# Patient Record
Sex: Male | Born: 1945 | Race: White | Hispanic: No | Smoking: Former smoker
Health system: Southern US, Community
[De-identification: ages and names within clinical notes are randomized; demographics above are authoritative.]

## PROBLEM LIST (undated history)

## (undated) DIAGNOSIS — C801 Malignant (primary) neoplasm, unspecified: Secondary | ICD-10-CM

## (undated) DIAGNOSIS — K5730 Diverticulosis of large intestine without perforation or abscess without bleeding: Secondary | ICD-10-CM

## (undated) DIAGNOSIS — K219 Gastro-esophageal reflux disease without esophagitis: Secondary | ICD-10-CM

## (undated) DIAGNOSIS — F32A Depression, unspecified: Secondary | ICD-10-CM

## (undated) DIAGNOSIS — H409 Unspecified glaucoma: Secondary | ICD-10-CM

## (undated) DIAGNOSIS — F329 Major depressive disorder, single episode, unspecified: Secondary | ICD-10-CM

## (undated) DIAGNOSIS — G4700 Insomnia, unspecified: Secondary | ICD-10-CM

## (undated) DIAGNOSIS — C4491 Basal cell carcinoma of skin, unspecified: Secondary | ICD-10-CM

## (undated) DIAGNOSIS — H269 Unspecified cataract: Secondary | ICD-10-CM

## (undated) DIAGNOSIS — K602 Anal fissure, unspecified: Secondary | ICD-10-CM

## (undated) DIAGNOSIS — K449 Diaphragmatic hernia without obstruction or gangrene: Secondary | ICD-10-CM

## (undated) DIAGNOSIS — T7840XA Allergy, unspecified, initial encounter: Secondary | ICD-10-CM

## (undated) DIAGNOSIS — K589 Irritable bowel syndrome without diarrhea: Secondary | ICD-10-CM

## (undated) DIAGNOSIS — F419 Anxiety disorder, unspecified: Secondary | ICD-10-CM

## (undated) DIAGNOSIS — K648 Other hemorrhoids: Secondary | ICD-10-CM

## (undated) HISTORY — PX: WRIST SURGERY: SHX841

## (undated) HISTORY — PX: BASAL CELL CARCINOMA EXCISION: SHX1214

## (undated) HISTORY — DX: Insomnia, unspecified: G47.00

## (undated) HISTORY — DX: Unspecified cataract: H26.9

## (undated) HISTORY — DX: Diverticulosis of large intestine without perforation or abscess without bleeding: K57.30

## (undated) HISTORY — DX: Anal fissure, unspecified: K60.2

## (undated) HISTORY — DX: Irritable bowel syndrome, unspecified: K58.9

## (undated) HISTORY — DX: Basal cell carcinoma of skin, unspecified: C44.91

## (undated) HISTORY — PX: TONSILLECTOMY AND ADENOIDECTOMY: SUR1326

## (undated) HISTORY — PX: UPPER GASTROINTESTINAL ENDOSCOPY: SHX188

## (undated) HISTORY — DX: Anxiety disorder, unspecified: F41.9

## (undated) HISTORY — DX: Major depressive disorder, single episode, unspecified: F32.9

## (undated) HISTORY — DX: Other hemorrhoids: K64.8

## (undated) HISTORY — DX: Unspecified glaucoma: H40.9

## (undated) HISTORY — DX: Depression, unspecified: F32.A

## (undated) HISTORY — DX: Gastro-esophageal reflux disease without esophagitis: K21.9

## (undated) HISTORY — DX: Diaphragmatic hernia without obstruction or gangrene: K44.9

## (undated) HISTORY — PX: APPENDECTOMY: SHX54

## (undated) HISTORY — DX: Allergy, unspecified, initial encounter: T78.40XA

## (undated) HISTORY — DX: Malignant (primary) neoplasm, unspecified: C80.1

---

## 1995-03-23 HISTORY — PX: ANAL SPHINCTEROTOMY: SHX1140

## 1999-04-23 HISTORY — PX: ESOPHAGOGASTRODUODENOSCOPY: SHX1529

## 1999-04-23 HISTORY — PX: COLONOSCOPY: SHX174

## 2001-05-22 ENCOUNTER — Encounter: Payer: Self-pay | Admitting: Family Medicine

## 2001-05-22 LAB — CONVERTED CEMR LAB: PSA: 2 ng/mL

## 2002-07-22 ENCOUNTER — Encounter: Payer: Self-pay | Admitting: Family Medicine

## 2002-07-22 LAB — CONVERTED CEMR LAB: PSA: 1.5 ng/mL

## 2003-06-06 ENCOUNTER — Encounter: Payer: Self-pay | Admitting: Family Medicine

## 2003-06-06 ENCOUNTER — Encounter: Admission: RE | Admit: 2003-06-06 | Discharge: 2003-06-06 | Payer: Self-pay | Admitting: Family Medicine

## 2003-07-23 ENCOUNTER — Encounter: Payer: Self-pay | Admitting: Family Medicine

## 2003-07-23 LAB — CONVERTED CEMR LAB: PSA: 1.8 ng/mL

## 2003-10-13 ENCOUNTER — Encounter: Payer: Self-pay | Admitting: Internal Medicine

## 2004-08-09 ENCOUNTER — Ambulatory Visit: Payer: Self-pay | Admitting: Family Medicine

## 2004-08-24 ENCOUNTER — Encounter: Admission: RE | Admit: 2004-08-24 | Discharge: 2004-08-24 | Payer: Self-pay | Admitting: Orthopedic Surgery

## 2004-09-27 HISTORY — PX: BICEPT TENODESIS: SHX5116

## 2004-11-20 ENCOUNTER — Encounter: Payer: Self-pay | Admitting: Family Medicine

## 2004-12-02 ENCOUNTER — Ambulatory Visit: Payer: Self-pay | Admitting: Family Medicine

## 2004-12-08 ENCOUNTER — Ambulatory Visit: Payer: Self-pay | Admitting: Family Medicine

## 2006-04-11 ENCOUNTER — Ambulatory Visit: Payer: Self-pay | Admitting: Internal Medicine

## 2006-04-22 ENCOUNTER — Encounter: Payer: Self-pay | Admitting: Family Medicine

## 2006-05-03 ENCOUNTER — Ambulatory Visit: Payer: Self-pay | Admitting: Family Medicine

## 2006-05-08 ENCOUNTER — Ambulatory Visit: Payer: Self-pay | Admitting: Family Medicine

## 2006-05-22 ENCOUNTER — Encounter: Payer: Self-pay | Admitting: Family Medicine

## 2006-05-22 LAB — CONVERTED CEMR LAB: PSA: 1.97 ng/mL

## 2006-05-24 ENCOUNTER — Ambulatory Visit: Payer: Self-pay | Admitting: Family Medicine

## 2006-05-26 ENCOUNTER — Ambulatory Visit: Payer: Self-pay | Admitting: Internal Medicine

## 2006-06-08 ENCOUNTER — Ambulatory Visit: Payer: Self-pay | Admitting: Family Medicine

## 2006-11-21 ENCOUNTER — Ambulatory Visit: Payer: Self-pay | Admitting: Family Medicine

## 2006-11-29 ENCOUNTER — Ambulatory Visit: Payer: Self-pay | Admitting: Family Medicine

## 2006-12-08 ENCOUNTER — Ambulatory Visit: Payer: Self-pay | Admitting: Family Medicine

## 2006-12-22 ENCOUNTER — Ambulatory Visit: Payer: Self-pay | Admitting: Family Medicine

## 2006-12-23 ENCOUNTER — Encounter: Payer: Self-pay | Admitting: Family Medicine

## 2006-12-23 DIAGNOSIS — K5730 Diverticulosis of large intestine without perforation or abscess without bleeding: Secondary | ICD-10-CM | POA: Insufficient documentation

## 2006-12-23 DIAGNOSIS — K589 Irritable bowel syndrome without diarrhea: Secondary | ICD-10-CM | POA: Insufficient documentation

## 2006-12-23 DIAGNOSIS — E782 Mixed hyperlipidemia: Secondary | ICD-10-CM

## 2007-01-03 ENCOUNTER — Ambulatory Visit: Payer: Self-pay | Admitting: Family Medicine

## 2007-01-18 ENCOUNTER — Ambulatory Visit: Payer: Self-pay | Admitting: Family Medicine

## 2007-01-23 ENCOUNTER — Ambulatory Visit: Payer: Self-pay | Admitting: Internal Medicine

## 2007-01-23 LAB — CONVERTED CEMR LAB
Basophils Absolute: 0.1 10*3/uL (ref 0.0–0.1)
Basophils Relative: 1.3 % — ABNORMAL HIGH (ref 0.0–1.0)
Creatinine, Ser: 1 mg/dL (ref 0.4–1.5)
Crystals: NEGATIVE
HCT: 42.8 % (ref 39.0–52.0)
Hemoglobin: 15.1 g/dL (ref 13.0–17.0)
MCHC: 35.3 g/dL (ref 30.0–36.0)
Neutrophils Relative %: 70.8 % (ref 43.0–77.0)
RBC: 4.85 M/uL (ref 4.22–5.81)
RDW: 13.3 % (ref 11.5–14.6)
Specific Gravity, Urine: <=1.005 (ref 1.000–1.03)
Total Protein, Urine: NEGATIVE mg/dL
WBC: 8.5 10*3/uL (ref 4.5–10.5)
pH: 6 (ref 5.0–8.0)

## 2007-01-24 ENCOUNTER — Ambulatory Visit (HOSPITAL_COMMUNITY): Admission: RE | Admit: 2007-01-24 | Discharge: 2007-01-24 | Payer: Self-pay | Admitting: Internal Medicine

## 2007-02-06 ENCOUNTER — Ambulatory Visit: Payer: Self-pay | Admitting: Family Medicine

## 2007-02-06 ENCOUNTER — Telehealth (INDEPENDENT_AMBULATORY_CARE_PROVIDER_SITE_OTHER): Payer: Self-pay | Admitting: *Deleted

## 2007-02-06 LAB — CONVERTED CEMR LAB
Bilirubin Urine: NEGATIVE
Specific Gravity, Urine: <1.005
Urobilinogen, UA: 0.2
WBC Urine, dipstick: NEGATIVE
pH: 6

## 2007-02-08 ENCOUNTER — Ambulatory Visit: Payer: Self-pay | Admitting: Internal Medicine

## 2007-06-21 ENCOUNTER — Ambulatory Visit: Payer: Self-pay | Admitting: Family Medicine

## 2007-06-21 LAB — CONVERTED CEMR LAB
Chloride: 103 meq/L (ref 96–112)
Eosinophils Absolute: 0.1 10*3/uL (ref 0.0–0.6)
Eosinophils Relative: 1.7 % (ref 0.0–5.0)
GFR calc non Af Amer: 80 mL/min
Glucose, Bld: 79 mg/dL (ref 70–99)
HCT: 42.5 % (ref 39.0–52.0)
Hemoglobin: 14.5 g/dL (ref 13.0–17.0)
Lymphocytes Relative: 23.1 % (ref 12.0–46.0)
MCV: 92.7 fL (ref 78.0–100.0)
Neutro Abs: 5.3 10*3/uL (ref 1.4–7.7)
Neutrophils Relative %: 67.5 % (ref 43.0–77.0)
RBC: 4.59 M/uL (ref 4.22–5.81)
Sodium: 142 meq/L (ref 135–145)
TSH: 1.05 microintl units/mL (ref 0.35–5.50)
WBC: 7.8 10*3/uL (ref 4.5–10.5)

## 2007-06-22 ENCOUNTER — Encounter: Payer: Self-pay | Admitting: Family Medicine

## 2007-06-25 ENCOUNTER — Ambulatory Visit: Payer: Self-pay | Admitting: Family Medicine

## 2007-07-20 ENCOUNTER — Ambulatory Visit: Payer: Self-pay | Admitting: Family Medicine

## 2007-07-20 LAB — FECAL OCCULT BLOOD, GUAIAC: Fecal Occult Blood: NEGATIVE

## 2007-07-23 ENCOUNTER — Encounter (INDEPENDENT_AMBULATORY_CARE_PROVIDER_SITE_OTHER): Payer: Self-pay | Admitting: *Deleted

## 2007-12-15 DIAGNOSIS — K449 Diaphragmatic hernia without obstruction or gangrene: Secondary | ICD-10-CM | POA: Insufficient documentation

## 2007-12-15 DIAGNOSIS — K319 Disease of stomach and duodenum, unspecified: Secondary | ICD-10-CM

## 2007-12-15 DIAGNOSIS — F411 Generalized anxiety disorder: Secondary | ICD-10-CM | POA: Insufficient documentation

## 2007-12-15 DIAGNOSIS — Z9089 Acquired absence of other organs: Secondary | ICD-10-CM

## 2007-12-24 ENCOUNTER — Ambulatory Visit: Payer: Self-pay | Admitting: Family Medicine

## 2007-12-25 ENCOUNTER — Encounter: Payer: Self-pay | Admitting: Family Medicine

## 2008-03-03 ENCOUNTER — Ambulatory Visit: Payer: Self-pay | Admitting: Family Medicine

## 2008-06-30 ENCOUNTER — Ambulatory Visit: Payer: Self-pay | Admitting: Family Medicine

## 2008-07-01 LAB — CONVERTED CEMR LAB
AST: 21 units/L (ref 0–37)
Alkaline Phosphatase: 46 units/L (ref 39–117)
Basophils Absolute: 0 10*3/uL (ref 0.0–0.1)
Chloride: 105 meq/L (ref 96–112)
Eosinophils Absolute: 0.2 10*3/uL (ref 0.0–0.7)
Eosinophils Relative: 3 % (ref 0.0–5.0)
GFR calc non Af Amer: 80 mL/min
HDL: 33.2 mg/dL — ABNORMAL LOW (ref >39.0)
MCHC: 34.2 g/dL (ref 30.0–36.0)
MCV: 92.5 fL (ref 78.0–100.0)
Neutrophils Relative %: 66.6 % (ref 43.0–77.0)
Platelets: 236 10*3/uL (ref 150–400)
Potassium: 4.4 meq/L (ref 3.5–5.1)
RDW: 13.2 % (ref 11.5–14.6)
Sodium: 141 meq/L (ref 135–145)
Total Bilirubin: 0.7 mg/dL (ref 0.3–1.2)
VLDL: 14 mg/dL (ref 0–40)
WBC: 6.9 10*3/uL (ref 4.5–10.5)

## 2008-07-02 ENCOUNTER — Ambulatory Visit: Payer: Self-pay | Admitting: Family Medicine

## 2008-07-03 ENCOUNTER — Encounter: Payer: Self-pay | Admitting: Family Medicine

## 2008-07-04 ENCOUNTER — Encounter: Payer: Self-pay | Admitting: Family Medicine

## 2008-09-02 ENCOUNTER — Encounter: Payer: Self-pay | Admitting: Family Medicine

## 2008-09-02 HISTORY — PX: MOHS SURGERY: SUR867

## 2008-10-08 ENCOUNTER — Encounter (INDEPENDENT_AMBULATORY_CARE_PROVIDER_SITE_OTHER): Payer: Self-pay | Admitting: *Deleted

## 2008-11-17 ENCOUNTER — Telehealth: Payer: Self-pay | Admitting: Family Medicine

## 2008-11-27 ENCOUNTER — Ambulatory Visit: Payer: Self-pay | Admitting: Internal Medicine

## 2008-12-11 ENCOUNTER — Ambulatory Visit: Payer: Self-pay | Admitting: Internal Medicine

## 2008-12-12 ENCOUNTER — Telehealth: Payer: Self-pay | Admitting: Internal Medicine

## 2008-12-31 ENCOUNTER — Telehealth: Payer: Self-pay | Admitting: Family Medicine

## 2009-01-01 ENCOUNTER — Ambulatory Visit: Payer: Self-pay | Admitting: Family Medicine

## 2009-01-01 LAB — CONVERTED CEMR LAB
Bacteria, UA: 0
Bilirubin Urine: NEGATIVE
Glucose, Urine, Semiquant: NEGATIVE
Ketones, urine, test strip: NEGATIVE
Protein, U semiquant: NEGATIVE
RBC / HPF: 0
Urobilinogen, UA: 0.2
WBC, UA: 0 cells/hpf
pH: 6

## 2009-01-02 ENCOUNTER — Encounter (INDEPENDENT_AMBULATORY_CARE_PROVIDER_SITE_OTHER): Payer: Self-pay | Admitting: Internal Medicine

## 2009-05-02 DIAGNOSIS — C4491 Basal cell carcinoma of skin, unspecified: Secondary | ICD-10-CM

## 2009-05-02 HISTORY — DX: Basal cell carcinoma of skin, unspecified: C44.91

## 2009-05-11 ENCOUNTER — Encounter: Payer: Self-pay | Admitting: Family Medicine

## 2009-05-12 ENCOUNTER — Ambulatory Visit: Payer: Self-pay | Admitting: Family Medicine

## 2009-05-21 ENCOUNTER — Encounter: Payer: Self-pay | Admitting: Family Medicine

## 2009-05-24 ENCOUNTER — Telehealth: Payer: Self-pay | Admitting: Internal Medicine

## 2009-05-25 ENCOUNTER — Encounter: Payer: Self-pay | Admitting: Family Medicine

## 2009-06-22 ENCOUNTER — Ambulatory Visit: Payer: Self-pay | Admitting: Family Medicine

## 2009-06-22 DIAGNOSIS — R5381 Other malaise: Secondary | ICD-10-CM

## 2009-06-22 DIAGNOSIS — R5383 Other fatigue: Secondary | ICD-10-CM

## 2009-06-23 ENCOUNTER — Ambulatory Visit: Payer: Self-pay | Admitting: Family Medicine

## 2009-06-24 LAB — CONVERTED CEMR LAB
Albumin: 3.8 g/dL (ref 3.5–5.2)
Alkaline Phosphatase: 49 units/L (ref 39–117)
Basophils Absolute: 0 10*3/uL (ref 0.0–0.1)
Calcium: 8.8 mg/dL (ref 8.4–10.5)
Creatinine, Ser: 1.2 mg/dL (ref 0.4–1.5)
Eosinophils Absolute: 0.2 10*3/uL (ref 0.0–0.7)
Eosinophils Relative: 3.5 % (ref 0.0–5.0)
GFR calc non Af Amer: 64.61 mL/min (ref >60)
Glucose, Bld: 82 mg/dL (ref 70–99)
HCT: 42.9 % (ref 39.0–52.0)
HDL: 32.1 mg/dL — ABNORMAL LOW (ref >39.00)
Iron: 108 ug/dL (ref 42–165)
Lymphs Abs: 1.7 10*3/uL (ref 0.7–4.0)
MCHC: 34.2 g/dL (ref 30.0–36.0)
MCV: 93.6 fL (ref 78.0–100.0)
Monocytes Absolute: 0.5 10*3/uL (ref 0.1–1.0)
Neutrophils Relative %: 64.9 % (ref 43.0–77.0)
Platelets: 237 10*3/uL (ref 150.0–400.0)
RDW: 13.3 % (ref 11.5–14.6)
Sodium: 137 meq/L (ref 135–145)
TSH: 0.99 microintl units/mL (ref 0.35–5.50)
Total Protein: 6.6 g/dL (ref 6.0–8.3)
Triglycerides: 80 mg/dL (ref 0.0–149.0)
VLDL: 16 mg/dL (ref 0.0–40.0)
WBC: 7 10*3/uL (ref 4.5–10.5)

## 2009-07-06 ENCOUNTER — Ambulatory Visit: Payer: Self-pay | Admitting: Family Medicine

## 2009-07-06 ENCOUNTER — Telehealth: Payer: Self-pay | Admitting: Family Medicine

## 2009-07-06 DIAGNOSIS — E559 Vitamin D deficiency, unspecified: Secondary | ICD-10-CM | POA: Insufficient documentation

## 2009-11-25 ENCOUNTER — Telehealth: Payer: Self-pay | Admitting: Family Medicine

## 2010-01-29 ENCOUNTER — Ambulatory Visit: Payer: Self-pay | Admitting: Family Medicine

## 2010-03-25 ENCOUNTER — Encounter (INDEPENDENT_AMBULATORY_CARE_PROVIDER_SITE_OTHER): Payer: Self-pay | Admitting: *Deleted

## 2010-09-08 ENCOUNTER — Encounter: Payer: Self-pay | Admitting: Family Medicine

## 2010-09-21 NOTE — Letter (Signed)
Summary: Nadara Eaton letter  [place] [facility]  [address] [place], [place] [address]   Phone: [phone]  Fax: [phone]       03/25/2010 MRN: [id number]  JAHMAI FINELLI [address] [place], [place]  [address]  Dear Mr. [facility] [place] and [place] announce the retirement of Arta Silence, M.D., from full-time practice at the [facility] office effective February 18, 2010 and his plans of returning part-time.  It is important to Dr. Hetty Ely and to our practice that you understand that [facility] has seven physicians in our office for your health care needs.  We will continue to offer the same exceptional care that you have today.    Dr. Hetty Ely has spoken to many of you about his plans for retirement and returning part-time in the fall.   We will continue to work with you through the transition to schedule appointments for you in the office and meet the high standards that [facility] is committed to.   Again, it is with great pleasure that we share the news that Dr. Hetty Ely will return to [facility] at [facility] in October of 2011 with a reduced schedule.    If you have any questions, or would like to request an appointment with one of our physicians, please call us at [phone] and press the option for Scheduling an appointment.  We take pleasure in providing you with excellent patient care and look forward to seeing you at your next office visit.  Our [facility] Physicians are:  Tillman Abide, M.D. Laurita Quint, M.D. Roxy Manns, M.D. Kerby Nora, M.D. Hannah Beat, M.D. Ruthe Mannan, M.D. We proudly welcomed Raechel Ache, M.D. and Eustaquio Boyden, M.D. to the practice in July/August 2011.  Sincerely,  [facility]

## 2010-09-21 NOTE — Assessment & Plan Note (Signed)
Summary: COUGH,BODY ACHES,DRAINAGE/CLE   Vital Signs:  Patient profile:   65 year old male Height:      70 inches Weight:      167.0 pounds BMI:     24.05 Temp:     98.2 degrees F oral Pulse rate:   68 / minute Pulse rhythm:   regular BP sitting:   128 / 80  (left arm) Cuff size:   regular  Vitals Entered By: Benny Lennert CMA Duncan Dull) (January 29, 2010 3:30 PM)  History of Present Illness: Chief complaint cough,body ache,and drainage   HAs quit quit smoking cigars.   On  allegra for allergies. Remotely used nasal steroid , minimal response.   Acute Visit History:      The patient complains of cough, nasal discharge, and sinus problems.  These symptoms began 5 days ago.  He denies chest pain, earache, fever, and headache.  Other comments include: body ache, fatigue post nasal drip  .        The character of the cough is described as nonproductive.  There is no history of wheezing or sleep interference associated with his cough.        He complains of sinus pressure, teeth aching, and ears being blocked.        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 1 Daily By Mouth 2)  Viagra 100 Mg Tabs (Sildenafil Citrate) .Marland Kitchen.. 1 Hour Before Sexual Activvity 3)  Fexofenadine Hcl 180 Mg Tabs (Fexofenadine Hcl) .Marland Kitchen.. 1 Daily By Mouth As Needed 4)  Azithromycin 250 Mg Tabs (Azithromycin) .... 2 Tab By Mouth X 1 Then 1 Tab By Mouth Daily  Fill If Not Improving in 20-4 Days, Void After 02/19/2010 5)  Fluticasone Propionate 50 Mcg/act Susp (Fluticasone Propionate) .... 2 Spray Per Nostrl Daily  Allergies: 1)  ! Penicillin V Potassium (Penicillin V Potassium) 2)  ! Erythromycin Base (Erythromycin Base) 3)  ! Librax (Clidinium-Chlordiazepoxide) 4)  ! Levbid (Hyoscyamine Sulfate) 5)  ! Shrimp // Crab Information systems manager)  Past History:  Past medical, surgical, family and social histories (including risk factors) reviewed, and no changes noted (except as noted below).  Past Medical History: Reviewed history from 01/18/2007 and no changes required. Diverticulosis, colon  Past Surgical History: Reviewed history from 06/22/2009 and no changes required. R Wrist Surg 11yoa Tonsillectomy RemoteAppy Remote Anal Sphincterotomy Fistulotomy 03/1995 Colonoscopy Divertics Int Hemms  04/1999            5 EGD Gerd w/Esophagitis and Stricture 04/1999 R Shoulder Arthgroscopy/Decompr  (Dr August Saucer)  09/27/2004 Colonoscopy Divertics  Int Hemms   10/13/2003       10 BCC Excision  L Supratip  of Nose with flap ([facility]) 09/02/2008 Colonoscopy Severe Divertics/Sig No Polyps Int Hemms  (Dr Perry)--12/11/2008--next in 5 yrs  Family History: Reviewed history from 07/06/2009 and no changes required. Father dec 92  Pneumonia Mother dec 92 Stroke HTN Sister A 70  Back trouble Chronic pain  Social History: Reviewed history from 12/22/2006 and no changes required. Occupation: [employer]  Retired 1997 Married 21+ yrs Divorced 1997   1 son Alcohol use-yes Drug use-no Former Smoker, cigar  Review of Systems General:  Denies fatigue  and fever. CV:  Denies chest pain or discomfort. Resp:  Denies shortness of breath. GI:  Denies abdominal pain.  Physical Exam  General:  Well-developed,well-nourished,in no acute distress; alert,appropriate and cooperative throughout examination Ears:  External ear exam shows no significant lesions or deformities.  Otoscopic examination reveals clear canals, tympanic membranes are intact bilaterally without bulging, retraction, inflammation or discharge. Hearing is grossly normal bilaterally. Nose:  nasal dischargemucosal pallor.   Mouth:  Oral mucosa and oropharynx without lesions or exudates.  Teeth in good repair. Neck:  no carotid bruit or thyromegaly no cervical or supraclavicular lymphadenopathy  Lungs:  Normal respiratory effort, chest expands symmetrically. Lungs are clear to auscultation, no crackles or wheezes. Heart:  Normal rate and regular rhythm. S1 and S2 normal without gallop, murmur, click, rub or other extra sounds. Pulses:  R and L carotid,radial,femoral,dorsalis pedis and posterior tibial pulses are full and equal bilaterally Extremities:  No clubbing, cyanosis, edema, or deformity noted with normal full range of motion of all joints.     Impression & Recommendations:  Problem # 1:  URI (ICD-465.9)  His updated medication list for this problem includes:    Fexofenadine Hcl 180 Mg Tabs (Fexofenadine hcl) .Marland Kitchen... 1 daily by mouth as needed  Instructed on symptomatic treatment. Call if symptoms persist or worsen.  If not improving as expected..fill antibitoic for possible bacterial superinfection.   Problem # 2:  ALLERGY, ENVIRONMENTAL/PERENNIAL (ICD-995.3) Add fluticasone to allegra for possible allergy component.   Complete Medication List: 1)  Nexium 40 Mg Cpdr (Esomeprazole magnesium) .Marland Kitchen.. 1 daily by mouth 2)  Viagra 100 Mg Tabs (Sildenafil citrate) .Marland Kitchen.. 1 hour before sexual activvity 3)  Fexofenadine Hcl 180 Mg Tabs (Fexofenadine hcl) .Marland Kitchen.. 1 daily by mouth as  needed 4)  Azithromycin 250 Mg Tabs (Azithromycin) .... 2 tab by mouth x 1 then 1 tab by mouth daily  fill if not improving in 20-4 days, void after 02/19/2010 5)  Fluticasone Propionate 50 Mcg/act Susp (Fluticasone propionate) .... 2 spray per nostrl daily  Patient Instructions: 1)  Start nasal steroid spray at bedtime. 2)   Mucinex during the day. 3)   Cough suppressant for cough at night. 4)   If not improving in 3-4 days may fill antibiotics.  Prescriptions: FLUTICASONE PROPIONATE 50 MCG/ACT SUSP (FLUTICASONE PROPIONATE) 2 spray per nostrl daily  #1 x 0   Entered and Authorized by:   Kerby Nora MD   Signed by:   Kerby Nora MD on 01/29/2010   Method used:   Electronically to        [facility] [phone]* (retail)       [address] [place], [place]  [address]       Ph: [phone] or [phone]       Fax: [phone]   RxID:   [id number] AZITHROMYCIN 250 MG TABS (AZITHROMYCIN) 2 tab  by mouth x 1 then 1 tab by mouth daily  Fill if not improving in 20-4 days, VOID after 02/19/2010  #6 x 0   Entered and Authorized by:   Kerby Nora MD   Signed by:   Kerby Nora MD on 01/29/2010   Method used:   Print then Give to Patient   RxID:   [id number]   Current Allergies (reviewed today): ! PENICILLIN V POTASSIUM (PENICILLIN V POTASSIUM) ! ERYTHROMYCIN BASE (ERYTHROMYCIN BASE) ! LIBRAX (CLIDINIUM-CHLORDIAZEPOXIDE) ! LEVBID (HYOSCYAMINE SULFATE) ! SHRIMP // CRAB (FLAVORING AGENT)

## 2010-09-21 NOTE — Progress Notes (Signed)
Summary: Thumping sound in ear  Phone Note Call from Patient Call back at Home Phone [phone]   Caller: Patient Call For: Shaune Leeks MD Summary of Call: Patient complaines of left ear pain.  On and off for the last few weeks, feels like a motor running in his ear.  This morning his ear has a thumping sound inside and it seems to be getting worse.  No pain but it feels like it is getting sore.  Was seen at ENT back in October and everything checked out ok.  He has problems with his sinuses but he says this is not coming from his sinuses.  Please advise.   Initial call taken by: Linde Gillis CMA Duncan Dull),  November 25, 2009 8:39 AM  Follow-up for Phone Call        Seeing ENT again might be the thing to do. Follow-up by: Shaune Leeks MD,  November 25, 2009 9:11 AM  Additional Follow-up for Phone Call Additional follow up Details #1::        Patient advised as instructed.  He has Medicare and a supplement and doesn't think he needs a referral but will call us back if he does. Additional Follow-up by: Linde Gillis CMA Duncan Dull),  November 25, 2009 9:23 AM

## 2010-10-25 ENCOUNTER — Encounter: Payer: Self-pay | Admitting: Family Medicine

## 2010-10-25 ENCOUNTER — Other Ambulatory Visit: Payer: Self-pay | Admitting: Family Medicine

## 2010-10-25 ENCOUNTER — Encounter (INDEPENDENT_AMBULATORY_CARE_PROVIDER_SITE_OTHER): Payer: Self-pay | Admitting: *Deleted

## 2010-10-25 ENCOUNTER — Other Ambulatory Visit (INDEPENDENT_AMBULATORY_CARE_PROVIDER_SITE_OTHER): Payer: 59

## 2010-10-25 DIAGNOSIS — R109 Unspecified abdominal pain: Secondary | ICD-10-CM

## 2010-10-25 DIAGNOSIS — K5730 Diverticulosis of large intestine without perforation or abscess without bleeding: Secondary | ICD-10-CM

## 2010-10-25 DIAGNOSIS — E559 Vitamin D deficiency, unspecified: Secondary | ICD-10-CM

## 2010-10-25 DIAGNOSIS — E782 Mixed hyperlipidemia: Secondary | ICD-10-CM

## 2010-10-25 DIAGNOSIS — Z125 Encounter for screening for malignant neoplasm of prostate: Secondary | ICD-10-CM

## 2010-10-25 DIAGNOSIS — E785 Hyperlipidemia, unspecified: Secondary | ICD-10-CM

## 2010-10-25 LAB — HEPATIC FUNCTION PANEL
ALT: 20 U/L (ref 0–53)
AST: 26 U/L (ref 0–37)
Alkaline Phosphatase: 52 U/L (ref 39–117)
Bilirubin, Direct: 0.1 mg/dL (ref 0.0–0.3)
Total Bilirubin: 0.4 mg/dL (ref 0.3–1.2)
Total Protein: 6.7 g/dL (ref 6.0–8.3)

## 2010-10-25 LAB — BASIC METABOLIC PANEL
CO2: 32 mEq/L (ref 19–32)
Chloride: 101 mEq/L (ref 96–112)
Creatinine, Ser: 1 mg/dL (ref 0.4–1.5)
Potassium: 4.3 mEq/L (ref 3.5–5.1)
Sodium: 138 mEq/L (ref 135–145)

## 2010-10-25 LAB — LIPID PANEL
HDL: 40.8 mg/dL (ref >39.00)
Total CHOL/HDL Ratio: 3
Triglycerides: 55 mg/dL (ref 0.0–149.0)
VLDL: 11 mg/dL (ref 0.0–40.0)

## 2010-10-25 LAB — CBC WITH DIFFERENTIAL/PLATELET
Basophils Absolute: 0 10*3/uL (ref 0.0–0.1)
Eosinophils Absolute: 0.2 10*3/uL (ref 0.0–0.7)
Lymphocytes Relative: 22.4 % (ref 12.0–46.0)
MCHC: 33.9 g/dL (ref 30.0–36.0)
MCV: 92.3 fl (ref 78.0–100.0)
Monocytes Absolute: 0.6 10*3/uL (ref 0.1–1.0)
Neutrophils Relative %: 68 % (ref 43.0–77.0)
Platelets: 314 10*3/uL (ref 150.0–400.0)
RDW: 14.3 % (ref 11.5–14.6)

## 2010-10-25 LAB — PSA: PSA: 2.75 ng/mL (ref 0.10–4.00)

## 2010-10-26 LAB — CONVERTED CEMR LAB: Vit D, 25-Hydroxy: 52 ng/mL (ref 30–89)

## 2010-10-28 ENCOUNTER — Encounter (INDEPENDENT_AMBULATORY_CARE_PROVIDER_SITE_OTHER): Payer: 59 | Admitting: Family Medicine

## 2010-10-28 ENCOUNTER — Encounter: Payer: Self-pay | Admitting: Family Medicine

## 2010-10-28 DIAGNOSIS — Z0000 Encounter for general adult medical examination without abnormal findings: Secondary | ICD-10-CM

## 2010-10-28 DIAGNOSIS — Z23 Encounter for immunization: Secondary | ICD-10-CM

## 2010-11-02 NOTE — Letter (Signed)
Summary: Nature conservation officer [employer] Wellness Visit Questionnaire   [employer] Annual Wellness Visit Questionnaire   Imported By: Beau Fanny 10/29/2010 10:12:14  _____________________________________________________________________  External Attachment:    Type:   Image     Comment:   External Document

## 2010-11-02 NOTE — Assessment & Plan Note (Signed)
Summary: MEDICARE CPX/ RBHQ   Vital Signs:  Patient profile:   65 year old male Weight:      169.50 pounds BMI:     24.41 Temp:     98.5 degrees F oral Pulse rate:   76 / minute Pulse rhythm:   regular BP sitting:   120 / 84  (left arm) Cuff size:   regular  Vitals Entered By: Sydell Axon LPN (October 27, 1608 2:07 PM) CC: 30 minute checkup, had a colonoscopy 04/10 25db HL: Left  500 hz: 25db 1000 hz: 25db 2000 hz: 25db 4000 hz: No Response Right  500 hz: 25db 1000 hz: 25db 2000 hz: 20db 4000 hz: No Response    History of Present Illness: Pt here for Comp Exam. He feels divertics bothers him some. Occas he has a throb, sometimes a quick pain.    Preventive Screening-Counseling & Management  Alcohol-Tobacco     Alcohol drinks/day: <1     Alcohol type: beer rarely     Smoking Status: quit     Year Quit: Cigars 2006     Passive Smoke Exposure: no  Caffeine-Diet-Exercise     Caffeine use/day: 5+     Does Patient Exercise: yes     Type of exercise: walks 2 miles     Times/week: 4  Problems Prior to Update: 1)  Vitamin D Deficiency  (ICD-268.9) 2)  Fatigue  (ICD-780.79) 3)  Luq Pain, Chronic  (ICD-789.02) 4)  Tendinitis, Nodule in R Palm  (ICD-726.90) 5)  Tonsillectomy and Adenoidectomy, Hx of  (ICD-V45.79) 6)  Peptic Stricture  (ICD-537.89) 7)  Hiatal Hernia  (ICD-553.3) 8)  Anxiety  (ICD-300.00) 9)  Health Maintenance Exam  (ICD-V70.0) 10)  Screening For Malignant Neoplasm, Prostate  (ICD-V76.44) 11)  Hx, Personal, Tobacco Use/cigars  (ICD-V15.82) 12)  Hyperlipidemia, Mixed  (ICD-272.2) 13)  Gerd w/ Stricture  (ICD-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 1 Daily By Mouth 2)  Viagra 100 Mg Tabs (Sildenafil Citrate) .Marland Kitchen.. 1 Hour Before Sexual Activvity 3)   Fexofenadine Hcl 180 Mg Tabs (Fexofenadine Hcl) .Marland Kitchen.. 1 Daily By Mouth As Needed 4)  Azithromycin 250 Mg Tabs (Azithromycin) .... 2 Tab By Mouth X 1 Then 1 Tab By Mouth Daily  Fill If Not Improving in 20-4 Days, Void After 02/19/2010 5)  Fluticasone Propionate 50 Mcg/act Susp (Fluticasone Propionate) .... 2 Spray Per Nostrl Daily  Current Medications (verified): 1)  Nexium 40 Mg Cpdr (Esomeprazole Magnesium) .Marland Kitchen.. 1 Daily By Mouth 2)  Viagra 100 Mg Tabs (Sildenafil Citrate) .Marland Kitchen.. 1 Hour Before Sexual Activvity 3)  Fexofenadine Hcl 180 Mg Tabs (Fexofenadine Hcl) .Marland Kitchen.. 1 Daily By Mouth As Needed 4)  Lumigan 0.01 % Soln (Bimatoprost) .... Use One Drop in Each Eye Daily  Allergies: 1)  ! Penicillin V Potassium (Penicillin V Potassium) 2)  ! Erythromycin Base (Erythromycin Base) 3)  ! Librax (Clidinium-Chlordiazepoxide) 4)  ! Levbid (Hyoscyamine Sulfate) 5)  ! Shrimp // Crab Information systems manager)  Past History:  Past Medical History: Last updated: 01/18/2007 Diverticulosis, colon  Past Surgical History: Last updated: 06/22/2009 R Wrist Surg 11yoa Tonsillectomy RemoteAppy Remote Anal Sphincterotomy Fistulotomy 03/1995 Colonoscopy Divertics Int Hemms  04/1999            5 EGD Gerd w/Esophagitis and Stricture 04/1999 R Shoulder Arthgroscopy/Decompr  (Dr August Saucer)  09/27/2004  Colonoscopy Divertics  Int Hemms   10/13/2003       10 BCC Excision  L Supratip of Nose with flap ([facility]) 09/02/2008 Colonoscopy Severe Divertics/Sig No Polyps Int Hemms  (Dr Perry)--12/11/2008--next in 5 yrs  Family History: Last updated: 10/28/2010 Father dec 92  Pneumonia Mother dec 92 Stroke HTN Sister A 71  Back trouble Chronic pain  Social History: Last updated: 01/29/2010 Occupation: [employer]  Retired 1997 Married 21+ yrs Divorced 1997   1 son Alcohol use-yes Drug use-no Former Smoker, cigar  Risk Factors: Alcohol Use: <1 (10/28/2010) Caffeine Use: 5+ (10/28/2010) Exercise: yes  (10/28/2010)  Risk Factors: Smoking Status: quit (10/28/2010) Passive Smoke Exposure: no (10/28/2010)  Family History: Father dec 92  Pneumonia Mother dec 92 Stroke HTN Sister A 71  Back trouble Chronic pain  Review of Systems General:  Denies chills, fatigue, fever, sweats, weakness, and weight loss. Eyes:  Denies blurring, discharge, and eye pain; recentr diagnosis of glaucoma with early cataracts.. ENT:  Complains of ringing in ears; denies decreased hearing, ear discharge, and earache; occas. CV:  Complains of palpitations; denies chest pain or discomfort, fainting, fatigue, shortness of breath with exertion, swelling of feet, and swelling of hands; very rare. Resp:  Denies cough, shortness of breath, and wheezing. GI:  Complains of indigestion; denies abdominal pain, bloody stools, change in bowel habits, constipation, dark tarry stools, diarrhea, loss of appetite, nausea, vomiting, vomiting blood, and yellowish skin color; rare if doesn't take Nexium,. GU:  Complains of nocturia; denies discharge, dysuria, and urinary frequency; chronic 2-3. MS:  Denies joint pain, low back pain, muscle aches, cramps, and stiffness. Derm:  Denies dryness, itching, and rash. Neuro:  Denies numbness, poor balance, tingling, and tremors.  Physical Exam  General:  Well-developed,well-nourished,in no acute distress; alert,appropriate and cooperative throughout examination Head:  Normocephalic and atraumatic without obvious abnormalities. No apparent alopecia or balding. Sinuses NT. Eyes:  Conjunctiva clear bilaterally.  Ears:  External ear exam shows no significant lesions or deformities.  Otoscopic examination reveals clear canals, tympanic membranes are intact bilaterally without bulging, retraction, inflammation or discharge. Hearing is grossly normal bilaterally. Nose:  External nasal examination shows no deformity or inflammation. Nasal mucosa are pink and moist without lesions or  exudates. Mouth:  Oral mucosa and oropharynx without lesions or exudates.  Teeth in good repair. Neck:  no carotid bruit or thyromegaly no cervical or supraclavicular lymphadenopathy  Chest Wall:  No deformities, masses, tenderness or gynecomastia noted. Breasts:  No masses or gynecomastia noted Lungs:  Normal respiratory effort, chest expands symmetrically. Lungs are clear to auscultation, no crackles or wheezes. Heart:  Normal rate and regular rhythm. S1 and S2 normal without gallop, murmur, click, rub or other extra sounds. Abdomen:  Bowel sounds positive,abdomen soft and minimally tender globally without masses, organomegaly or hernias noted. Rectal:  No external abnormalities noted. Normal sphincter tone. No rectal masses or tenderness. Mild dermatitis of the perirectal/buttock area. Genitalia:  Testes bilaterally descended without nodularity, tenderness or masses. No scrotal masses or lesions. No penis lesions or urethral discharge. Prostate:  Prostate gland firm and smooth, no enlargement, nodularity, tenderness, mass, asymmetry or induration. 30gms, right pole very slightly larger than the left. Msk:  No deformity or scoliosis noted of thoracic or lumbar spine.   Pulses:  R and L carotid,radial,femoral,dorsalis pedis and posterior tibial pulses are full and equal bilaterally Extremities:  No clubbing, cyanosis, edema, or deformity noted with normal full range of motion of all joints.  Neurologic:  No cranial nerve deficits noted. Station and gait are normal. Sensory, motor and coordinative functions appear intact. Skin:  Intact without suspicious lesions or rashes, mild dermatitis of the buttocks perirectally. Cervical Nodes:  No lymphadenopathy noted Inguinal Nodes:  No significant adenopathy Psych:  Cognition and judgment appear intact. Alert and cooperative with normal attention span and concentration. No apparent delusions, illusions, hallucinations   Impression &  Recommendations:  Problem # 1:  HEALTH MAINTENANCE EXAM (ICD-V70.0) Assessment Comment Only I have personally reviewed the Medicare Annual Wellness questionnaire and have noted 1.   The patient's medical and social history 2.   Their use of alcohol, tobacco or illicit drugs 3.   Their current medications and supplements 4.   The patient's functional ability including ADL's, fall risks, home safety risks and hearing or visual             impairment. Hearing minimally compromised. 5.   Diet and physical activities 6.   Evidence for depression or mood disorders  Problem # 2:  VITAMIN D DEFICIENCY (ICD-268.9) Assessment: Unchanged Adequate, cont curr tx.  Problem # 3:  IRRITABLE BOWEL SYNDROME (ICD-564.1) Assessment: Unchanged Is getting a better handle on his symptom complex. Cont with fiber and other interventions.  Problem # 4:  SCREENING FOR MALIGNANT NEOPLASM, PROSTATE (ICD-V76.44) Assessment: Unchanged Stable PSA and exam.  Problem # 5:  HYPERLIPIDEMIA, MIXED (ICD-272.2) Assessment: Unchanged Good nos. Cont curr lifestyle. Labs Reviewed: SGOT: 26 (10/25/2010)   SGPT: 20 (10/25/2010)   HDL:40.80 (10/25/2010), 32.10 (06/23/2009)  LDL:83 (10/25/2010), 104 (29/52/8413)  Chol:135 (10/25/2010), 152 (06/23/2009)  Trig:55.0 (10/25/2010), 80.0 (06/23/2009)  Complete Medication List: 1)  Nexium 40 Mg Cpdr (Esomeprazole magnesium) .Marland Kitchen.. 1 daily by mouth 2)  Viagra 100 Mg Tabs (Sildenafil citrate) .Marland Kitchen.. 1 hour before sexual activvity 3)  Fexofenadine Hcl 180 Mg Tabs (Fexofenadine hcl) .Marland Kitchen.. 1 daily by mouth as needed 4)  Lumigan 0.01 % Soln (Bimatoprost) .... Use one drop in each eye daily  Other Orders: Vision Screening (24401) Pneumococcal Vaccine (02725) Admin 1st Vaccine (36644) Admin 1st Vaccine [facility]) [phone])  Patient Instructions: 1)  RTC as needed.  2)  Discussed Zostavax today.   Orders Added: 1)  Vision Screening [99173] 2)  Pneumococcal Vaccine [90732] 3)  Admin  1st Vaccine [90471] 4)  Admin 1st Vaccine [facility]) [59563O] 5)  Est. Patient 65& > [75643]    Current Allergies (reviewed today): ! PENICILLIN V POTASSIUM (PENICILLIN V POTASSIUM) ! ERYTHROMYCIN BASE (ERYTHROMYCIN BASE) ! LIBRAX (CLIDINIUM-CHLORDIAZEPOXIDE) ! LEVBID (HYOSCYAMINE SULFATE) ! SHRIMP // CRAB (FLAVORING AGENT)    Pneumovax Vaccine    Vaccine Type: Pneumovax (Medicare)    Site: right deltoid    Mfr: Merck    Dose: 0.5 ml    Route: IM    Given by: Sydell Axon LPN    Exp. Date: 01/14/2012    Lot #: [id number]    VIS given: 07/27/09 version given October 28, 2010.

## 2010-11-02 NOTE — Letter (Signed)
Summary: [facility] Screening  [facility] Screening   Imported By: Beau Fanny 10/27/2010 09:28:12  _____________________________________________________________________  External Attachment:    Type:   Image     Comment:   External Document

## 2010-11-09 ENCOUNTER — Other Ambulatory Visit: Payer: Self-pay | Admitting: Family Medicine

## 2010-12-15 ENCOUNTER — Encounter: Payer: Self-pay | Admitting: Family Medicine

## 2010-12-15 LAB — HM PAP SMEAR

## 2010-12-16 ENCOUNTER — Ambulatory Visit (INDEPENDENT_AMBULATORY_CARE_PROVIDER_SITE_OTHER): Payer: 59 | Admitting: Family Medicine

## 2010-12-16 ENCOUNTER — Encounter: Payer: Self-pay | Admitting: Family Medicine

## 2010-12-16 DIAGNOSIS — G4700 Insomnia, unspecified: Secondary | ICD-10-CM

## 2010-12-16 DIAGNOSIS — F411 Generalized anxiety disorder: Secondary | ICD-10-CM

## 2010-12-16 MED ORDER — TRAZODONE HCL 50 MG PO TABS: ORAL_TABLET | ORAL | Status: DC

## 2010-12-16 NOTE — Progress Notes (Signed)
Anxiety and insomnia.   "I think it's a combination of depression and anxiety."  Had new relationship 7 months ago.  He's worried about the relationship.  She's working, he's retired.  He's introverted and she isn't.  "I'm worried about losing the relationship."  Waking after 3 hours.  Trouble getting back to sleep.  Sx going on for about 1 week.  He panics when the house is empty.  Retired '97 and 'I have a lot of time on my hands.'  'Little by little, I'm getting more isolated.'   He's frustrated and irritable but has not SI/HI.    He walks the dog but this is the main form of exercise.    Meds, vitals, and allergies reviewed.   ROS: See HPI.  Otherwise, noncontributory.  nad Affect appropriate. Judgement appears normal No TMG rrr

## 2010-12-16 NOTE — Assessment & Plan Note (Signed)
Stop benadryl, start trazodone and pt to schedule with counseling.  No SI/HI, he contracts for safety.  Call back as needed.

## 2010-12-16 NOTE — Assessment & Plan Note (Addendum)
Okay for outpatient fu.  Start trazodone and pt to check on counseling.  See above. >25 min spent with face to face with patient counseling.  We talked about exercise, volunteering (to get out of the house), avoiding etoh.

## 2010-12-16 NOTE — Patient Instructions (Signed)
See Shirlee Limerick about your referral before your leave today. Start the trazodone at night.  Try to exercise more and let me know how you are doing.  Take care.

## 2011-01-04 NOTE — Assessment & Plan Note (Signed)
[name]                                 ON-CALL NOTE   RAJ, LANDRESS                    MRN:          [id number]  DATE:01/20/2007                            DOB:          08-27-1945    TIME RECEIVED:  9:53 a.m.   He sees Dr. Hetty Ely.   PHONE NUMBER:  [phone].   Patient describes loose bowels and stomach cramps intermittently for the  past two months.  He saw Dr. Ermalene Searing in the clinic two days ago and was  diagnosed with diverticulitis.  He was placed on courses of Flagyl and  Cipro, and an appointment was made to see Dr. Marina Goodell in our GI department  on June 3.  He states that his symptoms really are no different, and he  wants to know what to do.  The pain is not particularly severe.  There  is no nausea or vomiting.  There is no fever.  My response is it may be  a bit early to see results from these antibiotics.  He should continue  on them through the weekend and follow up with Dr. Marina Goodell next week,  otherwise if he develops fever or if his pain worsens in any way, he  could call me back.     Tera Mater. Clent Ridges, MD  Electronically Signed    SAF/MedQ  DD: 01/20/2007  DT: 01/20/2007  Job #: [id number]

## 2011-01-04 NOTE — Assessment & Plan Note (Signed)
[facility]                         GASTROENTEROLOGY OFFICE NOTE   Mason [name], Mason [name]                    MRN:          [id number]  DATE:01/23/2007                            DOB:          Mar 14, 1946    REASON FOR CONSULTATION:  Multiple abdominal complaints.   HISTORY:  This is a 65 year old white male with a long history of  abdominal complaints consistent with irritable bowel syndrome.  He has  been evaluated in this office in the past for problems with irritable  bowel, as well as reflux disease.  He does have a family history of  colon cancer, and has undergone colonoscopy on several occasions.  His  last colonoscopy was performed October 13, 2003.  He was found to have  severe left-sided diverticulosis.  No other problems.  Previous upper  endoscopy in 2000 revealed mild esophagitis, and an asymptomatic peptic  stricture, as well as a hiatal hernia.  For his reflux, he has been  maintained on Nexium with good control of symptoms.  His chronic  irritable bowel complaints continue.  However, over the past 2 months he  has had different issues.  In particular, he has noticed increased noise  in his abdomen.  As well, significant lower abdominal discomfort  bilaterally.  His discomfort favors the left lower quadrant.  He has  also had a change in his bowel habits, which have been pencil thin,  occasionally loose.  Last month he was treated with a course of  ciprofloxacin, which seemed to help for a while.  However, symptoms  worsened recently, and last week he was placed on ciprofloxacin and  Flagyl.  He thinks this is helping the pain.  He also has some bladder  discomfort.  He has had a 10-pound weight loss since early April,  stating that he is afraid to eat, concerned that he may have loose  stools.  Food does not bring on pain, however.  No documented fevers,  though he had chills on one occasion.  He has not seen blood.  He is  accompanied today by a male companion.   PAST MEDICAL HISTORY:  1. Irritable bowel syndrome.  2. Diverticulosis.  3. Anxiety.  4. Status post appendectomy.  5. He has also had a tonsillectomy.   FAMILY HISTORY:  Colon cancer in his mother and 2 aunts.   SOCIAL HISTORY:  Divorced with 1 son.  Lives alone.  Attended college.  Retired from [employer].  Rarely smokes.  Rarely uses alcohol.   REVIEW OF SYSTEMS:  Fatigue.   PHYSICAL EXAMINATION:  Well-appearing male, in no acute distress.  His blood pressure is 120/78.  Heart rate is 60.  Weight is 164 pounds.  He is 5 feet 11 inches in height.  HEENT:  Sclerae anicteric.  Conjunctivae are pink.  Oral mucosa intact.  No adenopathy.  LUNGS:  Clear.  HEART:  Regular.  ABDOMEN:  Soft without tenderness, mass, or hernia.  Good bowel sounds  heard.  EXTREMITIES:  Without edema.   IMPRESSION:  1. This is a 65 year old gentleman with a history of irritable bowel  and diverticulosis who presents with intermittent problems with      abdominal pain, weight loss, and change in bowel habits.  I think      this most likely represents diverticulitis, and he has seen      transient improvement with antibiotic therapy.  However, he seems      to have relapsed recently with some improvement with recurrent      therapy.  2. History of reflux disease.  Asymptomatic on Nexium.   RECOMMENDATIONS:  1. Continue current course of ciprofloxacin and Flagyl.  2. Obtain CBC and urinalysis.  3. Schedule CT scan of the abdomen and pelvis to exclude complications      of diverticulitis.  4. Office followup in 2 weeks.     Wilhemina Bonito. Marina Goodell, MD  Electronically Signed    JNP/MedQ  DD: 01/23/2007  DT: 01/23/2007  Job #: [id number]   cc:   Arta Silence, MD

## 2011-01-04 NOTE — Assessment & Plan Note (Signed)
[facility]                         GASTROENTEROLOGY OFFICE NOTE   MATAEO, INGWERSEN                    MRN:          [id number]  DATE:02/08/2007                            DOB:          16-Nov-1945    HISTORY:  Mr. Maisano presents today for followup. He was evaluated  January 23, 2007 for multiple abdominal complaints. He has a history of  chronic abdominal complaints, irritable bowel syndrome, and  diverticulosis. His recent problem was felt likely to represent mild  diverticulitis which had responded to antibiotic therapy. Patient was  found to be quite anxious and complained of increased intestinal gas and  hyperactive bowel sounds. We obtained a CBC and urinalysis which are  unremarkable. He completed a course of ciprofloxacin and Flagyl.  Contrast enhanced CT scan of the abdomen and pelvis performed January 24, 2007 revealed extensive diverticulosis with no evidence of  diverticulitis. He presents for followup at this time. He has also had  some complaints of increased urinary frequency and hematospermia for  which he saw Dr. Hetty Ely. He was felt to have prostatitis and placed on  a 21 day course of Bactrim. He presents today still concerned about  increased bowel sounds, increased gas, and weight loss. He is anxious  and has been having early awakening without the ability to go back to  sleep. He has no vomiting, no fevers, no bleeding, and no significant  pain.   PHYSICAL EXAMINATION:  Finds an anxious gentleman in no acute distress.  Blood pressure is 140/80, heart rate is 64 and regular, weight is 162.4  pounds.  HEENT: Sclera anicteric.  LUNGS: Clear.  HEART: Regular.  ABDOMEN: Soft without tenderness masses or hernia. Good bowel sounds  heard.   IMPRESSION:  1. Diverticulosis without diverticulitis.  2. Irritable bowel syndrome.  3. Nonspecific complaints of increasing intestinal gas.  4. Anxiety.   RECOMMENDATIONS:  As the  patient expressed concerns about occult  inflammatory bowel disease or occult colon cancer. I did offer him an  opportunity for repeat colonoscopy to address these concerns.  Colonoscopy three and a half years ago revealed diverticulosis. He does  have a family history of colon cancer. I tried to reassure him with  regards to his symptoms. I also offered him the opportunity to give  things a little more time to see if symptoms settle down. He has elected  to follow up with me in the office in about a month but knows to contact  the office in  the interim for problems. In terms of his genitourinary complaints, he  will continue to follow up with Dr. Hetty Ely.     Wilhemina Bonito. Marina Goodell, MD  Electronically Signed    JNP/MedQ  DD: 02/08/2007  DT: 02/08/2007  Job #: [id number]   cc:   Arta Silence, MD

## 2011-01-05 ENCOUNTER — Ambulatory Visit (INDEPENDENT_AMBULATORY_CARE_PROVIDER_SITE_OTHER): Payer: 59 | Admitting: Psychology

## 2011-01-05 DIAGNOSIS — F4323 Adjustment disorder with mixed anxiety and depressed mood: Secondary | ICD-10-CM

## 2011-02-02 ENCOUNTER — Ambulatory Visit (INDEPENDENT_AMBULATORY_CARE_PROVIDER_SITE_OTHER): Payer: 59 | Admitting: Psychology

## 2011-02-02 DIAGNOSIS — F4323 Adjustment disorder with mixed anxiety and depressed mood: Secondary | ICD-10-CM

## 2011-02-16 ENCOUNTER — Ambulatory Visit (INDEPENDENT_AMBULATORY_CARE_PROVIDER_SITE_OTHER): Payer: 59 | Admitting: Psychology

## 2011-02-16 DIAGNOSIS — F4323 Adjustment disorder with mixed anxiety and depressed mood: Secondary | ICD-10-CM

## 2011-03-16 ENCOUNTER — Ambulatory Visit (INDEPENDENT_AMBULATORY_CARE_PROVIDER_SITE_OTHER): Payer: 59 | Admitting: Psychology

## 2011-03-16 DIAGNOSIS — F331 Major depressive disorder, recurrent, moderate: Secondary | ICD-10-CM

## 2011-03-23 ENCOUNTER — Ambulatory Visit (INDEPENDENT_AMBULATORY_CARE_PROVIDER_SITE_OTHER): Payer: 59 | Admitting: Psychology

## 2011-03-23 DIAGNOSIS — F4323 Adjustment disorder with mixed anxiety and depressed mood: Secondary | ICD-10-CM

## 2011-04-01 ENCOUNTER — Ambulatory Visit (INDEPENDENT_AMBULATORY_CARE_PROVIDER_SITE_OTHER): Payer: 59 | Admitting: Family Medicine

## 2011-04-01 ENCOUNTER — Encounter: Payer: Self-pay | Admitting: Family Medicine

## 2011-04-01 VITALS — BP 142/82 | HR 72 | Temp 98.7°F | Wt 163.2 lb

## 2011-04-01 DIAGNOSIS — F329 Major depressive disorder, single episode, unspecified: Secondary | ICD-10-CM | POA: Insufficient documentation

## 2011-04-01 DIAGNOSIS — F32A Depression, unspecified: Secondary | ICD-10-CM | POA: Insufficient documentation

## 2011-04-01 MED ORDER — CITALOPRAM HYDROBROMIDE 20 MG PO TABS: 20.0000 mg | ORAL_TABLET | Freq: Every day | ORAL | Status: DC

## 2011-04-01 NOTE — Progress Notes (Signed)
Depressed Mood:  Sleep decreased/ Insomnia/Early awakening: yes Interest decreased in activities:yes Guilt or worthlessness: yes Energy decreased:yes Concentration difficulties:yes Appetite disturbance or weight loss: some weight loss Psychomotor retardation/agitation:no Suicidal thoughts:no Tearful.  In counseling with Dr. Laymond Purser.  They had talked about med therapy.   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  He isn't getting as much out of the work as prev. "I've been depressed before, but I was able to wear it out." Sx going on for months.   His relationship with a GF had prev ended.    PMH and SH reviewed  Meds, vitals, and allergies reviewed.   ROS: See HPI.  Otherwise negative.    NAD Speech fluent Judgement intact Affect wnl NCAT RRR CTAB No tremor

## 2011-04-01 NOTE — Patient Instructions (Signed)
Start the citalopram, take 1 a day.  Call me with an update in about 2 weeks, sooner if needed.  I'd like to see you back in about 1 month- OV.  Take care.

## 2011-04-03 ENCOUNTER — Encounter: Payer: Self-pay | Admitting: Family Medicine

## 2011-04-03 NOTE — Assessment & Plan Note (Signed)
Continue counseling.  We talked about options. No SI/HI.  He may benefit from medical treatment in addition to counseling.  Start celexa and call back as needed in meantime.  Okay for outpatient f/u.  We talked about benefits, side effects and timeline of effect with medicine.  If profound change in mood in the meantime, then notify the clinic. No manic history.  Appreciate help of Dr. Laymond Purser.  >25 min spent with face to face with patient, >50% counseling.

## 2011-04-04 ENCOUNTER — Telehealth: Payer: Self-pay | Admitting: *Deleted

## 2011-04-04 NOTE — Telephone Encounter (Signed)
He started the medicine but he was intolerant and stopped.  He wants to continue with counseling and I support this.  I added it to the intolerant list.

## 2011-04-04 NOTE — Telephone Encounter (Signed)
Pt states he was told to call today and report if having any problems with citalopram.  He is.  States this is causing insomnia and jitters.  He says he cant take this.  He's asking that you call him.

## 2011-04-06 ENCOUNTER — Ambulatory Visit (INDEPENDENT_AMBULATORY_CARE_PROVIDER_SITE_OTHER): Payer: 59 | Admitting: Psychology

## 2011-04-06 DIAGNOSIS — F331 Major depressive disorder, recurrent, moderate: Secondary | ICD-10-CM

## 2011-04-20 ENCOUNTER — Ambulatory Visit (INDEPENDENT_AMBULATORY_CARE_PROVIDER_SITE_OTHER): Payer: 59 | Admitting: Psychology

## 2011-04-20 DIAGNOSIS — F4323 Adjustment disorder with mixed anxiety and depressed mood: Secondary | ICD-10-CM

## 2011-05-04 ENCOUNTER — Ambulatory Visit (INDEPENDENT_AMBULATORY_CARE_PROVIDER_SITE_OTHER): Payer: 59 | Admitting: Psychology

## 2011-05-04 DIAGNOSIS — F4323 Adjustment disorder with mixed anxiety and depressed mood: Secondary | ICD-10-CM

## 2011-05-06 ENCOUNTER — Ambulatory Visit: Payer: 59 | Admitting: Family Medicine

## 2011-05-17 ENCOUNTER — Ambulatory Visit (INDEPENDENT_AMBULATORY_CARE_PROVIDER_SITE_OTHER): Payer: 59 | Admitting: *Deleted

## 2011-05-17 DIAGNOSIS — Z23 Encounter for immunization: Secondary | ICD-10-CM

## 2011-05-18 ENCOUNTER — Ambulatory Visit (INDEPENDENT_AMBULATORY_CARE_PROVIDER_SITE_OTHER): Payer: 59 | Admitting: Psychology

## 2011-05-18 DIAGNOSIS — F4323 Adjustment disorder with mixed anxiety and depressed mood: Secondary | ICD-10-CM

## 2011-06-08 ENCOUNTER — Ambulatory Visit (INDEPENDENT_AMBULATORY_CARE_PROVIDER_SITE_OTHER): Payer: 59 | Admitting: Psychology

## 2011-06-08 DIAGNOSIS — F4323 Adjustment disorder with mixed anxiety and depressed mood: Secondary | ICD-10-CM

## 2011-06-22 ENCOUNTER — Ambulatory Visit (INDEPENDENT_AMBULATORY_CARE_PROVIDER_SITE_OTHER): Payer: 59 | Admitting: Psychology

## 2011-06-22 DIAGNOSIS — F4323 Adjustment disorder with mixed anxiety and depressed mood: Secondary | ICD-10-CM

## 2011-06-29 ENCOUNTER — Ambulatory Visit (INDEPENDENT_AMBULATORY_CARE_PROVIDER_SITE_OTHER): Payer: 59 | Admitting: Psychology

## 2011-06-29 DIAGNOSIS — F4323 Adjustment disorder with mixed anxiety and depressed mood: Secondary | ICD-10-CM

## 2011-07-20 ENCOUNTER — Ambulatory Visit (INDEPENDENT_AMBULATORY_CARE_PROVIDER_SITE_OTHER): Payer: 59 | Admitting: Psychology

## 2011-07-20 DIAGNOSIS — F4323 Adjustment disorder with mixed anxiety and depressed mood: Secondary | ICD-10-CM

## 2011-09-14 ENCOUNTER — Ambulatory Visit (INDEPENDENT_AMBULATORY_CARE_PROVIDER_SITE_OTHER): Payer: 59 | Admitting: Psychology

## 2011-09-14 DIAGNOSIS — F4323 Adjustment disorder with mixed anxiety and depressed mood: Secondary | ICD-10-CM

## 2011-09-28 ENCOUNTER — Ambulatory Visit (INDEPENDENT_AMBULATORY_CARE_PROVIDER_SITE_OTHER): Payer: 59 | Admitting: Psychology

## 2011-09-28 DIAGNOSIS — F4323 Adjustment disorder with mixed anxiety and depressed mood: Secondary | ICD-10-CM

## 2011-10-26 ENCOUNTER — Ambulatory Visit: Payer: 59 | Admitting: Psychology

## 2011-10-30 ENCOUNTER — Other Ambulatory Visit: Payer: Self-pay | Admitting: Family Medicine

## 2011-10-30 DIAGNOSIS — E7800 Pure hypercholesterolemia, unspecified: Secondary | ICD-10-CM

## 2011-10-31 ENCOUNTER — Other Ambulatory Visit (INDEPENDENT_AMBULATORY_CARE_PROVIDER_SITE_OTHER): Payer: 59

## 2011-10-31 DIAGNOSIS — E7800 Pure hypercholesterolemia, unspecified: Secondary | ICD-10-CM

## 2011-10-31 LAB — COMPREHENSIVE METABOLIC PANEL
ALT: 19 U/L (ref 0–53)
AST: 23 U/L (ref 0–37)
Alkaline Phosphatase: 49 U/L (ref 39–117)
Creatinine, Ser: 1 mg/dL (ref 0.4–1.5)
GFR: 79.17 mL/min (ref >60.00)
Total Bilirubin: 0.6 mg/dL (ref 0.3–1.2)

## 2011-10-31 LAB — LIPID PANEL
HDL: 41.1 mg/dL (ref >39.00)
LDL Cholesterol: 84 mg/dL (ref 0–99)
Total CHOL/HDL Ratio: 3
Triglycerides: 59 mg/dL (ref 0.0–149.0)
VLDL: 11.8 mg/dL (ref 0.0–40.0)

## 2011-11-03 ENCOUNTER — Ambulatory Visit (INDEPENDENT_AMBULATORY_CARE_PROVIDER_SITE_OTHER): Payer: 59 | Admitting: Family Medicine

## 2011-11-03 ENCOUNTER — Encounter: Payer: Self-pay | Admitting: Family Medicine

## 2011-11-03 VITALS — BP 140/90 | HR 64 | Temp 98.6°F | Ht 71.0 in | Wt 166.2 lb

## 2011-11-03 DIAGNOSIS — Z136 Encounter for screening for cardiovascular disorders: Secondary | ICD-10-CM | POA: Insufficient documentation

## 2011-11-03 DIAGNOSIS — Z23 Encounter for immunization: Secondary | ICD-10-CM

## 2011-11-03 DIAGNOSIS — Z0000 Encounter for general adult medical examination without abnormal findings: Secondary | ICD-10-CM

## 2011-11-03 DIAGNOSIS — F329 Major depressive disorder, single episode, unspecified: Secondary | ICD-10-CM

## 2011-11-03 NOTE — Progress Notes (Signed)
I have personally reviewed the Medicare Annual Wellness questionnaire and have noted 1. The patient's medical and social history 2. Their use of alcohol, tobacco or illicit drugs 3. Their current medications and supplements 4. The patient's functional ability including ADL's, fall risks, home safety risks and hearing or visual             impairment. 5. Diet and physical activities 6. Evidence for depression or mood disorders  The patients weight, height, BMI have been recorded in the chart, and visual acuity per eye clinic.   I have made referrals, counseling and provided education to the patient based review of the above and I have provided the pt with a written personalized care plan for preventive services.  AD d/w pt.  He'll consider.   Mood is good.  He had a lot of social upheaval prev but is doing better now.  Off meds.    PMH and SH reviewed  ROS: See HPI, otherwise noncontributory.  Meds, vitals, and allergies reviewed.   GEN: nad, alert and oriented HEENT: mucous membranes moist NECK: supple w/o LA CV: rrr.  no murmur PULM: ctab, no inc wob ABD: soft, +bs EXT: no edema SKIN: no acute rash Prostate gland firm and smooth, no enlargement, nodularity, tenderness, mass, asymmetry or induration.

## 2011-11-03 NOTE — Patient Instructions (Signed)
Don't change your meds.  Take care.  Glad to see you.  

## 2011-11-04 ENCOUNTER — Encounter: Payer: Self-pay | Admitting: Family Medicine

## 2011-11-04 DIAGNOSIS — Z0000 Encounter for general adult medical examination without abnormal findings: Secondary | ICD-10-CM | POA: Insufficient documentation

## 2011-11-04 NOTE — Assessment & Plan Note (Signed)
Situational, improved.  Off meds, f/u prn.

## 2011-11-04 NOTE — Assessment & Plan Note (Signed)
See scanned forms.  PSA options were discussed along with recent recs.  No indication for psa at this point, since patient is low risk and there is no FH of prosate CA.  He declined testing of PSA.  D/w pt about zostavax.  Prev with AAA screen done, neg per patient.  colonoscopy up to date.

## 2011-11-13 ENCOUNTER — Other Ambulatory Visit: Payer: Self-pay | Admitting: Family Medicine

## 2012-03-26 ENCOUNTER — Ambulatory Visit (INDEPENDENT_AMBULATORY_CARE_PROVIDER_SITE_OTHER): Payer: 59 | Admitting: Family Medicine

## 2012-03-26 ENCOUNTER — Encounter: Payer: Self-pay | Admitting: Family Medicine

## 2012-03-26 VITALS — BP 130/80 | HR 76 | Temp 98.2°F | Wt 163.2 lb

## 2012-03-26 DIAGNOSIS — J3489 Other specified disorders of nose and nasal sinuses: Secondary | ICD-10-CM | POA: Insufficient documentation

## 2012-03-26 DIAGNOSIS — R0982 Postnasal drip: Secondary | ICD-10-CM | POA: Insufficient documentation

## 2012-03-26 MED ORDER — LEVOCETIRIZINE DIHYDROCHLORIDE 5 MG PO TABS: 5.0000 mg | ORAL_TABLET | Freq: Every evening | ORAL | Status: DC

## 2012-03-26 MED ORDER — FLUTICASONE PROPIONATE 50 MCG/ACT NA SUSP: 2.0000 | Freq: Every day | NASAL | Status: DC

## 2012-03-26 NOTE — Progress Notes (Signed)
Subjective:    Patient ID: [name], male    DOB: 03/30/1946, 66 y.o.   MRN: [id number]  HPI CC: sinusitis?  2 wk h/o increased fatigue, aching.  That has eased up some.  Does have yearlong PNDrainage, but recently worsening.  Sinuses have continued draining.  Occasional coughing fits.  Mild hoarseness.  Blowing nose productive of clear sputum.  Taking vicks, nyquil, cough drops.  Tried zyrtec for 5 days.  Otherwise takes allegra.  Takes nexium daily for GERD.  Not on nasal steroid.  Using nasal saline/neti pot.  No fevers/chills, abd pain, n/v, ear or tooth pain.  Has seen allergist in past.  No smokers at home.  No h/o asthma, COPD.  No sick contacts at home.  Past Medical History  Diagnosis Date  . Diverticulosis of colon   . Depression   . Anxiety   . Hiatal hernia   . Hyperlipidemia   . Insomnia      Review of Systems Per HPI    Objective:   Physical Exam  Constitutional: He appears well-developed and well-nourished. No distress.  HENT:  Head: Normocephalic and atraumatic.  Right Ear: Hearing, tympanic membrane, external ear and ear canal normal.  Left Ear: Hearing, tympanic membrane, external ear and ear canal normal.  Nose: Mucosal edema present. No rhinorrhea. Right sinus exhibits no maxillary sinus tenderness and no frontal sinus tenderness. Left sinus exhibits no maxillary sinus tenderness and no frontal sinus tenderness.  Mouth/Throat: Uvula is midline, oropharynx is clear and moist and mucous membranes are normal. No oropharyngeal exudate, posterior oropharyngeal edema, posterior oropharyngeal erythema or tonsillar abscesses.       Pale turbinates  Eyes: Conjunctivae and EOM are normal. Pupils are equal, round, and reactive to light. No scleral icterus.  Neck: Normal range of motion. Neck supple.  Cardiovascular: Normal rate, regular rhythm, normal heart sounds and intact distal pulses.   No murmur heard. Pulmonary/Chest: Effort normal and breath sounds  normal. No respiratory distress. He has no wheezes. He has no rales.  Musculoskeletal: He exhibits no edema.  Lymphadenopathy:    He has no cervical adenopathy.  Skin: Skin is warm and dry. No rash noted.       Assessment & Plan:

## 2012-03-26 NOTE — Patient Instructions (Addendum)
I think symptoms are coming from nasal drainage. Continue nexium.  Consider increasing nexium to twice a day for 3-5 days.  If not helping, start xyzal as well as flonase. Let us know if not improving.

## 2012-03-26 NOTE — Assessment & Plan Note (Signed)
Anticipate allergic. Significant GERD hx.  rec increase nexium to bid.  If not improving, start xyzal and flonase regularly for next 2 weeks. If worsening return sooner.

## 2012-05-31 ENCOUNTER — Ambulatory Visit (INDEPENDENT_AMBULATORY_CARE_PROVIDER_SITE_OTHER): Payer: 59

## 2012-05-31 DIAGNOSIS — Z23 Encounter for immunization: Secondary | ICD-10-CM

## 2012-10-28 ENCOUNTER — Other Ambulatory Visit: Payer: Self-pay | Admitting: Family Medicine

## 2012-12-08 ENCOUNTER — Other Ambulatory Visit: Payer: Self-pay | Admitting: Family Medicine

## 2012-12-24 ENCOUNTER — Telehealth: Payer: Self-pay | Admitting: Family Medicine

## 2012-12-24 ENCOUNTER — Encounter: Payer: Self-pay | Admitting: Family Medicine

## 2012-12-24 ENCOUNTER — Ambulatory Visit (INDEPENDENT_AMBULATORY_CARE_PROVIDER_SITE_OTHER): Payer: 59 | Admitting: Family Medicine

## 2012-12-24 VITALS — BP 120/78 | HR 64 | Temp 98.0°F | Wt 166.0 lb

## 2012-12-24 DIAGNOSIS — K5792 Diverticulitis of intestine, part unspecified, without perforation or abscess without bleeding: Secondary | ICD-10-CM

## 2012-12-24 DIAGNOSIS — K5732 Diverticulitis of large intestine without perforation or abscess without bleeding: Secondary | ICD-10-CM

## 2012-12-24 MED ORDER — CIPROFLOXACIN HCL 750 MG PO TABS: 750.0000 mg | ORAL_TABLET | Freq: Two times a day (BID) | ORAL | Status: DC

## 2012-12-24 MED ORDER — METRONIDAZOLE 500 MG PO TABS: 500.0000 mg | ORAL_TABLET | Freq: Three times a day (TID) | ORAL | Status: DC

## 2012-12-24 NOTE — Telephone Encounter (Signed)
Patient Information:  Caller Name: Airen  Phone: [phone]  Patient: Mason [name], Mason [name]  Gender: Male  DOB: 10/06/1945  Age: 67 Years  PCP: [name] Clelia Croft) [facility])  Office Follow Up:  Does the office need to follow up with this patient?: No  Instructions For The Office: N/A   Symptoms  Reason For Call & Symptoms: Patient thinks he is having a "diverticullitis attack".  Pain in Left  quadrant, sharp shooting pain across the abdomen down into scotal area. . Onset Saturday , 12/22/2012. Temperature 99.7 last night, no fever today.  Pain has decreased but is concerned. Describes , tired aching  Reviewed Health History In EMR: Yes  Reviewed Medications In EMR: Yes  Reviewed Allergies In EMR: Yes  Reviewed Surgeries / Procedures: Yes  Date of Onset of Symptoms: 12/22/2012  Treatments Tried: Aleve and yogurt  Treatments Tried Worked: No  Guideline(s) Used:  Abdominal Pain - Male  Disposition Per Guideline:   See Today in Office  Reason For Disposition Reached:   Age > 60 years  Advice Given:  Rest:  Lie down and rest until you feel better.  Fluids:  Sip clear fluids only (e.g., water, flat soft drinks or half-strength fruit juice) until the pain has been gone for over 2 hours. Then slowly return to a regular diet.  Diet:  Slowly advance diet from clear liquids to a bland diet  Avoid alcohol or caffeinated beverages  Avoid greasy or fatty foods  Avoid NSAIDS and Aspirin  : Avoid any drug that can irritate the stomach lining and make the pain worse (especially aspirin and NSAIDs like ibuprofen).  Call Back If:  You become worse.  Patient Will Follow Care Advice:  YES  Appointment Scheduled:  12/24/2012 12:15:00 Appointment Scheduled Provider:  Hannah Beat [facility])

## 2012-12-24 NOTE — Progress Notes (Signed)
Nature conservation officer at [facility] [name] [place] [address] Phone: [phone] Fax: [phone]  Date:  12/24/2012   Name:  Mason [name]   DOB:  Mar 14, 1946   MRN:  [id number] Gender: male Age: 67 y.o.  Primary Physician:  [name], MD  Evaluating MD: Hannah Beat, MD   Chief Complaint: ? diverticulitis flare up   History of Present Illness:  Mason [name] is a 67 y.o. pleasant patient who presents with the following:  Drove to [facility] and visited his son, felt tired and achy. Had some LLQ pain, and then started to run across to the right side. Feels somewhat like diverticulitis. Has quieted down some.  Had very severe sharp left-sided pain that radiated to the right. He has minimally been able to eat over the last few days. He did have a low-grade fever around 100. No vomiting or diarrhea. No sore throat, runny nose, or cough.  He has had some diverticulitis in the past, treated with Flagyl and Septra. It is of a penicillin allergy.   Patient Active Problem List   Diagnosis Date Noted  . PND (post-nasal drip) 03/26/2012  . Medicare annual wellness visit, initial 11/04/2011  . Screening for AAA (abdominal aortic aneurysm) 11/03/2011  . Depression 04/01/2011  . Insomnia 12/16/2010  . VITAMIN D DEFICIENCY 07/06/2009  . FATIGUE 06/22/2009  . ANXIETY 12/15/2007  . PEPTIC STRICTURE 12/15/2007  . HIATAL HERNIA 12/15/2007  . TONSILLECTOMY AND ADENOIDECTOMY, HX OF 12/15/2007  . HYPERLIPIDEMIA, MIXED 12/23/2006  . DIVERTICULOSIS, COLON W/O HEM 12/23/2006  . IRRITABLE BOWEL SYNDROME 12/23/2006    Past Medical History  Diagnosis Date  . Diverticulosis of colon   . Depression   . Anxiety   . Hiatal hernia   . Hyperlipidemia   . Insomnia     Past Surgical History  Procedure Laterality Date  . Wrist surgery      right, 13 years ago  . Tonsillectomy and adenoidectomy      remote  . Anal sphincterotomy  03/1995    fistulotomy  . Colonoscopy   04/1999    internal hemmroids  . Esophagogastroduodenoscopy  04/1999    with esophagitis and stricture  . Shoulder arthgroscopy  09/27/2004    decompression  . Bcc excision  09/02/2008    L supratip of nose with flap, [facility]    History   Social History  . Marital Status: Divorced    Spouse Name: N/A    Number of Children: 1  . Years of Education: N/A   Occupational History  . Art therapist, retired [employer]   Social History Main Topics  . Smoking status: Former Smoker    Types: Cigars  . Smokeless tobacco: Not on file  . Alcohol Use: No  . Drug Use: No  . Sexually Active:    Other Topics Concern  . Not on file   Social History Narrative   Retired from [employer]   From [place]   Prev divorced    1 son    Family History  Problem Relation Age of Onset  . Hypertension Mother   . Stroke Mother   . Pneumonia Father   . Colon cancer Neg Hx   . Prostate cancer Neg Hx     Allergies  Allergen Reactions  . Citalopram Hydrobromide     Intolerant, tried 03/2011  . Clidinium-Chlordiazepoxide     REACTION: urinary retention  . Erythromycin Base     REACTION: stomach upset  .  Hyoscyamine Sulfate     REACTION: urinary retention  . Penicillins     REACTION: Itching, rash  . Shrimp (Shellfish Allergy)     And crab- rash, GI upset, upper airway symptoms    Medication list has been reviewed and updated.  Outpatient Prescriptions Prior to Visit  Medication Sig Dispense Refill  . Ascorbic Acid (VITAMIN C) 500 MG tablet Take 500 mg by mouth daily.        . B Complex-C (B-COMPLEX WITH VITAMIN C) tablet Take 1 tablet by mouth daily.        . Bimatoprost (LUMIGAN) 0.01 % SOLN Apply 1 drop to eye daily.        . cholecalciferol (VITAMIN D) 1000 UNITS tablet Take 1,000 Units by mouth daily.        . Chromium (GTF CHROMIUM) 200 MCG TABS Take 1 tablet by mouth daily.      . diphenhydrAMINE (BENADRYL) 50 MG capsule Take 50 mg by mouth at bedtime as needed.        . fexofenadine (ALLEGRA) 180 MG tablet Take 1/2 tablet by mouth each day.      . fluticasone (FLONASE) 50 MCG/ACT nasal spray 2 SPRAYS INTO EACH NOSTRIL DAILY.  16 g  0  . Ginger, Zingiber officinalis, (GINGER ROOT) 550 MG CAPS Take 1 capsule by mouth daily.      Marland Kitchen levocetirizine (XYZAL) 5 MG tablet Take 1 tablet (5 mg total) by mouth every evening.  30 tablet  3  . NEXIUM 40 MG capsule TAKE 1 CAPSULE EVERY DAY  30 capsule  5  . saw palmetto 160 MG capsule Take 160 mg by mouth daily.      . sildenafil (VIAGRA) 100 MG tablet Take one tablet one hour before sexual activity       . vitamin E (VITAMIN E) 400 UNIT capsule Take 400 Units by mouth daily.         No facility-administered medications prior to visit.    Review of Systems:  ROS: GEN: Acute illness details above GI: Tolerating PO intake GU: maintaining adequate hydration and urination Pulm: No SOB Interactive and getting along well at home.  Otherwise, ROS is as per the HPI.   Physical Examination: BP 120/78  Pulse 64  Temp(Src) 98 F (36.7 C)  Wt 166 lb (75.297 kg)  BMI 23.16 kg/m2  Ideal Body Weight:    GEN: WDWN, NAD, Non-toxic, A & O x 3 HEENT: Atraumatic, Normocephalic. Neck supple. No masses, No LAD. Ears and Nose: No external deformity. CV: RRR, No M/G/R. No JVD. No thrill. No extra heart sounds. PULM: CTA B, no wheezes, crackles, rhonchi. No retractions. No resp. distress. No accessory muscle use. ABD: S, NT, mild LLQ abdominal tenderness, +BS. No rebound. No HSM. EXTR: No c/c/e NEURO Normal gait.  PSYCH: Normally interactive. Conversant. Not depressed or anxious appearing.  Calm demeanor.    Assessment and Plan:  Acute diverticulitis  Classic history and presentation. Will treat clinically.  Orders Today:  No orders of the defined types were placed in this encounter.    Updated Medication List: (Includes new medications, updates to list, dose adjustments) Meds ordered this encounter  Medications   . metroNIDAZOLE (FLAGYL) 500 MG tablet    Sig: Take 1 tablet (500 mg total) by mouth 3 (three) times daily.    Dispense:  30 tablet    Refill:  0  . ciprofloxacin (CIPRO) 750 MG tablet    Sig: Take 1 tablet (750 mg  total) by mouth 2 (two) times daily.    Dispense:  20 tablet    Refill:  0    Medications Discontinued: There are no discontinued medications.    Signed, Elpidio Galea. Germaine Shenker, MD 12/24/2012 12:49 PM

## 2013-01-17 ENCOUNTER — Telehealth: Payer: Self-pay | Admitting: Family Medicine

## 2013-01-17 ENCOUNTER — Ambulatory Visit (INDEPENDENT_AMBULATORY_CARE_PROVIDER_SITE_OTHER): Payer: 59 | Admitting: Family Medicine

## 2013-01-17 VITALS — BP 120/74 | HR 72 | Temp 98.8°F | Ht 71.0 in | Wt 168.2 lb

## 2013-01-17 DIAGNOSIS — R109 Unspecified abdominal pain: Secondary | ICD-10-CM

## 2013-01-17 DIAGNOSIS — R195 Other fecal abnormalities: Secondary | ICD-10-CM

## 2013-01-17 LAB — CBC WITH DIFFERENTIAL/PLATELET
Basophils Relative: 0.7 % (ref 0.0–3.0)
Eosinophils Relative: 3.3 % (ref 0.0–5.0)
Hemoglobin: 14.2 g/dL (ref 13.0–17.0)
Lymphocytes Relative: 20.2 % (ref 12.0–46.0)
MCHC: 34 g/dL (ref 30.0–36.0)
Monocytes Relative: 6.8 % (ref 3.0–12.0)
Neutro Abs: 6.3 10*3/uL (ref 1.4–7.7)
RBC: 4.62 Mil/uL (ref 4.22–5.81)
WBC: 9.1 10*3/uL (ref 4.5–10.5)

## 2013-01-17 LAB — HEPATIC FUNCTION PANEL
ALT: 15 U/L (ref 0–53)
AST: 19 U/L (ref 0–37)
Albumin: 3.7 g/dL (ref 3.5–5.2)
Alkaline Phosphatase: 48 U/L (ref 39–117)
Total Protein: 7.1 g/dL (ref 6.0–8.3)

## 2013-01-17 NOTE — Telephone Encounter (Signed)
Patient Information:  Caller Name: Tyshon  Phone: [phone]  Patient: Mason [name], Mason [name]  Gender: Male  DOB: [birth year]  Age: 67 Years  PCP: [name] Clelia Croft) [facility])  Office Follow Up:  Does the office need to follow up with this patient?: No  Instructions For The Office: N/A  RN Note:  RN called the office to see if appt could be made in the openings that were on hold.  Dr Dallas Schimke agreed to see pt today at 1430.  RN instructed pt to be at the office at 1415.  Symptoms  Reason For Call & Symptoms: pt reports he was seen in the office for 12/24/12 for diverticulitis.  Pt reports that he has completed the antibiotic but he is not getting better.  Pt reports he is still having cramps until "he empties out".  Pt reports the cramps are severe when they are present and he has vomited x 1 due to the pain.  Reviewed Health History In EMR: Yes  Reviewed Medications In EMR: Yes  Reviewed Allergies In EMR: Yes  Reviewed Surgeries / Procedures: Yes  Date of Onset of Symptoms: 12/24/2012  Guideline(s) Used:  Abdominal Pain - Male  Disposition Per Guideline:   See Today in Office  Reason For Disposition Reached:   Age > 60 years  Advice Given:  Call Back If:  You become worse.  Patient Will Follow Care Advice:  YES  Appointment Scheduled:  01/17/2013 14:30:00 Appointment Scheduled Provider:  Hannah Beat (Family Practice)  (office made appt)

## 2013-01-17 NOTE — Progress Notes (Signed)
Nature conservation officer at [facility] [address] [place] [address] Phone: [phone] Fax: [phone]  Date:  01/17/2013   Name:  Mason [name]   DOB:  1945-10-05   MRN:  [id number] Gender: male Age: 67 y.o.  Primary Physician:  [name], MD  Evaluating MD: Hannah Beat, MD   Chief Complaint: Abdominal Pain   History of Present Illness:  Mason [name] is a 67 y.o. pleasant patient who presents with the following:  5 weeks ago treated for presumed diverticulitis with Cipro and Flagyl and majority of symptoms resolved, but he has had some persistent abdominal symptoms. A couple of days an aches and pains, then spasms. Left lateral abdomen is feeling better, but it is more anterior. In the past Levsin has cause urinary retention when he was given this by GI.  Has had some looser stools continue, as well.   Has not been eating much.   Patient Active Problem List   Diagnosis Date Noted  . PND (post-nasal drip) 03/26/2012  . Medicare annual wellness visit, initial 11/04/2011  . Screening for AAA (abdominal aortic aneurysm) 11/03/2011  . Depression 04/01/2011  . Insomnia 12/16/2010  . VITAMIN D DEFICIENCY 07/06/2009  . FATIGUE 06/22/2009  . ANXIETY 12/15/2007  . PEPTIC STRICTURE 12/15/2007  . HIATAL HERNIA 12/15/2007  . TONSILLECTOMY AND ADENOIDECTOMY, HX OF 12/15/2007  . HYPERLIPIDEMIA, MIXED 12/23/2006  . DIVERTICULOSIS, COLON W/O HEM 12/23/2006  . IRRITABLE BOWEL SYNDROME 12/23/2006    Past Medical History  Diagnosis Date  . Diverticulosis of colon   . Depression   . Anxiety   . Hiatal hernia   . Hyperlipidemia   . Insomnia     Past Surgical History  Procedure Laterality Date  . Wrist surgery      right, 13 years ago  . Tonsillectomy and adenoidectomy      remote  . Anal sphincterotomy  03/1995    fistulotomy  . Colonoscopy  04/1999    internal hemmroids  . Esophagogastroduodenoscopy  04/1999    with esophagitis and stricture    . Shoulder arthgroscopy  09/27/2004    decompression  . Bcc excision  09/02/2008    L supratip of nose with flap, [facility]/MOHS  . Appendectomy      History   Social History  . Marital Status: Divorced    Spouse Name: N/A    Number of Children: 1  . Years of Education: N/A   Occupational History  . Art therapist, retired [employer]   Social History Main Topics  . Smoking status: Former Smoker    Types: Cigars  . Smokeless tobacco: Not on file  . Alcohol Use: No  . Drug Use: No  . Sexually Active:    Other Topics Concern  . Not on file   Social History Narrative   Retired from [employer]   From [place]   Prev divorced    1 son    Family History  Problem Relation Age of Onset  . Hypertension Mother   . Stroke Mother   . Pneumonia Father   . Colon cancer Neg Hx   . Prostate cancer Neg Hx     Allergies  Allergen Reactions  . Citalopram Hydrobromide     Intolerant, tried 03/2011  . Clidinium-Chlordiazepoxide     REACTION: urinary retention  . Erythromycin Base     REACTION: stomach upset  . Hyoscyamine Sulfate     REACTION: urinary retention  . Penicillins  REACTION: Itching, rash  . Shrimp (Shellfish Allergy)     And crab- rash, GI upset, upper airway symptoms    Medication list has been reviewed and updated.  Outpatient Prescriptions Prior to Visit  Medication Sig Dispense Refill  . Bimatoprost (LUMIGAN) 0.01 % SOLN Apply 1 drop to eye daily.        . diphenhydrAMINE (BENADRYL) 50 MG capsule Take 50 mg by mouth at bedtime as needed.      Marland Kitchen NEXIUM 40 MG capsule TAKE 1 CAPSULE EVERY DAY  30 capsule  5  . sildenafil (VIAGRA) 100 MG tablet Take one tablet one hour before sexual activity       . levocetirizine (XYZAL) 5 MG tablet Take 1 tablet (5 mg total) by mouth every evening.  30 tablet  3  . Ascorbic Acid (VITAMIN C) 500 MG tablet Take 500 mg by mouth daily.        . B Complex-C (B-COMPLEX WITH VITAMIN C) tablet Take 1 tablet by mouth daily.         . cholecalciferol (VITAMIN D) 1000 UNITS tablet Take 1,000 Units by mouth daily.        . Chromium (GTF CHROMIUM) 200 MCG TABS Take 1 tablet by mouth daily.      . ciprofloxacin (CIPRO) 750 MG tablet Take 1 tablet (750 mg total) by mouth 2 (two) times daily.  20 tablet  0  . fexofenadine (ALLEGRA) 180 MG tablet Take 1/2 tablet by mouth each day.      . fluticasone (FLONASE) 50 MCG/ACT nasal spray 2 SPRAYS INTO EACH NOSTRIL DAILY.  16 g  0  . Ginger, Zingiber officinalis, (GINGER ROOT) 550 MG CAPS Take 1 capsule by mouth daily.      . metroNIDAZOLE (FLAGYL) 500 MG tablet Take 1 tablet (500 mg total) by mouth 3 (three) times daily.  30 tablet  0  . saw palmetto 160 MG capsule Take 160 mg by mouth daily.      . vitamin E (VITAMIN E) 400 UNIT capsule Take 400 Units by mouth daily.         No facility-administered medications prior to visit.    Review of Systems:  As above, no fever, chills, sweats, or chest pain.  Physical Examination: BP 120/74  Pulse 72  Temp(Src) 98.8 F (37.1 C) (Oral)  Ht 5\' 11"  (1.803 m)  Wt 168 lb 4 oz (76.318 kg)  BMI 23.48 kg/m2  SpO2 98%  Ideal Body Weight: Weight in (lb) to have BMI = 25: 178.9  GEN: WDWN, NAD, Non-toxic, A & O x 3 HEENT: Atraumatic, Normocephalic. Neck supple. No masses, No LAD. Ears and Nose: No external deformity. CV: RRR, No M/G/R. No JVD. No thrill. No extra heart sounds. PULM: CTA B, no wheezes, crackles, rhonchi. No retractions. No resp. distress. No accessory muscle use. ABD: S, NT, ND, +BS. No rebound. No HSM. EXTR: No c/c/e NEURO Normal gait.  PSYCH: Normally interactive. Conversant. Not depressed or anxious appearing.  Calm demeanor.    Assessment and Plan:  Abdominal  pain, other specified site - Plan: CBC with Differential, Hepatic function panel, Lipase, Clostridium Difficile by PCR  Loose stools - Plan: CBC with Differential, Hepatic function panel, Lipase, Clostridium Difficile by PCR  Exam is much more  benign than last time, reassuring. Challenge gut, add bulk formers, check c diff.   If worsening next week, may need imaging of abdomen.  Orders Today:  Orders Placed This Encounter  Procedures  .  Clostridium Difficile by PCR    Standing Status: Future     Number of Occurrences:      Standing Expiration Date: 01/17/2014  . CBC with Differential  . Hepatic function panel  . Lipase    Updated Medication List: (Includes new medications, updates to list, dose adjustments) No orders of the defined types were placed in this encounter.    Medications Discontinued: Medications Discontinued During This Encounter  Medication Reason  . ciprofloxacin (CIPRO) 750 MG tablet Completed Course  . metroNIDAZOLE (FLAGYL) 500 MG tablet Completed Course  . Ascorbic Acid (VITAMIN C) 500 MG tablet Error  . B Complex-C (B-COMPLEX WITH VITAMIN C) tablet Error  . cholecalciferol (VITAMIN D) 1000 UNITS tablet Error  . fexofenadine (ALLEGRA) 180 MG tablet Error  . fluticasone (FLONASE) 50 MCG/ACT nasal spray Error  . Ginger, Zingiber officinalis, (GINGER ROOT) 550 MG CAPS Error  . Chromium (GTF CHROMIUM) 200 MCG TABS Error  . saw palmetto 160 MG capsule Error  . vitamin E (VITAMIN E) 400 UNIT capsule Error      Signed, Seena Ritacco T. Easter Kennebrew, MD 01/17/2013 2:24 PM

## 2013-01-17 NOTE — Patient Instructions (Addendum)
Metamucil or citrucel daily Plenty of water  We'll see what your labs say and get back to you next week.

## 2013-01-20 ENCOUNTER — Encounter: Payer: Self-pay | Admitting: Family Medicine

## 2013-01-23 ENCOUNTER — Telehealth: Payer: Self-pay | Admitting: *Deleted

## 2013-01-23 NOTE — Telephone Encounter (Signed)
Patient says he feels he is getting back to normal.Patient advised of results.

## 2013-01-23 NOTE — Telephone Encounter (Signed)
i apologize - i think we made an error and did not call.  All fine.  How is he feeling?

## 2013-01-23 NOTE — Telephone Encounter (Signed)
Pt was seen last week and hasn't gotten his lab results yet. Results are available in emr, do you have any instructions or info for the pt re his results?

## 2013-04-02 DIAGNOSIS — L570 Actinic keratosis: Secondary | ICD-10-CM

## 2013-04-02 HISTORY — DX: Actinic keratosis: L57.0

## 2013-04-17 ENCOUNTER — Other Ambulatory Visit: Payer: Self-pay | Admitting: Family Medicine

## 2013-04-17 DIAGNOSIS — E782 Mixed hyperlipidemia: Secondary | ICD-10-CM

## 2013-04-23 ENCOUNTER — Telehealth: Payer: Self-pay | Admitting: Family Medicine

## 2013-04-23 DIAGNOSIS — Z125 Encounter for screening for malignant neoplasm of prostate: Secondary | ICD-10-CM

## 2013-04-23 NOTE — Telephone Encounter (Signed)
Patient is coming in this Thursday for lab work for his physical.  Patient is requesting that you order PSA.

## 2013-04-23 NOTE — Telephone Encounter (Signed)
Ordered

## 2013-04-23 NOTE — Addendum Note (Signed)
Addended by: Joaquim Nam on: 04/23/2013 03:59 PM   Modules accepted: Orders

## 2013-04-25 ENCOUNTER — Other Ambulatory Visit (INDEPENDENT_AMBULATORY_CARE_PROVIDER_SITE_OTHER): Payer: 59

## 2013-04-25 DIAGNOSIS — R5381 Other malaise: Secondary | ICD-10-CM

## 2013-04-25 DIAGNOSIS — E782 Mixed hyperlipidemia: Secondary | ICD-10-CM

## 2013-04-25 DIAGNOSIS — Z125 Encounter for screening for malignant neoplasm of prostate: Secondary | ICD-10-CM

## 2013-04-25 LAB — LIPID PANEL
LDL Cholesterol: 96 mg/dL (ref 0–99)
Total CHOL/HDL Ratio: 4

## 2013-04-25 LAB — PSA: PSA: 3.14 ng/mL (ref 0.10–4.00)

## 2013-04-27 ENCOUNTER — Other Ambulatory Visit: Payer: Self-pay | Admitting: Family Medicine

## 2013-04-29 ENCOUNTER — Encounter: Payer: Self-pay | Admitting: Family Medicine

## 2013-04-29 ENCOUNTER — Ambulatory Visit (INDEPENDENT_AMBULATORY_CARE_PROVIDER_SITE_OTHER): Payer: PRIVATE HEALTH INSURANCE | Admitting: Family Medicine

## 2013-04-29 VITALS — BP 106/80 | HR 61 | Temp 98.2°F | Ht 69.5 in | Wt 168.2 lb

## 2013-04-29 DIAGNOSIS — K449 Diaphragmatic hernia without obstruction or gangrene: Secondary | ICD-10-CM

## 2013-04-29 DIAGNOSIS — Z0000 Encounter for general adult medical examination without abnormal findings: Secondary | ICD-10-CM

## 2013-04-29 DIAGNOSIS — M25562 Pain in left knee: Secondary | ICD-10-CM | POA: Insufficient documentation

## 2013-04-29 DIAGNOSIS — M25569 Pain in unspecified knee: Secondary | ICD-10-CM

## 2013-04-29 DIAGNOSIS — Z23 Encounter for immunization: Secondary | ICD-10-CM

## 2013-04-29 MED ORDER — SILDENAFIL CITRATE 100 MG PO TABS: 50.0000 mg | ORAL_TABLET | ORAL | Status: DC | PRN

## 2013-04-29 MED ORDER — ESOMEPRAZOLE MAGNESIUM 40 MG PO CPDR: DELAYED_RELEASE_CAPSULE | ORAL | Status: DC

## 2013-04-29 NOTE — Patient Instructions (Addendum)
Take care.  Don't change your meds.  Glad to see you.  Recheck in about 1 year.

## 2013-04-29 NOTE — Assessment & Plan Note (Signed)
Controlled, continue PPI

## 2013-04-29 NOTE — Assessment & Plan Note (Signed)
Likely bursitis, ice and limit activity that causes pain, ie kneeling.  Should improve.  F/u prn.

## 2013-04-29 NOTE — Progress Notes (Signed)
I have personally reviewed the Medicare Annual Wellness questionnaire and have noted 1. The patient's medical and social history 2. Their use of alcohol, tobacco or illicit drugs 3. Their current medications and supplements 4. The patient's functional ability including ADL's, fall risks, home safety risks and hearing or visual             impairment. 5. Diet and physical activities 6. Evidence for depression or mood disorders  The patients weight, height, BMI have been recorded in the chart and visual acuity is per eye clinic.  I have made referrals, counseling and provided education to the patient based review of the above and I have provided the pt with a written personalized care plan for preventive services.  See scanned forms.  Routine anticipatory guidance given to patient.  See health maintenance. Flu today Shingles prev done PNA prev done Tetanus prev done Colonoscopy 2010 PSA wnl.  Advance directive d/w pt. Encouraged getting a living will and he'll consider.  Son would be designated if pt were incapacitated.   Cognitive function addressed- see scanned forms- and if abnormal then additional documentation follows.   H/o peptic stricture and controlled with PPI.  No ADE.  No blood in stool.   L knee pain with kneeling but not squatting.  No trauma.  Anterior pain, just lateral to midline.    PMH and SH reviewed  Meds, vitals, and allergies reviewed.   ROS: See HPI.  Otherwise negative.    GEN: nad, alert and oriented HEENT: mucous membranes moist NECK: supple w/o LA CV: rrr. PULM: ctab, no inc wob ABD: soft, +bs EXT: no edema SKIN: no acute rash L knee with normal ROM and inspection.  Mildly ttp just lateral and inferior to patella.

## 2013-04-29 NOTE — Assessment & Plan Note (Signed)
See scanned forms.  Routine anticipatory guidance given to patient.  See health maintenance. Flu today Shingles prev done PNA prev done Tetanus prev done Colonoscopy 2010 PSA wnl.  Advance directive d/w pt. Encouraged getting a living will and he'll consider.  Son would be designated if pt were incapacitated.   Cognitive function addressed- see scanned forms- and if abnormal then additional documentation follows.

## 2013-06-28 ENCOUNTER — Ambulatory Visit (INDEPENDENT_AMBULATORY_CARE_PROVIDER_SITE_OTHER): Payer: 59 | Admitting: Family Medicine

## 2013-06-28 ENCOUNTER — Encounter: Payer: Self-pay | Admitting: Family Medicine

## 2013-06-28 ENCOUNTER — Encounter: Payer: Self-pay | Admitting: Internal Medicine

## 2013-06-28 VITALS — BP 134/88 | HR 63 | Temp 98.2°F | Wt 166.5 lb

## 2013-06-28 DIAGNOSIS — K219 Gastro-esophageal reflux disease without esophagitis: Secondary | ICD-10-CM | POA: Insufficient documentation

## 2013-06-28 MED ORDER — ESOMEPRAZOLE MAGNESIUM 40 MG PO CPDR: DELAYED_RELEASE_CAPSULE | ORAL | Status: DC

## 2013-06-28 NOTE — Progress Notes (Signed)
Throat sx. It has been going on "for awhile."  Prev with EGD and lower esophageal narrowing.  Progressively worsening.  More belching.  Burning in the throat, esp in AM. Feeling of "something there that shouldn't be there" in epigastrum and the throat.  Cut back on eating late. On PPI.  Head of bed is already elevated. Frequent throat clearing.  Not vomiting blood or passing blood.  No weight loss, no bruising.  No feeling of food sticking with swallowing. Nonsmoker.  Minimal etoh.  He has cut out trigger foods. No nsaids.  Some coffee in AM.   Meds, vitals, and allergies reviewed.   ROS: See HPI.  Otherwise, noncontributory.  GEN: nad, alert and oriented HEENT: mucous membranes moist, OP wnl. NECK: supple w/o LA CV: rrr.  PULM: ctab, no inc wob ABD: soft, +bs

## 2013-06-28 NOTE — Progress Notes (Signed)
Pre-visit discussion using our clinic review tool. No additional management support is needed unless otherwise documented below in the visit note.  

## 2013-06-28 NOTE — Patient Instructions (Signed)
Mason [name] will call about your referral. Increase the nexium to twice a day.  Take care.

## 2013-06-28 NOTE — Assessment & Plan Note (Signed)
With concern for narrowing of esophagus.  Would inc PPI to BID.  Already with HOB elevated and has cut out trigger foods. Refer to GI for consideration of EGD. He agrees with plan.

## 2013-07-09 ENCOUNTER — Encounter: Payer: Self-pay | Admitting: Family Medicine

## 2013-08-05 ENCOUNTER — Ambulatory Visit (INDEPENDENT_AMBULATORY_CARE_PROVIDER_SITE_OTHER): Payer: 59 | Admitting: Internal Medicine

## 2013-08-05 ENCOUNTER — Encounter: Payer: Self-pay | Admitting: Internal Medicine

## 2013-08-05 VITALS — BP 140/80 | HR 64 | Ht 69.0 in | Wt 167.2 lb

## 2013-08-05 DIAGNOSIS — K219 Gastro-esophageal reflux disease without esophagitis: Secondary | ICD-10-CM

## 2013-08-05 NOTE — Patient Instructions (Signed)

## 2013-08-05 NOTE — Progress Notes (Signed)
HISTORY OF PRESENT ILLNESS:  Mason [name] is a 67 y.o. male who has been followed in this office previously for GERD and colon cancer screening. Remote upper endoscopy in 2000 revealed erosive esophagitis with stricture. His last colonoscopy in 2005 revealed severe diverticulosis. He presents today, upon referral by Dr. Para March, with a one-year history of increased belching with some pharyngeal and nasal burning. He had been on Nexium 40 mg daily. Increased to 40 mg twice a day about 5 weeks ago. He has not noticed any significant difference. He is concerned about esophageal cancer. No true dysphagia. He continues with stable IBS-type complaints. His chronic medical problems are stable  REVIEW OF SYSTEMS:  All non-GI ROS negative except for sinus and allergy, back pain, muscle cramps  Past Medical History  Diagnosis Date  . Diverticulosis of colon   . Depression   . Anxiety   . Hiatal hernia   . Hyperlipidemia   . Insomnia   . GERD (gastroesophageal reflux disease)     esophageal narrowing  . Internal hemorrhoids   . Anal fissure   . Glaucoma   . IBS (irritable bowel syndrome)     Past Surgical History  Procedure Laterality Date  . Wrist surgery Right     13 years ago  . Tonsillectomy and adenoidectomy      remote  . Anal sphincterotomy  03/1995    fistulotomy  . Colonoscopy  04/1999    internal hemmroids  . Esophagogastroduodenoscopy  04/1999    with esophagitis and stricture  . Bicept tenodesis Right 09/27/2004    decompression  . Mohs surgery  09/02/2008    L supratip of nose with flap,basal cell cancer [facility]  . Appendectomy    . Basal cell carcinoma excision      on back    Social History Mason [name]  reports that he quit smoking about 14 years ago. His smoking use included Cigarettes and Cigars. He smoked 0.00 packs per day. He has never used smokeless tobacco. He reports that he does not drink alcohol or use illicit drugs.  family history includes  Breast cancer in his maternal aunt; Cancer in his maternal aunt and maternal grandmother; Hypertension in his mother; Pneumonia in his father; Stroke in his mother. There is no history of Colon cancer or Prostate cancer.  Allergies  Allergen Reactions  . Chlordiazepoxide-Clidinium     REACTION: urinary retention  . Citalopram Hydrobromide     Intolerant, tried 03/2011  . Erythromycin Base     REACTION: stomach upset  . Hyoscyamine Sulfate     REACTION: urinary retention  . Penicillins     REACTION: Itching, rash  . Shrimp [Shellfish Allergy]     And crab- rash, GI upset, upper airway symptoms       PHYSICAL EXAMINATION: Vital signs: BP 140/80  Pulse 64  Ht 5\' 9"  (1.753 m)  Wt 167 lb 4 oz (75.864 kg)  BMI 24.69 kg/m2 General: Well-developed, well-nourished, no acute distress HEENT: Sclerae are anicteric, conjunctiva pink. Oral mucosa intact Lungs: Clear Heart: Regular Abdomen: soft, nontender, nondistended, no obvious ascites, no peritoneal signs, normal bowel sounds. No organomegaly. Extremities: No edema Psychiatric: alert and oriented x3. Cooperative   ASSESSMENT:  #1. GERD. Worsening symptoms despite twice a day PPI. #2. Family history of colon cancer. Last colonoscopy 2005 with diverticulosis  PLAN:  #1. Continue reflux process #2. Continue twice a day PPI for now #3. Diagnostic upper endoscopy.The nature of the procedure, as well as  the risks, benefits, and alternatives were carefully and thoroughly reviewed with the patient. Ample time for discussion and questions allowed. The patient understood, was satisfied, and agreed to proceed. #4. Screening colonoscopy 2015

## 2013-08-23 ENCOUNTER — Telehealth: Payer: Self-pay | Admitting: *Deleted

## 2013-08-23 DIAGNOSIS — N529 Male erectile dysfunction, unspecified: Secondary | ICD-10-CM

## 2013-08-23 NOTE — Telephone Encounter (Signed)
Received prior auth request for Viagra. Auth paperwork obtained and placed in your inbox.

## 2013-08-26 DIAGNOSIS — N529 Male erectile dysfunction, unspecified: Secondary | ICD-10-CM | POA: Insufficient documentation

## 2013-08-26 NOTE — Telephone Encounter (Signed)
Form done. Thanks. 

## 2013-08-29 ENCOUNTER — Encounter: Payer: Self-pay | Admitting: Internal Medicine

## 2013-08-29 ENCOUNTER — Ambulatory Visit (AMBULATORY_SURGERY_CENTER): Payer: 59 | Admitting: Internal Medicine

## 2013-08-29 VITALS — BP 152/98 | HR 66 | Temp 96.3°F | Resp 15 | Ht 69.0 in | Wt 167.0 lb

## 2013-08-29 DIAGNOSIS — K219 Gastro-esophageal reflux disease without esophagitis: Secondary | ICD-10-CM

## 2013-08-29 MED ORDER — SODIUM CHLORIDE 0.9 % IV SOLN: 500.0000 mL | INTRAVENOUS | Status: DC

## 2013-08-29 NOTE — Telephone Encounter (Signed)
Prior auth denied by insurance company. Pharmacy notified via fax, and denial paperwork placed in inbox to be signed then scanned into pt's medical record.  

## 2013-08-29 NOTE — Progress Notes (Signed)
Lidocaine-40mg IV prior to Propofol InductionPropofol given over incremental dosages 

## 2013-08-29 NOTE — Telephone Encounter (Signed)
Noted denial, no reason given. May be approved if ED 2/2 underlying condition.  Ok to wait for PCP.  Placed in his box.

## 2013-08-29 NOTE — Op Note (Signed)
[facility]  [address]. [place], [address]   ENDOSCOPY PROCEDURE REPORT  PATIENT: Mason [name], Mason [name]  MR#: [id number] BIRTHDATE: [birth year] , 58  yrs. old GENDER: Male ENDOSCOPIST: Eustace Quail, MD REFERRED BY:  .  Self / Office PROCEDURE DATE:  08/29/2013 PROCEDURE:  EGD, diagnostic ASA CLASS:     Class II INDICATIONS:  History of esophageal reflux.  recent exacerbation. On twice a day PPI MEDICATIONS: MAC sedation, administered by CRNA and propofol (Diprivan) 80mg  IV TOPICAL ANESTHETIC: Cetacaine Spray  DESCRIPTION OF PROCEDURE: After the risks benefits and alternatives of the procedure were thoroughly explained, informed consent was obtained.  The LB [id number] endoscope was introduced through the mouth and advanced to the second portion of the duodenum. Without limitations.  The instrument was slowly withdrawn as the mucosa was fully examined.      EXAM:The upper, middle and distal third of the esophagus were carefully inspected and no abnormalities were noted.  The z-line was well seen at the GEJ.  The endoscope was pushed into the fundus which was normal including a retroflexed view.  The antrum, gastric body, first and second part of the duodenum were unremarkable. Retroflexed views revealed no abnormalities.     The scope was then withdrawn from the patient and the procedure completed.  COMPLICATIONS: There were no complications. ENDOSCOPIC IMPRESSION: 1.Normal EGD 2. GERD  RECOMMENDATIONS: 1.  Anti-reflux regimen to be followed 2.  Continue Nexium. Take dosage that is most effective for you  REPEAT EXAM:  eSigned:  Eustace Quail, MD 08/29/2013 4:10 PM   XK:GYJEHU Damita Dunnings, MD and The Patient

## 2013-08-29 NOTE — Patient Instructions (Signed)
YOU HAD AN ENDOSCOPIC PROCEDURE TODAY AT [facility]: Refer to the procedure report that was given to you for any specific questions about what was found during the examination.  If the procedure report does not answer your questions, please call your gastroenterologist to clarify.  If you requested that your care partner not be given the details of your procedure findings, then the procedure report has been included in a sealed envelope for you to review at your convenience later.  YOU SHOULD EXPECT: Some feelings of bloating in the abdomen. Passage of more gas than usual.  Walking can help get rid of the air that was put into your GI tract during the procedure and reduce the bloating. If you had a lower endoscopy (such as a colonoscopy or flexible sigmoidoscopy) you may notice spotting of blood in your stool or on the toilet paper. If you underwent a bowel prep for your procedure, then you may not have a normal bowel movement for a few days.  DIET: Your first meal following the procedure should be a light meal and then it is ok to progress to your normal diet.  A half-sandwich or bowl of soup is an example of a good first meal.  Heavy or fried foods are harder to digest and may make you feel nauseous or bloated.  Likewise meals heavy in dairy and vegetables can cause extra gas to form and this can also increase the bloating.  Drink plenty of fluids but you should avoid alcoholic beverages for 24 hours.  ACTIVITY: Your care partner should take you home directly after the procedure.  You should plan to take it easy, moving slowly for the rest of the day.  You can resume normal activity the day after the procedure however you should NOT DRIVE or use heavy machinery for 24 hours (because of the sedation medicines used during the test).    SYMPTOMS TO REPORT IMMEDIATELY: A gastroenterologist can be reached at any hour.  During normal business hours, 8:30 AM to 5:00 PM Monday through Friday,  call [phone].  After hours and on weekends, please call the GI answering service at [phone] who will take a message and have the physician on call contact you.     Following upper endoscopy (EGD)  Vomiting of blood or coffee ground material  New chest pain or pain under the shoulder blades  Painful or persistently difficult swallowing  New shortness of breath  Fever of 100F or higher  Black, tarry-looking stools  FOLLOW UP: If any biopsies were taken you will be contacted by phone or by letter within the next 1-3 weeks.  Call your gastroenterologist if you have not heard about the biopsies in 3 weeks.  Our staff will call the home number listed on your records the next business day following your procedure to check on you and address any questions or concerns that you may have at that time regarding the information given to you following your procedure. This is a courtesy call and so if there is no answer at the home number and we have not heard from you through the emergency physician on call, we will assume that you have returned to your regular daily activities without incident.  SIGNATURES/CONFIDENTIALITY:   Normal EGD.  Nexium -take dosage that is most effective for you.l  Anti-reflux information given. You and/or your care partner have signed paperwork which will be entered into your electronic medical record.  These signatures attest to the  fact that that the information above on your After Visit Summary has been reviewed and is understood.  Full responsibility of the confidentiality of this discharge information lies with you and/or your care-partner.

## 2013-08-30 ENCOUNTER — Telehealth: Payer: Self-pay

## 2013-08-30 NOTE — Telephone Encounter (Signed)
  Follow up Call-  Call back number 08/29/2013  Post procedure Call Back phone  # [phone] cell  Permission to leave phone message Yes     Patient questions:  Do you have a fever, pain , or abdominal swelling? no Pain Score  0 *  Have you tolerated food without any problems? yes  Have you been able to return to your normal activities? yes  Do you have any questions about your discharge instructions: Diet   no Medications  no Follow up visit  no  Do you have questions or concerns about your Care? no  Actions: * If pain score is 4 or above: No action needed, pain <4.

## 2013-09-02 ENCOUNTER — Encounter: Payer: Self-pay | Admitting: *Deleted

## 2013-09-02 NOTE — Telephone Encounter (Signed)
Letter mailed

## 2013-09-02 NOTE — Telephone Encounter (Signed)
If there was no underlying condition (and I think that is the case here), then he'll have to take this up with the insurance company and appeal it.  Thanks.

## 2013-11-28 ENCOUNTER — Encounter: Payer: Self-pay | Admitting: Internal Medicine

## 2013-11-30 ENCOUNTER — Other Ambulatory Visit: Payer: Self-pay | Admitting: Family Medicine

## 2013-12-10 ENCOUNTER — Ambulatory Visit (INDEPENDENT_AMBULATORY_CARE_PROVIDER_SITE_OTHER): Payer: 59 | Admitting: Family Medicine

## 2013-12-10 ENCOUNTER — Encounter: Payer: Self-pay | Admitting: Family Medicine

## 2013-12-10 VITALS — BP 130/76 | HR 77 | Temp 98.2°F | Wt 169.2 lb

## 2013-12-10 DIAGNOSIS — N400 Enlarged prostate without lower urinary tract symptoms: Secondary | ICD-10-CM | POA: Insufficient documentation

## 2013-12-10 MED ORDER — TAMSULOSIN HCL 0.4 MG PO CAPS: 0.4000 mg | ORAL_CAPSULE | Freq: Every day | ORAL | Status: DC

## 2013-12-10 NOTE — Assessment & Plan Note (Signed)
Dec caffeine, start flomax and report back as needed. Not sign of prostatitis on exam. Likely just worsening BPH. D/w pt. Routine cautions given on med.  He can continue antihistamines at tolerated, has been on benadryl for extended period of time, needed for allergies.

## 2013-12-10 NOTE — Patient Instructions (Signed)
Start flomax daily, limit nighttime drinking and cut back on caffeine.  If not better, then notify me. Take care.

## 2013-12-10 NOTE — Progress Notes (Signed)
Pre visit review using our clinic review tool, if applicable. No additional management support is needed unless otherwise documented below in the visit note.  H/o urinary retention on some meds prev but had tolerated benadryl. Unclear if xyzal contributed to recent sx.  Noted seasonal allergy hx.   Recently with dec in stream, last few weeks.  Frequency, less UOP per void.  Feeling of incomplete voiding.  Some terminal dribbling.  Not burning with urination. Nocturia, prev was 2x/night, now more- up to 5x per night.  No other med changes.  PSA checked in fall 2014.  No pain sitting down.   Occ "twinge" of pain "near my prostate".  No fevers.   Meds, vitals, and allergies reviewed.   ROS: See HPI.  Otherwise, noncontributory.  nad Testes bilaterally descended without nodularity, tenderness or masses. No scrotal masses or lesions. No penis lesions or urethral discharge. Prostate gland firm and smooth, noted B symmetric enlargement, w/o nodularity, tenderness, mass, asymmetry or induration.

## 2013-12-26 ENCOUNTER — Encounter: Payer: Self-pay | Admitting: Internal Medicine

## 2013-12-26 ENCOUNTER — Ambulatory Visit (INDEPENDENT_AMBULATORY_CARE_PROVIDER_SITE_OTHER): Payer: 59 | Admitting: Internal Medicine

## 2013-12-26 VITALS — BP 120/82 | HR 84 | Temp 98.3°F | Wt 167.5 lb

## 2013-12-26 DIAGNOSIS — S40862A Insect bite (nonvenomous) of left upper arm, initial encounter: Secondary | ICD-10-CM

## 2013-12-26 DIAGNOSIS — W57XXXA Bitten or stung by nonvenomous insect and other nonvenomous arthropods, initial encounter: Principal | ICD-10-CM

## 2013-12-26 DIAGNOSIS — S40269A Insect bite (nonvenomous) of unspecified shoulder, initial encounter: Secondary | ICD-10-CM

## 2013-12-26 MED ORDER — DOXYCYCLINE HYCLATE 100 MG PO TABS: 100.0000 mg | ORAL_TABLET | Freq: Two times a day (BID) | ORAL | Status: DC

## 2013-12-26 NOTE — Progress Notes (Signed)
Subjective:    Patient ID: PRYCE FLY, male    DOB: Nov 20, 1945, 68 y.o.   MRN: [id number]  HPI  Pt presents to the clinic today with c/o tick bite on left arm x 2. This occurred yesterday. He was able to get both ticks off but thinks the head is still in one of the bites. He is not sure how long they were on there. He has noticed some irritation of the area. It has itching. He did put bacitracin on the area.  Review of Systems      Past Medical History  Diagnosis Date  . Diverticulosis of colon   . Depression   . Anxiety   . Hiatal hernia   . Hyperlipidemia   . Insomnia   . GERD (gastroesophageal reflux disease)     esophageal narrowing  . Internal hemorrhoids   . Anal fissure   . Glaucoma   . IBS (irritable bowel syndrome)   . Allergy   . Cancer     basal cell removed x4  . Cataract     bil eyes    Current Outpatient Prescriptions  Medication Sig Dispense Refill  . b complex vitamins capsule Take 1 capsule by mouth daily.      . Bimatoprost (LUMIGAN) 0.01 % SOLN Apply 1 drop to eye daily.        . cholecalciferol (VITAMIN D) 1000 UNITS tablet Take 1,000 Units by mouth daily.      . diphenhydrAMINE (BENADRYL) 50 MG capsule Take 50 mg by mouth at bedtime as needed.      Marland Kitchen esomeprazole (NEXIUM) 40 MG capsule TAKE 1 CAPSULE TWICE EVERY DAY  60 capsule  12  . levocetirizine (XYZAL) 5 MG tablet TAKE 1 TABLET EVERY EVENING  30 tablet  3  . sildenafil (VIAGRA) 100 MG tablet Take 0.5-1 tablets (50-100 mg total) by mouth as needed for erectile dysfunction. Take one tablet one hour before sexual activity  10 tablet  12  . tamsulosin (FLOMAX) 0.4 MG CAPS capsule Take 1 capsule (0.4 mg total) by mouth daily.  30 capsule  12  . vitamin C (ASCORBIC ACID) 500 MG tablet Take 500 mg by mouth daily.      . vitamin E 400 UNIT capsule Take 400 Units by mouth daily.      . [DISCONTINUED] traZODone (DESYREL) 50 MG tablet 0.5-1 tab at night for insomnia.  30 tablet  2   No current  facility-administered medications for this visit.    Allergies  Allergen Reactions  . Chlordiazepoxide-Clidinium     REACTION: urinary retention  . Citalopram Hydrobromide     Intolerant, tried 8/201.  "It made me very angry".  . Erythromycin Base     REACTION: stomach upset  . Hyoscyamine Sulfate     REACTION: urinary retention  . Penicillins     REACTION: Itching, rash  . Shrimp [Shellfish Allergy]     And crab- rash, GI upset, upper airway symptoms    Family History  Problem Relation Age of Onset  . Hypertension Mother   . Stroke Mother   . Pneumonia Father   . Prostate cancer Neg Hx   . Rectal cancer Neg Hx   . Cancer Maternal Aunt     ? type  . Breast cancer Maternal Aunt     mets  . Esophageal cancer Maternal Aunt     aunt ? esophageal ca  . Stomach cancer Maternal Aunt     aunt ? stmach  ca  . Colon cancer Maternal Aunt     had colostomy - not sure of origin  . Cancer Maternal Grandmother     History   Social History  . Marital Status: Divorced    Spouse Name: N/A    Number of Children: 1  . Years of Education: N/A   Occupational History  . Art therapist, retired [employer]   Social History Main Topics  . Smoking status: Former Smoker    Types: Cigarettes, Cigars    Quit date: 08/22/1998  . Smokeless tobacco: Never Used     Comment: cigarettes 1968, cigars 2000  . Alcohol Use: Yes     Comment: rare  . Drug Use: No  . Sexual Activity: Not on file   Other Topics Concern  . Not on file   Social History Narrative   Retired from [employer]   From [place]   Prev divorced    1 son   Garret Reddish '63-'67     Constitutional: Denies fever, malaise, fatigue, headache or abrupt weight changes.  Skin: Pt reports tick bite. Denies rashes, lesions or ulcercations.    No other specific complaints in a complete review of systems (except as listed in HPI above).  Objective:   Physical Exam   BP 120/82  Pulse 84  Temp(Src) 98.3 F (36.8 C)  (Oral)  Wt 167 lb 8 oz (75.978 kg)  SpO2 98% Wt Readings from Last 3 Encounters:  12/26/13 167 lb 8 oz (75.978 kg)  12/10/13 169 lb 4 oz (76.771 kg)  08/29/13 167 lb (75.751 kg)    General: Appears their stated age, well developed, well nourished in NAD. Skin: Warm, dry and intact. @ tick bites noted on posterior left arm. Cellulitis noted but no erythema migrans. Cardiovascular: Normal rate and rhythm. S1,S2 noted.  No murmur, rubs or gallops noted. No JVD or BLE edema. No carotid bruits noted. Pulmonary/Chest: Normal effort and positive vesicular breath sounds. No respiratory distress. No wheezes, rales or ronchi noted.  :  BMET    Component Value Date/Time   NA 137 10/31/2011 0902   K 4.4 10/31/2011 0902   CL 101 10/31/2011 0902   CO2 31 10/31/2011 0902   GLUCOSE 89 04/25/2013 0827   BUN 13 10/31/2011 0902   CREATININE 1.0 10/31/2011 0902   CALCIUM 8.9 10/31/2011 0902   GFRNONAA 64.61 06/23/2009 0916   GFRAA 97 06/30/2008 1049    Lipid Panel     Component Value Date/Time   CHOL 149 04/25/2013 0827   TRIG 59.0 04/25/2013 0827   HDL 40.80 04/25/2013 0827   CHOLHDL 4 04/25/2013 0827   VLDL 11.8 04/25/2013 0827   LDLCALC 96 04/25/2013 0827    CBC    Component Value Date/Time   WBC 9.1 01/17/2013 1449   RBC 4.62 01/17/2013 1449   HGB 14.2 01/17/2013 1449   HCT 41.8 01/17/2013 1449   PLT 345.0 01/17/2013 1449   MCV 90.5 01/17/2013 1449   MCHC 34.0 01/17/2013 1449   RDW 14.4 01/17/2013 1449   LYMPHSABS 1.8 01/17/2013 1449   MONOABS 0.6 01/17/2013 1449   EOSABS 0.3 01/17/2013 1449   BASOSABS 0.1 01/17/2013 1449    Hgb A1C No results found for this basename: HGBA1C        Assessment & Plan:  Tick bite:  Unable to get the head completely out Advised him it should work its way out eRx for doxy 100 mg BID x 10 day for cellulitis and tick born illness  prophylaxis  RTC as needed

## 2013-12-26 NOTE — Progress Notes (Signed)
Pre visit review using our clinic review tool, if applicable. No additional management support is needed unless otherwise documented below in the visit note. 

## 2013-12-26 NOTE — Patient Instructions (Addendum)
Tick Bite Information Ticks are insects that attach themselves to the skin and draw blood for food. There are various types of ticks. Common types include wood ticks and deer ticks. Most ticks live in shrubs and grassy areas. Ticks can climb onto your body when you make contact with leaves or grass where the tick is waiting. The most common places on the body for ticks to attach themselves are the scalp, neck, armpits, waist, and groin. Most tick bites are harmless, but sometimes ticks carry germs that cause diseases. These germs can be spread to a person during the tick's feeding process. The chance of a disease spreading through a tick bite depends on:   The type of tick.  Time of year.   How long the tick is attached.   Geographic location.  HOW CAN YOU PREVENT TICK BITES? Take these steps to help prevent tick bites when you are outdoors:  Wear protective clothing. Long sleeves and long pants are best.   Wear white clothes so you can see ticks more easily.  Tuck your pant legs into your socks.   If walking on a trail, stay in the middle of the trail to avoid brushing against bushes.  Avoid walking through areas with long grass.  Put insect repellent on all exposed skin and along boot tops, pant legs, and sleeve cuffs.   Check clothing, hair, and skin repeatedly and before going inside.   Brush off any ticks that are not attached.  Take a shower or bath as soon as possible after being outdoors.  WHAT IS THE PROPER WAY TO REMOVE A TICK? Ticks should be removed as soon as possible to help prevent diseases caused by tick bites. 1. If latex gloves are available, put them on before trying to remove a tick.  2. Using fine-point tweezers, grasp the tick as close to the skin as possible. You may also use curved forceps or a tick removal tool. Grasp the tick as close to its head as possible. Avoid grasping the tick on its body. 3. Pull gently with steady upward pressure until  the tick lets go. Do not twist the tick or jerk it suddenly. This may break off the tick's head or mouth parts. 4. Do not squeeze or crush the tick's body. This could force disease-carrying fluids from the tick into your body.  5. After the tick is removed, wash the bite area and your hands with soap and water or other disinfectant such as alcohol. 6. Apply a small amount of antiseptic cream or ointment to the bite site.  7. Wash and disinfect any instruments that were used.  Do not try to remove a tick by applying a hot match, petroleum jelly, or fingernail polish to the tick. These methods do not work and may increase the chances of disease being spread from the tick bite.  WHEN SHOULD YOU SEEK MEDICAL CARE? Contact your health care provider if you are unable to remove a tick from your skin or if a part of the tick breaks off and is stuck in the skin.  After a tick bite, you need to be aware of signs and symptoms that could be related to diseases spread by ticks. Contact your health care provider if you develop any of the following in the days or weeks after the tick bite:  Unexplained fever.  Rash. A circular rash that appears days or weeks after the tick bite may indicate the possibility of Lyme disease. The rash may resemble   a target with a bull's-eye and may occur at a different part of your body than the tick bite.  Redness and swelling in the area of the tick bite.   Tender, swollen lymph glands.   Diarrhea.   Weight loss.   Cough.   Fatigue.   Muscle, joint, or bone pain.   Abdominal pain.   Headache.   Lethargy or a change in your level of consciousness.  Difficulty walking or moving your legs.   Numbness in the legs.   Paralysis.  Shortness of breath.   Confusion.   Repeated vomiting.  Document Released: 08/05/2000 Document Revised: 05/29/2013 Document Reviewed: 01/16/2013 Exit[employer] Patient Information 2014 [employer].  

## 2014-04-29 ENCOUNTER — Other Ambulatory Visit: Payer: Self-pay | Admitting: Family Medicine

## 2014-04-29 ENCOUNTER — Other Ambulatory Visit (INDEPENDENT_AMBULATORY_CARE_PROVIDER_SITE_OTHER): Payer: Medicare Other

## 2014-04-29 DIAGNOSIS — E785 Hyperlipidemia, unspecified: Secondary | ICD-10-CM

## 2014-04-29 DIAGNOSIS — Z862 Personal history of diseases of the blood and blood-forming organs and certain disorders involving the immune mechanism: Secondary | ICD-10-CM

## 2014-04-29 DIAGNOSIS — Z8639 Personal history of other endocrine, nutritional and metabolic disease: Secondary | ICD-10-CM

## 2014-04-29 LAB — LIPID PANEL
CHOL/HDL RATIO: 4
Cholesterol: 145 mg/dL (ref 0–200)
HDL: 39 mg/dL — AB (ref >39.00)
LDL CALC: 91 mg/dL (ref 0–99)
NONHDL: 106
TRIGLYCERIDES: 73 mg/dL (ref 0.0–149.0)
VLDL: 14.6 mg/dL (ref 0.0–40.0)

## 2014-04-29 LAB — GLUCOSE, RANDOM: Glucose, Bld: 95 mg/dL (ref 70–99)

## 2014-05-01 ENCOUNTER — Ambulatory Visit (INDEPENDENT_AMBULATORY_CARE_PROVIDER_SITE_OTHER): Payer: Medicare Other | Admitting: Family Medicine

## 2014-05-01 ENCOUNTER — Encounter: Payer: Self-pay | Admitting: Family Medicine

## 2014-05-01 VITALS — BP 134/84 | HR 71 | Temp 98.1°F | Ht 69.25 in | Wt 163.5 lb

## 2014-05-01 DIAGNOSIS — Z7189 Other specified counseling: Secondary | ICD-10-CM

## 2014-05-01 DIAGNOSIS — N400 Enlarged prostate without lower urinary tract symptoms: Secondary | ICD-10-CM

## 2014-05-01 DIAGNOSIS — Z0000 Encounter for general adult medical examination without abnormal findings: Secondary | ICD-10-CM

## 2014-05-01 DIAGNOSIS — Z125 Encounter for screening for malignant neoplasm of prostate: Secondary | ICD-10-CM

## 2014-05-01 DIAGNOSIS — R972 Elevated prostate specific antigen [PSA]: Secondary | ICD-10-CM

## 2014-05-01 DIAGNOSIS — Z23 Encounter for immunization: Secondary | ICD-10-CM

## 2014-05-01 LAB — PSA, MEDICARE: PSA: 4.38 ng/ml — ABNORMAL HIGH (ref 0.10–4.00)

## 2014-05-01 NOTE — Patient Instructions (Addendum)
We can update your pneumonia booster shot later on.  Go to the lab on the way out.  We'll contact you with your lab report. Take care. Glad to see you.  Recheck in 1 year.  Notify me if the urinary symptoms get worse in the meantime.

## 2014-05-01 NOTE — Progress Notes (Addendum)
Pre visit review using our clinic review tool, if applicable. No additional management support is needed unless otherwise documented below in the visit note.  I have personally reviewed the Medicare Annual Wellness questionnaire and have noted 1. The patient's medical and social history 2. Their use of alcohol, tobacco or illicit drugs 3. Their current medications and supplements 4. The patient's functional ability including ADL's, fall risks, home safety risks and hearing or visual             impairment. 5. Diet and physical activities 6. Evidence for depression or mood disorders  The patients weight, height, BMI have been recorded in the chart and visual acuity is per eye clinic.  I have made referrals, counseling and provided education to the patient based review of the above and I have provided the pt with a written personalized care plan for preventive services.  Provider list updated- see scanned forms.  Routine anticipatory guidance given to patient.  See health maintenance.  Flu 2015 Shingles prev done PNA 2012 Tetanus 2013 Colonoscopy- d/w pt.  He has been contacted by GI and he'll check with GI about scheduling.   Prostate cancer screening- d/w pt.  He wanted screening. Discussed pros and cons of testing.   I told him I wasn't opposed to checking PSA, but I didn't want to check it w/o discussing with him the potential pros and cons first.  This was a detailed conversation, including discussion of possible direct harm to patient through testing.   Advance directive- d/w pt.  Son designated if patient were incapacitated.  Cognitive function addressed- see scanned forms- and if abnormal then additional documentation follows.   Some LUTS, some better with flomax.  Some nocturia.  It isn't clearly bothering him enough to change meds.  D/w pt.    PMH and SH reviewed  Meds, vitals, and allergies reviewed.   ROS: See HPI.  Otherwise negative.    GEN: nad, alert and  oriented HEENT: mucous membranes moist NECK: supple w/o LA CV: rrr. PULM: ctab, no inc wob ABD: soft, +bs EXT: no edema SKIN: no acute rash DRE deferred as prev done in 2015- last exam reviewed, d/w pt.

## 2014-05-02 DIAGNOSIS — Z7189 Other specified counseling: Secondary | ICD-10-CM | POA: Insufficient documentation

## 2014-05-02 DIAGNOSIS — R972 Elevated prostate specific antigen [PSA]: Secondary | ICD-10-CM | POA: Insufficient documentation

## 2014-05-02 NOTE — Assessment & Plan Note (Signed)
Flu 2015 Shingles prev done PNA 2012 Tetanus 2013 Colonoscopy- d/w pt.  He has been contacted by GI and he'll check with GI about scheduling.   Prostate cancer screening- d/w pt.  Wanted screening. Discussed pros and cons of testing.   Advance directive- d/w pt.  Son designated if patient were incapacitated.  Cognitive function addressed- see scanned forms- and if abnormal then additional documentation follows.

## 2014-05-02 NOTE — Assessment & Plan Note (Signed)
See notes on labs. 

## 2014-05-02 NOTE — Assessment & Plan Note (Signed)
Still with some LUTS, some better with flomax. Some nocturia. It isn't clearly bothering him enough to change meds. D/w pt- this involved additional time outside of the time dedicated to medicare exam, >15 minutes spent in face to face time with patient, >50% spent in counselling or coordination of care.  See notes on labs.

## 2014-05-05 NOTE — Progress Notes (Signed)
Left message on VM for pt to return call.

## 2014-05-07 ENCOUNTER — Encounter: Payer: Self-pay | Admitting: Internal Medicine

## 2014-05-12 ENCOUNTER — Encounter: Payer: Self-pay | Admitting: Internal Medicine

## 2014-06-09 ENCOUNTER — Encounter: Payer: Self-pay | Admitting: Internal Medicine

## 2014-07-29 ENCOUNTER — Encounter: Payer: Medicare Other | Admitting: Gastroenterology

## 2014-07-30 ENCOUNTER — Ambulatory Visit (INDEPENDENT_AMBULATORY_CARE_PROVIDER_SITE_OTHER): Payer: 59 | Admitting: Family Medicine

## 2014-07-30 ENCOUNTER — Telehealth: Payer: Self-pay | Admitting: Family Medicine

## 2014-07-30 ENCOUNTER — Telehealth: Payer: Self-pay

## 2014-07-30 VITALS — BP 132/80 | HR 80 | Temp 98.9°F | Resp 18 | Ht 70.0 in | Wt 168.0 lb

## 2014-07-30 DIAGNOSIS — R1032 Left lower quadrant pain: Secondary | ICD-10-CM

## 2014-07-30 DIAGNOSIS — K5732 Diverticulitis of large intestine without perforation or abscess without bleeding: Secondary | ICD-10-CM

## 2014-07-30 MED ORDER — CIPROFLOXACIN HCL 500 MG PO TABS: 500.0000 mg | ORAL_TABLET | Freq: Two times a day (BID) | ORAL | Status: DC

## 2014-07-30 NOTE — Telephone Encounter (Signed)
Agreed, UC.  Thanks.

## 2014-07-30 NOTE — Telephone Encounter (Signed)
Pt called back and wanted to know what Dr Damita Dunnings recommended. I explained to the pt that Dr Damita Dunnings nor any other provider here at our location had an open appt for today. I told the pt that we could get him an appt at [facility], but pt stated he did not want to go that far. I informed the pt that Dr Damita Dunnings said he go to an UC. Pt did not want to do that because he was afraid he would be transferred/admitted to [facility] and they would not have his records. I informed the pt that IF he were admitted, [facility] is linked with the [facility], but that he he felt that bad, he needed to go to an Urgent Care to be seen today. I informed the pt that [facility] had an Acute Care that he could go to now. The pt was very appreciative after a long talk, but wanted to feel better. When we hung up pt was going to the Acute Care. Please advise. Thank you!

## 2014-07-30 NOTE — Progress Notes (Addendum)
This chart was scribed for [name], MD by Tonye Royalty, ED Scribe. This patient was seen in room 10.     Patient ID: Mason [name] MRN: [id number], DOB: [birth year], 68 y.o. Date of Encounter: 07/30/2014, 6:44 PM  Primary Physician: [name], MD  Chief Complaint: flank pain  HPI: 68 y.o. year old male with history below significant for diverticulosis presents with LLQ abdominal pain with onset yesterday. He reports associated constipation; he notes he normally has frequent, regular bowel movements. He notes a certain point in his left side that is particularly tender. He states pain radiated across his abdomen with prior diverticulitis but states it is not radiating now. He denies urinary symptoms or fever. He states he has been eating popcorn this past week. He states he uses a probiotic and Activia, which has seemed to improve chronic problems. He states he has used Cipro before.  Past Medical History  Diagnosis Date  . Diverticulosis of colon   . Depression   . Anxiety   . Hiatal hernia   . Hyperlipidemia   . Insomnia   . GERD (gastroesophageal reflux disease)     esophageal narrowing  . Internal hemorrhoids   . Anal fissure   . Glaucoma   . IBS (irritable bowel syndrome)   . Allergy   . Cancer     basal cell removed x4  . Cataract     bil eyes     Home Meds: Prior to Admission medications   Medication Sig Start Date End Date Taking? Authorizing Provider  b complex vitamins capsule Take 1 capsule by mouth daily.   Yes Historical Provider, MD  Bimatoprost (LUMIGAN) 0.01 % SOLN Apply 1 drop to eye daily.     Yes Historical Provider, MD  cholecalciferol (VITAMIN D) 1000 UNITS tablet Take 1,000 Units by mouth daily.   Yes Historical Provider, MD  diphenhydrAMINE (BENADRYL) 50 MG capsule Take 50 mg by mouth at bedtime as needed.   Yes Historical Provider, MD  esomeprazole (NEXIUM) 40 MG capsule TAKE 1 CAPSULE TWICE EVERY DAY 06/28/13  Yes Joaquim Nam, MD    sildenafil (VIAGRA) 100 MG tablet Take 0.5-1 tablets (50-100 mg total) by mouth as needed for erectile dysfunction. Take one tablet one hour before sexual activity 04/29/13  Yes Joaquim Nam, MD  tamsulosin (FLOMAX) 0.4 MG CAPS capsule Take 1 capsule (0.4 mg total) by mouth daily. 12/10/13  Yes Joaquim Nam, MD  vitamin C (ASCORBIC ACID) 500 MG tablet Take 500 mg by mouth daily.   Yes Historical Provider, MD  vitamin E 400 UNIT capsule Take 400 Units by mouth daily.   Yes Historical Provider, MD    Allergies:  Allergies  Allergen Reactions  . Chlordiazepoxide-Clidinium     REACTION: urinary retention  . Citalopram Hydrobromide     Intolerant, tried 8/201.  "It made me very angry".  . Erythromycin Base     REACTION: stomach upset  . Hyoscyamine Sulfate     REACTION: urinary retention  . Penicillins     REACTION: Itching, rash  . Shrimp [Shellfish Allergy]     And crab- rash, GI upset, upper airway symptoms    History   Social History  . Marital Status: Divorced    Spouse Name: N/A    Number of Children: 1  . Years of Education: N/A   Occupational History  . Art therapist, retired [employer]   Social History Main Topics  . Smoking status: Former Smoker  Types: Cigarettes, Cigars    Quit date: 08/22/1998  . Smokeless tobacco: Never Used     Comment: cigarettes 1968, cigars 2000  . Alcohol Use: Yes     Comment: rare  . Drug Use: No  . Sexual Activity: Not on file   Other Topics Concern  . Not on file   Social History Narrative   Retired from [employer]   From [place]   Prev divorced    1 son   Garret Reddish '63-'67, no known agent orange exposure     Review of Systems: Constitutional: negative for chills, fever, night sweats, weight changes, or fatigue  HEENT: negative for vision changes, hearing loss, congestion, rhinorrhea, ST, epistaxis, or sinus pressure Cardiovascular: negative for chest pain or palpitations Respiratory: negative for hemoptysis,  wheezing, shortness of breath, or cough Abdominal: negative for nausea, vomiting, diarrhea, or constipation, positive for abdominal pain Dermatological: negative for rash Neurologic: negative for headache, dizziness, or syncope All other systems reviewed and are otherwise negative with the exception to those above and in the HPI.   Physical Exam: Blood pressure 132/80, pulse 80, temperature 98.9 F (37.2 C), temperature source Oral, resp. rate 18, height 5\' 10"  (1.778 m), weight 168 lb (76.204 kg), SpO2 97 %., Body mass index is 24.11 kg/(m^2). General: Well developed, well nourished, in no acute distress. Head: Normocephalic, atraumatic, eyes without discharge, sclera non-icteric, nares are without discharge. Bilateral auditory canals clear, TM's are without perforation, pearly grey and translucent with reflective cone of light bilaterally. Oral cavity moist, posterior pharynx without exudate, erythema, peritonsillar abscess, or post nasal drip.  Neck: Supple. No thyromegaly. Full ROM. No lymphadenopathy. Lungs: Clear bilaterally to auscultation without wheezes, rales, or rhonchi. Breathing is unlabored. Heart: RRR with S1 S2. No murmurs, rubs, or gallops appreciated. Abdomen: Soft, tender LLQ withoug mass, non-distended with normoactive bowel sounds. No hepatomegaly. No rebound/guarding. No obvious abdominal masses. Msk:  Strength and tone normal for age. Extremities/Skin: Warm and dry. No clubbing or cyanosis. No edema. No rashes or suspicious lesions. Neuro: Alert and oriented X 3. Moves all extremities spontaneously. Gait is normal. CNII-XII grossly in tact. Psych:  Responds to questions appropriately with a normal affect.     ASSESSMENT AND PLAN:   This chart was scribed in my presence and reviewed by me personally.    ICD-9-CM ICD-10-CM   1. LLQ pain 789.04 R10.32    LLQ pain  Diverticulitis of colon cipro 500 bid x 10 days Call if not improving in 48 hours..may need to add  flagyl No more popcorn    Signed, [name], MD    Signed, [name], MD 07/30/2014 6:44 PM

## 2014-07-30 NOTE — Telephone Encounter (Signed)
If we don't have a slot today, then I would go to UC.  That would be quicker than adding him on.  I wouldn't wait until tomorrow.  Thanks.

## 2014-07-30 NOTE — Telephone Encounter (Signed)
Patient notified as instructed by telephone. Patient stated that he is close to [facility] and will go there because he does not want to drive to [place].

## 2014-07-30 NOTE — Patient Instructions (Signed)
If the antibiotic is not improving things in 48 hours, call me or return No popcorn Plenty of fluids   Diverticulitis Diverticulitis is inflammation or infection of small pouches in your colon that form when you have a condition called diverticulosis. The pouches in your colon are called diverticula. Your colon, or large intestine, is where water is absorbed and stool is formed. Complications of diverticulitis can include:  Bleeding.  Severe infection.  Severe pain.  Perforation of your colon.  Obstruction of your colon. CAUSES  Diverticulitis is caused by bacteria. Diverticulitis happens when stool becomes trapped in diverticula. This allows bacteria to grow in the diverticula, which can lead to inflammation and infection. RISK FACTORS People with diverticulosis are at risk for diverticulitis. Eating a diet that does not include enough fiber from fruits and vegetables may make diverticulitis more likely to develop. SYMPTOMS  Symptoms of diverticulitis may include:  Abdominal pain and tenderness. The pain is normally located on the left side of the abdomen, but may occur in other areas.  Fever and chills.  Bloating.  Cramping.  Nausea.  Vomiting.  Constipation.  Diarrhea.  Blood in your stool. DIAGNOSIS  Your health care provider will ask you about your medical history and do a physical exam. You may need to have tests done because many medical conditions can cause the same symptoms as diverticulitis. Tests may include:  Blood tests.  Urine tests.  Imaging tests of the abdomen, including X-rays and CT scans. When your condition is under control, your health care provider may recommend that you have a colonoscopy. A colonoscopy can show how severe your diverticula are and whether something else is causing your symptoms. TREATMENT  Most cases of diverticulitis are mild and can be treated at home. Treatment may include:  Taking over-the-counter pain  medicines.  Following a clear liquid diet.  Taking antibiotic medicines by mouth for 7-10 days. More severe cases may be treated at a hospital. Treatment may include:  Not eating or drinking.  Taking prescription pain medicine.  Receiving antibiotic medicines through an IV tube.  Receiving fluids and nutrition through an IV tube.  Surgery. HOME CARE INSTRUCTIONS   Follow your health care provider's instructions carefully.  Follow a full liquid diet or other diet as directed by your health care provider. After your symptoms improve, your health care provider may tell you to change your diet. He or she may recommend you eat a high-fiber diet. Fruits and vegetables are good sources of fiber. Fiber makes it easier to pass stool.  Take fiber supplements or probiotics as directed by your health care provider.  Only take medicines as directed by your health care provider.  Keep all your follow-up appointments. SEEK MEDICAL CARE IF:   Your pain does not improve.  You have a hard time eating food.  Your bowel movements do not return to normal. SEEK IMMEDIATE MEDICAL CARE IF:   Your pain becomes worse.  Your symptoms do not get better.  Your symptoms suddenly get worse.  You have a fever.  You have repeated vomiting.  You have bloody or black, tarry stools. MAKE SURE YOU:   Understand these instructions.  Will watch your condition.  Will get help right away if you are not doing well or get worse. Document Released: 05/18/2005 Document Revised: 08/13/2013 Document Reviewed: 07/03/2013 [facility] Patient Information 2015 [place], [place]. This information is not intended to replace advice given to you by your health care provider. Make sure you discuss any questions  you have with your health care provider.

## 2014-07-30 NOTE — Telephone Encounter (Signed)
PLEASE NOTE: All timestamps contained within this report are represented as [place] Standard Time. CONFIDENTIALTY NOTICE: This fax transmission is intended only for the addressee. It contains information that is legally privileged, confidential or otherwise protected from use or disclosure. If you are not the intended recipient, you are strictly prohibited from reviewing, disclosing, copying using or disseminating any of this information or taking any action in reliance on or regarding this information. If you have received this fax in error, please notify us immediately by telephone so that we can arrange for its return to [place]. Phone: [phone], Toll-Free: [phone], Fax: [phone] Page: 1 of 2 Call Id: [id number] [place] [facility] Day - Client TELEPHONE ADVICE RECORD [facility] Call [facility] Patient Name: Mason [name] Gender: Male DOB: 1946-05-14 Age: 68 Y 11 M 9 D Return Phone Number: [phone] (Primary) Address[name] [address] Client [facility] Day - Client Client Site [facility] [place] - Day Physician Raechel Ache Contact Type Call Call Type Triage / Clinical Relationship To Patient Self Return Phone Number [phone] (Primary) Chief Complaint Flank Pain Initial Comment Caller states he has sharp side pain, has diverticulitis, xfered from office for triage PreDisposition Home Care Nurse Assessment Nurse: Charna Elizabeth, RN, Lynden Ang Date/Time Lamount Cohen Time): 07/30/2014 11:25:07 AM Confirm and document reason for call. If symptomatic, describe symptoms. ---Caller states he developed left flank pain yesterday. No fever. No injury in the past 3 days. Has the patient traveled out of the country within the last 30 days? ---No Does the patient require triage? ---Yes Related visit to physician within the last 2 weeks? ---No Does the PT have any chronic conditions? (i.e. diabetes, asthma,  etc.) ---Yes List chronic conditions. ---Diverticulitis, Glaucoma, Urinary problems, Acid Reflux Guidelines Guideline Title Affirmed Question Affirmed Notes Nurse Date/Time (Eastern Time) Flank Pain [1] Sudden onset of severe flank pain AND [2] age > 13 [place], RN, Cathy 07/30/2014 11:27:03 AM Disp. Time Lamount Cohen Time) Disposition Final User 07/30/2014 11:30:03 AM Go to ED Now Yes Charna Elizabeth, RN, Frann Rider Understands: Yes Disagree/Comply: Comply Care Advice Given Per Guideline PLEASE NOTE: All timestamps contained within this report are represented as [place] Standard Time. CONFIDENTIALTY NOTICE: This fax transmission is intended only for the addressee. It contains information that is legally privileged, confidential or otherwise protected from use or disclosure. If you are not the intended recipient, you are strictly prohibited from reviewing, disclosing, copying using or disseminating any of this information or taking any action in reliance on or regarding this information. If you have received this fax in error, please notify us immediately by telephone so that we can arrange for its return to [place]. Phone: [phone], Toll-Free: [phone], Fax: [phone] Page: 2 of 2 Call Id: [id number] Care Advice Given Per Guideline GO TO ED NOW: You need to be seen in the Emergency Department. Go to the ER at ___________ Hospital. Leave now. Drive carefully. DRIVING: Another adult should drive. BRING MEDICINES: * Please bring a list of your current medicines when you go to the Emergency Department (ER). * It is also a good idea to bring the pill bottles too. This will help the doctor to make certain you are taking the right medicines and the right dose. CARE ADVICE given per Flank Pain (Adult) guideline. After Care Instructions Given Call Event Type User Date / Time Description Comments User: Colonel Bald, RN Date/Time Lamount Cohen Time): 07/30/2014 11:35:54 AM Per profile directives: Call  backline [phone] Option 8) to check for availability  of appointment within the next 4 hours. Carollee Herter transferred me to [place], office nurse who will assist him further. Referrals GO TO FACILITY OTHER - SPECIFY

## 2014-07-30 NOTE — Telephone Encounter (Signed)
[facility] transferred pt to office;Pt has hx of diverticulitis; on 07/29/14 started with consistent lt flank pain; pain varies from dull ache to severe pain; pain level now  6-7. No fever, no vomiting and no diarrhea. Pt thinks this is the beginning of diverticulitis attack. [facility]. Pt request appt to be seen at [facility] today; pt does not want appt tomorrow for fear pain will worsen tonight. No available appts today and offered appt at [facility] and pt did not want to drive that far and does not want to go to UC. Pt request cb. Advised pt if condition changes or worsens prior to cb to go to UC> pt voiced understanding.

## 2014-08-04 ENCOUNTER — Other Ambulatory Visit (INDEPENDENT_AMBULATORY_CARE_PROVIDER_SITE_OTHER): Payer: Medicare Other

## 2014-08-04 DIAGNOSIS — Z125 Encounter for screening for malignant neoplasm of prostate: Secondary | ICD-10-CM

## 2014-08-04 LAB — PSA, MEDICARE: PSA: 3.75 ng/ml (ref 0.10–4.00)

## 2014-08-05 ENCOUNTER — Other Ambulatory Visit: Payer: Self-pay | Admitting: Family Medicine

## 2014-08-13 ENCOUNTER — Encounter: Payer: Medicare Other | Admitting: Internal Medicine

## 2014-08-18 ENCOUNTER — Ambulatory Visit (AMBULATORY_SURGERY_CENTER): Payer: Self-pay | Admitting: *Deleted

## 2014-08-18 VITALS — Ht 69.5 in | Wt 171.2 lb

## 2014-08-18 DIAGNOSIS — Z800 Family history of malignant neoplasm of digestive organs: Secondary | ICD-10-CM

## 2014-08-18 MED ORDER — MOVIPREP 100 G PO SOLR: 1.0000 | Freq: Once | ORAL | Status: DC

## 2014-08-18 NOTE — Progress Notes (Signed)
No home 02 use. ewm  No blood thinners, no diet pills. ewm  No egg or soy allergy. ewm  No issues with past sedation. ewm  emmi video to pt's e mail. ewm

## 2014-08-25 ENCOUNTER — Encounter: Payer: Self-pay | Admitting: Internal Medicine

## 2014-08-25 ENCOUNTER — Ambulatory Visit (AMBULATORY_SURGERY_CENTER): Payer: Medicare Other | Admitting: Internal Medicine

## 2014-08-25 VITALS — BP 158/96 | HR 72 | Temp 97.2°F | Resp 12 | Ht 69.5 in | Wt 171.0 lb

## 2014-08-25 DIAGNOSIS — Z1211 Encounter for screening for malignant neoplasm of colon: Secondary | ICD-10-CM

## 2014-08-25 DIAGNOSIS — Z800 Family history of malignant neoplasm of digestive organs: Secondary | ICD-10-CM

## 2014-08-25 DIAGNOSIS — K5730 Diverticulosis of large intestine without perforation or abscess without bleeding: Secondary | ICD-10-CM

## 2014-08-25 MED ORDER — SODIUM CHLORIDE 0.9 % IV SOLN: 500.0000 mL | INTRAVENOUS | Status: DC

## 2014-08-25 NOTE — Progress Notes (Signed)
No problems noted in the recovery room. maw 

## 2014-08-25 NOTE — Progress Notes (Signed)
A/ox3 pleased with MAC, report to Annette RN 

## 2014-08-25 NOTE — Patient Instructions (Signed)

## 2014-08-25 NOTE — Op Note (Signed)
[place]  [address] [place], [address]   COLONOSCOPY PROCEDURE REPORT  PATIENT: Mason [name], Mason [name]  MR#: [id number] BIRTHDATE: 1946/07/09 , 68  yrs. old GENDER: male ENDOSCOPIST: Eustace Quail, MD REFERRED XB:[id number] Recall,. PROCEDURE DATE:  08/25/2014 PROCEDURE:   Colonoscopy, screening First Screening Colonoscopy - Avg.  risk and is 50 yrs.  old or older - No.  Prior Negative Screening - Now for repeat screening. Less than 10 yrs Prior Negative Screening - Now for repeat screening.  Above average risk  History of Adenoma - Now for follow-up colonoscopy & has been > or = to 3 yrs.  N/A  Polyps Removed Today? No.  Polyps Removed Today? No.  Recommend repeat exam, <10 yrs? No. ASA CLASS:   Class II INDICATIONS:average risk for colorectal cancer.Previously reported family history of colon cancer. today, denies any first degree relatives. possibly distant relative, but not certain. Prior negative exams 2000, 2005, 2010 MEDICATIONS: Monitored anesthesia care and Propofol 200 mg IV  DESCRIPTION OF PROCEDURE:   After the risks benefits and alternatives of the procedure were thoroughly explained, informed consent was obtained.  The digital rectal exam revealed no abnormalities of the rectum.   The LB [id number]  endoscope was introduced through the anus and advanced to the cecum, which was identified by both the appendix and ileocecal valve. No adverse events experienced.   The quality of the prep was excellent, using MoviPrep  The instrument was then slowly withdrawn as the colon was fully examined.      COLON FINDINGS: There was severe diverticulosis noted in the left colon.   The examination was otherwise normal.  Retroflexed views revealed internal hemorrhoids. The time to cecum=2 minutes 39 seconds.  Withdrawal time=10 minutes 23 seconds.  The scope was withdrawn and the procedure completed.  COMPLICATIONS: There were no immediate  complications.  ENDOSCOPIC IMPRESSION: 1.   Severe diverticulosis was noted in the left colon 2.   The examination was otherwise normal  RECOMMENDATIONS: 1. Return to the care of your primary provider.  GI follow up as needed  eSigned:  Eustace Quail, MD 08/25/2014 9:36 AM   cc: Elsie Stain, MD and The Patient

## 2014-08-26 ENCOUNTER — Telehealth: Payer: Self-pay | Admitting: *Deleted

## 2014-08-26 NOTE — Telephone Encounter (Signed)
  Follow up Call-  Call back number 08/25/2014 08/29/2013  Post procedure Call Back phone  # [phone] cell  Permission to leave phone message Yes Yes     Patient questions:  Do you have a fever, pain , or abdominal swelling? No. Pain Score  0 *  Have you tolerated food without any problems? Yes.    Have you been able to return to your normal activities? Yes.    Do you have any questions about your discharge instructions: Diet   No. Medications  No. Follow up visit  No.  Do you have questions or concerns about your Care? No.  Actions: * If pain score is 4 or above: No action needed, pain <4.

## 2014-08-28 ENCOUNTER — Encounter: Payer: Self-pay | Admitting: Family Medicine

## 2014-08-28 ENCOUNTER — Ambulatory Visit (INDEPENDENT_AMBULATORY_CARE_PROVIDER_SITE_OTHER): Payer: Medicare Other | Admitting: Family Medicine

## 2014-08-28 VITALS — BP 106/70 | HR 74 | Temp 98.4°F | Ht 70.0 in | Wt 170.0 lb

## 2014-08-28 DIAGNOSIS — M722 Plantar fascial fibromatosis: Secondary | ICD-10-CM

## 2014-08-28 DIAGNOSIS — Z23 Encounter for immunization: Secondary | ICD-10-CM

## 2014-08-28 NOTE — Progress Notes (Signed)
Pre visit review using our clinic review tool, if applicable. No additional management support is needed unless otherwise documented below in the visit note. 

## 2014-08-28 NOTE — Progress Notes (Signed)
Dr. Karleen Hampshire T. Lynasia Meloche, MD, CAQ [facility] [address] [place], [address] Phone: [phone] Fax: [phone]  08/28/2014  Patient: Mason [name], MRN: [id number], DOB: 01/05/1946, 69 y.o.  Primary Physician:  [name], MD  Chief Complaint: Foot Pain  Subjective:   This 69 y.o. male patient presents with a 2 month long history of heel pain. This is notable for worsening pain first thing in the morning when arising and standing after sitting.   Left heel. 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  All on the left side.   Prior foot or ankle fractures: none Prior operations: none Orthotics or bracing: none Medications: none PT or home rehab: none Night splints: no Ice massage: no Ball massage: no  Metatarsal pain: no  The PMH, PSH, Social History, Family History, Medications, and allergies have been reviewed in [facility], and have been updated if relevant.  GEN: No fevers, chills. Nontoxic. Primarily MSK c/o today. MSK: Detailed in the HPI GI: tolerating PO intake without difficulty Neuro: No numbness, parasthesias, or tingling associated. Otherwise the pertinent positives of the ROS are noted above.   Objective:   Blood pressure 106/70, pulse 74, temperature 98.4 F (36.9 C), temperature source Oral, height 5\' 10"  (1.778 m), weight 170 lb (77.111 kg).  GEN: Well-developed,well-nourished,in no acute distress; alert,appropriate and cooperative throughout examination HEENT: Normocephalic and atraumatic without obvious abnormalities. Ears, externally no deformities PULM: Breathing comfortably in no respiratory distress EXT: No clubbing, cyanosis, or edema PSYCH: Normally interactive. Cooperative during the interview. Pleasant. Friendly and conversant. Not anxious or depressed appearing. Normal, full affect.  Echymosis: no Edema: no ROM: full LE B Gait: heel toe, non-antalgic MT pain: no Callus  pattern: none Lateral Mall: NT Medial Mall: NT Talus: NT Navicular: NT Calcaneous: NT Metatarsals: NT 5th MT: NT Phalanges: NT Achilles: NT Plantar Fascia: tender, medial along PF. Pain with forced dorsi Fat Pad: NT Peroneals: NT Post Tib: NT Great Toe: Nml motion Ant Drawer: neg Other foot breakdown: none Long arch: pes cavus b Transverse arch: preserved Hindfoot breakdown: none Sensation: intact  Assessment and Plan:   Plantar fasciitis, left  Need for prophylactic vaccination against Streptococcus pneumoniae (pneumococcus) - Plan: Pneumococcal conjugate vaccine 13-valent  >25 minutes spent in face to face time with patient, >50% spent in counselling or coordination of care  Anatomy reviewed. Stretching and rehab are critically important to the treatment of PF. Reviewed footwear. Rigid soles have been shown to help with PF.  Reviewed rehab of stretching and calf raises.  Reviewed rehab from [employer]  Could benefit from a corticosteroid injection if conservative treatment fails.  Refer to the patient instructions sections for details of plan shared with patient.   Patient Instructions  Please read handouts on Plantar Fascitis.  STRETCHING and Strengthening program critically important.  Strengthening on foot and calf muscles as seen in handout. Calf raises, 2 legged, then 1 legged. Foot massage with tennis ball. Ice massage.  Towel Scrunches: get a towel or hand towel, use toes to pick up and scrunch up the towel.  Marble pick-ups, practice picking up marbles with toes and placing into a cup  NEEDS TO BE DONE EVERY DAY  Recommended over the counter insoles. (Spenco or Hapad)  A rigid shoe with good arch support helps: Dansko (great), Randel Pigg, Merrell No easily bendable shoes.   Tuli's heel cups  Follow-up: No Follow-up on file.  New Prescriptions   No medications on file   Orders Placed This Encounter    Procedures  . Pneumococcal conjugate vaccine 13-valent    Signed,  Ajax Schroll T. Willey Due, MD   Patient's Medications  New Prescriptions   No medications on file  Previous Medications   B COMPLEX VITAMINS CAPSULE    Take 1 capsule by mouth daily.   BIMATOPROST (LUMIGAN) 0.01 % SOLN    Apply 1 drop to eye daily.     CHOLECALCIFEROL (VITAMIN D) 1000 UNITS TABLET    Take 1,000 Units by mouth daily.   DIPHENHYDRAMINE (BENADRYL) 50 MG CAPSULE    Take 50 mg by mouth at bedtime as needed.   ESOMEPRAZOLE (NEXIUM) 40 MG CAPSULE    TAKE 1 CAPSULE BY MOUTH TWICE EVERY DAY   OVER THE COUNTER MEDICATION    Take 1 capsule by mouth daily. macu guard ocular support   SAW PALMETTO 160 MG CAPSULE    Take 160 mg by mouth 2 (two) times daily.   SILDENAFIL (VIAGRA) 100 MG TABLET    Take 0.5-1 tablets (50-100 mg total) by mouth as needed for erectile dysfunction. Take one tablet one hour before sexual activity   TAMSULOSIN (FLOMAX) 0.4 MG CAPS CAPSULE    Take 1 capsule (0.4 mg total) by mouth daily.   VITAMIN C (ASCORBIC ACID) 500 MG TABLET    Take 500 mg by mouth daily.   VITAMIN E 400 UNIT CAPSULE    Take 400 Units by mouth daily.  Modified Medications   No medications on file  Discontinued Medications   No medications on file

## 2014-08-28 NOTE — Patient Instructions (Signed)
Please read handouts on Plantar Fascitis.  STRETCHING and Strengthening program critically important.  Strengthening on foot and calf muscles as seen in handout. Calf raises, 2 legged, then 1 legged. Foot massage with tennis ball. Ice massage.  Towel Scrunches: get a towel or hand towel, use toes to pick up and scrunch up the towel.  Marble pick-ups, practice picking up marbles with toes and placing into a cup  NEEDS TO BE DONE EVERY DAY  Recommended over the counter insoles. (Spenco or Hapad)  A rigid shoe with good arch support helps: Dansko (great), Keen, Merrell No easily bendable shoes.   Tuli's heel cups  

## 2014-12-18 ENCOUNTER — Other Ambulatory Visit: Payer: Self-pay | Admitting: Family Medicine

## 2014-12-22 ENCOUNTER — Other Ambulatory Visit: Payer: Self-pay | Admitting: Family Medicine

## 2014-12-22 NOTE — Telephone Encounter (Signed)
Electronic refill request. Medication is not currently on meds list.  Please advise.

## 2014-12-23 NOTE — Telephone Encounter (Signed)
Had been on prev, okay to restart.  Sent.  Thanks.

## 2015-05-03 ENCOUNTER — Other Ambulatory Visit: Payer: Self-pay | Admitting: Family Medicine

## 2015-05-03 DIAGNOSIS — Z131 Encounter for screening for diabetes mellitus: Secondary | ICD-10-CM

## 2015-05-03 DIAGNOSIS — Z125 Encounter for screening for malignant neoplasm of prostate: Secondary | ICD-10-CM

## 2015-05-08 ENCOUNTER — Other Ambulatory Visit (INDEPENDENT_AMBULATORY_CARE_PROVIDER_SITE_OTHER): Payer: Medicare Other

## 2015-05-08 DIAGNOSIS — Z125 Encounter for screening for malignant neoplasm of prostate: Secondary | ICD-10-CM

## 2015-05-08 DIAGNOSIS — Z131 Encounter for screening for diabetes mellitus: Secondary | ICD-10-CM

## 2015-05-08 LAB — PSA, MEDICARE: PSA: 3.41 ng/ml (ref 0.10–4.00)

## 2015-05-08 LAB — GLUCOSE, RANDOM: GLUCOSE: 91 mg/dL (ref 70–99)

## 2015-05-13 ENCOUNTER — Encounter: Payer: Self-pay | Admitting: Family Medicine

## 2015-05-13 ENCOUNTER — Ambulatory Visit (INDEPENDENT_AMBULATORY_CARE_PROVIDER_SITE_OTHER): Payer: Medicare Other | Admitting: Family Medicine

## 2015-05-13 VITALS — BP 126/80 | HR 60 | Temp 97.9°F | Ht 69.5 in | Wt 170.2 lb

## 2015-05-13 DIAGNOSIS — N400 Enlarged prostate without lower urinary tract symptoms: Secondary | ICD-10-CM

## 2015-05-13 DIAGNOSIS — Z0000 Encounter for general adult medical examination without abnormal findings: Secondary | ICD-10-CM | POA: Diagnosis not present

## 2015-05-13 DIAGNOSIS — Z23 Encounter for immunization: Secondary | ICD-10-CM

## 2015-05-13 NOTE — Progress Notes (Signed)
Pre visit review using our clinic review tool, if applicable. No additional management support is needed unless otherwise documented below in the visit note.  I have personally reviewed the Medicare Annual Wellness questionnaire and have noted 1. The patient's medical and social history 2. Their use of alcohol, tobacco or illicit drugs 3. Their current medications and supplements 4. The patient's functional ability including ADL's, fall risks, home safety risks and hearing or visual             impairment. 5. Diet and physical activities 6. Evidence for depression or mood disorders  The patients weight, height, BMI have been recorded in the chart and visual acuity is per eye clinic.  I have made referrals, counseling and provided education to the patient based review of the above and I have provided the pt with a written personalized care plan for preventive services.  Provider list updated- see scanned forms.  Routine anticipatory guidance given to patient.  See health maintenance.  Flu 2016 Shingles done at [facility] prev.  PNA 2016 Tetanus 2013 Colonoscopy 2016 PSA wnl. D/w pt.  See below.   Advance directive- son designated if patient were incapacitated.   Cognitive function addressed- see scanned forms- and if abnormal then additional documentation follows.  HCV testing neg per patient report, done last year at [facility] clinic.   More LUTS with nocturia, less help now from flomax, d/w pt about options- this was a detailed conversation about options at this point.  Other meds/inc dose of flomax would likely be counterproductive.  He finally agreed that he would like referral, ordered at [place].    PMH and SH reviewed  Meds, vitals, and allergies reviewed.   ROS: See HPI.  Otherwise negative.    GEN: nad, alert and oriented HEENT: mucous membranes moist NECK: supple w/o LA CV: rrr. PULM: ctab, no inc wob ABD: soft, +bs EXT: no edema SKIN: no acute rash DRE deferred.

## 2015-05-13 NOTE — Patient Instructions (Addendum)
Mason [name] will call about your referral. Check to see if you need refills.   Take care.  Glad to see you.

## 2015-05-14 NOTE — Assessment & Plan Note (Addendum)
Flu 2016 Shingles done at [facility] prev.  PNA 2016 Tetanus 2013 Colonoscopy 2016 PSA wnl. D/w pt.  See below.   Advance directive- son designated if patient were incapacitated.   Cognitive function addressed- see scanned forms- and if abnormal then additional documentation follows.  HCV testing neg per patient report, done last year at [facility] clinic.  Sugar wnl.  No other labs needed before this OV, d/w pt.

## 2015-05-14 NOTE — Assessment & Plan Note (Signed)
With LUTS, with nocturia, less help now from flomax, d/w pt about options- this was a detailed conversation about options at this point.  Other meds/inc dose of flomax would likely be counterproductive.  He finally agreed that he would like referral, ordered at [place].  App uro help.

## 2015-08-18 ENCOUNTER — Ambulatory Visit (INDEPENDENT_AMBULATORY_CARE_PROVIDER_SITE_OTHER): Payer: Medicare Other | Admitting: Family Medicine

## 2015-08-18 VITALS — BP 148/88 | HR 74 | Temp 97.7°F | Resp 16 | Ht 69.5 in | Wt 175.0 lb

## 2015-08-18 DIAGNOSIS — K5732 Diverticulitis of large intestine without perforation or abscess without bleeding: Secondary | ICD-10-CM

## 2015-08-18 MED ORDER — CIPROFLOXACIN HCL 500 MG PO TABS: 500.0000 mg | ORAL_TABLET | Freq: Two times a day (BID) | ORAL | Status: DC

## 2015-08-18 NOTE — Patient Instructions (Signed)
Diverticulitis °Diverticulitis is inflammation or infection of small pouches in your colon that form when you have a condition called diverticulosis. The pouches in your colon are called diverticula. Your colon, or large intestine, is where water is absorbed and stool is formed. °Complications of diverticulitis can include: °· Bleeding. °· Severe infection. °· Severe pain. °· Perforation of your colon. °· Obstruction of your colon. °CAUSES  °Diverticulitis is caused by bacteria. °Diverticulitis happens when stool becomes trapped in diverticula. This allows bacteria to grow in the diverticula, which can lead to inflammation and infection. °RISK FACTORS °People with diverticulosis are at risk for diverticulitis. Eating a diet that does not include enough fiber from fruits and vegetables may make diverticulitis more likely to develop. °SYMPTOMS  °Symptoms of diverticulitis may include: °· Abdominal pain and tenderness. The pain is normally located on the left side of the abdomen, but may occur in other areas. °· Fever and chills. °· Bloating. °· Cramping. °· Nausea. °· Vomiting. °· Constipation. °· Diarrhea. °· Blood in your stool. °DIAGNOSIS  °Your health care provider will ask you about your medical history and do a physical exam. You may need to have tests done because many medical conditions can cause the same symptoms as diverticulitis. Tests may include: °· Blood tests. °· Urine tests. °· Imaging tests of the abdomen, including X-rays and CT scans. °When your condition is under control, your health care provider may recommend that you have a colonoscopy. A colonoscopy can show how severe your diverticula are and whether something else is causing your symptoms. °TREATMENT  °Most cases of diverticulitis are mild and can be treated at home. Treatment may include: °· Taking over-the-counter pain medicines. °· Following a clear liquid diet. °· Taking antibiotic medicines by mouth for 7-10 days. °More severe cases may  be treated at a hospital. Treatment may include: °· Not eating or drinking. °· Taking prescription pain medicine. °· Receiving antibiotic medicines through an IV tube. °· Receiving fluids and nutrition through an IV tube. °· Surgery. °HOME CARE INSTRUCTIONS  °· Follow your health care provider's instructions carefully. °· Follow a full liquid diet or other diet as directed by your health care provider. After your symptoms improve, your health care provider may tell you to change your diet. He or she may recommend you eat a high-fiber diet. Fruits and vegetables are good sources of fiber. Fiber makes it easier to pass stool. °· Take fiber supplements or probiotics as directed by your health care provider. °· Only take medicines as directed by your health care provider. °· Keep all your follow-up appointments. °SEEK MEDICAL CARE IF:  °· Your pain does not improve. °· You have a hard time eating food. °· Your bowel movements do not return to normal. °SEEK IMMEDIATE MEDICAL CARE IF:  °· Your pain becomes worse. °· Your symptoms do not get better. °· Your symptoms suddenly get worse. °· You have a fever. °· You have repeated vomiting. °· You have bloody or black, tarry stools. °MAKE SURE YOU:  °· Understand these instructions. °· Will watch your condition. °· Will get help right away if you are not doing well or get worse. °  °This information is not intended to replace advice given to you by your health care provider. Make sure you discuss any questions you have with your health care provider. °  °Document Released: 05/18/2005 Document Revised: 08/13/2013 Document Reviewed: 07/03/2013 °Elsevier Interactive Patient Education ©2016 Elsevier Inc. ° °

## 2015-08-18 NOTE — Progress Notes (Signed)
By signing my name below, I, [name], attest that this documentation has been prepared under the direction and in the presence of [name], MD. Electronically Signed: [name], Scribe. 08/18/2015 , 2:34 PM .  Patient was seen in room 5 .   Patient ID: Mason [name] MRN: [id number], DOB: 07-18-1946, 69 y.o. Date of Encounter: 08/18/2015  Primary Physician: [name], MD  Chief Complaint:  Chief Complaint  Patient presents with   Abdominal Pain    X yesterday, pt worried diverticulitis may be flaring up    HPI:  Mason [name] is a 69 y.o. male who has h/o diverticulitis presents to [facility] complaining of LLQ abd pain that started yesterday. It worsened over night and into today. He noticed pain when bending over getting out of bed and brushing his teeth. He mentions having more constipation. He denies fever, diarrhea, blood in stool. He previously took cipro for relief. He states having side effects with flagyl.   He's currently retired.   Past Medical History  Diagnosis Date   Diverticulosis of colon    Depression    Anxiety    Hiatal hernia    Insomnia    GERD (gastroesophageal reflux disease)     esophageal narrowing   Internal hemorrhoids    Anal fissure    Glaucoma    IBS (irritable bowel syndrome)    Allergy    Cancer (HCC)     basal cell removed x4   Cataract     bil eyes     Home Meds: Prior to Admission medications   Medication Sig Start Date End Date Taking? Authorizing Provider  b complex vitamins capsule Take 1 capsule by mouth daily.    Historical Provider, MD  Bimatoprost (LUMIGAN) 0.01 % SOLN Apply 1 drop to eye daily.      Historical Provider, MD  cholecalciferol (VITAMIN D) 1000 UNITS tablet Take 1,000 Units by mouth daily.    Historical Provider, MD  diphenhydrAMINE (BENADRYL) 50 MG capsule Take 50 mg by mouth at bedtime as needed.    Historical Provider, MD  esomeprazole (NEXIUM)  40 MG capsule TAKE 1 CAPSULE BY MOUTH TWICE EVERY DAY 08/05/14   Joaquim Nam, MD  levocetirizine (XYZAL) 5 MG tablet TAKE ONE TABLET BY MOUTH EVERY EVENING Patient taking differently: TAKE ONE TABLET BY MOUTH EVERY EVENING AS NEEDED 12/23/14   Joaquim Nam, MD  OVER THE COUNTER MEDICATION Take 1 capsule by mouth daily. macu guard ocular support    Historical Provider, MD  sildenafil (VIAGRA) 100 MG tablet Take 0.5-1 tablets (50-100 mg total) by mouth as needed for erectile dysfunction. Take one tablet one hour before sexual activity 04/29/13   Joaquim Nam, MD  tamsulosin (FLOMAX) 0.4 MG CAPS capsule TAKE 1 CAPSULE (0.4 MG TOTAL) BY MOUTH DAILY. 12/18/14   Joaquim Nam, MD  vitamin C (ASCORBIC ACID) 500 MG tablet Take 500 mg by mouth daily.    Historical Provider, MD  vitamin E 400 UNIT capsule Take 400 Units by mouth daily.    Historical Provider, MD    Allergies:  Allergies  Allergen Reactions   Chlordiazepoxide-Clidinium     REACTION: urinary retention   Citalopram Hydrobromide     Intolerant, tried 8/201.  "It made me very angry".   Erythromycin Base     REACTION: stomach upset   Hyoscyamine Sulfate     REACTION: urinary retention   Penicillins     REACTION:  Itching, rash   Shrimp [Shellfish Allergy]     And crab- rash, GI upset, upper airway symptoms    Social History   Social History   Marital Status: Divorced    Spouse Name: N/A   Number of Children: 1   Years of Education: N/A   Emergency planning/management officer, retired Engineering geologist   Social History Main Topics   Smoking status: Former Smoker    Types: Cigarettes, Cigars    Quit date: 08/22/1998   Smokeless tobacco: Never Used     Comment: cigarettes 1968, cigars 2000   Alcohol Use: 0.0 oz/week    0 Standard drinks or equivalent per week     Comment: rare   Drug Use: No   Sexual Activity: Not on file   Other Topics Concern   Not on file   Social History Narrative     Retired from [employer]   From [place]   Prev divorced    1 son   Gar[employer] [place] '63-'67, no known agent orange exposure     Review of Systems: Constitutional: negative for fever, chills, night sweats, weight changes, or fatigue  HEENT: negative for vision changes, hearing loss, congestion, rhinorrhea, ST, epistaxis, or sinus pressure Cardiovascular: negative for chest pain or palpitations Respiratory: negative for hemoptysis, wheezing, shortness of breath, or cough Abdominal: negative for nausea, vomiting, diarrhea, or blood in stool; positive for abd pain (LLQ), constipation Dermatological: negative for rash Neurologic: negative for headache, dizziness, or syncope All other systems reviewed and are otherwise negative with the exception to those above and in the HPI.  Physical Exam: Blood pressure 148/88, pulse 74, temperature 97.7 F (36.5 C), temperature source Oral, resp. rate 16, height 5' 9.5" (1.765 m), weight 175 lb (79.379 kg), SpO2 98 %., Body mass index is 25.48 kg/(m^2). General: Well developed, well nourished, in no acute distress. Head: Normocephalic, atraumatic, eyes without discharge, sclera non-icteric, nares are without discharge. Bilateral auditory canals clear, TM's are without perforation, pearly grey and translucent with reflective cone of light bilaterally. Oral cavity moist, posterior pharynx without exudate, erythema, peritonsillar abscess, or post nasal drip.  Neck: Supple. No thyromegaly. Full ROM. No lymphadenopathy. Lungs: Clear bilaterally to auscultation without wheezes, rales, or rhonchi. Breathing is unlabored. Heart: RRR with S1 S2. No murmurs, rubs, or gallops appreciated. Abdomen: Soft. No hepatomegaly. No rebound/guarding. No obvious abdominal masses. Point tenderness and swollen over LLQ, some early rebound Msk:  Strength and tone normal for age. Extremities/Skin: Warm and dry. No clubbing or cyanosis. No edema. No rashes or suspicious lesions. Neuro: Alert and  oriented X 3. Moves all extremities spontaneously. Gait is normal. CNII-XII grossly in tact. Psych:  Responds to questions appropriately with a normal affect.    ASSESSMENT AND PLAN:  69 y.o. year old male with acute diverticulitis This chart was scribed in my presence and reviewed by me personally.    ICD-9-CM ICD-10-CM   1. Diverticulitis of colon 562.11 K57.32 ciprofloxacin (CIPRO) 500 MG tablet     Signed, [name], MD 08/18/2015 2:34 PM

## 2015-08-23 HISTORY — PX: TONGUE SURGERY: SHX810

## 2015-08-31 ENCOUNTER — Other Ambulatory Visit: Payer: Self-pay | Admitting: Family Medicine

## 2016-01-26 ENCOUNTER — Other Ambulatory Visit: Payer: Self-pay | Admitting: Family Medicine

## 2016-01-26 NOTE — Telephone Encounter (Signed)
Electronic refill request. Last Filled:    30 tablet 3 12/23/2014  Last office visit:   05/13/15  CPE   Please advise.

## 2016-01-27 NOTE — Telephone Encounter (Signed)
Sent. Thanks.   

## 2016-05-20 ENCOUNTER — Other Ambulatory Visit: Payer: Self-pay | Admitting: Family Medicine

## 2016-05-20 DIAGNOSIS — Z125 Encounter for screening for malignant neoplasm of prostate: Secondary | ICD-10-CM

## 2016-05-20 DIAGNOSIS — Z131 Encounter for screening for diabetes mellitus: Secondary | ICD-10-CM

## 2016-05-24 ENCOUNTER — Encounter: Payer: Self-pay | Admitting: Family Medicine

## 2016-05-24 ENCOUNTER — Ambulatory Visit (INDEPENDENT_AMBULATORY_CARE_PROVIDER_SITE_OTHER): Payer: Medicare Other

## 2016-05-24 ENCOUNTER — Other Ambulatory Visit: Payer: Medicare Other

## 2016-05-24 ENCOUNTER — Other Ambulatory Visit (INDEPENDENT_AMBULATORY_CARE_PROVIDER_SITE_OTHER): Payer: Medicare Other

## 2016-05-24 VITALS — BP 122/80 | HR 63 | Temp 97.9°F | Ht 69.0 in | Wt 168.8 lb

## 2016-05-24 DIAGNOSIS — E786 Lipoprotein deficiency: Secondary | ICD-10-CM

## 2016-05-24 DIAGNOSIS — Z0000 Encounter for general adult medical examination without abnormal findings: Secondary | ICD-10-CM | POA: Diagnosis not present

## 2016-05-24 DIAGNOSIS — Z131 Encounter for screening for diabetes mellitus: Secondary | ICD-10-CM

## 2016-05-24 DIAGNOSIS — Z23 Encounter for immunization: Secondary | ICD-10-CM | POA: Diagnosis not present

## 2016-05-24 DIAGNOSIS — Z125 Encounter for screening for malignant neoplasm of prostate: Secondary | ICD-10-CM | POA: Diagnosis not present

## 2016-05-24 LAB — BASIC METABOLIC PANEL
BUN: 13 mg/dL (ref 6–23)
CALCIUM: 8.8 mg/dL (ref 8.4–10.5)
CO2: 33 mEq/L — ABNORMAL HIGH (ref 19–32)
CREATININE: 1.01 mg/dL (ref 0.40–1.50)
Chloride: 101 mEq/L (ref 96–112)
GFR: 77.23 mL/min (ref >60.00)
Glucose, Bld: 94 mg/dL (ref 70–99)
Potassium: 4.9 mEq/L (ref 3.5–5.1)
Sodium: 139 mEq/L (ref 135–145)

## 2016-05-24 LAB — LIPID PANEL
CHOL/HDL RATIO: 4
Cholesterol: 154 mg/dL (ref 0–200)
HDL: 43.7 mg/dL (ref >39.00)
LDL CALC: 89 mg/dL (ref 0–99)
NonHDL: 110.09
Triglycerides: 106 mg/dL (ref 0.0–149.0)
VLDL: 21.2 mg/dL (ref 0.0–40.0)

## 2016-05-24 LAB — HEPATIC FUNCTION PANEL
ALT: 18 U/L (ref 0–53)
AST: 20 U/L (ref 0–37)
Albumin: 3.9 g/dL (ref 3.5–5.2)
Alkaline Phosphatase: 51 U/L (ref 39–117)
BILIRUBIN DIRECT: 0.1 mg/dL (ref 0.0–0.3)
BILIRUBIN TOTAL: 0.3 mg/dL (ref 0.2–1.2)
TOTAL PROTEIN: 7.2 g/dL (ref 6.0–8.3)

## 2016-05-24 LAB — PSA, MEDICARE: PSA: 4.67 ng/mL — AB (ref 0.10–4.00)

## 2016-05-24 NOTE — Patient Instructions (Signed)
Mason [name] , Thank you for taking time to come for your Medicare Wellness Visit. I appreciate your ongoing commitment to your health goals. Please review the following plan we discussed and let me know if I can assist you in the future.   These are the goals we discussed: Goals    . Increase physical activity          Starting 05/24/16, I will continue to walk at least 30 min daily.        This is a list of the screening recommended for you and due dates:  Health Maintenance  Topic Date Due  . Tetanus Vaccine  11/02/2021  . Colon Cancer Screening  08/25/2024  . Flu Shot  Completed  . Shingles Vaccine  Addressed  .  Hepatitis C: One time screening is recommended by Center for Disease Control  (CDC) for  adults born from [birth year] through 1967.   Addressed  . Pneumonia vaccines  Completed   Preventive Care for Adults  A healthy lifestyle and preventive care can promote health and wellness. Preventive health guidelines for adults include the following key practices.  . A routine yearly physical is a good way to check with your health care provider about your health and preventive screening. It is a chance to share any concerns and updates on your health and to receive a thorough exam.  . Visit your dentist for a routine exam and preventive care every 6 months. Brush your teeth twice a day and floss once a day. Good oral hygiene prevents tooth decay and gum disease.  . The frequency of eye exams is based on your age, health, family medical history, use  of contact lenses, and other factors. Follow your health care provider's ecommendations for frequency of eye exams.  . Eat a healthy diet. Foods like vegetables, fruits, whole grains, low-fat dairy products, and lean protein foods contain the nutrients you need without too many calories. Decrease your intake of foods high in solid fats, added sugars, and salt. Eat the right amount of calories for you. Get information about a proper diet from  your health care provider, if necessary.  . Regular physical exercise is one of the most important things you can do for your health. Most adults should get at least 150 minutes of moderate-intensity exercise (any activity that increases your heart rate and causes you to sweat) each week. In addition, most adults need muscle-strengthening exercises on 2 or more days a week.  Silver Sneakers may be a benefit available to you. To determine eligibility, you may visit the website: www.silversneakers.com or contact program at 608-775-0546 Mon-Fri between 8AM-8PM.   . Maintain a healthy weight. The body mass index (BMI) is a screening tool to identify possible weight problems. It provides an estimate of body fat based on height and weight. Your health care provider can find your BMI and can help you achieve or maintain a healthy weight.   For adults 20 years and older: ? A BMI below 18.5 is considered underweight. ? A BMI of 18.5 to 24.9 is normal. ? A BMI of 25 to 29.9 is considered overweight. ? A BMI of 30 and above is considered obese.   . Maintain normal blood lipids and cholesterol levels by exercising and minimizing your intake of saturated fat. Eat a balanced diet with plenty of fruit and vegetables. Blood tests for lipids and cholesterol should begin at age 73 and be repeated every 5 years. If your lipid or cholesterol  levels are high, you are over 50, or you are at high risk for heart disease, you may need your cholesterol levels checked more frequently. Ongoing high lipid and cholesterol levels should be treated with medicines if diet and exercise are not working.  . If you smoke, find out from your health care provider how to quit. If you do not use tobacco, please do not start.  . If you choose to drink alcohol, please do not consume more than 2 drinks per day. One drink is considered to be 12 ounces (355 mL) of beer, 5 ounces (148 mL) of wine, or 1.5 ounces (44 mL) of liquor.  . If you  are 50-23 years old, ask your health care provider if you should take aspirin to prevent strokes.  . Use sunscreen. Apply sunscreen liberally and repeatedly throughout the day. You should seek shade when your shadow is shorter than you. Protect yourself by wearing long sleeves, pants, a wide-brimmed hat, and sunglasses year round, whenever you are outdoors.  . Once a month, do a whole body skin exam, using a mirror to look at the skin on your back. Tell your health care provider of new moles, moles that have irregular borders, moles that are larger than a pencil eraser, or moles that have changed in shape or color.

## 2016-05-24 NOTE — Progress Notes (Signed)
Pre visit review using our clinic review tool, if applicable. No additional management support is needed unless otherwise documented below in the visit note. 

## 2016-05-24 NOTE — Addendum Note (Signed)
Addended by: Ellamae Sia on: 05/24/2016 12:08 PM   Modules accepted: Orders

## 2016-05-24 NOTE — Progress Notes (Signed)
PCP notes:   Health maintenance:  Flu vaccine - administerd  Abnormal screenings:   Hearing - failed  Patient concerns:   None  Nurse concerns:  None  Next PCP appt:   05/27/16 @ 0845  I reviewed health advisor's note, was available for consultation on the day of service listed in this note, and agree with documentation and plan. Elsie Stain, MD.

## 2016-05-24 NOTE — Progress Notes (Signed)
Subjective:   Mason [name] is a 70 y.o. male who presents for Medicare Annual/Subsequent preventive examination.  Review of Systems:  N/A Cardiac Risk Factors include: advanced age (>36men, >75 women);male gender     Objective:    Vitals: BP 122/80 (BP Location: Right Arm, Patient Position: Sitting, Cuff Size: Normal)   Pulse 63   Temp 97.9 F (36.6 C) (Oral)   Ht 5\' 9"  (1.753 m)   Wt 168 lb 12 oz (76.5 kg)   SpO2 98%   BMI 24.92 kg/m   Body mass index is 24.92 kg/m.  Tobacco History  Smoking Status  . Former Smoker  . Types: Cigarettes, Cigars  . Quit date: 08/22/1998  Smokeless Tobacco  . Never Used    Comment: cigarettes 1968, cigars 2000     Counseling given: No   Past Medical History:  Diagnosis Date  . Allergy   . Anal fissure   . Anxiety   . Cancer ([facility])    basal cell removed x4  . Cataract    bil eyes  . Depression   . Diverticulosis of colon   . GERD (gastroesophageal reflux disease)    esophageal narrowing  . Glaucoma   . Hiatal hernia   . IBS (irritable bowel syndrome)   . Insomnia   . Internal hemorrhoids    Past Surgical History:  Procedure Laterality Date  . ANAL SPHINCTEROTOMY  03/1995   fistulotomy  . APPENDECTOMY    . BASAL CELL CARCINOMA EXCISION     on back  . BICEPT TENODESIS Right 09/27/2004   decompression  . COLONOSCOPY  04/1999   internal hemmroids,12-11-08 colon tics and hems only   . ESOPHAGOGASTRODUODENOSCOPY  04/1999   with esophagitis and stricture  . MOHS SURGERY  09/02/2008   L supratip of nose with flap,basal cell cancer [facility]/MOHS  . TONSILLECTOMY AND ADENOIDECTOMY     remote  . UPPER GASTROINTESTINAL ENDOSCOPY    . WRIST SURGERY Right    13 years ago  . WRIST SURGERY     scar tissue removed   Family History  Problem Relation Age of Onset  . Hypertension Mother   . Stroke Mother   . Pneumonia Father   . Cancer Maternal Aunt     ? type  . Breast cancer Maternal Aunt     mets  . Esophageal cancer  Maternal Aunt     aunt ? esophageal ca  . Stomach cancer Maternal Aunt     aunt ? stmach ca  . Colon cancer Maternal Aunt     had colostomy - not sure of origin  . Cancer Maternal Grandmother   . Prostate cancer Neg Hx   . Rectal cancer Neg Hx    History  Sexual Activity  . Sexual activity: No    Outpatient Encounter Prescriptions as of 05/24/2016  Medication Sig  . b complex vitamins capsule Take 1 capsule by mouth daily.  . Bimatoprost (LUMIGAN) 0.01 % SOLN Apply 1 drop to eye daily.    . cholecalciferol (VITAMIN D) 1000 UNITS tablet Take 1,000 Units by mouth daily.  . diphenhydrAMINE (BENADRYL) 50 MG capsule Take 50 mg by mouth at bedtime as needed.  Marland Kitchen esomeprazole (NEXIUM) 40 MG capsule TAKE 1 CAPSULE BY MOUTH TWICE EVERY DAY  . levocetirizine (XYZAL) 5 MG tablet TAKE 1 TABLET BY MOUTH EVERY EVENING  . OVER THE COUNTER MEDICATION Take 1 capsule by mouth daily. Reported on 08/18/2015  . sildenafil (VIAGRA) 100 MG tablet Take  0.5-1 tablets (50-100 mg total) by mouth as needed for erectile dysfunction. Take one tablet one hour before sexual activity  . tamsulosin (FLOMAX) 0.4 MG CAPS capsule TAKE 1 CAPSULE (0.4 MG TOTAL) BY MOUTH DAILY.  . vitamin C (ASCORBIC ACID) 500 MG tablet Take 500 mg by mouth daily.  . vitamin E 400 UNIT capsule Take 400 Units by mouth daily.  . [DISCONTINUED] ciprofloxacin (CIPRO) 500 MG tablet Take 1 tablet (500 mg total) by mouth 2 (two) times daily.   No facility-administered encounter medications on file as of 05/24/2016.     Activities of Daily Living In your present state of health, do you have any difficulty performing the following activities: 05/24/2016  Hearing? Y  Vision? N  Difficulty concentrating or making decisions? N  Walking or climbing stairs? N  Dressing or bathing? N  Doing errands, shopping? N  Preparing Food and eating ? N  Using the Toilet? N  In the past six months, have you accidently leaked urine? N  Do you have problems  with loss of bowel control? N  Managing your Medications? N  Managing your Finances? N  Housekeeping or managing your Housekeeping? N  Some recent data might be hidden    Patient Care Team: Tonia Ghent, MD as PCP - General (Family Medicine) Ralene Bathe, MD as Consulting Physician (Dermatology) Evonnie Dawes, MD as Consulting Physician (Dentistry) Celine Ahr. Wesoloski II, OD as Set designer (Optometry)   Assessment:     Hearing Screening   125Hz  250Hz  500Hz  1000Hz  2000Hz  3000Hz  4000Hz  6000Hz  8000Hz   Right ear:   40 0 40  0    Left ear:   0 0 40  0    Vision Screening Comments: Last vision exam in July 2017   Exercise Activities and Dietary recommendations Current Exercise Habits: Home exercise routine, Type of exercise: walking, Time (Minutes): 30, Frequency (Times/Week): 7, Weekly Exercise (Minutes/Week): 210, Intensity: Moderate, Exercise limited by: None identified  Goals    . Increase physical activity          Starting 05/24/16, I will continue to walk at least 30 min daily.       Fall Risk Fall Risk  05/24/2016 05/13/2015 05/01/2014 04/29/2013  Falls in the past year? No No No No   Depression Screen PHQ 2/9 Scores 05/24/2016 08/18/2015 05/13/2015 05/01/2014  PHQ - 2 Score 0 0 0 0    Cognitive Testing MMSE - Mini Mental State Exam 05/24/2016  Orientation to time 5  Orientation to Place 5  Registration 3  Attention/ Calculation 0  Recall 3  Language- name 2 objects 0  Language- repeat 1  Language- follow 3 step command 3  Language- read & follow direction 0  Write a sentence 0  Copy design 0  Total score 20   PLEASE NOTE: A Mini-Cog screen was completed. Maximum score is 20. A value of 0 denotes this part of [place] MMSE was not completed or the patient failed this part of the Mini-Cog screening.   Mini-Cog Screening Orientation to Time - Max 5 pts Orientation to Place - Max 5 pts Registration - Max 3 pts Recall - Max 3 pts Language Repeat  - Max 1 pts Language Follow 3 Step Command - Max 3 pts   Immunization History  Administered Date(s) Administered  . Influenza Split 05/17/2011, 05/31/2012  . Influenza,inj,Quad PF,36+ Mos 04/29/2013, 05/01/2014, 05/13/2015, 05/24/2016  . Pneumococcal Conjugate-13 08/28/2014  . Pneumococcal Polysaccharide-23 10/28/2010  . Td 07/22/2002, 11/03/2011  Screening Tests Health Maintenance  Topic Date Due  . TETANUS/TDAP  11/02/2021  . COLONOSCOPY  08/25/2024  . INFLUENZA VACCINE  Completed  . ZOSTAVAX  Addressed  . Hepatitis C Screening  Addressed  . PNA vac Low Risk Adult  Completed      Plan:     I have personally reviewed and addressed the Medicare Annual Wellness questionnaire and have noted the following in the patient's chart:  A. Medical and social history B. Use of alcohol, tobacco or illicit drugs  C. Current medications and supplements D. Functional ability and status E.  Nutritional status F.  Physical activity G. Advance directives H. List of other physicians I.  Hospitalizations, surgeries, and ER visits in previous 12 months J.  [place] to include hearing, vision, cognitive, depression L. Referrals and appointments - none  In addition, I have reviewed and discussed with patient certain preventive protocols, quality metrics, and best practice recommendations. A written personalized care plan for preventive services as well as general preventive health recommendations were provided to patient.  See attached scanned questionnaire for additional information.   Signed,   Lindell Noe, MHA, BS, LPN Health Advisor

## 2016-05-27 ENCOUNTER — Ambulatory Visit (INDEPENDENT_AMBULATORY_CARE_PROVIDER_SITE_OTHER): Payer: Medicare Other | Admitting: Family Medicine

## 2016-05-27 ENCOUNTER — Encounter: Payer: Self-pay | Admitting: Family Medicine

## 2016-05-27 VITALS — BP 122/76 | HR 67 | Temp 97.6°F | Wt 170.0 lb

## 2016-05-27 DIAGNOSIS — N529 Male erectile dysfunction, unspecified: Secondary | ICD-10-CM

## 2016-05-27 DIAGNOSIS — R972 Elevated prostate specific antigen [PSA]: Secondary | ICD-10-CM | POA: Diagnosis not present

## 2016-05-27 DIAGNOSIS — Z125 Encounter for screening for malignant neoplasm of prostate: Secondary | ICD-10-CM

## 2016-05-27 DIAGNOSIS — N401 Enlarged prostate with lower urinary tract symptoms: Secondary | ICD-10-CM | POA: Diagnosis not present

## 2016-05-27 DIAGNOSIS — K219 Gastro-esophageal reflux disease without esophagitis: Secondary | ICD-10-CM

## 2016-05-27 MED ORDER — TAMSULOSIN HCL 0.4 MG PO CAPS: ORAL_CAPSULE | ORAL | 99 refills | Status: DC

## 2016-05-27 MED ORDER — ESOMEPRAZOLE MAGNESIUM 40 MG PO CPDR: DELAYED_RELEASE_CAPSULE | ORAL | 99 refills | Status: DC

## 2016-05-27 MED ORDER — SILDENAFIL CITRATE 20 MG PO TABS: 60.0000 mg | ORAL_TABLET | Freq: Every day | ORAL | 99 refills | Status: DC | PRN

## 2016-05-27 NOTE — Progress Notes (Signed)
Pre visit review using our clinic review tool, if applicable. No additional management support is needed unless otherwise documented below in the visit note. 

## 2016-05-27 NOTE — Progress Notes (Signed)
ED.  D/w pt about viagra use.  No ADE on med prev.  rx given to patient at [facility].   Hearingscreening - failed, declined hearing aids.   Health maintenance up to date.   Labs d/w pt. Lipids controlled.  LFTs wnl, sugar wnl.  Cr okay.   PSA elevation.  D/w pt.  H/o PSA elevation in the past that had decreased until this check.  No incontinence.  Still on flomax, and he can tell when it wears off with urinary flow starting/stopping.  Nocturia x3 at least at night.  He tried going up to 0.8mg  flomax but wanted to come back down to 0.4mg .  D/w pt about trial of 0.8mg  again.  He has known enlarged prostate.    D/w pt about recheck PSA here vs f/u with uro.  He wants recheck here.  D/w pt about PSA false positives.    Advance directive- d/w pt. Son designated if patient were incapacitated.   GERD controlled on PPI w/o ADE.    PMH and SH reviewed  ROS: Per HPI unless specifically indicated in ROS section   Meds, vitals, and allergies reviewed.   GEN: nad, alert and oriented HEENT: mucous membranes moist NECK: supple w/o LA CV: rrr PULM: ctab, no inc wob ABD: soft, +bs EXT: no edema SKIN: no acute rash DRE deferred.

## 2016-05-27 NOTE — Patient Instructions (Signed)
Recheck PSA in about 2 months.  Try the higher dose of flomax in the meantime.  Update me as needed.  Take care.  Glad to see you.

## 2016-05-28 NOTE — Assessment & Plan Note (Signed)
Controlled, continue as is.  

## 2016-05-28 NOTE — Assessment & Plan Note (Addendum)
H/o PSA elevation in the past that had decreased until this check.  No incontinence.  Still on flomax, and he can tell when it wears off with urinary flow starting/stopping.  Nocturia x3 at least at night.  He tried going up to 0.8mg  flomax but wanted to come back down to 0.4mg  previously.  D/w pt about trial of 0.8mg  again.  He has known enlarged prostate.   See AVS  D/w pt about recheck PSA here vs f/u with uro.  He wants recheck here.  D/w pt about PSA false positives.  Recheck in about 2-3 months.   >25 minutes spent in face to face time with patient, >50% spent in counselling or coordination of care.

## 2016-05-28 NOTE — Assessment & Plan Note (Signed)
rx done for sildenafil.

## 2016-05-28 NOTE — Assessment & Plan Note (Signed)
See above

## 2016-07-27 ENCOUNTER — Other Ambulatory Visit (INDEPENDENT_AMBULATORY_CARE_PROVIDER_SITE_OTHER): Payer: Medicare Other

## 2016-07-27 DIAGNOSIS — Z125 Encounter for screening for malignant neoplasm of prostate: Secondary | ICD-10-CM

## 2016-07-27 LAB — PSA, MEDICARE: PSA: 3.52 ng/mL (ref 0.10–4.00)

## 2017-03-05 ENCOUNTER — Other Ambulatory Visit: Payer: Self-pay | Admitting: Family Medicine

## 2017-04-10 ENCOUNTER — Telehealth: Payer: Self-pay | Admitting: Family Medicine

## 2017-04-10 NOTE — Telephone Encounter (Signed)
Left pt message asking to call Allison back directly at [phone] to schedule AWV + labs with Lesia and CPE with PCP. °  °*NOTE* Last AWV 05/24/16 °

## 2017-05-16 NOTE — Telephone Encounter (Signed)
Left pt message asking to call Ebony Hail back directly at [phone] to schedule AWV + labs with Katha Cabal and CPE with PCP.  *NOTE* Last AWV 05/24/16

## 2017-05-25 ENCOUNTER — Encounter: Payer: Self-pay | Admitting: Family Medicine

## 2017-06-04 ENCOUNTER — Other Ambulatory Visit: Payer: Self-pay | Admitting: Family Medicine

## 2017-06-04 DIAGNOSIS — Z125 Encounter for screening for malignant neoplasm of prostate: Secondary | ICD-10-CM

## 2017-06-04 DIAGNOSIS — E786 Lipoprotein deficiency: Secondary | ICD-10-CM

## 2017-06-08 ENCOUNTER — Ambulatory Visit (INDEPENDENT_AMBULATORY_CARE_PROVIDER_SITE_OTHER): Payer: Medicare Other

## 2017-06-08 ENCOUNTER — Ambulatory Visit: Payer: Medicare Other

## 2017-06-08 ENCOUNTER — Other Ambulatory Visit (INDEPENDENT_AMBULATORY_CARE_PROVIDER_SITE_OTHER): Payer: Medicare Other

## 2017-06-08 VITALS — BP 118/84 | HR 73 | Temp 98.0°F | Ht 68.75 in | Wt 172.5 lb

## 2017-06-08 DIAGNOSIS — E786 Lipoprotein deficiency: Secondary | ICD-10-CM

## 2017-06-08 DIAGNOSIS — Z0000 Encounter for general adult medical examination without abnormal findings: Secondary | ICD-10-CM | POA: Diagnosis not present

## 2017-06-08 DIAGNOSIS — Z125 Encounter for screening for malignant neoplasm of prostate: Secondary | ICD-10-CM

## 2017-06-08 LAB — COMPREHENSIVE METABOLIC PANEL
ALBUMIN: 4 g/dL (ref 3.5–5.2)
ALT: 17 U/L (ref 0–53)
AST: 19 U/L (ref 0–37)
Alkaline Phosphatase: 53 U/L (ref 39–117)
BUN: 14 mg/dL (ref 6–23)
CHLORIDE: 102 meq/L (ref 96–112)
CO2: 33 meq/L — AB (ref 19–32)
CREATININE: 1.04 mg/dL (ref 0.40–1.50)
Calcium: 9.1 mg/dL (ref 8.4–10.5)
GFR: 74.45 mL/min (ref >60.00)
GLUCOSE: 89 mg/dL (ref 70–99)
POTASSIUM: 4.3 meq/L (ref 3.5–5.1)
SODIUM: 140 meq/L (ref 135–145)
Total Bilirubin: 0.6 mg/dL (ref 0.2–1.2)
Total Protein: 6.9 g/dL (ref 6.0–8.3)

## 2017-06-08 LAB — LIPID PANEL
CHOL/HDL RATIO: 4
Cholesterol: 152 mg/dL (ref 0–200)
HDL: 41.6 mg/dL (ref >39.00)
LDL CALC: 91 mg/dL (ref 0–99)
NONHDL: 110.27
Triglycerides: 97 mg/dL (ref 0.0–149.0)
VLDL: 19.4 mg/dL (ref 0.0–40.0)

## 2017-06-08 LAB — PSA, MEDICARE: PSA: 3.37 ng/mL (ref 0.10–4.00)

## 2017-06-08 NOTE — Progress Notes (Signed)
Pre visit review using our clinic review tool, if applicable. No additional management support is needed unless otherwise documented below in the visit note. 

## 2017-06-08 NOTE — Progress Notes (Signed)
PCP notes:   Health maintenance:  No gaps identified.  Abnormal screenings:   Hearing - failed  Hearing Screening   125Hz  250Hz  500Hz  1000Hz  2000Hz  3000Hz  4000Hz  6000Hz  8000Hz   Right ear:   40 40 40  0    Left ear:   0 40 40  0     Patient concerns:   Right hip pain - pain scale: 3/10, onset: approx. 3 mths ago  Nurse concerns:  None  Next PCP appt:   06/23/17 @ 1215  I reviewed health advisor's note, was available for consultation on the day of service listed in this note, and agree with documentation and plan. Elsie Stain, MD.

## 2017-06-08 NOTE — Patient Instructions (Signed)
Mason [name] , Thank you for taking time to come for your Medicare Wellness Visit. I appreciate your ongoing commitment to your health goals. Please review the following plan we discussed and let me know if I can assist you in the future.   These are the goals we discussed: Goals    . Increase physical activity          Starting 06/08/2017, I will continue to walk at least 2 miles daily.        This is a list of the screening recommended for you and due dates:  Health Maintenance  Topic Date Due  . Tetanus Vaccine  11/02/2021  . Colon Cancer Screening  08/25/2024  . Flu Shot  Completed  .  Hepatitis C: One time screening is recommended by Center for Disease Control  (CDC) for  adults born from [birth year] through 1967.   Completed  . Pneumonia vaccines  Completed   Preventive Care for Adults  A healthy lifestyle and preventive care can promote health and wellness. Preventive health guidelines for adults include the following key practices.  . A routine yearly physical is a good way to check with your health care provider about your health and preventive screening. It is a chance to share any concerns and updates on your health and to receive a thorough exam.  . Visit your dentist for a routine exam and preventive care every 6 months. Brush your teeth twice a day and floss once a day. Good oral hygiene prevents tooth decay and gum disease.  . The frequency of eye exams is based on your age, health, family medical history, use  of contact lenses, and other factors. Follow your health care provider's recommendations for frequency of eye exams.  . Eat a healthy diet. Foods like vegetables, fruits, whole grains, low-fat dairy products, and lean protein foods contain the nutrients you need without too many calories. Decrease your intake of foods high in solid fats, added sugars, and salt. Eat the right amount of calories for you. Get information about a proper diet from your health care provider,  if necessary.  . Regular physical exercise is one of the most important things you can do for your health. Most adults should get at least 150 minutes of moderate-intensity exercise (any activity that increases your heart rate and causes you to sweat) each week. In addition, most adults need muscle-strengthening exercises on 2 or more days a week.  Silver Sneakers may be a benefit available to you. To determine eligibility, you may visit the website: www.silversneakers.com or contact program at (906)435-5653 Mon-Fri between 8AM-8PM.   . Maintain a healthy weight. The body mass index (BMI) is a screening tool to identify possible weight problems. It provides an estimate of body fat based on height and weight. Your health care provider can find your BMI and can help you achieve or maintain a healthy weight.   For adults 20 years and older: ? A BMI below 18.5 is considered underweight. ? A BMI of 18.5 to 24.9 is normal. ? A BMI of 25 to 29.9 is considered overweight. ? A BMI of 30 and above is considered obese.   . Maintain normal blood lipids and cholesterol levels by exercising and minimizing your intake of saturated fat. Eat a balanced diet with plenty of fruit and vegetables. Blood tests for lipids and cholesterol should begin at age 79 and be repeated every 5 years. If your lipid or cholesterol levels are high, you are over  50, or you are at high risk for heart disease, you may need your cholesterol levels checked more frequently. Ongoing high lipid and cholesterol levels should be treated with medicines if diet and exercise are not working.  . If you smoke, find out from your health care provider how to quit. If you do not use tobacco, please do not start.  . If you choose to drink alcohol, please do not consume more than 2 drinks per day. One drink is considered to be 12 ounces (355 mL) of beer, 5 ounces (148 mL) of wine, or 1.5 ounces (44 mL) of liquor.  . If you are 21-22 years old, ask  your health care provider if you should take aspirin to prevent strokes.  . Use sunscreen. Apply sunscreen liberally and repeatedly throughout the day. You should seek shade when your shadow is shorter than you. Protect yourself by wearing long sleeves, pants, a wide-brimmed hat, and sunglasses year round, whenever you are outdoors.  . Once a month, do a whole body skin exam, using a mirror to look at the skin on your back. Tell your health care provider of new moles, moles that have irregular borders, moles that are larger than a pencil eraser, or moles that have changed in shape or color.

## 2017-06-08 NOTE — Progress Notes (Signed)
Subjective:   Mason [name] is a 71 y.o. male who presents for Medicare Annual/Subsequent preventive examination.  Review of Systems:  N/A Cardiac Risk Factors include: advanced age (>69men, >63 women);male gender     Objective:    Vitals: BP 118/84 (BP Location: Right Arm, Patient Position: Sitting, Cuff Size: Normal)   Pulse 73   Temp 98 F (36.7 C) (Oral)   Ht 5' 8.75" (1.746 m) Comment: no shoes  Wt 172 lb 8 oz (78.2 kg)   SpO2 97%   BMI 25.66 kg/m   Body mass index is 25.66 kg/m.  Tobacco History  Smoking Status  . Former Smoker  . Types: Cigarettes, Cigars  . Quit date: 08/22/1998  Smokeless Tobacco  . Never Used    Comment: cigarettes 1968, cigars 2000     Counseling given: No   Past Medical History:  Diagnosis Date  . Allergy   . Anal fissure   . Anxiety   . Cancer ([place])    basal cell removed x4  . Cataract    bil eyes  . Depression   . Diverticulosis of colon   . GERD (gastroesophageal reflux disease)    esophageal narrowing  . Glaucoma   . Hiatal hernia   . IBS (irritable bowel syndrome)   . Insomnia   . Internal hemorrhoids    Past Surgical History:  Procedure Laterality Date  . ANAL SPHINCTEROTOMY  03/1995   fistulotomy  . APPENDECTOMY    . BASAL CELL CARCINOMA EXCISION     on back  . BICEPT TENODESIS Right 09/27/2004   decompression  . COLONOSCOPY  04/1999   internal hemmroids,12-11-08 colon tics and hems only   . ESOPHAGOGASTRODUODENOSCOPY  04/1999   with esophagitis and stricture  . MOHS SURGERY  09/02/2008   L supratip of nose with flap,basal cell cancer [facility]/MOHS  . TONSILLECTOMY AND ADENOIDECTOMY     remote  . UPPER GASTROINTESTINAL ENDOSCOPY    . WRIST SURGERY Right    13 years ago  . WRIST SURGERY     scar tissue removed   Family History  Problem Relation Age of Onset  . Hypertension Mother   . Stroke Mother   . Pneumonia Father   . Cancer Maternal Aunt        ? type  . Breast cancer Maternal Aunt        mets   . Esophageal cancer Maternal Aunt        aunt ? esophageal ca  . Stomach cancer Maternal Aunt        aunt ? stmach ca  . Colon cancer Maternal Aunt        had colostomy - not sure of origin  . Cancer Maternal Grandmother   . Prostate cancer Neg Hx   . Rectal cancer Neg Hx    History  Sexual Activity  . Sexual activity: No    Outpatient Encounter Prescriptions as of 06/08/2017  Medication Sig  . b complex vitamins capsule Take 1 capsule by mouth daily.  . Bimatoprost (LUMIGAN) 0.01 % SOLN Apply 1 drop to eye daily.    . cholecalciferol (VITAMIN D) 1000 UNITS tablet Take 1,000 Units by mouth daily.  . diphenhydrAMINE (BENADRYL) 50 MG capsule Take 50 mg by mouth at bedtime as needed.  Marland Kitchen esomeprazole (NEXIUM) 40 MG capsule TAKE 1 CAPSULE BY MOUTH TWICE EVERY DAY  . levocetirizine (XYZAL) 5 MG tablet TAKE 1 TABLET BY MOUTH EVERY EVENING  . OVER THE COUNTER MEDICATION  Take 1 capsule by mouth daily. Reported on 08/18/2015  . sildenafil (REVATIO) 20 MG tablet Take 3-5 tablets (60-100 mg total) by mouth daily as needed.  . tamsulosin (FLOMAX) 0.4 MG CAPS capsule TAKE 1-2 CAPSULES (0.4- 0.8 MG TOTAL) BY MOUTH DAILY.  . vitamin C (ASCORBIC ACID) 500 MG tablet Take 500 mg by mouth daily.  . vitamin E 400 UNIT capsule Take 400 Units by mouth daily.   No facility-administered encounter medications on file as of 06/08/2017.     Activities of Daily Living In your present state of health, do you have any difficulty performing the following activities: 06/08/2017  Hearing? Y  Comment sometimes has concerns with background noises  Vision? Y  Difficulty concentrating or making decisions? N  Walking or climbing stairs? N  Dressing or bathing? N  Doing errands, shopping? N  Preparing Food and eating ? N  Using the Toilet? N  In the past six months, have you accidently leaked urine? N  Do you have problems with loss of bowel control? N  Managing your Medications? N  Managing your Finances? N   Housekeeping or managing your Housekeeping? N  Some recent data might be hidden    Patient Care Team: Tonia Ghent, MD as PCP - General (Family Medicine) Ralene Bathe, MD as Consulting Physician (Dermatology) Pinnix-Bailey, Damaris Schooner, MD as Consulting Physician (Dentistry) Celine Ahr. Wesoloski II, OD as Set designer (Optometry)   Assessment:     Hearing Screening   125Hz  250Hz  500Hz  1000Hz  2000Hz  3000Hz  4000Hz  6000Hz  8000Hz   Right ear:   40 40 40  0    Left ear:   0 40 40  0    Vision Screening Comments: Last vision exam in Oct 2018   Exercise Activities and Dietary recommendations Current Exercise Habits: The patient does not participate in regular exercise at present, Exercise limited by: orthopedic condition(s)  Goals    . Increase physical activity          Starting 06/08/2017, I will continue to walk at least 2 miles daily.       Fall Risk Fall Risk  06/08/2017 05/24/2016 05/13/2015 05/01/2014 04/29/2013  Falls in the past year? No No No No No   Depression Screen PHQ 2/9 Scores 06/08/2017 05/24/2016 08/18/2015 05/13/2015  PHQ - 2 Score 0 0 0 0  PHQ- 9 Score 0 - - -    Cognitive Function MMSE - Mini Mental State Exam 06/08/2017 05/24/2016  Orientation to time 5 5  Orientation to Place 5 5  Registration 3 3  Attention/ Calculation 0 0  Recall 3 3  Language- name 2 objects 0 0  Language- repeat 1 1  Language- follow 3 step command 3 3  Language- read & follow direction 0 0  Write a sentence 0 0  Copy design 0 0  Total score 20 20     PLEASE NOTE: A Mini-Cog screen was completed. Maximum score is 20. A value of 0 denotes this part of Folstein MMSE was not completed or the patient failed this part of the Mini-Cog screening.   Mini-Cog Screening Orientation to Time - Max 5 pts Orientation to Place - Max 5 pts Registration - Max 3 pts Recall - Max 3 pts Language Repeat - Max 1 pts Language Follow 3 Step Command - Max 3 pts     Immunization History    Administered Date(s) Administered  . Influenza Split 05/17/2011, 05/31/2012  . Influenza,inj,Quad PF,6+ Mos 04/29/2013, 05/01/2014, 05/13/2015, 05/24/2016  .  Influenza-Unspecified 05/12/2017  . Pneumococcal Conjugate-13 08/28/2014  . Pneumococcal Polysaccharide-23 10/28/2010  . Td 07/22/2002, 11/03/2011   Screening Tests Health Maintenance  Topic Date Due  . TETANUS/TDAP  11/02/2021  . COLONOSCOPY  08/25/2024  . INFLUENZA VACCINE  Completed  . Hepatitis C Screening  Completed  . PNA vac Low Risk Adult  Completed      Plan:   I have personally reviewed, addressed, and noted the following in the patient's chart:  A. Medical and social history B. Use of alcohol, tobacco or illicit drugs  C. Current medications and supplements D. Functional ability and status E.  Nutritional status F.  Physical activity G. Advance directives H. List of other physicians I.  Hospitalizations, surgeries, and ER visits in previous 12 months J.  [place] to include hearing, vision, cognitive, depression L. Referrals and appointments - none  In addition, I have reviewed and discussed with patient certain preventive protocols, quality metrics, and best practice recommendations. A written personalized care plan for preventive services as well as general preventive health recommendations were provided to patient.  See attached scanned questionnaire for additional information.   Signed,   Lindell Noe, MHA, BS, LPN Health Coach

## 2017-06-15 ENCOUNTER — Encounter: Payer: Medicare Other | Admitting: Family Medicine

## 2017-06-19 ENCOUNTER — Other Ambulatory Visit: Payer: Self-pay | Admitting: Family Medicine

## 2017-06-23 ENCOUNTER — Other Ambulatory Visit: Payer: Self-pay | Admitting: *Deleted

## 2017-06-23 ENCOUNTER — Ambulatory Visit (INDEPENDENT_AMBULATORY_CARE_PROVIDER_SITE_OTHER): Payer: Medicare Other | Admitting: Family Medicine

## 2017-06-23 ENCOUNTER — Encounter: Payer: Self-pay | Admitting: Family Medicine

## 2017-06-23 VITALS — BP 118/84 | HR 73 | Temp 98.0°F | Ht 68.75 in | Wt 172.5 lb

## 2017-06-23 DIAGNOSIS — M7060 Trochanteric bursitis, unspecified hip: Secondary | ICD-10-CM | POA: Diagnosis not present

## 2017-06-23 DIAGNOSIS — N401 Enlarged prostate with lower urinary tract symptoms: Secondary | ICD-10-CM

## 2017-06-23 DIAGNOSIS — K219 Gastro-esophageal reflux disease without esophagitis: Secondary | ICD-10-CM | POA: Diagnosis not present

## 2017-06-23 DIAGNOSIS — Z0000 Encounter for general adult medical examination without abnormal findings: Secondary | ICD-10-CM

## 2017-06-23 MED ORDER — SILDENAFIL CITRATE 20 MG PO TABS: 60.0000 mg | ORAL_TABLET | Freq: Every day | ORAL | 12 refills | Status: DC | PRN

## 2017-06-23 MED ORDER — TAMSULOSIN HCL 0.4 MG PO CAPS: ORAL_CAPSULE | ORAL | 12 refills | Status: DC

## 2017-06-23 MED ORDER — ESOMEPRAZOLE MAGNESIUM 40 MG PO CPDR: DELAYED_RELEASE_CAPSULE | ORAL | 12 refills | Status: DC

## 2017-06-23 MED ORDER — LEVOCETIRIZINE DIHYDROCHLORIDE 5 MG PO TABS
5.0000 mg | ORAL_TABLET | Freq: Every evening | ORAL | 12 refills | Status: DC
Start: 2017-06-23 — End: 2018-06-28

## 2017-06-23 NOTE — Patient Instructions (Addendum)
Try icing your right hip, 5 minutes on and 5 minutes off.  Use a cloth between the bag and the skin.  You may end up needing to see Dr. Lorelei Pont if not better.   Check with your insurance to see if they will cover the shingrix shot. Don't change your meds.  Take care.  Glad to see you.  Update me as needed.

## 2017-06-23 NOTE — Progress Notes (Signed)
Hearing - failed.  Declined hearing aids.  D/w pt.   Tetanus 2013 PNA up to date.  Flu 2018 Shingles d/w pt.  See AVS.   Advance directive- d/w pt. Son designated if patient were incapacitated.  Prev AAA screen done (per patient) and negative.  Diet and exercise d/w pt.    PSA w/o sig change.  Still on flomax.  Worse stream if off med.  Still with nocturia.  He can put up with as is.  He wanted to limit his meds, if possible.    Right hip pain - pain scale: 3/10, onset: approx. 3 mths ago.  Near the R greater trochanteric bursa.  Some pain with crossing his leg.  Variable onset and severity.  Normal int rotation.    GERD sx controlled with 1-2 tabs per day.  Compliant.  No ADE.  It "reminds" him if he misses a dose.    PMH and SH reviewed  ROS: Per HPI unless specifically indicated in ROS section   Meds, vitals, and allergies reviewed.   GEN: nad, alert and oriented HEENT: mucous membranes moist NECK: supple w/o LA CV: rrr. PULM: ctab, no inc wob ABD: soft, +bs EXT: no edema SKIN: no acute rash Normal right hip range of motion.Midline back not tender to palpation.  Region not tender to palpation. When he does have pain it is right over the right greater trochanteric area. Able to bear weight.

## 2017-06-25 DIAGNOSIS — Z01818 Encounter for other preprocedural examination: Secondary | ICD-10-CM | POA: Insufficient documentation

## 2017-06-25 DIAGNOSIS — M7060 Trochanteric bursitis, unspecified hip: Secondary | ICD-10-CM | POA: Insufficient documentation

## 2017-06-25 DIAGNOSIS — Z0000 Encounter for general adult medical examination without abnormal findings: Secondary | ICD-10-CM | POA: Insufficient documentation

## 2017-06-25 NOTE — Assessment & Plan Note (Addendum)
Controlled with meds. Continue as is. Failed taper off of medication previously. No adverse effect on med.   D/w pt and rationale for use and risk/benefit of use.  >25 minutes spent in face to face time with patient, >50% spent in counselling or coordination of care.

## 2017-06-25 NOTE — Assessment & Plan Note (Signed)
Hearing - failed.  Declined hearing aids.  D/w pt.   Tetanus 2013 PNA up to date.  Flu 2018 Shingles d/w pt.  See AVS.   Advance directive- d/w pt. Son designated if patient were incapacitated.  Prev AAA screen done (per patient) and negative.  Diet and exercise d/w pt.

## 2017-06-25 NOTE — Assessment & Plan Note (Signed)
See after visit summary. He does not have pain on internal rotation of the hip. Likely intermittent symptoms from trochanteric bursitis. Discussed with patient about icing the area and follow up with sports medicine clinic if not better. He agrees.

## 2017-06-25 NOTE — Assessment & Plan Note (Signed)
PSA w/o sig change.  Still on flomax.  Worse stream if off med.  Still with nocturia.  He can put up with as is.  He wanted to limit his meds, if possible, so no change in meds at this point.  DRE deferred, as it will likely not change management. Discussed with patient. He agrees.

## 2017-12-22 ENCOUNTER — Encounter: Payer: Self-pay | Admitting: Family Medicine

## 2018-05-06 ENCOUNTER — Emergency Department (HOSPITAL_COMMUNITY): Payer: Medicare Other

## 2018-05-06 ENCOUNTER — Emergency Department (HOSPITAL_COMMUNITY)
Admission: EM | Admit: 2018-05-06 | Discharge: 2018-05-06 | Disposition: A | Discharge status: Home / Self Care | Payer: Medicare Other | Attending: Emergency Medicine | Admitting: Emergency Medicine

## 2018-05-06 ENCOUNTER — Encounter (HOSPITAL_COMMUNITY): Payer: Self-pay

## 2018-05-06 DIAGNOSIS — Z87891 Personal history of nicotine dependence: Secondary | ICD-10-CM | POA: Diagnosis not present

## 2018-05-06 DIAGNOSIS — Z85828 Personal history of other malignant neoplasm of skin: Secondary | ICD-10-CM | POA: Diagnosis not present

## 2018-05-06 DIAGNOSIS — Y929 Unspecified place or not applicable: Secondary | ICD-10-CM | POA: Insufficient documentation

## 2018-05-06 DIAGNOSIS — S060X1A Concussion with loss of consciousness of 30 minutes or less, initial encounter: Secondary | ICD-10-CM | POA: Diagnosis not present

## 2018-05-06 DIAGNOSIS — G44309 Post-traumatic headache, unspecified, not intractable: Secondary | ICD-10-CM | POA: Insufficient documentation

## 2018-05-06 DIAGNOSIS — Y999 Unspecified external cause status: Secondary | ICD-10-CM | POA: Diagnosis not present

## 2018-05-06 DIAGNOSIS — Z79899 Other long term (current) drug therapy: Secondary | ICD-10-CM | POA: Insufficient documentation

## 2018-05-06 DIAGNOSIS — Y939 Activity, unspecified: Secondary | ICD-10-CM | POA: Insufficient documentation

## 2018-05-06 DIAGNOSIS — S0990XA Unspecified injury of head, initial encounter: Secondary | ICD-10-CM
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
## 2018-05-06 NOTE — ED Notes (Signed)
Patient verbalizes understanding of discharge instructions. Opportunity for questioning and answers were provided. Armband removed by staff, pt discharged from ED ambulatory.   
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
## 2018-05-06 NOTE — Discharge Instructions (Addendum)
Your symptoms are most likely due to a concussion.  Imaging of your head and neck did not show any fracture, bleed, swelling. Continue Tylenol or ibuprofen as needed for pain. Try to decrease things that worsen your symptoms, including bright lights, loud noises, or lots of brain stimulation. Follow-up with your primary care doctor and/or Dr. Tamala Julian at the headache clinic for further evaluation if your headaches are not improving. Return to the emergency room if you have vision changes, numbness/weakness, slurred speech, or any new or concerning symptoms.

## 2018-05-21 ENCOUNTER — Ambulatory Visit: Payer: Self-pay | Admitting: *Deleted

## 2018-05-21 ENCOUNTER — Telehealth: Payer: Self-pay
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
## 2018-05-23 ENCOUNTER — Ambulatory Visit (INDEPENDENT_AMBULATORY_CARE_PROVIDER_SITE_OTHER): Payer: Medicare Other | Admitting: Family Medicine

## 2018-05-23 ENCOUNTER — Encounter: Payer: Self-pay | Admitting: Family Medicine

## 2018-05-23 VITALS — BP 142/88 | HR 70 | Ht 68.75 in | Wt 164.0 lb

## 2018-05-23 DIAGNOSIS — R42 Dizziness and giddiness: Secondary | ICD-10-CM | POA: Diagnosis not present

## 2018-05-23 DIAGNOSIS — R2689 Other abnormalities of gait and mobility: Secondary | ICD-10-CM | POA: Diagnosis not present

## 2018-05-23 DIAGNOSIS — S060X9A Concussion with loss of consciousness of unspecified duration, initial encounter: Secondary | ICD-10-CM | POA: Insufficient documentation

## 2018-05-23 DIAGNOSIS — S060X0A Concussion without loss of consciousness, initial encounter: Secondary | ICD-10-CM | POA: Diagnosis not present

## 2018-05-23 DIAGNOSIS — S060XAA Concussion with loss of consciousness status unknown, initial encounter: Secondary | ICD-10-CM | POA: Insufficient documentation

## 2018-05-23 NOTE — Assessment & Plan Note (Signed)
Mild concussion.  Discussed icing regimen and home exercises.  We discussed with patient on over-the-counter natural supplementation.  Patient states that he is having some difficulty with hearing and patient does have some nystagmus with left lateral gaze.  Continue to have trouble advanced imaging could be warranted.  I believe the patient is going to continue to improve though.  Because the patient is having dizziness and trouble with balance we will start vestibular neuro training and sent to physical therapy.  Follow-up with me again in 2 weeks

## 2018-05-23 NOTE — Patient Instructions (Signed)
Good to see you  For the neck  Ice 20 minutes 2 times daily. Usually after activity and before bed. pennsaid pinkie amount topically 2 times daily as needed.  For the head  Fish oil 2 grams daily  Increase vitamin D 4000 IU daily  Choline 500mg  daily  Flonase in each nostril daily for 2 weeks PT will be calling you  See me again in 3 weeks to make sure you are better

## 2018-06-13 NOTE — Progress Notes (Signed)
[name] [address], [place] [address] Phone: [phone] Subjective:   Mason [name], am serving as a scribe for Dr. Hulan Saas.   CC: Head injury follow-up  NMM:[id number]  Mason [name] is a 72 y.o. male coming in with complaint of head injury. He feels like he has improved but is still having issues. He states that he still gets foggy. Has trouble focusing with driving. Symptoms improve with rest. 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 Does complain of neck pain as well.  Seems to be mostly in the left eye.  Continues to have the hearing trouble on the left ear.     Past Medical History:  Diagnosis Date  . Allergy   . Anal fissure   . Anxiety   . Cancer ([place])    basal cell removed x4  . Cataract    bil eyes  . Depression   . Diverticulosis of colon   . GERD (gastroesophageal reflux disease)    esophageal narrowing  . Glaucoma   . Hiatal hernia   . IBS (irritable bowel syndrome)   . Insomnia   . Internal hemorrhoids    Past Surgical History:  Procedure Laterality Date  . ANAL SPHINCTEROTOMY  03/1995   fistulotomy  . APPENDECTOMY    . BASAL CELL CARCINOMA EXCISION     on back  . BICEPT TENODESIS Right 09/27/2004   decompression  . COLONOSCOPY  04/1999   internal hemmroids,12-11-08 colon tics and hems only   . ESOPHAGOGASTRODUODENOSCOPY  04/1999   with esophagitis and stricture  . MOHS SURGERY  09/02/2008   L supratip of nose with flap,basal cell cancer [facility]/MOHS  . TONSILLECTOMY AND ADENOIDECTOMY     remote  . UPPER GASTROINTESTINAL ENDOSCOPY    . WRIST SURGERY Right    13 years ago  . WRIST SURGERY     scar tissue removed   Social History   Socioeconomic History  . Marital status: Divorced    Spouse name: Not on file  . Number of children: 1  . Years of education: Not on file  . Highest education level: Not on file  Occupational History  . Occupation: Air cabin crew, retired 1997    Employer: [employer]  . Financial resource strain: Not on file  . Food insecurity:    Worry: Not on file    Inability: Not on file  . Transportation needs:    Medical: Not on file    Non-medical: Not on file  Tobacco Use  . Smoking status: Former Smoker    Types: Cigarettes, Cigars    Last attempt to quit: 08/22/1998    Years since quitting: 19.8  . Smokeless tobacco: Never Used  . Tobacco comment: cigarettes 1968, cigars 2000  Substance and Sexual Activity  . Alcohol use: Yes    Alcohol/week: 0.0 standard drinks    Comment: rare  . Drug use: [name]  . Sexual activity: Never  Lifestyle  . Physical activity:    Days per week: Not on file    Minutes per session: Not on file  . Stress: Not on file  Relationships  . Social connections:    Talks on phone: Not on file    Gets together: Not on file    Attends religious service: Not on file    Active member of club or organization: Not on file    Attends meetings of clubs or organizations: Not on file  Relationship status: Not on file  Other Topics Concern  . Not on file  Social History Narrative   Retired from [employer]   Prev divorced    1 son   [employer] '63-'67, [name] known agent orange exposure   Allergies  Allergen Reactions  . Chlordiazepoxide-Clidinium     REACTION: urinary retention  . Citalopram Hydrobromide     Intolerant, tried 8/201.  "It made me very angry".  . Erythromycin Base     REACTION: stomach upset  . Hyoscyamine Sulfate     REACTION: urinary retention  . Penicillins     REACTION: Itching, rash  . Shrimp [Shellfish Allergy]     And crab- rash, GI upset, upper airway symptoms   Family History  Problem Relation Age of Onset  . Hypertension Mother   . Stroke Mother   . Pneumonia Father   . Bladder Cancer Father   . Cancer Maternal Aunt        ? type  . Breast cancer Maternal Aunt        mets  . Esophageal cancer Maternal Aunt        aunt ? esophageal ca  . Stomach cancer Maternal Aunt         aunt ? stmach ca  . Colon cancer Maternal Aunt        had colostomy - not sure of origin  . Cancer Maternal Grandmother   . Prostate cancer Neg Hx   . Rectal cancer Neg Hx      Current Outpatient Medications (Cardiovascular):  .  sildenafil (REVATIO) 20 MG tablet, Take 3-5 tablets (60-100 mg total) by mouth daily as needed.  Current Outpatient Medications (Respiratory):  .  diphenhydrAMINE (BENADRYL) 50 MG capsule, Take 50 mg by mouth at bedtime as needed. Marland Kitchen  levocetirizine (XYZAL) 5 MG tablet, Take 1 tablet (5 mg total) by mouth every evening.    Current Outpatient Medications (Other):  .  b complex vitamins capsule, Take 1 capsule by mouth daily. .  Bimatoprost (LUMIGAN) 0.01 % SOLN, Apply 1 drop to eye daily.   .  cholecalciferol (VITAMIN D) 1000 UNITS tablet, Take 1,000 Units by mouth daily. Marland Kitchen  esomeprazole (NEXIUM) 40 MG capsule, TAKE 1 CAPSULE BY MOUTH TWICE EVERY DAY .  OVER THE COUNTER MEDICATION, Take 1 capsule by mouth daily. Reported on 08/18/2015 .  tamsulosin (FLOMAX) 0.4 MG CAPS capsule, TAKE 1-2 CAPSULES (0.4- 0.8 MG TOTAL) BY MOUTH DAILY. .  vitamin C (ASCORBIC ACID) 500 MG tablet, Take 500 mg by mouth daily. .  vitamin E 400 UNIT capsule, Take 400 Units by mouth daily.    Past medical history, social, surgical and family history all reviewed in electronic medical record.  [name] pertanent information unless stated regarding to the chief complaint.   Review of Systems:  [name] headache, visual changes, nausea, vomiting, diarrhea, constipation, dizziness, abdominal pain, skin rash, fevers, chills, night sweats, weight loss, swollen lymph nodes, body aches, joint swelling,  chest pain, shortness of breath, mood changes.  Positive muscle aches  Objective  Blood pressure 140/88, pulse (!) 56, height 5\' 8"  (1.727 m), weight 166 lb (75.3 kg), SpO2 98 %.    General: [name] apparent distress alert and oriented x3 mood and affect normal, dressed appropriately.  HEENT:  Pupils of the left eye seems to be fixed compared to the contralateral side and not responding to light is appropriately.  Patient has more of a lag especially with right lateral gaze with  the left eye. Respiratory: Patient's speak in full sentences and does not appear short of breath  Cardiovascular: [name] lower extremity edema, non tender, [name] erythema  Skin: Warm dry intact with [name] signs of infection or rash on extremities or on axial skeleton.  Abdomen: Soft nontender  Neuro: Cranial nerves II through XII are intact, neurovascularly intact in all extremities with 2+ DTRs and 2+ pulses.  Lymph: [name] lymphadenopathy of posterior or anterior cervical chain or axillae bilaterally.  Gait normal with good balance and coordination.  MSK:  Non tender with full range of motion and good stability and symmetric strength and tone of shoulders, elbows, wrist, hip, knee and ankles bilaterally.  Mild arthritic changes of multiple joints Neck: Inspection mild loss of lordosis. [name] palpable stepoffs. Negative Spurling's maneuver. Full neck range of motion Grip strength and sensation normal in bilateral hands Strength good C4 to T1 distribution [name] sensory change to C4 to T1 Negative Hoffman sign bilaterally Reflexes normal    Impression and Recommendations:     This case required medical decision making of moderate complexity. The above documentation has been reviewed and is accurate and complete Lyndal Pulley, DO       Note: This dictation was prepared with Dragon dictation along with smaller phrase technology. Any transcriptional errors that result from this process are unintentional.

## 2018-06-14 ENCOUNTER — Ambulatory Visit (INDEPENDENT_AMBULATORY_CARE_PROVIDER_SITE_OTHER): Payer: Medicare Other | Admitting: Family Medicine

## 2018-06-14 DIAGNOSIS — G44329 Chronic post-traumatic headache, not intractable: Secondary | ICD-10-CM

## 2018-06-14 DIAGNOSIS — H905 Unspecified sensorineural hearing loss: Secondary | ICD-10-CM

## 2018-06-14 DIAGNOSIS — S060X0D Concussion without loss of consciousness, subsequent encounter: Secondary | ICD-10-CM

## 2018-06-14 NOTE — Assessment & Plan Note (Signed)
Patient continues to have symptoms.  Patient's also left eye pupil seems to be more fixed.  Patient does have a history of some mild glaucoma as well as cataracts.  Discussed with patient at this time with the hearing problems as well as the intermittent fussing that can even happen at rest I would like an MRI and MR angiogram of the head and neck.  Think that this would be beneficial.  Patient was already having some complications before the head injury and things seems to be significantly exacerbated at the time.  Patient does have headache seems to get worse, intractable vomiting or any more visual disturbances that seem to last longer he needs to seek medical attention immediately.  Patient is in agreement with the plan.  Patient will follow-up with me again after the imaging and will discuss further.  Spent  25 minutes with patient face-to-face and had greater than 50% of counseling including as described above in assessment and plan.

## 2018-06-14 NOTE — Patient Instructions (Signed)
Good to see you  We will get MRI of your head and neck to look at the blood supply and the brain  Continue the vitamins PT I think is still a good idea  See me again in 3 weeks

## 2018-06-19 ENCOUNTER — Telehealth: Payer: Self-pay | Admitting: *Deleted

## 2018-06-19 DIAGNOSIS — H905 Unspecified sensorineural hearing loss: Secondary | ICD-10-CM

## 2018-06-19 NOTE — Telephone Encounter (Signed)
Copied from [place]. Topic: General - Other >> Jun 15, 2018 11:56 AM Antonieta Iba C wrote: Reason for CRM: Mardene Celeste w/ [facility] called in in regards to the imaging order that was placed. She said that it should read MRA neck with & without contrast. CPT code [phone], (she think) she would like to have order updated. Order was placed by Dr. Tamala Julian     Call back: [phone]

## 2018-06-26 ENCOUNTER — Ambulatory Visit (INDEPENDENT_AMBULATORY_CARE_PROVIDER_SITE_OTHER): Payer: Medicare Other

## 2018-06-26 ENCOUNTER — Other Ambulatory Visit: Payer: Self-pay | Admitting: Family Medicine

## 2018-06-26 VITALS — BP 132/84 | HR 70 | Temp 98.0°F | Ht 70.0 in | Wt 168.5 lb

## 2018-06-26 DIAGNOSIS — Z125 Encounter for screening for malignant neoplasm of prostate: Secondary | ICD-10-CM

## 2018-06-26 DIAGNOSIS — Z0000 Encounter for general adult medical examination without abnormal findings: Secondary | ICD-10-CM

## 2018-06-26 DIAGNOSIS — E786 Lipoprotein deficiency: Secondary | ICD-10-CM

## 2018-06-26 LAB — LIPID PANEL
CHOLESTEROL: 152 mg/dL (ref 0–200)
HDL: 41.8 mg/dL (ref >39.00)
LDL Cholesterol: 92 mg/dL (ref 0–99)
NONHDL: 110.31
Total CHOL/HDL Ratio: 4
Triglycerides: 92 mg/dL (ref 0.0–149.0)
VLDL: 18.4 mg/dL (ref 0.0–40.0)

## 2018-06-26 LAB — COMPREHENSIVE METABOLIC PANEL
ALBUMIN: 4.2 g/dL (ref 3.5–5.2)
ALK PHOS: 54 U/L (ref 39–117)
ALT: 19 U/L (ref 0–53)
AST: 18 U/L (ref 0–37)
BUN: 15 mg/dL (ref 6–23)
CO2: 32 mEq/L (ref 19–32)
Calcium: 9.4 mg/dL (ref 8.4–10.5)
Chloride: 101 mEq/L (ref 96–112)
Creatinine, Ser: 1.1 mg/dL (ref 0.40–1.50)
GFR: 69.58 mL/min (ref >60.00)
Glucose, Bld: 95 mg/dL (ref 70–99)
POTASSIUM: 5.1 meq/L (ref 3.5–5.1)
Sodium: 139 mEq/L (ref 135–145)
TOTAL PROTEIN: 6.9 g/dL (ref 6.0–8.3)
Total Bilirubin: 0.5 mg/dL (ref 0.2–1.2)

## 2018-06-26 LAB — PSA, MEDICARE: PSA: 3.29 ng/mL (ref 0.10–4.00)

## 2018-06-26 NOTE — Patient Instructions (Addendum)
Mason [name] , Thank you for taking time to come for your Medicare Wellness Visit. I appreciate your ongoing commitment to your health goals. Please review the following plan we discussed and let me know if I can assist you in the future.   These are the goals we discussed: Goals    . Patient Stated     Starting 06/26/2018, I will continue to take medications as prescribed.        This is a list of the screening recommended for you and due dates:  Health Maintenance  Topic Date Due  . Tetanus Vaccine  11/02/2021  . Colon Cancer Screening  08/25/2024  . Flu Shot  Completed  .  Hepatitis C: One time screening is recommended by Center for Disease Control  (CDC) for  adults born from [birth year] through 1967.   Completed  . Pneumonia vaccines  Completed   Preventive Care for Adults  A healthy lifestyle and preventive care can promote health and wellness. Preventive health guidelines for adults include the following key practices.  . A routine yearly physical is a good way to check with your health care provider about your health and preventive screening. It is a chance to share any concerns and updates on your health and to receive a thorough exam.  . Visit your dentist for a routine exam and preventive care every 6 months. Brush your teeth twice a day and floss once a day. Good oral hygiene prevents tooth decay and gum disease.  . The frequency of eye exams is based on your age, health, family medical history, use  of contact lenses, and other factors. Follow your health care provider's recommendations for frequency of eye exams.  . Eat a healthy diet. Foods like vegetables, fruits, whole grains, low-fat dairy products, and lean protein foods contain the nutrients you need without too many calories. Decrease your intake of foods high in solid fats, added sugars, and salt. Eat the right amount of calories for you. Get information about a proper diet from your health care provider, if  necessary.  . Regular physical exercise is one of the most important things you can do for your health. Most adults should get at least 150 minutes of moderate-intensity exercise (any activity that increases your heart rate and causes you to sweat) each week. In addition, most adults need muscle-strengthening exercises on 2 or more days a week.  Silver Sneakers may be a benefit available to you. To determine eligibility, you may visit the website: www.silversneakers.com or contact program at (678) 825-0244 Mon-Fri between 8AM-8PM.   . Maintain a healthy weight. The body mass index (BMI) is a screening tool to identify possible weight problems. It provides an estimate of body fat based on height and weight. Your health care provider can find your BMI and can help you achieve or maintain a healthy weight.   For adults 20 years and older: ? A BMI below 18.5 is considered underweight. ? A BMI of 18.5 to 24.9 is normal. ? A BMI of 25 to 29.9 is considered overweight. ? A BMI of 30 and above is considered obese.   . Maintain normal blood lipids and cholesterol levels by exercising and minimizing your intake of saturated fat. Eat a balanced diet with plenty of fruit and vegetables. Blood tests for lipids and cholesterol should begin at age 74 and be repeated every 5 years. If your lipid or cholesterol levels are high, you are over 50, or you are at high risk for  heart disease, you may need your cholesterol levels checked more frequently. Ongoing high lipid and cholesterol levels should be treated with medicines if diet and exercise are not working.  . If you smoke, find out from your health care provider how to quit. If you do not use tobacco, please do not start.  . If you choose to drink alcohol, please do not consume more than 2 drinks per day. One drink is considered to be 12 ounces (355 mL) of beer, 5 ounces (148 mL) of wine, or 1.5 ounces (44 mL) of liquor.  . If you are 47-12 years old, ask your  health care provider if you should take aspirin to prevent strokes.  . Use sunscreen. Apply sunscreen liberally and repeatedly throughout the day. You should seek shade when your shadow is shorter than you. Protect yourself by wearing long sleeves, pants, a wide-brimmed hat, and sunglasses year round, whenever you are outdoors.  . Once a month, do a whole body skin exam, using a mirror to look at the skin on your back. Tell your health care provider of new moles, moles that have irregular borders, moles that are larger than a pencil eraser, or moles that have changed in shape or color.

## 2018-06-28 ENCOUNTER — Encounter: Payer: Self-pay | Admitting: Family Medicine

## 2018-06-28 ENCOUNTER — Ambulatory Visit (INDEPENDENT_AMBULATORY_CARE_PROVIDER_SITE_OTHER): Payer: Medicare Other | Admitting: Family Medicine

## 2018-06-28 VITALS — BP 132/84 | HR 70 | Temp 98.0°F | Ht 70.0 in | Wt 158.5 lb

## 2018-06-28 DIAGNOSIS — K148 Other diseases of tongue: Secondary | ICD-10-CM

## 2018-06-28 DIAGNOSIS — N401 Enlarged prostate with lower urinary tract symptoms: Secondary | ICD-10-CM

## 2018-06-28 DIAGNOSIS — H6980 Other specified disorders of Eustachian tube, unspecified ear: Secondary | ICD-10-CM

## 2018-06-28 DIAGNOSIS — S060X0D Concussion without loss of consciousness, subsequent encounter: Secondary | ICD-10-CM | POA: Diagnosis not present

## 2018-06-28 DIAGNOSIS — K219 Gastro-esophageal reflux disease without esophagitis: Secondary | ICD-10-CM | POA: Diagnosis not present

## 2018-06-28 DIAGNOSIS — Z0000 Encounter for general adult medical examination without abnormal findings: Secondary | ICD-10-CM

## 2018-06-28 DIAGNOSIS — E786 Lipoprotein deficiency: Secondary | ICD-10-CM

## 2018-06-28 DIAGNOSIS — Z7189 Other specified counseling: Secondary | ICD-10-CM

## 2018-06-28 MED ORDER — SILDENAFIL CITRATE 20 MG PO TABS: 60.0000 mg | ORAL_TABLET | Freq: Every day | ORAL | 12 refills | Status: DC | PRN

## 2018-06-28 MED ORDER — ESOMEPRAZOLE MAGNESIUM 40 MG PO CPDR: DELAYED_RELEASE_CAPSULE | ORAL | 12 refills | Status: DC

## 2018-06-28 MED ORDER — FLUTICASONE PROPIONATE 50 MCG/ACT NA SUSP: 2.0000 | Freq: Every day | NASAL | Status: DC

## 2018-06-28 MED ORDER — TAMSULOSIN HCL 0.4 MG PO CAPS: ORAL_CAPSULE | ORAL | 12 refills | Status: DC

## 2018-06-28 MED ORDER — LEVOCETIRIZINE DIHYDROCHLORIDE 5 MG PO TABS: 5.0000 mg | ORAL_TABLET | Freq: Every evening | ORAL | 12 refills | Status: DC

## 2018-06-28 NOTE — Patient Instructions (Signed)
Call [facility] about follow up.    Use flonase and gently try to pop your ears.  See if that helps with the left ear troubles.   I'll await the MRI and notes from [place].   Take care.  Glad to see you.

## 2018-06-28 NOTE — Progress Notes (Signed)
Subjective:   Mason [name] is a 72 y.o. male who presents for Medicare Annual/Subsequent preventive examination.  Review of Systems:  N/A Cardiac Risk Factors include: advanced age (>27men, >26 women);male gender     Objective:    Vitals: BP 132/84 (BP Location: Right Arm, Patient Position: Sitting, Cuff Size: Normal)   Pulse 70   Temp 98 F (36.7 C) (Oral)   Ht 5\' 10"  (1.778 m) Comment: shoes  Wt 168 lb 8 oz (76.4 kg)   SpO2 99%   BMI 24.18 kg/m   Body mass index is 24.18 kg/m.  Advanced Directives 06/26/2018 06/08/2017 05/24/2016 08/18/2014  Does Patient Have a Medical Advance Directive? No No No No  Would patient like information on creating a medical advance directive? (No Data) Yes (MAU/Ambulatory/Procedural Areas - Information given) No - patient declined information -    Tobacco Social History   Tobacco Use  Smoking Status Former Smoker  . Types: Cigarettes, Cigars  . Last attempt to quit: 08/22/1998  . Years since quitting: 19.8  Smokeless Tobacco Never Used  Tobacco Comment   cigarettes 1968, cigars 2000     Counseling given: No Comment: cigarettes 1968, cigars 2000   Clinical Intake:  Pre-visit preparation completed: Yes  Pain : No/denies pain Pain Score: 0-No pain     Nutritional Status: BMI 25 -29 Overweight Nutritional Risks: None Diabetes: No  How often do you need to have someone help you when you read instructions, pamphlets, or other written materials from your doctor or pharmacy?: 1 - Never What is the last grade level you completed in school?: 12th grade + some college  Interpreter Needed?: No  Comments: pt lives alone Information entered by :: LPinson, LPN  Past Medical History:  Diagnosis Date  . Allergy   . Anal fissure   . Anxiety   . Cancer ([place])    basal cell removed x4  . Cataract    bil eyes  . Depression   . Diverticulosis of colon   . GERD (gastroesophageal reflux disease)    esophageal narrowing  . Glaucoma    . Hiatal hernia   . IBS (irritable bowel syndrome)   . Insomnia   . Internal hemorrhoids    Past Surgical History:  Procedure Laterality Date  . ANAL SPHINCTEROTOMY  03/1995   fistulotomy  . APPENDECTOMY    . BASAL CELL CARCINOMA EXCISION     on back  . BICEPT TENODESIS Right 09/27/2004   decompression  . COLONOSCOPY  04/1999   internal hemmroids,12-11-08 colon tics and hems only   . ESOPHAGOGASTRODUODENOSCOPY  04/1999   with esophagitis and stricture  . MOHS SURGERY  09/02/2008   L supratip of nose with flap,basal cell cancer [facility]/MOHS  . TONSILLECTOMY AND ADENOIDECTOMY     remote  . UPPER GASTROINTESTINAL ENDOSCOPY    . WRIST SURGERY Right    13 years ago  . WRIST SURGERY     scar tissue removed   Family History  Problem Relation Age of Onset  . Hypertension Mother   . Stroke Mother   . Pneumonia Father   . Bladder Cancer Father   . Cancer Maternal Aunt        ? type  . Breast cancer Maternal Aunt        mets  . Esophageal cancer Maternal Aunt        aunt ? esophageal ca  . Stomach cancer Maternal Aunt        aunt ? stmach  ca  . Colon cancer Maternal Aunt        had colostomy - not sure of origin  . Cancer Maternal Grandmother   . Prostate cancer Neg Hx   . Rectal cancer Neg Hx    Social History   Socioeconomic History  . Marital status: Divorced    Spouse name: Not on file  . Number of children: 1  . Years of education: Not on file  . Highest education level: Not on file  Occupational History  . Occupation: Air cabin crew, retired 1997    Employer: [employer]  . Financial resource strain: Not on file  . Food insecurity:    Worry: Not on file    Inability: Not on file  . Transportation needs:    Medical: Not on file    Non-medical: Not on file  Tobacco Use  . Smoking status: Former Smoker    Types: Cigarettes, Cigars    Last attempt to quit: 08/22/1998    Years since quitting: 19.8  . Smokeless tobacco: Never Used  .  Tobacco comment: cigarettes 1968, cigars 2000  Substance and Sexual Activity  . Alcohol use: Yes    Alcohol/week: 0.0 standard drinks    Comment: rare  . Drug use: No  . Sexual activity: Never  Lifestyle  . Physical activity:    Days per week: Not on file    Minutes per session: Not on file  . Stress: Not on file  Relationships  . Social connections:    Talks on phone: Not on file    Gets together: Not on file    Attends religious service: Not on file    Active member of club or organization: Not on file    Attends meetings of clubs or organizations: Not on file    Relationship status: Not on file  Other Topics Concern  . Not on file  Social History Narrative   Retired from [employer] divorced    1 son   [employer] '63-'67, no known agent orange exposure    Outpatient Encounter Medications as of 06/26/2018  Medication Sig  . b complex vitamins capsule Take 1 capsule by mouth daily.  . Bimatoprost (LUMIGAN) 0.01 % SOLN Apply 1 drop to eye daily.    . cholecalciferol (VITAMIN D) 1000 UNITS tablet Take 1,000 Units by mouth daily.  . diphenhydrAMINE (BENADRYL) 50 MG capsule Take 50 mg by mouth at bedtime as needed.  Marland Kitchen esomeprazole (NEXIUM) 40 MG capsule TAKE 1 CAPSULE BY MOUTH TWICE EVERY DAY  . levocetirizine (XYZAL) 5 MG tablet Take 1 tablet (5 mg total) by mouth every evening.  Marland Kitchen OVER THE COUNTER MEDICATION Take 1 capsule by mouth daily. Reported on 08/18/2015  . sildenafil (REVATIO) 20 MG tablet Take 3-5 tablets (60-100 mg total) by mouth daily as needed.  . tamsulosin (FLOMAX) 0.4 MG CAPS capsule TAKE 1-2 CAPSULES (0.4- 0.8 MG TOTAL) BY MOUTH DAILY.  . vitamin C (ASCORBIC ACID) 500 MG tablet Take 500 mg by mouth daily.  . vitamin E 400 UNIT capsule Take 400 Units by mouth daily.  . [DISCONTINUED] traZODone (DESYREL) 50 MG tablet 0.5-1 tab at night for insomnia.   No facility-administered encounter medications on file as of 06/26/2018.     Activities of Daily  Living In your present state of health, do you have any difficulty performing the following activities: 06/26/2018  Hearing? Y  Comment loud noises echo in ears  Vision?  Y  Comment concerns with focusing due to concussion  Difficulty concentrating or making decisions? N  Walking or climbing stairs? N  Dressing or bathing? N  Doing errands, shopping? N  Preparing Food and eating ? N  Using the Toilet? N  In the past six months, have you accidently leaked urine? N  Do you have problems with loss of bowel control? N  Managing your Medications? N  Managing your Finances? N  Housekeeping or managing your Housekeeping? N  Some recent data might be hidden    Patient Care Team: Tonia Ghent, MD as PCP - General (Family Medicine) Ralene Bathe, MD as Consulting Physician (Dermatology) Pinnix-Bailey, Damaris Schooner, MD as Consulting Physician (Dentistry) Celine Ahr. Wesoloski II, OD as Geophysical data processor)   Assessment:   This is a routine wellness examination for Toran.   Hearing Screening   125Hz  250Hz  500Hz  1000Hz  2000Hz  3000Hz  4000Hz  6000Hz  8000Hz   Right ear:   40 40 40  0    Left ear:   40 40 40  0    Vision Screening Comments: Vision exam in 2019 with Dr. Ronne Binning   Exercise Activities and Dietary recommendations Current Exercise Habits: The patient does not participate in regular exercise at present, Exercise limited by: None identified  Goals    . Patient Stated     Starting 06/26/2018, I will continue to take medications as prescribed.        Fall Risk Fall Risk  06/26/2018 06/08/2017 05/24/2016 05/13/2015 05/01/2014  Falls in the past year? 0 No No No No   Depression Screen PHQ 2/9 Scores 06/26/2018 06/08/2017 05/24/2016 08/18/2015  PHQ - 2 Score 0 0 0 0  PHQ- 9 Score 0 0 - -    Cognitive Function MMSE - Mini Mental State Exam 06/26/2018 06/08/2017 05/24/2016  Orientation to time 5 5 5   Orientation to Place 5 5 5   Registration 3 3 3   Attention/ Calculation  0 0 0  Recall 3 3 3   Language- name 2 objects 0 0 0  Language- repeat 1 1 1   Language- follow 3 step command 3 3 3   Language- read & follow direction 0 0 0  Write a sentence 0 0 0  Copy design 0 0 0  Total score 20 20 20      PLEASE NOTE: A Mini-Cog screen was completed. Maximum score is 20. A value of 0 denotes this part of Folstein MMSE was not completed or the patient failed this part of the Mini-Cog screening.   Mini-Cog Screening Orientation to Time - Max 5 pts Orientation to Place - Max 5 pts Registration - Max 3 pts Recall - Max 3 pts Language Repeat - Max 1 pts Language Follow 3 Step Command - Max 3 pts     Immunization History  Administered Date(s) Administered  . Influenza Split 05/17/2011, 05/31/2012  . Influenza, High Dose Seasonal PF 05/23/2018  . Influenza,inj,Quad PF,6+ Mos 04/29/2013, 05/01/2014, 05/13/2015, 05/24/2016  . Influenza-Unspecified 05/12/2017  . Pneumococcal Conjugate-13 08/28/2014  . Pneumococcal Polysaccharide-23 10/28/2010  . Td 07/22/2002, 11/03/2011    Screening Tests Health Maintenance  Topic Date Due  . TETANUS/TDAP  11/02/2021  . COLONOSCOPY  08/25/2024  . INFLUENZA VACCINE  Completed  . Hepatitis C Screening  Completed  . PNA vac Low Risk Adult  Completed     Plan:     I have personally reviewed, addressed, and noted the following in the patient's chart:  A. Medical and social history B. Use  of alcohol, tobacco or illicit drugs  C. Current medications and supplements D. Functional ability and status E.  Nutritional status F.  Physical activity G. Advance directives H. List of other physicians I.  Hospitalizations, surgeries, and ER visits in previous 12 months J.  [place] to include hearing, vision, cognitive, depression L. Referrals and appointments - none  In addition, I have reviewed and discussed with patient certain preventive protocols, quality metrics, and best practice recommendations. A written  personalized care plan for preventive services as well as general preventive health recommendations were provided to patient.  See attached scanned questionnaire for additional information.   Signed,   Lindell Noe, MHA, BS, LPN Health Coach

## 2018-06-28 NOTE — Progress Notes (Signed)
R thumb stitches removed.   He has normal ROM and sensation.   Hearing changes in the L ear.  L ear has an echo.  Better with yawning.  Loss of high pitches on hearing test recently, d/w pt.      BPH. Still on flomax.  No ADE on med.  Compliant.  Stream is reasonable with occ starting and stopping.    GERD.  On PPI. No ADE on med.  Compliant.   Tetanus 2013 PNA up to date.  Flu 2019 Shingles d/w pt.  Out of stock.   Colonoscopy 2016 Advance directive- d/w pt. Son designated if patient were incapacitated.  Prev AAA screen done (per patient) and negative.  PSA wnl 2019.   He has been seeing Dr. Tamala Julian and has f/u MRI pending. He isn't getting lost but he has confusion episodically.  He still recalls all of his med doses, etc.  Normal conversation here at clinic.  Reasonable to limit exercise in the meantime given recent events.   He'll call [facility] about tongue lesion.  D/w pt.  He didn't have cancerous changes noted on the path report but he did not have full excision either.  He will check with them about follow-up excision.  Lipids are reasonable.  Labs discussed with patient.  Continue work on diet.  PMH and SH reviewed  ROS: Per HPI unless specifically indicated in ROS section   Meds, vitals, and allergies reviewed.  GEN: nad, alert and oriented HEENT: mucous membranes moist NECK: supple w/o LA CV: rrr PULM: ctab, no inc wob ABD: soft, +bs EXT: no edema SKIN: no acute rash R ETD on valsalva.  TM without erythema bilaterally R side of tongue with small patch of whitish tissue noted.

## 2018-06-28 NOTE — Progress Notes (Signed)
PCP notes:   Health maintenance:  No gaps identified.  Abnormal screenings:   Hearing - failed  Hearing Screening   125Hz  250Hz  500Hz  1000Hz  2000Hz  3000Hz  4000Hz  6000Hz  8000Hz   Right ear:   40 40 40  0    Left ear:   40 40 40  0     Patient concerns:   None  Nurse concerns:  None  Next PCP appt:   06/28/18 @ 1045  I reviewed health advisor's note, was available for consultation on the day of service listed in this note, and agree with documentation and plan. Elsie Stain, MD.

## 2018-06-29 ENCOUNTER — Ambulatory Visit
Admission: RE | Admit: 2018-06-29 | Discharge: 2018-06-29 | Disposition: A | Discharge status: Home / Self Care | Payer: Medicare Other | Source: Ambulatory Visit | Attending: Family Medicine | Admitting: Family Medicine

## 2018-06-29 DIAGNOSIS — H905 Unspecified sensorineural hearing loss: Secondary | ICD-10-CM

## 2018-06-29 DIAGNOSIS — G44329 Chronic post-traumatic headache, not intractable: Secondary | ICD-10-CM

## 2018-06-29 MED ORDER — GADOBENATE DIMEGLUMINE 529 MG/ML IV SOLN
15.0000 mL | Freq: Once | INTRAVENOUS | Status: AC | PRN
  Administered 2018-06-29: 15 mL via INTRAVENOUS

## 2018-07-01 DIAGNOSIS — K148 Other diseases of tongue: Secondary | ICD-10-CM | POA: Insufficient documentation

## 2018-07-01 DIAGNOSIS — H6980 Other specified disorders of Eustachian tube, unspecified ear: Secondary | ICD-10-CM | POA: Insufficient documentation

## 2018-07-01 NOTE — Assessment & Plan Note (Signed)
Still on flomax.  No ADE on med.  Compliant.  Stream is reasonable with occ starting and stopping.

## 2018-07-01 NOTE — Assessment & Plan Note (Signed)
Advance directive- d/w pt. Son designated if patient were incapacitated.  ?

## 2018-07-01 NOTE — Assessment & Plan Note (Signed)
Discussed with patient about anatomy. Use flonase and gently try to perform Valsalva.  He will update me as needed.  Rationale discussed.

## 2018-07-01 NOTE — Assessment & Plan Note (Signed)
Lipids are reasonable.  Labs discussed with patient.  Continue work on diet. >25 minutes spent in face to face time with patient, >50% spent in counselling or coordination of care, discussing lipids, concussion, tongue lesion, etc.

## 2018-07-01 NOTE — Assessment & Plan Note (Signed)
He'll call [facility] about tongue lesion.  D/w pt.  He didn't have cancerous changes noted on the path report but he did not have full excision either.  He will check with them about follow-up excision.

## 2018-07-01 NOTE — Assessment & Plan Note (Signed)
He has follow-up MRI pending.  I will await consult notes and imaging report.  He agrees.  Reasonable to limit exercise in the meantime.  He agrees with plan.

## 2018-07-01 NOTE — Assessment & Plan Note (Signed)
On PPI. No ADE on med.  Compliant.  Continue for now.

## 2018-07-01 NOTE — Assessment & Plan Note (Signed)
Tetanus 2013 PNA up to date.  Flu 2019 Shingles d/w pt.  Out of stock.   Colonoscopy 2016 Advance directive- d/w pt. Son designated if patient were incapacitated.  Prev AAA screen done (per patient) and negative.  PSA wnl 2019.

## 2018-07-11 ENCOUNTER — Ambulatory Visit (INDEPENDENT_AMBULATORY_CARE_PROVIDER_SITE_OTHER): Payer: Medicare Other | Admitting: Family Medicine

## 2018-07-11 ENCOUNTER — Encounter: Payer: Self-pay | Admitting: Family Medicine

## 2018-07-11 DIAGNOSIS — S060X0D Concussion without loss of consciousness, subsequent encounter: Secondary | ICD-10-CM

## 2018-07-11 NOTE — Progress Notes (Signed)
[name] [address] [place], [place] [address] Phone: [phone] Subjective:    I [name] am serving as a Education administrator for Dr. Hulan Saas.     CC: Head injury follow-up  [id number]  [name] is a 72 y.o. male coming in with complaint of concussion symptoms. Short drives he is ok. Long drives his vision was effected. Had trouble seeing speed limit signs. Still a little fuzzy. Has bacteria on the right eye. Waiting on eye drops. Getting eye checked again in a few weeks.   Patient did have an MR angiogram done of the brain and neck.  No significant claudication noted just some mild brain atrophy noted but is fairly reasonable for patient's age.  Patient states that the fogginess has improved and more of the eyestrain seems to be the more difficult part that seems to come and go.  States that the hearing is significantly better.  Still if he moves too quickly still can have some mild feeling of dizziness but overall nothing severe as what it was     Past Medical History:  Diagnosis Date  . Allergy   . Anal fissure   . Anxiety   . Cancer ([place])    basal cell removed x4  . Cataract    bil eyes  . Depression   . Diverticulosis of colon   . GERD (gastroesophageal reflux disease)    esophageal narrowing  . Glaucoma   . Hiatal hernia   . IBS (irritable bowel syndrome)   . Insomnia   . Internal hemorrhoids    Past Surgical History:  Procedure Laterality Date  . ANAL SPHINCTEROTOMY  03/1995   fistulotomy  . APPENDECTOMY    . BASAL CELL CARCINOMA EXCISION     on back  . BICEPT TENODESIS Right 09/27/2004   decompression  . COLONOSCOPY  04/1999   internal hemmroids,12-11-08 colon tics and hems only   . ESOPHAGOGASTRODUODENOSCOPY  04/1999   with esophagitis and stricture  . MOHS SURGERY  09/02/2008   L supratip of nose with flap,basal cell cancer [facility]/MOHS  . TONSILLECTOMY AND ADENOIDECTOMY     remote  . UPPER GASTROINTESTINAL  ENDOSCOPY    . WRIST SURGERY Right    13 years ago  . WRIST SURGERY     scar tissue removed   Social History   Socioeconomic History  . Marital status: Divorced    Spouse name: Not on file  . Number of children: 1  . Years of education: Not on file  . Highest education level: Not on file  Occupational History  . Occupation: Air cabin crew, retired 1997    Employer: [employer]  . Financial resource strain: Not on file  . Food insecurity:    Worry: Not on file    Inability: Not on file  . Transportation needs:    Medical: Not on file    Non-medical: Not on file  Tobacco Use  . Smoking status: Former Smoker    Types: Cigarettes, Cigars    Last attempt to quit: 08/22/1998    Years since quitting: 19.8  . Smokeless tobacco: Never Used  . Tobacco comment: cigarettes 1968, cigars 2000  Substance and Sexual Activity  . Alcohol use: Yes    Alcohol/week: 0.0 standard drinks    Comment: rare  . Drug use: No  . Sexual activity: Never  Lifestyle  . Physical activity:    Days per week: Not on file  Minutes per session: Not on file  . Stress: Not on file  Relationships  . Social connections:    Talks on phone: Not on file    Gets together: Not on file    Attends religious service: Not on file    Active member of club or organization: Not on file    Attends meetings of clubs or organizations: Not on file    Relationship status: Not on file  Other Topics Concern  . Not on file  Social History Narrative   Retired from [employer]   Prev divorced    1 son   [employer] '63-'67, no known agent orange exposure   Allergies  Allergen Reactions  . Chlordiazepoxide-Clidinium     REACTION: urinary retention  . Citalopram Hydrobromide     Intolerant, tried 8/201.  "It made me very angry".  . Erythromycin Base     REACTION: stomach upset  . Hyoscyamine Sulfate     REACTION: urinary retention  . Penicillins     REACTION: Itching, rash  . Shrimp  [Shellfish Allergy]     And crab- rash, GI upset, upper airway symptoms   Family History  Problem Relation Age of Onset  . Hypertension Mother   . Stroke Mother   . Pneumonia Father   . Bladder Cancer Father   . Cancer Maternal Aunt        ? type  . Breast cancer Maternal Aunt        mets  . Esophageal cancer Maternal Aunt        aunt ? esophageal ca  . Stomach cancer Maternal Aunt        aunt ? stmach ca  . Colon cancer Maternal Aunt        had colostomy - not sure of origin  . Cancer Maternal Grandmother   . Prostate cancer Neg Hx   . Rectal cancer Neg Hx      Current Outpatient Medications (Cardiovascular):  .  sildenafil (REVATIO) 20 MG tablet, Take 3-5 tablets (60-100 mg total) by mouth daily as needed.  Current Outpatient Medications (Respiratory):  .  diphenhydrAMINE (BENADRYL) 50 MG capsule, Take 50 mg by mouth at bedtime as needed. .  fluticasone (FLONASE) 50 MCG/ACT nasal spray, Place 2 sprays into both nostrils daily. Marland Kitchen  levocetirizine (XYZAL) 5 MG tablet, Take 1 tablet (5 mg total) by mouth every evening.    Current Outpatient Medications (Other):  .  b complex vitamins capsule, Take 1 capsule by mouth daily. .  Bimatoprost (LUMIGAN) 0.01 % SOLN, Apply 1 drop to eye daily.   .  cholecalciferol (VITAMIN D) 1000 UNITS tablet, Take 1,000 Units by mouth daily. Marland Kitchen  esomeprazole (NEXIUM) 40 MG capsule, TAKE 1 CAPSULE BY MOUTH TWICE EVERY DAY .  OVER THE COUNTER MEDICATION, Take 1 capsule by mouth daily. Reported on 08/18/2015 .  tamsulosin (FLOMAX) 0.4 MG CAPS capsule, TAKE 1-2 CAPSULES (0.4- 0.8 MG TOTAL) BY MOUTH DAILY. .  vitamin C (ASCORBIC ACID) 500 MG tablet, Take 500 mg by mouth daily. .  vitamin E 400 UNIT capsule, Take 400 Units by mouth daily.    Past medical history, social, surgical and family history all reviewed in electronic medical record.  No pertanent information unless stated regarding to the chief complaint.   Review of Systems:  No nausea,  vomiting, diarrhea, constipation,, abdominal pain, skin rash, fevers, chills, night sweats, weight loss, swollen lymph nodes, body aches, joint swelling, muscle aches, chest pain, shortness  of breath, mood changes.  Positive headaches, dizziness, visual changes  Objective  Blood pressure 140/90, pulse 68, height 5\' 10"  (1.778 m), weight 168 lb (76.2 kg), SpO2 98 %.    General: No apparent distress alert and oriented x3 mood and affect normal, dressed appropriately.  HEENT: Pupils equal, extraocular movements intact patient does have what appears to be a slight infection of the right eye. Respiratory: Patient's speak in full sentences and does not appear short of breath  Cardiovascular: No lower extremity edema, non tender, no erythema  Skin: Warm dry intact with no signs of infection or rash on extremities or on axial skeleton.  Abdomen: Soft nontender  Neuro: Cranial nerves II through XII are intact, neurovascularly intact in all extremities with 2+ DTRs and 2+ pulses.  Lymph: No lymphadenopathy of posterior or anterior cervical chain or axillae bilaterally.  Gait normal with good balance and coordination.  MSK:  Non tender with full range of motion and good stability and symmetric strength and tone of shoulders, elbows, wrist, hip, knee and ankles bilaterally.  Arthritic changes of multiple joints    Impression and Recommendations:     This case required medical decision making of moderate complexity. The above documentation has been reviewed and is accurate and complete Lyndal Pulley, DO       Note: This dictation was prepared with Dragon dictation along with smaller phrase technology. Any transcriptional errors that result from this process are unintentional.

## 2018-07-11 NOTE — Patient Instructions (Addendum)
Good to see you  I think you are on the mend Figure out the eye situation then send me an email on what you guys decided to do  If not a lot better after that would recommend doing the PT again  I think you will be yourself very soon  Otherwise see me again in 2 months and if not perfect we will discuss labs

## 2018-07-11 NOTE — Assessment & Plan Note (Signed)
I believe the patient's concussion is completely resolved at this time.  I believe that some of the fatigue is secondary to eyestrain that patient is being followed with the ophthalmologist.  I believe that patient continues to have vertigo symptoms secondary to more of a benign positional vertigo.  We discussed with patient though at this time that we will continue to monitor him though for the next 2 months to make sure he continues to improve.  Patient wants to get the eyes evaluated first and then will be considering the possibility of going back to physical therapy.  Patient will try the home exercises at this point.  Patient will follow-up with me again in 2 months  Spent  25 minutes with patient face-to-face and had greater than 50% of counseling including as described above in assessment and plan.

## 2018-08-03 ENCOUNTER — Ambulatory Visit: Payer: Medicare Other | Admitting: Family Medicine

## 2018-08-03 ENCOUNTER — Other Ambulatory Visit: Payer: Self-pay

## 2018-08-03 ENCOUNTER — Emergency Department: Payer: Medicare Other

## 2018-08-03 ENCOUNTER — Emergency Department
Admission: EM | Admit: 2018-08-03 | Discharge: 2018-08-03 | Disposition: A | Discharge status: Home / Self Care | Payer: Medicare Other | Attending: Emergency Medicine | Admitting: Emergency Medicine

## 2018-08-03 DIAGNOSIS — M79602 Pain in left arm: Secondary | ICD-10-CM | POA: Diagnosis not present

## 2018-08-03 DIAGNOSIS — Z87891 Personal history of nicotine dependence: Secondary | ICD-10-CM | POA: Insufficient documentation

## 2018-08-03 DIAGNOSIS — Z85828 Personal history of other malignant neoplasm of skin: Secondary | ICD-10-CM | POA: Insufficient documentation

## 2018-08-03 DIAGNOSIS — R0789 Other chest pain: Secondary | ICD-10-CM | POA: Diagnosis not present

## 2018-08-03 DIAGNOSIS — Z0289 Encounter for other administrative examinations: Secondary | ICD-10-CM

## 2018-08-03 DIAGNOSIS — Z79899 Other long term (current) drug therapy: Secondary | ICD-10-CM | POA: Diagnosis not present

## 2018-08-03 LAB — BASIC METABOLIC PANEL
Anion gap: 6 (ref 5–15)
BUN: 13 mg/dL (ref 8–23)
CO2: 29 mmol/L (ref 22–32)
Calcium: 8.7 mg/dL — ABNORMAL LOW (ref 8.9–10.3)
Chloride: 101 mmol/L (ref 98–111)
Creatinine, Ser: 0.97 mg/dL (ref 0.61–1.24)
GFR calc non Af Amer: >60 mL/min (ref >60)
Glucose, Bld: 142 mg/dL — ABNORMAL HIGH (ref 70–99)
Potassium: 4 mmol/L (ref 3.5–5.1)
Sodium: 136 mmol/L (ref 135–145)

## 2018-08-03 LAB — CBC WITH DIFFERENTIAL/PLATELET
Abs Immature Granulocytes: 0.09 10*3/uL — ABNORMAL HIGH (ref 0.00–0.07)
BASOS ABS: 0.1 10*3/uL (ref 0.0–0.1)
Basophils Relative: 1 %
Eosinophils Absolute: 0.1 10*3/uL (ref 0.0–0.5)
Eosinophils Relative: 1 %
HCT: 45 % (ref 39.0–52.0)
Hemoglobin: 15 g/dL (ref 13.0–17.0)
Immature Granulocytes: 1 %
LYMPHS ABS: 1.4 10*3/uL (ref 0.7–4.0)
Lymphocytes Relative: 10 %
MCH: 30.7 pg (ref 26.0–34.0)
MCHC: 33.3 g/dL (ref 30.0–36.0)
MCV: 92 fL (ref 80.0–100.0)
Monocytes Absolute: 0.6 10*3/uL (ref 0.1–1.0)
Monocytes Relative: 5 %
NRBC: 0 % (ref 0.0–0.2)
Neutro Abs: 11.2 10*3/uL — ABNORMAL HIGH (ref 1.7–7.7)
Neutrophils Relative %: 82 %
Platelets: 377 10*3/uL (ref 150–400)
RBC: 4.89 MIL/uL (ref 4.22–5.81)
RDW: 14.5 % (ref 11.5–15.5)
WBC: 13.4 10*3/uL — ABNORMAL HIGH (ref 4.0–10.5)

## 2018-08-03 LAB — TROPONIN I
Troponin I: <0.03 ng/mL (ref <0.03)
Troponin I: <0.03 ng/mL (ref <0.03)

## 2018-08-03 MED ORDER — ACYCLOVIR 800 MG PO TABS: 800.0000 mg | ORAL_TABLET | Freq: Every day | ORAL | 0 refills | Status: AC

## 2018-08-03 MED ORDER — OXYCODONE-ACETAMINOPHEN 5-325 MG PO TABS: 1.0000 | ORAL_TABLET | Freq: Four times a day (QID) | ORAL | 0 refills | Status: DC | PRN

## 2018-08-03 MED ORDER — OXYCODONE-ACETAMINOPHEN 5-325 MG PO TABS
1.0000 | ORAL_TABLET | Freq: Once | ORAL | Status: AC
  Administered 2018-08-03: 1 via ORAL
  Filled 2018-08-03: qty 1

## 2018-08-03 NOTE — ED Triage Notes (Signed)
Pt arrives to ed via ems from home. Ems states pt started experiencing chest pain around 1 pm today. Pain raidiating from left chest around to back. Pt states skin sensitivity on left side and back. Pt states also had severe chest pain on Monday and followed up with urgent care on wed. Pain started again today. Pt also states feels like left arm is weaker than normal. A&o x 4 on arrival, no acute distress at this time. Ems reports elevated bp, normally around 130/80. Today 206/105. Ems reports a rubbing sound in left lung fields.

## 2018-08-03 NOTE — ED Provider Notes (Signed)
Signout from Dr. Deboraha Sprang in this 72 year old male presenting with left-sided chest pain which is bandlike in distribution.  Suspicion was more for zoster prodrome than cardiac.  Plan is to check a second troponin.  Physical Exam  BP (!) 194/97 (BP Location: Left Arm)   Pulse (!) 58   Temp 98.8 F (37.1 C) (Oral)   Resp 16   SpO2 100%  ----------------------------------------- 6:27 PM on 08/03/2018 -----------------------------------------   Physical Exam Patient without any distress.  Resting calmly on his hallway stretcher.  ED Course/Procedures     Procedures  MDM  With second troponin which is negative.  Patient will be discharged at this time.  He is aware of the test results and need for follow-up as an outpatient.       Orbie Pyo, MD 08/03/18 [phone]

## 2018-08-03 NOTE — ED Provider Notes (Signed)
[facility] Emergency Department Provider Note ____________________________________________   First MD Initiated Contact with Patient 08/03/18 1410     (approximate)  I have reviewed the triage vital signs and the nursing notes.   HISTORY  Chief Complaint Chest Pain    HPI Mason [name] is a 72 y.o. male with PMH as noted below who presents with chest pain, primarily left-sided and radiating around to the back, associated with skin sensitivity in the area and occurring more severely since 1 PM today.  He states it radiated to the left arm as well.  He has had intermittent chest pain for approximately the last 5 days which comes in waves.  He denies any prior history of this.  When EMS arrived, the patient's blood pressure was elevated.  He denies associated shortness of breath, nausea or lightheadedness, or other acute symptoms.  Past Medical History:  Diagnosis Date  . Allergy   . Anal fissure   . Anxiety   . Cancer ([place])    basal cell removed x4  . Cataract    bil eyes  . Depression   . Diverticulosis of colon   . GERD (gastroesophageal reflux disease)    esophageal narrowing  . Glaucoma   . Hiatal hernia   . IBS (irritable bowel syndrome)   . Insomnia   . Internal hemorrhoids     Patient Active Problem List   Diagnosis Date Noted  . Tongue lesion 07/01/2018  . ETD (eustachian tube dysfunction) 07/01/2018  . Mild concussion 05/23/2018  . Health care maintenance 06/25/2017  . Trochanteric bursitis 06/25/2017  . Low HDL (under 40) 05/24/2016  . Advance care planning 05/02/2014  . BPH (benign prostatic hyperplasia) 12/10/2013  . Erectile dysfunction 08/26/2013  . GERD (gastroesophageal reflux disease) 06/28/2013  . Knee pain, left anterior 04/29/2013  . Medicare annual wellness visit, subsequent 11/04/2011  . Depression 04/01/2011  . Insomnia 12/16/2010  . VITAMIN D DEFICIENCY 07/06/2009  . ANXIETY 12/15/2007  . PEPTIC STRICTURE  12/15/2007  . HIATAL HERNIA 12/15/2007  . DIVERTICULOSIS, COLON W/O HEM 12/23/2006  . IRRITABLE BOWEL SYNDROME 12/23/2006    Past Surgical History:  Procedure Laterality Date  . ANAL SPHINCTEROTOMY  03/1995   fistulotomy  . APPENDECTOMY    . BASAL CELL CARCINOMA EXCISION     on back  . BICEPT TENODESIS Right 09/27/2004   decompression  . COLONOSCOPY  04/1999   internal hemmroids,12-11-08 colon tics and hems only   . ESOPHAGOGASTRODUODENOSCOPY  04/1999   with esophagitis and stricture  . MOHS SURGERY  09/02/2008   L supratip of nose with flap,basal cell cancer [facility]/MOHS  . TONSILLECTOMY AND ADENOIDECTOMY     remote  . UPPER GASTROINTESTINAL ENDOSCOPY    . WRIST SURGERY Right    13 years ago  . WRIST SURGERY     scar tissue removed    Prior to Admission medications   Medication Sig Start Date End Date Taking? Authorizing Provider  esomeprazole (NEXIUM) 40 MG capsule TAKE 1 CAPSULE BY MOUTH TWICE EVERY DAY 06/28/18  Yes Tonia Ghent, MD  fluticasone [facility]) 50 MCG/ACT nasal spray Place 2 sprays into both nostrils daily. 06/28/18  Yes Tonia Ghent, MD  levocetirizine (XYZAL) 5 MG tablet Take 1 tablet (5 mg total) by mouth every evening. 06/28/18  Yes Tonia Ghent, MD  sildenafil (REVATIO) 20 MG tablet Take 3-5 tablets (60-100 mg total) by mouth daily as needed. 06/28/18  Yes Tonia Ghent, MD  tamsulosin (  FLOMAX) 0.4 MG CAPS capsule TAKE 1-2 CAPSULES (0.4- 0.8 MG TOTAL) BY MOUTH DAILY. 06/28/18  Yes Tonia Ghent, MD  acyclovir (ZOVIRAX) 800 MG tablet Take 1 tablet (800 mg total) by mouth 5 (five) times daily for 7 days. 08/03/18 08/10/18  Arta Silence, MD  b complex vitamins capsule Take 1 capsule by mouth daily.    [provider]  Bimatoprost (LUMIGAN) 0.01 % SOLN Apply 1 drop to eye daily.      [provider]  cholecalciferol (VITAMIN D) 1000 UNITS tablet Take 1,000 Units by mouth daily.    [provider]  diphenhydrAMINE  (BENADRYL) 50 MG capsule Take 50 mg by mouth at bedtime as needed.    [provider]  OVER THE COUNTER MEDICATION Take 1 capsule by mouth daily. Reported on 08/18/2015    [provider]  oxyCODONE-acetaminophen (PERCOCET) 5-325 MG tablet Take 1 tablet by mouth every 6 (six) hours as needed for severe pain. 08/03/18   Arta Silence, MD  vitamin C (ASCORBIC ACID) 500 MG tablet Take 500 mg by mouth daily.    [provider]  vitamin E 400 UNIT capsule Take 400 Units by mouth daily.    [provider]  traZODone (DESYREL) 50 MG tablet 0.5-1 tab at night for insomnia. 12/16/10 11/03/11  Tonia Ghent, MD    Allergies Chlordiazepoxide-clidinium; Citalopram hydrobromide; Erythromycin base; Hyoscyamine sulfate; Penicillins; and Shrimp [shellfish allergy]  Family History  Problem Relation Age of Onset  . Hypertension Mother   . Stroke Mother   . Pneumonia Father   . Bladder Cancer Father   . Cancer Maternal Aunt        ? type  . Breast cancer Maternal Aunt        mets  . Esophageal cancer Maternal Aunt        aunt ? esophageal ca  . Stomach cancer Maternal Aunt        aunt ? stmach ca  . Colon cancer Maternal Aunt        had colostomy - not sure of origin  . Cancer Maternal Grandmother   . Prostate cancer Neg Hx   . Rectal cancer Neg Hx     Social History Social History   Tobacco Use  . Smoking status: Former Smoker    Types: Cigarettes, Cigars    Last attempt to quit: 08/22/1998    Years since quitting: 19.9  . Smokeless tobacco: Never Used  . Tobacco comment: cigarettes 1968, cigars 2000  Substance Use Topics  . Alcohol use: Yes    Alcohol/week: 0.0 standard drinks    Comment: rare  . Drug use: No    Review of Systems  Constitutional: No fever. Eyes: No redness. ENT: No neck pain. Cardiovascular: Positive for chest pain. Respiratory: Denies shortness of breath. Gastrointestinal: No vomiting or diarrhea.  Genitourinary:  Negative for flank pain.  Musculoskeletal: Positive for back pain. Skin: Negative for rash. Neurological: Negative for headaches, focal weakness or numbness.   ____________________________________________   PHYSICAL EXAM:  VITAL SIGNS: ED Triage Vitals  Enc Vitals Group     BP 08/03/18 1426 (!) 194/97     Pulse Rate 08/03/18 1426 (!) 58     Resp 08/03/18 1426 16     Temp 08/03/18 1426 98.8 F (37.1 C)     Temp Source 08/03/18 1426 Oral     SpO2 08/03/18 1420 90 %     Weight --      Height --  Head Circumference --      Peak Flow --      Pain Score 08/03/18 1426 5     Pain Loc --      Pain Edu? --      Excl. in [place]? --     Constitutional: Alert and oriented. Well appearing and in no acute distress. Eyes: Conjunctivae are normal.  Head: Atraumatic. Nose: No congestion/rhinnorhea. Mouth/Throat: Mucous membranes are moist.   Neck: Normal range of motion.  Cardiovascular: Normal rate, regular rhythm. Grossly normal heart sounds.  Good peripheral circulation. Respiratory: Normal respiratory effort.  No retractions. Lungs CTAB. Gastrointestinal: No distention.  Musculoskeletal: Extremities warm and well perfused.  Neurologic:  Normal speech and language. No gross focal neurologic deficits are appreciated.  Skin:  Skin is warm and dry. No rash noted. Psychiatric: Mood and affect are normal. Speech and behavior are normal.  ____________________________________________   LABS (all labs ordered are listed, but only abnormal results are displayed)  Labs Reviewed  BASIC METABOLIC PANEL - Abnormal; Notable for the following components:      Result Value   Glucose, Bld 142 (*)    Calcium 8.7 (*)    All other components within normal limits  CBC WITH DIFFERENTIAL/PLATELET - Abnormal; Notable for the following components:   WBC 13.4 (*)    Neutro Abs 11.2 (*)    Abs Immature Granulocytes 0.09 (*)    All other components within normal limits  TROPONIN I  TROPONIN I    ____________________________________________  EKG  ED ECG REPORT I, Arta Silence, the attending physician, personally viewed and interpreted this ECG.  Date: 08/03/2018 EKG Time: 1417 Rate: 55 Rhythm: normal sinus rhythm QRS Axis: normal Intervals: normal ST/T Wave abnormalities: normal Narrative Interpretation: no evidence of acute ischemia  ____________________________________________  RADIOLOGY  CXR: No focal infiltrate or other acute abnormality  ____________________________________________   PROCEDURES  Procedure(s) performed: No  Procedures  Critical Care performed: No ____________________________________________   INITIAL IMPRESSION / ASSESSMENT AND PLAN / ED COURSE  Pertinent labs & imaging results that were available during my care of the patient were reviewed by me and considered in my medical decision making (see chart for details).  72 year old male with PMH as noted above presents with atypical intermittent chest pain over the last several days, acutely worsened around 1 PM today and radiating both around the left side of his body towards his back, as well as into the left arm.  On exam the patient is very well-appearing.  His vital signs are normal except for hypertension which I think is most likely secondary to the pain.  The remainder of the exam is unremarkable.  His lungs are clear.  There is no visible rash or skin lesions in the area.  Overall the pain is highly atypical for ACS.  Given the skin sensitivity, the description of the pain is burning, and the bandlike location around the left side of his body it is actually most consistent with zoster.  Although the patient does not have a rash, it could be that he is having neuralgia prior to a rash manifesting itself.  Given the patient's age we will obtain lab work-up and troponins x2 to rule out ACS.  There is no clinical evidence of PE and no signs or symptoms to suggest DVT or PE.  There  is also no clinical evidence for aortic dissection or other vascular etiology given his lack of tachycardia, his well appearance, and the location of the pain.  If the work-up is negative I anticipate discharge home with empiric treatment for zoster.  ----------------------------------------- 4:43 PM on 08/03/2018 -----------------------------------------  Initial lab work-up is negative.  I ordered a repeat troponin.  I am signing the patient out to the oncoming physician Dr. Clearnce Hasten.  If the repeat troponin is negative the plan will be discharged home with empiric treatment for possible early onset shingles.  ____________________________________________   FINAL CLINICAL IMPRESSION(S) / ED DIAGNOSES  Final diagnoses:  Atypical chest pain      NEW MEDICATIONS STARTED DURING THIS VISIT:  New Prescriptions   ACYCLOVIR (ZOVIRAX) 800 MG TABLET    Take 1 tablet (800 mg total) by mouth 5 (five) times daily for 7 days.   OXYCODONE-ACETAMINOPHEN (PERCOCET) 5-325 MG TABLET    Take 1 tablet by mouth every 6 (six) hours as needed for severe pain.     Note:  This document was prepared using Dragon voice recognition software and may include unintentional dictation errors.    Arta Silence, MD 08/03/18 [phone]

## 2018-08-03 NOTE — Discharge Instructions (Signed)
It is likely that your pain is due to shingles, but the rash has not appeared yet.  Take the antiviral as prescribed and finish the full course.  Follow up with your regular doctor.  Return to the ER for new or worsening pain, difficulty breathing, weakness, fever, or any other new or worsening symptoms that concern you.

## 2018-08-06 ENCOUNTER — Telehealth: Payer: Self-pay | Admitting: Family Medicine

## 2018-08-06 MED ORDER — GABAPENTIN 300 MG PO CAPS: 300.0000 mg | ORAL_CAPSULE | Freq: Three times a day (TID) | ORAL | 0 refills | Status: DC | PRN

## 2018-08-06 MED ORDER — LIDOCAINE 5 % EX PTCH: 1.0000 | MEDICATED_PATCH | CUTANEOUS | 0 refills | Status: DC

## 2018-08-06 NOTE — Addendum Note (Signed)
Addended by: Tonia Ghent on: 08/06/2018 03:55 PM   Modules accepted: Orders

## 2018-08-06 NOTE — Telephone Encounter (Addendum)
I would try taking gabapentin.  rx sent.  Gradually go up on the dose.  Sedation caution.  Also see if he can get lidoderm patches filled to put on the area.   rx sent.   And I hope he feels better.   Thanks.

## 2018-08-06 NOTE — Telephone Encounter (Signed)
Pt called office to schedule a fu from being seen in the ER for shingles. Pt says he is suppose to take oxycodone every 6 hours. He feels that the pain is unbearable and wants to know if he can at least take ibuprofen or something in between as well since he knows he can't take another oxycodone. Please advise.

## 2018-08-06 NOTE — Telephone Encounter (Signed)
Patient advised.

## 2018-08-07 ENCOUNTER — Telehealth: Payer: Self-pay | Admitting: *Deleted

## 2018-08-07 ENCOUNTER — Encounter: Payer: Self-pay | Admitting: Family Medicine

## 2018-08-07 ENCOUNTER — Ambulatory Visit (INDEPENDENT_AMBULATORY_CARE_PROVIDER_SITE_OTHER): Payer: Medicare Other | Admitting: Family Medicine

## 2018-08-07 DIAGNOSIS — M792 Neuralgia and neuritis, unspecified: Secondary | ICD-10-CM | POA: Diagnosis not present

## 2018-08-07 MED ORDER — GABAPENTIN 300 MG PO CAPS: 300.0000 mg | ORAL_CAPSULE | Freq: Three times a day (TID) | ORAL | Status: DC | PRN

## 2018-08-07 MED ORDER — GABAPENTIN 100 MG PO CAPS: 100.0000 mg | ORAL_CAPSULE | Freq: Three times a day (TID) | ORAL | 0 refills | Status: DC | PRN

## 2018-08-07 NOTE — Telephone Encounter (Signed)
PA submitted thru [facility] for Lidocaine Patch and approved.

## 2018-08-07 NOTE — Patient Instructions (Signed)
Presumed shingles on the back and an irritated nerve in your neck.   Try cutting the gabapentin in half, if needed.  You can try the 100mg  tabs if needed.    Continue your other meds as is.  Update me as needed.
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
## 2018-08-08 DIAGNOSIS — M792 Neuralgia and neuritis, unspecified: Secondary | ICD-10-CM | POA: Insufficient documentation

## 2018-08-08 NOTE — Assessment & Plan Note (Signed)
He was previously vaccinated for shingles with Zostavax.  I think he likely has shingles affecting the left hemithorax giving him neuropathic pain and heightened sensitivity along that dermatome.  He did have some relief from gabapentin, less from oxycodone.  He does not have the same burning pain in the left arm.  I think that is a separate issue and I presume that he has a radicular source in his neck that is causing symptoms in the arm, at a different dermatome.  Discussed.  At this point he does not have any weakness.  He still okay for outpatient follow-up.  He does not have left leg symptoms that are worrisome.  I do not suspect CVA or other ominous diagnosis.  Reasonable to try gabapentin for both the thorax and arm symptoms.  It may be useful for both.  He was a little drowsy with 300 mg dosing.  I printed a prescription for 100 mg tablets if he is unable to cut his 300 mg pills.  He can use a lower dose of gabapentin and see if that is effective for pain relief without causing any other problems.  He agrees.  He will update me as needed.  >25 minutes spent in face to face time with patient, >50% spent in counselling or coordination of care.

## 2018-08-10 ENCOUNTER — Ambulatory Visit: Payer: Medicare Other | Admitting: Family Medicine
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
## 2018-09-12 ENCOUNTER — Ambulatory Visit (INDEPENDENT_AMBULATORY_CARE_PROVIDER_SITE_OTHER): Payer: Medicare Other | Admitting: Family Medicine

## 2018-09-12 ENCOUNTER — Encounter: Payer: Self-pay | Admitting: Family Medicine

## 2018-09-12 DIAGNOSIS — S060X0D Concussion without loss of consciousness, subsequent encounter: Secondary | ICD-10-CM

## 2018-09-12 NOTE — Assessment & Plan Note (Signed)
Patient seems to be doing very well.  Has been significant amount of time since we have seen patient.  Has been doing well with some intermittent visual trouble.  We discussed restarting the vestibular neuro training again which patient declined.  Patient is going continue with the home exercises, vitamin supplementations, patient will continue to increase activity as tolerated.  Patient wants to follow-up again in 2 months to make sure patient is completely improved.

## 2018-09-12 NOTE — Patient Instructions (Signed)
Good to see you  Overall you are ding better Ice is your friend New exercises for the neck area  Lets see you again in 6-8 weeks

## 2018-10-25 ENCOUNTER — Ambulatory Visit (INDEPENDENT_AMBULATORY_CARE_PROVIDER_SITE_OTHER): Payer: Medicare Other | Admitting: Family Medicine

## 2018-10-25 ENCOUNTER — Encounter: Payer: Self-pay | Admitting: Family Medicine

## 2018-10-25 VITALS — BP 150/88 | HR 69 | Temp 98.4°F | Ht 70.0 in | Wt 173.4 lb

## 2018-10-25 DIAGNOSIS — H6980 Other specified disorders of Eustachian tube, unspecified ear: Secondary | ICD-10-CM

## 2018-10-25 DIAGNOSIS — R238 Other skin changes: Secondary | ICD-10-CM | POA: Diagnosis not present

## 2018-10-25 MED ORDER — CLOTRIMAZOLE-BETAMETHASONE 1-0.05 % EX CREA: 1.0000 "application " | TOPICAL_CREAM | Freq: Two times a day (BID) | CUTANEOUS | 0 refills | Status: DC | PRN

## 2018-10-25 NOTE — Patient Instructions (Signed)
Use the cream as needed.   Keep using flonase and try to pop your ears gently.   We make arrangements for referrals, extra imaging, and other appointments based on the urgency of the situation. Referrals are handled based on the clinical situation, not in the order that they are placed. If you do not see one of our referral coordinators on the way out of the clinic today, then you should expect a call in the next 1 to 2 weeks. We work diligently to process all referrals as quickly as possible.    Take care.  Glad to see you.

## 2018-10-25 NOTE — Progress Notes (Signed)
Echoing in the ears.  He tried valsalva and flonase.   He gets better with yawning, that transiently helps.   Only with sx in the L ear.   No L ear pain.    This is different than tinnitus.    No FCNAVD.    Rectal itching.  Going on for the last month.  No blood in stool.  Irritated from wiping.  Witch hazel irritates the area.    Meds, vitals, and allergies reviewed.   ROS: Per HPI unless specifically indicated in ROS section   nad ncat Sluggish L TM movement on valsalva.  Scant wax in B canals.  TM wnl B o/w.  OP wnl Perirectal irritation bilaterally, symmetric, no ulceration.  No gross blood.  No external hemorrhoids noted.

## 2018-10-28 DIAGNOSIS — R238 Other skin changes: Secondary | ICD-10-CM | POA: Insufficient documentation

## 2018-10-28 NOTE — Assessment & Plan Note (Signed)
Sluggish tympanic membrane movement on Valsalva.  Continue Flonase.  Refer to ENT.  He agrees.

## 2018-10-28 NOTE — Assessment & Plan Note (Signed)
Discussed with patient about options.  Mild external irritation.  Reasonable to use Lotrisone as ordered and update me as needed.  He agrees.

## 2018-11-14 ENCOUNTER — Ambulatory Visit: Payer: Medicare Other | Admitting: Family Medicine

## 2018-12-10 ENCOUNTER — Encounter: Payer: Self-pay | Admitting: Family Medicine

## 2018-12-10 ENCOUNTER — Other Ambulatory Visit: Payer: Self-pay

## 2018-12-10 ENCOUNTER — Ambulatory Visit (INDEPENDENT_AMBULATORY_CARE_PROVIDER_SITE_OTHER): Payer: Medicare Other | Admitting: Family Medicine

## 2018-12-10 DIAGNOSIS — H9209 Otalgia, unspecified ear: Secondary | ICD-10-CM | POA: Diagnosis not present

## 2018-12-10 MED ORDER — NEOMYCIN-POLYMYXIN-HC 3.5-10000-1 OT SOLN: 3.0000 [drp] | Freq: Four times a day (QID) | OTIC | 0 refills | Status: DC

## 2018-12-10 NOTE — Patient Instructions (Signed)
Gently irrigate your ears with warm water in the shower to help with wax.  Use the drops if you have more ear canal pain or drainage.  No current sign of trauma or infection.  Take care.  Glad to see you.

## 2018-12-10 NOTE — Progress Notes (Signed)
Possible Qtip irritation in R ear.  Had some pain with Qtip use, since 12/07/2018.   R ear muffled yesterday AM but some better as the day went on.    No drainage.  No FCNAVD.    Meds, vitals, and allergies reviewed.   ROS: Per HPI unless specifically indicated in ROS section   nad ncat Scant wax B canals but TMs wnl.  Canals wnl B w/o lesion.  No TM erythema.  No drainage.   Mastoids not ttp B Hearing grossly intact.

## 2018-12-10 NOTE — Assessment & Plan Note (Signed)
Reassuring exam, d/w pt.  He could have irritated the canal with the qtip, advised against use.  Can irrigate at home as needed for wax- minimal amount in canal.  Can hold abx drops for now, in case he develops sx in the meantime.  See avs.  He agrees.

## 2019-01-23 ENCOUNTER — Ambulatory Visit (INDEPENDENT_AMBULATORY_CARE_PROVIDER_SITE_OTHER): Payer: Medicare Other | Admitting: Family Medicine

## 2019-01-23 ENCOUNTER — Other Ambulatory Visit: Payer: Self-pay

## 2019-01-23 ENCOUNTER — Encounter: Payer: Self-pay | Admitting: Family Medicine

## 2019-01-23 VITALS — BP 158/90 | HR 75 | Ht 70.0 in | Wt 173.0 lb

## 2019-01-23 DIAGNOSIS — M5030 Other cervical disc degeneration, unspecified cervical region: Secondary | ICD-10-CM | POA: Diagnosis not present

## 2019-01-23 DIAGNOSIS — M542 Cervicalgia: Secondary | ICD-10-CM

## 2019-01-23 MED ORDER — GABAPENTIN 100 MG PO CAPS: 200.0000 mg | ORAL_CAPSULE | Freq: Every day | ORAL | 3 refills | Status: DC

## 2019-01-23 MED ORDER — MELOXICAM 15 MG PO TABS: 15.0000 mg | ORAL_TABLET | Freq: Every day | ORAL | 0 refills | Status: DC

## 2019-01-23 NOTE — Assessment & Plan Note (Signed)
Discussed HEP we discussed with the possibility of an epidural.  Patient wants to avoid going to formal physical therapy during the coronavirus outbreak.  Meloxicam given warned of potential side effects.  Gabapentin low dose activities are beneficial.  Home exercise will be helpful as well.  Follow-up again in 4 to 6 weeks
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
## 2019-01-23 NOTE — Patient Instructions (Addendum)
Good to see you  Overall you are doing better Ice is your friend New exercises for the neck area   Gabapentin 200mg  at night Meloxicam daily for 10 days then as needed Epidural for the neck ordered and if not better in 3 weeks go through with it  Lets see you again in 4 weeks

## 2019-02-08 ENCOUNTER — Encounter: Payer: Self-pay | Admitting: Family Medicine

## 2019-02-15 ENCOUNTER — Ambulatory Visit
Admission: RE | Admit: 2019-02-15 | Discharge: 2019-02-15 | Disposition: A | Discharge status: Home / Self Care | Payer: Medicare Other | Source: Ambulatory Visit | Attending: Family Medicine | Admitting: Family Medicine

## 2019-02-15 ENCOUNTER — Other Ambulatory Visit: Payer: Self-pay | Admitting: Family Medicine

## 2019-02-15 ENCOUNTER — Other Ambulatory Visit: Payer: Self-pay

## 2019-02-15 DIAGNOSIS — M542 Cervicalgia: Secondary | ICD-10-CM

## 2019-02-15 MED ORDER — TRIAMCINOLONE ACETONIDE 40 MG/ML IJ SUSP (RADIOLOGY)
60.0000 mg | Freq: Once | INTRAMUSCULAR | Status: AC
  Administered 2019-02-15: 60 mg via EPIDURAL

## 2019-02-15 MED ORDER — IOPAMIDOL (ISOVUE-M 300) INJECTION 61%
1.0000 mL | Freq: Once | INTRAMUSCULAR | Status: AC | PRN
  Administered 2019-02-15: 1 mL via EPIDURAL

## 2019-02-15 NOTE — Discharge Instructions (Signed)

## 2019-02-20 ENCOUNTER — Ambulatory Visit: Payer: Medicare Other | Admitting: Family Medicine

## 2019-02-27 ENCOUNTER — Other Ambulatory Visit: Payer: Self-pay

## 2019-02-27 ENCOUNTER — Encounter: Payer: Self-pay | Admitting: Family Medicine

## 2019-02-27 ENCOUNTER — Ambulatory Visit (INDEPENDENT_AMBULATORY_CARE_PROVIDER_SITE_OTHER): Payer: Medicare Other | Admitting: Family Medicine

## 2019-02-27 VITALS — BP 130/82 | HR 73 | Ht 70.0 in | Wt 168.0 lb

## 2019-02-27 DIAGNOSIS — M5412 Radiculopathy, cervical region: Secondary | ICD-10-CM

## 2019-02-27 DIAGNOSIS — M5030 Other cervical disc degeneration, unspecified cervical region: Secondary | ICD-10-CM | POA: Diagnosis not present

## 2019-02-27 MED ORDER — GABAPENTIN 300 MG PO CAPS: 300.0000 mg | ORAL_CAPSULE | Freq: Every day | ORAL | 0 refills | Status: DC

## 2019-02-27 NOTE — Patient Instructions (Addendum)
Gabapentin 300 mg at night Repeat epidural Arm compression sleeve with a lot of activity Hands within peripheral vision See me again 2 weeks following epidural for virtual visit

## 2019-02-27 NOTE — Assessment & Plan Note (Signed)
Known degenerative disc disease.  Responded somewhat to the epidural.  I like to repeat it.  Increase gabapentin to 300 mg at night and can take the 200 mg during the day.  Patient understands it could be 300 mg 3 times a day could be beneficial as well.  We discussed icing regimen, home exercises.  Patient declined formal physical therapy.  Follow-up again in 2 to 3 weeks after the epidural.  Spent  25 minutes with patient face-to-face and had greater than 50% of counseling including as described above in assessment and plan.

## 2019-02-27 NOTE — Progress Notes (Signed)
[name] [address] [place], [place] [address] Phone: [phone] Subjective:   Mason [name], am serving as a scribe for Dr. Hulan Saas.    CC: Neck pain follow-up  LPF:[id number]   01/23/2019: Discussed HEP we discussed with the possibility of an epidural.  Patient wants to avoid going to formal physical therapy during the coronavirus outbreak.  Meloxicam given warned of potential side effects.  Gabapentin low dose activities are beneficial.  Home exercise will be helpful as well.  Follow-up again in 4 to 6 weeks  Update 02/27/2019: Mason [name] is a 73 y.o. male coming in with complaint of back pain. Had epidural on 6/26. Pain started to return on Sunday. Is using gabapentin. Had to stop Mobic as it gave him acid reflux. Is still having shoulder pain and has stopped the exercises because they were increasing his pain.  Patient states that overall though he still better than where he was prior to the injection.  Still at the end of a long day or any repetitive activities has more discomfort and pain.     Past Medical History:  Diagnosis Date  . Allergy   . Anal fissure   . Anxiety   . Cancer ([place])    basal cell removed x4  . Cataract    bil eyes  . Depression   . Diverticulosis of colon   . GERD (gastroesophageal reflux disease)    esophageal narrowing  . Glaucoma   . Hiatal hernia   . IBS (irritable bowel syndrome)   . Insomnia   . Internal hemorrhoids    Past Surgical History:  Procedure Laterality Date  . ANAL SPHINCTEROTOMY  03/1995   fistulotomy  . APPENDECTOMY    . BASAL CELL CARCINOMA EXCISION     on back  . BICEPT TENODESIS Right 09/27/2004   decompression  . COLONOSCOPY  04/1999   internal hemmroids,12-11-08 colon tics and hems only   . ESOPHAGOGASTRODUODENOSCOPY  04/1999   with esophagitis and stricture  . MOHS SURGERY  09/02/2008   L supratip of nose with flap,basal cell cancer [facility]/MOHS  . TONSILLECTOMY AND  ADENOIDECTOMY     remote  . UPPER GASTROINTESTINAL ENDOSCOPY    . WRIST SURGERY Right    13 years ago  . WRIST SURGERY     scar tissue removed   Social History   Socioeconomic History  . Marital status: Divorced    Spouse name: Not on file  . Number of children: 1  . Years of education: Not on file  . Highest education level: Not on file  Occupational History  . Occupation: Air cabin crew, retired 1997    Employer: [employer]  . Financial resource strain: Not on file  . Food insecurity    Worry: Not on file    Inability: Not on file  . Transportation needs    Medical: Not on file    Non-medical: Not on file  Tobacco Use  . Smoking status: Former Smoker    Types: Cigarettes, Cigars    Quit date: 08/22/1998    Years since quitting: 20.5  . Smokeless tobacco: Never Used  . Tobacco comment: cigarettes 1968, cigars 2000  Substance and Sexual Activity  . Alcohol use: Yes    Alcohol/week: 0.0 standard drinks    Comment: rare  . Drug use: [name]  . Sexual activity: Never  Lifestyle  . Physical activity    Days per week: Not on file  Minutes per session: Not on file  . Stress: Not on file  Relationships  . Social Herbalist on phone: Not on file    Gets together: Not on file    Attends religious service: Not on file    Active member of club or organization: Not on file    Attends meetings of clubs or organizations: Not on file    Relationship status: Not on file  Other Topics Concern  . Not on file  Social History Narrative   Retired from [employer]   Prev divorced    1 son   [employer] '63-'67, [name] known agent orange exposure   Allergies  Allergen Reactions  . Chlordiazepoxide-Clidinium     REACTION: urinary retention  . Citalopram Hydrobromide     Intolerant, tried 03/2011.  "It made me very angry".  . Erythromycin Base     REACTION: stomach upset  . Hyoscyamine Sulfate     REACTION: urinary retention  . Penicillins      REACTION: Itching, rash  . Shrimp [Shellfish Allergy]     And crab- rash, GI upset, upper airway symptoms   Family History  Problem Relation Age of Onset  . Hypertension Mother   . Stroke Mother   . Pneumonia Father   . Bladder Cancer Father   . Cancer Maternal Aunt        ? type  . Breast cancer Maternal Aunt        mets  . Esophageal cancer Maternal Aunt        aunt ? esophageal ca  . Stomach cancer Maternal Aunt        aunt ? stmach ca  . Colon cancer Maternal Aunt        had colostomy - not sure of origin  . Cancer Maternal Grandmother   . Prostate cancer Neg Hx   . Rectal cancer Neg Hx      Current Outpatient Medications (Cardiovascular):  .  sildenafil (REVATIO) 20 MG tablet, Take 3-5 tablets (60-100 mg total) by mouth daily as needed.  Current Outpatient Medications (Respiratory):  .  diphenhydrAMINE (BENADRYL) 50 MG capsule, Take 50 mg by mouth at bedtime as needed. .  fluticasone (FLONASE) 50 MCG/ACT nasal spray, Place 2 sprays into both nostrils daily. Marland Kitchen  levocetirizine (XYZAL) 5 MG tablet, Take 1 tablet (5 mg total) by mouth every evening.  Current Outpatient Medications (Analgesics):  .  meloxicam (MOBIC) 15 MG tablet, Take 1 tablet (15 mg total) by mouth daily.   Current Outpatient Medications (Other):  .  b complex vitamins capsule, Take 1 capsule by mouth daily. .  Bimatoprost (LUMIGAN) 0.01 % SOLN, Apply 1 drop to eye daily.   .  cholecalciferol (VITAMIN D) 1000 UNITS tablet, Take 1,000 Units by mouth daily. .  clotrimazole-betamethasone (LOTRISONE) cream, Apply 1 application topically 2 (two) times daily as needed. Marland Kitchen  esomeprazole (NEXIUM) 40 MG capsule, TAKE 1 CAPSULE BY MOUTH TWICE EVERY DAY .  gabapentin (NEURONTIN) 100 MG capsule, TAKE 2 CAPSULES (200 MG TOTAL) BY MOUTH AT BEDTIME. Marland Kitchen  gabapentin (NEURONTIN) 300 MG capsule, Take 1 capsule (300 mg total) by mouth at bedtime. Marland Kitchen  neomycin-polymyxin-hydrocortisone (CORTISPORIN) OTIC solution, Place 3  drops into the right ear 4 (four) times daily. Marland Kitchen  OVER THE COUNTER MEDICATION, Take 1 capsule by mouth daily. Reported on 08/18/2015 .  tamsulosin (FLOMAX) 0.4 MG CAPS capsule, TAKE 1-2 CAPSULES (0.4- 0.8 MG TOTAL) BY MOUTH DAILY. Marland Kitchen  vitamin C (ASCORBIC ACID) 500 MG tablet, Take 500 mg by mouth daily. .  vitamin E 400 UNIT capsule, Take 400 Units by mouth daily.    Past medical history, social, surgical and family history all reviewed in electronic medical record.  [name] pertanent information unless stated regarding to the chief complaint.   Review of Systems:  [name]  visual changes, nausea, vomiting, diarrhea, constipation, dizziness, abdominal pain, skin rash, fevers, chills, night sweats, weight loss, swollen lymph nodes, body aches, joint swelling,chest pain, shortness of breath, mood changes.  Positive muscle aches mild headaches  Objective  Blood pressure 130/82, pulse 73, height 5\' 10"  (1.778 m), weight 168 lb (76.2 kg), SpO2 98 %.    General: [name] apparent distress alert and oriented x3 mood and affect normal, dressed appropriately.  HEENT: Pupils equal, extraocular movements intact  Respiratory: Patient's speak in full sentences and does not appear short of breath  Cardiovascular: [name] lower extremity edema, non tender, [name] erythema  Skin: Warm dry intact with [name] signs of infection or rash on extremities or on axial skeleton.  Abdomen: Soft nontender  Neuro: Cranial nerves II through XII are intact, neurovascularly intact in all extremities with 2+ DTRs and 2+ pulses.  Lymph: [name] lymphadenopathy of posterior or anterior cervical chain or axillae bilaterally.  Gait normal with good balance and coordination.  MSK:  Non tender with full range of motion and good stability and symmetric strength and tone of shoulders, elbows, wrist, hip, knee and ankles bilaterally.  Neck: Inspection loss of lordosis. [name] palpable stepoffs. Positive Spurling's maneuver right side. Limited sidebending and  rotation Grip strength and sensation normal in bilateral hands Strength good C4 to T1 distribution [name] sensory change to C4 to T1 Negative Hoffman sign bilaterally Reflexes normal Tightness of the right trapezius.  Mild weakness actually of the scapular area as well on the right compared to left.   Impression and Recommendations:     This case required medical decision making of moderate complexity. The above documentation has been reviewed and is accurate and complete Lyndal Pulley, DO       Note: This dictation was prepared with Dragon dictation along with smaller phrase technology. Any transcriptional errors that result from this process are unintentional.

## 2019-03-18 ENCOUNTER — Ambulatory Visit
Admission: RE | Admit: 2019-03-18 | Discharge: 2019-03-18 | Disposition: A | Discharge status: Home / Self Care | Payer: Medicare Other | Source: Ambulatory Visit | Attending: Family Medicine | Admitting: Family Medicine

## 2019-03-18 ENCOUNTER — Other Ambulatory Visit: Payer: Self-pay

## 2019-03-18 DIAGNOSIS — M5412 Radiculopathy, cervical region: Secondary | ICD-10-CM

## 2019-03-18 MED ORDER — TRIAMCINOLONE ACETONIDE 40 MG/ML IJ SUSP (RADIOLOGY)
60.0000 mg | Freq: Once | INTRAMUSCULAR | Status: AC
  Administered 2019-03-18: 17:00:00 60 mg via EPIDURAL

## 2019-03-18 MED ORDER — IOPAMIDOL (ISOVUE-M 300) INJECTION 61%
1.0000 mL | Freq: Once | INTRAMUSCULAR | Status: AC
  Administered 2019-03-18: 17:00:00 1 mL via EPIDURAL

## 2019-03-18 NOTE — Discharge Instructions (Signed)

## 2019-04-25 ENCOUNTER — Other Ambulatory Visit: Payer: Self-pay

## 2019-04-25 ENCOUNTER — Ambulatory Visit (INDEPENDENT_AMBULATORY_CARE_PROVIDER_SITE_OTHER): Payer: Medicare Other

## 2019-04-25 DIAGNOSIS — Z23 Encounter for immunization: Secondary | ICD-10-CM | POA: Diagnosis not present

## 2019-06-18 ENCOUNTER — Other Ambulatory Visit: Payer: Self-pay | Admitting: Family Medicine

## 2019-06-18 DIAGNOSIS — Z125 Encounter for screening for malignant neoplasm of prostate: Secondary | ICD-10-CM

## 2019-06-18 DIAGNOSIS — E786 Lipoprotein deficiency: Secondary | ICD-10-CM

## 2019-06-25 ENCOUNTER — Ambulatory Visit (INDEPENDENT_AMBULATORY_CARE_PROVIDER_SITE_OTHER): Payer: Medicare Other | Admitting: Family Medicine

## 2019-06-25 ENCOUNTER — Encounter: Payer: Self-pay | Admitting: Neurology

## 2019-06-25 ENCOUNTER — Encounter: Payer: Self-pay | Admitting: Family Medicine

## 2019-06-25 DIAGNOSIS — M5030 Other cervical disc degeneration, unspecified cervical region: Secondary | ICD-10-CM | POA: Diagnosis not present

## 2019-06-25 DIAGNOSIS — R519 Headache, unspecified: Secondary | ICD-10-CM

## 2019-06-25 DIAGNOSIS — G44329 Chronic post-traumatic headache, not intractable: Secondary | ICD-10-CM | POA: Diagnosis not present

## 2019-06-25 DIAGNOSIS — G8929 Other chronic pain: Secondary | ICD-10-CM

## 2019-06-25 DIAGNOSIS — M542 Cervicalgia: Secondary | ICD-10-CM | POA: Diagnosis not present

## 2019-06-25 DIAGNOSIS — G44309 Post-traumatic headache, unspecified, not intractable: Secondary | ICD-10-CM | POA: Insufficient documentation

## 2019-06-25 MED ORDER — TIZANIDINE HCL 2 MG PO TABS: 2.0000 mg | ORAL_TABLET | Freq: Three times a day (TID) | ORAL | 1 refills | Status: DC | PRN
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
## 2019-06-28 ENCOUNTER — Other Ambulatory Visit: Payer: Self-pay

## 2019-06-28 ENCOUNTER — Other Ambulatory Visit (INDEPENDENT_AMBULATORY_CARE_PROVIDER_SITE_OTHER): Payer: Medicare Other

## 2019-06-28 DIAGNOSIS — E786 Lipoprotein deficiency: Secondary | ICD-10-CM

## 2019-06-28 DIAGNOSIS — Z125 Encounter for screening for malignant neoplasm of prostate: Secondary | ICD-10-CM | POA: Diagnosis not present

## 2019-06-28 LAB — COMPREHENSIVE METABOLIC PANEL
ALT: 21 U/L (ref 0–53)
AST: 22 U/L (ref 0–37)
Albumin: 4.1 g/dL (ref 3.5–5.2)
Alkaline Phosphatase: 58 U/L (ref 39–117)
BUN: 15 mg/dL (ref 6–23)
CO2: 34 mEq/L — ABNORMAL HIGH (ref 19–32)
Calcium: 9.1 mg/dL (ref 8.4–10.5)
Chloride: 101 mEq/L (ref 96–112)
Creatinine, Ser: 0.94 mg/dL (ref 0.40–1.50)
GFR: 78.27 mL/min (ref >60.00)
Glucose, Bld: 89 mg/dL (ref 70–99)
Potassium: 4.8 mEq/L (ref 3.5–5.1)
Sodium: 140 mEq/L (ref 135–145)
Total Bilirubin: 0.5 mg/dL (ref 0.2–1.2)
Total Protein: 6.7 g/dL (ref 6.0–8.3)

## 2019-06-28 LAB — LIPID PANEL
Cholesterol: 155 mg/dL (ref 0–200)
HDL: 39.2 mg/dL (ref >39.00)
LDL Cholesterol: 96 mg/dL (ref 0–99)
NonHDL: 115.58
Total CHOL/HDL Ratio: 4
Triglycerides: 98 mg/dL (ref 0.0–149.0)
VLDL: 19.6 mg/dL (ref 0.0–40.0)

## 2019-06-28 LAB — PSA, MEDICARE: PSA: 4.06 ng/ml — ABNORMAL HIGH (ref 0.10–4.00)

## 2019-07-01 ENCOUNTER — Other Ambulatory Visit: Payer: Self-pay | Admitting: Family Medicine

## 2019-07-02 ENCOUNTER — Ambulatory Visit: Payer: Medicare Other

## 2019-07-03 ENCOUNTER — Encounter: Payer: Self-pay | Admitting: Family Medicine

## 2019-07-04 ENCOUNTER — Ambulatory Visit: Payer: Medicare Other

## 2019-07-04 ENCOUNTER — Ambulatory Visit (INDEPENDENT_AMBULATORY_CARE_PROVIDER_SITE_OTHER): Payer: Medicare Other | Admitting: Family Medicine

## 2019-07-04 ENCOUNTER — Encounter: Payer: Self-pay | Admitting: Family Medicine

## 2019-07-04 ENCOUNTER — Other Ambulatory Visit: Payer: Self-pay

## 2019-07-04 VITALS — BP 140/76 | HR 68 | Temp 97.6°F | Ht 70.0 in | Wt 173.2 lb

## 2019-07-04 DIAGNOSIS — R972 Elevated prostate specific antigen [PSA]: Secondary | ICD-10-CM

## 2019-07-04 DIAGNOSIS — Z0000 Encounter for general adult medical examination without abnormal findings: Secondary | ICD-10-CM

## 2019-07-04 DIAGNOSIS — K219 Gastro-esophageal reflux disease without esophagitis: Secondary | ICD-10-CM | POA: Diagnosis not present

## 2019-07-04 DIAGNOSIS — Z125 Encounter for screening for malignant neoplasm of prostate: Secondary | ICD-10-CM

## 2019-07-04 DIAGNOSIS — M792 Neuralgia and neuritis, unspecified: Secondary | ICD-10-CM

## 2019-07-04 NOTE — Patient Instructions (Addendum)
Recheck PSA in about 6 months.   Please schedule a nonfasting lab visit.  Update me as needed.   I'll await the notes from neurology.  Take care.  Glad to see you.

## 2019-07-04 NOTE — Progress Notes (Signed)
I have personally reviewed the Medicare Annual Wellness questionnaire and have noted 1. The patient's medical and social history 2. Their use of alcohol, tobacco or illicit drugs 3. Their current medications and supplements 4. The patient's functional ability including ADL's, fall risks, home safety risks and hearing or visual             impairment. 5. Diet and physical activities 6. Evidence for depression or mood disorders  The patients weight, height, BMI have been recorded in the chart and visual acuity is per eye clinic.  I have made referrals, counseling and provided education to the patient based review of the above and I have provided the pt with a written personalized care plan for preventive services.  Provider list updated- see scanned forms.  Routine anticipatory guidance given to patient.  See health maintenance. The possibility exists that previously documented standard health maintenance information may have been brought forward from a previous encounter into this note.  If needed, that same information has been updated to reflect the current situation based on today's encounter.    Flu up-to-date Shingles done through [place] [facility].  Discussed with patient. PNA up-to-date. Tetanus up-to-date  colon cancer screening done with colonoscopy 2016. Prostate cancer screening 2020, see below. Advance directive-son designated if patient were incapacitated. Cognitive function addressed- see scanned forms- and if abnormal then additional documentation follows.    PSA elevation d/w pt.  Still on flomax at baseline.  Usually taking 0.4mg  a day, occ with higher dose if needed.  Nocturia at baseline, twice per night.  He has had variable PSAs over the past few years.  Discussed.  GERD controlled with PPI.  D/w pt.  No ADE on med.    He had been taking gabapentin for neck pain at night.  He is going to see PT and has neurology f/u pending.   PMH and SH reviewed  Meds, vitals, and  allergies reviewed.   ROS: Per HPI.  Unless specifically indicated otherwise in HPI, the patient denies:  General: fever. Eyes: acute vision changes ENT: sore throat Cardiovascular: chest pain Respiratory: SOB GI: vomiting GU: dysuria Musculoskeletal: acute back pain Derm: acute rash Neuro: acute motor dysfunction Psych: worsening mood Endocrine: polydipsia Heme: bleeding Allergy: hayfever  GEN: nad, alert and oriented HEENT: ncat NECK: supple w/o LA CV: rrr. PULM: ctab, no inc wob ABD: soft, +bs EXT: no edema SKIN: no acute rash  Health Maintenance  Topic Date Due  . TETANUS/TDAP  11/02/2021  . COLONOSCOPY  08/25/2024  . INFLUENZA VACCINE  Completed  . Hepatitis C Screening  Completed  . PNA vac Low Risk Adult  Completed

## 2019-07-07 NOTE — Assessment & Plan Note (Signed)
He had been taking gabapentin for neck pain at night.  He is going to see PT and has neurology f/u pending.  I will await the follow-up notes.  He agrees.

## 2019-07-07 NOTE — Assessment & Plan Note (Signed)
GERD controlled with PPI.  D/w pt.  No ADE on med.   Continue as is.  He agrees.

## 2019-07-07 NOTE — Assessment & Plan Note (Signed)
Flu up-to-date Shingles done through [place] [facility].  Discussed with patient. PNA up-to-date. Tetanus up-to-date  colon cancer screening done with colonoscopy 2016. Prostate cancer screening 2020, see below. Advance directive-son designated if patient were incapacitated. Cognitive function addressed- see scanned forms- and if abnormal then additional documentation follows.

## 2019-07-07 NOTE — Assessment & Plan Note (Signed)
PSA elevation d/w pt.  Still on flomax at baseline.  Usually taking 0.4mg  a day, occ with higher dose if needed.  Nocturia at baseline, twice per night.  He has had variable PSAs over the past few years.  Discussed.  This is not emergent and it is reasonable to recheck PSA in about 6 months.  Ordered.  Discussed.  He agrees with plan.

## 2019-07-22 ENCOUNTER — Encounter: Payer: Self-pay | Admitting: Family Medicine
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
## 2019-07-30 ENCOUNTER — Other Ambulatory Visit: Payer: Self-pay

## 2019-07-30 ENCOUNTER — Telehealth (INDEPENDENT_AMBULATORY_CARE_PROVIDER_SITE_OTHER): Payer: Medicare Other | Admitting: Neurology

## 2019-07-30 ENCOUNTER — Encounter: Payer: Self-pay | Admitting: Neurology

## 2019-07-30 VITALS — Ht 70.0 in | Wt 175.0 lb

## 2019-07-30 DIAGNOSIS — M542 Cervicalgia: Secondary | ICD-10-CM

## 2019-07-30 DIAGNOSIS — G4486 Cervicogenic headache: Secondary | ICD-10-CM

## 2019-07-30 DIAGNOSIS — R519 Headache, unspecified: Secondary | ICD-10-CM | POA: Diagnosis not present

## 2019-07-30 MED ORDER — GABAPENTIN 300 MG PO CAPS: 300.0000 mg | ORAL_CAPSULE | Freq: Two times a day (BID) | ORAL | 3 refills | Status: DC

## 2019-07-30 MED ORDER — TIZANIDINE HCL 2 MG PO TABS: 2.0000 mg | ORAL_TABLET | Freq: Three times a day (TID) | ORAL | 3 refills | Status: DC | PRN

## 2019-07-31 ENCOUNTER — Encounter: Payer: Self-pay | Admitting: Family Medicine

## 2019-07-31 ENCOUNTER — Ambulatory Visit (INDEPENDENT_AMBULATORY_CARE_PROVIDER_SITE_OTHER): Payer: Medicare Other | Admitting: Family Medicine

## 2019-07-31 DIAGNOSIS — G44329 Chronic post-traumatic headache, not intractable: Secondary | ICD-10-CM

## 2019-07-31 DIAGNOSIS — M5030 Other cervical disc degeneration, unspecified cervical region: Secondary | ICD-10-CM

## 2019-07-31 MED ORDER — GABAPENTIN 100 MG PO CAPS: 100.0000 mg | ORAL_CAPSULE | Freq: Two times a day (BID) | ORAL | 3 refills | Status: DC

## 2019-07-31 NOTE — Progress Notes (Signed)
Virtual Visit via Video Note  I connected with Mason [name] on 07/31/19 at 11:45 AM EST by a video enabled telemedicine application and verified that I am speaking with the correct person using two identifiers.  Location: Patient: Patient is in home setting alone Provider: In office setting   I discussed the limitations of evaluation and management by telemedicine and the availability of in person appointments. The patient expressed understanding and agreed to proceed.  History of Present Illness: 73 year old gentleman who continues to have more posttraumatic headaches.  Discussed with patient about icing regimen and home exercises.  Patient did not respond well to the epidurals of the neck.  Did follow-up with neurology recently and was to increase gabapentin.  Concerned that 300 mg during the day would be too much.  Patient is wondering if he can titrate with 100 mg tabs.  Patient also was to increase his muscle relaxer to 3 times a day.  Patient is also concerned with this.  Patient states that otherwise his mental state seems to be doing relatively well.  No significant other difficulties.  Continues to stay home most of the time and feels though that the physical therapy they have been doing has improved slowly.    Observations/Objective: Alert and oriented x3, sitting comfortably at his computer screen   Assessment and Plan: 73 year old gentleman with cervicogenic headaches and what appears to be more of a posttraumatic headaches.  Patient has seen neurology and they encouraged him to increase the gabapentin to 300 mg, we are going to do 1 to 200 mg twice a day and 300 mg at night.  We will see how patient responds to this without the somnolence.  Encouraged him to try the muscle relaxer on a regular basis.  Continue the vitamin supplementations and continue the physical therapy.  Follow-up with me again in 6 weeks   Follow Up Instructions: 6 weeks virtual    I discussed the  assessment and treatment plan with the patient. The patient was provided an opportunity to ask questions and all were answered. The patient agreed with the plan and demonstrated an understanding of the instructions.   The patient was advised to call back or seek an in-person evaluation if the symptoms worsen or if the condition fails to improve as anticipated.  I provided 26 minutes of non-face-to-face time during this encounter.   Lyndal Pulley, DO

## 2019-09-06 ENCOUNTER — Encounter: Payer: Self-pay | Admitting: Family Medicine

## 2019-09-09 ENCOUNTER — Ambulatory Visit: Payer: Medicare Other | Attending: Internal Medicine

## 2019-09-09 DIAGNOSIS — Z20822 Contact with and (suspected) exposure to covid-19: Secondary | ICD-10-CM

## 2019-09-10 LAB — NOVEL CORONAVIRUS, NAA: SARS-CoV-2, NAA: NOT DETECTED

## 2019-09-11 ENCOUNTER — Encounter: Payer: Self-pay | Admitting: Family Medicine

## 2019-09-11 ENCOUNTER — Ambulatory Visit (INDEPENDENT_AMBULATORY_CARE_PROVIDER_SITE_OTHER): Payer: Medicare Other | Admitting: Family Medicine

## 2019-09-11 DIAGNOSIS — G44329 Chronic post-traumatic headache, not intractable: Secondary | ICD-10-CM | POA: Diagnosis not present

## 2019-09-11 DIAGNOSIS — M5030 Other cervical disc degeneration, unspecified cervical region: Secondary | ICD-10-CM | POA: Diagnosis not present
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
## 2019-09-14 ENCOUNTER — Encounter: Payer: Self-pay | Admitting: Family Medicine

## 2019-09-16 ENCOUNTER — Ambulatory Visit: Payer: Medicare Other | Admitting: Family Medicine

## 2019-09-16 ENCOUNTER — Encounter: Payer: Self-pay | Admitting: Family Medicine

## 2019-09-16 DIAGNOSIS — R509 Fever, unspecified: Secondary | ICD-10-CM

## 2019-09-16 NOTE — Telephone Encounter (Signed)
Dr Damita Dunnings I printed these and placed it in your inbox

## 2019-09-16 NOTE — Progress Notes (Signed)
Interactive audio and video telecommunications were attempted between this provider and patient, however failed, due to patient having technical difficulties OR patient did not have access to video capability.  We continued and completed visit with audio only.   Virtual Visit via Telephone Note  I connected with patient on 09/16/19  at 1:03 PM  by telephone and verified that I am speaking with the correct person using two identifiers.  Location of patient: home.    Location of MD: [facility] Name of referring provider (if blank then none associated): Names per persons and role in encounter:  MD: Earlyne Iba, Patient: name listed above.    I discussed the limitations, risks, security and privacy concerns of performing an evaluation and management service by telephone and the availability of in person appointments. I also discussed with the patient that there may be a patient responsible charge related to this service. The patient expressed understanding and agreed to proceed.  CC:   History of Present Illness:   He had 2nd shingrix shot 08/28/2019. He was achy as expected after that, but that resolved.  Then on the 14th and the 15th he didn't feel well.  Fatigued, diffusely weak in the meantime.  Temp up to 103.6 on 09/07/19.  He has been taking tylenol and ibuprofen in the meantime.  Still having fevers sweats and chills since 09/07/19.  No vomiting, no diarrhea.  Not SOB.  He feels weak/fatigued.    Prev covid test neg x2.  Was seen at [facility] with elevated WBC and LFTs.  Minimal cough.  Some occ post nasal gtt, throat clearing.  No loss of taste or smell.     He'll send a copy of labs via mychart. He isn't lightheaded.    Observations/Objective: nad Speech wnl.   Assessment and Plan: Options d/w pt.  Needs UC eval given fever, instead of coming to clinic.  Our current clinic protocol, and due to the pandemic, we are not seeing febrile patients here in the clinic. I called over to  urgent care, they are able to see the patient today, they will work him into the schedule, and I will await their reports.  See follow-up notes.  Patient agrees with this plan. No charge for visit as patient is being routed to urgent care.   Follow Up Instructions: Patient is in route to urgent care.   I discussed the assessment and treatment plan with the patient. The patient was provided an opportunity to ask questions and all were answered. The patient agreed with the plan and demonstrated an understanding of the instructions.   The patient was advised to call back or seek an in-person evaluation if the symptoms worsen or if the condition fails to improve as anticipated.   Elsie Stain, MD

## 2019-09-17 ENCOUNTER — Encounter: Payer: Self-pay | Admitting: Family Medicine

## 2019-09-18 ENCOUNTER — Inpatient Hospital Stay
Admission: EM | Admit: 2019-09-18 | Discharge: 2019-09-22 | DRG: 391 | Disposition: A | Discharge status: Home / Self Care | Payer: Medicare Other | Attending: Internal Medicine | Admitting: Internal Medicine

## 2019-09-18 ENCOUNTER — Other Ambulatory Visit: Payer: Self-pay

## 2019-09-18 ENCOUNTER — Emergency Department: Payer: Medicare Other

## 2019-09-18 ENCOUNTER — Telehealth: Payer: Self-pay

## 2019-09-18 DIAGNOSIS — N401 Enlarged prostate with lower urinary tract symptoms: Secondary | ICD-10-CM | POA: Diagnosis present

## 2019-09-18 DIAGNOSIS — Z880 Allergy status to penicillin: Secondary | ICD-10-CM | POA: Diagnosis not present

## 2019-09-18 DIAGNOSIS — R945 Abnormal results of liver function studies: Secondary | ICD-10-CM | POA: Diagnosis present

## 2019-09-18 DIAGNOSIS — H269 Unspecified cataract: Secondary | ICD-10-CM | POA: Diagnosis present

## 2019-09-18 DIAGNOSIS — Z79899 Other long term (current) drug therapy: Secondary | ICD-10-CM

## 2019-09-18 DIAGNOSIS — Z800 Family history of malignant neoplasm of digestive organs: Secondary | ICD-10-CM

## 2019-09-18 DIAGNOSIS — K5732 Diverticulitis of large intestine without perforation or abscess without bleeding: Secondary | ICD-10-CM | POA: Diagnosis not present

## 2019-09-18 DIAGNOSIS — I81 Portal vein thrombosis: Secondary | ICD-10-CM | POA: Diagnosis present

## 2019-09-18 DIAGNOSIS — R338 Other retention of urine: Secondary | ICD-10-CM | POA: Diagnosis present

## 2019-09-18 DIAGNOSIS — F329 Major depressive disorder, single episode, unspecified: Secondary | ICD-10-CM | POA: Diagnosis present

## 2019-09-18 DIAGNOSIS — Z8052 Family history of malignant neoplasm of bladder: Secondary | ICD-10-CM | POA: Diagnosis not present

## 2019-09-18 DIAGNOSIS — Z8249 Family history of ischemic heart disease and other diseases of the circulatory system: Secondary | ICD-10-CM

## 2019-09-18 DIAGNOSIS — H409 Unspecified glaucoma: Secondary | ICD-10-CM | POA: Diagnosis present

## 2019-09-18 DIAGNOSIS — I4891 Unspecified atrial fibrillation: Secondary | ICD-10-CM | POA: Diagnosis present

## 2019-09-18 DIAGNOSIS — K219 Gastro-esophageal reflux disease without esophagitis: Secondary | ICD-10-CM | POA: Diagnosis present

## 2019-09-18 DIAGNOSIS — Z85828 Personal history of other malignant neoplasm of skin: Secondary | ICD-10-CM | POA: Diagnosis not present

## 2019-09-18 DIAGNOSIS — K5792 Diverticulitis of intestine, part unspecified, without perforation or abscess without bleeding: Secondary | ICD-10-CM | POA: Diagnosis present

## 2019-09-18 DIAGNOSIS — G4700 Insomnia, unspecified: Secondary | ICD-10-CM | POA: Diagnosis present

## 2019-09-18 DIAGNOSIS — Z87891 Personal history of nicotine dependence: Secondary | ICD-10-CM | POA: Diagnosis not present

## 2019-09-18 DIAGNOSIS — Z803 Family history of malignant neoplasm of breast: Secondary | ICD-10-CM | POA: Diagnosis not present

## 2019-09-18 DIAGNOSIS — Z20822 Contact with and (suspected) exposure to covid-19: Secondary | ICD-10-CM | POA: Diagnosis present

## 2019-09-18 DIAGNOSIS — Z823 Family history of stroke: Secondary | ICD-10-CM

## 2019-09-18 DIAGNOSIS — N400 Enlarged prostate without lower urinary tract symptoms: Secondary | ICD-10-CM | POA: Diagnosis present

## 2019-09-18 DIAGNOSIS — K449 Diaphragmatic hernia without obstruction or gangrene: Secondary | ICD-10-CM | POA: Diagnosis present

## 2019-09-18 DIAGNOSIS — Z91013 Allergy to seafood: Secondary | ICD-10-CM

## 2019-09-18 DIAGNOSIS — Z888 Allergy status to other drugs, medicaments and biological substances status: Secondary | ICD-10-CM | POA: Diagnosis not present

## 2019-09-18 DIAGNOSIS — R32 Unspecified urinary incontinence: Secondary | ICD-10-CM | POA: Diagnosis present

## 2019-09-18 DIAGNOSIS — A419 Sepsis, unspecified organism: Secondary | ICD-10-CM | POA: Diagnosis present

## 2019-09-18 DIAGNOSIS — F419 Anxiety disorder, unspecified: Secondary | ICD-10-CM | POA: Diagnosis present

## 2019-09-18 DIAGNOSIS — K760 Fatty (change of) liver, not elsewhere classified: Secondary | ICD-10-CM | POA: Diagnosis present

## 2019-09-18 LAB — URINALYSIS, COMPLETE (UACMP) WITH MICROSCOPIC
Bacteria, UA: NONE SEEN
Bilirubin Urine: NEGATIVE
Glucose, UA: NEGATIVE mg/dL
Hgb urine dipstick: NEGATIVE
Ketones, ur: NEGATIVE mg/dL
Leukocytes,Ua: NEGATIVE
Nitrite: NEGATIVE
Protein, ur: 30 mg/dL — AB
Specific Gravity, Urine: 1.014 (ref 1.005–1.030)
Squamous Epithelial / HPF: NONE SEEN (ref 0–5)
pH: 6 (ref 5.0–8.0)

## 2019-09-18 LAB — APTT: aPTT: 41 seconds — ABNORMAL HIGH (ref 24–36)

## 2019-09-18 LAB — CBC WITH DIFFERENTIAL/PLATELET
Abs Immature Granulocytes: 0.18 10*3/uL — ABNORMAL HIGH (ref 0.00–0.07)
Basophils Absolute: 0.1 10*3/uL (ref 0.0–0.1)
Basophils Relative: 1 %
Eosinophils Absolute: 0.2 10*3/uL (ref 0.0–0.5)
Eosinophils Relative: 1 %
HCT: 41.9 % (ref 39.0–52.0)
Hemoglobin: 13.8 g/dL (ref 13.0–17.0)
Immature Granulocytes: 1 %
Lymphocytes Relative: 14 %
Lymphs Abs: 2.4 10*3/uL (ref 0.7–4.0)
MCH: 29.9 pg (ref 26.0–34.0)
MCHC: 32.9 g/dL (ref 30.0–36.0)
MCV: 90.7 fL (ref 80.0–100.0)
Monocytes Absolute: 1.3 10*3/uL — ABNORMAL HIGH (ref 0.1–1.0)
Monocytes Relative: 7 %
Neutro Abs: 13.9 10*3/uL — ABNORMAL HIGH (ref 1.7–7.7)
Neutrophils Relative %: 76 %
Platelets: 460 10*3/uL — ABNORMAL HIGH (ref 150–400)
RBC: 4.62 MIL/uL (ref 4.22–5.81)
RDW: 14.5 % (ref 11.5–15.5)
WBC: 18 10*3/uL — ABNORMAL HIGH (ref 4.0–10.5)
nRBC: 0 % (ref 0.0–0.2)

## 2019-09-18 LAB — COMPREHENSIVE METABOLIC PANEL
ALT: 53 U/L — ABNORMAL HIGH (ref 0–44)
AST: 26 U/L (ref 15–41)
Albumin: 3.3 g/dL — ABNORMAL LOW (ref 3.5–5.0)
Alkaline Phosphatase: 98 U/L (ref 38–126)
Anion gap: 10 (ref 5–15)
BUN: 11 mg/dL (ref 8–23)
CO2: 28 mmol/L (ref 22–32)
Calcium: 8.7 mg/dL — ABNORMAL LOW (ref 8.9–10.3)
Chloride: 99 mmol/L (ref 98–111)
Creatinine, Ser: 0.92 mg/dL (ref 0.61–1.24)
GFR calc Af Amer: >60 mL/min (ref >60)
GFR calc non Af Amer: >60 mL/min (ref >60)
Glucose, Bld: 90 mg/dL (ref 70–99)
Potassium: 3.8 mmol/L (ref 3.5–5.1)
Sodium: 137 mmol/L (ref 135–145)
Total Bilirubin: 0.9 mg/dL (ref 0.3–1.2)
Total Protein: 7.8 g/dL (ref 6.5–8.1)

## 2019-09-18 LAB — LACTIC ACID, PLASMA: Lactic Acid, Venous: 0.8 mmol/L (ref 0.5–1.9)

## 2019-09-18 LAB — PROTIME-INR
INR: 1.2 (ref 0.8–1.2)
Prothrombin Time: 14.7 seconds (ref 11.4–15.2)

## 2019-09-18 LAB — POC SARS CORONAVIRUS 2 AG: SARS Coronavirus 2 Ag: NEGATIVE

## 2019-09-18 LAB — RESPIRATORY PANEL BY RT PCR (FLU A&B, COVID)
Influenza A by PCR: NEGATIVE
Influenza B by PCR: NEGATIVE
SARS Coronavirus 2 by RT PCR: NEGATIVE

## 2019-09-18 LAB — SEDIMENTATION RATE: Sed Rate: 73 mm/hr — ABNORMAL HIGH (ref 0–20)

## 2019-09-18 LAB — LIPASE, BLOOD: Lipase: 22 U/L (ref 11–51)

## 2019-09-18 MED ORDER — PANTOPRAZOLE SODIUM 40 MG PO TBEC
40.0000 mg | DELAYED_RELEASE_TABLET | Freq: Every day | ORAL | Status: DC
  Administered 2019-09-19 – 2019-09-22 (×4): 40 mg via ORAL
  Filled 2019-09-18 (×4): qty 1

## 2019-09-18 MED ORDER — TAMSULOSIN HCL 0.4 MG PO CAPS
0.4000 mg | ORAL_CAPSULE | Freq: Every day | ORAL | Status: DC
  Administered 2019-09-19 – 2019-09-21 (×3): 0.4 mg via ORAL
  Filled 2019-09-18 (×3): qty 1

## 2019-09-18 MED ORDER — SODIUM CHLORIDE 0.9 % IV BOLUS
500.0000 mL | Freq: Once | INTRAVENOUS | Status: AC
  Administered 2019-09-18: 500 mL via INTRAVENOUS

## 2019-09-18 MED ORDER — LORAZEPAM 1 MG PO TABS: 1.0000 mg | ORAL_TABLET | ORAL | Status: DC | PRN

## 2019-09-18 MED ORDER — LATANOPROST 0.005 % OP SOLN
1.0000 [drp] | Freq: Every day | OPHTHALMIC | Status: DC
  Administered 2019-09-18 – 2019-09-21 (×4): 1 [drp] via OPHTHALMIC
  Filled 2019-09-18: qty 2.5

## 2019-09-18 MED ORDER — THIAMINE HCL 100 MG PO TABS
100.0000 mg | ORAL_TABLET | Freq: Every day | ORAL | Status: DC
  Administered 2019-09-19 – 2019-09-22 (×4): 100 mg via ORAL
  Filled 2019-09-18 (×4): qty 1

## 2019-09-18 MED ORDER — DIPHENHYDRAMINE HCL 25 MG PO CAPS
50.0000 mg | ORAL_CAPSULE | Freq: Every evening | ORAL | Status: DC | PRN
  Administered 2019-09-19 – 2019-09-21 (×3): 50 mg via ORAL
  Filled 2019-09-18 (×3): qty 2

## 2019-09-18 MED ORDER — ADULT MULTIVITAMIN W/MINERALS CH
1.0000 | ORAL_TABLET | Freq: Every day | ORAL | Status: DC
  Administered 2019-09-19 – 2019-09-22 (×4): 1 via ORAL
  Filled 2019-09-18 (×4): qty 1

## 2019-09-18 MED ORDER — HEPARIN BOLUS VIA INFUSION
5000.0000 [IU] | Freq: Once | INTRAVENOUS | Status: AC
  Administered 2019-09-18: 5000 [IU] via INTRAVENOUS
  Filled 2019-09-18: qty 5000

## 2019-09-18 MED ORDER — IOHEXOL 300 MG/ML  SOLN
100.0000 mL | Freq: Once | INTRAMUSCULAR | Status: AC | PRN
  Administered 2019-09-18: 100 mL via INTRAVENOUS

## 2019-09-18 MED ORDER — TIMOLOL MALEATE 0.5 % OP SOLN
1.0000 [drp] | Freq: Every morning | OPHTHALMIC | Status: DC
  Administered 2019-09-19 – 2019-09-22 (×4): 1 [drp] via OPHTHALMIC
  Filled 2019-09-18: qty 5

## 2019-09-18 MED ORDER — ACETAMINOPHEN 325 MG PO TABS
650.0000 mg | ORAL_TABLET | Freq: Once | ORAL | Status: AC
  Administered 2019-09-18: 650 mg via ORAL
  Filled 2019-09-18: qty 2

## 2019-09-18 MED ORDER — LORATADINE 10 MG PO TABS
10.0000 mg | ORAL_TABLET | Freq: Every day | ORAL | Status: DC
  Administered 2019-09-20 – 2019-09-21 (×2): 10 mg via ORAL
  Filled 2019-09-18 (×2): qty 1

## 2019-09-18 MED ORDER — METRONIDAZOLE IN NACL 5-0.79 MG/ML-% IV SOLN
500.0000 mg | Freq: Three times a day (TID) | INTRAVENOUS | Status: DC
  Administered 2019-09-19 – 2019-09-21 (×6): 500 mg via INTRAVENOUS
  Filled 2019-09-18 (×9): qty 100

## 2019-09-18 MED ORDER — METRONIDAZOLE IN NACL 5-0.79 MG/ML-% IV SOLN
500.0000 mg | Freq: Once | INTRAVENOUS | Status: AC
  Administered 2019-09-18: 500 mg via INTRAVENOUS

## 2019-09-18 MED ORDER — ACETAMINOPHEN 500 MG PO TABS
1000.0000 mg | ORAL_TABLET | Freq: Once | ORAL | Status: AC
  Administered 2019-09-18: 1000 mg via ORAL
  Filled 2019-09-18: qty 2

## 2019-09-18 MED ORDER — LORAZEPAM 2 MG/ML IJ SOLN: 0.0000 mg | Freq: Two times a day (BID) | INTRAMUSCULAR | Status: DC

## 2019-09-18 MED ORDER — FLUTICASONE PROPIONATE 50 MCG/ACT NA SUSP
2.0000 | Freq: Every day | NASAL | Status: DC
  Administered 2019-09-20 – 2019-09-22 (×2): 2 via NASAL
  Filled 2019-09-18 (×2): qty 16

## 2019-09-18 MED ORDER — LEVOCETIRIZINE DIHYDROCHLORIDE 5 MG PO TABS: 5.0000 mg | ORAL_TABLET | Freq: Every evening | ORAL | Status: DC

## 2019-09-18 MED ORDER — CIPROFLOXACIN IN D5W 400 MG/200ML IV SOLN
400.0000 mg | Freq: Two times a day (BID) | INTRAVENOUS | Status: DC
  Administered 2019-09-19 – 2019-09-22 (×7): 400 mg via INTRAVENOUS
  Filled 2019-09-18 (×10): qty 200

## 2019-09-18 MED ORDER — TIZANIDINE HCL 2 MG PO TABS
2.0000 mg | ORAL_TABLET | Freq: Three times a day (TID) | ORAL | Status: DC | PRN
  Filled 2019-09-18: qty 1

## 2019-09-18 MED ORDER — SODIUM CHLORIDE 0.9 % IV SOLN: INTRAVENOUS | Status: AC

## 2019-09-18 MED ORDER — LORAZEPAM 2 MG/ML IJ SOLN: 1.0000 mg | INTRAMUSCULAR | Status: DC | PRN

## 2019-09-18 MED ORDER — LORAZEPAM 2 MG/ML IJ SOLN
0.0000 mg | Freq: Four times a day (QID) | INTRAMUSCULAR | Status: DC
  Administered 2019-09-19: 2 mg via INTRAVENOUS
  Filled 2019-09-18: qty 2

## 2019-09-18 MED ORDER — CIPROFLOXACIN IN D5W 400 MG/200ML IV SOLN
400.0000 mg | Freq: Once | INTRAVENOUS | Status: AC
  Administered 2019-09-18: 400 mg via INTRAVENOUS
  Filled 2019-09-18: qty 200

## 2019-09-18 MED ORDER — THIAMINE HCL 100 MG/ML IJ SOLN
100.0000 mg | Freq: Every day | INTRAMUSCULAR | Status: DC
  Filled 2019-09-18 (×2): qty 2

## 2019-09-18 MED ORDER — HEPARIN (PORCINE) 25000 UT/250ML-% IV SOLN
1800.0000 [IU]/h | INTRAVENOUS | Status: DC
  Administered 2019-09-18: 1200 [IU]/h via INTRAVENOUS
  Administered 2019-09-19: 1400 [IU]/h via INTRAVENOUS
  Administered 2019-09-19: 1700 [IU]/h via INTRAVENOUS
  Filled 2019-09-18 (×5): qty 250

## 2019-09-18 MED ORDER — ACETAMINOPHEN 650 MG RE SUPP: 650.0000 mg | Freq: Four times a day (QID) | RECTAL | Status: DC | PRN

## 2019-09-18 MED ORDER — FOLIC ACID 1 MG PO TABS
1.0000 mg | ORAL_TABLET | Freq: Every day | ORAL | Status: DC
  Administered 2019-09-19 – 2019-09-21 (×3): 1 mg via ORAL
  Filled 2019-09-18 (×4): qty 1

## 2019-09-18 MED ORDER — SODIUM CHLORIDE 0.9% FLUSH: 3.0000 mL | Freq: Once | INTRAVENOUS | Status: DC

## 2019-09-18 MED ORDER — ACETAMINOPHEN 325 MG PO TABS
650.0000 mg | ORAL_TABLET | Freq: Four times a day (QID) | ORAL | Status: DC | PRN
  Administered 2019-09-19: 650 mg via ORAL
  Filled 2019-09-18: qty 2

## 2019-09-18 NOTE — Telephone Encounter (Signed)
Yes, agree with ER eval.  Thanks.

## 2019-09-18 NOTE — Telephone Encounter (Signed)
See phone note.  Pt referred to [facility] ER - currently there.

## 2019-09-18 NOTE — Telephone Encounter (Signed)
Pt calling with fever and not having normal BM; pt [facility]; taking abx. Pt wants to know what to do.since 09/09/19 fever on and off and no normal BM since 09/09/19. Pt said last night temp was 100.6 with abd pain in upper abd and when press on abd causes more pain. Pt had small BM last night and was shiny looking,foul smelling and like a long string. Pt has been having fever,chills, body aches, weakness, taking advil around the clock. H/A on and off,lightheaded;leans to the left but has not fallen,dry cough and SOB, and feeling really tired. No N&V. Pt said labs done on 09/13/18 should WBC 17.5. pt will go to [facility] ED for eval and possible testing, xrays or imaging. FYI to Dr Damita Dunnings who is out of office and Dr Darnell Level who is in office.

## 2019-09-18 NOTE — Telephone Encounter (Signed)
I spoke to patient and he had several issues.  I asked Rena to speak to him.

## 2019-09-18 NOTE — Telephone Encounter (Signed)
Lvm for pt to call back.  Dr. Darnell Level is asking for an update on the pt.

## 2019-09-18 NOTE — ED Notes (Signed)
Pt c/o chills. Temp checked orally, 101.1. Dr Quentin Cornwall notified. Order placed for tylenol 1g.

## 2019-09-18 NOTE — ED Triage Notes (Signed)
Pt comes into the ED via EMS from home with c/o intermittent fever for 10 days. No other sx. States he has been tested for covid and negative twice.

## 2019-09-18 NOTE — ED Provider Notes (Signed)
[facility] Emergency Department Provider Note    First MD Initiated Contact with Patient 09/18/19 1459     (approximate)  I have reviewed the triage vital signs and the nursing notes.   HISTORY  Chief Complaint Fever    HPI Mason [name] is a 74 y.o. male  who presents with several days of even fevers and worsening epigastric pain and nausea.  No back pain, SOB, CP, diarrhea.  Is had a history of diverticulitis but this feels somewhat different.  Denies any dysuria.  No flank pain.  Was started on doxycycline 2 days ago.  Still having nightly fevers.  Has been tested for Covid and was negative.   Past Medical History:  Diagnosis Date  . Allergy   . Anal fissure   . Anxiety   . Cancer ([place])    basal cell removed x4  . Cataract    bil eyes  . Depression   . Diverticulosis of colon   . GERD (gastroesophageal reflux disease)    esophageal narrowing  . Glaucoma   . Hiatal hernia   . IBS (irritable bowel syndrome)   . Insomnia   . Internal hemorrhoids    Family History  Problem Relation Age of Onset  . Hypertension Mother   . Stroke Mother   . Pneumonia Father   . Bladder Cancer Father   . Cancer Maternal Aunt        ? type  . Breast cancer Maternal Aunt        mets  . Esophageal cancer Maternal Aunt        aunt ? esophageal ca  . Stomach cancer Maternal Aunt        aunt ? stmach ca  . Colon cancer Maternal Aunt        had colostomy - not sure of origin  . Cancer Maternal Grandmother   . Prostate cancer Neg Hx   . Rectal cancer Neg Hx    Past Surgical History:  Procedure Laterality Date  . ANAL SPHINCTEROTOMY  03/1995   fistulotomy  . APPENDECTOMY    . BASAL CELL CARCINOMA EXCISION     on back  . BICEPT TENODESIS Right 09/27/2004   decompression  . COLONOSCOPY  04/1999   internal hemmroids,12-11-08 colon tics and hems only   . ESOPHAGOGASTRODUODENOSCOPY  04/1999   with esophagitis and stricture  . MOHS SURGERY  09/02/2008    L supratip of nose with flap,basal cell cancer [facility]/MOHS  . TONSILLECTOMY AND ADENOIDECTOMY     remote  . UPPER GASTROINTESTINAL ENDOSCOPY    . WRIST SURGERY Right    13 years ago  . WRIST SURGERY     scar tissue removed   Patient Active Problem List   Diagnosis Date Noted  . 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 Degenerative disc disease, cervical 01/23/2019  . Skin irritation 10/28/2018  . Neuropathic pain 08/08/2018  . Tongue lesion 07/01/2018  . Mild concussion 05/23/2018  . Health care maintenance 06/25/2017  . Trochanteric bursitis 06/25/2017  . Low HDL (under 40) 05/24/2016  . Advance care planning 05/02/2014  . PSA elevation 05/02/2014  . BPH (benign prostatic hyperplasia) 12/10/2013  . Erectile dysfunction 08/26/2013  . GERD (gastroesophageal reflux disease) 06/28/2013  . Knee pain, left anterior 04/29/2013  . Medicare annual wellness visit, subsequent 11/04/2011  . Depression 04/01/2011  . Insomnia 12/16/2010  . ANXIETY 12/15/2007  . PEPTIC STRICTURE 12/15/2007  . HIATAL HERNIA 12/15/2007  . DIVERTICULOSIS, COLON W/O HEM  12/23/2006  . IRRITABLE BOWEL SYNDROME 12/23/2006      Prior to Admission medications   Medication Sig Start Date End Date Taking? Authorizing Provider  b complex vitamins capsule Take 1 capsule by mouth daily.    [provider]  Bimatoprost (LUMIGAN) 0.01 % SOLN Apply 1 drop to eye daily.      [provider]  cholecalciferol (VITAMIN D) 1000 UNITS tablet Take 1,000 Units by mouth daily.    [provider]  clotrimazole-betamethasone (LOTRISONE) cream Apply 1 application topically 2 (two) times daily as needed. 10/25/18   Tonia Ghent, MD  diphenhydrAMINE (BENADRYL) 50 MG capsule Take 50 mg by mouth at bedtime as needed.    [provider]  esomeprazole (NEXIUM) 40 MG capsule TAKE 1 CAPSULE BY MOUTH TWICE EVERY DAY 07/01/19   Tonia Ghent, MD  fluticasone [facility]) 50 MCG/ACT nasal spray Place 2  sprays into both nostrils daily. 06/28/18   Tonia Ghent, MD  gabapentin (NEURONTIN) 100 MG capsule Take 1 capsule (100 mg total) by mouth 2 (two) times daily. 07/31/19   Lyndal Pulley, DO  gabapentin (NEURONTIN) 300 MG capsule Take 1 capsule (300 mg total) by mouth 2 (two) times daily. 07/30/19   Pieter Partridge, DO  levocetirizine (XYZAL) 5 MG tablet Take 1 tablet (5 mg total) by mouth every evening. 06/28/18   Tonia Ghent, MD  tamsulosin (FLOMAX) 0.4 MG CAPS capsule TAKE 1-2 CAPSULES (0.4- 0.8 MG TOTAL) BY MOUTH DAILY. 07/01/19   Tonia Ghent, MD  timolol (TIMOPTIC-XR) 0.5 % ophthalmic gel-forming Place 1 drop into the left eye every morning. 07/15/19   [provider]  tiZANidine (ZANAFLEX) 2 MG tablet Take 1 tablet (2 mg total) by mouth every 8 (eight) hours as needed for muscle spasms. 07/30/19   Pieter Partridge, DO  vitamin C (ASCORBIC ACID) 500 MG tablet Take 500 mg by mouth daily.    [provider]  vitamin E 400 UNIT capsule Take 400 Units by mouth daily.    [provider]  traZODone (DESYREL) 50 MG tablet 0.5-1 tab at night for insomnia. 12/16/10 11/03/11  Tonia Ghent, MD    Allergies Chlordiazepoxide-clidinium, Citalopram hydrobromide, Erythromycin base, Hyoscyamine sulfate, Meloxicam, Penicillins, and Shrimp [shellfish allergy]    Social History Social History   Tobacco Use  . Smoking status: Former Smoker    Types: Cigarettes, Cigars    Quit date: 08/22/1998    Years since quitting: 21.0  . Smokeless tobacco: Never Used  . Tobacco comment: cigarettes 1968, cigars 2000  Substance Use Topics  . Alcohol use: Yes    Alcohol/week: 0.0 standard drinks    Comment: rare  . Drug use: No    Review of Systems Patient denies headaches, rhinorrhea, blurry vision, numbness, shortness of breath, chest pain, edema, cough, abdominal pain, nausea, vomiting, diarrhea, dysuria, fevers, rashes or hallucinations unless otherwise stated above in  HPI. ____________________________________________   PHYSICAL EXAM:  VITAL SIGNS: Vitals:   09/18/19 1530 09/18/19 1629  BP: (!) 168/91   Pulse: 86   Resp:    Temp:  (!) 101.1 F (38.4 C)  SpO2: 100%     Constitutional: Alert and oriented.  Eyes: Conjunctivae are normal.  Head: Atraumatic. Nose: No congestion/rhinnorhea. Mouth/Throat: Mucous membranes are moist.   Neck: No stridor. Painless ROM.  Cardiovascular: Normal rate, regular rhythm. Grossly normal heart sounds.  Good peripheral circulation. Respiratory: Normal respiratory effort.  No retractions. Lungs CTAB. Gastrointestinal: Soft and  nontender. No distention. No abdominal bruits. No CVA tenderness. Genitourinary:  Musculoskeletal: No lower extremity tenderness nor edema.  No joint effusions. Neurologic:  Normal speech and language. No gross focal neurologic deficits are appreciated. No facial droop Skin:  Skin is warm, dry and intact. No rash noted. Psychiatric: Mood and affect are normal. Speech and behavior are normal.  ____________________________________________   LABS (all labs ordered are listed, but only abnormal results are displayed)  Results for orders placed or performed during the hospital encounter of 09/18/19 (from the past 24 hour(s))  Lactic acid, plasma     Status: None   Collection Time: 09/18/19  1:04 PM  Result Value Ref Range   Lactic Acid, Venous 0.8 0.5 - 1.9 mmol/L  Comprehensive metabolic panel     Status: Abnormal   Collection Time: 09/18/19  1:04 PM  Result Value Ref Range   Sodium 137 135 - 145 mmol/L   Potassium 3.8 3.5 - 5.1 mmol/L   Chloride 99 98 - 111 mmol/L   CO2 28 22 - 32 mmol/L   Glucose, Bld 90 70 - 99 mg/dL   BUN 11 8 - 23 mg/dL   Creatinine, Ser 0.92 0.61 - 1.24 mg/dL   Calcium 8.7 (L) 8.9 - 10.3 mg/dL   Total Protein 7.8 6.5 - 8.1 g/dL   Albumin 3.3 (L) 3.5 - 5.0 g/dL   AST 26 15 - 41 U/L   ALT 53 (H) 0 - 44 U/L   Alkaline Phosphatase 98 38 - 126 U/L   Total  Bilirubin 0.9 0.3 - 1.2 mg/dL   GFR calc non Af Amer >60 >60 mL/min   GFR calc Af Amer >60 >60 mL/min   Anion gap 10 5 - 15  CBC with Differential     Status: Abnormal   Collection Time: 09/18/19  1:04 PM  Result Value Ref Range   WBC 18.0 (H) 4.0 - 10.5 K/uL   RBC 4.62 4.22 - 5.81 MIL/uL   Hemoglobin 13.8 13.0 - 17.0 g/dL   HCT 41.9 39.0 - 52.0 %   MCV 90.7 80.0 - 100.0 fL   MCH 29.9 26.0 - 34.0 pg   MCHC 32.9 30.0 - 36.0 g/dL   RDW 14.5 11.5 - 15.5 %   Platelets 460 (H) 150 - 400 K/uL   nRBC 0.0 0.0 - 0.2 %   Neutrophils Relative % 76 %   Neutro Abs 13.9 (H) 1.7 - 7.7 K/uL   Lymphocytes Relative 14 %   Lymphs Abs 2.4 0.7 - 4.0 K/uL   Monocytes Relative 7 %   Monocytes Absolute 1.3 (H) 0.1 - 1.0 K/uL   Eosinophils Relative 1 %   Eosinophils Absolute 0.2 0.0 - 0.5 K/uL   Basophils Relative 1 %   Basophils Absolute 0.1 0.0 - 0.1 K/uL   Immature Granulocytes 1 %   Abs Immature Granulocytes 0.18 (H) 0.00 - 0.07 K/uL  Urinalysis, Complete w Microscopic     Status: Abnormal   Collection Time: 09/18/19  1:05 PM  Result Value Ref Range   Color, Urine YELLOW (A) YELLOW   APPearance CLEAR (A) CLEAR   Specific Gravity, Urine 1.014 1.005 - 1.030   pH 6.0 5.0 - 8.0   Glucose, UA NEGATIVE NEGATIVE mg/dL   Hgb urine dipstick NEGATIVE NEGATIVE   Bilirubin Urine NEGATIVE NEGATIVE   Ketones, ur NEGATIVE NEGATIVE mg/dL   Protein, ur 30 (A) NEGATIVE mg/dL   Nitrite NEGATIVE NEGATIVE   Leukocytes,Ua NEGATIVE NEGATIVE   RBC /  HPF 0-5 0 - 5 RBC/hpf   WBC, UA 0-5 0 - 5 WBC/hpf   Bacteria, UA NONE SEEN NONE SEEN   Squamous Epithelial / LPF NONE SEEN 0 - 5  POC SARS Coronavirus 2 Ag     Status: None   Collection Time: 09/18/19  3:56 PM  Result Value Ref Range   SARS Coronavirus 2 Ag NEGATIVE NEGATIVE   ____________________________________________ ____________________________________________  RADIOLOGY  I personally reviewed all radiographic images ordered to evaluate for the above  acute complaints and reviewed radiology reports and findings.  These findings were personally discussed with the patient.  Please see medical record for radiology report.  ____________________________________________   PROCEDURES  Procedure(s) performed:  Procedures    Critical Care performed: no ____________________________________________   INITIAL IMPRESSION / ASSESSMENT AND PLAN / ED COURSE  Pertinent labs & imaging results that were available during my care of the patient were reviewed by me and considered in my medical decision making (see chart for details).   DDX: sepsis, pna, covid, enteritis, pancreatitis, cholelithiasis, cholecystitis,   Mason [name] is a 74 y.o. who presents to the ED with epigastric discomfort and persistent fevers as described above.  Clinically he appears nontoxic.  Does have some mild epigastric pain.  Does have leukocytosis.  He is already been on antibiotics therefore CT imaging will be ordered.  Will provide IV fluids.  The patient will be placed on continuous pulse oximetry and telemetry for monitoring.  Laboratory evaluation will be sent to evaluate for the above complaints.     Clinical Course as of Sep 17 1757  Wed Sep 18, 2019  1756 Patient with evidence of diverticulitis by CT.  Also with evidence of portal vein thrombosis.  Having persistent fevers.  Failing treatment with oral antibiotic.  Will discuss with hospitalist for admission for IV antibiotics.  Will heparinize.  Have discussed with the patient and available family all diagnostics and treatments performed thus far and all questions were answered to the best of my ability. The patient demonstrates understanding and agreement with plan.    [PR]    Clinical Course User Index [PR] Merlyn Lot, MD    The patient was evaluated in Emergency Department today for the symptoms described in the history of present illness. He/she was evaluated in the context of the global COVID-19  pandemic, which necessitated consideration that the patient might be at risk for infection with the SARS-CoV-2 virus that causes COVID-19. Institutional protocols and algorithms that pertain to the evaluation of patients at risk for COVID-19 are in a state of rapid change based on information released by regulatory bodies including the CDC and federal and state organizations. These policies and algorithms were followed during the patient's care in the ED.  As part of my medical decision making, I reviewed the following data within the [facility] notes reviewed and incorporated, Labs reviewed, notes from prior ED visits and [place] Controlled Substance Database   ____________________________________________   FINAL CLINICAL IMPRESSION(S) / ED DIAGNOSES  Final diagnoses:  Acute diverticulitis of intestine  Portal vein thrombosis      NEW MEDICATIONS STARTED DURING THIS VISIT:  New Prescriptions   No medications on file     Note:  This document was prepared using Dragon voice recognition software and may include unintentional dictation errors.    Merlyn Lot, MD 09/18/19 1759

## 2019-09-18 NOTE — Telephone Encounter (Signed)
Pt called stating he isn't any better. Should he go to resp clinic?

## 2019-09-18 NOTE — Consult Note (Signed)
Pharmacy Antibiotic Note  Mason [name] is a 74 y.o. male admitted on 09/18/2019 with Intra-abdominal infection.  Pharmacy has been consulted for ciprofloxacin dosing.  Plan: Ciprofloxacin 400 mg BID IV   Height: 5\' 9"  (175.3 cm) Weight: 175 lb (79.4 kg) IBW/kg (Calculated) : 70.7  Temp (24hrs), Avg:100.6 F (38.1 C), Min:98.5 F (36.9 C), Max:103 F (39.4 C)  Recent Labs  Lab 09/18/19 1304  WBC 18.0*  CREATININE 0.92  LATICACIDVEN 0.8    Estimated Creatinine Clearance: 69.4 mL/min (by C-G formula based on SCr of 0.92 mg/dL).    Allergies  Allergen Reactions  . Chlordiazepoxide-Clidinium     REACTION: urinary retention  . Citalopram Hydrobromide     Intolerant, tried 03/2011.  "It made me very angry".  . Erythromycin Base     REACTION: stomach upset  . Hyoscyamine Sulfate     REACTION: urinary retention  . Meloxicam     GI upset  . Penicillins     REACTION: Itching, rash  . Shrimp [Shellfish Allergy]     And crab- rash, GI upset, upper airway symptoms    Antimicrobials this admission: 1/27 ciprofloxacin >>  1/27 flagyl >>   Dose adjustments this admission: None  Microbiology results: 1/27 BCx: pending  Thank you for allowing pharmacy to be a part of this patient's care.  Oswald Hillock, PharmD, BCPS 09/18/2019 7:55 PM

## 2019-09-18 NOTE — Telephone Encounter (Signed)
Noted. Thanks.

## 2019-09-18 NOTE — Telephone Encounter (Signed)
[facility] Night - Client Nonclinical Telephone Record Access[facility]urse Client [place] Night - Client Client Site [facility] Physician Renford Dills - MD Contact Type Call Who Is Calling Patient / Member / Family / Caregiver Caller Name [place] Phone Number [phone] Patient Name Mason [name] Patient DOB 07-16-1946 Call Type Message Only Information Provided Reason for Call Request to Schedule Office Appointment Initial Comment Caller is asking for a appt for asap. Still feeling bad. Declined triage. Additional Comment Disp. Time Disposition Final User 09/18/2019 7:42:46 AM General Information Provided Yes Silvano Rusk Call Closed By: Silvano Rusk Transaction Date/Time: 09/18/2019 7:39:40 AM (ET)

## 2019-09-18 NOTE — ED Notes (Signed)
Temp rechecked orally, 103.  Dr. Maudie Mercury at bedside, notified. Verbal for 650mg  tylenol

## 2019-09-18 NOTE — H&P (Signed)
TRH H&P    Patient Demographics:    Mason [name], is a 74 y.o. male  MRN: [id number]  DOB - March 23, 1946  Admit Date - 09/18/2019  Referring MD/NP/PA:  Willy Eddy  Outpatient Primary MD for the patient is Joaquim Nam, MD [facility]  Patient coming from: home  Chief complaint-  Abdominal pain   HPI:    Mason [name]  is a 74 y.o. male,  w anxiety/ depression , gerd, diverticulosis, apparently presents with upper abdominal pain x 2 days.  Pt has had subjective fever for the past 10days, covid negative outpatient testing, and tx with doxycycline x 2 days.   Denies n/v, diarrhea, brbpr, black stool, cough, cp, palp, sob.  Pt presented due to abdominal pain.   In ED,  T 98.5, P 80 R 16, Bp 173/95  Pox 99% on RA Wt 79.4kg  CT abd/ pelvis IMPRESSION: Extensive diverticulosis in the sigmoid region. Acute diverticulitis at the descending sigmoid junction. No evidence of abscess or peritonitis. 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 Geographic fatty change of the liver. Heterogeneous appearance of the left lobe probably relates to geographic fatty change in possibly flow disturbance because of the localized portal vein thrombosis. Portal vein thrombosis can occur in the setting of acute inflammatory processes.  Na 137, K 3.8,  Bun 11, Creatinine  0.92 Ast 26, Alt 53 Wbc 18.0, Hgb 13.8, Plt 460 Lipase 22 Urinalysis prot 30  Blood culture x2   poc SARS negative  ED spoke with GI ([facility]) and they will be by in AM per ED.   Pt will be admitted for abdominal pain, secondary to portal vein thrombosis, and fever / diverticulitis       Review of systems:    In addition to the HPI above,    No Headache, No changes with Vision or hearing, No problems swallowing food or Liquids, No Chest pain,  Cough or Shortness of Breath,  , bowel movements are regular, No Blood in stool or Urine, No dysuria, No new skin rashes or bruises, No new joints pains-aches,  No new weakness, tingling, numbness in any extremity, No recent weight gain or loss, No polyuria, polydypsia or polyphagia, No significant Mental Stressors.  All other systems reviewed and are negative.    Past History of the following :    Past Medical History:  Diagnosis Date  . Allergy   . Anal fissure   . Anxiety   . Cancer (HCC)    basal cell removed x4  . Cataract    bil eyes  . Depression   . Diverticulosis of colon   . GERD (gastroesophageal reflux disease)    esophageal narrowing  . Glaucoma   . Hiatal hernia   . IBS (irritable bowel syndrome)   . Insomnia   . Internal hemorrhoids       Past Surgical History:  Procedure Laterality Date  . ANAL SPHINCTEROTOMY  03/1995   fistulotomy  .  APPENDECTOMY    . BASAL CELL CARCINOMA EXCISION     on back  . BICEPT TENODESIS Right 09/27/2004   decompression  . COLONOSCOPY  04/1999   internal hemmroids,12-11-08 colon tics and hems only   . ESOPHAGOGASTRODUODENOSCOPY  04/1999   with esophagitis and stricture  . MOHS SURGERY  09/02/2008   L supratip of nose with flap,basal cell cancer [facility]/MOHS  . TONSILLECTOMY AND ADENOIDECTOMY     remote  . UPPER GASTROINTESTINAL ENDOSCOPY    . WRIST SURGERY Right    13 years ago  . WRIST SURGERY     scar tissue removed      Social History:      Social History   Tobacco Use  . Smoking status: Former Smoker    Types: Cigarettes, Cigars    Quit date: 08/22/1998    Years since quitting: 21.0  . Smokeless tobacco: Never Used  . Tobacco comment: cigarettes 1968, cigars 2000  Substance Use Topics  . Alcohol use: Yes    Alcohol/week: 0.0 standard drinks    Comment: rare       Family History :     Family History  Problem Relation Age of Onset  . Hypertension Mother   . Stroke Mother   . Pneumonia Father    . Bladder Cancer Father   . Cancer Maternal Aunt        ? type  . Breast cancer Maternal Aunt        mets  . Esophageal cancer Maternal Aunt        aunt ? esophageal ca  . Stomach cancer Maternal Aunt        aunt ? stmach ca  . Colon cancer Maternal Aunt        had colostomy - not sure of origin  . Cancer Maternal Grandmother   . Prostate cancer Neg Hx   . Rectal cancer Neg Hx        Home Medications:   Prior to Admission medications   Medication Sig Start Date End Date Taking? Authorizing Provider  b complex vitamins capsule Take 1 capsule by mouth daily.   Yes [provider]  Bimatoprost (LUMIGAN) 0.01 % SOLN Apply 1 drop to eye daily.     Yes [provider]  cholecalciferol (VITAMIN D) 1000 UNITS tablet Take 1,000 Units by mouth daily.   Yes [provider]  esomeprazole (NEXIUM) 40 MG capsule TAKE 1 CAPSULE BY MOUTH TWICE EVERY DAY Patient taking differently: Take 40 mg by mouth daily. TAKE 1 CAPSULE BY MOUTH TWICE EVERY DAY 07/01/19  Yes Joaquim Nam, MD  fluticasone [facility]) 50 MCG/ACT nasal spray Place 2 sprays into both nostrils daily. 06/28/18  Yes Joaquim Nam, MD  gabapentin (NEURONTIN) 100 MG capsule Take 1 capsule (100 mg total) by mouth 2 (two) times daily. Patient taking differently: Take 100 mg by mouth 2 (two) times daily as needed.  07/31/19  Yes Judi Saa, DO  gabapentin (NEURONTIN) 300 MG capsule Take 1 capsule (300 mg total) by mouth 2 (two) times daily. Patient taking differently: Take 300 mg by mouth 2 (two) times daily as needed.  07/30/19  Yes Jaffe, Adam R, DO  levocetirizine (XYZAL) 5 MG tablet Take 1 tablet (5 mg total) by mouth every evening. Patient taking differently: Take 5 mg by mouth daily as needed.  06/28/18  Yes Joaquim Nam, MD  tamsulosin (FLOMAX) 0.4 MG CAPS capsule TAKE 1-2 CAPSULES (0.4- 0.8 MG TOTAL) BY MOUTH  DAILY. 07/01/19  Yes Joaquim Nam, MD  timolol (TIMOPTIC-XR) 0.5 % ophthalmic  gel-forming Place 1 drop into the left eye every morning. 07/15/19  Yes [provider]  vitamin C (ASCORBIC ACID) 500 MG tablet Take 500 mg by mouth daily.   Yes [provider]  tiZANidine (ZANAFLEX) 2 MG tablet Take 1 tablet (2 mg total) by mouth every 8 (eight) hours as needed for muscle spasms. 07/30/19   Drema Dallas, DO  traZODone (DESYREL) 50 MG tablet 0.5-1 tab at night for insomnia. 12/16/10 11/03/11  Joaquim Nam, MD     Allergies:     Allergies  Allergen Reactions  . Chlordiazepoxide-Clidinium     REACTION: urinary retention  . Citalopram Hydrobromide     Intolerant, tried 03/2011.  "It made me very angry".  . Erythromycin Base     REACTION: stomach upset  . Hyoscyamine Sulfate     REACTION: urinary retention  . Meloxicam     GI upset  . Penicillins     REACTION: Itching, rash  . Shrimp [Shellfish Allergy]     And crab- rash, GI upset, upper airway symptoms     Physical Exam:   Vitals  Blood pressure (!) 168/91, pulse 86, temperature 99.6 F (37.6 C), temperature source Oral, resp. rate 16, height 5\' 9"  (1.753 m), weight 79.4 kg, SpO2 100 %.  1.  General: axoxo3  2. Psychiatric: euthymic  3. Neurologic: nonfocal  4. HEENMT:  Anicteric, pupils 1.69mm symmetric, direct, consensual intact Neck: no jvd  5. Respiratory : CTAB  6. Cardiovascular : rrr s1, s2, no m/g/r  7. Gastrointestinal:  ABd: soft, nt, nd, +bs  8. Skin:  Ext: no c/c/e, no rash   9.Musculoskeletal:  Good ROM    Data Review:    CBC Recent Labs  Lab 09/18/19 1304  WBC 18.0*  HGB 13.8  HCT 41.9  PLT 460*  MCV 90.7  MCH 29.9  MCHC 32.9  RDW 14.5  LYMPHSABS 2.4  MONOABS 1.3*  EOSABS 0.2  BASOSABS 0.1   ------------------------------------------------------------------------------------------------------------------  Results for orders placed or performed during the hospital encounter of 09/18/19 (from the past 48 hour(s))  Lactic acid, plasma      Status: None   Collection Time: 09/18/19  1:04 PM  Result Value Ref Range   Lactic Acid, Venous 0.8 0.5 - 1.9 mmol/L    Comment: Performed at [facility], [address] [place], [place] [address]  Comprehensive metabolic panel     Status: Abnormal   Collection Time: 09/18/19  1:04 PM  Result Value Ref Range   Sodium 137 135 - 145 mmol/L   Potassium 3.8 3.5 - 5.1 mmol/L   Chloride 99 98 - 111 mmol/L   CO2 28 22 - 32 mmol/L   Glucose, Bld 90 70 - 99 mg/dL   BUN 11 8 - 23 mg/dL   Creatinine, Ser 8.29 0.61 - 1.24 mg/dL   Calcium 8.7 (L) 8.9 - 10.3 mg/dL   Total Protein 7.8 6.5 - 8.1 g/dL   Albumin 3.3 (L) 3.5 - 5.0 g/dL   AST 26 15 - 41 U/L   ALT 53 (H) 0 - 44 U/L   Alkaline Phosphatase 98 38 - 126 U/L   Total Bilirubin 0.9 0.3 - 1.2 mg/dL   GFR calc non Af Amer >60 >60 mL/min   GFR calc Af Amer >60 >60 mL/min   Anion gap 10 5 - 15    Comment: Performed at [facility], [address] [place], [place] [address]  CBC with Differential     Status: Abnormal   Collection Time: 09/18/19  1:04 PM  Result Value Ref Range   WBC 18.0 (H) 4.0 - 10.5 K/uL   RBC 4.62 4.22 - 5.81 MIL/uL   Hemoglobin 13.8 13.0 - 17.0 g/dL   HCT 60.4 54.0 - 98.1 %   MCV 90.7 80.0 - 100.0 fL   MCH 29.9 26.0 - 34.0 pg   MCHC 32.9 30.0 - 36.0 g/dL   RDW 19.1 47.8 - 29.5 %   Platelets 460 (H) 150 - 400 K/uL   nRBC 0.0 0.0 - 0.2 %   Neutrophils Relative % 76 %   Neutro Abs 13.9 (H) 1.7 - 7.7 K/uL   Lymphocytes Relative 14 %   Lymphs Abs 2.4 0.7 - 4.0 K/uL   Monocytes Relative 7 %   Monocytes Absolute 1.3 (H) 0.1 - 1.0 K/uL   Eosinophils Relative 1 %   Eosinophils Absolute 0.2 0.0 - 0.5 K/uL   Basophils Relative 1 %   Basophils Absolute 0.1 0.0 - 0.1 K/uL   Immature Granulocytes 1 %   Abs Immature Granulocytes 0.18 (H) 0.00 - 0.07 K/uL    Comment: Performed at [facility], [address] [place], [place] [address]  Lipase, blood     Status: None   Collection Time:  09/18/19  1:04 PM  Result Value Ref Range   Lipase 22 11 - 51 U/L    Comment: Performed at [facility], [address] [place], [place] [address]  Urinalysis, Complete w Microscopic     Status: Abnormal   Collection Time: 09/18/19  1:05 PM  Result Value Ref Range   Color, Urine YELLOW (A) YELLOW   APPearance CLEAR (A) CLEAR   Specific Gravity, Urine 1.014 1.005 - 1.030   pH 6.0 5.0 - 8.0   Glucose, UA NEGATIVE NEGATIVE mg/dL   Hgb urine dipstick NEGATIVE NEGATIVE   Bilirubin Urine NEGATIVE NEGATIVE   Ketones, ur NEGATIVE NEGATIVE mg/dL   Protein, ur 30 (A) NEGATIVE mg/dL   Nitrite NEGATIVE NEGATIVE   Leukocytes,Ua NEGATIVE NEGATIVE   RBC / HPF 0-5 0 - 5 RBC/hpf   WBC, UA 0-5 0 - 5 WBC/hpf   Bacteria, UA NONE SEEN NONE SEEN   Squamous Epithelial / LPF NONE SEEN 0 - 5    Comment: Performed at [facility], [address] [place], [place] [address]  POC SARS Coronavirus 2 Ag     Status: None   Collection Time: 09/18/19  3:56 PM  Result Value Ref Range   SARS Coronavirus 2 Ag NEGATIVE NEGATIVE    Comment: (NOTE) SARS-CoV-2 antigen NOT DETECTED.  Negative results are presumptive.  Negative results do not preclude SARS-CoV-2 infection and should not be used as the sole basis for treatment or other patient management decisions, including infection  control decisions, particularly in the presence of clinical signs and  symptoms consistent with COVID-19, or in those who have been in contact with the virus.  Negative results must be combined with clinical observations, patient history, and epidemiological information. The expected result is Negative. Fact Sheet for Patients: https://sanders-williams.net/ Fact Sheet for Healthcare Providers: https://martinez.com/ This test is not yet approved or cleared by the [place] FDA and  has been authorized for detection and/or diagnosis of SARS-CoV-2 by FDA under an Emergency Use  Authorization (EUA).  This EUA will remain in effect (meaning this test can be used)  for the duration of  the COVID-19 de claration under Section 564(b)(1) of the Act, 21 U.S.C. section 360bbb-3(b)(1), unless the authorization is terminated or revoked sooner.   APTT     Status: Abnormal   Collection Time: 09/18/19  6:26 PM  Result Value Ref Range   aPTT 41 (H) 24 - 36 seconds    Comment:        IF BASELINE aPTT IS ELEVATED, SUGGEST PATIENT RISK ASSESSMENT BE USED TO DETERMINE APPROPRIATE ANTICOAGULANT THERAPY. Performed at [facility], [address] [place], [place] [address]   Protime-INR     Status: None   Collection Time: 09/18/19  6:26 PM  Result Value Ref Range   Prothrombin Time 14.7 11.4 - 15.2 seconds   INR 1.2 0.8 - 1.2    Comment: (NOTE) INR goal varies based on device and disease states. Performed at [facility], [address] [place], [place] [address]   ESR     Status: Abnormal   Collection Time: 09/18/19  6:26 PM  Result Value Ref Range   Sed Rate 73 (H) 0 - 20 mm/hr    Comment: Performed at [facility], [address] [place], [place] [address]    Chemistries  Recent Labs  Lab 09/18/19 1304  NA 137  K 3.8  CL 99  CO2 28  GLUCOSE 90  BUN 11  CREATININE 0.92  CALCIUM 8.7*  AST 26  ALT 53*  ALKPHOS 98  BILITOT 0.9   ------------------------------------------------------------------------------------------------------------------  ------------------------------------------------------------------------------------------------------------------ GFR: Estimated Creatinine Clearance: 69.4 mL/min (by C-G formula based on SCr of 0.92 mg/dL). Liver Function Tests: Recent Labs  Lab 09/18/19 1304  AST 26  ALT 53*  ALKPHOS 98  BILITOT 0.9  PROT 7.8  ALBUMIN 3.3*   Recent Labs  Lab 09/18/19 1304  LIPASE 22   No results for input(s): AMMONIA in the last 168 hours. Coagulation Profile: Recent Labs  Lab  09/18/19 1826  INR 1.2   Cardiac Enzymes: No results for input(s): CKTOTAL, CKMB, CKMBINDEX, TROPONINI in the last 168 hours. BNP (last 3 results) No results for input(s): PROBNP in the last 8760 hours. HbA1C: No results for input(s): HGBA1C in the last 72 hours. CBG: No results for input(s): GLUCAP in the last 168 hours. Lipid Profile: No results for input(s): CHOL, HDL, LDLCALC, TRIG, CHOLHDL, LDLDIRECT in the last 72 hours. Thyroid Function Tests: No results for input(s): TSH, T4TOTAL, FREET4, T3FREE, THYROIDAB in the last 72 hours. Anemia Panel: No results for input(s): VITAMINB12, FOLATE, FERRITIN, TIBC, IRON, RETICCTPCT in the last 72 hours.  --------------------------------------------------------------------------------------------------------------- Urine analysis:    Component Value Date/Time   COLORURINE YELLOW (A) 09/18/2019 1305   APPEARANCEUR CLEAR (A) 09/18/2019 1305   LABSPEC 1.014 09/18/2019 1305   PHURINE 6.0 09/18/2019 1305   GLUCOSEU NEGATIVE 09/18/2019 1305   GLUCOSEU NEGATIVE 01/23/2007 1024   HGBUR NEGATIVE 09/18/2019 1305   HGBUR negative 01/01/2009 0952   BILIRUBINUR NEGATIVE 09/18/2019 1305   KETONESUR NEGATIVE 09/18/2019 1305   PROTEINUR 30 (A) 09/18/2019 1305   UROBILINOGEN 0.2 01/01/2009 0952   NITRITE NEGATIVE 09/18/2019 1305   LEUKOCYTESUR NEGATIVE 09/18/2019 1305      Imaging Results:    DG Chest 2 View  Result Date: 09/18/2019 CLINICAL DATA:  Unexplained fever 10 days. EXAM: CHEST - 2 VIEW COMPARISON:  08/03/2018 FINDINGS: Lungs are adequately inflated and otherwise clear. Cardiomediastinal silhouette and remainder of the exam is unchanged. IMPRESSION: No active cardiopulmonary disease. Electronically Signed   By: Reuel Boom  Micheline Maze M.D.   On: 09/18/2019 13:14   CT ABDOMEN PELVIS W CONTRAST  Result Date: 09/18/2019 CLINICAL DATA:  Fever for the last 10 days. History of diverticulosis. Suspected abdominal infection. EXAM: CT ABDOMEN AND  PELVIS WITH CONTRAST TECHNIQUE: Multidetector CT imaging of the abdomen and pelvis was performed using the standard protocol following bolus administration of intravenous contrast. CONTRAST:  OMNIPAQUE IOHEXOL 300 MG/ML  SOLN COMPARISON:  01/24/2007 FINDINGS: Lower chest: Normal Hepatobiliary: Chronic 1 cm cyst of the left lobe. Chronic 1 cm cyst at the ventral right lobe. Previously seen 1 cm cyst in the medial segment of the left lobe no longer visible. Geographic fatty change of the liver. More inhomogeneous fatty change of the left lobe. I think findings less likely represent liver infection or infiltrating tumor. There appears to be localized thrombosis of the left division of the portal vein. Pancreas: Normal Spleen: Normal Adrenals/Urinary Tract: Adrenal glands are normal. Kidneys are normal. Bladder is normal. Stomach/Bowel: No sign of bowel obstruction. Advanced diverticulosis of the sigmoid colon. Large diverticulum containing inspissated stool. Low level acute diverticulitis at the descending sigmoid junction. The patient has a giant diverticulum distal to that with a partially calcified stool ball. Vascular/Lymphatic: Aortic atherosclerosis. IVC is normal. No retroperitoneal adenopathy. Reproductive: Enlarged prostate. Other: No free fluid or air. Musculoskeletal: Lower lumbar chronic degenerative changes. IMPRESSION: Extensive diverticulosis in the sigmoid region. Acute diverticulitis at the descending sigmoid junction. No evidence of abscess or peritonitis. 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 Geographic fatty change of the liver. Heterogeneous appearance of the left lobe probably relates to geographic fatty change in possibly flow disturbance because of the localized portal vein thrombosis. Portal vein thrombosis can occur in the setting of acute inflammatory processes. Electronically Signed   By: Paulina Fusi M.D.   On: 09/18/2019 17:40       Assessment & Plan:    Principal Problem:   Diverticulitis Active Problems:   Portal vein thrombosis    Portal vein thrombosis Heparin GTT GI consult per ED, may want to touch base in am to verify   Sepsis (fever, tachycardia, elevated wbc) Diverticulitis Blood culture x2 Cipro iv pharmacy to dose, Flagyl 500mg  iv tid  Abnormal liver function Check acute hepatitis panel Check cmp in am  ETOH dep Librium 50mg  po qday x1 days then 25mg  po qday x 1 day CIWA  Gerd Cont PPI  Glaucoma Cont Timolol Cont Lumigan-> Xalatan  Bph Cont Flomax    DVT Prophylaxis-   Heparin - SCDs   AM Labs Ordered, also please review Full Orders  Family Communication: Admission, patients condition and plan of care including tests being ordered have been discussed with the patient  who indicate understanding and agree with the plan and Code Status.  Code Status:  FULL CODE per patient   Admission status:   Inpatient: Based on patients clinical presentation and evaluation of above clinical data, I have made determination that patient meets Inpatient criteria at this time.   Pt will require iv heparin for hepatic vein thrombosis and iv abx for diverticulitis.  Pt has sepsis and has high risk of clinical deterioration, pt will require > 2 nites stay.   Time spent in minutes : 70   Pearson Grippe M.D on 09/18/2019 at 7:40 PM

## 2019-09-18 NOTE — Telephone Encounter (Signed)
Thank you - agree with ER evaluation - may need GI imaging.  Not seen yet.  plz call this afternoon for update

## 2019-09-18 NOTE — Progress Notes (Signed)
ANTICOAGULATION CONSULT NOTE - Initial Consult  Pharmacy Consult for Heparin  Indication: DVT  Allergies  Allergen Reactions  . Chlordiazepoxide-Clidinium     REACTION: urinary retention  . Citalopram Hydrobromide     Intolerant, tried 03/2011.  "It made me very angry".  . Erythromycin Base     REACTION: stomach upset  . Hyoscyamine Sulfate     REACTION: urinary retention  . Meloxicam     GI upset  . Penicillins     REACTION: Itching, rash  . Shrimp [Shellfish Allergy]     And crab- rash, GI upset, upper airway symptoms    Patient Measurements: Height: 5\' 9"  (175.3 cm) Weight: 175 lb (79.4 kg) IBW/kg (Calculated) : 70.7 Heparin Dosing Weight:   Vital Signs: Temp: 103 F (39.4 C) (01/27 1814) Temp Source: Oral (01/27 1814) BP: 168/91 (01/27 1530) Pulse Rate: 86 (01/27 1530)  Labs: Recent Labs    09/18/19 1304  HGB 13.8  HCT 41.9  PLT 460*  CREATININE 0.92    Estimated Creatinine Clearance: 69.4 mL/min (by C-G formula based on SCr of 0.92 mg/dL).   Medical History: Past Medical History:  Diagnosis Date  . Allergy   . Anal fissure   . Anxiety   . Cancer ([facility])    basal cell removed x4  . Cataract    bil eyes  . Depression   . Diverticulosis of colon   . GERD (gastroesophageal reflux disease)    esophageal narrowing  . Glaucoma   . Hiatal hernia   . IBS (irritable bowel syndrome)   . Insomnia   . Internal hemorrhoids     Medications:  (Not in a hospital admission)   Assessment: Pharmacy consulted to dose heparin in this 74 year old male admitted with DVT.  No prior anticoag noted.  CrCl = 69.4 ml/min  Goal of Therapy:  Heparin level 0.3-0.7 units/ml Monitor platelets by anticoagulation protocol: Yes   Plan:  Give 5000 units bolus x 1 Start heparin infusion at 1200 units/hr Check anti-Xa level in 8 hours and daily while on heparin Continue to monitor H&H and platelets  Taraya Steward D 09/18/2019,6:18 PM

## 2019-09-19 DIAGNOSIS — K5792 Diverticulitis of intestine, part unspecified, without perforation or abscess without bleeding: Secondary | ICD-10-CM

## 2019-09-19 DIAGNOSIS — I81 Portal vein thrombosis: Secondary | ICD-10-CM

## 2019-09-19 DIAGNOSIS — R509 Fever, unspecified: Secondary | ICD-10-CM | POA: Insufficient documentation

## 2019-09-19 LAB — COMPREHENSIVE METABOLIC PANEL
ALT: 42 U/L (ref 0–44)
AST: 26 U/L (ref 15–41)
Albumin: 2.4 g/dL — ABNORMAL LOW (ref 3.5–5.0)
Alkaline Phosphatase: 78 U/L (ref 38–126)
Anion gap: 13 (ref 5–15)
BUN: 11 mg/dL (ref 8–23)
CO2: 22 mmol/L (ref 22–32)
Calcium: 8.2 mg/dL — ABNORMAL LOW (ref 8.9–10.3)
Chloride: 101 mmol/L (ref 98–111)
Creatinine, Ser: 0.96 mg/dL (ref 0.61–1.24)
GFR calc Af Amer: >60 mL/min (ref >60)
GFR calc non Af Amer: >60 mL/min (ref >60)
Glucose, Bld: 76 mg/dL (ref 70–99)
Potassium: 4 mmol/L (ref 3.5–5.1)
Sodium: 136 mmol/L (ref 135–145)
Total Bilirubin: 0.9 mg/dL (ref 0.3–1.2)
Total Protein: 6.1 g/dL — ABNORMAL LOW (ref 6.5–8.1)

## 2019-09-19 LAB — HEPARIN LEVEL (UNFRACTIONATED)
Heparin Unfractionated: 0.11 IU/mL — ABNORMAL LOW (ref 0.30–0.70)
Heparin Unfractionated: 0.13 IU/mL — ABNORMAL LOW (ref 0.30–0.70)

## 2019-09-19 LAB — CBC
HCT: 36.1 % — ABNORMAL LOW (ref 39.0–52.0)
Hemoglobin: 12.2 g/dL — ABNORMAL LOW (ref 13.0–17.0)
MCH: 30.4 pg (ref 26.0–34.0)
MCHC: 33.8 g/dL (ref 30.0–36.0)
MCV: 90 fL (ref 80.0–100.0)
Platelets: 425 10*3/uL — ABNORMAL HIGH (ref 150–400)
RBC: 4.01 MIL/uL — ABNORMAL LOW (ref 4.22–5.81)
RDW: 14.4 % (ref 11.5–15.5)
WBC: 18.2 10*3/uL — ABNORMAL HIGH (ref 4.0–10.5)
nRBC: 0 % (ref 0.0–0.2)

## 2019-09-19 LAB — HEPATITIS PANEL, ACUTE
HCV Ab: NONREACTIVE
Hep A IgM: NONREACTIVE
Hep B C IgM: NONREACTIVE
Hepatitis B Surface Ag: NONREACTIVE

## 2019-09-19 MED ORDER — HEPARIN BOLUS VIA INFUSION
2400.0000 [IU] | Freq: Once | INTRAVENOUS | Status: AC
  Administered 2019-09-19: 2400 [IU] via INTRAVENOUS
  Filled 2019-09-19: qty 2400

## 2019-09-19 MED ORDER — SODIUM CHLORIDE 0.9 % IV SOLN: INTRAVENOUS | Status: AC

## 2019-09-19 NOTE — Telephone Encounter (Signed)
Previous messages left at home #, message left now at mobile number.

## 2019-09-19 NOTE — Progress Notes (Signed)
PROGRESS NOTE    Mason [name]  ZDG:[id number] DOB: May 06, 1946 DOA: 09/18/2019 PCP: Joaquim Nam, MD   Brief Narrative:  Mason [name]  is a 74 y.o. male,  w anxiety/ depression , gerd, diverticulosis, apparently presents with upper abdominal pain x 2 days.  Pt has had subjective fever for the past 10days, covid negative outpatient testing, and tx with doxycycline x 2 days.   Denies n/v, diarrhea, brbpr, black stool, cough, cp, palp, sob.  Pt presented due to abdominal pain.   1/28: Patient soundly sleeping on my arrival.  Appears comfortable.  Pain appears well controlled.  Started on Cipro/Flagyl for acute diverticulitis.  Etiology of portal vein thrombosis unclear.  Gastroenterology consulted requested further evaluation by hematology/oncology.   Assessment & Plan:   Principal Problem:   Diverticulitis Active Problems:   BPH (benign prostatic hyperplasia)   Portal vein thrombosis   Sepsis (HCC)   Abnormal liver function  Sepsis (fever, tachycardia, elevated wbc) Diverticulitis Clinical evidence of sepsis improving Still with persistent leukocytosis Cultures no growth to date Continue Cipro/Flagyl for now GI consulted, appreciate recommendations Bowel rest As needed pain control Fluid bolus as needed  Portal vein thrombosis Currently on heparin infusion Etiology rather unclear Could be related directly to inflammatory state secondary to diverticulitis Per GI appearance on imaging may indicate some element of chronicity Question whether patient will truly require long-term anticoagulation Hematology consulted per GI, appreciate recommendations Continue heparin infusion for now  Abnormal liver function Trending down Hepatitis panel pending  ETOH dep CIWA protocol TOC consult  Gerd Cont PPI  Glaucoma Cont Timolol Cont Lumigan-> Xalatan  Bph Cont Flomax   DVT prophylaxis: Heparin GTT Code Status: Full Family Communication: None today   disposition Plan: Unclear at this time, pending clinical improvement   Consultants:   GI-Wohl  Heme/onc-Finnegan  Procedures:   None  Antimicrobials:   Cipro (1/27-  )  Flagyl  (1/27-  )   Subjective: Seen and examined No acute status changes overnight No new complaints  Objective: Vitals:   09/18/19 2106 09/19/19 0109 09/19/19 0218 09/19/19 0526  BP: 116/68 135/70 117/70 135/68  Pulse: 71 80 74 86  Resp: 18   20  Temp: 99.4 F (37.4 C) 98 F (36.7 C)  99.7 F (37.6 C)  TempSrc: Oral Oral  Oral  SpO2: 98% 98% 94% 97%  Weight:      Height:        Intake/Output Summary (Last 24 hours) at 09/19/2019 1029 Last data filed at 09/19/2019 0941 Gross per 24 hour  Intake 790.07 ml  Output 325 ml  Net 465.07 ml   Filed Weights   09/18/19 1253  Weight: 79.4 kg    Examination:  General exam: Appears calm and comfortable  Respiratory system: Clear to auscultation. Respiratory effort normal. Cardiovascular system: S1 & S2 heard, RRR. No JVD, murmurs, rubs, gallops or clicks. No pedal edema. Gastrointestinal system: Abdomen is nondistended, tenderness to palpation Central nervous system: Alert and oriented. No focal neurological deficits. Extremities: Symmetric 5 x 5 power. Skin: No rashes, lesions or ulcers Psychiatry: Judgement and insight appear normal. Mood & affect appropriate.     Data Reviewed: I have personally reviewed following labs and imaging studies  CBC: Recent Labs  Lab 09/18/19 1304 09/19/19 0332  WBC 18.0* 18.2*  NEUTROABS 13.9*  --   HGB 13.8 12.2*  HCT 41.9 36.1*  MCV 90.7 90.0  PLT 460* 425*   Basic Metabolic Panel: Recent Labs  Lab 09/18/19  1304 09/19/19 0332  NA 137 136  K 3.8 4.0  CL 99 101  CO2 28 22  GLUCOSE 90 76  BUN 11 11  CREATININE 0.92 0.96  CALCIUM 8.7* 8.2*   GFR: Estimated Creatinine Clearance: 66.5 mL/min (by C-G formula based on SCr of 0.96 mg/dL). Liver Function Tests: Recent Labs  Lab  09/18/19 1304 09/19/19 0332  AST 26 26  ALT 53* 42  ALKPHOS 98 78  BILITOT 0.9 0.9  PROT 7.8 6.1*  ALBUMIN 3.3* 2.4*   Recent Labs  Lab 09/18/19 1304  LIPASE 22   No results for input(s): AMMONIA in the last 168 hours. Coagulation Profile: Recent Labs  Lab 09/18/19 1826  INR 1.2   Cardiac Enzymes: No results for input(s): CKTOTAL, CKMB, CKMBINDEX, TROPONINI in the last 168 hours. BNP (last 3 results) No results for input(s): PROBNP in the last 8760 hours. HbA1C: No results for input(s): HGBA1C in the last 72 hours. CBG: No results for input(s): GLUCAP in the last 168 hours. Lipid Profile: No results for input(s): CHOL, HDL, LDLCALC, TRIG, CHOLHDL, LDLDIRECT in the last 72 hours. Thyroid Function Tests: No results for input(s): TSH, T4TOTAL, FREET4, T3FREE, THYROIDAB in the last 72 hours. Anemia Panel: No results for input(s): VITAMINB12, FOLATE, FERRITIN, TIBC, IRON, RETICCTPCT in the last 72 hours. Sepsis Labs: Recent Labs  Lab 09/18/19 1304  LATICACIDVEN 0.8    Recent Results (from the past 240 hour(s))  Novel Coronavirus, NAA ([facility])     Status: None   Collection Time: 09/09/19  1:05 PM   Specimen: Nasopharyngeal(NP) swabs in vial transport medium   NASOPHARYNGE  TESTING  Result Value Ref Range Status   SARS-CoV-2, NAA Not Detected Not Detected Final    Comment: This nucleic acid amplification test was developed and its performance characteristics determined by [employer]. Nucleic acid amplification tests include PCR and TMA. This test has not been FDA cleared or approved. This test has been authorized by FDA under an Emergency Use Authorization (EUA). This test is only authorized for the duration of time the declaration that circumstances exist justifying the authorization of the emergency use of in vitro diagnostic tests for detection of SARS-CoV-2 virus and/or diagnosis of COVID-19 infection under section 564(b)(1) of the Act, 21  U.S.C. 578ION-6(E) (1), unless the authorization is terminated or revoked sooner. When diagnostic testing is negative, the possibility of a false negative result should be considered in the context of a patient's recent exposures and the presence of clinical signs and symptoms consistent with COVID-19. An individual without symptoms of COVID-19 and who is not shedding SARS-CoV-2 virus would  expect to have a negative (not detected) result in this assay.   Blood culture (routine x 2)     Status: None (Preliminary result)   Collection Time: 09/18/19  1:05 PM   Specimen: BLOOD  Result Value Ref Range Status   Specimen Description BLOOD RIGHT ANTECUBITAL  Final   Special Requests   Final    BOTTLES DRAWN AEROBIC AND ANAEROBIC Blood Culture adequate volume   Culture   Final    NO GROWTH < 24 HOURS Performed at [facility], [address] [place], [place] [address]    Report Status PENDING  Incomplete  Respiratory Panel by RT PCR (Flu A&B, Covid) - Nasopharyngeal Swab     Status: None   Collection Time: 09/18/19  4:50 PM   Specimen: Nasopharyngeal Swab  Result Value Ref Range Status   SARS Coronavirus 2 by RT PCR  NEGATIVE NEGATIVE Final    Comment: (NOTE) SARS-CoV-2 target nucleic acids are NOT DETECTED. The SARS-CoV-2 RNA is generally detectable in upper respiratoy specimens during the acute phase of infection. The lowest concentration of SARS-CoV-2 viral copies this assay can detect is 131 copies/mL. A negative result does not preclude SARS-Cov-2 infection and should not be used as the sole basis for treatment or other patient management decisions. A negative result may occur with  improper specimen collection/handling, submission of specimen other than nasopharyngeal swab, presence of viral mutation(s) within the areas targeted by this assay, and inadequate number of viral copies (<131 copies/mL). A negative result must be combined with clinical observations, patient  history, and epidemiological information. The expected result is Negative. Fact Sheet for Patients:  https://www.moore.com/ Fact Sheet for Healthcare Providers:  https://www.young.biz/ This test is not yet ap proved or cleared by the [place] FDA and  has been authorized for detection and/or diagnosis of SARS-CoV-2 by FDA under an Emergency Use Authorization (EUA). This EUA will remain  in effect (meaning this test can be used) for the duration of the COVID-19 declaration under Section 564(b)(1) of the Act, 21 U.S.C. section 360bbb-3(b)(1), unless the authorization is terminated or revoked sooner.    Influenza A by PCR NEGATIVE NEGATIVE Final   Influenza B by PCR NEGATIVE NEGATIVE Final    Comment: (NOTE) The Xpert Xpress SARS-CoV-2/FLU/RSV assay is intended as an aid in  the diagnosis of influenza from Nasopharyngeal swab specimens and  should not be used as a sole basis for treatment. Nasal washings and  aspirates are unacceptable for Xpert Xpress SARS-CoV-2/FLU/RSV  testing. Fact Sheet for Patients: https://www.moore.com/ Fact Sheet for Healthcare Providers: https://www.young.biz/ This test is not yet approved or cleared by the [place] FDA and  has been authorized for detection and/or diagnosis of SARS-CoV-2 by  FDA under an Emergency Use Authorization (EUA). This EUA will remain  in effect (meaning this test can be used) for the duration of the  Covid-19 declaration under Section 564(b)(1) of the Act, 21  U.S.C. section 360bbb-3(b)(1), unless the authorization is  terminated or revoked. Performed at [facility], [address] [place], [place] [address]   Blood culture (routine x 2)     Status: None (Preliminary result)   Collection Time: 09/18/19  9:19 PM   Specimen: BLOOD  Result Value Ref Range Status   Specimen Description BLOOD LEFT HAND  Final   Special Requests   Final     BOTTLES DRAWN AEROBIC AND ANAEROBIC Blood Culture adequate volume   Culture   Final    NO GROWTH < 12 HOURS Performed at [facility], [address], [place], [place] [address]    Report Status PENDING  Incomplete         Radiology Studies: DG Chest 2 View  Result Date: 09/18/2019 CLINICAL DATA:  Unexplained fever 10 days. EXAM: CHEST - 2 VIEW COMPARISON:  08/03/2018 FINDINGS: Lungs are adequately inflated and otherwise clear. Cardiomediastinal silhouette and remainder of the exam is unchanged. IMPRESSION: No active cardiopulmonary disease. Electronically Signed   By: Elberta Fortis M.D.   On: 09/18/2019 13:14   CT ABDOMEN PELVIS W CONTRAST  Result Date: 09/18/2019 CLINICAL DATA:  Fever for the last 10 days. History of diverticulosis. Suspected abdominal infection. EXAM: CT ABDOMEN AND PELVIS WITH CONTRAST TECHNIQUE: Multidetector CT imaging of the abdomen and pelvis was performed using the standard protocol following bolus administration of intravenous contrast. CONTRAST:  OMNIPAQUE IOHEXOL 300  MG/ML  SOLN COMPARISON:  01/24/2007 FINDINGS: Lower chest: Normal Hepatobiliary: Chronic 1 cm cyst of the left lobe. Chronic 1 cm cyst at the ventral right lobe. Previously seen 1 cm cyst in the medial segment of the left lobe no longer visible. Geographic fatty change of the liver. More inhomogeneous fatty change of the left lobe. I think findings less likely represent liver infection or infiltrating tumor. There appears to be localized thrombosis of the left division of the portal vein. Pancreas: Normal Spleen: Normal Adrenals/Urinary Tract: Adrenal glands are normal. Kidneys are normal. Bladder is normal. Stomach/Bowel: No sign of bowel obstruction. Advanced diverticulosis of the sigmoid colon. Large diverticulum containing inspissated stool. Low level acute diverticulitis at the descending sigmoid junction. The patient has a giant diverticulum distal to that with a partially calcified  stool ball. Vascular/Lymphatic: Aortic atherosclerosis. IVC is normal. No retroperitoneal adenopathy. Reproductive: Enlarged prostate. Other: No free fluid or air. Musculoskeletal: Lower lumbar chronic degenerative changes. IMPRESSION: Extensive diverticulosis in the sigmoid region. Acute diverticulitis at the descending sigmoid junction. No evidence of abscess or peritonitis. 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 Geographic fatty change of the liver. Heterogeneous appearance of the left lobe probably relates to geographic fatty change in possibly flow disturbance because of the localized portal vein thrombosis. Portal vein thrombosis can occur in the setting of acute inflammatory processes. Electronically Signed   By: Paulina Fusi M.D.   On: 09/18/2019 17:40        Scheduled Meds: . fluticasone  2 spray Each Nare Daily  . folic acid  1 mg Oral Daily  . latanoprost  1 drop Both Eyes QHS  . loratadine  10 mg Oral Daily  . LORazepam  0-4 mg Intravenous Q6H   Followed by  . [START ON 09/20/2019] LORazepam  0-4 mg Intravenous Q12H  . multivitamin with minerals  1 tablet Oral Daily  . pantoprazole  40 mg Oral Daily  . sodium chloride flush  3 mL Intravenous Once  . tamsulosin  0.4 mg Oral QPC supper  . thiamine  100 mg Oral Daily   Or  . thiamine  100 mg Intravenous Daily  . timolol  1 drop Left Eye q morning - 10a   Continuous Infusions: . ciprofloxacin 400 mg (09/19/19 0941)  . heparin 1,400 Units/hr (09/19/19 0844)  . metronidazole       LOS: 1 day    Time spent: 35 minutes    Tresa Moore, MD [facility] Pager [phone]  If 7PM-7AM, please contact night-coverage [email or web address] 09/19/2019, 10:29 AM

## 2019-09-19 NOTE — Consult Note (Signed)
[facility]  [phone] [phone]  ID: Mason [name] OB: Feb 15, 1946  MR#: [id number]  OVF#:[id number]  Patient Care Team: Joaquim Nam, MD as PCP - General (Family Medicine) Deirdre Evener, MD as Consulting Physician (Dermatology) Pinnix-Bailey, Dinah Beers, MD as Consulting Physician (Dentistry) Judith Blonder. Starla Link, OD as Consulting Physician (Optometry) Drema Dallas, DO as Consulting Physician (Neurology)  CHIEF COMPLAINT: Portal vein thrombosis.  INTERVAL HISTORY: Patient is a 74 year old male who presented to the emergency room with fever and night sweats for approximately 7 to 10 days.  He also reported increasing upper abdominal pain over the last 2 to 3 days.  Prior to his symptoms, he felt well and was asymptomatic.  He has no neurologic complaints.  He has a good appetite and denies weight loss.  He has no chest pain, shortness of breath, cough, or hemoptysis.  He denies any nausea, vomiting, constipation, or diarrhea.  He has no further abdominal pain.  He has no urinary complaints.  Patient otherwise feels well and offers no further specific complaints today.  REVIEW OF SYSTEMS:   Review of Systems  Constitutional: Positive for diaphoresis and fever. Negative for malaise/fatigue and weight loss.  Respiratory: Negative.  Negative for cough, hemoptysis and shortness of breath.   Cardiovascular: Negative.  Negative for chest pain and leg swelling.  Gastrointestinal: Negative.  Negative for abdominal pain, blood in stool and melena.  Genitourinary: Negative.  Negative for dysuria, flank pain and hematuria.  Musculoskeletal: Negative.  Negative for back pain.  Skin: Negative.  Negative for rash.  Neurological: Negative.  Negative for dizziness, focal weakness, weakness and headaches.  Psychiatric/Behavioral: Negative.  The patient is not nervous/anxious.     As per HPI. Otherwise, a complete review of systems is negative.  PAST  MEDICAL HISTORY: Past Medical History:  Diagnosis Date  . Allergy   . Anal fissure   . Anxiety   . Cancer (HCC)    basal cell removed x4  . Cataract    bil eyes  . Depression   . Diverticulosis of colon   . GERD (gastroesophageal reflux disease)    esophageal narrowing  . Glaucoma   . Hiatal hernia   . IBS (irritable bowel syndrome)   . Insomnia   . Internal hemorrhoids     PAST SURGICAL HISTORY: Past Surgical History:  Procedure Laterality Date  . ANAL SPHINCTEROTOMY  03/1995   fistulotomy  . APPENDECTOMY    . BASAL CELL CARCINOMA EXCISION     on back  . BICEPT TENODESIS Right 09/27/2004   decompression  . COLONOSCOPY  04/1999   internal hemmroids,12-11-08 colon tics and hems only   . ESOPHAGOGASTRODUODENOSCOPY  04/1999   with esophagitis and stricture  . MOHS SURGERY  09/02/2008   L supratip of nose with flap,basal cell cancer [facility]/MOHS  . TONSILLECTOMY AND ADENOIDECTOMY     remote  . UPPER GASTROINTESTINAL ENDOSCOPY    . WRIST SURGERY Right    13 years ago  . WRIST SURGERY     scar tissue removed    FAMILY HISTORY: Family History  Problem Relation Age of Onset  . Hypertension Mother   . Stroke Mother   . Pneumonia Father   . Bladder Cancer Father   . Cancer Maternal Aunt        ? type  . Breast cancer Maternal Aunt        mets  . Esophageal cancer Maternal Aunt  aunt ? esophageal ca  . Stomach cancer Maternal Aunt        aunt ? stmach ca  . Colon cancer Maternal Aunt        had colostomy - not sure of origin  . Cancer Maternal Grandmother   . Prostate cancer Neg Hx   . Rectal cancer Neg Hx     ADVANCED DIRECTIVES (Y/N):  @ADVDIR @  HEALTH MAINTENANCE: Social History   Tobacco Use  . Smoking status: Former Smoker    Types: Cigarettes, Cigars    Quit date: 08/22/1998    Years since quitting: 21.0  . Smokeless tobacco: Never Used  . Tobacco comment: cigarettes 1968, cigars 2000  Substance Use Topics  . Alcohol use: Yes     Alcohol/week: 0.0 standard drinks    Comment: rare  . Drug use: No     Colonoscopy:  PAP:  Bone density:  Lipid panel:  Allergies  Allergen Reactions  . Chlordiazepoxide-Clidinium     REACTION: urinary retention  . Citalopram Hydrobromide     Intolerant, tried 03/2011.  "It made me very angry".  . Erythromycin Base     REACTION: stomach upset  . Hyoscyamine Sulfate     REACTION: urinary retention  . Meloxicam     GI upset  . Penicillins     REACTION: Itching, rash  . Shrimp [Shellfish Allergy]     And crab- rash, GI upset, upper airway symptoms    Current Facility-Administered Medications  Medication Dose Route Frequency Provider Last Rate Last Admin  . 0.9 %  sodium chloride infusion   Intravenous Continuous Tresa Moore, MD   Stopped at 09/19/19 1645  . acetaminophen (TYLENOL) tablet 650 mg  650 mg Oral Q6H PRN Pearson Grippe, MD   650 mg at 09/19/19 1311   Or  . acetaminophen (TYLENOL) suppository 650 mg  650 mg Rectal Q6H PRN Pearson Grippe, MD      . ciprofloxacin (CIPRO) IVPB 400 mg  400 mg Intravenous Q12H Ronnald Ramp, [facility]   Stopped at 09/19/19 1041  . diphenhydrAMINE (BENADRYL) capsule 50 mg  50 mg Oral QHS PRN Pearson Grippe, MD      . fluticasone Aleda Grana) 50 MCG/ACT nasal spray 2 spray  2 spray Each Nare Daily Pearson Grippe, MD      . folic acid (FOLVITE) tablet 1 mg  1 mg Oral Daily Pearson Grippe, MD      . heparin ADULT infusion 100 units/mL (25000 units/258mL sodium chloride 0.45%)  1,700 Units/hr Intravenous Continuous Lowella Bandy, RPH 17 mL/hr at 09/19/19 1700 1,700 Units/hr at 09/19/19 1700  . latanoprost (XALATAN) 0.005 % ophthalmic solution 1 drop  1 drop Both Eyes QHS Pearson Grippe, MD   1 drop at 09/18/19 2241  . loratadine (CLARITIN) tablet 10 mg  10 mg Oral Daily Pearson Grippe, MD      . LORazepam (ATIVAN) injection 0-4 mg  0-4 mg Intravenous Q6H Pearson Grippe, MD   2 mg at 09/19/19 0114   Followed by  . [START ON 09/20/2019] LORazepam (ATIVAN) injection 0-4 mg   0-4 mg Intravenous Q12H Pearson Grippe, MD      . LORazepam (ATIVAN) tablet 1-4 mg  1-4 mg Oral Q1H PRN Pearson Grippe, MD       Or  . LORazepam (ATIVAN) injection 1-4 mg  1-4 mg Intravenous Q1H PRN Pearson Grippe, MD      . metroNIDAZOLE (FLAGYL) IVPB 500 mg  500 mg Intravenous Q8H Pearson Grippe, MD 100  mL/hr at 09/19/19 1053 500 mg at 09/19/19 1053  . multivitamin with minerals tablet 1 tablet  1 tablet Oral Daily Pearson Grippe, MD   1 tablet at 09/19/19 1048  . pantoprazole (PROTONIX) EC tablet 40 mg  40 mg Oral Daily Pearson Grippe, MD   40 mg at 09/19/19 1048  . sodium chloride flush (NS) 0.9 % injection 3 mL  3 mL Intravenous Once Willy Eddy, MD      . tamsulosin [facility]) capsule 0.4 mg  0.4 mg Oral QPC supper Pearson Grippe, MD   0.4 mg at 09/19/19 1850  . thiamine tablet 100 mg  100 mg Oral Daily Pearson Grippe, MD   100 mg at 09/19/19 1048   Or  . thiamine (B-1) injection 100 mg  100 mg Intravenous Daily Pearson Grippe, MD      . timolol (TIMOPTIC) 0.5 % ophthalmic solution 1 drop  1 drop Left Eye q morning - 10a Pearson Grippe, MD   1 drop at 09/19/19 [phone]  . tiZANidine (ZANAFLEX) tablet 2 mg  2 mg Oral Q8H PRN Pearson Grippe, MD        OBJECTIVE: Vitals:   09/19/19 0218 09/19/19 0526  BP: 117/70 135/68  Pulse: 74 86  Resp:  20  Temp:  99.7 F (37.6 C)  SpO2: 94% 97%     Body mass index is 25.84 kg/m.    ECOG FS:1 - Symptomatic but completely ambulatory  General: Well-developed, well-nourished, no acute distress. Eyes: Pink conjunctiva, anicteric sclera. HEENT: Normocephalic, moist mucous membranes. Lungs: No audible wheezing or coughing. Heart: Regular rate and rhythm. Abdomen: Soft, nontender, no obvious distention. Musculoskeletal: No edema, cyanosis, or clubbing. Neuro: Alert, answering all questions appropriately. Cranial nerves grossly intact. Skin: No rashes or petechiae noted. Psych: Normal affect. Lymphatics: No cervical, calvicular, axillary or inguinal LAD.   LAB RESULTS:  Lab Results   Component Value Date   NA 136 09/19/2019   K 4.0 09/19/2019   CL 101 09/19/2019   CO2 22 09/19/2019   GLUCOSE 76 09/19/2019   BUN 11 09/19/2019   CREATININE 0.96 09/19/2019   CALCIUM 8.2 (L) 09/19/2019   PROT 6.1 (L) 09/19/2019   ALBUMIN 2.4 (L) 09/19/2019   AST 26 09/19/2019   ALT 42 09/19/2019   ALKPHOS 78 09/19/2019   BILITOT 0.9 09/19/2019   GFRNONAA >60 09/19/2019   GFRAA >60 09/19/2019    Lab Results  Component Value Date   WBC 18.2 (H) 09/19/2019   NEUTROABS 13.9 (H) 09/18/2019   HGB 12.2 (L) 09/19/2019   HCT 36.1 (L) 09/19/2019   MCV 90.0 09/19/2019   PLT 425 (H) 09/19/2019     STUDIES: DG Chest 2 View  Result Date: 09/18/2019 CLINICAL DATA:  Unexplained fever 10 days. EXAM: CHEST - 2 VIEW COMPARISON:  08/03/2018 FINDINGS: Lungs are adequately inflated and otherwise clear. Cardiomediastinal silhouette and remainder of the exam is unchanged. IMPRESSION: No active cardiopulmonary disease. Electronically Signed   By: Elberta Fortis M.D.   On: 09/18/2019 13:14   CT ABDOMEN PELVIS W CONTRAST  Result Date: 09/18/2019 CLINICAL DATA:  Fever for the last 10 days. History of diverticulosis. Suspected abdominal infection. EXAM: CT ABDOMEN AND PELVIS WITH CONTRAST TECHNIQUE: Multidetector CT imaging of the abdomen and pelvis was performed using the standard protocol following bolus administration of intravenous contrast. CONTRAST:  OMNIPAQUE IOHEXOL 300 MG/ML  SOLN COMPARISON:  01/24/2007 FINDINGS: Lower chest: Normal Hepatobiliary: Chronic 1 cm cyst of the left lobe. Chronic 1 cm cyst  at the ventral right lobe. Previously seen 1 cm cyst in the medial segment of the left lobe no longer visible. Geographic fatty change of the liver. More inhomogeneous fatty change of the left lobe. I think findings less likely represent liver infection or infiltrating tumor. There appears to be localized thrombosis of the left division of the portal vein. Pancreas: Normal Spleen: Normal  Adrenals/Urinary Tract: Adrenal glands are normal. Kidneys are normal. Bladder is normal. Stomach/Bowel: No sign of bowel obstruction. Advanced diverticulosis of the sigmoid colon. Large diverticulum containing inspissated stool. Low level acute diverticulitis at the descending sigmoid junction. The patient has a giant diverticulum distal to that with a partially calcified stool ball. Vascular/Lymphatic: Aortic atherosclerosis. IVC is normal. No retroperitoneal adenopathy. Reproductive: Enlarged prostate. Other: No free fluid or air. Musculoskeletal: Lower lumbar chronic degenerative changes. IMPRESSION: Extensive diverticulosis in the sigmoid region. Acute diverticulitis at the descending sigmoid junction. No evidence of abscess or peritonitis. 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 Geographic fatty change of the liver. Heterogeneous appearance of the left lobe probably relates to geographic fatty change in possibly flow disturbance because of the localized portal vein thrombosis. Portal vein thrombosis can occur in the setting of acute inflammatory processes. Electronically Signed   By: Paulina Fusi M.D.   On: 09/18/2019 17:40    ASSESSMENT: Portal vein thrombosis.  PLAN:    1.  Portal vein thrombosis: Unclear etiology.  Likely secondary to inflammatory process detailed in GI consult.  Appreciate their input.  Currently unclear the length of anticoagulation patient will require, but standard recommendation is 3 to 6 months if no definitive etiology can be determined.  Continue antibiotics per GI.  Okay to transition patient to Xarelto or Eliquis upon discharge.  Will initiate malignancy work-up for completeness.  Patient will also require a CT of his chest for completion of imaging.  This can be accomplished as outpatient if necessary.  No further intervention is needed at this time.  Will arrange follow-up in the cancer  center in 1 to 2 weeks for further evaluation and discussion of length of anticoagulation necessary.  Appreciate consult, will follow.   Jeralyn Ruths, MD   09/19/2019 8:01 PM

## 2019-09-19 NOTE — Telephone Encounter (Signed)
Lvm for pt to call back.  Dr. Darnell Level is asking for an update on the pt.

## 2019-09-19 NOTE — Consult Note (Signed)
Mason Lame, MD [facility]  [address], [place], [place] [address] Phone: [phone] Fax : [phone]  Consultation  Referring Provider:     Dr. Louanne Belton Primary Care Physician:  Tonia Ghent, MD Primary Gastroenterologist:  Dr. Henrene Pastor         Reason for Consultation:     Diverticulitis and portal vein thrombosis  Date of Admission:  09/18/2019 Date of Consultation:  09/19/2019         HPI:   Mason [name] is a 74 y.o. male who was admitted to the hospital with several days of fevers and abdominal pain.  The patient has a history of having a colonoscopy in [place] in 2016 with severe left-sided diverticulosis.  The patient underwent a CT scan that showed the patient to have diverticulitis in the descending colon without any sign of abscess or peritonitis.  The patient was noted to have a thrombus in the left division of the portal vein and geographic fatty liver changes and was reported to may be a result of flow disturbance because of the portal vein thrombosis and it was mentioned that portal vein thrombosis can occur in the setting of acute inflammatory process.  The patient was started on antibiotics and heparin.  The patient reports that he has had diverticulitis in the past and this does not feel like he did in the past with left-sided abdominal pain.  The patient now reports that his pain is periumbilical at this time.  He also states that prior to coming to the hospital he had some chills and reports that he had urinary incontinence trying to get to the bathroom.  He also states that since he has been here he is feeling much more tired and weak today than he was yesterday.  He states that he got up with the IV pole and was able to go to bathroom twice yesterday but this morning he did not even make it off the bed and appears to have urinated again on himself.  He denies any alcohol abuse or hypercoagulable state.  He also denies being told that he has had abnormal liver  enzymes in the past.  Acute hepatitis panel was sent off yesterday I presume for a elevated ALT of 53 but no other abnormal liver enzymes.  Repeat liver enzymes this morning showed his liver enzymes to be normal. The patient has had an elevated white cell count with this morning being 18.2 which is slightly increased from 18.0 of yesterday.  Past Medical History:  Diagnosis Date  . Allergy   . Anal fissure   . Anxiety   . Cancer ([place])    basal cell removed x4  . Cataract    bil eyes  . Depression   . Diverticulosis of colon   . GERD (gastroesophageal reflux disease)    esophageal narrowing  . Glaucoma   . Hiatal hernia   . IBS (irritable bowel syndrome)   . Insomnia   . Internal hemorrhoids     Past Surgical History:  Procedure Laterality Date  . ANAL SPHINCTEROTOMY  03/1995   fistulotomy  . APPENDECTOMY    . BASAL CELL CARCINOMA EXCISION     on back  . BICEPT TENODESIS Right 09/27/2004   decompression  . COLONOSCOPY  04/1999   internal hemmroids,12-11-08 colon tics and hems only   . ESOPHAGOGASTRODUODENOSCOPY  04/1999   with esophagitis and stricture  . MOHS SURGERY  09/02/2008   L supratip of nose with flap,basal  cell cancer [facility]/MOHS  . TONSILLECTOMY AND ADENOIDECTOMY     remote  . UPPER GASTROINTESTINAL ENDOSCOPY    . WRIST SURGERY Right    13 years ago  . WRIST SURGERY     scar tissue removed    Prior to Admission medications   Medication Sig Start Date End Date Taking? Authorizing Provider  b complex vitamins capsule Take 1 capsule by mouth daily.   Yes [provider]  Bimatoprost (LUMIGAN) 0.01 % SOLN Apply 1 drop to eye daily.     Yes [provider]  cholecalciferol (VITAMIN D) 1000 UNITS tablet Take 1,000 Units by mouth daily.   Yes [provider]  esomeprazole (NEXIUM) 40 MG capsule TAKE 1 CAPSULE BY MOUTH TWICE EVERY DAY Patient taking differently: Take 40 mg by mouth daily. TAKE 1 CAPSULE BY MOUTH TWICE EVERY DAY 07/01/19   Yes Tonia Ghent, MD  fluticasone [facility]) 50 MCG/ACT nasal spray Place 2 sprays into both nostrils daily. 06/28/18  Yes Tonia Ghent, MD  gabapentin (NEURONTIN) 100 MG capsule Take 1 capsule (100 mg total) by mouth 2 (two) times daily. Patient taking differently: Take 100 mg by mouth 2 (two) times daily as needed.  07/31/19  Yes Lyndal Pulley, DO  gabapentin (NEURONTIN) 300 MG capsule Take 1 capsule (300 mg total) by mouth 2 (two) times daily. Patient taking differently: Take 300 mg by mouth 2 (two) times daily as needed.  07/30/19  Yes Jaffe, Adam R, DO  levocetirizine (XYZAL) 5 MG tablet Take 1 tablet (5 mg total) by mouth every evening. Patient taking differently: Take 5 mg by mouth daily as needed.  06/28/18  Yes Tonia Ghent, MD  tamsulosin (FLOMAX) 0.4 MG CAPS capsule TAKE 1-2 CAPSULES (0.4- 0.8 MG TOTAL) BY MOUTH DAILY. 07/01/19  Yes Tonia Ghent, MD  timolol (TIMOPTIC-XR) 0.5 % ophthalmic gel-forming Place 1 drop into the left eye every morning. 07/15/19  Yes [provider]  vitamin C (ASCORBIC ACID) 500 MG tablet Take 500 mg by mouth daily.   Yes [provider]  tiZANidine (ZANAFLEX) 2 MG tablet Take 1 tablet (2 mg total) by mouth every 8 (eight) hours as needed for muscle spasms. 07/30/19   Pieter Partridge, DO  traZODone (DESYREL) 50 MG tablet 0.5-1 tab at night for insomnia. 12/16/10 11/03/11  Tonia Ghent, MD    Family History  Problem Relation Age of Onset  . Hypertension Mother   . Stroke Mother   . Pneumonia Father   . Bladder Cancer Father   . Cancer Maternal Aunt        ? type  . Breast cancer Maternal Aunt        mets  . Esophageal cancer Maternal Aunt        aunt ? esophageal ca  . Stomach cancer Maternal Aunt        aunt ? stmach ca  . Colon cancer Maternal Aunt        had colostomy - not sure of origin  . Cancer Maternal Grandmother   . Prostate cancer Neg Hx   . Rectal cancer Neg Hx      Social History   Tobacco Use  .  Smoking status: Former Smoker    Types: Cigarettes, Cigars    Quit date: 08/22/1998    Years since quitting: 21.0  . Smokeless tobacco: Never Used  . Tobacco comment: cigarettes 1968, cigars 2000  Substance Use Topics  . Alcohol use: Yes  Alcohol/week: 0.0 standard drinks    Comment: rare  . Drug use: No    Allergies as of 09/18/2019 - Review Complete 09/18/2019  Allergen Reaction Noted  . Chlordiazepoxide-clidinium  12/22/2006  . Citalopram hydrobromide  04/04/2011  . Erythromycin base  12/22/2006  . Hyoscyamine sulfate  12/22/2006  . Meloxicam  07/04/2019  . Penicillins  12/22/2006  . Shrimp [shellfish allergy]  11/03/2011    Review of Systems:    All systems reviewed and negative except where noted in HPI.   Physical Exam:  Vital signs in last 24 hours: Temp:  [98 F (36.7 C)-103 F (39.4 C)] 99.7 F (37.6 C) (01/28 0526) Pulse Rate:  [71-89] 86 (01/28 0526) Resp:  [16-20] 20 (01/28 0526) BP: (116-173)/(66-95) 135/68 (01/28 0526) SpO2:  [94 %-100 %] 97 % (01/28 0526) Weight:  [79.4 kg] 79.4 kg (01/27 1253) Last BM Date: 09/18/19 General:   Pleasant, cooperative in NAD Head:  Normocephalic and atraumatic. Eyes:   No icterus.   Conjunctiva pink. PERRLA. Ears:  Normal auditory acuity. Neck:  Supple; no masses or thyroidomegaly Lungs: Respirations even and unlabored. Lungs clear to auscultation bilaterally.   No wheezes, crackles, or rhonchi.  Heart:  Regular rate and rhythm;  Without murmur, clicks, rubs or gallops Abdomen:  Soft, mild mid abdominal tenderness, nontender. Normal bowel sounds. No appreciable masses or hepatomegaly.  No rebound or guarding.  Rectal:  Not performed. Msk:  Symmetrical without gross deformities.    Extremities:  Without edema, cyanosis or clubbing. Neurologic:  Alert and oriented x3;  grossly normal neurologically. Skin:  Intact without significant lesions or rashes. Cervical Nodes:  No significant cervical adenopathy. Psych:  Alert  and cooperative. Normal affect.  LAB RESULTS: Recent Labs    09/18/19 1304 09/19/19 0332  WBC 18.0* 18.2*  HGB 13.8 12.2*  HCT 41.9 36.1*  PLT 460* 425*   BMET Recent Labs    09/18/19 1304 09/19/19 0332  NA 137 136  K 3.8 4.0  CL 99 101  CO2 28 22  GLUCOSE 90 76  BUN 11 11  CREATININE 0.92 0.96  CALCIUM 8.7* 8.2*   LFT Recent Labs    09/19/19 0332  PROT 6.1*  ALBUMIN 2.4*  AST 26  ALT 42  ALKPHOS 78  BILITOT 0.9   PT/INR Recent Labs    09/18/19 1826  LABPROT 14.7  INR 1.2    STUDIES: DG Chest 2 View  Result Date: 09/18/2019 CLINICAL DATA:  Unexplained fever 10 days. EXAM: CHEST - 2 VIEW COMPARISON:  08/03/2018 FINDINGS: Lungs are adequately inflated and otherwise clear. Cardiomediastinal silhouette and remainder of the exam is unchanged. IMPRESSION: No active cardiopulmonary disease. Electronically Signed   By: Marin Olp M.D.   On: 09/18/2019 13:14   CT ABDOMEN PELVIS W CONTRAST  Result Date: 09/18/2019 CLINICAL DATA:  Fever for the last 10 days. History of diverticulosis. Suspected abdominal infection. EXAM: CT ABDOMEN AND PELVIS WITH CONTRAST TECHNIQUE: Multidetector CT imaging of the abdomen and pelvis was performed using the standard protocol following bolus administration of intravenous contrast. CONTRAST:  167mL OMNIPAQUE IOHEXOL 300 MG/ML  SOLN COMPARISON:  01/24/2007 FINDINGS: Lower chest: Normal Hepatobiliary: Chronic 1 cm cyst of the left lobe. Chronic 1 cm cyst at the ventral right lobe. Previously seen 1 cm cyst in the medial segment of the left lobe no longer visible. Geographic fatty change of the liver. More inhomogeneous fatty change of the left lobe. I think findings less likely represent liver infection or infiltrating  tumor. There appears to be localized thrombosis of the left division of the portal vein. Pancreas: Normal Spleen: Normal Adrenals/Urinary Tract: Adrenal glands are normal. Kidneys are normal. Bladder is normal. Stomach/Bowel: No  sign of bowel obstruction. Advanced diverticulosis of the sigmoid colon. Large diverticulum containing inspissated stool. Low level acute diverticulitis at the descending sigmoid junction. The patient has a giant diverticulum distal to that with a partially calcified stool ball. Vascular/Lymphatic: Aortic atherosclerosis. IVC is normal. No retroperitoneal adenopathy. Reproductive: Enlarged prostate. Other: No free fluid or air. Musculoskeletal: Lower lumbar chronic degenerative changes. IMPRESSION: Extensive diverticulosis in the sigmoid region. Acute diverticulitis at the descending sigmoid junction. No evidence of abscess or peritonitis. 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 Geographic fatty change of the liver. Heterogeneous appearance of the left lobe probably relates to geographic fatty change in possibly flow disturbance because of the localized portal vein thrombosis. Portal vein thrombosis can occur in the setting of acute inflammatory processes. Electronically Signed   By: Nelson Chimes M.D.   On: 09/18/2019 17:40      Impression / Plan:   Assessment: Principal Problem:   Diverticulitis Active Problems:   BPH (benign prostatic hyperplasia)   Portal vein thrombosis   Sepsis (HCC)   Abnormal liver function   RAEVON SUDBECK is a 74 y.o. y/o male with diverticulitis and portal vein thrombosis.  The patient follows with Dr. Henrene Pastor in [place] and reports that he has had extensive diverticulosis at his previous colonoscopies.  The patient has also had diverticulitis in the past.  The patient now is being treated with antibiotics for his diverticulitis but is feeling weak.  The patient's imaging showed him to have a thrombus in the left division of the portal vein with fatty changes in the liver on the same side as the portal vein suggesting possible more of a chronic thrombosis than acute although rapid liver  changes can occur.  The portal vein thrombosis may be related to a hematological disorder versus a malignancy versus inflammation.  Plan:  I recommend continue antibiotics for his diverticulitis and getting a hematology consult for a possible work-up of the patient with unexplained portal vein thrombosis and possible need for anticoagulation upon discharge.  It is debatable whether this is a chronic portal vein thrombosis that has limited data of the efficacy of anticoagulation versus an acute portal vein thrombosis that may benefit from anticoagulation since extension of the thrombus to the right side could be detrimental to this patient's health.  I discussed the plan with the patient and he agrees with it.  Nothing really further to do from a GI point of view since he is already on antibiotics and any anticoagulation will be managed by my hematology colleagues.  Thank you for involving me in the care of this patient.      LOS: 1 day   Mason Lame, MD  09/19/2019, 10:06 AM Pager [phone] 7am-5pm  Check AMION for 5pm -7am coverage and on weekends   Note: This dictation was prepared with Dragon dictation along with smaller phrase technology. Any transcriptional errors that result from this process are unintentional.

## 2019-09-19 NOTE — Progress Notes (Signed)
ANTICOAGULATION CONSULT NOTE  Pharmacy Consult for Heparin  Indication: DVT  Patient Measurements: Height: 5\' 9"  (175.3 cm) Weight: 175 lb (79.4 kg) IBW/kg (Calculated) : 70.7  Vital Signs: Temp: 99.7 F (37.6 C) (01/28 0526) Temp Source: Oral (01/28 0526) BP: 135/68 (01/28 0526) Pulse Rate: 86 (01/28 0526)  Labs: Recent Labs    09/18/19 1304 09/18/19 1826 09/19/19 0332  HGB 13.8  --  12.2*  HCT 41.9  --  36.1*  PLT 460*  --  425*  APTT  --  41*  --   LABPROT  --  14.7  --   INR  --  1.2  --   HEPARINUNFRC  --   --  0.11*  CREATININE 0.92  --  0.96    Estimated Creatinine Clearance: 66.5 mL/min (by C-G formula based on SCr of 0.96 mg/dL).   Medical History: Past Medical History:  Diagnosis Date  . Allergy   . Anal fissure   . Anxiety   . Cancer ([place])    basal cell removed x4  . Cataract    bil eyes  . Depression   . Diverticulosis of colon   . GERD (gastroesophageal reflux disease)    esophageal narrowing  . Glaucoma   . Hiatal hernia   . IBS (irritable bowel syndrome)   . Insomnia   . Internal hemorrhoids     Medications:  Medications Prior to Admission  Medication Sig Dispense Refill Last Dose  . b complex vitamins capsule Take 1 capsule by mouth daily.   09/18/2019 at Unknown time  . Bimatoprost (LUMIGAN) 0.01 % SOLN Apply 1 drop to eye daily.     09/18/2019 at Unknown time  . cholecalciferol (VITAMIN D) 1000 UNITS tablet Take 1,000 Units by mouth daily.   09/18/2019 at Unknown time  . esomeprazole (NEXIUM) 40 MG capsule TAKE 1 CAPSULE BY MOUTH TWICE EVERY DAY (Patient taking differently: Take 40 mg by mouth daily. TAKE 1 CAPSULE BY MOUTH TWICE EVERY DAY) 60 capsule 12 09/17/2019 at Unknown time  . fluticasone (FLONASE) 50 MCG/ACT nasal spray Place 2 sprays into both nostrils daily.   09/17/2019 at Unknown time  . gabapentin (NEURONTIN) 100 MG capsule Take 1 capsule (100 mg total) by mouth 2 (two) times daily. (Patient taking differently: Take 100 mg by  mouth 2 (two) times daily as needed. ) 60 capsule 3 prn at prn  . gabapentin (NEURONTIN) 300 MG capsule Take 1 capsule (300 mg total) by mouth 2 (two) times daily. (Patient taking differently: Take 300 mg by mouth 2 (two) times daily as needed. ) 60 capsule 3 prn at prn  . levocetirizine (XYZAL) 5 MG tablet Take 1 tablet (5 mg total) by mouth every evening. (Patient taking differently: Take 5 mg by mouth daily as needed. ) 30 tablet 12 prn at prn  . tamsulosin (FLOMAX) 0.4 MG CAPS capsule TAKE 1-2 CAPSULES (0.4- 0.8 MG TOTAL) BY MOUTH DAILY. 60 capsule 12 09/17/2019 at Unknown time  . timolol (TIMOPTIC-XR) 0.5 % ophthalmic gel-forming Place 1 drop into the left eye every morning.   09/18/2019 at Unknown time  . vitamin C (ASCORBIC ACID) 500 MG tablet Take 500 mg by mouth daily.   09/18/2019 at Unknown time  . tiZANidine (ZANAFLEX) 2 MG tablet Take 1 tablet (2 mg total) by mouth every 8 (eight) hours as needed for muscle spasms. 90 tablet 3 prn at prn    Assessment: Pharmacy consulted to dose heparin in this 74 year old male admitted  with DVT.  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 No prior anticoag noted. Baseline labs: INR 1.2, aPTT 41s. H&H trending down, platelets elevated and stable  Goal of Therapy:  Heparin level 0.3-0.7 units/ml Monitor platelets by anticoagulation protocol: Yes   Heparin Course: 1/27 initiation: 5000 unit bolus, then 1200 units/hr 1/28 0332 HL 0.11: 2400 unit bolus, then inc to 1400 units/hr 1/28 1323 HL 0.13: 2400 unit bolus, then inc to 1700 units/hr   Plan:   Heparin level subtherapeutic: rebolus heparin 2400 units IV x 1 and increase rate to 1700 units/hr  recheck heparin level at midnight   CBC in am  Vallery Sa, PharmD, BCPS Clinical Pharmacist 09/19/2019,8:07 AM

## 2019-09-19 NOTE — Assessment & Plan Note (Signed)
Unclear source of fever. Options d/w pt.  Needs UC eval given fever, instead of coming to clinic.  Our current clinic protocol, and due to the pandemic, we are not seeing febrile patients here in the clinic. I called over to urgent care, they are able to see the patient today, they will work him into the schedule, and I will await their reports.  See follow-up notes.  Patient agrees with this plan. No charge for visit as patient is being routed to urgent care.

## 2019-09-19 NOTE — Progress Notes (Signed)
ANTICOAGULATION CONSULT NOTE - Initial Consult  Pharmacy Consult for Heparin  Indication: DVT  Allergies  Allergen Reactions  . Chlordiazepoxide-Clidinium     REACTION: urinary retention  . Citalopram Hydrobromide     Intolerant, tried 03/2011.  "It made me very angry".  . Erythromycin Base     REACTION: stomach upset  . Hyoscyamine Sulfate     REACTION: urinary retention  . Meloxicam     GI upset  . Penicillins     REACTION: Itching, rash  . Shrimp [Shellfish Allergy]     And crab- rash, GI upset, upper airway symptoms    Patient Measurements: Height: 5\' 9"  (175.3 cm) Weight: 175 lb (79.4 kg) IBW/kg (Calculated) : 70.7 Heparin Dosing Weight:   Vital Signs: Temp: 98 F (36.7 C) (01/28 0109) Temp Source: Oral (01/28 0109) BP: 117/70 (01/28 0218) Pulse Rate: 74 (01/28 0218)  Labs: Recent Labs    09/18/19 1304 09/18/19 1826 09/19/19 0332  HGB 13.8  --  12.2*  HCT 41.9  --  36.1*  PLT 460*  --  425*  APTT  --  41*  --   LABPROT  --  14.7  --   INR  --  1.2  --   HEPARINUNFRC  --   --  0.11*  CREATININE 0.92  --  0.96    Estimated Creatinine Clearance: 66.5 mL/min (by C-G formula based on SCr of 0.96 mg/dL).   Medical History: Past Medical History:  Diagnosis Date  . Allergy   . Anal fissure   . Anxiety   . Cancer ([place])    basal cell removed x4  . Cataract    bil eyes  . Depression   . Diverticulosis of colon   . GERD (gastroesophageal reflux disease)    esophageal narrowing  . Glaucoma   . Hiatal hernia   . IBS (irritable bowel syndrome)   . Insomnia   . Internal hemorrhoids     Medications:  Medications Prior to Admission  Medication Sig Dispense Refill Last Dose  . b complex vitamins capsule Take 1 capsule by mouth daily.   09/18/2019 at Unknown time  . Bimatoprost (LUMIGAN) 0.01 % SOLN Apply 1 drop to eye daily.     09/18/2019 at Unknown time  . cholecalciferol (VITAMIN D) 1000 UNITS tablet Take 1,000 Units by mouth daily.   09/18/2019 at  Unknown time  . esomeprazole (NEXIUM) 40 MG capsule TAKE 1 CAPSULE BY MOUTH TWICE EVERY DAY (Patient taking differently: Take 40 mg by mouth daily. TAKE 1 CAPSULE BY MOUTH TWICE EVERY DAY) 60 capsule 12 09/17/2019 at Unknown time  . fluticasone (FLONASE) 50 MCG/ACT nasal spray Place 2 sprays into both nostrils daily.   09/17/2019 at Unknown time  . gabapentin (NEURONTIN) 100 MG capsule Take 1 capsule (100 mg total) by mouth 2 (two) times daily. (Patient taking differently: Take 100 mg by mouth 2 (two) times daily as needed. ) 60 capsule 3 prn at prn  . gabapentin (NEURONTIN) 300 MG capsule Take 1 capsule (300 mg total) by mouth 2 (two) times daily. (Patient taking differently: Take 300 mg by mouth 2 (two) times daily as needed. ) 60 capsule 3 prn at prn  . levocetirizine (XYZAL) 5 MG tablet Take 1 tablet (5 mg total) by mouth every evening. (Patient taking differently: Take 5 mg by mouth daily as needed. ) 30 tablet 12 prn at prn  . tamsulosin (FLOMAX) 0.4 MG CAPS capsule TAKE 1-2 CAPSULES (0.4- 0.8 MG TOTAL) BY  MOUTH DAILY. 60 capsule 12 09/17/2019 at Unknown time  . timolol (TIMOPTIC-XR) 0.5 % ophthalmic gel-forming Place 1 drop into the left eye every morning.   09/18/2019 at Unknown time  . vitamin C (ASCORBIC ACID) 500 MG tablet Take 500 mg by mouth daily.   09/18/2019 at Unknown time  . tiZANidine (ZANAFLEX) 2 MG tablet Take 1 tablet (2 mg total) by mouth every 8 (eight) hours as needed for muscle spasms. 90 tablet 3 prn at prn    Assessment: Pharmacy consulted to dose heparin in this 74 year old male admitted with DVT.  No prior anticoag noted.  CrCl = 69.4 ml/min  Goal of Therapy:  Heparin level 0.3-0.7 units/ml Monitor platelets by anticoagulation protocol: Yes   Plan:  01/28 @ 0300 HL 0.11 subtherapeutic. Will rebolus heparin 2400 units IV x 1 and increase rate to 1400 units/hr and will recheck HL @ 1300, CBC trended down will continue to monitor.  Tobie Lords, PharmD, BCPS Clinical  Pharmacist 09/19/2019,4:48 AM

## 2019-09-20 ENCOUNTER — Encounter: Payer: Self-pay | Admitting: Internal Medicine

## 2019-09-20 ENCOUNTER — Inpatient Hospital Stay
Admit: 2019-09-20 | Discharge: 2019-09-20 | Disposition: A | Discharge status: Home / Self Care | Payer: Medicare Other | Attending: Internal Medicine | Admitting: Internal Medicine

## 2019-09-20 ENCOUNTER — Inpatient Hospital Stay: Payer: Medicare Other

## 2019-09-20 ENCOUNTER — Other Ambulatory Visit: Payer: Self-pay

## 2019-09-20 LAB — HEPARIN LEVEL (UNFRACTIONATED)
Heparin Unfractionated: 0.38 IU/mL (ref 0.30–0.70)
Heparin Unfractionated: 0.43 IU/mL (ref 0.30–0.70)

## 2019-09-20 LAB — ECHOCARDIOGRAM COMPLETE
Height: 69 in
Weight: 2800 oz

## 2019-09-20 LAB — CBC
HCT: 34.5 % — ABNORMAL LOW (ref 39.0–52.0)
Hemoglobin: 11.5 g/dL — ABNORMAL LOW (ref 13.0–17.0)
MCH: 29.8 pg (ref 26.0–34.0)
MCHC: 33.3 g/dL (ref 30.0–36.0)
MCV: 89.4 fL (ref 80.0–100.0)
Platelets: 447 10*3/uL — ABNORMAL HIGH (ref 150–400)
RBC: 3.86 MIL/uL — ABNORMAL LOW (ref 4.22–5.81)
RDW: 14.5 % (ref 11.5–15.5)
WBC: 13.1 10*3/uL — ABNORMAL HIGH (ref 4.0–10.5)
nRBC: 0 % (ref 0.0–0.2)

## 2019-09-20 LAB — PSA: Prostatic Specific Antigen: 3.19 ng/mL (ref 0.00–4.00)

## 2019-09-20 MED ORDER — IOHEXOL 300 MG/ML  SOLN
75.0000 mL | Freq: Once | INTRAMUSCULAR | Status: AC | PRN
  Administered 2019-09-20: 75 mL via INTRAVENOUS

## 2019-09-20 MED ORDER — DILTIAZEM HCL 25 MG/5ML IV SOLN
10.0000 mg | INTRAVENOUS | Status: DC | PRN
  Filled 2019-09-20: qty 5

## 2019-09-20 NOTE — Plan of Care (Signed)
Continuing with plan of care. 

## 2019-09-20 NOTE — Progress Notes (Signed)
ANTICOAGULATION CONSULT NOTE  Pharmacy Consult for Heparin  Indication: DVT  Patient Measurements: Height: 5\' 9"  (175.3 cm) Weight: 175 lb (79.4 kg) IBW/kg (Calculated) : 70.7  Vital Signs: Temp: 98.9 F (37.2 C) (01/28 2024) Temp Source: Oral (01/28 2024) BP: 162/93 (01/28 2024) Pulse Rate: 73 (01/28 2024)  Labs: Recent Labs    09/18/19 1304 09/18/19 1826 09/19/19 0332 09/19/19 1323 09/20/19 0012  HGB 13.8  --  12.2*  --   --   HCT 41.9  --  36.1*  --   --   PLT 460*  --  425*  --   --   APTT  --  41*  --   --   --   LABPROT  --  14.7  --   --   --   INR  --  1.2  --   --   --   HEPARINUNFRC  --   --  0.11* 0.13* 0.38  CREATININE 0.92  --  0.96  --   --     Estimated Creatinine Clearance: 66.5 mL/min (by C-G formula based on SCr of 0.96 mg/dL).   Medical History: Past Medical History:  Diagnosis Date  . Allergy   . Anal fissure   . Anxiety   . Cancer ([place])    basal cell removed x4  . Cataract    bil eyes  . Depression   . Diverticulosis of colon   . GERD (gastroesophageal reflux disease)    esophageal narrowing  . Glaucoma   . Hiatal hernia   . IBS (irritable bowel syndrome)   . Insomnia   . Internal hemorrhoids     Medications:  Medications Prior to Admission  Medication Sig Dispense Refill Last Dose  . b complex vitamins capsule Take 1 capsule by mouth daily.   09/18/2019 at Unknown time  . Bimatoprost (LUMIGAN) 0.01 % SOLN Apply 1 drop to eye daily.     09/18/2019 at Unknown time  . cholecalciferol (VITAMIN D) 1000 UNITS tablet Take 1,000 Units by mouth daily.   09/18/2019 at Unknown time  . esomeprazole (NEXIUM) 40 MG capsule TAKE 1 CAPSULE BY MOUTH TWICE EVERY DAY (Patient taking differently: Take 40 mg by mouth daily. TAKE 1 CAPSULE BY MOUTH TWICE EVERY DAY) 60 capsule 12 09/17/2019 at Unknown time  . fluticasone (FLONASE) 50 MCG/ACT nasal spray Place 2 sprays into both nostrils daily.   09/17/2019 at Unknown time  . gabapentin (NEURONTIN) 100 MG  capsule Take 1 capsule (100 mg total) by mouth 2 (two) times daily. (Patient taking differently: Take 100 mg by mouth 2 (two) times daily as needed. ) 60 capsule 3 prn at prn  . gabapentin (NEURONTIN) 300 MG capsule Take 1 capsule (300 mg total) by mouth 2 (two) times daily. (Patient taking differently: Take 300 mg by mouth 2 (two) times daily as needed. ) 60 capsule 3 prn at prn  . levocetirizine (XYZAL) 5 MG tablet Take 1 tablet (5 mg total) by mouth every evening. (Patient taking differently: Take 5 mg by mouth daily as needed. ) 30 tablet 12 prn at prn  . tamsulosin (FLOMAX) 0.4 MG CAPS capsule TAKE 1-2 CAPSULES (0.4- 0.8 MG TOTAL) BY MOUTH DAILY. 60 capsule 12 09/17/2019 at Unknown time  . timolol (TIMOPTIC-XR) 0.5 % ophthalmic gel-forming Place 1 drop into the left eye every morning.   09/18/2019 at Unknown time  . vitamin C (ASCORBIC ACID) 500 MG tablet Take 500 mg by mouth daily.   09/18/2019 at  Unknown time  . tiZANidine (ZANAFLEX) 2 MG tablet Take 1 tablet (2 mg total) by mouth every 8 (eight) hours as needed for muscle spasms. 90 tablet 3 prn at prn    Assessment: Pharmacy consulted to dose heparin in this 74 year old male admitted with DVT.  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 No prior anticoag noted. Baseline labs: INR 1.2, aPTT 41s. H&H trending down, platelets elevated and stable  Goal of Therapy:  Heparin level 0.3-0.7 units/ml Monitor platelets by anticoagulation protocol: Yes   Heparin Course: 1/27 initiation: 5000 unit bolus, then 1200 units/hr 1/28 0332 HL 0.11: 2400 unit bolus, then inc to 1400 units/hr 1/28 1323 HL 0.13: 2400 unit bolus, then inc to 1700 units/hr   Plan:  01/29 @ 0000 HL 0.38 therapeutic. Will continue current rate and will recheck HL @ 0800, CBC trending down will continue to monitor.  Tobie Lords, PharmD, BCPS Clinical Pharmacist 09/20/2019,1:18 AM

## 2019-09-20 NOTE — Progress Notes (Signed)
PROGRESS NOTE    Mason [name]  [id number] DOB: 1946/06/14 DOA: 09/18/2019 PCP: Joaquim Nam, MD   Brief Narrative:  Mason [name]  is a 74 y.o. male,  w anxiety/ depression , gerd, diverticulosis, apparently presents with upper abdominal pain x 2 days.  Pt has had subjective fever for the past 10days, covid negative outpatient testing, and tx with doxycycline x 2 days.   Denies n/v, diarrhea, brbpr, black stool, cough, cp, palp, sob.  Pt presented due to abdominal pain.   1/28: Patient soundly sleeping on my arrival.  Appears comfortable.  Pain appears well controlled.  Started on Cipro/Flagyl for acute diverticulitis.  Etiology of portal vein thrombosis unclear.  Gastroenterology consulted requested further evaluation by hematology/oncology.  1/29: Patient overall stable.  Not endorsing pain but endorses severe fatigue.  Continues on ofloxacin/Flagyl for acute diverticulitis.  Seen by hematology yesterday.  Etiology of portal vein thrombosis is somewhat unclear.  Hematology recommending 3 to 6 months of anticoagulation.  Initial CT thorax as part of metastatic work-up was negative for metastatic disease.  Patient did have acute onset of intermittent tachycardia.  Appears as A. fib on telemetry.  Twelve-lead EKG ordered and is pending.   Assessment & Plan:   Principal Problem:   Diverticulitis Active Problems:   BPH (benign prostatic hyperplasia)   Portal vein thrombosis   Sepsis (HCC)   Abnormal liver function  Sepsis (fever, tachycardia, elevated wbc) Diverticulitis Clinical evidence of sepsis improving Leukocytosis improving Cultures no growth to date Continue Cipro/Flagyl for now GI consulted, appreciate recommendations Bowel rest, may be able to advance to full liquids today As needed pain control Fluid bolus as needed  Portal vein thrombosis Currently on heparin infusion Etiology rather unclear Could be related directly to inflammatory state secondary to  diverticulitis Per GI appearance on imaging may indicate some element of chronicity Question whether patient will truly require long-term anticoagulation Hematology consulted per GI, appreciate recommendations Hematology note appreciated Recommending 3 to 6 months of anticoagulation Transition to Eliquis on discharge Metastatic work-up ordered per hematology and will be followed outpatient CT thorax performed yesterday, negative for metastatic disease  Tachycardia, suspect atrial fibrillation Tachycardia and irregular rhythm noted on telemetry Twelve-lead EKG ordered and currently pending On heparin GTT for portal vein thrombosis 2D echocardiogram ordered  Abnormal liver function Trending down Hepatitis panel negative  ETOH dep CIWA protocol TOC consult  Gerd Cont PPI  Glaucoma Cont Timolol Cont Lumigan-> Xalatan  Bph Cont Flomax   DVT prophylaxis: Heparin GTT Code Status: Full Family Communication: None today  disposition Plan: Anticipate home, 1 to 2 days   Consultants:   GI-Wohl  Heme/onc-Finnegan  Procedures:   None  Antimicrobials:   Cipro (1/27-  )  Flagyl  (1/27-  )   Subjective: Seen and examined Tachycardia noted on telemetry Patient endorsing fatigue Vitals stable  Objective: Vitals:   09/19/19 2024 09/20/19 0621 09/20/19 0800 09/20/19 0914  BP: (!) 162/93 (!) 156/85 (!) 152/94 (!) 151/77  Pulse: 73 77 88 74  Resp: 18 18    Temp: 98.9 F (37.2 C) 98.9 F (37.2 C)  97.9 F (36.6 C)  TempSrc: Oral Oral  Oral  SpO2: 100% 96%  97%  Weight:      Height:        Intake/Output Summary (Last 24 hours) at 09/20/2019 1040 Last data filed at 09/20/2019 0630 Gross per 24 hour  Intake 2330.64 ml  Output 1200 ml  Net 1130.64 ml   Filed  Weights   09/18/19 1253  Weight: 79.4 kg    Examination:  General exam: Appears calm and comfortable  Respiratory system: Clear to auscultation. Respiratory effort normal. Cardiovascular  system: S1 & S2 heard, RRR. No JVD, murmurs, rubs, gallops or clicks. No pedal edema. Gastrointestinal system: Abdomen is nondistended, tenderness to palpation Central nervous system: Alert and oriented. No focal neurological deficits. Extremities: Symmetric 5 x 5 power. Skin: No rashes, lesions or ulcers Psychiatry: Judgement and insight appear normal. Mood & affect appropriate.     Data Reviewed: I have personally reviewed following labs and imaging studies  CBC: Recent Labs  Lab 09/18/19 1304 09/19/19 0332 09/20/19 0518  WBC 18.0* 18.2* 13.1*  NEUTROABS 13.9*  --   --   HGB 13.8 12.2* 11.5*  HCT 41.9 36.1* 34.5*  MCV 90.7 90.0 89.4  PLT 460* 425* 447*   Basic Metabolic Panel: Recent Labs  Lab 09/18/19 1304 09/19/19 0332  NA 137 136  K 3.8 4.0  CL 99 101  CO2 28 22  GLUCOSE 90 76  BUN 11 11  CREATININE 0.92 0.96  CALCIUM 8.7* 8.2*   GFR: Estimated Creatinine Clearance: 66.5 mL/min (by C-G formula based on SCr of 0.96 mg/dL). Liver Function Tests: Recent Labs  Lab 09/18/19 1304 09/19/19 0332  AST 26 26  ALT 53* 42  ALKPHOS 98 78  BILITOT 0.9 0.9  PROT 7.8 6.1*  ALBUMIN 3.3* 2.4*   Recent Labs  Lab 09/18/19 1304  LIPASE 22   No results for input(s): AMMONIA in the last 168 hours. Coagulation Profile: Recent Labs  Lab 09/18/19 1826  INR 1.2   Cardiac Enzymes: No results for input(s): CKTOTAL, CKMB, CKMBINDEX, TROPONINI in the last 168 hours. BNP (last 3 results) No results for input(s): PROBNP in the last 8760 hours. HbA1C: No results for input(s): HGBA1C in the last 72 hours. CBG: No results for input(s): GLUCAP in the last 168 hours. Lipid Profile: No results for input(s): CHOL, HDL, LDLCALC, TRIG, CHOLHDL, LDLDIRECT in the last 72 hours. Thyroid Function Tests: No results for input(s): TSH, T4TOTAL, FREET4, T3FREE, THYROIDAB in the last 72 hours. Anemia Panel: No results for input(s): VITAMINB12, FOLATE, FERRITIN, TIBC, IRON, RETICCTPCT  in the last 72 hours. Sepsis Labs: Recent Labs  Lab 09/18/19 1304  LATICACIDVEN 0.8    Recent Results (from the past 240 hour(s))  Blood culture (routine x 2)     Status: None (Preliminary result)   Collection Time: 09/18/19  1:05 PM   Specimen: BLOOD  Result Value Ref Range Status   Specimen Description BLOOD RIGHT ANTECUBITAL  Final   Special Requests   Final    BOTTLES DRAWN AEROBIC AND ANAEROBIC Blood Culture adequate volume   Culture   Final    NO GROWTH 2 DAYS Performed at [facility], [address], [place], [place] [address]    Report Status PENDING  Incomplete  Respiratory Panel by RT PCR (Flu A&B, Covid) - Nasopharyngeal Swab     Status: None   Collection Time: 09/18/19  4:50 PM   Specimen: Nasopharyngeal Swab  Result Value Ref Range Status   SARS Coronavirus 2 by RT PCR NEGATIVE NEGATIVE Final    Comment: (NOTE) SARS-CoV-2 target nucleic acids are NOT DETECTED. The SARS-CoV-2 RNA is generally detectable in upper respiratoy specimens during the acute phase of infection. The lowest concentration of SARS-CoV-2 viral copies this assay can detect is 131 copies/mL. A negative result does not preclude SARS-Cov-2 infection and should not be  used as the sole basis for treatment or other patient management decisions. A negative result may occur with  improper specimen collection/handling, submission of specimen other than nasopharyngeal swab, presence of viral mutation(s) within the areas targeted by this assay, and inadequate number of viral copies (<131 copies/mL). A negative result must be combined with clinical observations, patient history, and epidemiological information. The expected result is Negative. Fact Sheet for Patients:  https://www.moore.com/ Fact Sheet for Healthcare Providers:  https://www.young.biz/ This test is not yet ap proved or cleared by the [place] FDA and  has been authorized for detection  and/or diagnosis of SARS-CoV-2 by FDA under an Emergency Use Authorization (EUA). This EUA will remain  in effect (meaning this test can be used) for the duration of the COVID-19 declaration under Section 564(b)(1) of the Act, 21 U.S.C. section 360bbb-3(b)(1), unless the authorization is terminated or revoked sooner.    Influenza A by PCR NEGATIVE NEGATIVE Final   Influenza B by PCR NEGATIVE NEGATIVE Final    Comment: (NOTE) The Xpert Xpress SARS-CoV-2/FLU/RSV assay is intended as an aid in  the diagnosis of influenza from Nasopharyngeal swab specimens and  should not be used as a sole basis for treatment. Nasal washings and  aspirates are unacceptable for Xpert Xpress SARS-CoV-2/FLU/RSV  testing. Fact Sheet for Patients: https://www.moore.com/ Fact Sheet for Healthcare Providers: https://www.young.biz/ This test is not yet approved or cleared by the [place] FDA and  has been authorized for detection and/or diagnosis of SARS-CoV-2 by  FDA under an Emergency Use Authorization (EUA). This EUA will remain  in effect (meaning this test can be used) for the duration of the  Covid-19 declaration under Section 564(b)(1) of the Act, 21  U.S.C. section 360bbb-3(b)(1), unless the authorization is  terminated or revoked. Performed at [facility], [address], [place], [place] [address]   Blood culture (routine x 2)     Status: None (Preliminary result)   Collection Time: 09/18/19  9:19 PM   Specimen: BLOOD  Result Value Ref Range Status   Specimen Description BLOOD LEFT HAND  Final   Special Requests   Final    BOTTLES DRAWN AEROBIC AND ANAEROBIC Blood Culture adequate volume   Culture   Final    NO GROWTH 2 DAYS Performed at [facility], [address], [place], [place] [address]    Report Status PENDING  Incomplete         Radiology Studies: DG Chest 2 View  Result Date: 09/18/2019 CLINICAL DATA:  Unexplained  fever 10 days. EXAM: CHEST - 2 VIEW COMPARISON:  08/03/2018 FINDINGS: Lungs are adequately inflated and otherwise clear. Cardiomediastinal silhouette and remainder of the exam is unchanged. IMPRESSION: No active cardiopulmonary disease. Electronically Signed   By: Elberta Fortis M.D.   On: 09/18/2019 13:14   CT CHEST W CONTRAST  Result Date: 09/20/2019 CLINICAL DATA:  Portal vein thrombosis of undetermined etiology. Evaluate chest for malignancy EXAM: CT CHEST WITH CONTRAST TECHNIQUE: Multidetector CT imaging of the chest was performed during intravenous contrast administration. CONTRAST:  75mL OMNIPAQUE IOHEXOL 300 MG/ML  SOLN COMPARISON:  None. FINDINGS: Cardiovascular: Coronary artery calcification and aortic atherosclerotic calcification. Mediastinum/Nodes: No axillary or supraclavicular adenopathy. No mediastinal hilar adenopathy. No pericardial effusion. Esophagus normal. Lungs/Pleura: Small subpleural nodule in the anterior aspect of the RIGHT middle lobe measures 4 mm (image 106/3). Upper Abdomen: Low-density lesion in the LEFT hepatic lobe consistent with benign cyst. Heterogeneity of signal intensity in the LEFT hepatic lobe related  to portal vein thrombosis. Persistent LEFT portal vein thrombosis noted. Adrenal glands are normal. Musculoskeletal: No aggressive osseous lesion. IMPRESSION: 1. No evidence of malignancy in the thorax. 2. Small RIGHT lower lobe pulmonary nodule. No follow-up needed if patient is low-risk. Non-contrast chest CT can be considered in 12 months if patient is high-risk. This recommendation follows the consensus statement: Guidelines for Management of Incidental Pulmonary Nodules Detected on CT Images: From the Fleischner Society 2017; Radiology 2017; 284:228-243. 3. Persistent LEFT portal vein thrombosis. Electronically Signed   By: Genevive Bi M.D.   On: 09/20/2019 08:13   CT ABDOMEN PELVIS W CONTRAST  Result Date: 09/18/2019 CLINICAL DATA:  Fever for the last 10 days.  History of diverticulosis. Suspected abdominal infection. EXAM: CT ABDOMEN AND PELVIS WITH CONTRAST TECHNIQUE: Multidetector CT imaging of the abdomen and pelvis was performed using the standard protocol following bolus administration of intravenous contrast. CONTRAST:  OMNIPAQUE IOHEXOL 300 MG/ML  SOLN COMPARISON:  01/24/2007 FINDINGS: Lower chest: Normal Hepatobiliary: Chronic 1 cm cyst of the left lobe. Chronic 1 cm cyst at the ventral right lobe. Previously seen 1 cm cyst in the medial segment of the left lobe no longer visible. Geographic fatty change of the liver. More inhomogeneous fatty change of the left lobe. I think findings less likely represent liver infection or infiltrating tumor. There appears to be localized thrombosis of the left division of the portal vein. Pancreas: Normal Spleen: Normal Adrenals/Urinary Tract: Adrenal glands are normal. Kidneys are normal. Bladder is normal. Stomach/Bowel: No sign of bowel obstruction. Advanced diverticulosis of the sigmoid colon. Large diverticulum containing inspissated stool. Low level acute diverticulitis at the descending sigmoid junction. The patient has a giant diverticulum distal to that with a partially calcified stool ball. Vascular/Lymphatic: Aortic atherosclerosis. IVC is normal. No retroperitoneal adenopathy. Reproductive: Enlarged prostate. Other: No free fluid or air. Musculoskeletal: Lower lumbar chronic degenerative changes. IMPRESSION: Extensive diverticulosis in the sigmoid region. Acute diverticulitis at the descending sigmoid junction. No evidence of abscess or peritonitis. 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 Geographic fatty change of the liver. Heterogeneous appearance of the left lobe probably relates to geographic fatty change in possibly flow disturbance because of the localized portal vein thrombosis. Portal vein thrombosis can occur in the  setting of acute inflammatory processes. Electronically Signed   By: Paulina Fusi M.D.   On: 09/18/2019 17:40        Scheduled Meds: . fluticasone  2 spray Each Nare Daily  . folic acid  1 mg Oral Daily  . latanoprost  1 drop Both Eyes QHS  . loratadine  10 mg Oral Daily  . LORazepam  0-4 mg Intravenous Q6H   Followed by  . LORazepam  0-4 mg Intravenous Q12H  . multivitamin with minerals  1 tablet Oral Daily  . pantoprazole  40 mg Oral Daily  . sodium chloride flush  3 mL Intravenous Once  . tamsulosin  0.4 mg Oral QPC supper  . thiamine  100 mg Oral Daily   Or  . thiamine  100 mg Intravenous Daily  . timolol  1 drop Left Eye q morning - 10a   Continuous Infusions: . ciprofloxacin 400 mg (09/19/19 2133)  . heparin 1,700 Units/hr (09/20/19 0218)  . metronidazole 500 mg (09/20/19 0946)     LOS: 2 days    Time spent: 35 minutes    Tresa Moore, MD [facility] Pager  [phone]  If 7PM-7AM, please contact night-coverage [email or web address] 09/20/2019, 10:40 AM

## 2019-09-20 NOTE — Care Management Important Message (Signed)
Important Message  Patient Details  Name: ELAY TRANI MRN: [id number] Date of Birth: 08/05/1946   Medicare Important Message Given:  Yes     Dannette Barbara 09/20/2019, 11:12 AM

## 2019-09-20 NOTE — Progress Notes (Signed)
Resident had a previous episode of bigeminy. Dr. Priscella Mann notified and ordered EKG that has been obtained.  Vital Signs.

## 2019-09-20 NOTE — Progress Notes (Signed)
*  PRELIMINARY RESULTS* Echocardiogram 2D Echocardiogram has been performed.  Mason [name] 09/20/2019, 11:59 AM

## 2019-09-20 NOTE — Progress Notes (Signed)
Patient was having sinus rhythm earlier this morning with burst of A-fib, Dr. Priscella Mann notified and ordered EKG and prn medication.  EKG obtained and MD aware.  Will continue to monitor.

## 2019-09-20 NOTE — Progress Notes (Signed)
ANTICOAGULATION CONSULT NOTE  Pharmacy Consult for Heparin  Indication: DVT  Patient Measurements: Height: 5\' 9"  (175.3 cm) Weight: 175 lb (79.4 kg) IBW/kg (Calculated) : 70.7  Vital Signs: Temp: 97.9 F (36.6 C) (01/29 0914) Temp Source: Oral (01/29 0914) BP: 151/77 (01/29 0914) Pulse Rate: 74 (01/29 0914)  Labs: Recent Labs    09/18/19 1304 09/18/19 1304 09/18/19 1826 09/19/19 0332 09/19/19 0332 09/19/19 1323 09/20/19 0012 09/20/19 0518 09/20/19 0752  HGB 13.8   < >  --  12.2*  --   --   --  11.5*  --   HCT 41.9  --   --  36.1*  --   --   --  34.5*  --   PLT 460*  --   --  425*  --   --   --  447*  --   APTT  --   --  41*  --   --   --   --   --   --   LABPROT  --   --  14.7  --   --   --   --   --   --   INR  --   --  1.2  --   --   --   --   --   --   HEPARINUNFRC  --   --   --  0.11*   < > 0.13* 0.38  --  0.43  CREATININE 0.92  --   --  0.96  --   --   --   --   --    < > = values in this interval not displayed.    Estimated Creatinine Clearance: 66.5 mL/min (by C-G formula based on SCr of 0.96 mg/dL).   Assessment: Pharmacy consulted to dose heparin in this 74 year old male admitted with DVT.  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 No prior anticoag noted. Baseline labs: INR 1.2, aPTT 41s. H&H trending down, platelets elevated and stable  Goal of Therapy:  Heparin level 0.3-0.7 units/ml Monitor platelets by anticoagulation protocol: Yes   Heparin Course: 1/27 initiation: 5000 unit bolus, then 1200 units/hr 1/28 0332 HL 0.11: 2400 unit bolus, then inc to 1400 units/hr 1/28 1323 HL 0.13: 2400 unit bolus, then inc to 1700 units/hr 1/29 0012 HL 0.38 therapeutic, continue current rate   Plan:  01/29 @ 0752 HL 0.43 therapeutic x2. Will continue current rate and will recheck HL in AM with CBC, CBC trending down will continue to monitor.  Pharmacy will continue to follow.   Rayna Sexton, PharmD, BCPS Clinical Pharmacist 09/20/2019  9:30 AM

## 2019-09-20 NOTE — Consult Note (Signed)
Pharmacy Antibiotic Note  Mason [name] is a 74 y.o. male admitted on 09/18/2019 with Intra-abdominal infection.  Pharmacy has been consulted for ciprofloxacin dosing.  Plan: Ciprofloxacin 400 mg IV q12h   Height: 5\' 9"  (175.3 cm) Weight: 175 lb (79.4 kg) IBW/kg (Calculated) : 70.7  Temp (24hrs), Avg:98.6 F (37 C), Min:97.9 F (36.6 C), Max:98.9 F (37.2 C)  Recent Labs  Lab 09/18/19 1304 09/19/19 0332 09/20/19 0518  WBC 18.0* 18.2* 13.1*  CREATININE 0.92 0.96  --   LATICACIDVEN 0.8  --   --     Estimated Creatinine Clearance: 66.5 mL/min (by C-G formula based on SCr of 0.96 mg/dL).    Allergies  Allergen Reactions  . Chlordiazepoxide-Clidinium     REACTION: urinary retention  . Citalopram Hydrobromide     Intolerant, tried 03/2011.  "It made me very angry".  . Erythromycin Base     REACTION: stomach upset  . Hyoscyamine Sulfate     REACTION: urinary retention  . Meloxicam     GI upset  . Penicillins     REACTION: Itching, rash  . Shrimp [Shellfish Allergy]     And crab- rash, GI upset, upper airway symptoms    Antimicrobials this admission: 1/27 ciprofloxacin >>  1/27 flagyl >>   Dose adjustments this admission: None  Microbiology results: 1/27 BCx: NGTD  Thank you for allowing pharmacy to be a part of this patient's care.  Rocky Morel, PharmD, BCPS 09/20/2019 9:22 AM

## 2019-09-20 NOTE — TOC Initial Note (Signed)
Transition of Care [facility]) - Initial/Assessment Note    Patient Details  Name: Mason [name] MRN: [id number] Date of Birth: 02/29/1946  [facility]) CM/SW Contact:    Chapman Fitch, RN Phone Number: 09/20/2019, 4:12 PM  Clinical Narrative:                 Patient admitted from home with acute diverticulitis  Patient states that he lives at home alone Family lives in [place] and [place]  Patient states "the only person I have that lives locally is a handicap friend"   [facility]  Patient states at baseline he is independent and drives  Patient states that he now feels unsteady on his feet.  PT eval requested  TOC consult for ETOH use.  Patient denies alcohol abuse.  States that he has not drank in over a year.  Declines any substance abuse resources.   Expected Discharge Plan: Home/Self Care Barriers to Discharge: Continued Medical Work up   Patient Goals and CMS Choice Patient states their goals for this hospitalization and ongoing recovery are:: to get to feel better and go home      Expected Discharge Plan and Services Expected Discharge Plan: Home/Self Care       Living arrangements for the past 2 months: Single Family Home                                      Prior Living Arrangements/Services Living arrangements for the past 2 months: Single Family Home Lives with:: Self Patient language and need for interpreter reviewed:: Yes        Need for Family Participation in Patient Care: No (Comment) Care giver support system in place?: No (comment)   Criminal Activity/Legal Involvement Pertinent to Current Situation/Hospitalization: No - Comment as needed  Activities of Daily Living Home Assistive Devices/Equipment: Eyeglasses ADL Screening (condition at time of admission) Patient's cognitive ability adequate to safely complete daily activities?: Yes Is the patient deaf or have difficulty hearing?: No Does the patient  have difficulty seeing, even when wearing glasses/contacts?: No Does the patient have difficulty concentrating, remembering, or making decisions?: No Patient able to express need for assistance with ADLs?: Yes Does the patient have difficulty dressing or bathing?: No Independently performs ADLs?: Yes (appropriate for developmental age) Does the patient have difficulty walking or climbing stairs?: No Weakness of Legs: None Weakness of Arms/Hands: None  Permission Sought/Granted                  Emotional Assessment Appearance:: Appears stated age     Orientation: : Oriented to Self, Oriented to Place, Oriented to  Time, Oriented to Situation   Psych Involvement: No (comment)  Admission diagnosis:  Portal vein thrombosis [I81] Diverticulitis [K57.92] Acute diverticulitis of intestine [K57.92] Patient Active Problem List   Diagnosis Date Noted  . Fever 09/19/2019  . Diverticulitis 09/18/2019  . 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/18/2019  . Sepsis (HCC) 09/18/2019  . Abnormal liver function 09/18/2019  . 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 Degenerative disc disease, cervical 01/23/2019  . Skin irritation 10/28/2018  . Neuropathic pain 08/08/2018  . Tongue lesion 07/01/2018  . Mild concussion 05/23/2018  . Health care maintenance 06/25/2017  . Trochanteric bursitis 06/25/2017  . Low HDL (under 40) 05/24/2016  . Advance care planning 05/02/2014  . PSA elevation 05/02/2014  . BPH (benign prostatic hyperplasia) 12/10/2013  .  Erectile dysfunction 08/26/2013  . GERD (gastroesophageal reflux disease) 06/28/2013  . Knee pain, left anterior 04/29/2013  . Medicare annual wellness visit, subsequent 11/04/2011  . Depression 04/01/2011  . Insomnia 12/16/2010  . ANXIETY 12/15/2007  . PEPTIC STRICTURE 12/15/2007  . HIATAL HERNIA 12/15/2007  . DIVERTICULOSIS, COLON W/O HEM 12/23/2006  . IRRITABLE BOWEL SYNDROME 12/23/2006   PCP:  Joaquim Nam, MD Pharmacy:   [phone] [place], [place] - [address] [place] [address] Phone: [phone] Fax: [phone]  [id number] [place], [place] - [address] [place] [address] Phone: [phone] Fax: [phone]  [id number] - [place] [facility] [address] [place] [address] Phone: [phone] Fax: [phone]  [id number] [place], [place] - [address] [place] [address] Phone: [phone] Fax: [phone]  [facility] - [place], [place] - [id number], [address] [place] [address] Phone: [phone] Fax: [phone]     Social Determinants of Health (SDOH) Interventions    Readmission Risk Interventions No flowsheet data found.

## 2019-09-21 LAB — BASIC METABOLIC PANEL
Anion gap: 7 (ref 5–15)
BUN: 8 mg/dL (ref 8–23)
CO2: 26 mmol/L (ref 22–32)
Calcium: 7.8 mg/dL — ABNORMAL LOW (ref 8.9–10.3)
Chloride: 105 mmol/L (ref 98–111)
Creatinine, Ser: 0.9 mg/dL (ref 0.61–1.24)
GFR calc Af Amer: >60 mL/min (ref >60)
GFR calc non Af Amer: >60 mL/min (ref >60)
Glucose, Bld: 95 mg/dL (ref 70–99)
Potassium: 3.8 mmol/L (ref 3.5–5.1)
Sodium: 138 mmol/L (ref 135–145)

## 2019-09-21 LAB — HEPARIN LEVEL (UNFRACTIONATED): Heparin Unfractionated: 0.28 IU/mL — ABNORMAL LOW (ref 0.30–0.70)

## 2019-09-21 LAB — CEA: CEA: 1.6 ng/mL (ref 0.0–4.7)

## 2019-09-21 LAB — CBC
HCT: 34.8 % — ABNORMAL LOW (ref 39.0–52.0)
Hemoglobin: 11.6 g/dL — ABNORMAL LOW (ref 13.0–17.0)
MCH: 29.7 pg (ref 26.0–34.0)
MCHC: 33.3 g/dL (ref 30.0–36.0)
MCV: 89.2 fL (ref 80.0–100.0)
Platelets: 475 10*3/uL — ABNORMAL HIGH (ref 150–400)
RBC: 3.9 MIL/uL — ABNORMAL LOW (ref 4.22–5.81)
RDW: 14.6 % (ref 11.5–15.5)
WBC: 12 10*3/uL — ABNORMAL HIGH (ref 4.0–10.5)
nRBC: 0 % (ref 0.0–0.2)

## 2019-09-21 LAB — MAGNESIUM: Magnesium: 1.9 mg/dL (ref 1.7–2.4)

## 2019-09-21 LAB — CANCER ANTIGEN 19-9: CA 19-9: <2 U/mL (ref 0–35)

## 2019-09-21 MED ORDER — METRONIDAZOLE 500 MG PO TABS
500.0000 mg | ORAL_TABLET | Freq: Three times a day (TID) | ORAL | Status: DC
  Administered 2019-09-21 – 2019-09-22 (×4): 500 mg via ORAL
  Filled 2019-09-21 (×6): qty 1

## 2019-09-21 MED ORDER — SODIUM CHLORIDE 0.9 % IV SOLN
INTRAVENOUS | Status: DC | PRN
  Administered 2019-09-21: 250 mL via INTRAVENOUS

## 2019-09-21 MED ORDER — APIXABAN 5 MG PO TABS
10.0000 mg | ORAL_TABLET | Freq: Two times a day (BID) | ORAL | Status: DC
  Administered 2019-09-21 – 2019-09-22 (×3): 10 mg via ORAL
  Filled 2019-09-21 (×3): qty 2

## 2019-09-21 MED ORDER — APIXABAN 5 MG PO TABS: 5.0000 mg | ORAL_TABLET | Freq: Two times a day (BID) | ORAL | Status: DC

## 2019-09-21 NOTE — Progress Notes (Signed)
Coronary artery calcification and aortic atherosclerotic calcification. Mediastinum/Nodes: No axillary or supraclavicular adenopathy. No mediastinal hilar adenopathy. No pericardial effusion. Esophagus normal. Lungs/Pleura: Small subpleural nodule in the anterior aspect of the RIGHT middle lobe measures 4 mm (image 106/3). Upper Abdomen: Low-density lesion in the LEFT hepatic lobe consistent with benign cyst. Heterogeneity of signal intensity in the LEFT hepatic lobe related to portal vein thrombosis. Persistent LEFT portal vein thrombosis noted. Adrenal glands are normal. Musculoskeletal: No aggressive osseous lesion. IMPRESSION: 1. No evidence of malignancy in the thorax. 2. Small RIGHT lower lobe pulmonary nodule. No follow-up needed if patient is low-risk. Non-contrast chest CT can be considered in 12 months if patient is high-risk. This recommendation follows the consensus statement: Guidelines  for Management of Incidental Pulmonary Nodules Detected on CT Images: From the Fleischner Society 2017; Radiology 2017; 284:228-243. 3. Persistent LEFT portal vein thrombosis. Electronically Signed   By: Suzy Bouchard M.D.   On: 09/20/2019 08:13   ECHOCARDIOGRAM COMPLETE  Result Date: 09/20/2019   ECHOCARDIOGRAM REPORT   Patient Name:   Mason [name] [facility] Date of Exam: 09/20/2019 Medical Rec #:  [id number]         Height:       69.0 in Accession #:    [id number]        Weight:       175.0 lb Date of Birth:  03/30/1946        BSA:          1.95 m Patient Age:    74 years          BP:           151/77 mmHg Patient Gender: M                 HR:           108 bpm. Exam Location:  [facility] Procedure: 2D Echo, Color Doppler and Cardiac Doppler Indications:     I48.91 Atrial Fibrillation  History:         Patient has no prior history of Echocardiogram examinations. No                  medical history.  Sonographer:     Charmayne Sheer RDCS (AE) Referring Phys:  [id number] Sidney Ace Diagnosing Phys: Neoma Laming MD  Sonographer Comments: Suboptimal parasternal window. IMPRESSIONS  1. Left ventricular ejection fraction, by visual estimation, is 50 to 55%. The left ventricle has mildly decreased function. There is moderately increased left ventricular hypertrophy.  2. Left ventricular diastolic parameters are indeterminate.  3. The left ventricle demonstrates global hypokinesis.  4. Global right ventricle has normal systolic function.The right ventricular size is normal. No increase in right ventricular wall thickness.  5. Left atrial size was normal.  6. Right atrial size was normal.  7. The mitral valve is normal in structure. Trivial mitral valve regurgitation. No evidence of mitral stenosis.  8. The tricuspid valve is normal in structure.  9. The tricuspid valve is normal in structure. Tricuspid valve regurgitation is trivial. 10. The aortic valve is normal in structure. Aortic valve regurgitation is not visualized.  Mild aortic valve sclerosis without stenosis. 11. The pulmonic valve was normal in structure. Pulmonic valve regurgitation is not visualized. 12. The inferior vena cava is normal in size with greater than 50% respiratory variability, suggesting right atrial pressure of 3 mmHg. FINDINGS  Left Ventricle: Left ventricular ejection fraction, by visual estimation, is 50 to 55%. The left ventricle has  PROGRESS NOTE    [name]  [id number] DOB: [birth year] DOA: 09/18/2019 PCP: Tonia Ghent, MD   Brief Narrative:  Mason [name]  is a 74 y.o. male,  w anxiety/ depression , gerd, diverticulosis, apparently presents with upper abdominal pain x 2 days.  Pt has had subjective fever for the past 10days, covid negative outpatient testing, and tx with doxycycline x 2 days.   Denies n/v, diarrhea, brbpr, black stool, cough, cp, palp, sob.  Pt presented due to abdominal pain.   1/28: Patient soundly sleeping on my arrival.  Appears comfortable.  Pain appears well controlled.  Started on Cipro/Flagyl for acute diverticulitis.  Etiology of portal vein thrombosis unclear.  Gastroenterology consulted requested further evaluation by hematology/oncology.  1/29: Patient overall stable.  Not endorsing pain but endorses severe fatigue.  Continues on ofloxacin/Flagyl for acute diverticulitis.  Seen by hematology yesterday.  Etiology of portal vein thrombosis is somewhat unclear.  Hematology recommending 3 to 6 months of anticoagulation.  Initial CT thorax as part of metastatic work-up was negative for metastatic disease.  Patient did have acute onset of intermittent tachycardia.  Appears as A. fib on telemetry.  Twelve-lead EKG ordered and is pending.  1/30: Patient seen and examined.  Atrial fibrillation controlled on twelve-lead EKG.  Patient improving clinically.  Continues to endorse fatigue otherwise asymptomatic.  Echocardiogram reassuring.  No fevers noted over interval.  Remains on ciprofloxacin and metronidazole.  Remains on heparin infusion   Assessment & Plan:   Principal Problem:   Diverticulitis Active Problems:   BPH (benign prostatic hyperplasia)   Portal vein thrombosis   Sepsis (HCC)   Abnormal liver function  Sepsis (fever, tachycardia, elevated wbc) Diverticulitis Clinical evidence of sepsis improving Leukocytosis improving Cultures no growth to date Continue  Cipro/Flagyl for now GI consulted, appreciate recommendations Advance to soft diet As needed pain control Discharge anticipated within the next 24 to 48 hours  Portal vein thrombosis Currently on heparin infusion Etiology rather unclear Could be related directly to inflammatory state secondary to diverticulitis Per GI appearance on imaging may indicate some element of chronicity Question whether patient will truly require long-term anticoagulation Hematology consulted per GI, appreciate recommendations Hematology note appreciated Recommending 3 to 6 months of anticoagulation Metastatic work-up ordered per hematology and will be followed outpatient CT thorax performed yesterday, negative for metastatic disease Plan: Transition to Eliquis today, treatment dose, 10 mg twice daily x7 days followed by 5 mg twice daily indefinitely  Atrial fibrillation, new diagnosis Tachycardia and irregular rhythm noted on telemetry Twelve-lead EKG ordered, confirmed atrial fibrillation On heparin GTT for portal vein thrombosis Transition to Eliquis as patient needs treatment for portal vein thrombosis 2D echocardiogram, EF 50 to [id number], normal diastolic dysfunction, no valvular disease  Abnormal liver function Trending down Hepatitis panel negative  ETOH dep Patient has not had a drink in over a year No indication of clinical withdrawal DC CIWA protocol  Gerd Cont PPI  Glaucoma Cont Timolol Cont Lumigan-> Xalatan  Bph Cont Flomax   DVT prophylaxis: Eliquis Code Status: Full Family Communication: None today  disposition Plan: Anticipate home in 24 to 48 hours once patient tolerating p.o.   Consultants:   GI-Wohl  Heme/onc-Finnegan  Procedures:   None  Antimicrobials:   Cipro (1/27-  )  Flagyl  (1/27-  )   Subjective: Seen and examined Heart rate improved Vitals were stable Patient endorsing fatigue  Objective: Vitals:   09/20/19 1734 09/20/19 2050 09/21/19  0028 09/21/19 [id number]  Coronary artery calcification and aortic atherosclerotic calcification. Mediastinum/Nodes: No axillary or supraclavicular adenopathy. No mediastinal hilar adenopathy. No pericardial effusion. Esophagus normal. Lungs/Pleura: Small subpleural nodule in the anterior aspect of the RIGHT middle lobe measures 4 mm (image 106/3). Upper Abdomen: Low-density lesion in the LEFT hepatic lobe consistent with benign cyst. Heterogeneity of signal intensity in the LEFT hepatic lobe related to portal vein thrombosis. Persistent LEFT portal vein thrombosis noted. Adrenal glands are normal. Musculoskeletal: No aggressive osseous lesion. IMPRESSION: 1. No evidence of malignancy in the thorax. 2. Small RIGHT lower lobe pulmonary nodule. No follow-up needed if patient is low-risk. Non-contrast chest CT can be considered in 12 months if patient is high-risk. This recommendation follows the consensus statement: Guidelines  for Management of Incidental Pulmonary Nodules Detected on CT Images: From the Fleischner Society 2017; Radiology 2017; 284:228-243. 3. Persistent LEFT portal vein thrombosis. Electronically Signed   By: Suzy Bouchard M.D.   On: 09/20/2019 08:13   ECHOCARDIOGRAM COMPLETE  Result Date: 09/20/2019   ECHOCARDIOGRAM REPORT   Patient Name:   Mason [name] [facility] Date of Exam: 09/20/2019 Medical Rec #:  [id number]         Height:       69.0 in Accession #:    [id number]        Weight:       175.0 lb Date of Birth:  03/30/1946        BSA:          1.95 m Patient Age:    74 years          BP:           151/77 mmHg Patient Gender: M                 HR:           108 bpm. Exam Location:  [facility] Procedure: 2D Echo, Color Doppler and Cardiac Doppler Indications:     I48.91 Atrial Fibrillation  History:         Patient has no prior history of Echocardiogram examinations. No                  medical history.  Sonographer:     Charmayne Sheer RDCS (AE) Referring Phys:  [id number] Sidney Ace Diagnosing Phys: Neoma Laming MD  Sonographer Comments: Suboptimal parasternal window. IMPRESSIONS  1. Left ventricular ejection fraction, by visual estimation, is 50 to 55%. The left ventricle has mildly decreased function. There is moderately increased left ventricular hypertrophy.  2. Left ventricular diastolic parameters are indeterminate.  3. The left ventricle demonstrates global hypokinesis.  4. Global right ventricle has normal systolic function.The right ventricular size is normal. No increase in right ventricular wall thickness.  5. Left atrial size was normal.  6. Right atrial size was normal.  7. The mitral valve is normal in structure. Trivial mitral valve regurgitation. No evidence of mitral stenosis.  8. The tricuspid valve is normal in structure.  9. The tricuspid valve is normal in structure. Tricuspid valve regurgitation is trivial. 10. The aortic valve is normal in structure. Aortic valve regurgitation is not visualized.  Mild aortic valve sclerosis without stenosis. 11. The pulmonic valve was normal in structure. Pulmonic valve regurgitation is not visualized. 12. The inferior vena cava is normal in size with greater than 50% respiratory variability, suggesting right atrial pressure of 3 mmHg. FINDINGS  Left Ventricle: Left ventricular ejection fraction, by visual estimation, is 50 to 55%. The left ventricle has  Coronary artery calcification and aortic atherosclerotic calcification. Mediastinum/Nodes: No axillary or supraclavicular adenopathy. No mediastinal hilar adenopathy. No pericardial effusion. Esophagus normal. Lungs/Pleura: Small subpleural nodule in the anterior aspect of the RIGHT middle lobe measures 4 mm (image 106/3). Upper Abdomen: Low-density lesion in the LEFT hepatic lobe consistent with benign cyst. Heterogeneity of signal intensity in the LEFT hepatic lobe related to portal vein thrombosis. Persistent LEFT portal vein thrombosis noted. Adrenal glands are normal. Musculoskeletal: No aggressive osseous lesion. IMPRESSION: 1. No evidence of malignancy in the thorax. 2. Small RIGHT lower lobe pulmonary nodule. No follow-up needed if patient is low-risk. Non-contrast chest CT can be considered in 12 months if patient is high-risk. This recommendation follows the consensus statement: Guidelines  for Management of Incidental Pulmonary Nodules Detected on CT Images: From the Fleischner Society 2017; Radiology 2017; 284:228-243. 3. Persistent LEFT portal vein thrombosis. Electronically Signed   By: Suzy Bouchard M.D.   On: 09/20/2019 08:13   ECHOCARDIOGRAM COMPLETE  Result Date: 09/20/2019   ECHOCARDIOGRAM REPORT   Patient Name:   Mason [name] [facility] Date of Exam: 09/20/2019 Medical Rec #:  [id number]         Height:       69.0 in Accession #:    [id number]        Weight:       175.0 lb Date of Birth:  03/30/1946        BSA:          1.95 m Patient Age:    74 years          BP:           151/77 mmHg Patient Gender: M                 HR:           108 bpm. Exam Location:  [facility] Procedure: 2D Echo, Color Doppler and Cardiac Doppler Indications:     I48.91 Atrial Fibrillation  History:         Patient has no prior history of Echocardiogram examinations. No                  medical history.  Sonographer:     Charmayne Sheer RDCS (AE) Referring Phys:  [id number] Sidney Ace Diagnosing Phys: Neoma Laming MD  Sonographer Comments: Suboptimal parasternal window. IMPRESSIONS  1. Left ventricular ejection fraction, by visual estimation, is 50 to 55%. The left ventricle has mildly decreased function. There is moderately increased left ventricular hypertrophy.  2. Left ventricular diastolic parameters are indeterminate.  3. The left ventricle demonstrates global hypokinesis.  4. Global right ventricle has normal systolic function.The right ventricular size is normal. No increase in right ventricular wall thickness.  5. Left atrial size was normal.  6. Right atrial size was normal.  7. The mitral valve is normal in structure. Trivial mitral valve regurgitation. No evidence of mitral stenosis.  8. The tricuspid valve is normal in structure.  9. The tricuspid valve is normal in structure. Tricuspid valve regurgitation is trivial. 10. The aortic valve is normal in structure. Aortic valve regurgitation is not visualized.  Mild aortic valve sclerosis without stenosis. 11. The pulmonic valve was normal in structure. Pulmonic valve regurgitation is not visualized. 12. The inferior vena cava is normal in size with greater than 50% respiratory variability, suggesting right atrial pressure of 3 mmHg. FINDINGS  Left Ventricle: Left ventricular ejection fraction, by visual estimation, is 50 to 55%. The left ventricle has  PROGRESS NOTE    [name]  [id number] DOB: [birth year] DOA: 09/18/2019 PCP: Tonia Ghent, MD   Brief Narrative:  Mason [name]  is a 74 y.o. male,  w anxiety/ depression , gerd, diverticulosis, apparently presents with upper abdominal pain x 2 days.  Pt has had subjective fever for the past 10days, covid negative outpatient testing, and tx with doxycycline x 2 days.   Denies n/v, diarrhea, brbpr, black stool, cough, cp, palp, sob.  Pt presented due to abdominal pain.   1/28: Patient soundly sleeping on my arrival.  Appears comfortable.  Pain appears well controlled.  Started on Cipro/Flagyl for acute diverticulitis.  Etiology of portal vein thrombosis unclear.  Gastroenterology consulted requested further evaluation by hematology/oncology.  1/29: Patient overall stable.  Not endorsing pain but endorses severe fatigue.  Continues on ofloxacin/Flagyl for acute diverticulitis.  Seen by hematology yesterday.  Etiology of portal vein thrombosis is somewhat unclear.  Hematology recommending 3 to 6 months of anticoagulation.  Initial CT thorax as part of metastatic work-up was negative for metastatic disease.  Patient did have acute onset of intermittent tachycardia.  Appears as A. fib on telemetry.  Twelve-lead EKG ordered and is pending.  1/30: Patient seen and examined.  Atrial fibrillation controlled on twelve-lead EKG.  Patient improving clinically.  Continues to endorse fatigue otherwise asymptomatic.  Echocardiogram reassuring.  No fevers noted over interval.  Remains on ciprofloxacin and metronidazole.  Remains on heparin infusion   Assessment & Plan:   Principal Problem:   Diverticulitis Active Problems:   BPH (benign prostatic hyperplasia)   Portal vein thrombosis   Sepsis (HCC)   Abnormal liver function  Sepsis (fever, tachycardia, elevated wbc) Diverticulitis Clinical evidence of sepsis improving Leukocytosis improving Cultures no growth to date Continue  Cipro/Flagyl for now GI consulted, appreciate recommendations Advance to soft diet As needed pain control Discharge anticipated within the next 24 to 48 hours  Portal vein thrombosis Currently on heparin infusion Etiology rather unclear Could be related directly to inflammatory state secondary to diverticulitis Per GI appearance on imaging may indicate some element of chronicity Question whether patient will truly require long-term anticoagulation Hematology consulted per GI, appreciate recommendations Hematology note appreciated Recommending 3 to 6 months of anticoagulation Metastatic work-up ordered per hematology and will be followed outpatient CT thorax performed yesterday, negative for metastatic disease Plan: Transition to Eliquis today, treatment dose, 10 mg twice daily x7 days followed by 5 mg twice daily indefinitely  Atrial fibrillation, new diagnosis Tachycardia and irregular rhythm noted on telemetry Twelve-lead EKG ordered, confirmed atrial fibrillation On heparin GTT for portal vein thrombosis Transition to Eliquis as patient needs treatment for portal vein thrombosis 2D echocardiogram, EF 50 to [id number], normal diastolic dysfunction, no valvular disease  Abnormal liver function Trending down Hepatitis panel negative  ETOH dep Patient has not had a drink in over a year No indication of clinical withdrawal DC CIWA protocol  Gerd Cont PPI  Glaucoma Cont Timolol Cont Lumigan-> Xalatan  Bph Cont Flomax   DVT prophylaxis: Eliquis Code Status: Full Family Communication: None today  disposition Plan: Anticipate home in 24 to 48 hours once patient tolerating p.o.   Consultants:   GI-Wohl  Heme/onc-Finnegan  Procedures:   None  Antimicrobials:   Cipro (1/27-  )  Flagyl  (1/27-  )   Subjective: Seen and examined Heart rate improved Vitals were stable Patient endorsing fatigue  Objective: Vitals:   09/20/19 1734 09/20/19 2050 09/21/19  0028 09/21/19 [id number]  Coronary artery calcification and aortic atherosclerotic calcification. Mediastinum/Nodes: No axillary or supraclavicular adenopathy. No mediastinal hilar adenopathy. No pericardial effusion. Esophagus normal. Lungs/Pleura: Small subpleural nodule in the anterior aspect of the RIGHT middle lobe measures 4 mm (image 106/3). Upper Abdomen: Low-density lesion in the LEFT hepatic lobe consistent with benign cyst. Heterogeneity of signal intensity in the LEFT hepatic lobe related to portal vein thrombosis. Persistent LEFT portal vein thrombosis noted. Adrenal glands are normal. Musculoskeletal: No aggressive osseous lesion. IMPRESSION: 1. No evidence of malignancy in the thorax. 2. Small RIGHT lower lobe pulmonary nodule. No follow-up needed if patient is low-risk. Non-contrast chest CT can be considered in 12 months if patient is high-risk. This recommendation follows the consensus statement: Guidelines  for Management of Incidental Pulmonary Nodules Detected on CT Images: From the Fleischner Society 2017; Radiology 2017; 284:228-243. 3. Persistent LEFT portal vein thrombosis. Electronically Signed   By: Suzy Bouchard M.D.   On: 09/20/2019 08:13   ECHOCARDIOGRAM COMPLETE  Result Date: 09/20/2019   ECHOCARDIOGRAM REPORT   Patient Name:   Mason [name] [facility] Date of Exam: 09/20/2019 Medical Rec #:  [id number]         Height:       69.0 in Accession #:    [id number]        Weight:       175.0 lb Date of Birth:  03/30/1946        BSA:          1.95 m Patient Age:    74 years          BP:           151/77 mmHg Patient Gender: M                 HR:           108 bpm. Exam Location:  [facility] Procedure: 2D Echo, Color Doppler and Cardiac Doppler Indications:     I48.91 Atrial Fibrillation  History:         Patient has no prior history of Echocardiogram examinations. No                  medical history.  Sonographer:     Charmayne Sheer RDCS (AE) Referring Phys:  [id number] Sidney Ace Diagnosing Phys: Neoma Laming MD  Sonographer Comments: Suboptimal parasternal window. IMPRESSIONS  1. Left ventricular ejection fraction, by visual estimation, is 50 to 55%. The left ventricle has mildly decreased function. There is moderately increased left ventricular hypertrophy.  2. Left ventricular diastolic parameters are indeterminate.  3. The left ventricle demonstrates global hypokinesis.  4. Global right ventricle has normal systolic function.The right ventricular size is normal. No increase in right ventricular wall thickness.  5. Left atrial size was normal.  6. Right atrial size was normal.  7. The mitral valve is normal in structure. Trivial mitral valve regurgitation. No evidence of mitral stenosis.  8. The tricuspid valve is normal in structure.  9. The tricuspid valve is normal in structure. Tricuspid valve regurgitation is trivial. 10. The aortic valve is normal in structure. Aortic valve regurgitation is not visualized.  Mild aortic valve sclerosis without stenosis. 11. The pulmonic valve was normal in structure. Pulmonic valve regurgitation is not visualized. 12. The inferior vena cava is normal in size with greater than 50% respiratory variability, suggesting right atrial pressure of 3 mmHg. FINDINGS  Left Ventricle: Left ventricular ejection fraction, by visual estimation, is 50 to 55%. The left ventricle has

## 2019-09-21 NOTE — Progress Notes (Signed)
ANTICOAGULATION CONSULT NOTE  Pharmacy Consult for Heparin  Indication: DVT  Patient Measurements: Height: 5\' 9"  (175.3 cm) Weight: 175 lb (79.4 kg) IBW/kg (Calculated) : 70.7  Vital Signs: Temp: 98.9 F (37.2 C) (01/30 0526) Temp Source: Oral (01/30 0526) BP: 147/83 (01/30 0526) Pulse Rate: 75 (01/30 0526)  Labs: Recent Labs    09/18/19 1304 09/18/19 1304 09/18/19 1826 09/19/19 0332 09/19/19 0332 09/19/19 1323 09/20/19 0012 09/20/19 0518 09/20/19 0752 09/21/19 0536  HGB 13.8   < >  --  12.2*   < >  --   --  11.5*  --  11.6*  HCT 41.9   < >  --  36.1*  --   --   --  34.5*  --  34.8*  PLT 460*   < >  --  425*  --   --   --  447*  --  475*  APTT  --   --  41*  --   --   --   --   --   --   --   LABPROT  --   --  14.7  --   --   --   --   --   --   --   INR  --   --  1.2  --   --   --   --   --   --   --   HEPARINUNFRC  --   --   --  0.11*  --    < > 0.38  --  0.43 0.28*  CREATININE 0.92  --   --  0.96  --   --   --   --   --   --    < > = values in this interval not displayed.    Estimated Creatinine Clearance: 66.5 mL/min (by C-G formula based on SCr of 0.96 mg/dL).   Assessment: Pharmacy consulted to dose heparin in this 74 year old male admitted with DVT.  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 No prior anticoag noted. Baseline labs: INR 1.2, aPTT 41s. H&H trending down, platelets elevated and stable  Goal of Therapy:  Heparin level 0.3-0.7 units/ml Monitor platelets by anticoagulation protocol: Yes   Heparin Course: 1/27 initiation: 5000 unit bolus, then 1200 units/hr 1/28 0332 HL 0.11: 2400 unit bolus, then inc to 1400 units/hr 1/28 1323 HL 0.13: 2400 unit bolus, then inc to 1700 units/hr 1/29 0012 HL 0.38 therapeutic, continue current rate   Plan:  01/30 @ 0530 HL 0.28 subtherapeutic. Will increase rate to 1800 units/hr and will recheck at 1500, CBC trending down will continue to monitor.  Tobie Lords, PharmD, BCPS Clinical  Pharmacist 09/21/2019 7:21 AM

## 2019-09-21 NOTE — Progress Notes (Signed)
ANTICOAGULATION CONSULT NOTE  Pharmacy Consult for apixaban Indication: DVT  Patient Measurements: Height: 5\' 9"  (175.3 cm) Weight: 175 lb (79.4 kg) IBW/kg (Calculated) : 70.7  Vital Signs: Temp: 98.9 F (37.2 C) (01/30 0526) Temp Source: Oral (01/30 0526) BP: 147/83 (01/30 0526) Pulse Rate: 75 (01/30 0526)  Labs: Recent Labs    09/18/19 1304 09/18/19 1304 09/18/19 1826 09/19/19 0332 09/19/19 0332 09/19/19 1323 09/20/19 0012 09/20/19 0518 09/20/19 0752 09/21/19 0536  HGB 13.8   < >  --  12.2*   < >  --   --  11.5*  --  11.6*  HCT 41.9   < >  --  36.1*  --   --   --  34.5*  --  34.8*  PLT 460*   < >  --  425*  --   --   --  447*  --  475*  APTT  --   --  41*  --   --   --   --   --   --   --   LABPROT  --   --  14.7  --   --   --   --   --   --   --   INR  --   --  1.2  --   --   --   --   --   --   --   HEPARINUNFRC  --   --   --  0.11*  --    < > 0.38  --  0.43 0.28*  CREATININE 0.92  --   --  0.96  --   --   --   --   --  0.90   < > = values in this interval not displayed.    Estimated Creatinine Clearance: 70.9 mL/min (by C-G formula based on SCr of 0.9 mg/dL).   Assessment: Pharmacy consulted to dose heparin in this 74 year old male admitted with DVT.  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 No prior anticoag noted. Baseline labs: INR 1.2, aPTT 41s. H&H lower than baseline but stable, platelets elevated and stable. He has been on IV heparin since 09/18/19 but the first therapeutic level was on 09/20/19  Goal of Therapy:  Monitor platelets by anticoagulation protocol: Yes    Plan:   Stop heparin infusion  One hour after heparin is stopped, start apixaban 10 mg twice daily for 7 days, then 5 mg twice daily thereafter   CBC at least every 3 days per protocol  Vallery Sa, PharmD, BCPS Clinical Pharmacist 09/21/2019 9:11 AM

## 2019-09-21 NOTE — Progress Notes (Signed)
The patient has been evaluated by hematology and has follow-up with them. The patient is on appropriate antibiotics for his diverticulitis. Nothing further to do from a GI point of view.  I will sign off.  Please call if any further GI concerns or questions.  We would like to thank you for the opportunity to participate in the care of Mason [name].

## 2019-09-22 MED ORDER — METRONIDAZOLE 500 MG PO TABS: 500.0000 mg | ORAL_TABLET | Freq: Three times a day (TID) | ORAL | 0 refills | Status: AC

## 2019-09-22 MED ORDER — CIPROFLOXACIN HCL 500 MG PO TABS: 500.0000 mg | ORAL_TABLET | Freq: Two times a day (BID) | ORAL | 0 refills | Status: AC

## 2019-09-22 MED ORDER — DICYCLOMINE HCL 10 MG PO CAPS: 10.0000 mg | ORAL_CAPSULE | Freq: Three times a day (TID) | ORAL | 0 refills | Status: DC | PRN

## 2019-09-22 MED ORDER — APIXABAN 5 MG PO TABS: ORAL_TABLET | ORAL | 2 refills | Status: DC
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
## 2019-09-22 NOTE — Progress Notes (Signed)
PROGRESS NOTE    Mason [name]  WUJ:[id number] DOB: Nov 30, 1945 DOA: 09/18/2019 PCP: Joaquim Nam, MD   Brief Narrative:  Mason [name]  is a 74 y.o. male,  w anxiety/ depression , gerd, diverticulosis, apparently presents with upper abdominal pain x 2 days.  Pt has had subjective fever for the past 10days, covid negative outpatient testing, and tx with doxycycline x 2 days.   Denies n/v, diarrhea, brbpr, black stool, cough, cp, palp, sob.  Pt presented due to abdominal pain.   1/28: Patient soundly sleeping on my arrival.  Appears comfortable.  Pain appears well controlled.  Started on Cipro/Flagyl for acute diverticulitis.  Etiology of portal vein thrombosis unclear.  Gastroenterology consulted requested further evaluation by hematology/oncology.  1/29: Patient overall stable.  Not endorsing pain but endorses severe fatigue.  Continues on ofloxacin/Flagyl for acute diverticulitis.  Seen by hematology yesterday.  Etiology of portal vein thrombosis is somewhat unclear.  Hematology recommending 3 to 6 months of anticoagulation.  Initial CT thorax as part of metastatic work-up was negative for metastatic disease.  Patient did have acute onset of intermittent tachycardia.  Appears as A. fib on telemetry.  Twelve-lead EKG ordered and is pending.  1/30: Patient seen and examined.  Atrial fibrillation controlled on twelve-lead EKG.  Patient improving clinically.  Continues to endorse fatigue otherwise asymptomatic.  Echocardiogram reassuring.  No fevers noted over interval.  Remains on ciprofloxacin and metronidazole.  Remains on heparin infusion  1/31: Seen and examined.  Improving clinically.  Fatigue improving.  No fevers over interval.  Remains on ciprofloxacin and metronidazole.  On Eliquis.  Tolerating full liquid diet  Assessment & Plan:   Principal Problem:   Diverticulitis Active Problems:   BPH (benign prostatic hyperplasia)   Portal vein thrombosis   Sepsis (HCC)   Abnormal  liver function  Sepsis (fever, tachycardia, elevated wbc) Diverticulitis Clinical evidence of sepsis improving Leukocytosis improving Cultures no growth to date Continue Cipro/Flagyl for now, plan for 10-day total course GI consulted, appreciate recommendations Advance regular diet as tolerated As needed pain control Patient stable for discharge within the next 24 hours, possibly today if patient tolerates regular diet without issue  Portal vein thrombosis Etiology rather unclear Could be related directly to inflammatory state secondary to diverticulitis Per GI appearance on imaging may indicate some element of chronicity Question whether patient will truly require long-term anticoagulation Hematology consulted per GI, appreciate recommendations Hematology note appreciated Recommending 3 to 6 months of anticoagulation Metastatic work-up ordered per hematology and will be followed outpatient CT thorax performed, negative for metastatic disease Hypercoagulability work-up to be performed as outpatient with hematology Plan: Continue Eliquis treatment dose, 10 mg twice daily x7 days followed by 5 mg twice daily indefinitely Follow-up outpatient with Dr. Orlie Dakin, appointment at cancer center to 1221 at 1015  Atrial fibrillation, new diagnosis Tachycardia and irregular rhythm noted on telemetry Twelve-lead EKG ordered, confirmed atrial fibrillation 2D echocardiogram, EF 50 to 55%, normal diastolic dysfunction, no valvular disease Continue Eliquis as above Outpatient referral to cardiology  Abnormal liver function Trending down Hepatitis panel negative  ETOH dep Patient has not had a drink in over a year No indication of clinical withdrawal DC CIWA protocol  Gerd Cont PPI  Glaucoma Cont Timolol Cont Lumigan-> Xalatan  Bph Cont Flomax   DVT prophylaxis: Eliquis Code Status: Full Family Communication: None today  disposition Plan: Anticipate home in 24 to 48  hours once patient tolerating p.o.   Consultants:   GI-Wohl  Heme/onc-Finnegan  Procedures:   None  Antimicrobials:   Cipro (1/27-  )  Flagyl  (1/27-  )   Subjective: Seen and examined Heart rate improved Vitals were stable Fatigue improving Tolerating mechanically altered diet  Objective: Vitals:   09/21/19 1150 09/21/19 2107 09/22/19 0500 09/22/19 0519  BP: 136/87 (!) 152/84  138/89  Pulse: 72 83  78  Resp: 18 20  16   Temp: 98.4 F (36.9 C) 98.7 F (37.1 C)  98.5 F (36.9 C)  TempSrc: Oral Oral  Oral  SpO2: 95% 98%  94%  Weight:   81.6 kg   Height:        Intake/Output Summary (Last 24 hours) at 09/22/2019 1253 Last data filed at 09/22/2019 1130 Gross per 24 hour  Intake 425.29 ml  Output 2150 ml  Net -1724.71 ml   Filed Weights   09/18/19 1253 09/22/19 0500  Weight: 79.4 kg 81.6 kg    Examination:  General exam: Appears calm and comfortable  Respiratory system: Clear to auscultation. Respiratory effort normal. Cardiovascular system: S1 & S2 heard, RRR. No JVD, murmurs, rubs, gallops or clicks. No pedal edema. Gastrointestinal system: Abdomen is nondistended, tenderness to palpation Central nervous system: Alert and oriented. No focal neurological deficits. Extremities: Symmetric 5 x 5 power. Skin: No rashes, lesions or ulcers Psychiatry: Judgement and insight appear normal. Mood & affect appropriate.     Data Reviewed: I have personally reviewed following labs and imaging studies  CBC: Recent Labs  Lab 09/18/19 1304 09/19/19 0332 09/20/19 0518 09/21/19 0536  WBC 18.0* 18.2* 13.1* 12.0*  NEUTROABS 13.9*  --   --   --   HGB 13.8 12.2* 11.5* 11.6*  HCT 41.9 36.1* 34.5* 34.8*  MCV 90.7 90.0 89.4 89.2  PLT 460* 425* 447* 475*   Basic Metabolic Panel: Recent Labs  Lab 09/18/19 1304 09/19/19 0332 09/21/19 0536  NA 137 136 138  K 3.8 4.0 3.8  CL 99 101 105  CO2 28 22 26   GLUCOSE 90 76 95  BUN 11 11 8   CREATININE 0.92 0.96 0.90   CALCIUM 8.7* 8.2* 7.8*  MG  --   --  1.9   GFR: Estimated Creatinine Clearance: 70.9 mL/min (by C-G formula based on SCr of 0.9 mg/dL). Liver Function Tests: Recent Labs  Lab 09/18/19 1304 09/19/19 0332  AST 26 26  ALT 53* 42  ALKPHOS 98 78  BILITOT 0.9 0.9  PROT 7.8 6.1*  ALBUMIN 3.3* 2.4*   Recent Labs  Lab 09/18/19 1304  LIPASE 22   No results for input(s): AMMONIA in the last 168 hours. Coagulation Profile: Recent Labs  Lab 09/18/19 1826  INR 1.2   Cardiac Enzymes: No results for input(s): CKTOTAL, CKMB, CKMBINDEX, TROPONINI in the last 168 hours. BNP (last 3 results) No results for input(s): PROBNP in the last 8760 hours. HbA1C: No results for input(s): HGBA1C in the last 72 hours. CBG: No results for input(s): GLUCAP in the last 168 hours. Lipid Profile: No results for input(s): CHOL, HDL, LDLCALC, TRIG, CHOLHDL, LDLDIRECT in the last 72 hours. Thyroid Function Tests: No results for input(s): TSH, T4TOTAL, FREET4, T3FREE, THYROIDAB in the last 72 hours. Anemia Panel: No results for input(s): VITAMINB12, FOLATE, FERRITIN, TIBC, IRON, RETICCTPCT in the last 72 hours. Sepsis Labs: Recent Labs  Lab 09/18/19 1304  LATICACIDVEN 0.8    Recent Results (from the past 240 hour(s))  Blood culture (routine x 2)     Status: None (Preliminary result)   Collection  Time: 09/18/19  1:05 PM   Specimen: BLOOD  Result Value Ref Range Status   Specimen Description BLOOD RIGHT ANTECUBITAL  Final   Special Requests   Final    BOTTLES DRAWN AEROBIC AND ANAEROBIC Blood Culture adequate volume   Culture   Final    NO GROWTH 4 DAYS Performed at [facility], [address] [place], [place] [address]    Report Status PENDING  Incomplete  Respiratory Panel by RT PCR (Flu A&B, Covid) - Nasopharyngeal Swab     Status: None   Collection Time: 09/18/19  4:50 PM   Specimen: Nasopharyngeal Swab  Result Value Ref Range Status   SARS Coronavirus 2 by RT PCR NEGATIVE  NEGATIVE Final    Comment: (NOTE) SARS-CoV-2 target nucleic acids are NOT DETECTED. The SARS-CoV-2 RNA is generally detectable in upper respiratoy specimens during the acute phase of infection. The lowest concentration of SARS-CoV-2 viral copies this assay can detect is 131 copies/mL. A negative result does not preclude SARS-Cov-2 infection and should not be used as the sole basis for treatment or other patient management decisions. A negative result may occur with  improper specimen collection/handling, submission of specimen other than nasopharyngeal swab, presence of viral mutation(s) within the areas targeted by this assay, and inadequate number of viral copies (<131 copies/mL). A negative result must be combined with clinical observations, patient history, and epidemiological information. The expected result is Negative. Fact Sheet for Patients:  https://www.moore.com/ Fact Sheet for Healthcare Providers:  https://www.young.biz/ This test is not yet ap proved or cleared by the [place] FDA and  has been authorized for detection and/or diagnosis of SARS-CoV-2 by FDA under an Emergency Use Authorization (EUA). This EUA will remain  in effect (meaning this test can be used) for the duration of the COVID-19 declaration under Section 564(b)(1) of the Act, 21 U.S.C. section 360bbb-3(b)(1), unless the authorization is terminated or revoked sooner.    Influenza A by PCR NEGATIVE NEGATIVE Final   Influenza B by PCR NEGATIVE NEGATIVE Final    Comment: (NOTE) The Xpert Xpress SARS-CoV-2/FLU/RSV assay is intended as an aid in  the diagnosis of influenza from Nasopharyngeal swab specimens and  should not be used as a sole basis for treatment. Nasal washings and  aspirates are unacceptable for Xpert Xpress SARS-CoV-2/FLU/RSV  testing. Fact Sheet for Patients: https://www.moore.com/ Fact Sheet for Healthcare  Providers: https://www.young.biz/ This test is not yet approved or cleared by the [place] FDA and  has been authorized for detection and/or diagnosis of SARS-CoV-2 by  FDA under an Emergency Use Authorization (EUA). This EUA will remain  in effect (meaning this test can be used) for the duration of the  Covid-19 declaration under Section 564(b)(1) of the Act, 21  U.S.C. section 360bbb-3(b)(1), unless the authorization is  terminated or revoked. Performed at [facility], [address] [place], [place] [address]   Blood culture (routine x 2)     Status: None (Preliminary result)   Collection Time: 09/18/19  9:19 PM   Specimen: BLOOD  Result Value Ref Range Status   Specimen Description BLOOD LEFT HAND  Final   Special Requests   Final    BOTTLES DRAWN AEROBIC AND ANAEROBIC Blood Culture adequate volume   Culture   Final    NO GROWTH 4 DAYS Performed at [facility], [address], [place], [place] [address]    Report Status PENDING  Incomplete         Radiology  Studies: No results found.      Scheduled Meds: . apixaban  10 mg Oral BID   Followed by  . [START ON 09/28/2019] apixaban  5 mg Oral BID  . fluticasone  2 spray Each Nare Daily  . folic acid  1 mg Oral Daily  . latanoprost  1 drop Both Eyes QHS  . loratadine  10 mg Oral Daily  . metroNIDAZOLE  500 mg Oral Q8H  . multivitamin with minerals  1 tablet Oral Daily  . pantoprazole  40 mg Oral Daily  . sodium chloride flush  3 mL Intravenous Once  . tamsulosin  0.4 mg Oral QPC supper  . thiamine  100 mg Oral Daily   Or  . thiamine  100 mg Intravenous Daily  . timolol  1 drop Left Eye q morning - 10a   Continuous Infusions: . sodium chloride Stopped (09/21/19 0812)  . sodium chloride Stopped (09/21/19 0811)  . ciprofloxacin 400 mg (09/22/19 0924)     LOS: 4 days    Time spent: 35 minutes    Tresa Moore, MD [facility] Pager [phone]  If  7PM-7AM, please contact night-coverage [email or web address] 09/22/2019, 12:53 PM

## 2019-09-22 NOTE — Progress Notes (Signed)
Patient with no obvious evidence of malignancy.  Portal vein thrombosis etiology remains unclear, but possibly either related to inflammatory state secondary to diverticulitis or chronic in nature based on hepatic imaging.  Will complete full hypercoagulable work-up for completeness as outpatient.  Patient being transitioned to Eliquis and will require 3 to 6 months of treatment.  Patient has follow-up in the [place] on October 04, 2019 at 10:15 AM.  Call with questions.

## 2019-09-22 NOTE — Discharge Summary (Signed)
Physician Discharge Summary  Mason [name] [id number] DOB: March 26, 1946 DOA: 09/18/2019  PCP: Tonia Ghent, MD  Admit date: 09/18/2019 Discharge date: 09/22/2019  Admitted From: Home Disposition:  Home with home health  Recommendations for Outpatient Follow-up:  1. Follow up with PCP in 1-2 weeks 2. Follow up with Dr. Grayland Ormond from oncology 10/04/19 at 10:15AM  Home Health:Yes, home PT Equipment/Devices:None  Discharge Condition:Stable CODE STATUS:Full Diet recommendation: Heart Healthy  Brief/Interim Summary: [name] a73 y.o.male,w anxiety/ depression , gerd, diverticulosis, apparently presents with upper abdominal pain x 2 days. Pt has had subjective fever for the past 10days, covid negative outpatient testing, and tx with doxycycline x 2 days. Denies n/v, diarrhea, brbpr, black stool, cough, cp, palp, sob. Pt presented due to abdominal pain.   1/28: Patient soundly sleeping on my arrival.  Appears comfortable.  Pain appears well controlled.  Started on Cipro/Flagyl for acute diverticulitis.  Etiology of portal vein thrombosis unclear.  Gastroenterology consulted requested further evaluation by hematology/oncology.  1/29: Patient overall stable.  Not endorsing pain but endorses severe fatigue.  Continues on ofloxacin/Flagyl for acute diverticulitis.  Seen by hematology yesterday.  Etiology of portal vein thrombosis is somewhat unclear.  Hematology recommending 3 to 6 months of anticoagulation.  Initial CT thorax as part of metastatic work-up was negative for metastatic disease.  Patient did have acute onset of intermittent tachycardia.  Appears as A. fib on telemetry.  Twelve-lead EKG ordered and is pending.  1/30: Patient seen and examined.  Atrial fibrillation controlled on twelve-lead EKG.  Patient improving clinically.  Continues to endorse fatigue otherwise asymptomatic.  Echocardiogram reassuring.  No fevers noted over interval.  Remains on  ciprofloxacin and metronidazole.  Remains on heparin infusion  1/31: Seen and examined.  Improving clinically.  Fatigue improving.  No fevers over interval.  Remains on ciprofloxacin and metronidazole.  On Eliquis.  Tolerating soft diet.  Stable for discharge home.  Complete 10 day course of cipro/flagyl.  Bentyl PRN for stomach cramping.  Eliquis starter pack prescribed.  Discharge Diagnoses:  Principal Problem:   Diverticulitis Active Problems:   BPH (benign prostatic hyperplasia)   Portal vein thrombosis   Sepsis (HCC)   Abnormal liver function  Sepsis (fever, tachycardia, elevated wbc) Diverticulitis Clinical evidence of sepsis improving Leukocytosis improving Cultures no growth to date Continue Cipro/Flagyl for now, plan for 10-day total course GI consulted, appreciate recommendations Advance regular diet as tolerated As needed pain control Patient medically stable for discharge home  Portal vein thrombosis Etiology rather unclear Could be related directly to inflammatory state secondary to diverticulitis Per GI appearance on imaging may indicate some element of chronicity Question whether patient will truly require long-term anticoagulation Hematology consulted per GI, appreciate recommendations Hematology note appreciated Recommending 3 to 6 months of anticoagulation Metastatic work-up ordered per hematology and will be followed outpatient CT thorax performed, negative for metastatic disease Hypercoagulability work-up to be performed as outpatient with hematology Plan: Continue Eliquis treatment dose, 10 mg twice daily x7 days followed by 5 mg twice daily indefinitely Follow-up outpatient with Dr. Grayland Ormond, appointment at cancer center to 1221 at 1015  Atrial fibrillation, new diagnosis Tachycardia and irregular rhythm noted on telemetry Twelve-lead EKG ordered, confirmed atrial fibrillation 2D echocardiogram, EF 50 to XX123456, normal diastolic dysfunction, no valvular  disease Continue Eliquis as above Outpatient referral to cardiology  Abnormal liver function Trending down Hepatitis panel negative  ETOH dep Patient has not had a drink in over a year No indication of clinical withdrawal  Physician Discharge Summary  Mason [name] [id number] DOB: March 26, 1946 DOA: 09/18/2019  PCP: Tonia Ghent, MD  Admit date: 09/18/2019 Discharge date: 09/22/2019  Admitted From: Home Disposition:  Home with home health  Recommendations for Outpatient Follow-up:  1. Follow up with PCP in 1-2 weeks 2. Follow up with Dr. Grayland Ormond from oncology 10/04/19 at 10:15AM  Home Health:Yes, home PT Equipment/Devices:None  Discharge Condition:Stable CODE STATUS:Full Diet recommendation: Heart Healthy  Brief/Interim Summary: [name] a73 y.o.male,w anxiety/ depression , gerd, diverticulosis, apparently presents with upper abdominal pain x 2 days. Pt has had subjective fever for the past 10days, covid negative outpatient testing, and tx with doxycycline x 2 days. Denies n/v, diarrhea, brbpr, black stool, cough, cp, palp, sob. Pt presented due to abdominal pain.   1/28: Patient soundly sleeping on my arrival.  Appears comfortable.  Pain appears well controlled.  Started on Cipro/Flagyl for acute diverticulitis.  Etiology of portal vein thrombosis unclear.  Gastroenterology consulted requested further evaluation by hematology/oncology.  1/29: Patient overall stable.  Not endorsing pain but endorses severe fatigue.  Continues on ofloxacin/Flagyl for acute diverticulitis.  Seen by hematology yesterday.  Etiology of portal vein thrombosis is somewhat unclear.  Hematology recommending 3 to 6 months of anticoagulation.  Initial CT thorax as part of metastatic work-up was negative for metastatic disease.  Patient did have acute onset of intermittent tachycardia.  Appears as A. fib on telemetry.  Twelve-lead EKG ordered and is pending.  1/30: Patient seen and examined.  Atrial fibrillation controlled on twelve-lead EKG.  Patient improving clinically.  Continues to endorse fatigue otherwise asymptomatic.  Echocardiogram reassuring.  No fevers noted over interval.  Remains on  ciprofloxacin and metronidazole.  Remains on heparin infusion  1/31: Seen and examined.  Improving clinically.  Fatigue improving.  No fevers over interval.  Remains on ciprofloxacin and metronidazole.  On Eliquis.  Tolerating soft diet.  Stable for discharge home.  Complete 10 day course of cipro/flagyl.  Bentyl PRN for stomach cramping.  Eliquis starter pack prescribed.  Discharge Diagnoses:  Principal Problem:   Diverticulitis Active Problems:   BPH (benign prostatic hyperplasia)   Portal vein thrombosis   Sepsis (HCC)   Abnormal liver function  Sepsis (fever, tachycardia, elevated wbc) Diverticulitis Clinical evidence of sepsis improving Leukocytosis improving Cultures no growth to date Continue Cipro/Flagyl for now, plan for 10-day total course GI consulted, appreciate recommendations Advance regular diet as tolerated As needed pain control Patient medically stable for discharge home  Portal vein thrombosis Etiology rather unclear Could be related directly to inflammatory state secondary to diverticulitis Per GI appearance on imaging may indicate some element of chronicity Question whether patient will truly require long-term anticoagulation Hematology consulted per GI, appreciate recommendations Hematology note appreciated Recommending 3 to 6 months of anticoagulation Metastatic work-up ordered per hematology and will be followed outpatient CT thorax performed, negative for metastatic disease Hypercoagulability work-up to be performed as outpatient with hematology Plan: Continue Eliquis treatment dose, 10 mg twice daily x7 days followed by 5 mg twice daily indefinitely Follow-up outpatient with Dr. Grayland Ormond, appointment at cancer center to 1221 at 1015  Atrial fibrillation, new diagnosis Tachycardia and irregular rhythm noted on telemetry Twelve-lead EKG ordered, confirmed atrial fibrillation 2D echocardiogram, EF 50 to [id number], normal diastolic dysfunction, no valvular  disease Continue Eliquis as above Outpatient referral to cardiology  Abnormal liver function Trending down Hepatitis panel negative  ETOH dep Patient has not had a drink in over a year No indication of clinical withdrawal  Physician Discharge Summary  Mason [name] [id number] DOB: March 26, 1946 DOA: 09/18/2019  PCP: Tonia Ghent, MD  Admit date: 09/18/2019 Discharge date: 09/22/2019  Admitted From: Home Disposition:  Home with home health  Recommendations for Outpatient Follow-up:  1. Follow up with PCP in 1-2 weeks 2. Follow up with Dr. Grayland Ormond from oncology 10/04/19 at 10:15AM  Home Health:Yes, home PT Equipment/Devices:None  Discharge Condition:Stable CODE STATUS:Full Diet recommendation: Heart Healthy  Brief/Interim Summary: [name] a73 y.o.male,w anxiety/ depression , gerd, diverticulosis, apparently presents with upper abdominal pain x 2 days. Pt has had subjective fever for the past 10days, covid negative outpatient testing, and tx with doxycycline x 2 days. Denies n/v, diarrhea, brbpr, black stool, cough, cp, palp, sob. Pt presented due to abdominal pain.   1/28: Patient soundly sleeping on my arrival.  Appears comfortable.  Pain appears well controlled.  Started on Cipro/Flagyl for acute diverticulitis.  Etiology of portal vein thrombosis unclear.  Gastroenterology consulted requested further evaluation by hematology/oncology.  1/29: Patient overall stable.  Not endorsing pain but endorses severe fatigue.  Continues on ofloxacin/Flagyl for acute diverticulitis.  Seen by hematology yesterday.  Etiology of portal vein thrombosis is somewhat unclear.  Hematology recommending 3 to 6 months of anticoagulation.  Initial CT thorax as part of metastatic work-up was negative for metastatic disease.  Patient did have acute onset of intermittent tachycardia.  Appears as A. fib on telemetry.  Twelve-lead EKG ordered and is pending.  1/30: Patient seen and examined.  Atrial fibrillation controlled on twelve-lead EKG.  Patient improving clinically.  Continues to endorse fatigue otherwise asymptomatic.  Echocardiogram reassuring.  No fevers noted over interval.  Remains on  ciprofloxacin and metronidazole.  Remains on heparin infusion  1/31: Seen and examined.  Improving clinically.  Fatigue improving.  No fevers over interval.  Remains on ciprofloxacin and metronidazole.  On Eliquis.  Tolerating soft diet.  Stable for discharge home.  Complete 10 day course of cipro/flagyl.  Bentyl PRN for stomach cramping.  Eliquis starter pack prescribed.  Discharge Diagnoses:  Principal Problem:   Diverticulitis Active Problems:   BPH (benign prostatic hyperplasia)   Portal vein thrombosis   Sepsis (HCC)   Abnormal liver function  Sepsis (fever, tachycardia, elevated wbc) Diverticulitis Clinical evidence of sepsis improving Leukocytosis improving Cultures no growth to date Continue Cipro/Flagyl for now, plan for 10-day total course GI consulted, appreciate recommendations Advance regular diet as tolerated As needed pain control Patient medically stable for discharge home  Portal vein thrombosis Etiology rather unclear Could be related directly to inflammatory state secondary to diverticulitis Per GI appearance on imaging may indicate some element of chronicity Question whether patient will truly require long-term anticoagulation Hematology consulted per GI, appreciate recommendations Hematology note appreciated Recommending 3 to 6 months of anticoagulation Metastatic work-up ordered per hematology and will be followed outpatient CT thorax performed, negative for metastatic disease Hypercoagulability work-up to be performed as outpatient with hematology Plan: Continue Eliquis treatment dose, 10 mg twice daily x7 days followed by 5 mg twice daily indefinitely Follow-up outpatient with Dr. Grayland Ormond, appointment at cancer center to 1221 at 1015  Atrial fibrillation, new diagnosis Tachycardia and irregular rhythm noted on telemetry Twelve-lead EKG ordered, confirmed atrial fibrillation 2D echocardiogram, EF 50 to [id number], normal diastolic dysfunction, no valvular  disease Continue Eliquis as above Outpatient referral to cardiology  Abnormal liver function Trending down Hepatitis panel negative  ETOH dep Patient has not had a drink in over a year No indication of clinical withdrawal  lobe no longer visible. Geographic fatty change of the liver. More inhomogeneous fatty change of the left lobe. I think findings less likely represent liver infection or infiltrating tumor. There appears to be localized thrombosis of the left division of the portal vein. Pancreas: Normal Spleen: Normal Adrenals/Urinary Tract: Adrenal glands are normal. Kidneys are normal. Bladder is normal. Stomach/Bowel: No sign of bowel obstruction. Advanced diverticulosis of the sigmoid colon. Large diverticulum containing inspissated stool. Low level acute diverticulitis at the descending sigmoid junction. The patient has a giant diverticulum distal to that with a partially calcified stool ball. Vascular/Lymphatic: Aortic atherosclerosis. IVC is normal. No  retroperitoneal adenopathy. Reproductive: Enlarged prostate. Other: No free fluid or air. Musculoskeletal: Lower lumbar chronic degenerative changes. IMPRESSION: Extensive diverticulosis in the sigmoid region. Acute diverticulitis at the descending sigmoid junction. No evidence of abscess or peritonitis. 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 Geographic fatty change of the liver. Heterogeneous appearance of the left lobe probably relates to geographic fatty change in possibly flow disturbance because of the localized portal vein thrombosis. Portal vein thrombosis can occur in the setting of acute inflammatory processes. Electronically Signed   By: Nelson Chimes M.D.   On: 09/18/2019 17:40   ECHOCARDIOGRAM COMPLETE  Result Date: 09/20/2019   ECHOCARDIOGRAM REPORT   Patient Name:   Mason [name] [facility] [place] Date of Exam: 09/20/2019 Medical Rec #:  [id number]         Height:       69.0 in Accession #:    [id number]        Weight:       175.0 lb Date of Birth:  [birth year]        BSA:          1.95 m Patient Age:    71 years          BP:           151/77 mmHg Patient Gender: M                 HR:           108 bpm. Exam Location:  [facility] Procedure: 2D Echo, Color Doppler and Cardiac Doppler Indications:     I48.91 Atrial Fibrillation  History:         Patient has no prior history of Echocardiogram examinations. No                  medical history.  Sonographer:     Charmayne Sheer RDCS (AE) Referring Phys:  [id number] Sidney Ace Diagnosing Phys: Neoma Laming MD  Sonographer Comments: Suboptimal parasternal window. IMPRESSIONS  1. Left ventricular ejection fraction, by visual estimation, is 50 to 55%. The left ventricle has mildly decreased function. There is moderately increased left ventricular hypertrophy.  2. Left ventricular diastolic parameters are indeterminate.  3. The left ventricle demonstrates global hypokinesis.  4. Global right  ventricle has normal systolic function.The right ventricular size is normal. No increase in right ventricular wall thickness.  5. Left atrial size was normal.  6. Right atrial size was normal.  7. The mitral valve is normal in structure. Trivial mitral valve regurgitation. No evidence of mitral stenosis.  8. The tricuspid valve is normal in structure.  9. The tricuspid valve is normal in structure. Tricuspid valve regurgitation is trivial. 10. The aortic valve is normal in structure. Aortic valve regurgitation is not visualized. Mild aortic valve sclerosis without  lobe no longer visible. Geographic fatty change of the liver. More inhomogeneous fatty change of the left lobe. I think findings less likely represent liver infection or infiltrating tumor. There appears to be localized thrombosis of the left division of the portal vein. Pancreas: Normal Spleen: Normal Adrenals/Urinary Tract: Adrenal glands are normal. Kidneys are normal. Bladder is normal. Stomach/Bowel: No sign of bowel obstruction. Advanced diverticulosis of the sigmoid colon. Large diverticulum containing inspissated stool. Low level acute diverticulitis at the descending sigmoid junction. The patient has a giant diverticulum distal to that with a partially calcified stool ball. Vascular/Lymphatic: Aortic atherosclerosis. IVC is normal. No  retroperitoneal adenopathy. Reproductive: Enlarged prostate. Other: No free fluid or air. Musculoskeletal: Lower lumbar chronic degenerative changes. IMPRESSION: Extensive diverticulosis in the sigmoid region. Acute diverticulitis at the descending sigmoid junction. No evidence of abscess or peritonitis. 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 Geographic fatty change of the liver. Heterogeneous appearance of the left lobe probably relates to geographic fatty change in possibly flow disturbance because of the localized portal vein thrombosis. Portal vein thrombosis can occur in the setting of acute inflammatory processes. Electronically Signed   By: Nelson Chimes M.D.   On: 09/18/2019 17:40   ECHOCARDIOGRAM COMPLETE  Result Date: 09/20/2019   ECHOCARDIOGRAM REPORT   Patient Name:   Mason [name] [facility] [place] Date of Exam: 09/20/2019 Medical Rec #:  [id number]         Height:       69.0 in Accession #:    [id number]        Weight:       175.0 lb Date of Birth:  [birth year]        BSA:          1.95 m Patient Age:    71 years          BP:           151/77 mmHg Patient Gender: M                 HR:           108 bpm. Exam Location:  [facility] Procedure: 2D Echo, Color Doppler and Cardiac Doppler Indications:     I48.91 Atrial Fibrillation  History:         Patient has no prior history of Echocardiogram examinations. No                  medical history.  Sonographer:     Charmayne Sheer RDCS (AE) Referring Phys:  [id number] Sidney Ace Diagnosing Phys: Neoma Laming MD  Sonographer Comments: Suboptimal parasternal window. IMPRESSIONS  1. Left ventricular ejection fraction, by visual estimation, is 50 to 55%. The left ventricle has mildly decreased function. There is moderately increased left ventricular hypertrophy.  2. Left ventricular diastolic parameters are indeterminate.  3. The left ventricle demonstrates global hypokinesis.  4. Global right  ventricle has normal systolic function.The right ventricular size is normal. No increase in right ventricular wall thickness.  5. Left atrial size was normal.  6. Right atrial size was normal.  7. The mitral valve is normal in structure. Trivial mitral valve regurgitation. No evidence of mitral stenosis.  8. The tricuspid valve is normal in structure.  9. The tricuspid valve is normal in structure. Tricuspid valve regurgitation is trivial. 10. The aortic valve is normal in structure. Aortic valve regurgitation is not visualized. Mild aortic valve sclerosis without  Physician Discharge Summary  Mason [name] [id number] DOB: March 26, 1946 DOA: 09/18/2019  PCP: Tonia Ghent, MD  Admit date: 09/18/2019 Discharge date: 09/22/2019  Admitted From: Home Disposition:  Home with home health  Recommendations for Outpatient Follow-up:  1. Follow up with PCP in 1-2 weeks 2. Follow up with Dr. Grayland Ormond from oncology 10/04/19 at 10:15AM  Home Health:Yes, home PT Equipment/Devices:None  Discharge Condition:Stable CODE STATUS:Full Diet recommendation: Heart Healthy  Brief/Interim Summary: [name] a73 y.o.male,w anxiety/ depression , gerd, diverticulosis, apparently presents with upper abdominal pain x 2 days. Pt has had subjective fever for the past 10days, covid negative outpatient testing, and tx with doxycycline x 2 days. Denies n/v, diarrhea, brbpr, black stool, cough, cp, palp, sob. Pt presented due to abdominal pain.   1/28: Patient soundly sleeping on my arrival.  Appears comfortable.  Pain appears well controlled.  Started on Cipro/Flagyl for acute diverticulitis.  Etiology of portal vein thrombosis unclear.  Gastroenterology consulted requested further evaluation by hematology/oncology.  1/29: Patient overall stable.  Not endorsing pain but endorses severe fatigue.  Continues on ofloxacin/Flagyl for acute diverticulitis.  Seen by hematology yesterday.  Etiology of portal vein thrombosis is somewhat unclear.  Hematology recommending 3 to 6 months of anticoagulation.  Initial CT thorax as part of metastatic work-up was negative for metastatic disease.  Patient did have acute onset of intermittent tachycardia.  Appears as A. fib on telemetry.  Twelve-lead EKG ordered and is pending.  1/30: Patient seen and examined.  Atrial fibrillation controlled on twelve-lead EKG.  Patient improving clinically.  Continues to endorse fatigue otherwise asymptomatic.  Echocardiogram reassuring.  No fevers noted over interval.  Remains on  ciprofloxacin and metronidazole.  Remains on heparin infusion  1/31: Seen and examined.  Improving clinically.  Fatigue improving.  No fevers over interval.  Remains on ciprofloxacin and metronidazole.  On Eliquis.  Tolerating soft diet.  Stable for discharge home.  Complete 10 day course of cipro/flagyl.  Bentyl PRN for stomach cramping.  Eliquis starter pack prescribed.  Discharge Diagnoses:  Principal Problem:   Diverticulitis Active Problems:   BPH (benign prostatic hyperplasia)   Portal vein thrombosis   Sepsis (HCC)   Abnormal liver function  Sepsis (fever, tachycardia, elevated wbc) Diverticulitis Clinical evidence of sepsis improving Leukocytosis improving Cultures no growth to date Continue Cipro/Flagyl for now, plan for 10-day total course GI consulted, appreciate recommendations Advance regular diet as tolerated As needed pain control Patient medically stable for discharge home  Portal vein thrombosis Etiology rather unclear Could be related directly to inflammatory state secondary to diverticulitis Per GI appearance on imaging may indicate some element of chronicity Question whether patient will truly require long-term anticoagulation Hematology consulted per GI, appreciate recommendations Hematology note appreciated Recommending 3 to 6 months of anticoagulation Metastatic work-up ordered per hematology and will be followed outpatient CT thorax performed, negative for metastatic disease Hypercoagulability work-up to be performed as outpatient with hematology Plan: Continue Eliquis treatment dose, 10 mg twice daily x7 days followed by 5 mg twice daily indefinitely Follow-up outpatient with Dr. Grayland Ormond, appointment at cancer center to 1221 at 1015  Atrial fibrillation, new diagnosis Tachycardia and irregular rhythm noted on telemetry Twelve-lead EKG ordered, confirmed atrial fibrillation 2D echocardiogram, EF 50 to [id number], normal diastolic dysfunction, no valvular  disease Continue Eliquis as above Outpatient referral to cardiology  Abnormal liver function Trending down Hepatitis panel negative  ETOH dep Patient has not had a drink in over a year No indication of clinical withdrawal  Physician Discharge Summary  Mason [name] [id number] DOB: March 26, 1946 DOA: 09/18/2019  PCP: Tonia Ghent, MD  Admit date: 09/18/2019 Discharge date: 09/22/2019  Admitted From: Home Disposition:  Home with home health  Recommendations for Outpatient Follow-up:  1. Follow up with PCP in 1-2 weeks 2. Follow up with Dr. Grayland Ormond from oncology 10/04/19 at 10:15AM  Home Health:Yes, home PT Equipment/Devices:None  Discharge Condition:Stable CODE STATUS:Full Diet recommendation: Heart Healthy  Brief/Interim Summary: [name] a73 y.o.male,w anxiety/ depression , gerd, diverticulosis, apparently presents with upper abdominal pain x 2 days. Pt has had subjective fever for the past 10days, covid negative outpatient testing, and tx with doxycycline x 2 days. Denies n/v, diarrhea, brbpr, black stool, cough, cp, palp, sob. Pt presented due to abdominal pain.   1/28: Patient soundly sleeping on my arrival.  Appears comfortable.  Pain appears well controlled.  Started on Cipro/Flagyl for acute diverticulitis.  Etiology of portal vein thrombosis unclear.  Gastroenterology consulted requested further evaluation by hematology/oncology.  1/29: Patient overall stable.  Not endorsing pain but endorses severe fatigue.  Continues on ofloxacin/Flagyl for acute diverticulitis.  Seen by hematology yesterday.  Etiology of portal vein thrombosis is somewhat unclear.  Hematology recommending 3 to 6 months of anticoagulation.  Initial CT thorax as part of metastatic work-up was negative for metastatic disease.  Patient did have acute onset of intermittent tachycardia.  Appears as A. fib on telemetry.  Twelve-lead EKG ordered and is pending.  1/30: Patient seen and examined.  Atrial fibrillation controlled on twelve-lead EKG.  Patient improving clinically.  Continues to endorse fatigue otherwise asymptomatic.  Echocardiogram reassuring.  No fevers noted over interval.  Remains on  ciprofloxacin and metronidazole.  Remains on heparin infusion  1/31: Seen and examined.  Improving clinically.  Fatigue improving.  No fevers over interval.  Remains on ciprofloxacin and metronidazole.  On Eliquis.  Tolerating soft diet.  Stable for discharge home.  Complete 10 day course of cipro/flagyl.  Bentyl PRN for stomach cramping.  Eliquis starter pack prescribed.  Discharge Diagnoses:  Principal Problem:   Diverticulitis Active Problems:   BPH (benign prostatic hyperplasia)   Portal vein thrombosis   Sepsis (HCC)   Abnormal liver function  Sepsis (fever, tachycardia, elevated wbc) Diverticulitis Clinical evidence of sepsis improving Leukocytosis improving Cultures no growth to date Continue Cipro/Flagyl for now, plan for 10-day total course GI consulted, appreciate recommendations Advance regular diet as tolerated As needed pain control Patient medically stable for discharge home  Portal vein thrombosis Etiology rather unclear Could be related directly to inflammatory state secondary to diverticulitis Per GI appearance on imaging may indicate some element of chronicity Question whether patient will truly require long-term anticoagulation Hematology consulted per GI, appreciate recommendations Hematology note appreciated Recommending 3 to 6 months of anticoagulation Metastatic work-up ordered per hematology and will be followed outpatient CT thorax performed, negative for metastatic disease Hypercoagulability work-up to be performed as outpatient with hematology Plan: Continue Eliquis treatment dose, 10 mg twice daily x7 days followed by 5 mg twice daily indefinitely Follow-up outpatient with Dr. Grayland Ormond, appointment at cancer center to 1221 at 1015  Atrial fibrillation, new diagnosis Tachycardia and irregular rhythm noted on telemetry Twelve-lead EKG ordered, confirmed atrial fibrillation 2D echocardiogram, EF 50 to [id number], normal diastolic dysfunction, no valvular  disease Continue Eliquis as above Outpatient referral to cardiology  Abnormal liver function Trending down Hepatitis panel negative  ETOH dep Patient has not had a drink in over a year No indication of clinical withdrawal  lobe no longer visible. Geographic fatty change of the liver. More inhomogeneous fatty change of the left lobe. I think findings less likely represent liver infection or infiltrating tumor. There appears to be localized thrombosis of the left division of the portal vein. Pancreas: Normal Spleen: Normal Adrenals/Urinary Tract: Adrenal glands are normal. Kidneys are normal. Bladder is normal. Stomach/Bowel: No sign of bowel obstruction. Advanced diverticulosis of the sigmoid colon. Large diverticulum containing inspissated stool. Low level acute diverticulitis at the descending sigmoid junction. The patient has a giant diverticulum distal to that with a partially calcified stool ball. Vascular/Lymphatic: Aortic atherosclerosis. IVC is normal. No  retroperitoneal adenopathy. Reproductive: Enlarged prostate. Other: No free fluid or air. Musculoskeletal: Lower lumbar chronic degenerative changes. IMPRESSION: Extensive diverticulosis in the sigmoid region. Acute diverticulitis at the descending sigmoid junction. No evidence of abscess or peritonitis. 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 Geographic fatty change of the liver. Heterogeneous appearance of the left lobe probably relates to geographic fatty change in possibly flow disturbance because of the localized portal vein thrombosis. Portal vein thrombosis can occur in the setting of acute inflammatory processes. Electronically Signed   By: Nelson Chimes M.D.   On: 09/18/2019 17:40   ECHOCARDIOGRAM COMPLETE  Result Date: 09/20/2019   ECHOCARDIOGRAM REPORT   Patient Name:   Mason [name] [facility] Date of Exam: 09/20/2019 Medical Rec #:  [id number]         Height:       69.0 in Accession #:    [id number]        Weight:       175.0 lb Date of Birth:  [birth year]        BSA:          1.95 m Patient Age:    71 years          BP:           151/77 mmHg Patient Gender: M                 HR:           108 bpm. Exam Location:  [facility] Procedure: 2D Echo, Color Doppler and Cardiac Doppler Indications:     I48.91 Atrial Fibrillation  History:         Patient has no prior history of Echocardiogram examinations. No                  medical history.  Sonographer:     Charmayne Sheer RDCS (AE) Referring Phys:  [id number] Sidney Ace Diagnosing Phys: Neoma Laming MD  Sonographer Comments: Suboptimal parasternal window. IMPRESSIONS  1. Left ventricular ejection fraction, by visual estimation, is 50 to 55%. The left ventricle has mildly decreased function. There is moderately increased left ventricular hypertrophy.  2. Left ventricular diastolic parameters are indeterminate.  3. The left ventricle demonstrates global hypokinesis.  4. Global right  ventricle has normal systolic function.The right ventricular size is normal. No increase in right ventricular wall thickness.  5. Left atrial size was normal.  6. Right atrial size was normal.  7. The mitral valve is normal in structure. Trivial mitral valve regurgitation. No evidence of mitral stenosis.  8. The tricuspid valve is normal in structure.  9. The tricuspid valve is normal in structure. Tricuspid valve regurgitation is trivial. 10. The aortic valve is normal in structure. Aortic valve regurgitation is not visualized. Mild aortic valve sclerosis without  lobe no longer visible. Geographic fatty change of the liver. More inhomogeneous fatty change of the left lobe. I think findings less likely represent liver infection or infiltrating tumor. There appears to be localized thrombosis of the left division of the portal vein. Pancreas: Normal Spleen: Normal Adrenals/Urinary Tract: Adrenal glands are normal. Kidneys are normal. Bladder is normal. Stomach/Bowel: No sign of bowel obstruction. Advanced diverticulosis of the sigmoid colon. Large diverticulum containing inspissated stool. Low level acute diverticulitis at the descending sigmoid junction. The patient has a giant diverticulum distal to that with a partially calcified stool ball. Vascular/Lymphatic: Aortic atherosclerosis. IVC is normal. No  retroperitoneal adenopathy. Reproductive: Enlarged prostate. Other: No free fluid or air. Musculoskeletal: Lower lumbar chronic degenerative changes. IMPRESSION: Extensive diverticulosis in the sigmoid region. Acute diverticulitis at the descending sigmoid junction. No evidence of abscess or peritonitis. 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 Geographic fatty change of the liver. Heterogeneous appearance of the left lobe probably relates to geographic fatty change in possibly flow disturbance because of the localized portal vein thrombosis. Portal vein thrombosis can occur in the setting of acute inflammatory processes. Electronically Signed   By: Nelson Chimes M.D.   On: 09/18/2019 17:40   ECHOCARDIOGRAM COMPLETE  Result Date: 09/20/2019   ECHOCARDIOGRAM REPORT   Patient Name:   Mason [name] [facility] Date of Exam: 09/20/2019 Medical Rec #:  [id number]         Height:       69.0 in Accession #:    [id number]        Weight:       175.0 lb Date of Birth:  [birth year]        BSA:          1.95 m Patient Age:    71 years          BP:           151/77 mmHg Patient Gender: M                 HR:           108 bpm. Exam Location:  [facility] Procedure: 2D Echo, Color Doppler and Cardiac Doppler Indications:     I48.91 Atrial Fibrillation  History:         Patient has no prior history of Echocardiogram examinations. No                  medical history.  Sonographer:     Charmayne Sheer RDCS (AE) Referring Phys:  [id number] Sidney Ace Diagnosing Phys: Neoma Laming MD  Sonographer Comments: Suboptimal parasternal window. IMPRESSIONS  1. Left ventricular ejection fraction, by visual estimation, is 50 to 55%. The left ventricle has mildly decreased function. There is moderately increased left ventricular hypertrophy.  2. Left ventricular diastolic parameters are indeterminate.  3. The left ventricle demonstrates global hypokinesis.  4. Global right  ventricle has normal systolic function.The right ventricular size is normal. No increase in right ventricular wall thickness.  5. Left atrial size was normal.  6. Right atrial size was normal.  7. The mitral valve is normal in structure. Trivial mitral valve regurgitation. No evidence of mitral stenosis.  8. The tricuspid valve is normal in structure.  9. The tricuspid valve is normal in structure. Tricuspid valve regurgitation is trivial. 10. The aortic valve is normal in structure. Aortic valve regurgitation is not visualized. Mild aortic valve sclerosis without

## 2019-09-22 NOTE — Plan of Care (Signed)
The patient has been discharged. IV removed. Discharge education provided. Teach back method utilized.  Problem: Education: Goal: Knowledge of General Education information will improve Description: Including pain rating scale, medication(s)/side effects and non-pharmacologic comfort measures 09/22/2019 1812 by Myles Rosenthal, Cory Roughen, RN Outcome: Completed/Met 09/22/2019 1812 by Myles Rosenthal, Cory Roughen, RN Outcome: Progressing   Problem: Health Behavior/Discharge Planning: Goal: Ability to manage health-related needs will improve 09/22/2019 1812 by Myles Rosenthal, Cory Roughen, RN Outcome: Completed/Met 09/22/2019 1812 by Myles Rosenthal, Cory Roughen, RN Outcome: Progressing   Problem: Clinical Measurements: Goal: Ability to maintain clinical measurements within normal limits will improve 09/22/2019 1812 by Myles Rosenthal, Cory Roughen, RN Outcome: Completed/Met 09/22/2019 1812 by Myles Rosenthal, Cory Roughen, RN Outcome: Progressing Goal: Will remain free from infection 09/22/2019 1812 by Myles Rosenthal, Cory Roughen, RN Outcome: Completed/Met 09/22/2019 1812 by Myles Rosenthal, Cory Roughen, RN Outcome: Progressing Goal: Diagnostic test results will improve 09/22/2019 1812 by Myles Rosenthal, Cory Roughen, RN Outcome: Completed/Met 09/22/2019 1812 by Myles Rosenthal, Cory Roughen, RN Outcome: Progressing Goal: Respiratory complications will improve 09/22/2019 1812 by Myles Rosenthal, Cory Roughen, RN Outcome: Completed/Met 09/22/2019 1812 by Myles Rosenthal, Cory Roughen, RN Outcome: Progressing Goal: Cardiovascular complication will be avoided 09/22/2019 1812 by Myles Rosenthal, Cory Roughen, RN Outcome: Completed/Met 09/22/2019 1812 by Myles Rosenthal, Cory Roughen, RN Outcome: Progressing   Problem: Coping: Goal: Level of anxiety will decrease 09/22/2019 1812 by Myles Rosenthal, Cory Roughen, RN Outcome: Completed/Met 09/22/2019 1812 by Myles Rosenthal, Cory Roughen, RN Outcome: Progressing   Problem: Elimination: Goal: Will not experience complications  related to bowel motility 09/22/2019 1812 by Ronna Polio, RN Outcome: Completed/Met 09/22/2019 1812 by Myles Rosenthal, Cory Roughen, RN Outcome: Progressing Goal: Will not experience complications related to urinary retention 09/22/2019 1812 by Ronna Polio, RN Outcome: Completed/Met 09/22/2019 1812 by Myles Rosenthal, Cory Roughen, RN Outcome: Progressing   Problem: Pain Managment: Goal: General experience of comfort will improve 09/22/2019 1812 by Ronna Polio, RN Outcome: Completed/Met 09/22/2019 1812 by Myles Rosenthal, Cory Roughen, RN Outcome: Progressing   Problem: Safety: Goal: Ability to remain free from injury will improve 09/22/2019 1812 by Myles Rosenthal, Cory Roughen, RN Outcome: Completed/Met 09/22/2019 1812 by Myles Rosenthal, Cory Roughen, RN Outcome: Progressing   Problem: Acute Rehab PT Goals(only PT should resolve) Goal: Pt Will Perform Standing Balance Or Pre-Gait Outcome: Completed/Met Goal: Pt Will Ambulate Outcome: Completed/Met   Problem: Skin Integrity: Goal: Risk for impaired skin integrity will decrease 09/22/2019 1812 by Ronna Polio, RN Outcome: Completed/Met 09/22/2019 1812 by Ronna Polio, RN Outcome: Progressing

## 2019-09-23 ENCOUNTER — Telehealth: Payer: Self-pay

## 2019-09-23 LAB — CULTURE, BLOOD (ROUTINE X 2)
Culture: NO GROWTH
Culture: NO GROWTH
Special Requests: ADEQUATE
Special Requests: ADEQUATE

## 2019-09-23 NOTE — Telephone Encounter (Signed)
No HFU scheduled at this time. 

## 2019-09-23 NOTE — Telephone Encounter (Signed)
TOC RN CM: Received message from MD requesting patient be set up with home health after discharge. RN CM contacted patient and discussed MD concerns for home health to continue physical therapy. Patient states he is doing great at home and is not interested in any home health. Patient states he has some "stretchy bands" that he is using at home and has a follow up appointment with his PCP scheduled.

## 2019-09-23 NOTE — Telephone Encounter (Signed)
Agreed.  Thanks.  

## 2019-09-23 NOTE — Telephone Encounter (Signed)
Patient was in the hospital.  Has appt on 09/26/2019.

## 2019-09-23 NOTE — Telephone Encounter (Signed)
Transition Care Management Follow-up Telephone Call  Date of discharge and from where: [facility] on 09/22/19  How have you been since you were released from the hospital? Still feels weak and tired. Last night temperature was 99.8 prior to bed. Pt woke up sweating on and off throughout the night. This AM temperature was 98.6. Pt has had an ache around the ascending colon after passing stool but declines any further pain. BMs do not contain blood but color is dark brown/yellow and is not bulky. Declines pain, SOB or n/v/d.  Any questions or concerns? No   Items Reviewed:  Did the pt receive and understand the discharge instructions provided? Yes   Medications obtained and verified? No, declined reviewing over the phone. Pt states he will review these at [place] apt.   Any new allergies since your discharge? No   Dietary orders reviewed? Yes  Do you have support at home? No, lives alone but has a friend to call if needed.   Other (ie: DME, Home Health, etc): N/A  Functional Questionnaire: (I = Independent and D = Dependent)   Bathing/Dressing- I   Meal Prep- I  Eating- I  Maintaining continence- I  Transferring/Ambulation- I  Managing Meds- I   Follow up appointments reviewed:    PCP Hospital f/u appt confirmed? Yes , scheduled to see Dr. Damita Dunnings on 09/26/19 @ 9:30 AM. Approval sent to make sure this apt is ok as scheduled.   [facility] f/u appt confirmed? Yes    Are transportation arrangements needed? No   If their condition worsens, is the pt aware to call  their PCP or go to the ED? Yes  Was the patient provided with contact information for the PCP's office or ED? Yes  Was the pt encouraged to call back with questions or concerns? Yes

## 2019-09-25 ENCOUNTER — Ambulatory Visit: Payer: Medicare Other | Attending: Internal Medicine

## 2019-09-25 DIAGNOSIS — Z20822 Contact with and (suspected) exposure to covid-19: Secondary | ICD-10-CM

## 2019-09-26 ENCOUNTER — Ambulatory Visit (INDEPENDENT_AMBULATORY_CARE_PROVIDER_SITE_OTHER): Payer: Medicare Other | Admitting: Family Medicine

## 2019-09-26 ENCOUNTER — Other Ambulatory Visit: Payer: Self-pay

## 2019-09-26 DIAGNOSIS — I4891 Unspecified atrial fibrillation: Secondary | ICD-10-CM | POA: Diagnosis not present

## 2019-09-26 DIAGNOSIS — R911 Solitary pulmonary nodule: Secondary | ICD-10-CM

## 2019-09-26 DIAGNOSIS — K5792 Diverticulitis of intestine, part unspecified, without perforation or abscess without bleeding: Secondary | ICD-10-CM | POA: Diagnosis not present

## 2019-09-26 DIAGNOSIS — I81 Portal vein thrombosis: Secondary | ICD-10-CM

## 2019-09-26 LAB — NOVEL CORONAVIRUS, NAA: SARS-CoV-2, NAA: NOT DETECTED

## 2019-09-26 NOTE — Progress Notes (Signed)
Virtual visit completed through WebEx or similar program Patient location: home  Provider location: Financial controller at [facility], office   Pandemic considerations d/w pt.   Limitations and rationale for visit method d/w patient.  Patient agreed to proceed.   CC: follow up.    HPI:  =============================== Admit date: 09/18/2019 Discharge date: 09/22/2019  Admitted From: Home Disposition:  Home with home health  Recommendations for Outpatient Follow-up:  1. Follow up with PCP in 1-2 weeks 2. Follow up with Dr. Grayland Ormond from oncology 10/04/19 at 10:15AM  Home Health:Yes, home PT Equipment/Devices:None  Discharge Condition:Stable CODE STATUS:Full Diet recommendation: Heart Healthy  Brief/Interim Summary: [name] a73 y.o.male,w anxiety/ depression , gerd, diverticulosis, apparently presents with upper abdominal pain x 2 days. Pt has had subjective fever for the past 10days, covid negative outpatient testing, and tx with doxycycline x 2 days. Denies n/v, diarrhea, brbpr, black stool, cough, cp, palp, sob. Pt presented due to abdominal pain.   1/28: Patient soundly sleeping on my arrival. Appears comfortable. Pain appears well controlled. Started on Cipro/Flagyl for acute diverticulitis. Etiology of portal vein thrombosis unclear. Gastroenterology consulted requested further evaluation by hematology/oncology.  1/29: Patient overall stable. Not endorsing pain but endorses severe fatigue. Continues on ofloxacin/Flagyl for acute diverticulitis. Seen by hematology yesterday. Etiology of portal vein thrombosis is somewhat unclear. Hematology recommending 3 to 6 months of anticoagulation. Initial CT thorax as part of metastatic work-up was negative for metastatic disease.  Patient did have acute onset of intermittent tachycardia. Appears as A. fib on telemetry. Twelve-lead EKG ordered and is pending.  1/30: Patient seen and examined. Atrial  fibrillation controlled on twelve-lead EKG. Patient improving clinically. Continues to endorse fatigue otherwise asymptomatic. Echocardiogram reassuring. No fevers noted over interval. Remains on ciprofloxacin and metronidazole. Remains on heparin infusion  1/31:Seen and examined. Improving clinically. Fatigue improving. No fevers over interval. Remains on ciprofloxacin and metronidazole. On Eliquis. Tolerating soft diet.  Stable for discharge home.  Complete 10 day course of cipro/flagyl.  Bentyl PRN for stomach cramping.  Eliquis starter pack prescribed.  Discharge Diagnoses:  Principal Problem:   Diverticulitis Active Problems:   BPH (benign prostatic hyperplasia)   Portal vein thrombosis   Sepsis (HCC)   Abnormal liver function   Sepsis (fever, tachycardia, elevated wbc) Diverticulitis Clinical evidence of sepsis improving Leukocytosis improving Cultures no growth to date Continue Cipro/Flagyl for now, plan for 10-day total course GI consulted, appreciate recommendations Advance regular diet as tolerated As needed pain control Patient medically stable for discharge home  Portal vein thrombosis Etiology rather unclear Could be related directly to inflammatory state secondary to diverticulitis Per GI appearance on imaging may indicate some element of chronicity Question whether patient will truly require long-term anticoagulation Hematology consulted per GI, appreciate recommendations Hematology note appreciated Recommending 3 to 6 months of anticoagulation Metastatic work-up ordered per hematology and will be followed outpatient CT thorax performed, negative for metastatic disease Hypercoagulability work-up to be performed as outpatient with hematology Plan: Continue Eliquis treatment dose, 10 mg twice daily x7 days followed by 5 mg twice daily indefinitely Follow-up outpatient with Dr. Grayland Ormond, appointment at cancer center to 1221 at 1015  Atrial  fibrillation, new diagnosis Tachycardia and irregular rhythm noted on telemetry Twelve-lead EKG ordered, confirmed atrial fibrillation 2D echocardiogram, EF 50 to [id number], normal diastolic dysfunction, no valvular disease Continue Eliquis as above Outpatient referral to cardiology  Abnormal liver function Trending down Hepatitis panel negative  ETOH dep Patient has not had a drink in over a  year No indication of clinical withdrawal DC CIWA protocol  Gerd Cont PPI  Glaucoma Cont Timolol Cont Lumigan-> Xalatan  Bph Cont Flomax  Discharge Instructions      Discharge Instructions    Ambulatory referral to Cardiology   Complete by: As directed    Atrial fibrillation, new diagnosis   Call MD for:  persistant nausea and vomiting   Complete by: As directed    Call MD for:  severe uncontrolled pain   Complete by: As directed    Call MD for:  temperature >100.4   Complete by: As directed    Diet - low sodium heart healthy   Complete by: As directed    Start with soft foods.  Avoid rich, fried, or fatty foods.  Eat small meals, gradually increase if your pain is resolved   Discharge instructions   Complete by: As directed    Make sure you are staying well hydrated.  The oncologists office has set up a followup appointment for you.  Please keep this appointment.  I have sent a referral to cardiology, they will contact you for followup appointment details   Increase activity slowly   Complete by: As directed       =============================== Admitted with fever/sweats, found to have diverticulitis along with incidental pulmonary nodule and portal vein thrombosis.  Inpatient imaging discussed with patient.  Incidentally found to be in atrial fibrillation.  Treated with anticoagulation and antibiotics as inpatient and he was able to be discharged home.    1. No evidence of malignancy in the thorax. 2. Small RIGHT lower lobe pulmonary nodule. No  follow-up needed if patient is low-risk. Non-contrast chest CT can be considered in 12 months if patient is high-risk.  3. Persistent LEFT portal vein thrombosis.  AF.  He has cardiology f/u pending.  On anticoagulation.  No CP, not SOB.  He is deconditioned but not focally weak.   No heart racing noted by patient. He has checked his pulse and it wasn't elevated.    Diverticulitis. On abx and has advanced his diet, routine cautions d/w pt.  He is slowly getting his strength back.  No fevers.  No abd pain.  No CP.  He still has sweats at night, that started with this illness.  I would expect that to resolve in the near future, discussed with patient  Portal vein thrombosis d/w pt.  On anticoagulation.  No adverse effect on medication.  He is tolerating anticoagulation.  Meds and allergies reviewed.   ROS: Per HPI unless specifically indicated in ROS section   NAD Speech wnl  A/P:  Pulmonary nodule.  We can get follow-up scan done in 1 year.  Atrial fibrillation.  Tolerating anticoagulation.  No chest pain.  Rationale for anticoagulation discussed with patient.  He has not noted tachycardia at home.  Portal vein thrombosis.  I will ask about GI follow-up and hematology follow-up.  Continue anticoagulation for now.  He has tolerated that in the meantime.  Diverticulitis.  Slowly improving.  He still having sweats but I expect that to improve in the near future.  He has advanced his diet in the meantime and he is tolerating his antibiotics.  Routine cautions given to patient.  F/u PSA already cancelled.  No need to get done now with recent normal value.  He agrees.    I will defer follow-up labs at this point.  I will update GI and hematology.  I appreciate the help of all involved.

## 2019-09-27 NOTE — Progress Notes (Signed)
prostate. Other: No free fluid or air. Musculoskeletal: Lower lumbar chronic degenerative changes. IMPRESSION: Extensive diverticulosis in the sigmoid region. Acute diverticulitis at the descending sigmoid junction. No evidence of abscess or peritonitis. 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 Geographic fatty change of the liver. Heterogeneous appearance of the left lobe probably relates to geographic fatty change in possibly flow disturbance because of the localized portal vein thrombosis. Portal vein thrombosis can occur in the setting of acute inflammatory processes. Electronically Signed   By: Nelson Chimes M.D.   On: 09/18/2019 17:40   ECHOCARDIOGRAM COMPLETE  Result Date: 09/20/2019   ECHOCARDIOGRAM REPORT   Patient Name:   Mason [name] [facility] Date of Exam: 09/20/2019 Medical Rec #:  [id number]         Height:       69.0 in Accession #:    [id number]        Weight:       175.0 lb Date of Birth:  08-Feb-1946        BSA:          1.95 m Patient Age:    74 years          BP:           151/77 mmHg Patient Gender: M                 HR:           108 bpm. Exam Location:  [facility] Procedure: 2D Echo, Color Doppler and Cardiac Doppler Indications:     I48.91 Atrial Fibrillation  History:         Patient has no prior history of Echocardiogram examinations. No                  medical history.   Sonographer:     Charmayne Sheer RDCS (AE) Referring Phys:  [id number] Sidney Ace Diagnosing Phys: Neoma Laming MD  Sonographer Comments: Suboptimal parasternal window. IMPRESSIONS  1. Left ventricular ejection fraction, by visual estimation, is 50 to 55%. The left ventricle has mildly decreased function. There is moderately increased left ventricular hypertrophy.  2. Left ventricular diastolic parameters are indeterminate.  3. The left ventricle demonstrates global hypokinesis.  4. Global right ventricle has normal systolic function.The right ventricular size is normal. No increase in right ventricular wall thickness.  5. Left atrial size was normal.  6. Right atrial size was normal.  7. The mitral valve is normal in structure. Trivial mitral valve regurgitation. No evidence of mitral stenosis.  8. The tricuspid valve is normal in structure.  9. The tricuspid valve is normal in structure. Tricuspid valve regurgitation is trivial. 10. The aortic valve is normal in structure. Aortic valve regurgitation is not visualized. Mild aortic valve sclerosis without stenosis. 11. The pulmonic valve was normal in structure. Pulmonic valve regurgitation is not visualized. 12. The inferior vena cava is normal in size with greater than 50% respiratory variability, suggesting right atrial pressure of 3 mmHg. FINDINGS  Left Ventricle: Left ventricular ejection fraction, by visual estimation, is 50 to 55%. The left ventricle has mildly decreased function. The left ventricle demonstrates global hypokinesis. There is moderately increased left ventricular hypertrophy. Asymmetric left ventricular hypertrophy. Left ventricular diastolic parameters are indeterminate. Normal left atrial pressure. Right Ventricle: The right ventricular size is normal. No increase in right ventricular wall thickness. Global RV systolic function is has normal systolic function. Left Atrium:  [place]  [phone] Fax:([phone]  ID: TAGE SCHOLZE OB: 06/10/1946  MR#: [id number]  [id number]  Patient Care Team: Tonia Ghent, MD as PCP - General (Family Medicine) Ralene Bathe, MD as Consulting Physician (Dermatology) Pinnix-Bailey, Damaris Schooner, MD as Consulting Physician (Dentistry) Celine Ahr. West Bali, OD as Consulting Physician (Optometry) Pieter Partridge, DO as Consulting Physician (Neurology)  CHIEF COMPLAINT: Portal vein thrombosis.  INTERVAL HISTORY: Patient is a 75 year old male who was initially evaluated in the hospital after incidentally being found to have a portal vein thrombosis.  Malignancy work-up to date has been negative.  He continues to improve after his hospitalization, but does not feel back to his baseline.  He has no neurologic complaints.  He denies any recent fevers.  He has a fair appetite, but denies weight loss.  He has no chest pain, shortness of breath, cough, or hemoptysis.  He denies any nausea, vomiting, constipation, or diarrhea.  He has no urinary complaints.  Patient offers no further specific complaints today.  REVIEW OF SYSTEMS:   Review of Systems  Constitutional: Positive for malaise/fatigue. Negative for fever and weight loss.  Respiratory: Negative.  Negative for cough, hemoptysis and shortness of breath.   Cardiovascular: Negative.  Negative for chest pain and leg swelling.  Gastrointestinal: Negative.  Negative for abdominal pain, blood in stool, diarrhea, melena and vomiting.  Genitourinary: Negative.  Negative for dysuria.  Musculoskeletal: Negative.  Negative for back pain.  Skin: Negative.  Negative for rash.  Neurological: Positive for weakness. Negative for dizziness, focal weakness and headaches.  Psychiatric/Behavioral: Negative.  The patient is not nervous/anxious.     As per HPI. Otherwise, a complete review of systems is negative.  PAST MEDICAL HISTORY: Past Medical  History:  Diagnosis Date  . Allergy   . Anal fissure   . Anxiety   . Cancer ([place])    basal cell removed x4  . Cataract    bil eyes  . Depression   . Diverticulosis of colon   . GERD (gastroesophageal reflux disease)    esophageal narrowing  . Glaucoma   . Hiatal hernia   . IBS (irritable bowel syndrome)   . Insomnia   . Internal hemorrhoids     PAST SURGICAL HISTORY: Past Surgical History:  Procedure Laterality Date  . ANAL SPHINCTEROTOMY  03/1995   fistulotomy  . APPENDECTOMY    . BASAL CELL CARCINOMA EXCISION     on back  . BICEPT TENODESIS Right 09/27/2004   decompression  . COLONOSCOPY  04/1999   internal hemmroids,12-11-08 colon tics and hems only   . ESOPHAGOGASTRODUODENOSCOPY  04/1999   with esophagitis and stricture  . MOHS SURGERY  09/02/2008   L supratip of nose with flap,basal cell cancer [facility]  . TONSILLECTOMY AND ADENOIDECTOMY     remote  . UPPER GASTROINTESTINAL ENDOSCOPY    . WRIST SURGERY Right    13 years ago  . WRIST SURGERY     scar tissue removed    FAMILY HISTORY: Family History  Problem Relation Age of Onset  . Hypertension Mother   . Stroke Mother   . Pneumonia Father   . Bladder Cancer Father   . Cancer Maternal Aunt        ? type  . Breast cancer Maternal Aunt        mets  . Esophageal cancer Maternal Aunt        aunt ? esophageal ca  . Stomach cancer Maternal  prostate. Other: No free fluid or air. Musculoskeletal: Lower lumbar chronic degenerative changes. IMPRESSION: Extensive diverticulosis in the sigmoid region. Acute diverticulitis at the descending sigmoid junction. No evidence of abscess or peritonitis. 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 Geographic fatty change of the liver. Heterogeneous appearance of the left lobe probably relates to geographic fatty change in possibly flow disturbance because of the localized portal vein thrombosis. Portal vein thrombosis can occur in the setting of acute inflammatory processes. Electronically Signed   By: Nelson Chimes M.D.   On: 09/18/2019 17:40   ECHOCARDIOGRAM COMPLETE  Result Date: 09/20/2019   ECHOCARDIOGRAM REPORT   Patient Name:   Mason [name] [facility] Date of Exam: 09/20/2019 Medical Rec #:  [id number]         Height:       69.0 in Accession #:    [id number]        Weight:       175.0 lb Date of Birth:  08-Feb-1946        BSA:          1.95 m Patient Age:    74 years          BP:           151/77 mmHg Patient Gender: M                 HR:           108 bpm. Exam Location:  [facility] Procedure: 2D Echo, Color Doppler and Cardiac Doppler Indications:     I48.91 Atrial Fibrillation  History:         Patient has no prior history of Echocardiogram examinations. No                  medical history.   Sonographer:     Charmayne Sheer RDCS (AE) Referring Phys:  [id number] Sidney Ace Diagnosing Phys: Neoma Laming MD  Sonographer Comments: Suboptimal parasternal window. IMPRESSIONS  1. Left ventricular ejection fraction, by visual estimation, is 50 to 55%. The left ventricle has mildly decreased function. There is moderately increased left ventricular hypertrophy.  2. Left ventricular diastolic parameters are indeterminate.  3. The left ventricle demonstrates global hypokinesis.  4. Global right ventricle has normal systolic function.The right ventricular size is normal. No increase in right ventricular wall thickness.  5. Left atrial size was normal.  6. Right atrial size was normal.  7. The mitral valve is normal in structure. Trivial mitral valve regurgitation. No evidence of mitral stenosis.  8. The tricuspid valve is normal in structure.  9. The tricuspid valve is normal in structure. Tricuspid valve regurgitation is trivial. 10. The aortic valve is normal in structure. Aortic valve regurgitation is not visualized. Mild aortic valve sclerosis without stenosis. 11. The pulmonic valve was normal in structure. Pulmonic valve regurgitation is not visualized. 12. The inferior vena cava is normal in size with greater than 50% respiratory variability, suggesting right atrial pressure of 3 mmHg. FINDINGS  Left Ventricle: Left ventricular ejection fraction, by visual estimation, is 50 to 55%. The left ventricle has mildly decreased function. The left ventricle demonstrates global hypokinesis. There is moderately increased left ventricular hypertrophy. Asymmetric left ventricular hypertrophy. Left ventricular diastolic parameters are indeterminate. Normal left atrial pressure. Right Ventricle: The right ventricular size is normal. No increase in right ventricular wall thickness. Global RV systolic function is has normal systolic function. Left Atrium:  prostate. Other: No free fluid or air. Musculoskeletal: Lower lumbar chronic degenerative changes. IMPRESSION: Extensive diverticulosis in the sigmoid region. Acute diverticulitis at the descending sigmoid junction. No evidence of abscess or peritonitis. 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 Geographic fatty change of the liver. Heterogeneous appearance of the left lobe probably relates to geographic fatty change in possibly flow disturbance because of the localized portal vein thrombosis. Portal vein thrombosis can occur in the setting of acute inflammatory processes. Electronically Signed   By: Nelson Chimes M.D.   On: 09/18/2019 17:40   ECHOCARDIOGRAM COMPLETE  Result Date: 09/20/2019   ECHOCARDIOGRAM REPORT   Patient Name:   Mason [name] [facility] Date of Exam: 09/20/2019 Medical Rec #:  [id number]         Height:       69.0 in Accession #:    [id number]        Weight:       175.0 lb Date of Birth:  08-Feb-1946        BSA:          1.95 m Patient Age:    74 years          BP:           151/77 mmHg Patient Gender: M                 HR:           108 bpm. Exam Location:  [facility] Procedure: 2D Echo, Color Doppler and Cardiac Doppler Indications:     I48.91 Atrial Fibrillation  History:         Patient has no prior history of Echocardiogram examinations. No                  medical history.   Sonographer:     Charmayne Sheer RDCS (AE) Referring Phys:  [id number] Sidney Ace Diagnosing Phys: Neoma Laming MD  Sonographer Comments: Suboptimal parasternal window. IMPRESSIONS  1. Left ventricular ejection fraction, by visual estimation, is 50 to 55%. The left ventricle has mildly decreased function. There is moderately increased left ventricular hypertrophy.  2. Left ventricular diastolic parameters are indeterminate.  3. The left ventricle demonstrates global hypokinesis.  4. Global right ventricle has normal systolic function.The right ventricular size is normal. No increase in right ventricular wall thickness.  5. Left atrial size was normal.  6. Right atrial size was normal.  7. The mitral valve is normal in structure. Trivial mitral valve regurgitation. No evidence of mitral stenosis.  8. The tricuspid valve is normal in structure.  9. The tricuspid valve is normal in structure. Tricuspid valve regurgitation is trivial. 10. The aortic valve is normal in structure. Aortic valve regurgitation is not visualized. Mild aortic valve sclerosis without stenosis. 11. The pulmonic valve was normal in structure. Pulmonic valve regurgitation is not visualized. 12. The inferior vena cava is normal in size with greater than 50% respiratory variability, suggesting right atrial pressure of 3 mmHg. FINDINGS  Left Ventricle: Left ventricular ejection fraction, by visual estimation, is 50 to 55%. The left ventricle has mildly decreased function. The left ventricle demonstrates global hypokinesis. There is moderately increased left ventricular hypertrophy. Asymmetric left ventricular hypertrophy. Left ventricular diastolic parameters are indeterminate. Normal left atrial pressure. Right Ventricle: The right ventricular size is normal. No increase in right ventricular wall thickness. Global RV systolic function is has normal systolic function. Left Atrium:  [place]  Telephone:(336) [phone] Fax:(336) [phone]  ID: TAGE SCHOLZE OB: 06/10/1946  MR#: [id number]  [id number]  Patient Care Team: Tonia Ghent, MD as PCP - General (Family Medicine) Ralene Bathe, MD as Consulting Physician (Dermatology) Pinnix-Bailey, Damaris Schooner, MD as Consulting Physician (Dentistry) Celine Ahr. West Bali, OD as Consulting Physician (Optometry) Pieter Partridge, DO as Consulting Physician (Neurology)  CHIEF COMPLAINT: Portal vein thrombosis.  INTERVAL HISTORY: Patient is a 75 year old male who was initially evaluated in the hospital after incidentally being found to have a portal vein thrombosis.  Malignancy work-up to date has been negative.  He continues to improve after his hospitalization, but does not feel back to his baseline.  He has no neurologic complaints.  He denies any recent fevers.  He has a fair appetite, but denies weight loss.  He has no chest pain, shortness of breath, cough, or hemoptysis.  He denies any nausea, vomiting, constipation, or diarrhea.  He has no urinary complaints.  Patient offers no further specific complaints today.  REVIEW OF SYSTEMS:   Review of Systems  Constitutional: Positive for malaise/fatigue. Negative for fever and weight loss.  Respiratory: Negative.  Negative for cough, hemoptysis and shortness of breath.   Cardiovascular: Negative.  Negative for chest pain and leg swelling.  Gastrointestinal: Negative.  Negative for abdominal pain, blood in stool, diarrhea, melena and vomiting.  Genitourinary: Negative.  Negative for dysuria.  Musculoskeletal: Negative.  Negative for back pain.  Skin: Negative.  Negative for rash.  Neurological: Positive for weakness. Negative for dizziness, focal weakness and headaches.  Psychiatric/Behavioral: Negative.  The patient is not nervous/anxious.     As per HPI. Otherwise, a complete review of systems is negative.  PAST MEDICAL HISTORY: Past Medical  History:  Diagnosis Date  . Allergy   . Anal fissure   . Anxiety   . Cancer ([place])    basal cell removed x4  . Cataract    bil eyes  . Depression   . Diverticulosis of colon   . GERD (gastroesophageal reflux disease)    esophageal narrowing  . Glaucoma   . Hiatal hernia   . IBS (irritable bowel syndrome)   . Insomnia   . Internal hemorrhoids     PAST SURGICAL HISTORY: Past Surgical History:  Procedure Laterality Date  . ANAL SPHINCTEROTOMY  03/1995   fistulotomy  . APPENDECTOMY    . BASAL CELL CARCINOMA EXCISION     on back  . BICEPT TENODESIS Right 09/27/2004   decompression  . COLONOSCOPY  04/1999   internal hemmroids,12-11-08 colon tics and hems only   . ESOPHAGOGASTRODUODENOSCOPY  04/1999   with esophagitis and stricture  . MOHS SURGERY  09/02/2008   L supratip of nose with flap,basal cell cancer [facility]  . TONSILLECTOMY AND ADENOIDECTOMY     remote  . UPPER GASTROINTESTINAL ENDOSCOPY    . WRIST SURGERY Right    13 years ago  . WRIST SURGERY     scar tissue removed    FAMILY HISTORY: Family History  Problem Relation Age of Onset  . Hypertension Mother   . Stroke Mother   . Pneumonia Father   . Bladder Cancer Father   . Cancer Maternal Aunt        ? type  . Breast cancer Maternal Aunt        mets  . Esophageal cancer Maternal Aunt        aunt ? esophageal ca  . Stomach cancer Maternal  [place]  Telephone:([phone]) [phone] [phone]  ID: TAGE SCHOLZE OB: 06/10/1946  MR#: [id number]  [id number]  Patient Care Team: Tonia Ghent, MD as PCP - General (Family Medicine) Ralene Bathe, MD as Consulting Physician (Dermatology) Pinnix-Bailey, Damaris Schooner, MD as Consulting Physician (Dentistry) Celine Ahr. [place] Bali, OD as Consulting Physician (Optometry) Pieter Partridge, DO as Consulting Physician (Neurology)  CHIEF COMPLAINT: Portal vein thrombosis.  INTERVAL HISTORY: Patient is a 75 year old male who was initially evaluated in the hospital after incidentally being found to have a portal vein thrombosis.  Malignancy work-up to date has been negative.  He continues to improve after his hospitalization, but does not feel back to his baseline.  He has no neurologic complaints.  He denies any recent fevers.  He has a fair appetite, but denies weight loss.  He has no chest pain, shortness of breath, cough, or hemoptysis.  He denies any nausea, vomiting, constipation, or diarrhea.  He has no urinary complaints.  Patient offers no further specific complaints today.  REVIEW OF SYSTEMS:   Review of Systems  Constitutional: Positive for malaise/fatigue. Negative for fever and weight loss.  Respiratory: Negative.  Negative for cough, hemoptysis and shortness of breath.   Cardiovascular: Negative.  Negative for chest pain and leg swelling.  Gastrointestinal: Negative.  Negative for abdominal pain, blood in stool, diarrhea, melena and vomiting.  Genitourinary: Negative.  Negative for dysuria.  Musculoskeletal: Negative.  Negative for back pain.  Skin: Negative.  Negative for rash.  Neurological: Positive for weakness. Negative for dizziness, focal weakness and headaches.  Psychiatric/Behavioral: Negative.  The patient is not nervous/anxious.     As per HPI. Otherwise, a complete review of systems is negative.  PAST MEDICAL HISTORY: Past Medical  History:  Diagnosis Date  . Allergy   . Anal fissure   . Anxiety   . Cancer ([place])    basal cell removed x4  . Cataract    bil eyes  . Depression   . Diverticulosis of colon   . GERD (gastroesophageal reflux disease)    esophageal narrowing  . Glaucoma   . Hiatal hernia   . IBS (irritable bowel syndrome)   . Insomnia   . Internal hemorrhoids     PAST SURGICAL HISTORY: Past Surgical History:  Procedure Laterality Date  . ANAL SPHINCTEROTOMY  03/1995   fistulotomy  . APPENDECTOMY    . BASAL CELL CARCINOMA EXCISION     on back  . BICEPT TENODESIS Right 09/27/2004   decompression  . COLONOSCOPY  04/1999   internal hemmroids,12-11-08 colon tics and hems only   . ESOPHAGOGASTRODUODENOSCOPY  04/1999   with esophagitis and stricture  . MOHS SURGERY  09/02/2008   L supratip of nose with flap,basal cell cancer [facility]  . TONSILLECTOMY AND ADENOIDECTOMY     remote  . UPPER GASTROINTESTINAL ENDOSCOPY    . WRIST SURGERY Right    13 years ago  . WRIST SURGERY     scar tissue removed    FAMILY HISTORY: Family History  Problem Relation Age of Onset  . Hypertension Mother   . Stroke Mother   . Pneumonia Father   . Bladder Cancer Father   . Cancer Maternal Aunt        ? type  . Breast cancer Maternal Aunt        mets  . Esophageal cancer Maternal Aunt        aunt ? esophageal ca  . Stomach cancer Maternal

## 2019-09-29 ENCOUNTER — Telehealth: Payer: Self-pay | Admitting: Family Medicine

## 2019-09-29 DIAGNOSIS — I4891 Unspecified atrial fibrillation: Secondary | ICD-10-CM | POA: Insufficient documentation

## 2019-09-29 DIAGNOSIS — R911 Solitary pulmonary nodule: Secondary | ICD-10-CM | POA: Insufficient documentation

## 2019-09-29 DIAGNOSIS — K5792 Diverticulitis of intestine, part unspecified, without perforation or abscess without bleeding: Secondary | ICD-10-CM

## 2019-09-29 NOTE — Assessment & Plan Note (Signed)
Portal vein thrombosis.  I will ask about GI follow-up and hematology follow-up.  Continue anticoagulation for now.  He has tolerated that in the meantime.

## 2019-09-29 NOTE — Assessment & Plan Note (Signed)
Tolerating anticoagulation.  No chest pain.  Rationale for anticoagulation discussed with patient.  He has not noted tachycardia at home.

## 2019-09-29 NOTE — Assessment & Plan Note (Signed)
Pulmonary nodule.  We can get follow-up scan done in 1 year.

## 2019-09-29 NOTE — Telephone Encounter (Signed)
Routed to GI, cardiology, hematology as FYI/for input.  Talk with patient recently on virtual visit.  He sounded as though he was slowly improving.  I did not have him come in for follow-up labs yet.  If he is coming to your clinic for an in person visit then I thought labs could be collected at that point.  I did not change in medications.  I appreciate the help of all involved.

## 2019-09-29 NOTE — Assessment & Plan Note (Signed)
Slowly improving.  He still having sweats but I expect that to improve in the near future.  He has advanced his diet in the meantime and he is tolerating his antibiotics.  Routine cautions given to patient.  He will update me as needed.

## 2019-09-30 ENCOUNTER — Ambulatory Visit (INDEPENDENT_AMBULATORY_CARE_PROVIDER_SITE_OTHER): Payer: Medicare Other | Admitting: Cardiology

## 2019-09-30 ENCOUNTER — Encounter: Payer: Self-pay | Admitting: Cardiology

## 2019-09-30 ENCOUNTER — Ambulatory Visit (INDEPENDENT_AMBULATORY_CARE_PROVIDER_SITE_OTHER): Payer: Medicare Other

## 2019-09-30 ENCOUNTER — Other Ambulatory Visit: Payer: Self-pay

## 2019-09-30 VITALS — BP 160/99 | HR 68 | Ht 69.5 in | Wt 171.5 lb

## 2019-09-30 DIAGNOSIS — R030 Elevated blood-pressure reading, without diagnosis of hypertension: Secondary | ICD-10-CM

## 2019-09-30 DIAGNOSIS — I81 Portal vein thrombosis: Secondary | ICD-10-CM | POA: Diagnosis not present

## 2019-09-30 DIAGNOSIS — I480 Paroxysmal atrial fibrillation: Secondary | ICD-10-CM

## 2019-09-30 MED ORDER — APIXABAN 5 MG PO TABS: 5.0000 mg | ORAL_TABLET | Freq: Two times a day (BID) | ORAL | 5 refills | Status: DC

## 2019-09-30 MED ORDER — METOPROLOL SUCCINATE ER 25 MG PO TB24: 25.0000 mg | ORAL_TABLET | Freq: Every day | ORAL | 5 refills | Status: DC

## 2019-09-30 NOTE — Telephone Encounter (Signed)
Done

## 2019-09-30 NOTE — Patient Instructions (Signed)
Medication Instructions:  Your physician has recommended you make the following change in your medication:   1) START Metoprolol Succinate 25 mg daily. An Rx has been sent to your pharmacy.  Eliquis has been refilled today.  *If you need a refill on your cardiac medications before your next appointment, please call your pharmacy*  Lab Work: None ordered If you have labs (blood work) drawn today and your tests are completely normal, you will receive your results only by: Marland Kitchen MyChart Message (if you have MyChart) OR . A paper copy in the mail If you have any lab test that is abnormal or we need to change your treatment, we will call you to review the results.  Testing/Procedures: Your physician has recommended that you wear an zio-monitor. Zio monitors are medical devices that record the heart's electrical activity. Doctors most often [place] these monitors to diagnose arrhythmias. Arrhythmias are problems with the speed or rhythm of the heartbeat. The monitor is a small, portable device. You can wear one while you do your normal daily activities. This is usually used to diagnose what is causing palpitations/syncope (passing out).    Follow-Up: At [facility], you and your health needs are our priority.  As part of our continuing mission to provide you with exceptional heart care, we have created designated Provider Care Teams.  These Care Teams include your primary Cardiologist (physician) and Advanced Practice Providers (APPs -  Physician Assistants and Nurse Practitioners) who all work together to provide you with the care you need, when you need it.  Your next appointment:   6 month(s)  The format for your next appointment:   In Person  Provider:    You may see  Dr. Mylo Red- Charlestine Night or one of the following Advanced Practice Providers on your designated Care Team:    Murray Hodgkins, NP  Christell Faith, PA-C  Marrianne Mood, PA-C   Other Instructions Your physician has recommended  that you wear a Zio monitor. This monitor is a medical device that records the heart's electrical activity. Doctors most often use these monitors to diagnose arrhythmias. Arrhythmias are problems with the speed or rhythm of the heartbeat. The monitor is a small device applied to your chest. You can wear one while you do your normal daily activities. While wearing this monitor if you have any symptoms to push the button and record what you felt. Once you have worn this monitor for the period of time provider prescribed (Usually 14 days), you will return the monitor device in the postage paid box. Once it is returned they will download the data collected and provide [place] with a report which the provider will then review and we will call you with those results. Important tips:  1. Avoid showering during the first 24 hours of wearing the monitor. 2. Avoid excessive sweating to help maximize wear time. 3. Do not submerge the device, no hot tubs, and no swimming pools. 4. Keep any lotions or oils away from the patch. 5. After 24 hours you may shower with the patch on. Take brief showers with your back facing the shower head.  6. Do not remove patch once it has been placed because that will interrupt data and decrease adhesive wear time. 7. Push the button when you have any symptoms and write down what you were feeling. 8. Once you have completed wearing your monitor, remove and place into box which has postage paid and place in your outgoing mailbox.  9. If for some  reason you have misplaced your box then call our office and we can provide another box and/or mail it off for you.

## 2019-09-30 NOTE — Addendum Note (Signed)
Addended by: Tonia Ghent on: 09/30/2019 06:44 AM   Modules accepted: Orders

## 2019-09-30 NOTE — Telephone Encounter (Signed)
I see him on 2/12. He is not scheduled to get labs that day, but let me know what you need.

## 2019-09-30 NOTE — Progress Notes (Signed)
Cardiology Office Note:    Date:  09/30/2019   ID:  Cobin, Hirtz [birth year], MRN [id number]  PCP:  Joaquim Nam, MD  Cardiologist:  No primary care provider on file.  Electrophysiologist:  None   Referring MD: Tresa Moore, MD   Chief Complaint  Patient presents with  . other    F/u ED afib no complaints today. Meds reviewed verbally with pt.    History of Present Illness:    Mason [name] is a 74 y.o. male with a hx of GERD, anxiety, portal venous thrombosis, who presents due to atrial fibrillation.  Patient with history of abdominal discomfort about 3 weeks ago and presented to the emergency room.  Abdominal imaging with CT abdomen pelvis with contrast revealed acute diverticulitis and portal vein thrombosis.  He was started on antibiotics and Eliquis.  He was seen by GI and hematology.  Anticoagulation for 3 to 6 months was recommended.  Patient later on noticed to have intermittent tachycardia on telemetry.  EKG on 09/20/2019 showed atrial fibrillation with heart rate 109.  Subsequent ECGs showed sinus rhythm. Echocardiogram obtained on 08/2019 showed low normal ejection fraction of 50 to 55%, left and right atrial sizes were normal.  Past Medical History:  Diagnosis Date  . Allergy   . Anal fissure   . Anxiety   . Cancer (HCC)    basal cell removed x4  . Cataract    bil eyes  . Depression   . Diverticulosis of colon   . GERD (gastroesophageal reflux disease)    esophageal narrowing  . Glaucoma   . Hiatal hernia   . IBS (irritable bowel syndrome)   . Insomnia   . Internal hemorrhoids     Past Surgical History:  Procedure Laterality Date  . ANAL SPHINCTEROTOMY  03/1995   fistulotomy  . APPENDECTOMY    . BASAL CELL CARCINOMA EXCISION     on back  . BICEPT TENODESIS Right 09/27/2004   decompression  . COLONOSCOPY  04/1999   internal hemmroids,12-11-08 colon tics and hems only   . ESOPHAGOGASTRODUODENOSCOPY  04/1999   with esophagitis and  stricture  . MOHS SURGERY  09/02/2008   L supratip of nose with flap,basal cell cancer [facility]/MOHS  . TONSILLECTOMY AND ADENOIDECTOMY     remote  . UPPER GASTROINTESTINAL ENDOSCOPY    . WRIST SURGERY Right    13 years ago  . WRIST SURGERY     scar tissue removed    Current Medications: Current Meds  Medication Sig  . apixaban (ELIQUIS) 5 MG TABS tablet Take 1 tablet (5 mg total) by mouth 2 (two) times daily.  Marland Kitchen b complex vitamins capsule Take 1 capsule by mouth daily.  . Bimatoprost (LUMIGAN) 0.01 % SOLN Apply 1 drop to eye daily.    . cholecalciferol (VITAMIN D) 1000 UNITS tablet Take 1,000 Units by mouth daily.  Marland Kitchen esomeprazole (NEXIUM) 40 MG capsule TAKE 1 CAPSULE BY MOUTH TWICE EVERY DAY  . fluticasone (FLONASE) 50 MCG/ACT nasal spray Place 2 sprays into both nostrils daily.  Marland Kitchen levocetirizine (XYZAL) 5 MG tablet Take 1 tablet (5 mg total) by mouth every evening.  . tamsulosin (FLOMAX) 0.4 MG CAPS capsule TAKE 1-2 CAPSULES (0.4- 0.8 MG TOTAL) BY MOUTH DAILY.  Marland Kitchen timolol (TIMOPTIC-XR) 0.5 % ophthalmic gel-forming Place 1 drop into the left eye every morning.  . vitamin C (ASCORBIC ACID) 500 MG tablet Take 500 mg by mouth daily.  . [DISCONTINUED] apixaban (ELIQUIS) 5 MG  TABS tablet Take 2 tablets (10mg ) twice daily for 7 days, then 1 tablet (5mg ) twice daily (Patient taking differently: Take 5 mg by mouth 2 (two) times daily. )     Allergies:   Chlordiazepoxide-clidinium, Citalopram hydrobromide, Erythromycin base, Hyoscyamine sulfate, Meloxicam, Penicillins, and Shrimp [shellfish allergy]   Social History   Socioeconomic History  . Marital status: Divorced    Spouse name: Not on file  . Number of children: 1  . Years of education: Not on file  . Highest education level: Not on file  Occupational History  . Occupation: Art therapist, retired 1997    Employer: [employer]  Tobacco Use  . Smoking status: Former Smoker    Types: Cigarettes, Cigars    Quit date:  08/22/1998    Years since quitting: 21.1  . Smokeless tobacco: Never Used  . Tobacco comment: cigarettes 1968, cigars 2000  Substance and Sexual Activity  . Alcohol use: Yes    Alcohol/week: 0.0 standard drinks    Comment: rare  . Drug use: No  . Sexual activity: Never  Other Topics Concern  . Not on file  Social History Narrative   Retired from [employer]   From [place]   Prev divorced    1 son   Gar[place] '63-'67, no known agent orange exposure   Social Determinants of Corporate investment banker Strain:   . Difficulty of Paying Living Expenses: Not on file  Food Insecurity:   . Worried About Programme researcher, broadcasting/film/video in the Last Year: Not on file  . Ran Out of Food in the Last Year: Not on file  Transportation Needs:   . Lack of Transportation (Medical): Not on file  . Lack of Transportation (Non-Medical): Not on file  Physical Activity:   . Days of Exercise per Week: Not on file  . Minutes of Exercise per Session: Not on file  Stress:   . Feeling of Stress : Not on file  Social Connections:   . Frequency of Communication with Friends and Family: Not on file  . Frequency of Social Gatherings with Friends and Family: Not on file  . Attends Religious Services: Not on file  . Active Member of Clubs or Organizations: Not on file  . Attends Banker Meetings: Not on file  . Marital Status: Not on file     Family History: The patient's family history includes Bladder Cancer in his father; Breast cancer in his maternal aunt; Cancer in his maternal aunt and maternal grandmother; Colon cancer in his maternal aunt; Esophageal cancer in his maternal aunt; Hypertension in his mother; Pneumonia in his father; Stomach cancer in his maternal aunt; Stroke in his mother. There is no history of Prostate cancer or Rectal cancer.  ROS:   Please see the history of present illness.     All other systems reviewed and are negative.  EKGs/Labs/Other Studies Reviewed:    The following studies were  reviewed today:   EKG:  EKG is  ordered today.  The ekg ordered today demonstrates normal sinus rhythm, left axis deviation.  Recent Labs: 09/19/2019: ALT 42 09/21/2019: BUN 8; Creatinine, Ser 0.90; Hemoglobin 11.6; Magnesium 1.9; Platelets 475; Potassium 3.8; Sodium 138  Recent Lipid Panel    Component Value Date/Time   CHOL 155 06/28/2019 0757   TRIG 98.0 06/28/2019 0757   HDL 39.20 06/28/2019 0757   CHOLHDL 4 06/28/2019 0757   VLDL 19.6 06/28/2019 0757   LDLCALC 96 06/28/2019 0757    Physical  Exam:    VS:  BP (!) 160/99 (BP Location: Right Arm, Patient Position: Sitting, Cuff Size: Normal)   Pulse 68   Ht 5' 9.5" (1.765 m)   Wt 171 lb 8 oz (77.8 kg)   SpO2 97%   BMI 24.96 kg/m     Wt Readings from Last 3 Encounters:  09/30/19 171 lb 8 oz (77.8 kg)  09/22/19 179 lb 14.4 oz (81.6 kg)  07/30/19 175 lb (79.4 kg)     GEN:  Well nourished, well developed in no acute distress HEENT: Normal NECK: No JVD; No carotid bruits LYMPHATICS: No lymphadenopathy CARDIAC: RRR, no murmurs, rubs, gallops RESPIRATORY:  Clear to auscultation without rales, wheezing or rhonchi  ABDOMEN: Soft, non-tender, non-distended MUSCULOSKELETAL:  No edema; No deformity  SKIN: Warm and dry NEUROLOGIC:  Alert and oriented x 3 PSYCHIATRIC:  Normal affect   ASSESSMENT:    1. Paroxysmal atrial fibrillation (HCC)   2. Portal vein thrombosis   3. Elevated BP without diagnosis of hypertension    PLAN:    In order of problems listed above:  1. Patient with history of paroxysmal atrial fibrillation.  Currently in sinus rhythm.  CHA2DS2-VASc score equals 2.  Transthoracic echocardiogram showed low normal ejection fraction with EF 50 to 55%.  Agree with Eliquis 5 mg twice daily.  Start Toprol-XL 25 mg daily.  Place cardiac monitor x2 weeks to evaluate A. fib burden. 2. History of portal vein thrombosis.  On Eliquis.  Being seen by hematology.  Continue Eliquis. 3. Blood pressures are elevated today.   His BP is usually normal in the 130s.  Possibly due to anxiety.  Continue to monitor for now.  If elevated at follow-up visit, we will plan to start an antihypertensive.  Follow-up in 6 months.  This note was generated in part or whole with voice recognition software. Voice recognition is usually quite accurate but there are transcription errors that can and very often do occur. I apologize for any typographical errors that were not detected and corrected.  Medication Adjustments/Labs and Tests Ordered: Current medicines are reviewed at length with the patient today.  Concerns regarding medicines are outlined above.  Orders Placed This Encounter  Procedures  . LONG TERM MONITOR (3-14 DAYS)  . EKG 12-Lead   Meds ordered this encounter  Medications  . apixaban (ELIQUIS) 5 MG TABS tablet    Sig: Take 1 tablet (5 mg total) by mouth 2 (two) times daily.    Dispense:  60 tablet    Refill:  5  . metoprolol succinate (TOPROL XL) 25 MG 24 hr tablet    Sig: Take 1 tablet (25 mg total) by mouth daily.    Dispense:  30 tablet    Refill:  5    There are no Patient Instructions on file for this visit.   Signed, Debbe Odea, MD  09/30/2019 9:44 AM    [facility]

## 2019-09-30 NOTE — Telephone Encounter (Signed)
Cmet and cbc.  I put in the orders.  Thanks.

## 2019-10-01 ENCOUNTER — Encounter: Payer: Self-pay | Admitting: Family Medicine

## 2019-10-03 ENCOUNTER — Other Ambulatory Visit: Payer: Self-pay | Admitting: Emergency Medicine

## 2019-10-03 ENCOUNTER — Encounter: Payer: Self-pay | Admitting: Family Medicine

## 2019-10-03 DIAGNOSIS — R911 Solitary pulmonary nodule: Secondary | ICD-10-CM

## 2019-10-04 ENCOUNTER — Other Ambulatory Visit: Payer: Self-pay | Admitting: Oncology

## 2019-10-04 ENCOUNTER — Inpatient Hospital Stay: Payer: Medicare Other

## 2019-10-04 ENCOUNTER — Inpatient Hospital Stay: Payer: Medicare Other | Attending: Oncology | Admitting: Oncology

## 2019-10-04 ENCOUNTER — Other Ambulatory Visit: Payer: Self-pay

## 2019-10-04 VITALS — BP 163/99 | HR 73 | Temp 97.3°F | Wt 168.1 lb

## 2019-10-04 DIAGNOSIS — K5730 Diverticulosis of large intestine without perforation or abscess without bleeding: Secondary | ICD-10-CM | POA: Diagnosis not present

## 2019-10-04 DIAGNOSIS — I4891 Unspecified atrial fibrillation: Secondary | ICD-10-CM | POA: Insufficient documentation

## 2019-10-04 DIAGNOSIS — I81 Portal vein thrombosis: Secondary | ICD-10-CM | POA: Diagnosis not present

## 2019-10-04 DIAGNOSIS — R911 Solitary pulmonary nodule: Secondary | ICD-10-CM | POA: Diagnosis not present

## 2019-10-04 DIAGNOSIS — Z85828 Personal history of other malignant neoplasm of skin: Secondary | ICD-10-CM | POA: Insufficient documentation

## 2019-10-04 DIAGNOSIS — K5732 Diverticulitis of large intestine without perforation or abscess without bleeding: Secondary | ICD-10-CM | POA: Diagnosis not present

## 2019-10-04 DIAGNOSIS — I700 Atherosclerosis of aorta: Secondary | ICD-10-CM | POA: Insufficient documentation

## 2019-10-04 DIAGNOSIS — K760 Fatty (change of) liver, not elsewhere classified: Secondary | ICD-10-CM | POA: Insufficient documentation

## 2019-10-04 DIAGNOSIS — K648 Other hemorrhoids: Secondary | ICD-10-CM | POA: Diagnosis not present

## 2019-10-04 DIAGNOSIS — N400 Enlarged prostate without lower urinary tract symptoms: Secondary | ICD-10-CM | POA: Diagnosis not present

## 2019-10-04 LAB — CBC WITH DIFFERENTIAL/PLATELET
Abs Immature Granulocytes: 0.06 10*3/uL (ref 0.00–0.07)
Basophils Absolute: 0.1 10*3/uL (ref 0.0–0.1)
Basophils Relative: 1 %
Eosinophils Absolute: 0.4 10*3/uL (ref 0.0–0.5)
Eosinophils Relative: 4 %
HCT: 45.4 % (ref 39.0–52.0)
Hemoglobin: 14.4 g/dL (ref 13.0–17.0)
Immature Granulocytes: 1 %
Lymphocytes Relative: 21 %
Lymphs Abs: 2 10*3/uL (ref 0.7–4.0)
MCH: 29.2 pg (ref 26.0–34.0)
MCHC: 31.7 g/dL (ref 30.0–36.0)
MCV: 92.1 fL (ref 80.0–100.0)
Monocytes Absolute: 0.8 10*3/uL (ref 0.1–1.0)
Monocytes Relative: 8 %
Neutro Abs: 6.2 10*3/uL (ref 1.7–7.7)
Neutrophils Relative %: 65 %
Platelets: 511 10*3/uL — ABNORMAL HIGH (ref 150–400)
RBC: 4.93 MIL/uL (ref 4.22–5.81)
RDW: 15.1 % (ref 11.5–15.5)
WBC: 9.5 10*3/uL (ref 4.0–10.5)
nRBC: 0 % (ref 0.0–0.2)

## 2019-10-04 LAB — COMPREHENSIVE METABOLIC PANEL
ALT: 37 U/L (ref 0–44)
AST: 36 U/L (ref 15–41)
Albumin: 3.5 g/dL (ref 3.5–5.0)
Alkaline Phosphatase: 71 U/L (ref 38–126)
Anion gap: 7 (ref 5–15)
BUN: 10 mg/dL (ref 8–23)
CO2: 30 mmol/L (ref 22–32)
Calcium: 8.6 mg/dL — ABNORMAL LOW (ref 8.9–10.3)
Chloride: 96 mmol/L — ABNORMAL LOW (ref 98–111)
Creatinine, Ser: 1 mg/dL (ref 0.61–1.24)
GFR calc Af Amer: >60 mL/min (ref >60)
GFR calc non Af Amer: >60 mL/min (ref >60)
Glucose, Bld: 120 mg/dL — ABNORMAL HIGH (ref 70–99)
Potassium: 4.2 mmol/L (ref 3.5–5.1)
Sodium: 133 mmol/L — ABNORMAL LOW (ref 135–145)
Total Bilirubin: 0.6 mg/dL (ref 0.3–1.2)
Total Protein: 7.3 g/dL (ref 6.5–8.1)

## 2019-10-04 LAB — ANTITHROMBIN III: AntiThromb III Func: 90 % (ref 75–120)

## 2019-10-04 NOTE — Progress Notes (Signed)
Pt here to first visit at [facility] after hospital admission. Questions and concerns gone over. Pt has no specific questions or concerns at this time.

## 2019-10-05 LAB — PROTEIN C ACTIVITY: Protein C Activity: 85 % (ref 73–180)

## 2019-10-05 LAB — PROTEIN S, TOTAL: Protein S Ag, Total: 76 % (ref 60–150)

## 2019-10-05 LAB — PROTEIN S ACTIVITY: Protein S Activity: 66 % (ref 63–140)

## 2019-10-06 LAB — LUPUS ANTICOAGULANT PANEL
DRVVT: 34.3 s (ref 0.0–47.0)
PTT Lupus Anticoagulant: 44.1 s (ref 0.0–51.9)

## 2019-10-06 LAB — BETA-2-GLYCOPROTEIN I ABS, IGG/M/A
Beta-2 Glyco I IgG: <9 GPI IgG units (ref 0–20)
Beta-2-Glycoprotein I IgA: <9 GPI IgA units (ref 0–25)
Beta-2-Glycoprotein I IgM: <9 GPI IgM units (ref 0–32)

## 2019-10-06 LAB — PROTEIN C, TOTAL: Protein C, Total: 49 % — ABNORMAL LOW (ref 60–150)

## 2019-10-06 NOTE — Progress Notes (Signed)
10/06/2019 KALIJAH MCCARVILLE [id number] Jun 27, 1946   CHIEF COMPLAINT: Portal vein thrombosis   HISTORY OF PRESENT ILLNESS:  Mr. Kaleal Bamba is a 74 year old male with a past medical history of anxiety, depression, glaucoma, GERD and diverticulosis. S/P anal sphincterotomy/fistulotomy 1996. He was admitted to the hospital on 09/18/2019 with upper abdominal pain and a fever for 7 to 8 days. COVID 19 testing was negative. WBC 9.5. Hg 13.8. A CTAP showed acute descending sigmoid diverticulitis and a portal vein thrombosis. He was treated with Cipro and Flagyl IV. He was seen by hematology, etiology of his portal thrombosis was unclear. No obvious malignancy was identified. Chest CT was negative. He developed atrial fibrillation during his hospitalization and he was placed on a Heparin infusion. He was transitioned to Eliquis and discharged home on 09/22/2019. He was prescribed Cipro 500mg  po bid and Flagyl 500mg  po tid to complete a 10 day course. He was instructed to schedule a hematology and cardiac consult. He was evaluated by hematologist/oncologist, Dr. Gerarda Fraction on 10/04/2019. No evidence of protein S deficiency.  Elevated platelet count 400 - 500.  A repeat colonoscopy was advised to rule out colon cancer.  Dr. Orlie Dakin advised the patient to continue Eliquis for 6 months. Maternal aunt with history of colon cancer. No history of colon polyps. His most recent colonoscopy was done 06/11/15 by Dr. Marina Goodell which showed left colon diverticulosis and internal hemorrhoids. See colonoscopy reports below. He was seen by cardiologist, Debbe Odea, on  09/30/2019 who recommended continued Eliquis 5mg  bid, start Toprol XL and cardiac monitoring for 2 weeks was initiated, to be completed 10/14/2019. He denies having any further abdominal pain. He is passing a normal formed BM once daily. No rectal bleeding or melena. He took Tylenol alternating with Ibuprofen for 1 week when he had a fever prior to his  hospitalization. No dysphagia, heartburn or stomach pain. He takes Esomeprazole 40mg  QD.  EGD in 2015 was normal. No other complaints today.    CBC Latest Ref Rng & Units 10/04/2019 09/21/2019 09/20/2019  WBC 4.0 - 10.5 K/uL 9.5 12.0(H) 13.1(H)  Hemoglobin 13.0 - 17.0 g/dL 01.0 11.6(L) 11.5(L)  Hematocrit 39.0 - 52.0 % 45.4 34.8(L) 34.5(L)  Platelets 150 - 400 K/uL 511(H) 475(H) 447(H)   CMP Latest Ref Rng & Units 10/04/2019 09/21/2019 09/19/2019  Glucose 70 - 99 mg/dL 932(T) 95 76  BUN 8 - 23 mg/dL 10 8 11   Creatinine 0.61 - 1.24 mg/dL 5.57 3.22 0.25  Sodium 135 - 145 mmol/L 133(L) 138 136  Potassium 3.5 - 5.1 mmol/L 4.2 3.8 4.0  Chloride 98 - 111 mmol/L 96(L) 105 101  CO2 22 - 32 mmol/L 30 26 22   Calcium 8.9 - 10.3 mg/dL 4.2(H) 7.8(L) 8.2(L)  Total Protein 6.5 - 8.1 g/dL 7.3 - 6.1(L)  Total Bilirubin 0.3 - 1.2 mg/dL 0.6 - 0.9  Alkaline Phos 38 - 126 U/L 71 - 78  AST 15 - 41 U/L 36 - 26  ALT 0 - 44 U/L 37 - 42   Chest CT with contrast  09/20/2019: 1. No evidence of malignancy in the thorax. 2. Small RIGHT lower lobe pulmonary nodule. No follow-up needed if patient is low-risk. Non-contrast chest CT can be considered in 12 months if patient is high-risk  Abdominal/Pelvic CT with contrast 09/18/2019: Extensive diverticulosis in the sigmoid region. Acute diverticulitis at the descending sigmoid junction. No evidence of abscess or peritonitis. Giant diverticulum distal to that with a partially calcified stool  ball.  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 Geographic fatty change of the liver. Heterogeneous appearance of the left lobe probably relates to geographic fatty change in possibly flow disturbance because of the localized portal vein thrombosis. Portal vein thrombosis can occur in the setting of acute inflammatory Processes  Echocardiogram: 2021 showed low normal ejection fraction of 50 to 55%, left and right atrial sizes were normal.  Colonoscopy 08/25/2014 by  Dr. Yancey Flemings: 1. Severe diverticulosis was noted in the left colon. 2. The examination was otherwise normal. Excellent prep  Colonoscopy 12/11/2008: 1. Severe diverticulosis was found in the sigmoid colon. No polyps.  2. Internal hemorrhoids.   Colonoscopy 10/13/2003:  1. Diverticulosis descending and sigmoid colon. 2. Internal hemorrhoids.   Colonoscopy 05/21/1999: 1. Diverticulosis. 2. Internal hemorrhoids.   EGD 08/29/2013: Normal.  Past Medical History:  Diagnosis Date  . Allergy   . Anal fissure   . Anxiety   . Cancer (HCC)    basal cell removed x4  . Cataract    bil eyes  . Depression   . Diverticulosis of colon   . GERD (gastroesophageal reflux disease)    esophageal narrowing  . Glaucoma   . Hiatal hernia   . IBS (irritable bowel syndrome)   . Insomnia   . Internal hemorrhoids    Past Surgical History:  Procedure Laterality Date  . ANAL SPHINCTEROTOMY  03/1995   fistulotomy  . APPENDECTOMY    . BASAL CELL CARCINOMA EXCISION     on back  . BICEPT TENODESIS Right 09/27/2004   decompression  . COLONOSCOPY  04/1999   internal hemmroids,12-11-08 colon tics and hems only   . ESOPHAGOGASTRODUODENOSCOPY  04/1999   with esophagitis and stricture  . MOHS SURGERY  09/02/2008   L supratip of nose with flap,basal cell cancer [facility]/MOHS  . TONSILLECTOMY AND ADENOIDECTOMY     remote  . UPPER GASTROINTESTINAL ENDOSCOPY    . WRIST SURGERY Right    13 years ago  . WRIST SURGERY     scar tissue removed    reports that he quit smoking about 21 years ago. His smoking use included cigarettes and cigars. He has never used smokeless tobacco. He reports current alcohol use. He reports that he does not use drugs. family history includes Bladder Cancer in his father; Breast cancer in his maternal aunt; Cancer in his maternal aunt and maternal grandmother; Colon cancer in his maternal aunt; Esophageal cancer in his maternal aunt; Hypertension in his mother; Pneumonia in his  father; Stomach cancer in his maternal aunt; Stroke in his mother. Allergies  Allergen Reactions  . Chlordiazepoxide-Clidinium     REACTION: urinary retention  . Citalopram Hydrobromide     Intolerant, tried 03/2011.  "It made me very angry".  . Erythromycin Base     REACTION: stomach upset  . Hyoscyamine Sulfate     REACTION: urinary retention  . Meloxicam     GI upset  . Penicillins     REACTION: Itching, rash  . Shrimp [Shellfish Allergy]     And crab- rash, GI upset, upper airway symptoms      Outpatient Encounter Medications as of 10/07/2019  Medication Sig  . apixaban (ELIQUIS) 5 MG TABS tablet Take 1 tablet (5 mg total) by mouth 2 (two) times daily.  Marland Kitchen b complex vitamins capsule Take 1 capsule by mouth daily.  . Bimatoprost (LUMIGAN) 0.01 % SOLN Apply 1 drop to eye daily.    . cholecalciferol (VITAMIN D) 1000  UNITS tablet Take 1,000 Units by mouth daily.  Marland Kitchen esomeprazole (NEXIUM) 40 MG capsule TAKE 1 CAPSULE BY MOUTH TWICE EVERY DAY  . fluticasone (FLONASE) 50 MCG/ACT nasal spray Place 2 sprays into both nostrils daily.  Marland Kitchen levocetirizine (XYZAL) 5 MG tablet Take 1 tablet (5 mg total) by mouth every evening.  . metoprolol succinate (TOPROL XL) 25 MG 24 hr tablet Take 1 tablet (25 mg total) by mouth daily.  . tamsulosin (FLOMAX) 0.4 MG CAPS capsule TAKE 1-2 CAPSULES (0.4- 0.8 MG TOTAL) BY MOUTH DAILY.  Marland Kitchen timolol (TIMOPTIC-XR) 0.5 % ophthalmic gel-forming Place 1 drop into the left eye every morning.  . vitamin C (ASCORBIC ACID) 500 MG tablet Take 500 mg by mouth daily.  . [DISCONTINUED] traZODone (DESYREL) 50 MG tablet 0.5-1 tab at night for insomnia.   No facility-administered encounter medications on file as of 10/07/2019.     REVIEW OF SYSTEMS: Gen: Denies fever, sweats or chills. No weight loss.  CV: Denies chest pain, palpitations or edema. Resp: Denies cough, shortness of breath of hemoptysis.  GI: See HPI. GU : Denies urinary burning, blood in urine, increased  urinary frequency or incontinence. MS: Denies joint pain, muscles aches or weakness. Derm: Denies rash, itchiness, skin lesions or unhealing ulcers. Psych: Denies depression, anxiety, memory loss, suicidal ideation and confusion. Heme: Denies bruising, bleeding. Neuro:  Denies headaches, dizziness or paresthesias. Endo:  Denies any problems with DM, thyroid or adrenal function.   PHYSICAL EXAM: BP 136/86   Pulse 60   Temp 98.5 F (36.9 C)   Ht 5' 9.5" (1.765 m)   Wt 168 lb (76.2 kg)   BMI 24.45 kg/m  General: Well developed 74 year old male  in no acute distress. Head: Normocephalic and atraumatic. Eyes:  Sclerae non-icteric, conjunctive pink. Ears: Normal auditory acuity. Mouth: Tongue with brown coating. No ulcers or lesions.  Neck: Supple, no lymphadenopathy or thyromegaly.  Lungs: Clear bilaterally to auscultation without wheezes, crackles or rhonchi. Heart: Regular rate and rhythm. No murmur, rub or gallop appreciated.  Abdomen: Soft, nontender, non distended. No masses. No hepatosplenomegaly. Normoactive bowel sounds x 4 quadrants.  Rectal:  Musculoskeletal: Symmetrical with no gross deformities. Skin: Warm and dry. No rash or lesions on visible extremities. Extremities: No edema. Neurological: Alert oriented x 4, no focal deficits.  Psychological:  Alert and cooperative. Normal mood and affect.  ASSESSMENT AND PLAN:  69. 74 year old male with a portal vein thrombosis of unclear etiology, (? Due to acute inflammation secondary to diverticulitis vs colon malignancy) on Eliquis. Thromobocytosis with negative hypercoagulable work up by hematologist, Dr. Tonye Becket.  Repeat colonoscopy to rule out colorectal cancer.  -He will most likely require Lovenox bridge prior to colonoscopy -Cardiac clearance prior to proceeding with a colonoscopy in setting of new onset atrial fibrillation. Two week cardiac monitor to end on 10/14/2019.  -Hematology clearance to verify Eliquis  instructions prior to colonoscopy in setting of recent portal vein thrombosis.  -Colonoscopy benefits and risks discussed including risk with sedation, risk of bleeding, perforation and infection   2. Paroxysmal Atrial fibrillation on Toprol XL and Eliquis. CHA2DS2-VASc score equals 2.    3. Diverticulitis  -see plan # 1  4. Family history of colon cancer  -see plan # 1    CC:  Finnegan, Tollie Pizza, MD

## 2019-10-07 ENCOUNTER — Encounter: Payer: Self-pay | Admitting: Nurse Practitioner

## 2019-10-07 ENCOUNTER — Ambulatory Visit (INDEPENDENT_AMBULATORY_CARE_PROVIDER_SITE_OTHER): Payer: Medicare Other | Admitting: Nurse Practitioner

## 2019-10-07 ENCOUNTER — Other Ambulatory Visit: Payer: Self-pay

## 2019-10-07 ENCOUNTER — Telehealth: Payer: Self-pay | Admitting: General Surgery

## 2019-10-07 VITALS — BP 136/86 | HR 60 | Temp 98.5°F | Ht 69.5 in | Wt 168.0 lb

## 2019-10-07 DIAGNOSIS — I4891 Unspecified atrial fibrillation: Secondary | ICD-10-CM

## 2019-10-07 DIAGNOSIS — K5792 Diverticulitis of intestine, part unspecified, without perforation or abscess without bleeding: Secondary | ICD-10-CM

## 2019-10-07 DIAGNOSIS — I81 Portal vein thrombosis: Secondary | ICD-10-CM | POA: Diagnosis not present

## 2019-10-07 NOTE — Patient Instructions (Addendum)
If you are age 76 or older, your body mass index should be between 23-30. Your Body mass index is 24.45 kg/m. If this is out of the aforementioned range listed, please consider follow up with your Primary Care Provider.  If you are age 23 or younger, your body mass index should be between 19-25. Your Body mass index is 24.45 kg/m. If this is out of the aformentioned range listed, please consider follow up with your Primary Care Provider.   We will contact Cardiology and hematology for clearance to include Eliquis instructions and a possible Lovenox bridge prior to a Colonoscopy.  Due to recent changes in healthcare laws, you may see the results of your imaging and laboratory studies on MyChart before your provider has had a chance to review them.  We understand that in some cases there may be results that are confusing or concerning to you. Not all laboratory results come back in the same time frame and the provider may be waiting for multiple results in order to interpret others.  Please give [place] 48 hours in order for your provider to thoroughly review all the results before contacting the office for clarification of your results.

## 2019-10-07 NOTE — Telephone Encounter (Signed)
   Primary Cardiologist: Dr Synthia Innocent MD  Chart reviewed as part of pre-operative protocol coverage. Given past medical history and time since last visit, based on ACC/AHA guidelines, Mason [name] would be at acceptable risk for the planned procedure without further cardiovascular testing.   After review with our pharmacist it's felt this patient is a relatively high risk and we recommend holding Eliquis only one day pre op with no Lovenox crossover.  If a 3 day Eliquis hold is required then the patient would need Lovenox crossover which we can arrange if neccessary.   I will route this recommendation to the requesting party via Epic fax function and remove from pre-op pool.  Please call with questions.  Kerin Ransom, PA-C 10/07/2019, 2:24 PM

## 2019-10-07 NOTE — Telephone Encounter (Signed)
[facility] Pre-operative Risk Assessment     Request for surgical clearance:     Endoscopy Procedure  What type of surgery is being performed?     Colonoscopy  When is this surgery scheduled?     Will schedule upon your clearance  What type of clearance is required ?   Pharmacy  Are there any medications that need to be held prior to surgery and how long? Eliquis with possible Lovenox bridge  Practice name and name of physician performing surgery?      [facility]  What is your office phone and fax number?      Phone- [phone]  [phone]  Anesthesia type (None, local, MAC, general) ?       MAC

## 2019-10-07 NOTE — Telephone Encounter (Signed)
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 EKG then showed afib on 09/20/19, CHADS2VASc score of 2 (age x2) in addition to portal vein thrombosis - hypercoag work up in process. Renal function is normal.  Would see if pt can just hold Eliquis for 1 day for lower risk colonoscopy procedure per our protocol. Then would be able to avoid Lovenox bridge. If > 1 day hold is required, can coordinate Lovenox bridge in [place] office where pt is managed.

## 2019-10-08 LAB — PROTHROMBIN GENE MUTATION

## 2019-10-08 LAB — CARDIOLIPIN ANTIBODIES, IGG, IGM, IGA
Anticardiolipin IgA: <9 APL U/mL (ref 0–11)
Anticardiolipin IgG: <9 GPL U/mL (ref 0–14)
Anticardiolipin IgM: 16 MPL U/mL — ABNORMAL HIGH (ref 0–12)

## 2019-10-08 LAB — FACTOR 5 LEIDEN

## 2019-10-08 MED ORDER — NA SULFATE-K SULFATE-MG SULF 17.5-3.13-1.6 GM/177ML PO SOLN: 1.0000 | Freq: Once | ORAL | 0 refills | Status: AC

## 2019-10-08 NOTE — Telephone Encounter (Signed)
Mason [name], pls call the patient and schedule his colonoscopy 6 weeks from his diagnosis of diverticulitis as verified by Dr. Henrene Pastor. Pt to hold Eliquis for 1 day prior to his colonoscopy. thx

## 2019-10-08 NOTE — Telephone Encounter (Signed)
Reviewed.  Multiple prior colonoscopies negative for neoplasia.  Most recent exam 2016 Okay for elective colonoscopy about 6 weeks from the bout of acute diverticulitis.  Plans to hold Eliquis the day prior noted. Thank you

## 2019-10-08 NOTE — Telephone Encounter (Signed)
Please see the attached regarding anticoagulation from the patients cardiologist and hematologist.

## 2019-10-08 NOTE — Telephone Encounter (Signed)
Agree with recommendations.  

## 2019-10-08 NOTE — Telephone Encounter (Signed)
Dr. Henrene Pastor, refer to office consult 10/07/2019. Pls review the below messages: Hematology and Cardiology cleared patient for colonoscopy with the recommendation to hold his Eliquis for only 1 day as patient has a portal vein thrombosis and he had afib during his recent hospitalization. He was also diagnosed with mild diverticulitis to descending sigmoid junction during the same hospitalization. Please verify how soon you are ok with scheduling his colonoscopy. THX.

## 2019-10-08 NOTE — Telephone Encounter (Signed)
The patient contacted the office to schedule his colonoscopy. Scheduled for 11/05/2019 and the covid test was scheduled 11/01/2019. Verified the patients pharmacy to send suprep and expressed he will need to discontinue his Eliquis for 24 hours. Patient will receive all instructions via mychart and will call if not received today,

## 2019-10-08 NOTE — Addendum Note (Signed)
Addended by: Lanny Hurst A on: 10/08/2019 01:55 PM   Modules accepted: Orders

## 2019-10-08 NOTE — Progress Notes (Signed)
Assessment and plan reviewed.  Plans for colonoscopy to be arranged in about 6 weeks after the bout of acute diverticulitis.

## 2019-10-08 NOTE — Telephone Encounter (Signed)
Noted  

## 2019-10-08 NOTE — Telephone Encounter (Signed)
Tried to contact the patient to schedule his colonoscopy, it needs to be scheduled mid March 2021 which will be 6 weeks after his dx of diverticulitis. Left a message on his voicemail to contact the office back to schedule.

## 2019-10-15 ENCOUNTER — Telehealth: Payer: Self-pay | Admitting: Cardiology

## 2019-10-15 NOTE — Telephone Encounter (Signed)
Pt c/o medication issue:  1. Name of Medication: Eliquis    2. How are you currently taking this medication (dosage and times per day)?  5 mg po BID  3. Are you having a reaction (difficulty breathing--STAT)? No   4. What is your medication issue? Patient has low grade fevers at night and c/o body aches and pains and wants to know what meds he can safely take with Eliquis

## 2019-10-15 NOTE — Telephone Encounter (Signed)
Spoke with the patient. Advised the patient the patient that he should avoid NSAIDs and Asa while taking Eliquis as they can increase his risk of bleeding.  Advised the patient that he could take Tylenol prn and not exceed the daily recommended dosage. Advised the patient that if his symptom persist more than 2-3 days he should contact his pcp for further recommendation.  Patient sts that he has had these symptoms for 2-3 weeks his medical doctors (PCP,GI,Hematologist) are aware and he is currently being worked up to determine the cause.  Patient voiced appreciation for the call back.

## 2019-10-16 ENCOUNTER — Telehealth: Payer: Self-pay

## 2019-10-16 NOTE — Telephone Encounter (Signed)
I spoke with pt and not sure why but I could not get into this pt message; please see phone note on 10/16/19. Pt notified as instructed from Dr Josefine Class responses and pt said he was opening my chart now so he could see Dr Josefine Class responses.

## 2019-10-16 NOTE — Telephone Encounter (Signed)
I sent a MyChart message to the patient but please triage him and see if he can come in for an in person office visit this week.  30 minutes would be useful if available.  Thanks.

## 2019-10-16 NOTE — Telephone Encounter (Signed)
Noted. Thanks. Agreed.  

## 2019-10-16 NOTE — Telephone Encounter (Signed)
I spoke with pt and he has been having night sweats; at nighttime pts temp is 99 -100. Last night pt took Tylenol and rested better.  Pt said he feels generalized weakness, body aches, dry cough and on 10/15/19 after coughing episode pt had 30-40 seconds that he did not feel like he was getting any air in his lungs; pt was wheezing then also. Pt has had runny nose and drainage at back of throat Robitussin DM was not helping and pt has been taking Claritin suggested by Dr Damita Dunnings and that seems to be helping to dry up runny nose and drainage at back of throat; pt said he is in no distress and scheduled in office appt per pt message from Dr Damita Dunnings on 10/17/19 at 12:30. UC & ED precautions given and pt voiced understanding.pt had negative covid test on 09/25/19. Pt does not have other covid symptoms and to pts knowledge has not been exposed to + covid. FYI to Dr Damita Dunnings.

## 2019-10-17 ENCOUNTER — Ambulatory Visit (INDEPENDENT_AMBULATORY_CARE_PROVIDER_SITE_OTHER)
Admission: RE | Admit: 2019-10-17 | Discharge: 2019-10-17 | Disposition: A | Discharge status: Home / Self Care | Payer: Medicare Other | Source: Ambulatory Visit | Attending: Family Medicine | Admitting: Family Medicine

## 2019-10-17 ENCOUNTER — Other Ambulatory Visit: Payer: Self-pay

## 2019-10-17 ENCOUNTER — Ambulatory Visit (INDEPENDENT_AMBULATORY_CARE_PROVIDER_SITE_OTHER): Payer: Medicare Other | Admitting: Family Medicine

## 2019-10-17 ENCOUNTER — Encounter: Payer: Self-pay | Admitting: Family Medicine

## 2019-10-17 VITALS — BP 142/82 | HR 68 | Temp 97.5°F | Ht 69.5 in | Wt 171.4 lb

## 2019-10-17 DIAGNOSIS — R05 Cough: Secondary | ICD-10-CM

## 2019-10-17 DIAGNOSIS — R059 Cough, unspecified: Secondary | ICD-10-CM

## 2019-10-17 LAB — COMPREHENSIVE METABOLIC PANEL
ALT: 30 U/L (ref 0–53)
AST: 27 U/L (ref 0–37)
Albumin: 3.6 g/dL (ref 3.5–5.2)
Alkaline Phosphatase: 67 U/L (ref 39–117)
BUN: 9 mg/dL (ref 6–23)
CO2: 34 mEq/L — ABNORMAL HIGH (ref 19–32)
Calcium: 9 mg/dL (ref 8.4–10.5)
Chloride: 99 mEq/L (ref 96–112)
Creatinine, Ser: 0.95 mg/dL (ref 0.40–1.50)
GFR: 77.25 mL/min (ref >60.00)
Glucose, Bld: 115 mg/dL — ABNORMAL HIGH (ref 70–99)
Potassium: 4.3 mEq/L (ref 3.5–5.1)
Sodium: 136 mEq/L (ref 135–145)
Total Bilirubin: 0.4 mg/dL (ref 0.2–1.2)
Total Protein: 6.8 g/dL (ref 6.0–8.3)

## 2019-10-17 LAB — CBC WITH DIFFERENTIAL/PLATELET
Basophils Absolute: 0.1 10*3/uL (ref 0.0–0.1)
Basophils Relative: 1.5 % (ref 0.0–3.0)
Eosinophils Absolute: 0.6 10*3/uL (ref 0.0–0.7)
Eosinophils Relative: 6.9 % — ABNORMAL HIGH (ref 0.0–5.0)
HCT: 42.6 % (ref 39.0–52.0)
Hemoglobin: 14.3 g/dL (ref 13.0–17.0)
Lymphocytes Relative: 19.5 % (ref 12.0–46.0)
Lymphs Abs: 1.8 10*3/uL (ref 0.7–4.0)
MCHC: 33.6 g/dL (ref 30.0–36.0)
MCV: 89.3 fl (ref 78.0–100.0)
Monocytes Absolute: 1 10*3/uL (ref 0.1–1.0)
Monocytes Relative: 10.8 % (ref 3.0–12.0)
Neutro Abs: 5.5 10*3/uL (ref 1.4–7.7)
Neutrophils Relative %: 61.3 % (ref 43.0–77.0)
Platelets: 330 10*3/uL (ref 150.0–400.0)
RBC: 4.77 Mil/uL (ref 4.22–5.81)
RDW: 15.6 % — ABNORMAL HIGH (ref 11.5–15.5)
WBC: 9 10*3/uL (ref 4.0–10.5)

## 2019-10-17 NOTE — Patient Instructions (Signed)
Go to the lab on the way out.   If you have mychart we'll likely use that to update you.    Let me see about options after I see your labs and CXR.  Take care.  Glad to see you.

## 2019-10-17 NOTE — Progress Notes (Signed)
This visit occurred during the SARS-CoV-2 public health emergency.  Safety protocols were in place, including screening questions prior to the visit, additional usage of staff PPE, and extensive cleaning of exam room while observing appropriate contact time as indicated for disinfecting solutions.  Persistent episodic cough that is worse with talking, cough drops help.  He has a tickle in his throat.  Sinus pressure/congestion but no rhinorrhea.  He hasn't has as much sweating at night the last two nights.  Using tylenol at night for diffuse aches.  He isn't SOB.  Recent covid neg test.    He had lower protein C level.  D/w pt.  He has hematology f/u pending.  He is anticoagulated.   Meds, vitals, and allergies reviewed.   ROS: Per HPI unless specifically indicated in ROS section   GEN: nad, alert and oriented HEENT: ncat, TM wnl NECK: supple w/o LA CV: rrr PULM: ctab, no inc wob ABD: soft, +bs EXT: no edema SKIN: no acute rash Sinuses not ttp.   TM wnl Brownish changes on the tongue, noted in the last few weeks.  No ulceration.

## 2019-10-18 ENCOUNTER — Other Ambulatory Visit: Payer: Self-pay | Admitting: Family Medicine

## 2019-10-18 MED ORDER — DOXYCYCLINE HYCLATE 100 MG PO TABS: 100.0000 mg | ORAL_TABLET | Freq: Two times a day (BID) | ORAL | 0 refills | Status: DC

## 2019-10-20 DIAGNOSIS — R05 Cough: Secondary | ICD-10-CM | POA: Insufficient documentation

## 2019-10-20 DIAGNOSIS — R059 Cough, unspecified: Secondary | ICD-10-CM | POA: Insufficient documentation

## 2019-10-20 NOTE — Assessment & Plan Note (Signed)
See notes on labs.  Still okay for outpatient follow-up.  He does not have pneumonia visible on his chest x-ray but he does have some bronchitic changes. Given his cough, I would start taking doxycycline with the plan for him to update me if he is getting worse in the meantime.

## 2019-10-23 ENCOUNTER — Telehealth: Payer: Self-pay | Admitting: Family Medicine

## 2019-10-23 DIAGNOSIS — K148 Other diseases of tongue: Secondary | ICD-10-CM

## 2019-10-23 NOTE — Telephone Encounter (Signed)
This patient still have brownish changes on his tongue?  Please let me know.  If he does then I want him to see ENT and I can put in referral if needed.  Thanks.

## 2019-10-24 NOTE — Telephone Encounter (Signed)
Left message on patient's voicemail to return call

## 2019-10-25 NOTE — Telephone Encounter (Signed)
Patient says the brownish changes are still there and he would like an ENT referral.

## 2019-10-27 NOTE — Telephone Encounter (Signed)
Referral placed  Thanks!

## 2019-10-27 NOTE — Progress Notes (Signed)
[facility]  [phone] [phone]  ID: RODERT KENNELL OB: [birth year]  MR#: [id number]  OZD#:[id number]  Patient Care Team: Joaquim Nam, MD as PCP - General (Family Medicine) Deirdre Evener, MD as Consulting Physician (Dermatology) Pinnix-Bailey, Dinah Beers, MD as Consulting Physician (Dentistry) Judith Blonder. Starla Link, OD as Consulting Physician (Optometry) Drema Dallas, DO as Consulting Physician (Neurology)  I connected with Ginette Otto on 10/29/19 at  2:15 PM EST by video enabled telemedicine visit and verified that I am speaking with the correct person using two identifiers.   I discussed the limitations, risks, security and privacy concerns of performing an evaluation and management service by telemedicine and the availability of in-person appointments. I also discussed with the patient that there may be a patient responsible charge related to this service. The patient expressed understanding and agreed to proceed.   Other persons participating in the visit and their role in the encounter: Patient, MD.  Patient's location: Home. Provider's location: Clinic.  CHIEF COMPLAINT: Portal vein thrombosis.  INTERVAL HISTORY: Patient agreed to video enabled telemedicine visit for further evaluation and discussion of his laboratory results.  He currently feels well and is nearly back to his baseline.  He does not complain of any weakness fatigue. He has no neurologic complaints.  He denies any recent fevers.  He has a fair appetite, but denies weight loss.  He has no chest pain, shortness of breath, cough, or hemoptysis.  He denies any abdominal pain.  He denies any nausea, vomiting, constipation, or diarrhea.  He has no urinary complaints.  Patient offers no specific complaints today.  REVIEW OF SYSTEMS:   Review of Systems  Constitutional: Negative.  Negative for fever, malaise/fatigue and weight loss.  Respiratory: Negative.  Negative  for cough, hemoptysis and shortness of breath.   Cardiovascular: Negative.  Negative for chest pain and leg swelling.  Gastrointestinal: Negative.  Negative for abdominal pain, blood in stool, diarrhea, melena and vomiting.  Genitourinary: Negative.  Negative for dysuria.  Musculoskeletal: Negative.  Negative for back pain.  Skin: Negative.  Negative for rash.  Neurological: Negative.  Negative for dizziness, focal weakness, weakness and headaches.  Psychiatric/Behavioral: Negative.  The patient is not nervous/anxious.     As per HPI. Otherwise, a complete review of systems is negative.  PAST MEDICAL HISTORY: Past Medical History:  Diagnosis Date  . Allergy   . Anal fissure   . Anxiety   . Cancer (HCC)    basal cell removed x4  . Cataract    bil eyes  . Depression   . Diverticulosis of colon   . GERD (gastroesophageal reflux disease)    esophageal narrowing  . Glaucoma   . Hiatal hernia   . IBS (irritable bowel syndrome)   . Insomnia   . Internal hemorrhoids     PAST SURGICAL HISTORY: Past Surgical History:  Procedure Laterality Date  . ANAL SPHINCTEROTOMY  03/1995   fistulotomy  . APPENDECTOMY    . BASAL CELL CARCINOMA EXCISION     on back  . BICEPT TENODESIS Right 09/27/2004   decompression  . COLONOSCOPY  04/1999   internal hemmroids,12-11-08 colon tics and hems only   . ESOPHAGOGASTRODUODENOSCOPY  04/1999   with esophagitis and stricture  . MOHS SURGERY  09/02/2008   L supratip of nose with flap,basal cell cancer [facility]/MOHS  . TONSILLECTOMY AND ADENOIDECTOMY     remote  . UPPER GASTROINTESTINAL ENDOSCOPY    . WRIST SURGERY  Right    13 years ago  . WRIST SURGERY     scar tissue removed    FAMILY HISTORY: Family History  Problem Relation Age of Onset  . Hypertension Mother   . Stroke Mother   . Pneumonia Father   . Bladder Cancer Father   . Cancer Maternal Aunt        ? type  . Breast cancer Maternal Aunt        mets  . Esophageal cancer Maternal  Aunt        aunt ? esophageal ca  . Stomach cancer Maternal Aunt        aunt ? stmach ca  . Colon cancer Maternal Aunt        had colostomy - not sure of origin  . Cancer Maternal Grandmother        ? type  . Prostate cancer Neg Hx   . Rectal cancer Neg Hx     ADVANCED DIRECTIVES (Y/N):  N  HEALTH MAINTENANCE: Social History   Tobacco Use  . Smoking status: Former Smoker    Types: Cigarettes, Cigars    Quit date: 08/22/1998    Years since quitting: 21.2  . Smokeless tobacco: Never Used  . Tobacco comment: cigarettes 1968, cigars 2000  Substance Use Topics  . Alcohol use: Yes    Alcohol/week: 0.0 standard drinks    Comment: rare  . Drug use: No     Colonoscopy:  PAP:  Bone density:  Lipid panel:  Allergies  Allergen Reactions  . Chlordiazepoxide-Clidinium     REACTION: urinary retention  . Citalopram Hydrobromide     Intolerant, tried 03/2011.  "It made me very angry".  . Erythromycin Base     REACTION: stomach upset  . Hyoscyamine Sulfate     REACTION: urinary retention  . Meloxicam     GI upset  . Penicillins     REACTION: Itching, rash  . Shrimp [Shellfish Allergy]     And crab- rash, GI upset, upper airway symptoms    Current Outpatient Medications  Medication Sig Dispense Refill  . apixaban (ELIQUIS) 5 MG TABS tablet Take 1 tablet (5 mg total) by mouth 2 (two) times daily. 60 tablet 5  . b complex vitamins capsule Take 1 capsule by mouth daily.    . Bimatoprost (LUMIGAN) 0.01 % SOLN Apply 1 drop to eye daily.      . cholecalciferol (VITAMIN D) 1000 UNITS tablet Take 4,000 Units by mouth daily.     Marland Kitchen doxycycline (VIBRA-TABS) 100 MG tablet Take 1 tablet (100 mg total) by mouth 2 (two) times daily. 20 tablet 0  . esomeprazole (NEXIUM) 40 MG capsule TAKE 1 CAPSULE BY MOUTH TWICE EVERY DAY 60 capsule 12  . fluticasone (FLONASE) 50 MCG/ACT nasal spray Place 2 sprays into both nostrils daily.    Marland Kitchen levocetirizine (XYZAL) 5 MG tablet Take 1 tablet (5 mg total)  by mouth every evening. 30 tablet 12  . metoprolol succinate (TOPROL XL) 25 MG 24 hr tablet Take 1 tablet (25 mg total) by mouth daily. 30 tablet 5  . tamsulosin (FLOMAX) 0.4 MG CAPS capsule TAKE 1-2 CAPSULES (0.4- 0.8 MG TOTAL) BY MOUTH DAILY. 60 capsule 12  . timolol (TIMOPTIC-XR) 0.5 % ophthalmic gel-forming Place 1 drop into the left eye every morning.    . vitamin C (ASCORBIC ACID) 500 MG tablet Take 500 mg by mouth daily.     No current facility-administered medications for this visit.  OBJECTIVE: There were no vitals filed for this visit.   There is no height or weight on file to calculate BMI.    ECOG FS:0 - Asymptomatic  General: Well-developed, well-nourished, no acute distress. HEENT: Normocephalic. Neuro: Alert, answering all questions appropriately. Cranial nerves grossly intact. Psych: Normal affect.  LAB RESULTS:  Lab Results  Component Value Date   NA 136 10/17/2019   K 4.3 10/17/2019   CL 99 10/17/2019   CO2 34 (H) 10/17/2019   GLUCOSE 115 (H) 10/17/2019   BUN 9 10/17/2019   CREATININE 0.95 10/17/2019   CALCIUM 9.0 10/17/2019   PROT 6.8 10/17/2019   ALBUMIN 3.6 10/17/2019   AST 27 10/17/2019   ALT 30 10/17/2019   ALKPHOS 67 10/17/2019   BILITOT 0.4 10/17/2019   GFRNONAA >60 10/04/2019   GFRAA >60 10/04/2019    Lab Results  Component Value Date   WBC 9.0 10/17/2019   NEUTROABS 5.5 10/17/2019   HGB 14.3 10/17/2019   HCT 42.6 10/17/2019   MCV 89.3 10/17/2019   PLT 330.0 10/17/2019     STUDIES: DG Chest 2 View  Result Date: 10/18/2019 CLINICAL DATA:  Cough for 3 weeks, former smoker, chest radiograph 09/18/2019 EXAM: CHEST - 2 VIEW COMPARISON:  CT chest 09/20/2019, chest radiograph 09/18/2019 FINDINGS: Coarse interstitial and chronic bronchitic changes are similar to priors. No consolidation, features of edema, pneumothorax, or effusion. The cardiomediastinal contours are unremarkable. No acute osseous or soft tissue abnormality. Degenerative  changes are present in the imaged spine and shoulders. IMPRESSION: No acute cardiopulmonary disease. Chronically coarsened interstitium and bronchitic changes. Electronically Signed   By: Kreg Shropshire M.D.   On: 10/18/2019 02:29   LONG TERM MONITOR (3-14 DAYS)  Result Date: 10/24/2019 Patient had a min HR of 49 bpm, max HR of 182 bpm, and avg HR of 74 bpm. Predominant underlying rhythm was Sinus Rhythm. 8 Supraventricular Tachycardia runs occurred, there was no evidence for atrial fibrillation.  some episodes of Supraventricular Isolated SVEs were rare (<1.0%), SVE Couplets were rare (<1.0%), and SVE Triplets were rare (<1.0%).  No evidence for atrial fibrillation on cardiac monitor.   ASSESSMENT: Portal vein thrombosis  PLAN:    1. Portal vein thrombosis: Unclear etiology.  Patient has no obvious evidence of malignancy.  Patient has an appointment with his primary GI physician within the next week.  He has a mildly elevated anticardiolipin IgM which is indeterminate at 16, but the remainder of his hypercoagulable work-up is either negative or within normal limits.  Patient has been instructed to complete 6 months of Eliquis and then discontinue at the end of July.  Will repeat anticardiolipin antibodies in the first week of September for completeness.  Patient will have video assisted telemedicine visit 1 week after to discuss the results.    I provided 20 minutes of face-to-face video visit time during this encounter which included chart review, counseling, and coordination of care as documented above.   Patient expressed understanding and was in agreement with this plan. He also understands that He can call clinic at any time with any questions, concerns, or complaints.    Jeralyn Ruths, MD   10/29/2019 3:28 PM

## 2019-10-28 ENCOUNTER — Telehealth: Payer: Self-pay | Admitting: Internal Medicine

## 2019-10-28 NOTE — Telephone Encounter (Signed)
Pt aware and will keep appt as scheduled 

## 2019-10-28 NOTE — Telephone Encounter (Signed)
Pt states that he was in the hospital in Jan with diverticulitis. Reports since Feb he has had an irritating cough thought to be sinus/bronchitis related. He has been placed on an antibiotic. States at night he runs a low grade temp of 99.4-100.2 and he has night sweats. States 2 weeks ago he had some discomfort in his LLQ and for 2 days he only had gas passing but now his bowels are moving fine again. Pt wanted to make sure this would not interfere with his procedure that is scheduled. Please advise.

## 2019-10-28 NOTE — Telephone Encounter (Signed)
In terms of his abdomen, okay to proceed.  In terms of respiratory symptoms, bothers him.  If not much, then proceed.  If a lot, reschedule and see PCP until he is feeling well enough to proceed.

## 2019-10-28 NOTE — Telephone Encounter (Signed)
Pt stated that he has a hx of diverticulitis.  He reported that two weeks ago, he had "nothing moving for two days but gas."  FYI.

## 2019-10-29 ENCOUNTER — Telehealth: Payer: Self-pay | Admitting: *Deleted

## 2019-10-29 ENCOUNTER — Inpatient Hospital Stay: Payer: Medicare Other | Attending: Oncology | Admitting: Oncology

## 2019-10-29 DIAGNOSIS — I81 Portal vein thrombosis: Secondary | ICD-10-CM

## 2019-10-29 NOTE — Telephone Encounter (Signed)
-----   Message from Kate Sable, MD sent at 10/24/2019  5:13 PM EST ----- No evidence for atrial fibrillation on cardiac monitor.  Keep follow-up appointment.

## 2019-10-29 NOTE — Telephone Encounter (Signed)
Results called to pt. Pt verbalized understanding of results. He asked if he needed to continue the metoprolol succinate since he was not shown to be in aFIb.  Patient reports he cannot tell a difference in the way he feels with taking it. Advised I will ask Dr. Garen Lah for advice and let him know.

## 2019-10-30 ENCOUNTER — Telehealth: Payer: Self-pay | Admitting: Family Medicine

## 2019-10-30 ENCOUNTER — Encounter: Payer: Self-pay | Admitting: Family Medicine

## 2019-10-30 ENCOUNTER — Telehealth: Payer: Self-pay | Admitting: Internal Medicine

## 2019-10-30 NOTE — Telephone Encounter (Signed)
Kate Sable, MD  You 2 hours ago (1:18 PM)   Yes, recommend patient should continue taking Toprol-XL as prescribed. He has paroxysmal atrial fibrillation and metoprolol XL will help keep heart rates controlled whenever and if he goes into atrial fibrillation.     Called patient and he verbalized understanding of the recommendations and that he should continue to take the Toprol XL daily.

## 2019-10-30 NOTE — Telephone Encounter (Signed)
Patient called about his ENT referral to check status Patient would like the [place] location   He stated that he is not available this Friday or next Tuesday  But any other day or time is fine

## 2019-10-30 NOTE — Telephone Encounter (Signed)
This mutual patient is having persistent cough and night sweats and I would appreciate any guidance that you have on the matter.  We are setting him up with ENT for an incidental issue with tongue discoloration.  Thank you.

## 2019-10-31 NOTE — Telephone Encounter (Signed)
Patient advised.

## 2019-10-31 NOTE — Telephone Encounter (Signed)
Just had a video chat with him earlier this week and he did not mention those symptoms. With negative CT scans in January, I have no obvious evidence of malignancy. I think he still needs and EGD and colonoscopy to be sure and he has an appt with his GI in the near future.  They should also be able to assess whether there is some sort of inflammatory process going on in his liver.  Not sure a blind liver biopsy to assess would be helpful, but its a thought. Or maybe this is some Rheum issue?

## 2019-10-31 NOTE — Telephone Encounter (Signed)
Contacted the patient and discussed switching prep to plenvue. Patients insurance will not cover the cost of Suprep. Will place a sample of Plenvue with instructions at the front desk and he will pick it up tomorrow.

## 2019-10-31 NOTE — Telephone Encounter (Signed)
Thank you for your note.  We will update the patient.  I think it makes sense to get through the GI appointment and ENT consult and then go from there. -------------- [place], please contact patient with the plan to follow-up with GI and then ENT as soon as he can and go from there.  Thanks.

## 2019-11-01 ENCOUNTER — Ambulatory Visit (INDEPENDENT_AMBULATORY_CARE_PROVIDER_SITE_OTHER): Payer: Medicare Other

## 2019-11-01 ENCOUNTER — Other Ambulatory Visit: Payer: Self-pay | Admitting: Internal Medicine

## 2019-11-01 DIAGNOSIS — Z1159 Encounter for screening for other viral diseases: Secondary | ICD-10-CM

## 2019-11-02 LAB — SARS CORONAVIRUS 2 (TAT 6-24 HRS): SARS Coronavirus 2: NEGATIVE

## 2019-11-05 ENCOUNTER — Encounter: Payer: Self-pay | Admitting: Internal Medicine

## 2019-11-05 ENCOUNTER — Other Ambulatory Visit: Payer: Self-pay

## 2019-11-05 ENCOUNTER — Ambulatory Visit (AMBULATORY_SURGERY_CENTER): Payer: Medicare Other | Admitting: Internal Medicine

## 2019-11-05 VITALS — BP 122/72 | HR 87 | Temp 96.8°F | Resp 19 | Ht 69.5 in | Wt 168.0 lb

## 2019-11-05 DIAGNOSIS — R935 Abnormal findings on diagnostic imaging of other abdominal regions, including retroperitoneum: Secondary | ICD-10-CM

## 2019-11-05 DIAGNOSIS — K5792 Diverticulitis of intestine, part unspecified, without perforation or abscess without bleeding: Secondary | ICD-10-CM | POA: Diagnosis not present

## 2019-11-05 DIAGNOSIS — D122 Benign neoplasm of ascending colon: Secondary | ICD-10-CM | POA: Diagnosis not present

## 2019-11-05 MED ORDER — SODIUM CHLORIDE 0.9 % IV SOLN: 500.0000 mL | Freq: Once | INTRAVENOUS | Status: DC

## 2019-11-05 NOTE — Op Note (Signed)
[facility] Patient Name: Mason [name] Procedure Date: 11/05/2019 1:32 PM MRN: [id number] Endoscopist: Wilhemina Bonito. Marina Goodell , MD Age: 74 Referring MD:  Date of Birth: May 09, 1946 Gender: Male Account #: [id number] Procedure:                Colonoscopy with cold snare polypectomy x 1 Indications:              Abnormal CT of the GI tract. Diverticulitis;                            vascular thrombosis. Rule out underlying                            malignancy. Previous colonoscopy examinations 2000,                            2005, 2010, and 2016 all negative for neoplasia Medicines:                Monitored Anesthesia Care Procedure:                Pre-Anesthesia Assessment:                           - Prior to the procedure, a History and Physical                            was performed, and patient medications and                            allergies were reviewed. The patient's tolerance of                            previous anesthesia was also reviewed. The risks                            and benefits of the procedure and the sedation                            options and risks were discussed with the patient.                            All questions were answered, and informed consent                            was obtained. Prior Anticoagulants: The patient has                            taken Eliquis (apixaban), last dose was 2 days                            prior to procedure. ASA Grade Assessment: III - A                            patient with severe systemic disease. After  reviewing the risks and benefits, the patient was                            deemed in satisfactory condition to undergo the                            procedure.                           After obtaining informed consent, the colonoscope                            was passed under direct vision. Throughout the                            procedure, the patient's blood  pressure, pulse, and                            oxygen saturations were monitored continuously. The                            Colonoscope was introduced through the anus and                            advanced to the the cecum, identified by                            appendiceal orifice and ileocecal valve. The                            terminal ileum, ileocecal valve, appendiceal                            orifice, and rectum were photographed. The quality                            of the bowel preparation was excellent. The                            colonoscopy was performed without difficulty. The                            patient tolerated the procedure well. The bowel                            preparation used was SUPREP via split dose                            instruction. Scope In: 1:47:25 PM Scope Out: 2:01:47 PM Scope Withdrawal Time: 0 hours 11 minutes 42 seconds  Total Procedure Duration: 0 hours 14 minutes 22 seconds  Findings:                 The terminal ileum appeared normal.  A 5 mm polyp was found in the ascending colon. The                            polyp was sessile. The polyp was removed with a                            cold snare. Resection and retrieval were complete.                           Multiple small and large-mouthed diverticula were                            found in the sigmoid colon.                           Internal hemorrhoids were found during                            retroflexion. The hemorrhoids were moderate.                           The exam was otherwise without abnormality on                            direct and retroflexion views. Complications:            No immediate complications. Estimated blood loss:                            None. Estimated Blood Loss:     Estimated blood loss: none. Impression:               - The examined portion of the ileum was normal.                           - One 5 mm  polyp in the ascending colon, removed                            with a cold snare. Resected and retrieved.                           - Diverticulosis in the sigmoid colon.                           - Internal hemorrhoids.                           - The examination was otherwise normal on direct                            and retroflexion views. Recommendation:           - Repeat colonoscopy is not recommended for                            surveillance.                           -  Resume Eliquis (apixaban) today at prior dose.                           - Patient has a contact number available for                            emergencies. The signs and symptoms of potential                            delayed complications were discussed with the                            patient. Return to normal activities tomorrow.                            Written discharge instructions were provided to the                            patient.                           - Resume previous diet.                           - Continue present medications.                           - Await pathology results. Wilhemina Bonito. Marina Goodell, MD 11/05/2019 2:18:15 PM This report has been signed electronically.

## 2019-11-05 NOTE — Patient Instructions (Signed)
Discharge instructions given. Handouts on polyps,diverticulosis and hemorrhoids. Resume previous medications. YOU HAD AN ENDOSCOPIC PROCEDURE TODAY AT [facility]:   Refer to the procedure report that was given to you for any specific questions about what was found during the examination.  If the procedure report does not answer your questions, please call your gastroenterologist to clarify.  If you requested that your care partner not be given the details of your procedure findings, then the procedure report has been included in a sealed envelope for you to review at your convenience later.  YOU SHOULD EXPECT: Some feelings of bloating in the abdomen. Passage of more gas than usual.  Walking can help get rid of the air that was put into your GI tract during the procedure and reduce the bloating. If you had a lower endoscopy (such as a colonoscopy or flexible sigmoidoscopy) you may notice spotting of blood in your stool or on the toilet paper. If you underwent a bowel prep for your procedure, you may not have a normal bowel movement for a few days.  Please Note:  You might notice some irritation and congestion in your nose or some drainage.  This is from the oxygen used during your procedure.  There is no need for concern and it should clear up in a day or so.  SYMPTOMS TO REPORT IMMEDIATELY:   Following lower endoscopy (colonoscopy or flexible sigmoidoscopy):  Excessive amounts of blood in the stool  Significant tenderness or worsening of abdominal pains  Swelling of the abdomen that is new, acute  Fever of 100F or higher   For urgent or emergent issues, a gastroenterologist can be reached at any hour by calling [phone]. Do not use MyChart messaging for urgent concerns.    DIET:  We do recommend a small meal at first, but then you may proceed to your regular diet.  Drink plenty of fluids but you should avoid alcoholic beverages for 24 hours.  ACTIVITY:  You should  plan to take it easy for the rest of today and you should NOT DRIVE or use heavy machinery until tomorrow (because of the sedation medicines used during the test).    FOLLOW UP: Our staff will call the number listed on your records 48-72 hours following your procedure to check on you and address any questions or concerns that you may have regarding the information given to you following your procedure. If we do not reach you, we will leave a message.  We will attempt to reach you two times.  During this call, we will ask if you have developed any symptoms of COVID 19. If you develop any symptoms (ie: fever, flu-like symptoms, shortness of breath, cough etc.) before then, please call [phone].  If you test positive for Covid 19 in the 2 weeks post procedure, please call and report this information to [place].    If any biopsies were taken you will be contacted by phone or by letter within the next 1-3 weeks.  Please call us at [phone] if you have not heard about the biopsies in 3 weeks.    SIGNATURES/CONFIDENTIALITY: You and/or your care partner have signed paperwork which will be entered into your electronic medical record.  These signatures attest to the fact that that the information above on your After Visit Summary has been reviewed and is understood.  Full responsibility of the confidentiality of this discharge information lies with you and/or your care-partner.

## 2019-11-05 NOTE — Progress Notes (Signed)
Called to room to assist during endoscopic procedure.  Patient ID and intended procedure confirmed with present staff. Received instructions for my participation in the procedure from the performing physician.  

## 2019-11-05 NOTE — Progress Notes (Signed)
Temp-JR VS- CW

## 2019-11-05 NOTE — Progress Notes (Signed)
PT taken to PACU. Monitors in place. VSS. Report given to RN. 

## 2019-11-07 ENCOUNTER — Telehealth: Payer: Self-pay

## 2019-11-07 NOTE — Telephone Encounter (Signed)
Left message on follow up call. 

## 2019-11-07 NOTE — Telephone Encounter (Signed)
  Follow up Call-  Call back number 11/05/2019  Post procedure Call Back phone  # [phone]  Permission to leave phone message Yes  Some recent data might be hidden     Patient questions:  Do you have a fever, pain , or abdominal swelling? No. Pain Score  0 *  Have you tolerated food without any problems? Yes.    Have you been able to return to your normal activities? Yes.    Do you have any questions about your discharge instructions: Diet   No. Medications  No. Follow up visit  No.  Do you have questions or concerns about your Care? No.  Actions: * If pain score is 4 or above: No action needed, pain <4.  1. Have you developed a fever since your procedure? no  2.   Have you had an respiratory symptoms (SOB or cough) since your procedure? no  3.   Have you tested positive for COVID 19 since your procedure no  4.   Have you had any family members/close contacts diagnosed with the COVID 19 since your procedure?  no   If yes to any of these questions please route to Joylene John, RN and Alphonsa Gin, Therapist, sports.

## 2019-11-11 ENCOUNTER — Encounter: Payer: Self-pay | Admitting: Internal Medicine

## 2019-11-12 ENCOUNTER — Other Ambulatory Visit: Payer: Self-pay | Admitting: *Deleted

## 2019-11-12 ENCOUNTER — Telehealth: Payer: Self-pay | Admitting: Family Medicine

## 2019-11-12 ENCOUNTER — Telehealth: Payer: Self-pay | Admitting: *Deleted

## 2019-11-12 MED ORDER — ESOMEPRAZOLE MAGNESIUM 40 MG PO CPDR: DELAYED_RELEASE_CAPSULE | ORAL | 0 refills | Status: DC

## 2019-11-12 MED ORDER — ESOMEPRAZOLE MAGNESIUM 40 MG PO CPDR: DELAYED_RELEASE_CAPSULE | ORAL | 3 refills | Status: DC

## 2019-11-12 NOTE — Telephone Encounter (Signed)
Patient called about his esomeprazole (NEXIUM) 40 MG capsule  He stated he is using [facility] now [facility] of [facility] and when he tried to get them to refill this they blocked it. Patient stated he spoke with them and that they would be sending over information to our office about getting in contact with them to have it filled.  Patient stated that he will be out of medication on Sunday and would like to taken care of before the weekend.

## 2019-11-12 NOTE — Telephone Encounter (Signed)
Rx sent to [facility]. 

## 2019-11-13 ENCOUNTER — Encounter: Payer: Self-pay | Admitting: Family Medicine

## 2019-11-13 NOTE — Telephone Encounter (Signed)
Nickole @ [facility] called pt needs prior autho on rx  For  Time vs quantity issues  Best number prior autho number [phone]  Pt only has enough meds to last till Saturday  nickole will send electronic prior

## 2019-11-13 NOTE — Telephone Encounter (Signed)
FYI

## 2019-11-13 NOTE — Telephone Encounter (Signed)
Patient called about medication and Prior Auth He would like a call back for an update.   Patient is concerned that he is going to run out of medication before this is taken care of

## 2019-11-13 NOTE — Telephone Encounter (Addendum)
PA initiated with [facility] for Generic Nexium 40mg  Dx GERD  K21.9  Medication has been approved through 11/12/2020  Called and left a detailed message for the patient making them aware this has been approved and he can contact the pharmacy to have them process this medication and fill this today.   Will send back to Lugene's basket to follow up with patient to ensure they received the voicemail.

## 2019-11-13 NOTE — Telephone Encounter (Signed)
PA is being initiated Will document as as determination is met

## 2019-11-14 NOTE — Telephone Encounter (Signed)
Advised pt of PA approval. Pt has already picked up script.

## 2019-12-06 ENCOUNTER — Ambulatory Visit: Payer: Medicare Other | Admitting: Neurology

## 2020-01-01 ENCOUNTER — Other Ambulatory Visit: Payer: Medicare Other

## 2020-01-13 ENCOUNTER — Other Ambulatory Visit: Payer: Self-pay

## 2020-01-21 HISTORY — PX: MOUTH SURGERY: SHX715

## 2020-03-16 ENCOUNTER — Other Ambulatory Visit: Payer: Self-pay | Admitting: *Deleted

## 2020-03-16 DIAGNOSIS — I480 Paroxysmal atrial fibrillation: Secondary | ICD-10-CM

## 2020-03-16 MED ORDER — METOPROLOL SUCCINATE ER 25 MG PO TB24: 25.0000 mg | ORAL_TABLET | Freq: Every day | ORAL | 1 refills | Status: DC

## 2020-03-16 NOTE — Telephone Encounter (Signed)
Received fax request for Metoprolol 25 mg once a day. Refill faxed to pharmacy on file per request for #30 with 1 refill

## 2020-03-30 ENCOUNTER — Encounter: Payer: Self-pay | Admitting: Cardiology

## 2020-03-30 ENCOUNTER — Other Ambulatory Visit: Payer: Self-pay | Admitting: Family Medicine

## 2020-03-30 ENCOUNTER — Ambulatory Visit (INDEPENDENT_AMBULATORY_CARE_PROVIDER_SITE_OTHER): Payer: Medicare Other | Admitting: Cardiology

## 2020-03-30 ENCOUNTER — Other Ambulatory Visit: Payer: Self-pay

## 2020-03-30 VITALS — BP 140/70 | HR 58 | Ht 69.5 in | Wt 170.2 lb

## 2020-03-30 DIAGNOSIS — R030 Elevated blood-pressure reading, without diagnosis of hypertension: Secondary | ICD-10-CM | POA: Diagnosis not present

## 2020-03-30 DIAGNOSIS — I81 Portal vein thrombosis: Secondary | ICD-10-CM | POA: Diagnosis not present

## 2020-03-30 DIAGNOSIS — I480 Paroxysmal atrial fibrillation: Secondary | ICD-10-CM

## 2020-03-30 MED ORDER — APIXABAN 5 MG PO TABS: 5.0000 mg | ORAL_TABLET | Freq: Two times a day (BID) | ORAL | 5 refills | Status: DC

## 2020-03-30 MED ORDER — METOPROLOL SUCCINATE ER 25 MG PO TB24: 12.5000 mg | ORAL_TABLET | Freq: Every day | ORAL | 1 refills | Status: DC

## 2020-03-30 NOTE — Patient Instructions (Signed)
Medication Instructions:   Your physician has recommended you make the following change in your medication:   DECREASE your metoprolol succinate (TOPROL XL) 25 MG: Take 0.5 tablets (12.5 mg total) by mouth daily.  *If you need a refill on your cardiac medications before your next appointment, please call your pharmacy*   Lab Work: None Ordered If you have labs (blood work) drawn today and your tests are completely normal, you will receive your results only by: Marland Kitchen MyChart Message (if you have MyChart) OR . A paper copy in the mail If you have any lab test that is abnormal or we need to change your treatment, we will call you to review the results.   Testing/Procedures: None Ordered   Follow-Up: At [facility], you and your health needs are our priority.  As part of our continuing mission to provide you with exceptional heart care, we have created designated Provider Care Teams.  These Care Teams include your primary Cardiologist (physician) and Advanced Practice Providers (APPs -  Physician Assistants and Nurse Practitioners) who all work together to provide you with the care you need, when you need it.  We recommend signing up for the patient portal called "MyChart".  Sign up information is provided on this After Visit Summary.  MyChart is used to connect with patients for Virtual Visits (Telemedicine).  Patients are able to view lab/test results, encounter notes, upcoming appointments, etc.  Non-urgent messages can be sent to your provider as well.   To learn more about what you can do with MyChart, go to [email or web address].    Your next appointment:   6 month(s)  The format for your next appointment:   In Person  Provider:   Kate Sable, MD   Other Instructions

## 2020-03-30 NOTE — Progress Notes (Signed)
Cardiology Office Note:    Date:  03/30/2020   ID:  Mason [name], Mason [name] 04/22/1946, MRN [id number]  PCP:  Joaquim Nam, MD  Cardiologist:  No primary care provider on file.  Electrophysiologist:  None   Referring MD: Joaquim Nam, MD   Chief Complaint  Patient presents with  . other    6 month follow up. Meds reviewed by the pt. verbally. "doing well."     History of Present Illness:    Mason [name] is a 74 y.o. male with a hx of paroxysmal atrial fibrillation on Eliquis, hypertension, portal venous thrombosis, anxiety who presents for follow-up for A. fib management.  Takes Toprol-XL and Eliquis.  Compliant with all medications.  Denies palpitations or shortness of breath.  He feels well, has upcoming appointment with hematology.  Previous work-up regarding portal vein thrombosis did not reveal any malignancy.  Prior notes Patient has history of abdominal discomfort, abdominal imaging with CT abdomen pelvis with contrast revealed acute diverticulitis and portal vein thrombosis.  He was started on antibiotics and Eliquis.  He was seen by GI and hematology.  Anticoagulation for 3 to 6 months was recommended.  Patient later on noticed to have intermittent tachycardia on telemetry.  EKG on 09/20/2019 showed atrial fibrillation with heart rate 109.  Subsequent ECGs showed sinus rhythm. Echocardiogram obtained on 08/2019 showed low normal ejection fraction of 50 to 55%, left and right atrial sizes were normal.  Past Medical History:  Diagnosis Date  . Allergy   . Anal fissure   . Anxiety   . Cancer (HCC)    basal cell removed x4  . Cataract    bil eyes  . Depression   . Diverticulosis of colon   . GERD (gastroesophageal reflux disease)    esophageal narrowing  . Glaucoma   . Hiatal hernia   . IBS (irritable bowel syndrome)   . Insomnia   . Internal hemorrhoids     Past Surgical History:  Procedure Laterality Date  . ANAL SPHINCTEROTOMY  03/1995   fistulotomy   . APPENDECTOMY    . BASAL CELL CARCINOMA EXCISION     on back  . BICEPT TENODESIS Right 09/27/2004   decompression  . COLONOSCOPY  04/1999   internal hemmroids,12-11-08 colon tics and hems only   . ESOPHAGOGASTRODUODENOSCOPY  04/1999   with esophagitis and stricture  . MOHS SURGERY  09/02/2008   L supratip of nose with flap,basal cell cancer [facility]/MOHS  . TONGUE SURGERY  2017   had small place removed suspected of cancer  . TONSILLECTOMY AND ADENOIDECTOMY     remote  . UPPER GASTROINTESTINAL ENDOSCOPY    . WRIST SURGERY Right    13 years ago  . WRIST SURGERY     scar tissue removed    Current Medications: Current Meds  Medication Sig  . apixaban (ELIQUIS) 5 MG TABS tablet Take 1 tablet (5 mg total) by mouth 2 (two) times daily.  Marland Kitchen b complex vitamins capsule Take 1 capsule by mouth daily.  . Bimatoprost (LUMIGAN) 0.01 % SOLN Apply 1 drop to eye daily.    . cholecalciferol (VITAMIN D) 1000 UNITS tablet Take 4,000 Units by mouth daily.   Marland Kitchen esomeprazole (NEXIUM) 40 MG capsule TAKE 1 CAPSULE BY MOUTH TWICE EVERY DAY  . fluticasone (FLONASE) 50 MCG/ACT nasal spray Place 2 sprays into both nostrils daily.  Marland Kitchen levocetirizine (XYZAL) 5 MG tablet Take 1 tablet (5 mg total) by mouth every evening.  . metoprolol  succinate (TOPROL XL) 25 MG 24 hr tablet Take 0.5 tablets (12.5 mg total) by mouth daily. Please keep upcoming scheduled appointment.  . tamsulosin (FLOMAX) 0.4 MG CAPS capsule TAKE 1-2 CAPSULES (0.4- 0.8 MG TOTAL) BY MOUTH DAILY.  Marland Kitchen timolol (TIMOPTIC-XR) 0.5 % ophthalmic gel-forming Place 1 drop into the left eye every morning.  . vitamin C (ASCORBIC ACID) 500 MG tablet Take 500 mg by mouth daily.  . [DISCONTINUED] apixaban (ELIQUIS) 5 MG TABS tablet Take 1 tablet (5 mg total) by mouth 2 (two) times daily.  . [DISCONTINUED] metoprolol succinate (TOPROL XL) 25 MG 24 hr tablet Take 1 tablet (25 mg total) by mouth daily. Please keep upcoming scheduled appointment.     Allergies:    Chlordiazepoxide-clidinium, Citalopram hydrobromide, Erythromycin base, Hyoscyamine sulfate, Meloxicam, Penicillins, and Shrimp [shellfish allergy]   Social History   Socioeconomic History  . Marital status: Divorced    Spouse name: Not on file  . Number of children: 1  . Years of education: Not on file  . Highest education level: Not on file  Occupational History  . Occupation: Art therapist, retired 1997    Employer: [employer]  Tobacco Use  . Smoking status: Former Smoker    Types: Cigarettes, Cigars    Quit date: 08/22/1998    Years since quitting: 21.6  . Smokeless tobacco: Never Used  . Tobacco comment: cigarettes 1968, cigars 2000  Vaping Use  . Vaping Use: Never used  Substance and Sexual Activity  . Alcohol use: Yes    Alcohol/week: 0.0 standard drinks    Comment: rare  . Drug use: No  . Sexual activity: Never  Other Topics Concern  . Not on file  Social History Narrative   Retired from [employer]   From [place]   Prev divorced    1 son   [employer] '63-'67, no known agent orange exposure   Social Determinants of Corporate investment banker Strain:   . Difficulty of Paying Living Expenses:   Food Insecurity:   . Worried About Programme researcher, broadcasting/film/video in the Last Year:   . Barista in the Last Year:   Transportation Needs:   . Freight forwarder (Medical):   Marland Kitchen Lack of Transportation (Non-Medical):   Physical Activity:   . Days of Exercise per Week:   . Minutes of Exercise per Session:   Stress:   . Feeling of Stress :   Social Connections:   . Frequency of Communication with Friends and Family:   . Frequency of Social Gatherings with Friends and Family:   . Attends Religious Services:   . Active Member of Clubs or Organizations:   . Attends Banker Meetings:   Marland Kitchen Marital Status:      Family History: The patient's family history includes Bladder Cancer in his father; Breast cancer in his maternal aunt; Cancer in his maternal aunt and  maternal grandmother; Colon cancer in his maternal aunt; Esophageal cancer in his maternal aunt; Hypertension in his mother; Pneumonia in his father; Stomach cancer in his maternal aunt; Stroke in his mother. There is no history of Prostate cancer or Rectal cancer.  ROS:   Please see the history of present illness.     All other systems reviewed and are negative.  EKGs/Labs/Other Studies Reviewed:    The following studies were reviewed today:   EKG:  EKG is  ordered today.  The ekg ordered today demonstrates sinus bradycardia  Recent Labs: 09/21/2019: Magnesium 1.9  10/17/2019: ALT 30; BUN 9; Creatinine, Ser 0.95; Hemoglobin 14.3; Platelets 330.0; Potassium 4.3; Sodium 136  Recent Lipid Panel    Component Value Date/Time   CHOL 155 06/28/2019 0757   TRIG 98.0 06/28/2019 0757   HDL 39.20 06/28/2019 0757   CHOLHDL 4 06/28/2019 0757   VLDL 19.6 06/28/2019 0757   LDLCALC 96 06/28/2019 0757    Physical Exam:    VS:  BP 140/70 (BP Location: Left Arm, Patient Position: Sitting, Cuff Size: Normal)   Pulse (!) 58   Ht 5' 9.5" (1.765 m)   Wt 170 lb 4 oz (77.2 kg)   SpO2 98%   BMI 24.78 kg/m     Wt Readings from Last 3 Encounters:  03/30/20 170 lb 4 oz (77.2 kg)  11/05/19 168 lb (76.2 kg)  10/17/19 171 lb 6 oz (77.7 kg)     GEN:  Well nourished, well developed in no acute distress HEENT: Normal NECK: No JVD; No carotid bruits LYMPHATICS: No lymphadenopathy CARDIAC: RRR, no murmurs, rubs, gallops RESPIRATORY:  Clear to auscultation without rales, wheezing or rhonchi  ABDOMEN: Soft, non-tender, non-distended MUSCULOSKELETAL:  No edema; No deformity  SKIN: Warm and dry NEUROLOGIC:  Alert and oriented x 3 PSYCHIATRIC:  Normal affect   ASSESSMENT:    1. Paroxysmal atrial fibrillation (HCC)   2. Elevated BP without diagnosis of hypertension   3. Portal vein thrombosis    PLAN:    In order of problems listed above:  1. Patient with history of paroxysmal atrial  fibrillation.  Currently in sinus rhythm.  CHA2DS2-VASc score equals 2.  Prior echo showed normal EF, EF 50 to 55%.  Continue Eliquis 5 mg twice daily.  Heart rate 58, decrease Toprol-XL to 12.5 mg daily.  Refill Eliquis.  Start Toprol-XL 25 mg daily.  2-week cardiac monitor did not reveal any evidence for A. fib.  Okay to continue Eliquis for now.  Patient has no adverse effects.  2. Elevated BP without diagnosis of hypertension.  BP reasonable for 74 year old.  Continue Toprol. 3. History of portal vein thrombosis, malignancy work-up per hematology has been negative.  Keep follow-up per primary team.  Follow-up in 6 months.  This note was generated in part or whole with voice recognition software. Voice recognition is usually quite accurate but there are transcription errors that can and very often do occur. I apologize for any typographical errors that were not detected and corrected.  Medication Adjustments/Labs and Tests Ordered: Current medicines are reviewed at length with the patient today.  Concerns regarding medicines are outlined above.  Orders Placed This Encounter  Procedures  . EKG 12-Lead   Meds ordered this encounter  Medications  . metoprolol succinate (TOPROL XL) 25 MG 24 hr tablet    Sig: Take 0.5 tablets (12.5 mg total) by mouth daily. Please keep upcoming scheduled appointment.    Dispense:  30 tablet    Refill:  1  . apixaban (ELIQUIS) 5 MG TABS tablet    Sig: Take 1 tablet (5 mg total) by mouth 2 (two) times daily.    Dispense:  60 tablet    Refill:  5    Patient Instructions  Medication Instructions:   Your physician has recommended you make the following change in your medication:   DECREASE your metoprolol succinate (TOPROL XL) 25 MG: Take 0.5 tablets (12.5 mg total) by mouth daily.  *If you need a refill on your cardiac medications before your next appointment, please call your pharmacy*   Lab  Work: None Ordered If you have labs (blood work) drawn  today and your tests are completely normal, you will receive your results only by: Marland Kitchen MyChart Message (if you have MyChart) OR . A paper copy in the mail If you have any lab test that is abnormal or we need to change your treatment, we will call you to review the results.   Testing/Procedures: None Ordered   Follow-Up: At [facility], you and your health needs are our priority.  As part of our continuing mission to provide you with exceptional heart care, we have created designated Provider Care Teams.  These Care Teams include your primary Cardiologist (physician) and Advanced Practice Providers (APPs -  Physician Assistants and Nurse Practitioners) who all work together to provide you with the care you need, when you need it.  We recommend signing up for the patient portal called "MyChart".  Sign up information is provided on this After Visit Summary.  MyChart is used to connect with patients for Virtual Visits (Telemedicine).  Patients are able to view lab/test results, encounter notes, upcoming appointments, etc.  Non-urgent messages can be sent to your provider as well.   To learn more about what you can do with MyChart, go to [email or web address].    Your next appointment:   6 month(s)  The format for your next appointment:   In Person  Provider:   Debbe Odea, MD   Other Instructions      Signed, Debbe Odea, MD  03/30/2020 1:07 PM    [facility]

## 2020-04-29 ENCOUNTER — Inpatient Hospital Stay: Payer: Medicare Other | Attending: Oncology

## 2020-04-29 ENCOUNTER — Other Ambulatory Visit: Payer: Self-pay

## 2020-04-29 DIAGNOSIS — I81 Portal vein thrombosis: Secondary | ICD-10-CM | POA: Insufficient documentation

## 2020-04-30 ENCOUNTER — Ambulatory Visit (INDEPENDENT_AMBULATORY_CARE_PROVIDER_SITE_OTHER): Payer: Medicare Other | Admitting: Dermatology

## 2020-04-30 ENCOUNTER — Encounter: Payer: Self-pay | Admitting: Dermatology

## 2020-04-30 DIAGNOSIS — D229 Melanocytic nevi, unspecified: Secondary | ICD-10-CM

## 2020-04-30 DIAGNOSIS — L720 Epidermal cyst: Secondary | ICD-10-CM

## 2020-04-30 DIAGNOSIS — L821 Other seborrheic keratosis: Secondary | ICD-10-CM

## 2020-04-30 DIAGNOSIS — D1800 Hemangioma unspecified site: Secondary | ICD-10-CM

## 2020-04-30 DIAGNOSIS — Z1283 Encounter for screening for malignant neoplasm of skin: Secondary | ICD-10-CM

## 2020-04-30 DIAGNOSIS — Z85828 Personal history of other malignant neoplasm of skin: Secondary | ICD-10-CM | POA: Diagnosis not present

## 2020-04-30 DIAGNOSIS — L820 Inflamed seborrheic keratosis: Secondary | ICD-10-CM

## 2020-04-30 DIAGNOSIS — D692 Other nonthrombocytopenic purpura: Secondary | ICD-10-CM

## 2020-04-30 DIAGNOSIS — L578 Other skin changes due to chronic exposure to nonionizing radiation: Secondary | ICD-10-CM

## 2020-04-30 DIAGNOSIS — L814 Other melanin hyperpigmentation: Secondary | ICD-10-CM

## 2020-04-30 LAB — CARDIOLIPIN ANTIBODIES, IGG, IGM, IGA
Anticardiolipin IgA: <9 APL U/mL (ref 0–11)
Anticardiolipin IgG: <9 GPL U/mL (ref 0–14)
Anticardiolipin IgM: 10 MPL U/mL (ref 0–12)

## 2020-04-30 NOTE — Progress Notes (Signed)
Follow-Up Visit   Subjective  Mason [name] is a 74 y.o. male who presents for the following: Annual Exam. Hx of BCC and AK's. Patient has noticed a lesion on his R chin area that he is concerned about and would like checked. He also has other lesions scattered on the trunk and extremities that are irregular and irritated.  The patient presents for Total-Body Skin Exam (TBSE) for skin cancer screening and mole check.  The following portions of the chart were reviewed this encounter and updated as appropriate:  Tobacco  Allergies  Meds  Problems  Med Hx  Surg Hx  Fam Hx     Review of Systems:  No other skin or systemic complaints except as noted in HPI or Assessment and Plan.  Objective  Well appearing patient in no apparent distress; mood and affect are within normal limits.  A full examination was performed including scalp, head, eyes, ears, nose, lips, neck, chest, axillae, abdomen, back, buttocks, bilateral upper extremities, bilateral lower extremities, hands, feet, fingers, toes, fingernails, and toenails. All findings within normal limits unless otherwise noted below.  Objective  R chin oral commisure: Subcutaneous nodule. 0.4 cm   Objective  R neck x 2, L chest x 1, R arm x 1 (4): Erythematous keratotic or waxy stuck-on papule or plaque.   Assessment & Plan  Epidermal inclusion cyst R chin oral commisure  Skin excision - R chin oral commisure  Lesion length (cm):  0.4 Lesion width (cm):  0.4 Total excision diameter (cm):  0.4 Informed consent: discussed and consent obtained   Timeout: patient name, date of birth, surgical site, and procedure verified   Procedure prep:  Patient was prepped and draped in usual sterile fashion Prep type:  Isopropyl alcohol and povidone-iodine Anesthesia: the lesion was anesthetized in a standard fashion   Anesthetic:  1% lidocaine w/ epinephrine 1-100,000 buffered w/ 8.4% NaHCO3 Instrument used: #11 blade   Hemostasis  achieved with: pressure and aluminum chloride   Hemostasis achieved with comment:  Electrocautery Outcome: patient tolerated procedure well with no complications   Post-procedure details: sterile dressing applied and wound care instructions given   Dressing type: bandage and pressure dressing   Additional details:  Simple excision today   Inflamed seborrheic keratosis (4) R neck x 2, L chest x 1, R arm x 1  Destruction of lesion - R neck x 2, L chest x 1, R arm x 1 Complexity: simple   Destruction method: cryotherapy   Informed consent: discussed and consent obtained   Timeout:  patient name, date of birth, surgical site, and procedure verified Lesion destroyed using liquid nitrogen: Yes   Region frozen until ice ball extended beyond lesion: Yes   Outcome: patient tolerated procedure well with no complications   Post-procedure details: wound care instructions given    Skin cancer screening   Lentigines - Scattered tan macules - Discussed due to sun exposure - Benign, observe - Call for any changes  Seborrheic Keratoses - Stuck-on, waxy, tan-brown papules and plaques  - Discussed benign etiology and prognosis. - Observe - Call for any changes  Melanocytic Nevi - Tan-brown and/or pink-flesh-colored symmetric macules and papules - Benign appearing on exam today - Observation - Call clinic for new or changing moles - Recommend daily use of broad spectrum spf 30+ sunscreen to sun-exposed areas.   Hemangiomas - Red papules - Discussed benign nature - Observe - Call for any changes  Actinic Damage - diffuse scaly erythematous macules  with underlying dyspigmentation - Recommend daily broad spectrum sunscreen SPF 30+ to sun-exposed areas, reapply every 2 hours as needed.  - Call for new or changing lesions.  History of Basal Cell Carcinoma of the Skin - No evidence of recurrence today - Recommend regular full body skin exams - Recommend daily broad spectrum sunscreen SPF  30+ to sun-exposed areas, reapply every 2 hours as needed.  - Call if any new or changing lesions are noted between office visits  Purpura - Violaceous macules and patches - Benign - Related to age, sun damage and/or use of blood thinners - Observe - Can use OTC arnica containing moisturizer such as Dermend Bruise Formula if desired - Call for worsening or other concerns  Skin cancer screening performed today.  Return in about 1 year (around 04/30/2021) for TBSE.  Luther Redo, CMA, am acting as scribe for Sarina Ser, MD .  Documentation: I have reviewed the above documentation for accuracy and completeness, and I agree with the above.  Sarina Ser, MD

## 2020-05-03 ENCOUNTER — Encounter: Payer: Self-pay | Admitting: Dermatology

## 2020-05-03 NOTE — Progress Notes (Signed)
[facility]  [phone] [phone]  ID: Mason [name] OB: 09-Oct-1945  MR#: [id number]  WUX#:[id number]  Patient Care Team: Joaquim Nam, MD as PCP - General (Family Medicine) Deirdre Evener, MD as Consulting Physician (Dermatology) Pinnix-Bailey, Dinah Beers, MD as Consulting Physician (Dentistry) Judith Blonder. Starla Link, OD as Consulting Physician (Optometry) Drema Dallas, DO as Consulting Physician (Neurology)  I connected with Ginette Otto on 05/08/20 at  2:45 PM EDT by video enabled telemedicine visit and verified that I am speaking with the correct person using two identifiers.   I discussed the limitations, risks, security and privacy concerns of performing an evaluation and management service by telemedicine and the availability of in-person appointments. I also discussed with the patient that there may be a patient responsible charge related to this service. The patient expressed understanding and agreed to proceed.   Other persons participating in the visit and their role in the encounter: Patient, MD.  Patient's location: Home. Provider's location: Clinic.  CHIEF COMPLAINT: Portal vein thrombosis.  INTERVAL HISTORY: Patient agreed to video enabled telemedicine visit for further evaluation and discussion of his laboratory results.  He currently feels well and is asymptomatic.  Although initial plan was to discontinue Eliquis, after evaluation by his cardiologist he was instructed to continue treatment secondary to possible intermittent A. fib.  He has no neurologic complaints.  He denies any recent fevers.  He has a good appetite and denies weight loss.  He has no chest pain, shortness of breath, cough, or hemoptysis.  He denies any abdominal pain.  He denies any nausea, vomiting, constipation, or diarrhea.  He has no urinary complaints.  Patient offers no specific complaints today.  REVIEW OF SYSTEMS:   Review of Systems    Constitutional: Negative.  Negative for fever, malaise/fatigue and weight loss.  Respiratory: Negative.  Negative for cough, hemoptysis and shortness of breath.   Cardiovascular: Negative.  Negative for chest pain and leg swelling.  Gastrointestinal: Negative.  Negative for abdominal pain, blood in stool, diarrhea, melena and vomiting.  Genitourinary: Negative.  Negative for dysuria.  Musculoskeletal: Negative.  Negative for back pain.  Skin: Negative.  Negative for rash.  Neurological: Negative.  Negative for dizziness, focal weakness, weakness and headaches.  Psychiatric/Behavioral: Negative.  The patient is not nervous/anxious.     As per HPI. Otherwise, a complete review of systems is negative.  PAST MEDICAL HISTORY: Past Medical History:  Diagnosis Date  . Actinic keratosis 04/02/2013   L sup scapula - bx proven  . Actinic keratosis 04/02/2013   R med ant deltoid - bx proven  . Allergy   . Anal fissure   . Anxiety   . Basal cell carcinoma    back - treated in the past   . Cancer (HCC)    basal cell removed x4  . Cataract    bil eyes  . Depression   . Diverticulosis of colon   . GERD (gastroesophageal reflux disease)    esophageal narrowing  . Glaucoma   . Hiatal hernia   . IBS (irritable bowel syndrome)   . Insomnia   . Internal hemorrhoids     PAST SURGICAL HISTORY: Past Surgical History:  Procedure Laterality Date  . ANAL SPHINCTEROTOMY  03/1995   fistulotomy  . APPENDECTOMY    . BASAL CELL CARCINOMA EXCISION     on back  . BICEPT TENODESIS Right 09/27/2004   decompression  . COLONOSCOPY  04/1999   internal  hemmroids,12-11-08 colon tics and hems only   . ESOPHAGOGASTRODUODENOSCOPY  04/1999   with esophagitis and stricture  . MOHS SURGERY  09/02/2008   L supratip of nose with flap,basal cell cancer [facility]/MOHS  . TONGUE SURGERY  2017   had small place removed suspected of cancer  . TONSILLECTOMY AND ADENOIDECTOMY     remote  . UPPER GASTROINTESTINAL  ENDOSCOPY    . WRIST SURGERY Right    13 years ago  . WRIST SURGERY     scar tissue removed    FAMILY HISTORY: Family History  Problem Relation Age of Onset  . Hypertension Mother   . Stroke Mother   . Pneumonia Father   . Bladder Cancer Father   . Cancer Maternal Aunt        ? type  . Breast cancer Maternal Aunt        mets  . Esophageal cancer Maternal Aunt        aunt ? esophageal ca  . Stomach cancer Maternal Aunt        aunt ? stmach ca  . Colon cancer Maternal Aunt        had colostomy - not sure of origin  . Cancer Maternal Grandmother        ? type  . Prostate cancer Neg Hx   . Rectal cancer Neg Hx     ADVANCED DIRECTIVES (Y/N):  N  HEALTH MAINTENANCE: Social History   Tobacco Use  . Smoking status: Former Smoker    Types: Cigarettes, Cigars    Quit date: 08/22/1998    Years since quitting: 21.7  . Smokeless tobacco: Never Used  . Tobacco comment: cigarettes 1968, cigars 2000  Vaping Use  . Vaping Use: Never used  Substance Use Topics  . Alcohol use: Yes    Alcohol/week: 0.0 standard drinks    Comment: rare  . Drug use: No     Colonoscopy:  PAP:  Bone density:  Lipid panel:  Allergies  Allergen Reactions  . Chlordiazepoxide-Clidinium     REACTION: urinary retention  . Citalopram Hydrobromide     Intolerant, tried 03/2011.  "It made me very angry".  . Erythromycin Base     REACTION: stomach upset  . Hyoscyamine Sulfate     REACTION: urinary retention  . Meloxicam     GI upset  . Penicillins     REACTION: Itching, rash  . Shrimp [Shellfish Allergy]     And crab- rash, GI upset, upper airway symptoms    Current Outpatient Medications  Medication Sig Dispense Refill  . apixaban (ELIQUIS) 5 MG TABS tablet Take 1 tablet (5 mg total) by mouth 2 (two) times daily. 60 tablet 5  . b complex vitamins capsule Take 1 capsule by mouth daily.    . Bimatoprost (LUMIGAN) 0.01 % SOLN Apply 1 drop to eye daily.      . cholecalciferol (VITAMIN D) 1000  UNITS tablet Take 4,000 Units by mouth daily.     Marland Kitchen esomeprazole (NEXIUM) 40 MG capsule TAKE 1 CAPSULE BY MOUTH TWICE EVERY DAY 60 capsule 0  . fluticasone (FLONASE) 50 MCG/ACT nasal spray Place 2 sprays into both nostrils daily.    Marland Kitchen levocetirizine (XYZAL) 5 MG tablet TAKE 1 TABLET BY MOUTH EVERY DAY IN THE EVENING 30 tablet 12  . metoprolol succinate (TOPROL XL) 25 MG 24 hr tablet Take 0.5 tablets (12.5 mg total) by mouth daily. Please keep upcoming scheduled appointment. 30 tablet 1  . tamsulosin (FLOMAX) 0.4 MG  CAPS capsule TAKE 1-2 CAPSULES (0.4- 0.8 MG TOTAL) BY MOUTH DAILY. 60 capsule 12  . timolol (TIMOPTIC-XR) 0.5 % ophthalmic gel-forming Place 1 drop into the left eye every morning.    . vitamin C (ASCORBIC ACID) 500 MG tablet Take 500 mg by mouth daily.     No current facility-administered medications for this visit.    OBJECTIVE: There were no vitals filed for this visit.   There is no height or weight on file to calculate BMI.    ECOG FS:0 - Asymptomatic  General: Well-developed, well-nourished, no acute distress. HEENT: Normocephalic. Neuro: Alert, answering all questions appropriately. Cranial nerves grossly intact. Psych: Normal affect.  LAB RESULTS:  Lab Results  Component Value Date   NA 136 10/17/2019   K 4.3 10/17/2019   CL 99 10/17/2019   CO2 34 (H) 10/17/2019   GLUCOSE 115 (H) 10/17/2019   BUN 9 10/17/2019   CREATININE 0.95 10/17/2019   CALCIUM 9.0 10/17/2019   PROT 6.8 10/17/2019   ALBUMIN 3.6 10/17/2019   AST 27 10/17/2019   ALT 30 10/17/2019   ALKPHOS 67 10/17/2019   BILITOT 0.4 10/17/2019   GFRNONAA >60 10/04/2019   GFRAA >60 10/04/2019    Lab Results  Component Value Date   WBC 9.0 10/17/2019   NEUTROABS 5.5 10/17/2019   HGB 14.3 10/17/2019   HCT 42.6 10/17/2019   MCV 89.3 10/17/2019   PLT 330.0 10/17/2019     STUDIES: No results found.  ASSESSMENT: Portal vein thrombosis  PLAN:    1. Portal vein thrombosis: Unclear etiology.   Patient has no obvious evidence of malignancy.  He initially had a mildly elevated anticardiolipin IgM, but this is now trended down is essentially within normal limits.  The remainder of his hypercoagulable work-up was either negative or within normal limits.  Patient no longer needs Eliquis from a hematology standpoint, but will continue treatment under the direction of his cardiologist.  No further intervention is needed.  No further follow-up is necessary.  Please refer patient back if there are any questions or concerns.    I provided 20 minutes of face-to-face video visit time during this encounter which included chart review, counseling, and coordination of care as documented above.    Patient expressed understanding and was in agreement with this plan. He also understands that He can call clinic at any time with any questions, concerns, or complaints.    Jeralyn Ruths, MD   05/08/2020 6:41 AM

## 2020-05-07 ENCOUNTER — Other Ambulatory Visit: Payer: Self-pay

## 2020-05-07 ENCOUNTER — Inpatient Hospital Stay (HOSPITAL_BASED_OUTPATIENT_CLINIC_OR_DEPARTMENT_OTHER): Payer: Medicare Other | Admitting: Oncology

## 2020-05-07 DIAGNOSIS — I81 Portal vein thrombosis: Secondary | ICD-10-CM | POA: Diagnosis not present

## 2020-05-07 NOTE — Progress Notes (Signed)
Patient would like to know more about lab work that was performed over 3 weeks. Patient also would like to know how will he know blood clot is gone. Denies any pain or other concerns at this time.

## 2020-05-08 ENCOUNTER — Other Ambulatory Visit: Payer: Self-pay

## 2020-05-08 ENCOUNTER — Telehealth: Payer: Self-pay

## 2020-05-08 ENCOUNTER — Emergency Department
Admission: EM | Admit: 2020-05-08 | Discharge: 2020-05-08 | Disposition: A | Discharge status: Home / Self Care | Payer: Medicare Other | Attending: Emergency Medicine | Admitting: Emergency Medicine

## 2020-05-08 DIAGNOSIS — Z79899 Other long term (current) drug therapy: Secondary | ICD-10-CM | POA: Insufficient documentation

## 2020-05-08 DIAGNOSIS — R319 Hematuria, unspecified: Secondary | ICD-10-CM | POA: Diagnosis present

## 2020-05-08 DIAGNOSIS — Z7901 Long term (current) use of anticoagulants: Secondary | ICD-10-CM | POA: Insufficient documentation

## 2020-05-08 DIAGNOSIS — Z87891 Personal history of nicotine dependence: Secondary | ICD-10-CM | POA: Insufficient documentation

## 2020-05-08 DIAGNOSIS — Z85828 Personal history of other malignant neoplasm of skin: Secondary | ICD-10-CM | POA: Diagnosis not present

## 2020-05-08 DIAGNOSIS — R310 Gross hematuria: Secondary | ICD-10-CM | POA: Diagnosis not present

## 2020-05-08 LAB — URINALYSIS, COMPLETE (UACMP) WITH MICROSCOPIC
Bacteria, UA: NONE SEEN
Bilirubin Urine: NEGATIVE
Glucose, UA: NEGATIVE mg/dL
Hgb urine dipstick: NEGATIVE
Ketones, ur: NEGATIVE mg/dL
Leukocytes,Ua: NEGATIVE
Nitrite: NEGATIVE
Protein, ur: NEGATIVE mg/dL
Specific Gravity, Urine: 1.01 (ref 1.005–1.030)
Squamous Epithelial / HPF: NONE SEEN (ref 0–5)
pH: 6 (ref 5.0–8.0)

## 2020-05-08 LAB — BASIC METABOLIC PANEL
Anion gap: 7 (ref 5–15)
BUN: 13 mg/dL (ref 8–23)
CO2: 30 mmol/L (ref 22–32)
Calcium: 9 mg/dL (ref 8.9–10.3)
Chloride: 99 mmol/L (ref 98–111)
Creatinine, Ser: 1.03 mg/dL (ref 0.61–1.24)
GFR calc Af Amer: >60 mL/min (ref >60)
GFR calc non Af Amer: >60 mL/min (ref >60)
Glucose, Bld: 106 mg/dL — ABNORMAL HIGH (ref 70–99)
Potassium: 4.5 mmol/L (ref 3.5–5.1)
Sodium: 136 mmol/L (ref 135–145)

## 2020-05-08 LAB — CBC
HCT: 43.9 % (ref 39.0–52.0)
Hemoglobin: 15.3 g/dL (ref 13.0–17.0)
MCH: 31.7 pg (ref 26.0–34.0)
MCHC: 34.9 g/dL (ref 30.0–36.0)
MCV: 91.1 fL (ref 80.0–100.0)
Platelets: 392 10*3/uL (ref 150–400)
RBC: 4.82 MIL/uL (ref 4.22–5.81)
RDW: 15 % (ref 11.5–15.5)
WBC: 10.9 10*3/uL — ABNORMAL HIGH (ref 4.0–10.5)
nRBC: 0 % (ref 0.0–0.2)

## 2020-05-08 NOTE — ED Triage Notes (Signed)
Pt here with hematuria after using the bathroom this morning and abd pain. NAD in triage.

## 2020-05-08 NOTE — Telephone Encounter (Signed)
Unable to reach pt by phone on either contact # and per DPR to speak with pt only. FYI to [facility] CMA and Dr Damita Dunnings.

## 2020-05-08 NOTE — Telephone Encounter (Signed)
Please try to check on patient to see what details you can get.  Thanks.

## 2020-05-08 NOTE — Telephone Encounter (Signed)
[place] [facility] - Client TELEPHONE ADVICE RECORD [facility] Patient Name: Mason [name] Gender: Male DOB: 21-Mar-1946 Age: 74 Y 34 M 18 D Return Phone Number: [phone] (Primary) Address: City/State/Zip: [place] [address] Client [facility] - Client Client Site [place] - Day Contact Type Call Who Is Calling Patient / Member / Family / Caregiver Call Type Triage / Clinical Relationship To Patient Self Return Phone Number [phone] (Primary) Chief Complaint Urine, Blood In Reason for Call Symptomatic / Request for Health Information Initial Comment caller states urinating blood - bloody discharge - caller see's Dr. Elsie Stain GOTO Facility Not Listed [facility] Translation No Nurse Assessment Nurse: Vallery Sa, RN, [place] Date/Time (Eastern Time): 05/08/2020 9:56:57 AM Confirm and document reason for call. If symptomatic, describe symptoms. ---Mason [name] states that he developed blood in his urine this morning. No pain. No injury. No fever. Alert and responsive. Has the patient had close contact with a person known or suspected to have the novel coronavirus illness OR traveled / lives in area with major community spread (including international travel) in the last 14 days from the onset of symptoms? * If Asymptomatic, screen for exposure and travel within the last 14 days. ---No Does the patient have any new or worsening symptoms? ---Yes Will a triage be completed? ---Yes Related visit to physician within the last 2 weeks? ---No Does the PT have any chronic conditions? (i.e. diabetes, asthma, this includes High risk factors for pregnancy, etc.) ---Yes List chronic conditions. ---Enlarged Prostate, Diverticulitis, A-fib Is this a behavioral health or substance abuse call? ---No Guidelines Guideline Title Affirmed Question Affirmed Notes Nurse Date/Time (Eastern Time) Urine - Blood In Passing  pure blood or large blood clots (i.e., size > a dime) (Exception: fleck or small strands) [place], RN, [facility] 05/08/2020 9:58:48 AM Disp. Time Eilene Ghazi Time) Disposition Final User 05/08/2020 10:02:56 AM Go to ED Now Yes Vallery Sa, RN, [place] PLEASE NOTE: All timestamps contained within this report are represented as [place] Standard Time. CONFIDENTIALTY NOTICE: This fax transmission is intended only for the addressee. It contains information that is legally privileged, confidential or otherwise protected from use or disclosure. If you are not the intended recipient, you are strictly prohibited from reviewing, disclosing, copying using or disseminating any of this information or taking any action in reliance on or regarding this information. If you have received this fax in error, please notify us immediately by telephone so that we can arrange for its return to [place]. Phone: [phone], Toll-Free: [phone], Fax: [phone] Page: 2 of 2 Call Id: [id number] [place] Disagree/Comply Comply Caller Understands Yes PreDisposition Call Doctor Care Advice Given Per Guideline GO TO ED NOW: * You need to be seen in the Emergency Department. * Go to the ED at ___________ [place] now. Drive carefully. ANOTHER ADULT SHOULD DRIVE: * It is better and safer if another adult drives instead of you. BRING MEDICINES: * Bring a list of your current medicines when you go to the Emergency Department (ER). * Bring the pill bottles too. This will help the doctor (or NP/PA) to make certain you are taking the right medicines and the right dose. CARE ADVICE given per Urine, Blood In (Adult) guideline. Referrals GO TO FACILITY OTHER - SPECIFY

## 2020-05-08 NOTE — ED Provider Notes (Signed)
[facility] Emergency Department Provider Note   ____________________________________________    I have reviewed the triage vital signs and the nursing notes.   HISTORY  Chief Complaint Hematuria     HPI Mason [name] is a 74 y.o. male with a history as noted below who presents with complaints of hematuria.  Patient reports that he got up to urinate last night, while urinating had sharp pain in his penis followed by 2 drops of blood.  He is on Eliquis, did not take it this morning.  Has not had any further pain with urination but did have small drop of blood again which seems to have improved now.  No abdominal pain, no flank pain, no history of kidney stones.  No nausea or vomiting.  Reports normal urination now still occasional small drop of blood after urination  Past Medical History:  Diagnosis Date  . Actinic keratosis 04/02/2013   L sup scapula - bx proven  . Actinic keratosis 04/02/2013   R med ant deltoid - bx proven  . Allergy   . Anal fissure   . Anxiety   . Basal cell carcinoma    back - treated in the past   . Cancer ([place])    basal cell removed x4  . Cataract    bil eyes  . Depression   . Diverticulosis of colon   . GERD (gastroesophageal reflux disease)    esophageal narrowing  . Glaucoma   . Hiatal hernia   . IBS (irritable bowel syndrome)   . Insomnia   . Internal hemorrhoids     Patient Active Problem List   Diagnosis Date Noted  . Cough 10/20/2019  . Lung nodule 09/29/2019  . Atrial fibrillation ([place]) 09/29/2019  . Fever 09/19/2019  . Diverticulitis 09/18/2019  . 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/18/2019  . Sepsis ([place]) 09/18/2019  . Abnormal liver function 09/18/2019  . 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 Degenerative disc disease, cervical 01/23/2019  . Skin irritation 10/28/2018  . Neuropathic pain 08/08/2018  . Tongue lesion 07/01/2018  . Mild concussion 05/23/2018  . Health care maintenance 06/25/2017  .  Trochanteric bursitis 06/25/2017  . Low HDL (under 40) 05/24/2016  . Advance care planning 05/02/2014  . PSA elevation 05/02/2014  . BPH (benign prostatic hyperplasia) 12/10/2013  . Erectile dysfunction 08/26/2013  . GERD (gastroesophageal reflux disease) 06/28/2013  . Knee pain, left anterior 04/29/2013  . Medicare annual wellness visit, subsequent 11/04/2011  . Depression 04/01/2011  . Insomnia 12/16/2010  . ANXIETY 12/15/2007  . PEPTIC STRICTURE 12/15/2007  . HIATAL HERNIA 12/15/2007  . DIVERTICULOSIS, COLON W/O HEM 12/23/2006  . IRRITABLE BOWEL SYNDROME 12/23/2006    Past Surgical History:  Procedure Laterality Date  . ANAL SPHINCTEROTOMY  03/1995   fistulotomy  . APPENDECTOMY    . BASAL CELL CARCINOMA EXCISION     on back  . BICEPT TENODESIS Right 09/27/2004   decompression  . COLONOSCOPY  04/1999   internal hemmroids,12-11-08 colon tics and hems only   . ESOPHAGOGASTRODUODENOSCOPY  04/1999   with esophagitis and stricture  . MOHS SURGERY  09/02/2008   L supratip of nose with flap,basal cell cancer [facility]/MOHS  . TONGUE SURGERY  2017   had small place removed suspected of cancer  . TONSILLECTOMY AND ADENOIDECTOMY     remote  . UPPER GASTROINTESTINAL ENDOSCOPY    . WRIST SURGERY Right    13 years ago  . WRIST SURGERY     scar tissue removed  Prior to Admission medications   Medication Sig Start Date End Date Taking? Authorizing Provider  apixaban (ELIQUIS) 5 MG TABS tablet Take 1 tablet (5 mg total) by mouth 2 (two) times daily. 03/30/20   Kate Sable, MD  b complex vitamins capsule Take 1 capsule by mouth daily.    [provider]  Bimatoprost (LUMIGAN) 0.01 % SOLN Apply 1 drop to eye daily.      [provider]  cholecalciferol (VITAMIN D) 1000 UNITS tablet Take 4,000 Units by mouth daily.     [provider]  esomeprazole (NEXIUM) 40 MG capsule TAKE 1 CAPSULE BY MOUTH TWICE EVERY DAY 11/12/19   Tonia Ghent, MD  fluticasone  [facility]) 50 MCG/ACT nasal spray Place 2 sprays into both nostrils daily. 06/28/18   Tonia Ghent, MD  levocetirizine (XYZAL) 5 MG tablet TAKE 1 TABLET BY MOUTH EVERY DAY IN THE EVENING 03/30/20   Tonia Ghent, MD  metoprolol succinate (TOPROL XL) 25 MG 24 hr tablet Take 0.5 tablets (12.5 mg total) by mouth daily. Please keep upcoming scheduled appointment. 03/30/20   Kate Sable, MD  tamsulosin (FLOMAX) 0.4 MG CAPS capsule TAKE 1-2 CAPSULES (0.4- 0.8 MG TOTAL) BY MOUTH DAILY. 07/01/19   Tonia Ghent, MD  timolol (TIMOPTIC-XR) 0.5 % ophthalmic gel-forming Place 1 drop into the left eye every morning. 07/15/19   [provider]  vitamin C (ASCORBIC ACID) 500 MG tablet Take 500 mg by mouth daily.    [provider]  traZODone (DESYREL) 50 MG tablet 0.5-1 tab at night for insomnia. 12/16/10 11/03/11  Tonia Ghent, MD     Allergies Chlordiazepoxide-clidinium, Citalopram hydrobromide, Erythromycin base, Hyoscyamine sulfate, Meloxicam, Penicillins, and Shrimp [shellfish allergy]  Family History  Problem Relation Age of Onset  . Hypertension Mother   . Stroke Mother   . Pneumonia Father   . Bladder Cancer Father   . Cancer Maternal Aunt        ? type  . Breast cancer Maternal Aunt        mets  . Esophageal cancer Maternal Aunt        aunt ? esophageal ca  . Stomach cancer Maternal Aunt        aunt ? stmach ca  . Colon cancer Maternal Aunt        had colostomy - not sure of origin  . Cancer Maternal Grandmother        ? type  . Prostate cancer Neg Hx   . Rectal cancer Neg Hx     Social History Social History   Tobacco Use  . Smoking status: Former Smoker    Types: Cigarettes, Cigars    Quit date: 08/22/1998    Years since quitting: 21.7  . Smokeless tobacco: Never Used  . Tobacco comment: cigarettes 1968, cigars 2000  Vaping Use  . Vaping Use: Never used  Substance Use Topics  . Alcohol use: Yes    Alcohol/week: 0.0 standard drinks    Comment:  rare  . Drug use: No    Review of Systems  Constitutional: No fever/chills Eyes: No visual changes.  ENT: No sore throat. Cardiovascular: Denies chest pain. Respiratory: Denies shortness of breath. Gastrointestinal: No abdominal pain.   Genitourinary: As above Musculoskeletal: Negative for back pain. Skin: Negative for rash. Neurological: Negative for headaches    ____________________________________________   PHYSICAL EXAM:  VITAL SIGNS: ED Triage Vitals  Enc Vitals Group     BP 05/08/20 1209 (!) 157/103  Pulse Rate 05/08/20 1209 64     Resp 05/08/20 1209 18     Temp 05/08/20 1209 98.3 F (36.8 C)     Temp Source 05/08/20 1209 Oral     SpO2 05/08/20 1209 98 %     Weight 05/08/20 1210 79.4 kg (175 lb)     Height 05/08/20 1210 1.778 m (5\' 10" )     Head Circumference --      Peak Flow --      Pain Score 05/08/20 1210 0     Pain Loc --      Pain Edu? --      Excl. in [place]? --     Constitutional: Alert and oriented. No acute distress. Pleasant and interactive  Nose: No congestion/rhinnorhea. Mouth/Throat: Mucous membranes are moist.    Cardiovascular: Normal rate, regular rhythm. Grossly normal heart sounds.  Good peripheral circulation. Respiratory: Normal respiratory effort.  No retractions. Gastrointestinal: Soft and nontender. No distention.   Genitourinary: No penile discharge or bleeding, no tenderness palpation Musculoskeletal: .  Warm and well perfused Neurologic:  Normal speech and language. No gross focal neurologic deficits are appreciated.  Skin:  Skin is warm, dry and intact. No rash noted. Psychiatric: Mood and affect are normal. Speech and behavior are normal.  ____________________________________________   LABS (all labs ordered are listed, but only abnormal results are displayed)  Labs Reviewed  URINALYSIS, COMPLETE (UACMP) WITH MICROSCOPIC - Abnormal; Notable for the following components:      Result Value   Color, Urine YELLOW (*)     APPearance CLOUDY (*)    All other components within normal limits  BASIC METABOLIC PANEL - Abnormal; Notable for the following components:   Glucose, Bld 106 (*)    All other components within normal limits  CBC - Abnormal; Notable for the following components:   WBC 10.9 (*)    All other components within normal limits   ____________________________________________  EKG  None ____________________________________________  RADIOLOGY  None ____________________________________________   PROCEDURES  Procedure(s) performed: No  Procedures   Critical Care performed: No ____________________________________________   INITIAL IMPRESSION / ASSESSMENT AND PLAN / ED COURSE  Pertinent labs & imaging results that were available during my care of the patient were reviewed by me and considered in my medical decision making (see chart for details).  Patient presents with complaints of penile pain during urination as described above, now resolved, still with occasional small mount of bleeding.  He held Eliquis this morning.  HPI is most consistent with passage of stone causing urethral abrasion/injury with small and of bleeding.  No flank pain to suggest kidney stone.  Lab work is unremarkable, no red blood cells on urinalysis.  Recommend restarting Eliquis tomorrow, supportive care over the weekend, if continued bleeding outpatient follow-up with urology    ____________________________________________   FINAL CLINICAL IMPRESSION(S) / ED DIAGNOSES  Final diagnoses:  Gross hematuria        Note:  This document was prepared using Dragon voice recognition software and may include unintentional dictation errors.   Lavonia Drafts, MD 05/08/20 [phone]

## 2020-05-08 NOTE — Telephone Encounter (Signed)
Noted. Thanks.  Glad he passed it.

## 2020-05-08 NOTE — Telephone Encounter (Signed)
Patient went to hospital as instructed and was told it likely was a kidney stone that passed.  Patient's urine was clear at that point and is clear now except for a drop of blood on his penis at the end of his stream.  No pain, no fever or other symptoms.  ER MD told him he might see a little blood for the next 3 or 4 days from the stone having cut the urethra when passing.

## 2020-06-28 ENCOUNTER — Other Ambulatory Visit: Payer: Self-pay | Admitting: Family Medicine

## 2020-06-28 DIAGNOSIS — E786 Lipoprotein deficiency: Secondary | ICD-10-CM

## 2020-06-28 DIAGNOSIS — I4891 Unspecified atrial fibrillation: Secondary | ICD-10-CM

## 2020-06-28 DIAGNOSIS — Z125 Encounter for screening for malignant neoplasm of prostate: Secondary | ICD-10-CM

## 2020-07-02 ENCOUNTER — Telehealth: Payer: Self-pay | Admitting: Cardiology

## 2020-07-02 MED ORDER — RIVAROXABAN 20 MG PO TABS: 20.0000 mg | ORAL_TABLET | Freq: Every day | ORAL | 3 refills | Status: DC

## 2020-07-02 NOTE — Telephone Encounter (Signed)
Patient calling States in January his insurance will no longer cover Eliquis Patient would prefer to stay on this medication if he can  Please call to discuss options

## 2020-07-02 NOTE — Telephone Encounter (Signed)
Spoke with patient and informed him that we could try Xarelto as suggested on the letter received from [facility]. I assured him that Dr. Garen Lah approved of the medication change and explained it was a very similar drug to Eliquis. Patient was agreeable to change. I sent in the new prescription and he stated that he would start it in another month when he runs out of Eliquis.

## 2020-07-07 ENCOUNTER — Ambulatory Visit (INDEPENDENT_AMBULATORY_CARE_PROVIDER_SITE_OTHER): Payer: Medicare Other

## 2020-07-07 ENCOUNTER — Other Ambulatory Visit (INDEPENDENT_AMBULATORY_CARE_PROVIDER_SITE_OTHER): Payer: Medicare Other

## 2020-07-07 ENCOUNTER — Other Ambulatory Visit: Payer: Self-pay

## 2020-07-07 DIAGNOSIS — E786 Lipoprotein deficiency: Secondary | ICD-10-CM

## 2020-07-07 DIAGNOSIS — Z0000 Encounter for general adult medical examination without abnormal findings: Secondary | ICD-10-CM | POA: Diagnosis not present

## 2020-07-07 DIAGNOSIS — Z125 Encounter for screening for malignant neoplasm of prostate: Secondary | ICD-10-CM | POA: Diagnosis not present

## 2020-07-07 DIAGNOSIS — Z23 Encounter for immunization: Secondary | ICD-10-CM | POA: Diagnosis not present

## 2020-07-07 DIAGNOSIS — I4891 Unspecified atrial fibrillation: Secondary | ICD-10-CM | POA: Diagnosis not present

## 2020-07-07 LAB — LIPID PANEL
Cholesterol: 119 mg/dL (ref 0–200)
HDL: 33.4 mg/dL — ABNORMAL LOW (ref >39.00)
LDL Cholesterol: 67 mg/dL (ref 0–99)
NonHDL: 85.27
Total CHOL/HDL Ratio: 4
Triglycerides: 89 mg/dL (ref 0.0–149.0)
VLDL: 17.8 mg/dL (ref 0.0–40.0)

## 2020-07-07 LAB — COMPREHENSIVE METABOLIC PANEL
ALT: 17 U/L (ref 0–53)
AST: 21 U/L (ref 0–37)
Albumin: 3.9 g/dL (ref 3.5–5.2)
Alkaline Phosphatase: 61 U/L (ref 39–117)
BUN: 15 mg/dL (ref 6–23)
CO2: 33 mEq/L — ABNORMAL HIGH (ref 19–32)
Calcium: 8.9 mg/dL (ref 8.4–10.5)
Chloride: 98 mEq/L (ref 96–112)
Creatinine, Ser: 1.08 mg/dL (ref 0.40–1.50)
GFR: 66.95 mL/min (ref >60.00)
Glucose, Bld: 85 mg/dL (ref 70–99)
Potassium: 4.9 mEq/L (ref 3.5–5.1)
Sodium: 136 mEq/L (ref 135–145)
Total Bilirubin: 0.6 mg/dL (ref 0.2–1.2)
Total Protein: 6.8 g/dL (ref 6.0–8.3)

## 2020-07-07 LAB — CBC WITH DIFFERENTIAL/PLATELET
Basophils Absolute: 0.1 10*3/uL (ref 0.0–0.1)
Basophils Relative: 0.9 % (ref 0.0–3.0)
Eosinophils Absolute: 0.2 10*3/uL (ref 0.0–0.7)
Eosinophils Relative: 2.3 % (ref 0.0–5.0)
HCT: 45.7 % (ref 39.0–52.0)
Hemoglobin: 15.4 g/dL (ref 13.0–17.0)
Lymphocytes Relative: 21.1 % (ref 12.0–46.0)
Lymphs Abs: 2.2 10*3/uL (ref 0.7–4.0)
MCHC: 33.7 g/dL (ref 30.0–36.0)
MCV: 92.4 fl (ref 78.0–100.0)
Monocytes Absolute: 0.7 10*3/uL (ref 0.1–1.0)
Monocytes Relative: 6.6 % (ref 3.0–12.0)
Neutro Abs: 7.2 10*3/uL (ref 1.4–7.7)
Neutrophils Relative %: 69.1 % (ref 43.0–77.0)
Platelets: 368 10*3/uL (ref 150.0–400.0)
RBC: 4.95 Mil/uL (ref 4.22–5.81)
RDW: 15.1 % (ref 11.5–15.5)
WBC: 10.4 10*3/uL (ref 4.0–10.5)

## 2020-07-07 LAB — PSA, MEDICARE: PSA: 3.75 ng/ml (ref 0.10–4.00)

## 2020-07-07 LAB — TSH: TSH: 1.55 u[IU]/mL (ref 0.35–4.50)

## 2020-07-07 NOTE — Progress Notes (Signed)
Subjective:   Mason [name] is a 74 y.o. male who presents for Medicare Annual/Subsequent preventive examination.  Review of Systems: N/A      I connected with the patient today by telephone and verified that I am speaking with the correct person using two identifiers. Location patient: home Location nurse: work Persons participating in the telephone visit: patient, nurse.   I discussed the limitations, risks, security and privacy concerns of performing an evaluation and management service by telephone and the availability of in person appointments. I also discussed with the patient that there may be a patient responsible charge related to this service. The patient expressed understanding and verbally consented to this telephonic visit.        Cardiac Risk Factors include: advanced age (>26men, >48 women);male gender     Objective:    Today's Vitals   There is no height or weight on file to calculate BMI.  Advanced Directives 07/07/2020 05/08/2020 10/04/2019 09/18/2019 09/18/2019 07/30/2019 08/03/2018  Does Patient Have a Medical Advance Directive? No No No No No No No  Would patient like information on creating a medical advance directive? No - Patient declined - No - Patient declined No - Patient declined No - Patient declined - No - Patient declined    Current Medications (verified) Outpatient Encounter Medications as of 07/07/2020  Medication Sig  . b complex vitamins capsule Take 1 capsule by mouth daily.  . Bimatoprost (LUMIGAN) 0.01 % SOLN Apply 1 drop to eye daily.    . cholecalciferol (VITAMIN D) 1000 UNITS tablet Take 4,000 Units by mouth daily.   Marland Kitchen esomeprazole (NEXIUM) 40 MG capsule TAKE 1 CAPSULE BY MOUTH TWICE EVERY DAY  . fluticasone (FLONASE) 50 MCG/ACT nasal spray Place 2 sprays into both nostrils daily.  Marland Kitchen levocetirizine (XYZAL) 5 MG tablet TAKE 1 TABLET BY MOUTH EVERY DAY IN THE EVENING  . metoprolol succinate (TOPROL XL) 25 MG 24 hr tablet Take 0.5 tablets  (12.5 mg total) by mouth daily. Please keep upcoming scheduled appointment.  . rivaroxaban (XARELTO) 20 MG TABS tablet Take 1 tablet (20 mg total) by mouth daily with supper.  . tamsulosin (FLOMAX) 0.4 MG CAPS capsule TAKE 1-2 CAPSULES (0.4- 0.8 MG TOTAL) BY MOUTH DAILY.  Marland Kitchen timolol (TIMOPTIC-XR) 0.5 % ophthalmic gel-forming Place 1 drop into the left eye every morning.  . vitamin C (ASCORBIC ACID) 500 MG tablet Take 500 mg by mouth daily.  . [DISCONTINUED] traZODone (DESYREL) 50 MG tablet 0.5-1 tab at night for insomnia.   No facility-administered encounter medications on file as of 07/07/2020.    Allergies (verified) Chlordiazepoxide-clidinium, Citalopram hydrobromide, Erythromycin base, Hyoscyamine sulfate, Meloxicam, Penicillins, and Shrimp [shellfish allergy]   History: Past Medical History:  Diagnosis Date  . Actinic keratosis 04/02/2013   L sup scapula - bx proven  . Actinic keratosis 04/02/2013   R med ant deltoid - bx proven  . Allergy   . Anal fissure   . Anxiety   . Basal cell carcinoma    back - treated in the past   . Cancer ([place])    basal cell removed x4  . Cataract    bil eyes  . Depression   . Diverticulosis of colon   . GERD (gastroesophageal reflux disease)    esophageal narrowing  . Glaucoma   . Hiatal hernia   . IBS (irritable bowel syndrome)   . Insomnia   . Internal hemorrhoids    Past Surgical History:  Procedure Laterality Date  .  ANAL SPHINCTEROTOMY  03/1995   fistulotomy  . APPENDECTOMY    . BASAL CELL CARCINOMA EXCISION     on back  . BICEPT TENODESIS Right 09/27/2004   decompression  . COLONOSCOPY  04/1999   internal hemmroids,12-11-08 colon tics and hems only   . ESOPHAGOGASTRODUODENOSCOPY  04/1999   with esophagitis and stricture  . MOHS SURGERY  09/02/2008   L supratip of nose with flap,basal cell cancer [facility]/MOHS  . MOUTH SURGERY  01/2020  . TONGUE SURGERY  2017   had small place removed suspected of cancer  . TONSILLECTOMY AND  ADENOIDECTOMY     remote  . UPPER GASTROINTESTINAL ENDOSCOPY    . WRIST SURGERY Right    13 years ago  . WRIST SURGERY     scar tissue removed   Family History  Problem Relation Age of Onset  . Hypertension Mother   . Stroke Mother   . Pneumonia Father   . Bladder Cancer Father   . Cancer Maternal Aunt        ? type  . Breast cancer Maternal Aunt        mets  . Esophageal cancer Maternal Aunt        aunt ? esophageal ca  . Stomach cancer Maternal Aunt        aunt ? stmach ca  . Colon cancer Maternal Aunt        had colostomy - not sure of origin  . Cancer Maternal Grandmother        ? type  . Prostate cancer Neg Hx   . Rectal cancer Neg Hx    Social History   Socioeconomic History  . Marital status: Divorced    Spouse name: Not on file  . Number of children: 1  . Years of education: Not on file  . Highest education level: Not on file  Occupational History  . Occupation: Air cabin crew, retired 1997    Employer: [employer]  Tobacco Use  . Smoking status: Former Smoker    Types: Cigarettes, Cigars    Quit date: 08/22/1998    Years since quitting: 21.8  . Smokeless tobacco: Never Used  . Tobacco comment: cigarettes 1968, cigars 2000  Vaping Use  . Vaping Use: Never used  Substance and Sexual Activity  . Alcohol use: Not Currently    Alcohol/week: 0.0 standard drinks    Comment: rare  . Drug use: No  . Sexual activity: Never  Other Topics Concern  . Not on file  Social History Narrative   Retired from [employer]   Prev divorced    1 son   [employer] '63-'67, no known agent orange exposure   Social Determinants of Radio broadcast assistant Strain: Low Risk   . Difficulty of Paying Living Expenses: Not hard at all  Food Insecurity: No Food Insecurity  . Worried About Charity fundraiser in the Last Year: Never true  . Ran Out of Food in the Last Year: Never true  Transportation Needs: No Transportation Needs  . Lack of Transportation  (Medical): No  . Lack of Transportation (Non-Medical): No  Physical Activity: Inactive  . Days of Exercise per Week: 0 days  . Minutes of Exercise per Session: 0 min  Stress: No Stress Concern Present  . Feeling of Stress : Not at all  Social Connections:   . Frequency of Communication with Friends and Family: Not on file  . Frequency of Social Gatherings with Friends  and Family: Not on file  . Attends Religious Services: Not on file  . Active Member of Clubs or Organizations: Not on file  . Attends Archivist Meetings: Not on file  . Marital Status: Not on file    Tobacco Counseling Counseling given: Not Answered Comment: cigarettes 1968, cigars 2000   Clinical Intake:  Pre-visit preparation completed: Yes  Pain : No/denies pain     Nutritional Risks: None Diabetes: No  How often do you need to have someone help you when you read instructions, pamphlets, or other written materials from your doctor or pharmacy?: 1 - Never What is the last grade level you completed in school?: some college  Diabetic: No Nutrition Risk Assessment:  Has the patient had any N/V/D within the last 2 months?  No  Does the patient have any non-healing wounds?  No  Has the patient had any unintentional weight loss or weight gain?  No   Diabetes:  Is the patient diabetic?  No  If diabetic, was a CBG obtained today?  N/A Did the patient bring in their glucometer from home?  N/A   Financial Strains and Diabetes Management:  Are you having any financial strains with the device, your supplies or your medication? N/A.  Does the patient want to be seen by Chronic Care Management for management of their diabetes?  N/A Would the patient like to be referred to a Nutritionist or for Diabetic Management?  N/A     Interpreter Needed?: No  Information entered by :: CJohnson, LPN   Activities of Daily Living In your present state of health, do you have any difficulty performing the  following activities: 07/07/2020 09/18/2019  Hearing? N N  Vision? N N  Difficulty concentrating or making decisions? N N  Walking or climbing stairs? N N  Dressing or bathing? N N  Doing errands, shopping? N N  Preparing Food and eating ? N -  Using the Toilet? N -  In the past six months, have you accidently leaked urine? N -  Do you have problems with loss of bowel control? N -  Managing your Medications? N -  Managing your Finances? N -  Housekeeping or managing your Housekeeping? N -  Some recent data might be hidden    Patient Care Team: Tonia Ghent, MD as PCP - General (Family Medicine) Ralene Bathe, MD as Consulting Physician (Dermatology) Pinnix-Bailey, Damaris Schooner, MD as Consulting Physician (Dentistry) Celine Ahr. West Bali, OD as Consulting Physician (Optometry) Pieter Partridge, DO as Consulting Physician (Neurology)  Indicate any recent Medical Services you may have received from other than [facility] providers in the past year (date may be approximate).     Assessment:   This is a routine wellness examination for Visente.  Hearing/Vision screen  Hearing Screening   125Hz  250Hz  500Hz  1000Hz  2000Hz  3000Hz  4000Hz  6000Hz  8000Hz   Right ear:           Left ear:           Vision Screening Comments: Patient gets annual eye exams   Dietary issues and exercise activities discussed: Current Exercise Habits: The patient does not participate in regular exercise at present, Exercise limited by: None identified  Goals    . Patient Stated     Starting 06/26/2018, I will continue to take medications as prescribed.     . Patient Stated     07/07/2020, I will maintain and continue medications as prescribed.  Depression Screen PHQ 2/9 Scores 07/07/2020 07/04/2019 06/26/2018 06/08/2017 05/24/2016 08/18/2015 05/13/2015  PHQ - 2 Score 0 0 0 0 0 0 0  PHQ- 9 Score 0 - 0 0 - - -    Fall Risk Fall Risk  07/07/2020 07/30/2019 07/04/2019 06/26/2018 06/08/2017  Falls in the past  year? 0 0 0 0 No  Number falls in past yr: 0 - - - -  Injury with Fall? 0 - - - -  Risk for fall due to : Medication side effect - - - -  Follow up Falls evaluation completed;Falls prevention discussed - - - -    Any stairs in or around the home? Yes  If so, are there any without handrails? No  Home free of loose throw rugs in walkways, pet beds, electrical cords, etc? Yes  Adequate lighting in your home to reduce risk of falls? Yes   ASSISTIVE DEVICES UTILIZED TO PREVENT FALLS:  Life alert? No  Use of a cane, walker or w/c? No  Grab bars in the bathroom? No  Shower chair or bench in shower? No  Elevated toilet seat or a handicapped toilet? No   TIMED UP AND GO:  Was the test performed? N/A, telephone visit.  Cognitive Function: MMSE - Mini Mental State Exam 07/07/2020 06/26/2018 06/08/2017 05/24/2016  Orientation to time 5 5 5 5   Orientation to Place 5 5 5 5   Registration 3 3 3 3   Attention/ Calculation 5 0 0 0  Recall 3 3 3 3   Language- name 2 objects - 0 0 0  Language- repeat 1 1 1 1   Language- follow 3 step command - 3 3 3   Language- read & follow direction - 0 0 0  Write a sentence - 0 0 0  Copy design - 0 0 0  Total score - 20 20 20   Mini Cog  Mini-Cog screen was completed. Maximum score is 22. A value of 0 denotes this part of the MMSE was not completed or the patient failed this part of the Mini-Cog screening.       Immunizations Immunization History  Administered Date(s) Administered  . Fluad Quad(high Dose 65+) 04/25/2019  . Influenza Split 05/17/2011, 05/31/2012  . Influenza, High Dose Seasonal PF 05/23/2018  . Influenza,inj,Quad PF,6+ Mos 04/29/2013, 05/01/2014, 05/13/2015, 05/24/2016  . Influenza-Unspecified 05/12/2017  . Pneumococcal Conjugate-13 08/28/2014  . Pneumococcal Polysaccharide-23 10/28/2010  . Td 07/22/2002, 11/03/2011  . Zoster Recombinat (Shingrix) 06/12/2019, 08/28/2019    TDAP status: Up to date Flu Vaccine status: due, will get at  upcoming physical  Pneumococcal vaccine status: Up to date Covid-19 vaccine status: Declined, Education has been provided regarding the importance of this vaccine but patient still declined. Advised may receive this vaccine at local pharmacy or Health Dept.or vaccine clinic. Aware to provide a copy of the vaccination record if obtained from local pharmacy or Health Dept. Verbalized acceptance and understanding.  Qualifies for Shingles Vaccine? Yes   Zostavax completed No   Shingrix Completed?: Yes  Screening Tests Health Maintenance  Topic Date Due  . INFLUENZA VACCINE  03/22/2020  . COVID-19 Vaccine (1) 07/23/2021 (Originally 08/20/1958)  . TETANUS/TDAP  11/02/2021  . COLONOSCOPY  11/04/2029  . Hepatitis C Screening  Completed  . PNA vac Low Risk Adult  Completed    Health Maintenance  Health Maintenance Due  Topic Date Due  . INFLUENZA VACCINE  03/22/2020    Colorectal cancer screening: Completed 11/05/2019. Repeat every 10 years  Lung Cancer Screening: (Low  Dose CT Chest recommended if Age 78-80 years, 30 pack-year currently smoking OR have quit w/in 15 years.) does not qualify.    Additional Screening:  Hepatitis C Screening: does qualify; Completed 09/19/2019  Vision Screening: Recommended annual ophthalmology exams for early detection of glaucoma and other disorders of the eye. Is the patient up to date with their annual eye exam?  Yes  Who is the provider or what is the name of the office in which the patient attends annual eye exams? [facility] If pt is not established with a provider, would they like to be referred to a provider to establish care? No .   Dental Screening: Recommended annual dental exams for proper oral hygiene  Community Resource Referral / Chronic Care Management: CRR required this visit?  No   CCM required this visit?  No      Plan:     I have personally reviewed and noted the following in the patient's chart:   . Medical and  social history . Use of alcohol, tobacco or illicit drugs  . Current medications and supplements . Functional ability and status . Nutritional status . Physical activity . Advanced directives . List of other physicians . Hospitalizations, surgeries, and ER visits in previous 12 months . Vitals . Screenings to include cognitive, depression, and falls . Referrals and appointments  In addition, I have reviewed and discussed with patient certain preventive protocols, quality metrics, and best practice recommendations. A written personalized care plan for preventive services as well as general preventive health recommendations were provided to patient.   Due to this being a telephonic visit, the after visit summary with patients personalized plan was offered to patient via office or my-chart. Patient preferred to pick up at office at next visit or via mychart.   Andrez Grime, LPN   10/93/2355

## 2020-07-07 NOTE — Progress Notes (Signed)
Patient received high dose flu vaccine in L Deltoid. Tolerated well.

## 2020-07-07 NOTE — Patient Instructions (Signed)
Mason [name] , Thank you for taking time to come for your Medicare Wellness Visit. I appreciate your ongoing commitment to your health goals. Please review the following plan we discussed and let me know if I can assist you in the future.   Screening recommendations/referrals: Colonoscopy: Up to date, completed 11/05/2019, due 10/2029 Recommended yearly ophthalmology/optometry visit for glaucoma screening and checkup Recommended yearly dental visit for hygiene and checkup  Vaccinations: Influenza vaccine: due, will get at upcoming physical Pneumococcal vaccine: Completed series Tdap vaccine: Up to date, completed 11/03/2011, due 10/2021 Shingles vaccine: Completed series   Covid-19: declined  Advanced directives: Advance directive discussed with you today. Even though you declined this today please call our office should you change your mind and we can give you the proper paperwork for you to fill out.    Conditions/risks identified: N/A  Next appointment: Follow up in one year for your annual wellness visit.   Preventive Care 36 Years and Older, Male Preventive care refers to lifestyle choices and visits with your health care provider that can promote health and wellness. What does preventive care include?  A yearly physical exam. This is also called an annual well check.  Dental exams once or twice a year.  Routine eye exams. Ask your health care provider how often you should have your eyes checked.  Personal lifestyle choices, including:  Daily care of your teeth and gums.  Regular physical activity.  Eating a healthy diet.  Avoiding tobacco and drug use.  Limiting alcohol use.  Practicing safe sex.  Taking low doses of aspirin every day.  Taking vitamin and mineral supplements as recommended by your health care provider. What happens during an annual well check? The services and screenings done by your health care provider during your annual well check will depend on  your age, overall health, lifestyle risk factors, and family history of disease. Counseling  Your health care provider may ask you questions about your:  Alcohol use.  Tobacco use.  Drug use.  Emotional well-being.  Home and relationship well-being.  Sexual activity.  Eating habits.  History of falls.  Memory and ability to understand (cognition).  Work and work Statistician. Screening  You may have the following tests or measurements:  Height, weight, and BMI.  Blood pressure.  Lipid and cholesterol levels. These may be checked every 5 years, or more frequently if you are over 33 years old.  Skin check.  Lung cancer screening. You may have this screening every year starting at age 29 if you have a 30-pack-year history of smoking and currently smoke or have quit within the past 15 years.  Fecal occult blood test (FOBT) of the stool. You may have this test every year starting at age 74.  Flexible sigmoidoscopy or colonoscopy. You may have a sigmoidoscopy every 5 years or a colonoscopy every 10 years starting at age 35.  Prostate cancer screening. Recommendations will vary depending on your family history and other risks.  Hepatitis C blood test.  Hepatitis B blood test.  Sexually transmitted disease (STD) testing.  Diabetes screening. This is done by checking your blood sugar (glucose) after you have not eaten for a while (fasting). You may have this done every 1-3 years.  Abdominal aortic aneurysm (AAA) screening. You may need this if you are a current or former smoker.  Osteoporosis. You may be screened starting at age 4 if you are at high risk. Talk with your health care provider about your test results,  treatment options, and if necessary, the need for more tests. Vaccines  Your health care provider may recommend certain vaccines, such as:  Influenza vaccine. This is recommended every year.  Tetanus, diphtheria, and acellular pertussis (Tdap, Td) vaccine.  You may need a Td booster every 10 years.  Zoster vaccine. You may need this after age 50.  Pneumococcal 13-valent conjugate (PCV13) vaccine. One dose is recommended after age 67.  Pneumococcal polysaccharide (PPSV23) vaccine. One dose is recommended after age 84. Talk to your health care provider about which screenings and vaccines you need and how often you need them. This information is not intended to replace advice given to you by your health care provider. Make sure you discuss any questions you have with your health care provider. Document Released: 09/04/2015 Document Revised: 04/27/2016 Document Reviewed: 06/09/2015 Elsevier Interactive Patient Education  2017 [employer] Prevention in the Home Falls can cause injuries. They can happen to people of all ages. There are many things you can do to make your home safe and to help prevent falls. What can I do on the outside of my home?  Regularly fix the edges of walkways and driveways and fix any cracks.  Remove anything that might make you trip as you walk through a door, such as a raised step or threshold.  Trim any bushes or trees on the path to your home.  Use bright outdoor lighting.  Clear any walking paths of anything that might make someone trip, such as rocks or tools.  Regularly check to see if handrails are loose or broken. Make sure that both sides of any steps have handrails.  Any raised decks and porches should have guardrails on the edges.  Have any leaves, snow, or ice cleared regularly.  Use sand or salt on walking paths during winter.  Clean up any spills in your garage right away. This includes oil or grease spills. What can I do in the bathroom?  Use night lights.  Install grab bars by the toilet and in the tub and shower. Do not use towel bars as grab bars.  Use non-skid mats or decals in the tub or shower.  If you need to sit down in the shower, use a plastic, non-slip stool.  Keep the  floor dry. Clean up any water that spills on the floor as soon as it happens.  Remove soap buildup in the tub or shower regularly.  Attach bath mats securely with double-sided non-slip rug tape.  Do not have throw rugs and other things on the floor that can make you trip. What can I do in the bedroom?  Use night lights.  Make sure that you have a light by your bed that is easy to reach.  Do not use any sheets or blankets that are too big for your bed. They should not hang down onto the floor.  Have a firm chair that has side arms. You can use this for support while you get dressed.  Do not have throw rugs and other things on the floor that can make you trip. What can I do in the kitchen?  Clean up any spills right away.  Avoid walking on wet floors.  Keep items that you use a lot in easy-to-reach places.  If you need to reach something above you, use a strong step stool that has a grab bar.  Keep electrical cords out of the way.  Do not use floor polish or wax that makes floors  slippery. If you must use wax, use non-skid floor wax.  Do not have throw rugs and other things on the floor that can make you trip. What can I do with my stairs?  Do not leave any items on the stairs.  Make sure that there are handrails on both sides of the stairs and use them. Fix handrails that are broken or loose. Make sure that handrails are as long as the stairways.  Check any carpeting to make sure that it is firmly attached to the stairs. Fix any carpet that is loose or worn.  Avoid having throw rugs at the top or bottom of the stairs. If you do have throw rugs, attach them to the floor with carpet tape.  Make sure that you have a light switch at the top of the stairs and the bottom of the stairs. If you do not have them, ask someone to add them for you. What else can I do to help prevent falls?  Wear shoes that:  Do not have high heels.  Have rubber bottoms.  Are comfortable and fit  you well.  Are closed at the toe. Do not wear sandals.  If you use a stepladder:  Make sure that it is fully opened. Do not climb a closed stepladder.  Make sure that both sides of the stepladder are locked into place.  Ask someone to hold it for you, if possible.  Clearly mark and make sure that you can see:  Any grab bars or handrails.  First and last steps.  Where the edge of each step is.  Use tools that help you move around (mobility aids) if they are needed. These include:  Canes.  Walkers.  Scooters.  Crutches.  Turn on the lights when you go into a dark area. Replace any light bulbs as soon as they burn out.  Set up your furniture so you have a clear path. Avoid moving your furniture around.  If any of your floors are uneven, fix them.  If there are any pets around you, be aware of where they are.  Review your medicines with your doctor. Some medicines can make you feel dizzy. This can increase your chance of falling. Ask your doctor what other things that you can do to help prevent falls. This information is not intended to replace advice given to you by your health care provider. Make sure you discuss any questions you have with your health care provider. Document Released: 06/04/2009 Document Revised: 01/14/2016 Document Reviewed: 09/12/2014 Elsevier Interactive Patient Education  2017 [employer].

## 2020-07-07 NOTE — Progress Notes (Signed)
PCP notes:  Health Maintenance: Flu- due COVID- declined   Abnormal Screenings: none   Patient concerns: Discuss having follow up test done on the spot on his lung    Nurse concerns: none   Next PCP appt.: 07/09/2020 @ 9:30 am

## 2020-07-09 ENCOUNTER — Ambulatory Visit (INDEPENDENT_AMBULATORY_CARE_PROVIDER_SITE_OTHER): Payer: Medicare Other | Admitting: Family Medicine

## 2020-07-09 ENCOUNTER — Other Ambulatory Visit: Payer: Self-pay

## 2020-07-09 ENCOUNTER — Encounter: Payer: Self-pay | Admitting: Family Medicine

## 2020-07-09 VITALS — BP 120/72 | HR 61 | Temp 98.1°F | Ht 69.0 in | Wt 170.6 lb

## 2020-07-09 DIAGNOSIS — I4891 Unspecified atrial fibrillation: Secondary | ICD-10-CM

## 2020-07-09 DIAGNOSIS — R911 Solitary pulmonary nodule: Secondary | ICD-10-CM

## 2020-07-09 DIAGNOSIS — Z0000 Encounter for general adult medical examination without abnormal findings: Secondary | ICD-10-CM

## 2020-07-09 DIAGNOSIS — Z7189 Other specified counseling: Secondary | ICD-10-CM

## 2020-07-09 NOTE — Progress Notes (Signed)
This visit occurred during the SARS-CoV-2 public health emergency.  Safety protocols were in place, including screening questions prior to the visit, additional usage of staff PPE, and extensive cleaning of exam room while observing appropriate contact time as indicated for disinfecting solutions.  Lung imaging d/w pt.  Prev 38mm lung nodule noted.  Former smoker.  Due for repeat lung CT in 1/22.     AF d/w pt.  Still on metoprolol.  Fatigue noted.  He had outpatient monitoring.  Still on anticoagulation.  No bleeding except for hematuria once.  D/w pt. we talked about urology f/u if persistent sx, likely passed a stone.  See avs.    He had non-cancerous tongue lesion removed.  I'll defer to outside clinic.  He is not having symptoms currently.  The biopsy site healed over the meantime. Tetanus 2013 PNA up to date.  Flu 2021 Shingles done  covid encouraged Colonoscopy 2021 Advance directive- d/w pt. Son designated if patient were incapacitated.  Prev AAA screen done (per patient) and negative.  PSA wnl 2021  Meds, vitals, and allergies reviewed.  ROS: Per HPI unless specifically indicated in ROS section   GEN: nad, alert and oriented HEENT: ncat NECK: supple w/o LA CV: rrr PULM: ctab, no inc wob ABD: soft, +bs EXT: no edema SKIN: no acute rash  At least 30 minutes were devoted to patient care in this encounter (this can potentially include time spent reviewing the patient's file/history, interviewing and examining the patient, counseling/reviewing plan with patient, ordering referrals, ordering tests, reviewing relevant laboratory or x-ray data, and documenting the encounter).

## 2020-07-09 NOTE — Patient Instructions (Addendum)
Please update cardiology.   It may be that totally stopping metoprolol would help with fatigue.  I would do that prior to stopping xarelto. Update me as needed.  Take care.  Glad to see you.

## 2020-07-12 NOTE — Assessment & Plan Note (Signed)
Prev 52mm lung nodule noted.  Former smoker.  Due for repeat lung CT in 1/22.   No symptoms currently.  Discussed plan for repeat imaging.

## 2020-07-12 NOTE — Assessment & Plan Note (Signed)
Still on metoprolol.  Fatigue noted.  He had outpatient monitoring.  Still on anticoagulation.  No bleeding except for hematuria once.  D/w pt. we talked about urology f/u if persistent sx, likely passed a stone.  See avs.   I asked him to please update cardiology.   It may be that totally stopping metoprolol would help with fatigue.  I would do that prior to stopping xarelto.  Rationale discussed with patient.

## 2020-07-12 NOTE — Assessment & Plan Note (Signed)
Advance directive- d/w pt. Son designated if patient were incapacitated.  ?

## 2020-07-12 NOTE — Assessment & Plan Note (Signed)
Tetanus 2013 PNA up to date.  Flu 2021 Shingles done  covid encouraged Colonoscopy 2021 Advance directive- d/w pt. Son designated if patient were incapacitated.  Prev AAA screen done (per patient) and negative.  PSA wnl 2021

## 2020-08-23 ENCOUNTER — Telehealth: Payer: Self-pay | Admitting: Family Medicine

## 2020-08-23 DIAGNOSIS — R911 Solitary pulmonary nodule: Secondary | ICD-10-CM

## 2020-08-23 NOTE — Telephone Encounter (Signed)
Due for follow-up chest CT.  This is a follow-up on a previously identified lung nodule.  I put in the follow-up order.  I wanted him to get a call from [place] before he got a call from scheduling, just so he would not be surprised.  Thanks.

## 2020-08-25 NOTE — Telephone Encounter (Signed)
Spoke with pt regarding F/U with Ct and he stated that he has not heard anything yet regarding getting him scheduled. He also stated that he just had cataract surgery and he is currently doing some F/U with that.  I will reach out to the appropriate person for referrals and have them F/U with him regarding this.  Thank you,  Christy Gentles

## 2020-08-26 ENCOUNTER — Other Ambulatory Visit: Payer: Self-pay | Admitting: Family Medicine

## 2020-08-26 NOTE — Telephone Encounter (Signed)
Noted. Thanks.
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
## 2020-09-04 ENCOUNTER — Encounter: Payer: Self-pay | Admitting: Family Medicine

## 2020-09-04 ENCOUNTER — Ambulatory Visit (INDEPENDENT_AMBULATORY_CARE_PROVIDER_SITE_OTHER): Payer: Medicare Other | Admitting: Family Medicine

## 2020-09-04 ENCOUNTER — Other Ambulatory Visit: Payer: Self-pay

## 2020-09-04 DIAGNOSIS — J3489 Other specified disorders of nose and nasal sinuses: Secondary | ICD-10-CM

## 2020-09-04 NOTE — Progress Notes (Signed)
This visit occurred during the SARS-CoV-2 public health emergency.  Safety protocols were in place, including screening questions prior to the visit, additional usage of staff PPE, and extensive cleaning of exam room while observing appropriate contact time as indicated for disinfecting solutions.  Nostril irritation, internal.  R sided.  No L sided lesion.  Going on for a few weeks.  Not better.  Crusting but not frank bleeding.  Hasn't tried anything on it yet.  No FCNAVD.  No recent flonase use.    Meds, vitals, and allergies reviewed.   ROS: Per HPI unless specifically indicated in ROS section   nad ncat OP wnl R distal nose ttp externally.  Irritation on the internal R nostril superiorly, no ulcer.   No bleeding. No left-sided irritation. Neck supple. No lymphadenopathy.

## 2020-09-04 NOTE — Patient Instructions (Signed)
Use a small amount of neosporin in the right nostril once a day.  Avoid heavy nose blowing.   If not better by next week, let me know so we can set up ENT appointment.  Take care.  Glad to see you.

## 2020-09-07 NOTE — Assessment & Plan Note (Signed)
This could be a completely benign issue but if it does not resolve we can refer him over to ENT. Discussed. I would use a small amount of neosporin in the right nostril once a day.  Avoid heavy nose blowing.   If not better by next week, he will let me know so we can set up ENT appointment.  He agrees with plan.

## 2020-09-21 ENCOUNTER — Other Ambulatory Visit: Payer: Self-pay | Admitting: Family Medicine

## 2020-09-22 ENCOUNTER — Other Ambulatory Visit: Payer: Self-pay

## 2020-09-22 ENCOUNTER — Ambulatory Visit (INDEPENDENT_AMBULATORY_CARE_PROVIDER_SITE_OTHER)
Admission: RE | Admit: 2020-09-22 | Discharge: 2020-09-22 | Disposition: A | Discharge status: Home / Self Care | Payer: Medicare Other | Source: Ambulatory Visit | Attending: Family Medicine | Admitting: Family Medicine

## 2020-09-22 DIAGNOSIS — R911 Solitary pulmonary nodule: Secondary | ICD-10-CM | POA: Diagnosis not present

## 2020-10-06 ENCOUNTER — Ambulatory Visit (INDEPENDENT_AMBULATORY_CARE_PROVIDER_SITE_OTHER): Payer: Medicare Other | Admitting: Cardiology

## 2020-10-06 ENCOUNTER — Other Ambulatory Visit: Payer: Self-pay

## 2020-10-06 ENCOUNTER — Encounter: Payer: Self-pay | Admitting: Cardiology

## 2020-10-06 VITALS — BP 132/82 | HR 52 | Ht 69.0 in | Wt 173.0 lb

## 2020-10-06 DIAGNOSIS — I480 Paroxysmal atrial fibrillation: Secondary | ICD-10-CM | POA: Diagnosis not present

## 2020-10-06 DIAGNOSIS — R030 Elevated blood-pressure reading, without diagnosis of hypertension: Secondary | ICD-10-CM | POA: Insufficient documentation

## 2020-10-06 DIAGNOSIS — I81 Portal vein thrombosis: Secondary | ICD-10-CM

## 2020-10-06 NOTE — Progress Notes (Signed)
Cardiology Office Note:    Date:  10/06/2020   ID:  Mason, [name] 08-26-1945, MRN [id number]  PCP:  Joaquim Nam, MD  Cardiologist:  No primary care provider on file.  Electrophysiologist:  None   Referring MD: Joaquim Nam, MD   Chief Complaint  Patient presents with  . Follow-up    6 months    History of Present Illness:    Mason [name] is a 75 y.o. male with a hx of paroxysmal atrial fibrillation on Eliquis, hypertension, portal venous thrombosis, anxiety who presents for follow-up for A. fib management.    Toprol-XL previously decreased to 12.5 mg daily.  Patient takes medications as prescribed.  Denies any palpitations, but he states having dizziness, and some presyncopal symptoms at times.  His blood pressures have been well controlled, heart rate in the office today was low.  He was switched from Eliquis to Xarelto due to insurance cost.  Prior notes Patient has history of abdominal discomfort, abdominal imaging with CT abdomen pelvis with contrast revealed acute diverticulitis and portal vein thrombosis.  He was started on antibiotics and Eliquis.  He was seen by GI and hematology.  Anticoagulation for 3 to 6 months was recommended.  Patient later on noticed to have intermittent tachycardia on telemetry.  EKG on 09/20/2019 showed atrial fibrillation with heart rate 109.  Subsequent ECGs showed sinus rhythm.  Echocardiogram obtained on 08/2019 showed low normal ejection fraction of 50 to 55%, left and right atrial sizes were normal.  Past Medical History:  Diagnosis Date  . Actinic keratosis 04/02/2013   L sup scapula - bx proven  . Actinic keratosis 04/02/2013   R med ant deltoid - bx proven  . Allergy   . Anal fissure   . Anxiety   . Basal cell carcinoma    back - treated in the past   . Cancer (HCC)    basal cell removed x4  . Cataract    bil eyes  . Depression   . Diverticulosis of colon   . GERD (gastroesophageal reflux disease)     esophageal narrowing  . Glaucoma   . Hiatal hernia   . IBS (irritable bowel syndrome)   . Insomnia   . Internal hemorrhoids     Past Surgical History:  Procedure Laterality Date  . ANAL SPHINCTEROTOMY  03/1995   fistulotomy  . APPENDECTOMY    . BASAL CELL CARCINOMA EXCISION     on back  . BICEPT TENODESIS Right 09/27/2004   decompression  . COLONOSCOPY  04/1999   internal hemmroids,12-11-08 colon tics and hems only   . ESOPHAGOGASTRODUODENOSCOPY  04/1999   with esophagitis and stricture  . MOHS SURGERY  09/02/2008   L supratip of nose with flap,basal cell cancer [facility]/MOHS  . MOUTH SURGERY  01/2020  . TONGUE SURGERY  2017   had small place removed suspected of cancer  . TONSILLECTOMY AND ADENOIDECTOMY     remote  . UPPER GASTROINTESTINAL ENDOSCOPY    . WRIST SURGERY Right    13 years ago  . WRIST SURGERY     scar tissue removed    Current Medications: Current Meds  Medication Sig  . b complex vitamins capsule Take 1 capsule by mouth daily.  . bimatoprost (LUMIGAN) 0.01 % SOLN Apply 1 drop to eye daily.  . cholecalciferol (VITAMIN D) 1000 UNITS tablet Take 4,000 Units by mouth daily.   Marland Kitchen esomeprazole (NEXIUM) 40 MG capsule TAKE 1 CAPSULE BY MOUTH TWICE  EVERY DAY  . levocetirizine (XYZAL) 5 MG tablet TAKE 1 TABLET BY MOUTH EVERY DAY IN THE EVENING  . rivaroxaban (XARELTO) 20 MG TABS tablet Take 1 tablet (20 mg total) by mouth daily with supper.  . tamsulosin (FLOMAX) 0.4 MG CAPS capsule TAKE 1-2 CAPSULES (0.4- 0.8 MG TOTAL) BY MOUTH DAILY.  Marland Kitchen timolol (TIMOPTIC-XR) 0.5 % ophthalmic gel-forming Place 1 drop into the left eye every morning.  . vitamin C (ASCORBIC ACID) 500 MG tablet Take 500 mg by mouth daily.  Marland Kitchen zinc gluconate 50 MG tablet Take 50 mg by mouth daily.  . [DISCONTINUED] metoprolol succinate (TOPROL XL) 25 MG 24 hr tablet Take 0.5 tablets (12.5 mg total) by mouth daily. Please keep upcoming scheduled appointment.     Allergies:   Chlordiazepoxide-clidinium,  Citalopram hydrobromide, Erythromycin base, Hyoscyamine sulfate, Meloxicam, Penicillins, and Shrimp [shellfish allergy]   Social History   Socioeconomic History  . Marital status: Divorced    Spouse name: Not on file  . Number of children: 1  . Years of education: Not on file  . Highest education level: Not on file  Occupational History  . Occupation: Art therapist, retired 1997    Employer: [employer]  Tobacco Use  . Smoking status: Former Smoker    Types: Cigarettes, Cigars    Quit date: 08/22/1998    Years since quitting: 22.1  . Smokeless tobacco: Never Used  . Tobacco comment: cigarettes 1968, cigars 2000  Vaping Use  . Vaping Use: Never used  Substance and Sexual Activity  . Alcohol use: Not Currently    Alcohol/week: 0.0 standard drinks    Comment: rare  . Drug use: No  . Sexual activity: Never  Other Topics Concern  . Not on file  Social History Narrative   Retired from [employer]   From [place]   Prev divorced    1 son   [place] '63-'67, no known agent orange exposure   Social Determinants of Corporate investment banker Strain: Low Risk   . Difficulty of Paying Living Expenses: Not hard at all  Food Insecurity: No Food Insecurity  . Worried About Programme researcher, broadcasting/film/video in the Last Year: Never true  . Ran Out of Food in the Last Year: Never true  Transportation Needs: No Transportation Needs  . Lack of Transportation (Medical): No  . Lack of Transportation (Non-Medical): No  Physical Activity: Inactive  . Days of Exercise per Week: 0 days  . Minutes of Exercise per Session: 0 min  Stress: No Stress Concern Present  . Feeling of Stress : Not at all  Social Connections: Not on file     Family History: The patient's family history includes Bladder Cancer in his father; Breast cancer in his maternal aunt; Cancer in his maternal aunt and maternal grandmother; Colon cancer in his maternal aunt; Esophageal cancer in his maternal aunt; Hypertension in his mother;  Pneumonia in his father; Stomach cancer in his maternal aunt; Stroke in his mother. There is no history of Prostate cancer or Rectal cancer.  ROS:   Please see the history of present illness.     All other systems reviewed and are negative.  EKGs/Labs/Other Studies Reviewed:    The following studies were reviewed today:   EKG:  EKG is  ordered today.  The ekg ordered today demonstrates sinus bradycardia  Recent Labs: 07/07/2020: ALT 17; BUN 15; Creatinine, Ser 1.08; Hemoglobin 15.4; Platelets 368.0; Potassium 4.9; Sodium 136; TSH 1.55  Recent Lipid Panel  Component Value Date/Time   CHOL 119 07/07/2020 0926   TRIG 89.0 07/07/2020 0926   HDL 33.40 (L) 07/07/2020 0926   CHOLHDL 4 07/07/2020 0926   VLDL 17.8 07/07/2020 0926   LDLCALC 67 07/07/2020 0926    Physical Exam:    VS:  BP 132/82   Pulse (!) 52   Ht 5\' 9"  (1.753 m)   Wt 173 lb (78.5 kg)   BMI 25.55 kg/m     Wt Readings from Last 3 Encounters:  10/06/20 173 lb (78.5 kg)  09/04/20 176 lb (79.8 kg)  07/09/20 170 lb 9 oz (77.4 kg)     GEN:  Well nourished, well developed in no acute distress HEENT: Normal NECK: No JVD; No carotid bruits LYMPHATICS: No lymphadenopathy CARDIAC: RRR, no murmurs, rubs, gallops RESPIRATORY:  Clear to auscultation without rales, wheezing or rhonchi  ABDOMEN: Soft, non-tender, non-distended MUSCULOSKELETAL:  No edema; No deformity  SKIN: Warm and dry NEUROLOGIC:  Alert and oriented x 3 PSYCHIATRIC:  Normal affect   ASSESSMENT:    1. Paroxysmal atrial fibrillation (HCC)   2. Portal vein thrombosis    PLAN:    In order of problems listed above:  1. Patient with history of paroxysmal atrial fibrillation.  Currently in sinus rhythm.  CHA2DS2-VASc score equals 2.  Prior echo showed normal EF, EF 50 to 55%.  Stop Toprol-XL due to dizzy spells.  Continue Xarelto..   2. History of portal vein thrombosis, malignancy work-up per hematology has been negative.  On Xarelto.  Follow-up  in 3 months.   Medication Adjustments/Labs and Tests Ordered: Current medicines are reviewed at length with the patient today.  Concerns regarding medicines are outlined above.  Orders Placed This Encounter  Procedures  . EKG 12-Lead   No orders of the defined types were placed in this encounter.   Patient Instructions  Medication Instructions:   Your physician has recommended you make the following change in your medication:   STOP taking your Toprol XL.    *If you need a refill on your cardiac medications before your next appointment, please call your pharmacy*   Lab Work: None ordered If you have labs (blood work) drawn today and your tests are completely normal, you will receive your results only by: Marland Kitchen MyChart Message (if you have MyChart) OR . A paper copy in the mail If you have any lab test that is abnormal or we need to change your treatment, we will call you to review the results.   Testing/Procedures: None ordered   Follow-Up: At [facility], you and your health needs are our priority.  As part of our continuing mission to provide you with exceptional heart care, we have created designated Provider Care Teams.  These Care Teams include your primary Cardiologist (physician) and Advanced Practice Providers (APPs -  Physician Assistants and Nurse Practitioners) who all work together to provide you with the care you need, when you need it.  We recommend signing up for the patient portal called "MyChart".  Sign up information is provided on this After Visit Summary.  MyChart is used to connect with patients for Virtual Visits (Telemedicine).  Patients are able to view lab/test results, encounter notes, upcoming appointments, etc.  Non-urgent messages can be sent to your provider as well.   To learn more about what you can do with MyChart, go to [email or web address].    Your next appointment:   3 month(s)  The format for your next appointment:   In  Person  Provider:   Debbe Odea, MD   Other Instructions      Signed, Debbe Odea, MD  10/06/2020 10:47 AM     [facility]

## 2020-10-06 NOTE — Patient Instructions (Signed)
Medication Instructions:   Your physician has recommended you make the following change in your medication:   STOP taking your Toprol XL.    *If you need a refill on your cardiac medications before your next appointment, please call your pharmacy*   Lab Work: None ordered If you have labs (blood work) drawn today and your tests are completely normal, you will receive your results only by: Marland Kitchen MyChart Message (if you have MyChart) OR . A paper copy in the mail If you have any lab test that is abnormal or we need to change your treatment, we will call you to review the results.   Testing/Procedures: None ordered   Follow-Up: At [facility], you and your health needs are our priority.  As part of our continuing mission to provide you with exceptional heart care, we have created designated Provider Care Teams.  These Care Teams include your primary Cardiologist (physician) and Advanced Practice Providers (APPs -  Physician Assistants and Nurse Practitioners) who all work together to provide you with the care you need, when you need it.  We recommend signing up for the patient portal called "MyChart".  Sign up information is provided on this After Visit Summary.  MyChart is used to connect with patients for Virtual Visits (Telemedicine).  Patients are able to view lab/test results, encounter notes, upcoming appointments, etc.  Non-urgent messages can be sent to your provider as well.   To learn more about what you can do with MyChart, go to [email or web address].    Your next appointment:   3 month(s)  The format for your next appointment:   In Person  Provider:   Kate Sable, MD   Other Instructions

## 2020-10-28 ENCOUNTER — Other Ambulatory Visit: Payer: Self-pay | Admitting: Family Medicine

## 2020-11-05 ENCOUNTER — Encounter: Payer: Self-pay | Admitting: Family Medicine

## 2020-11-05 ENCOUNTER — Other Ambulatory Visit: Payer: Self-pay

## 2020-11-05 ENCOUNTER — Ambulatory Visit (INDEPENDENT_AMBULATORY_CARE_PROVIDER_SITE_OTHER): Payer: Medicare Other | Admitting: Family Medicine

## 2020-11-05 VITALS — BP 140/86 | HR 82 | Temp 96.9°F | Ht 69.0 in | Wt 175.0 lb

## 2020-11-05 DIAGNOSIS — R198 Other specified symptoms and signs involving the digestive system and abdomen: Secondary | ICD-10-CM | POA: Diagnosis not present

## 2020-11-05 DIAGNOSIS — K219 Gastro-esophageal reflux disease without esophagitis: Secondary | ICD-10-CM

## 2020-11-05 DIAGNOSIS — R142 Eructation: Secondary | ICD-10-CM | POA: Insufficient documentation

## 2020-11-05 NOTE — Patient Instructions (Addendum)
Increase nexium to twice daily.  Decrease acidic foods.  Keep appt with Dr. Henrene Pastor as planned.  Call if new symptoms occurring.. chest pain, fever abdominal pain.

## 2020-11-05 NOTE — Progress Notes (Signed)
Patient ID: Mason [name], male    DOB: [birth year], 75 y.o.   MRN: [id number]  This visit was conducted in person.  BP 140/86   Pulse 82   Temp (!) 96.9 F (36.1 C) (Temporal)   Ht 5\' 9"  (1.753 m)   Wt 175 lb (79.4 kg)   SpO2 96%   BMI 25.84 kg/m    CC:  Chief Complaint  Patient presents with  . Digestive Issues    Subjective:   HPI: Mason [name] is a 75 y.o. male presenting on 11/05/2020 for Digestive Issues  Has history of  GERD, hiatal hernia on nexium 40 mg ONE daily  HX of IBS  PCP Para March  Today he reports he is having issues with jumping feeling in epigastrium after eating.. ongoing x 1 week.  He  Is having belching more frequently.  No trouble getting solids and fluids down, no swallowing issues. Feels early satiety at times... oc feeling full up to neck. Feels better with burping.   No nausea, no weight loss.  No change in diet, no new medications.  No abd pain  BM occurring daily    Has appt in 1 month with Dr. Marina Goodell.      Endoscopy in 2015 Dr. Marina Goodell Relevant past medical, surgical, family and social history reviewed and updated as indicated. Interim medical history since our last visit reviewed. Allergies and medications reviewed and updated. Outpatient Medications Prior to Visit  Medication Sig Dispense Refill  . b complex vitamins capsule Take 1 capsule by mouth daily.    . bimatoprost (LUMIGAN) 0.01 % SOLN Apply 1 drop to eye daily.    . Cholecalciferol (VITAMIN D) 125 MCG (5000 UT) CAPS Take 1 tablet by mouth daily.    Marland Kitchen esomeprazole (NEXIUM) 40 MG capsule TAKE 1 CAPSULE BY MOUTH TWICE EVERY DAY 60 capsule 0  . levocetirizine (XYZAL) 5 MG tablet TAKE 1 TABLET BY MOUTH EVERY DAY IN THE EVENING 30 tablet 12  . rivaroxaban (XARELTO) 20 MG TABS tablet Take 1 tablet (20 mg total) by mouth daily with supper. 90 tablet 3  . tamsulosin (FLOMAX) 0.4 MG CAPS capsule TAKE 1-2 CAPSULES (0.4- 0.8 MG TOTAL) BY MOUTH DAILY. 180 capsule 4  . timolol  (TIMOPTIC-XR) 0.5 % ophthalmic gel-forming Place 1 drop into the left eye every morning.    . vitamin C (ASCORBIC ACID) 500 MG tablet Take 500 mg by mouth daily.    Marland Kitchen zinc gluconate 50 MG tablet Take 50 mg by mouth daily.    . cholecalciferol (VITAMIN D) 1000 UNITS tablet Take 4,000 Units by mouth daily.      No facility-administered medications prior to visit.     Per HPI unless specifically indicated in ROS section below Review of Systems  Constitutional: Negative for fatigue and fever.  HENT: Negative for ear pain.   Eyes: Negative for pain.  Respiratory: Negative for cough and shortness of breath.   Cardiovascular: Negative for chest pain, palpitations and leg swelling.  Gastrointestinal: Positive for abdominal distention. Negative for abdominal pain, blood in stool, constipation, diarrhea, nausea and vomiting.  Genitourinary: Negative for dysuria.  Musculoskeletal: Negative for arthralgias.  Neurological: Negative for syncope, light-headedness and headaches.  Psychiatric/Behavioral: Negative for dysphoric mood.   Objective:  BP 140/86   Pulse 82   Temp (!) 96.9 F (36.1 C) (Temporal)   Ht 5\' 9"  (1.753 m)   Wt 175 lb (79.4 kg)   SpO2 96%   BMI 25.84 kg/m  Wt Readings from Last 3 Encounters:  11/05/20 175 lb (79.4 kg)  10/06/20 173 lb (78.5 kg)  09/04/20 176 lb (79.8 kg)      Physical Exam Constitutional:      Appearance: He is well-developed.  HENT:     Head: Normocephalic.     Right Ear: Hearing normal.     Left Ear: Hearing normal.     Nose: Nose normal.  Neck:     Thyroid: No thyroid mass or thyromegaly.     Vascular: No carotid bruit.     Trachea: Trachea normal.  Cardiovascular:     Rate and Rhythm: Normal rate and regular rhythm.     Pulses: Normal pulses.     Heart sounds: Heart sounds not distant. No murmur heard. No friction rub. No gallop.      Comments: No peripheral edema Pulmonary:     Effort: Pulmonary effort is normal. No respiratory  distress.     Breath sounds: Normal breath sounds.  Skin:    General: Skin is warm and dry.     Findings: No rash.  Psychiatric:        Speech: Speech normal.        Behavior: Behavior normal.        Thought Content: Thought content normal.       Results for orders placed or performed in visit on 07/07/20  PSA, Medicare  Result Value Ref Range   PSA 3.75 0.10 - 4.00 ng/ml  TSH  Result Value Ref Range   TSH 1.55 0.35 - 4.50 uIU/mL  CBC with Differential/Platelet  Result Value Ref Range   WBC 10.4 4.0 - 10.5 K/uL   RBC 4.95 4.22 - 5.81 Mil/uL   Hemoglobin 15.4 13.0 - 17.0 g/dL   HCT 16.1 09.6 - 04.5 %   MCV 92.4 78.0 - 100.0 fl   MCHC 33.7 30.0 - 36.0 g/dL   RDW 40.9 81.1 - 91.4 %   Platelets 368.0 150.0 - 400.0 K/uL   Neutrophils Relative % 69.1 43.0 - 77.0 %   Lymphocytes Relative 21.1 12.0 - 46.0 %   Monocytes Relative 6.6 3.0 - 12.0 %   Eosinophils Relative 2.3 0.0 - 5.0 %   Basophils Relative 0.9 0.0 - 3.0 %   Neutro Abs 7.2 1.4 - 7.7 K/uL   Lymphs Abs 2.2 0.7 - 4.0 K/uL   Monocytes Absolute 0.7 0.1 - 1.0 K/uL   Eosinophils Absolute 0.2 0.0 - 0.7 K/uL   Basophils Absolute 0.1 0.0 - 0.1 K/uL  Lipid panel  Result Value Ref Range   Cholesterol 119 0 - 200 mg/dL   Triglycerides 78.2 0.0 - 149.0 mg/dL   HDL 95.62 (L) >13.08 mg/dL   VLDL 65.7 0.0 - 84.6 mg/dL   LDL Cholesterol 67 0 - 99 mg/dL   Total CHOL/HDL Ratio 4    NonHDL 85.27   Comprehensive metabolic panel  Result Value Ref Range   Sodium 136 135 - 145 mEq/L   Potassium 4.9 3.5 - 5.1 mEq/L   Chloride 98 96 - 112 mEq/L   CO2 33 (H) 19 - 32 mEq/L   Glucose, Bld 85 70 - 99 mg/dL   BUN 15 6 - 23 mg/dL   Creatinine, Ser 9.62 0.40 - 1.50 mg/dL   Total Bilirubin 0.6 0.2 - 1.2 mg/dL   Alkaline Phosphatase 61 39 - 117 U/L   AST 21 0 - 37 U/L   ALT 17 0 - 53 U/L   Total Protein 6.8  6.0 - 8.3 g/dL   Albumin 3.9 3.5 - 5.2 g/dL   GFR 78.29 >56.21 mL/min   Calcium 8.9 8.4 - 10.5 mg/dL    This visit occurred  during the SARS-CoV-2 public health emergency.  Safety protocols were in place, including screening questions prior to the visit, additional usage of staff PPE, and extensive cleaning of exam room while observing appropriate contact time as indicated for disinfecting solutions.   COVID 19 screen:  No recent travel or known exposure to COVID19 The patient denies respiratory symptoms of COVID 19 at this time. The importance of social distancing was discussed today.   Assessment and Plan    Jumping feeling in abdomen, belching and early satiety in pt with GERD, hiatal hernia history  No red flags.  No sig of pulmonary or cardiovascular cause. Not currently in Afib but on anticoagulation. Pt is not describing heart palpitations.  ? Hiatal hernia/ stomach irritation/ esophogeal spasm vs diaphragmatic spasm.  Start with increase in PPI, avoid acidic foods.  Keep follow up with GI as planned.  Kerby Nora, MD

## 2020-11-11 ENCOUNTER — Other Ambulatory Visit: Payer: Self-pay | Admitting: Family Medicine

## 2020-12-09 ENCOUNTER — Encounter: Payer: Self-pay | Admitting: Internal Medicine

## 2020-12-09 ENCOUNTER — Ambulatory Visit (INDEPENDENT_AMBULATORY_CARE_PROVIDER_SITE_OTHER): Payer: Medicare Other | Admitting: Internal Medicine

## 2020-12-09 VITALS — BP 132/88 | HR 60 | Ht 69.0 in | Wt 176.5 lb

## 2020-12-09 DIAGNOSIS — R1310 Dysphagia, unspecified: Secondary | ICD-10-CM

## 2020-12-09 DIAGNOSIS — K219 Gastro-esophageal reflux disease without esophagitis: Secondary | ICD-10-CM | POA: Diagnosis not present

## 2020-12-09 DIAGNOSIS — Z7901 Long term (current) use of anticoagulants: Secondary | ICD-10-CM | POA: Diagnosis not present

## 2020-12-09 NOTE — Patient Instructions (Signed)
You have been scheduled for an endoscopy. Please follow written instructions given to you at your visit today. If you use inhalers (even only as needed), please bring them with you on the day of your procedure.  If you are age 76 or older, your body mass index should be between 23-30. Your Body mass index is 26.06 kg/m. If this is out of the aforementioned range listed, please consider follow up with your Primary Care Provider.  Due to recent changes in healthcare laws, you may see the results of your imaging and laboratory studies on MyChart before your provider has had a chance to review them.  We understand that in some cases there may be results that are confusing or concerning to you. Not all laboratory results come back in the same time frame and the provider may be waiting for multiple results in order to interpret others.  Please give [place] 48 hours in order for your provider to thoroughly review all the results before contacting the office for clarification of your results.

## 2020-12-09 NOTE — Progress Notes (Signed)
HISTORY OF PRESENT ILLNESS:  Mason [name] is a 75 y.o. male with a history of GERD and diverticulitis.  Last seen November 05, 2019 when he underwent colonoscopy after a bout of diverticulitis incidentally noted vascular thrombosis.  He was found to have diverticulosis and a diminutive tubular adenoma which was removed.  No other abnormal findings.  No routine follow-up surveillance recommended.  He presents today with a 6-week history of an abnormal sensation in Mason [name] abdomen that he describes as "jumping".  This occurs once maybe twice per day.  It is almost like a hiccup.  He has had increased belching.  He continues on Nexium once daily with good control classic reflux symptoms.  No dysphagia.  No weight loss.  He is concerned about possible esophageal cancer as an acquaintance of Mason [name] has succumbed from this disease.  GI review of systems is otherwise unremarkable.  Mason [name] last upper endoscopy was performed January 2015.  This was normal.  Review of outside blood work from November 2021 shows normal comprehensive metabolic panel, normal CBC with hemoglobin 15.4.  REVIEW OF SYSTEMS:  All non-GI ROS negative unless otherwise stated in HPI  Past Medical History:  Diagnosis Date  . Actinic keratosis 04/02/2013   L sup scapula - bx proven  . Actinic keratosis 04/02/2013   R med ant deltoid - bx proven  . Allergy   . Anal fissure   . Anxiety   . Basal cell carcinoma    back - treated in the past   . Cancer ([place])    basal cell removed x4  . Cataract    bil eyes  . Depression   . Diverticulosis of colon   . GERD (gastroesophageal reflux disease)    esophageal narrowing  . Glaucoma   . Hiatal hernia   . IBS (irritable bowel syndrome)   . Insomnia   . Internal hemorrhoids     Past Surgical History:  Procedure Laterality Date  . ANAL SPHINCTEROTOMY  03/1995   fistulotomy  . APPENDECTOMY    . BASAL CELL CARCINOMA EXCISION     on back  . BICEPT TENODESIS Right 09/27/2004   decompression   . COLONOSCOPY  04/1999   internal hemmroids,12-11-08 colon tics and hems only   . ESOPHAGOGASTRODUODENOSCOPY  04/1999   with esophagitis and stricture  . MOHS SURGERY  09/02/2008   L supratip of nose with flap,basal cell cancer [facility]/MOHS  . MOUTH SURGERY  01/2020  . TONGUE SURGERY  2017   had small place removed suspected of cancer  . TONSILLECTOMY AND ADENOIDECTOMY     remote  . UPPER GASTROINTESTINAL ENDOSCOPY    . WRIST SURGERY Right    13 years ago  . WRIST SURGERY     scar tissue removed    Social History Mason [name]  reports that he quit smoking about 22 years ago. Mason [name] smoking use included cigarettes and cigars. He has never used smokeless tobacco. He reports previous alcohol use. He reports that he does not use drugs.  family history includes Bladder Cancer in Mason [name] father; Breast cancer in Mason [name] maternal aunt; Cancer in Mason [name] maternal aunt and maternal grandmother; Colon cancer in Mason [name] maternal aunt; Esophageal cancer in Mason [name] maternal aunt; Hypertension in Mason [name] mother; Pneumonia in Mason [name] father; Stomach cancer in Mason [name] maternal aunt; Stroke in Mason [name] mother.  Allergies  Allergen Reactions  . Chlordiazepoxide-Clidinium     REACTION: urinary retention  . Citalopram Hydrobromide     Intolerant, tried 03/2011.  "It made  me very angry".  . Erythromycin Base     REACTION: stomach upset  . Hyoscyamine Sulfate     REACTION: urinary retention  . Meloxicam     GI upset  . Penicillins     REACTION: Itching, rash  . Shrimp [Shellfish Allergy]     And crab- rash, GI upset, upper airway symptoms       PHYSICAL EXAMINATION: Vital signs: BP 132/88 (BP Location: Left Arm, Patient Position: Sitting, Cuff Size: Normal)   Pulse 60   Ht 5\' 9"  (1.753 m)   Wt 176 lb 8 oz (80.1 kg)   BMI 26.06 kg/m   Constitutional: generally well-appearing, no acute distress Psychiatric: alert and oriented x3, cooperative Eyes: extraocular movements intact, anicteric, conjunctiva pink Mouth: oral pharynx  moist, no lesions Neck: supple no lymphadenopathy Cardiovascular: heart regular rate and rhythm, no murmur Lungs: clear to auscultation bilaterally Abdomen: soft, nontender, nondistended, no obvious ascites, no peritoneal signs, normal bowel sounds, no organomegaly Rectal: Omitted Extremities: no clubbing, cyanosis, or lower extremity edema bilaterally Skin: no lesions on visible extremities Neuro: No focal deficits.  Cranial nerves intact  ASSESSMENT:  1.  Hiccup-like sensation and belching (dyspepsia). 2.  GERD.  Seemingly controlled with Nexium 3.  History of diverticulitis   PLAN:  1.  Reflux precautions 2.  Continue Nexium 3.  Diagnostic upper endoscopy to evaluate dyspeptic symptoms.  Patient is high risk given Mason [name] comorbidities and anticoagulation state.  He will remain on Xarelto.The nature of the procedure, as well as the risks, benefits, and alternatives were carefully and thoroughly reviewed with the patient. Ample time for discussion and questions allowed. The patient understood, was satisfied, and agreed to proceed.

## 2020-12-10 ENCOUNTER — Ambulatory Visit (AMBULATORY_SURGERY_CENTER): Payer: Medicare Other | Admitting: Internal Medicine

## 2020-12-10 ENCOUNTER — Encounter: Payer: Self-pay | Admitting: Internal Medicine

## 2020-12-10 ENCOUNTER — Other Ambulatory Visit: Payer: Self-pay

## 2020-12-10 VITALS — BP 123/84 | HR 62 | Temp 98.5°F | Resp 18 | Ht 69.0 in | Wt 176.0 lb

## 2020-12-10 DIAGNOSIS — K219 Gastro-esophageal reflux disease without esophagitis: Secondary | ICD-10-CM

## 2020-12-10 MED ORDER — SODIUM CHLORIDE 0.9 % IV SOLN: 500.0000 mL | Freq: Once | INTRAVENOUS | Status: DC

## 2020-12-10 NOTE — Op Note (Signed)
[facility] Patient Name: Mason [name] Procedure Date: 12/10/2020 3:05 PM MRN: [id number] Endoscopist: Wilhemina Bonito. Marina Goodell , MD Age: 75 Referring MD:  Date of Birth: Jul 12, 1946 Gender: Male Account #: [id number] Procedure:                Upper GI endoscopy Indications:              Dyspepsia, Esophageal reflux Medicines:                Monitored Anesthesia Care Procedure:                Pre-Anesthesia Assessment:                           - Prior to the procedure, a History and Physical                            was performed, and patient medications and                            allergies were reviewed. The patient's tolerance of                            previous anesthesia was also reviewed. The risks                            and benefits of the procedure and the sedation                            options and risks were discussed with the patient.                            All questions were answered, and informed consent                            was obtained. Prior Anticoagulants: The patient has                            taken Xarelto (rivaroxaban), last dose was 1 day                            prior to procedure. ASA Grade Assessment: III - A                            patient with severe systemic disease. After                            reviewing the risks and benefits, the patient was                            deemed in satisfactory condition to undergo the                            procedure.  After obtaining informed consent, the endoscope was                            passed under direct vision. Throughout the                            procedure, the patient's blood pressure, pulse, and                            oxygen saturations were monitored continuously. The                            Endoscope was introduced through the mouth, and                            advanced to the second part of duodenum. The upper                             GI endoscopy was accomplished without difficulty.                            The patient tolerated the procedure well. Scope In: Scope Out: Findings:                 The esophagus was normal.                           The stomach was normal.                           The examined duodenum was normal.                           The cardia and gastric fundus were normal on                            retroflexion. Complications:            No immediate complications. Estimated Blood Loss:     Estimated blood loss: none. Impression:               - Normal esophagus.                           - Normal stomach.                           - Normal examined duodenum.                           - No specimens collected. Recommendation:           - Patient has a contact number available for                            emergencies. The signs and symptoms of potential  delayed complications were discussed with the                            patient. Return to normal activities tomorrow.                            Written discharge instructions were provided to the                            patient.                           - Resume previous diet.                           - Continue present medications, including Xarelto.                           - Return to the care of your primary provider. GI                            follow-up as needed Wilhemina Bonito. Marina Goodell, MD 12/10/2020 3:24:20 PM This report has been signed electronically.

## 2020-12-10 NOTE — Progress Notes (Signed)
Pt Drowsy. VSS. To PACU, report to RN. No anesthetic complications noted.  

## 2020-12-10 NOTE — Patient Instructions (Signed)
Thank you for allowing [place] to care for you today!  Resume previous diet and medications ( including Xaretlto) today.  Return to your normal daily activities tomorrow.   YOU HAD AN ENDOSCOPIC PROCEDURE TODAY AT [facility]:   Refer to the procedure report that was given to you for any specific questions about what was found during the examination.  If the procedure report does not answer your questions, please call your gastroenterologist to clarify.  If you requested that your care partner not be given the details of your procedure findings, then the procedure report has been included in a sealed envelope for you to review at your convenience later.  YOU SHOULD EXPECT: Some feelings of bloating in the abdomen. Passage of more gas than usual.  Walking can help get rid of the air that was put into your GI tract during the procedure and reduce the bloating. If you had a lower endoscopy (such as a colonoscopy or flexible sigmoidoscopy) you may notice spotting of blood in your stool or on the toilet paper. If you underwent a bowel prep for your procedure, you may not have a normal bowel movement for a few days.  Please Note:  You might notice some irritation and congestion in your nose or some drainage.  This is from the oxygen used during your procedure.  There is no need for concern and it should clear up in a day or so.  SYMPTOMS TO REPORT IMMEDIATELY:    Following upper endoscopy (EGD)  Vomiting of blood or coffee ground material  New chest pain or pain under the shoulder blades  Painful or persistently difficult swallowing  New shortness of breath  Fever of 100F or higher  Black, tarry-looking stools  For urgent or emergent issues, a gastroenterologist can be reached at any hour by calling [phone]. Do not use MyChart messaging for urgent concerns.    DIET:  We do recommend a small meal at first, but then you may proceed to your regular diet.  Drink plenty of  fluids but you should avoid alcoholic beverages for 24 hours.  ACTIVITY:  You should plan to take it easy for the rest of today and you should NOT DRIVE or use heavy machinery until tomorrow (because of the sedation medicines used during the test).    FOLLOW UP: Our staff will call the number listed on your records 48-72 hours following your procedure to check on you and address any questions or concerns that you may have regarding the information given to you following your procedure. If we do not reach you, we will leave a message.  We will attempt to reach you two times.  During this call, we will ask if you have developed any symptoms of COVID 19. If you develop any symptoms (ie: fever, flu-like symptoms, shortness of breath, cough etc.) before then, please call [phone].  If you test positive for Covid 19 in the 2 weeks post procedure, please call and report this information to [place].    If any biopsies were taken you will be contacted by phone or by letter within the next 1-3 weeks.  Please call us at [phone] if you have not heard about the biopsies in 3 weeks.    SIGNATURES/CONFIDENTIALITY: You and/or your care partner have signed paperwork which will be entered into your electronic medical record.  These signatures attest to the fact that that the information above on your After Visit Summary has been reviewed and is  understood.  Full responsibility of the confidentiality of this discharge information lies with you and/or your care-partner. 

## 2020-12-14 ENCOUNTER — Telehealth: Payer: Self-pay | Admitting: *Deleted

## 2020-12-14 NOTE — Telephone Encounter (Signed)
  Follow up Call-  Call back number 12/10/2020 11/05/2019  Post procedure Call Back phone  # [phone]  or [phone]  Permission to leave phone message Yes Yes  Some recent data might be hidden     Patient questions:  Do you have a fever, pain , or abdominal swelling? No. Pain Score  0 *  Have you tolerated food without any problems? Yes.    Have you been able to return to your normal activities? Yes.    Do you have any questions about your discharge instructions: Diet   No. Medications  No. Follow up visit  No.  Do you have questions or concerns about your Care? No.  Actions: * If pain score is 4 or above: No action needed, pain <4.  1. Have you developed a fever since your procedure? no  2.   Have you had an respiratory symptoms (SOB or cough) since your procedure? no  3.   Have you tested positive for COVID 19 since your procedure no  4.   Have you had any family members/close contacts diagnosed with the COVID 19 since your procedure? no   If yes to any of these questions please route to Joylene John, RN and Joella Prince, RN

## 2021-01-06 ENCOUNTER — Other Ambulatory Visit: Payer: Self-pay | Admitting: Family Medicine

## 2021-01-07 ENCOUNTER — Other Ambulatory Visit: Payer: Self-pay

## 2021-01-07 ENCOUNTER — Ambulatory Visit (INDEPENDENT_AMBULATORY_CARE_PROVIDER_SITE_OTHER): Payer: Medicare Other | Admitting: Cardiology

## 2021-01-07 ENCOUNTER — Telehealth: Payer: Self-pay

## 2021-01-07 ENCOUNTER — Encounter: Payer: Self-pay | Admitting: Cardiology

## 2021-01-07 VITALS — BP 120/90 | HR 63 | Ht 69.0 in | Wt 175.0 lb

## 2021-01-07 DIAGNOSIS — I81 Portal vein thrombosis: Secondary | ICD-10-CM | POA: Diagnosis not present

## 2021-01-07 DIAGNOSIS — I480 Paroxysmal atrial fibrillation: Secondary | ICD-10-CM

## 2021-01-07 MED ORDER — RIVAROXABAN 20 MG PO TABS
20.0000 mg | ORAL_TABLET | Freq: Every day | ORAL | 1 refills | Status: DC
Start: 2021-01-07 — End: 2021-01-07

## 2021-01-07 MED ORDER — RIVAROXABAN 20 MG PO TABS
20.0000 mg | ORAL_TABLET | Freq: Every day | ORAL | 1 refills | Status: DC
Start: 2021-01-07 — End: 2021-10-21

## 2021-01-07 NOTE — Telephone Encounter (Signed)
Called to let the pt know that we sent in refill

## 2021-01-07 NOTE — Telephone Encounter (Signed)
Pt returned the call and stated that he wanted the rx to go to the [facility] so I sent it there instead for xarelto

## 2021-01-07 NOTE — Progress Notes (Signed)
Cardiology Office Note:    Date:  01/07/2021   ID:  Rodrique, Constable 26-Oct-1945, MRN [id number]  PCP:  Joaquim Nam, MD  Cardiologist:  None  Electrophysiologist:  None   Referring MD: Joaquim Nam, MD   Chief Complaint  Patient presents with  . Other    3 month follow up. Meds reviewed verbally with patient.     History of Present Illness:    Mason [name] is a 75 y.o. male with a hx of paroxysmal atrial fibrillation on Eliquis, hypertension, portal venous thrombosis, anxiety who presents for follow-up.  Patient is being seen for A. fib.  Previously was on Toprol-XL, states being dizzy and presyncopal occasionally.  Toprol-XL was stopped after last visit.  Tolerating Xarelto with no bleeding effects.  Blood pressure has stayed normal off Toprol-XL.  Had 1 episode of dizziness 2 days ago when he woke up at night trying to use the bathroom.  Previous episode was 2 to 3 months ago.  He felt dizzy, felt his heart rate be fast, heart beats were regular.  Did not check his blood pressures at this time.  He laid down with resolution of symptoms but times.  Otherwise feels well, has no other concerns at this time.  Prior notes Patient has history of abdominal discomfort, abdominal imaging with CT abdomen pelvis with contrast revealed acute diverticulitis and portal vein thrombosis.  He was started on antibiotics and Eliquis.  He was seen by GI and hematology.  Anticoagulation for 3 to 6 months was recommended.  Patient later on noticed to have intermittent tachycardia on telemetry.  EKG on 09/20/2019 showed atrial fibrillation with heart rate 109.  Subsequent ECGs showed sinus rhythm.  Echocardiogram obtained on 08/2019 showed low normal ejection fraction of 50 to 55%, left and right atrial sizes were normal.  Past Medical History:  Diagnosis Date  . Actinic keratosis 04/02/2013   L sup scapula - bx proven  . Actinic keratosis 04/02/2013   R med ant deltoid - bx proven  .  Allergy   . Anal fissure   . Anxiety   . Basal cell carcinoma    back - treated in the past   . Cancer (HCC)    basal cell removed x4  . Cataract    bil eyes  . Depression   . Diverticulosis of colon   . GERD (gastroesophageal reflux disease)    esophageal narrowing  . Glaucoma   . Hiatal hernia   . IBS (irritable bowel syndrome)   . Insomnia   . Internal hemorrhoids     Past Surgical History:  Procedure Laterality Date  . ANAL SPHINCTEROTOMY  03/1995   fistulotomy  . APPENDECTOMY    . BASAL CELL CARCINOMA EXCISION     on back  . BICEPT TENODESIS Right 09/27/2004   decompression  . COLONOSCOPY  04/1999   internal hemmroids,12-11-08 colon tics and hems only   . ESOPHAGOGASTRODUODENOSCOPY  04/1999   with esophagitis and stricture  . MOHS SURGERY  09/02/2008   L supratip of nose with flap,basal cell cancer [facility]/MOHS  . MOUTH SURGERY  01/2020  . TONGUE SURGERY  2017   had small place removed suspected of cancer  . TONSILLECTOMY AND ADENOIDECTOMY     remote  . UPPER GASTROINTESTINAL ENDOSCOPY    . WRIST SURGERY Right    13 years ago  . WRIST SURGERY     scar tissue removed    Current Medications: Current Meds  Medication Sig  . b complex vitamins capsule Take 1 capsule by mouth daily.  . bimatoprost (LUMIGAN) 0.01 % SOLN Apply 1 drop to eye daily.  . Cholecalciferol (VITAMIN D) 125 MCG (5000 UT) CAPS Take 1 tablet by mouth daily.  Marland Kitchen esomeprazole (NEXIUM) 40 MG capsule TAKE 1 CAPSULE BY MOUTH TWICE EVERY DAY  . levocetirizine (XYZAL) 5 MG tablet TAKE 1 TABLET BY MOUTH EVERY DAY IN THE EVENING  . tamsulosin (FLOMAX) 0.4 MG CAPS capsule TAKE 1-2 CAPSULES (0.4- 0.8 MG TOTAL) BY MOUTH DAILY.  Marland Kitchen timolol (TIMOPTIC-XR) 0.5 % ophthalmic gel-forming Place 1 drop into the left eye every morning.  . vitamin C (ASCORBIC ACID) 500 MG tablet Take 500 mg by mouth daily.  Marland Kitchen zinc gluconate 50 MG tablet Take 50 mg by mouth daily.  . [DISCONTINUED] rivaroxaban (XARELTO) 20 MG TABS  tablet Take 1 tablet (20 mg total) by mouth daily with supper.     Allergies:   Chlordiazepoxide-clidinium, Citalopram hydrobromide, Erythromycin base, Hyoscyamine sulfate, Meloxicam, Penicillins, and Shrimp [shellfish allergy]   Social History   Socioeconomic History  . Marital status: Divorced    Spouse name: Not on file  . Number of children: 1  . Years of education: Not on file  . Highest education level: Not on file  Occupational History  . Occupation: Art therapist, retired 1997    Employer: [employer]  Tobacco Use  . Smoking status: Former Smoker    Types: Cigarettes, Cigars    Quit date: 08/22/1998    Years since quitting: 22.3  . Smokeless tobacco: Never Used  . Tobacco comment: cigarettes 1968, cigars 2000  Vaping Use  . Vaping Use: Never used  Substance and Sexual Activity  . Alcohol use: Not Currently    Alcohol/week: 0.0 standard drinks    Comment: rare  . Drug use: No  . Sexual activity: Never  Other Topics Concern  . Not on file  Social History Narrative   Retired from [employer]   From [place]   Prev divorced    1 son   [place] '63-'67, no known agent orange exposure   Social Determinants of Corporate investment banker Strain: Low Risk   . Difficulty of Paying Living Expenses: Not hard at all  Food Insecurity: No Food Insecurity  . Worried About Programme researcher, broadcasting/film/video in the Last Year: Never true  . Ran Out of Food in the Last Year: Never true  Transportation Needs: No Transportation Needs  . Lack of Transportation (Medical): No  . Lack of Transportation (Non-Medical): No  Physical Activity: Inactive  . Days of Exercise per Week: 0 days  . Minutes of Exercise per Session: 0 min  Stress: No Stress Concern Present  . Feeling of Stress : Not at all  Social Connections: Not on file     Family History: The patient's family history includes Bladder Cancer in his father; Breast cancer in his maternal aunt; Cancer in his maternal aunt and maternal  grandmother; Colon cancer in his maternal aunt; Esophageal cancer in his maternal aunt; Hypertension in his mother; Pneumonia in his father; Stomach cancer in his maternal aunt; Stroke in his mother. There is no history of Prostate cancer or Rectal cancer.  ROS:   Please see the history of present illness.     All other systems reviewed and are negative.  EKGs/Labs/Other Studies Reviewed:    The following studies were reviewed today:   EKG:  EKG is  ordered today.  The ekg  ordered today demonstrates normal sinus rhythm, normal ECG.  Recent Labs: 07/07/2020: ALT 17; BUN 15; Creatinine, Ser 1.08; Hemoglobin 15.4; Platelets 368.0; Potassium 4.9; Sodium 136; TSH 1.55  Recent Lipid Panel    Component Value Date/Time   CHOL 119 07/07/2020 0926   TRIG 89.0 07/07/2020 0926   HDL 33.40 (L) 07/07/2020 0926   CHOLHDL 4 07/07/2020 0926   VLDL 17.8 07/07/2020 0926   LDLCALC 67 07/07/2020 0926    Physical Exam:    VS:  BP 120/90 (BP Location: Left Arm, Patient Position: Sitting, Cuff Size: Normal)   Pulse 63   Ht 5\' 9"  (1.753 m)   Wt 175 lb (79.4 kg)   SpO2 98%   BMI 25.84 kg/m     Wt Readings from Last 3 Encounters:  01/07/21 175 lb (79.4 kg)  12/10/20 176 lb (79.8 kg)  12/09/20 176 lb 8 oz (80.1 kg)     GEN:  Well nourished, well developed in no acute distress HEENT: Normal NECK: No JVD; No carotid bruits LYMPHATICS: No lymphadenopathy CARDIAC: RRR, no murmurs, rubs, gallops RESPIRATORY:  Clear to auscultation without rales, wheezing or rhonchi  ABDOMEN: Soft, non-tender, non-distended MUSCULOSKELETAL:  No edema; No deformity  SKIN: Warm and dry NEUROLOGIC:  Alert and oriented x 3 PSYCHIATRIC:  Normal affect   ASSESSMENT:    1. Paroxysmal atrial fibrillation (HCC)   2. Portal vein thrombosis    PLAN:    In order of problems listed above:  1. Patient with history of paroxysmal atrial fibrillation.  Currently in sinus rhythm.  CHA2DS2-VASc score equals 2.  echo showed  normal EF, EF 50 to 55%.  Continue Xarelto.  Encouraged to check blood pressure when he develops symptoms of dizziness.  Patient advised to sit on side of bed, slowly rise to standing from sitting position.  Adequate hydration also advised.  2. History of portal vein thrombosis, follows up with hematology.  On Xarelto.  Follow-up in 3 months.   Medication Adjustments/Labs and Tests Ordered: Current medicines are reviewed at length with the patient today.  Concerns regarding medicines are outlined above.  Orders Placed This Encounter  Procedures  . EKG 12-Lead   No orders of the defined types were placed in this encounter.   Patient Instructions  Medication Instructions:  Your physician recommends that you continue on your current medications as directed. Please refer to the Current Medication list given to you today.  *If you need a refill on your cardiac medications before your next appointment, please call your pharmacy*   Lab Work: None ordered If you have labs (blood work) drawn today and your tests are completely normal, you will receive your results only by: Marland Kitchen MyChart Message (if you have MyChart) OR . A paper copy in the mail If you have any lab test that is abnormal or we need to change your treatment, we will call you to review the results.   Testing/Procedures: None ordered   Follow-Up: At [facility], you and your health needs are our priority.  As part of our continuing mission to provide you with exceptional heart care, we have created designated Provider Care Teams.  These Care Teams include your primary Cardiologist (physician) and Advanced Practice Providers (APPs -  Physician Assistants and Nurse Practitioners) who all work together to provide you with the care you need, when you need it.  We recommend signing up for the patient portal called "MyChart".  Sign up information is provided on this After Visit Summary.  MyChart is used to connect with patients for  Virtual Visits (Telemedicine).  Patients are able to view lab/test results, encounter notes, upcoming appointments, etc.  Non-urgent messages can be sent to your provider as well.   To learn more about what you can do with MyChart, go to [email or web address].    Your next appointment:   6 month(s)  The format for your next appointment:   In Person  Provider:   Debbe Odea, MD   Other Instructions      Signed, Debbe Odea, MD  01/07/2021 12:33 PM    [facility]

## 2021-01-07 NOTE — Addendum Note (Signed)
Addended by: Allean Found on: 01/07/2021 11:40 AM   Modules accepted: Orders

## 2021-01-07 NOTE — Telephone Encounter (Signed)
41m, 79.4kg, 1.08 07/07/20, lovw/agboretang 01/07/21, ccr 65.3

## 2021-01-07 NOTE — Patient Instructions (Signed)

## 2021-01-08 NOTE — Telephone Encounter (Signed)
[facility] [address] [facility] is [facility] previous order.  Patient concerned he is going to be without meds due to mistake.  Please call to assist with options.

## 2021-01-11 NOTE — Telephone Encounter (Signed)
Called and offered a sample of xarelto to pt and will await call from him.

## 2021-01-12 ENCOUNTER — Telehealth: Payer: Self-pay | Admitting: Family Medicine

## 2021-01-12 NOTE — Telephone Encounter (Signed)
  LAST APPOINTMENT DATE: 01/06/2021   NEXT APPOINTMENT DATE:@11 /18/2022  MEDICATION: esomeprazole 40 mg    [facility] [address] [place] Patient states that the ins company will not approve it until we call them with clarification  Patient will be out next week.  Let patient know to contact pharmacy at the end of the day to make sure medication is ready.  Please notify patient to allow 48-72 hours to process  Encourage patient to contact the pharmacy for refills or they can request refills through [place]:   LAST REFILL:  QTY:  REFILL DATE:    OTHER COMMENTS:    Okay for refill?  Please advise

## 2021-01-12 NOTE — Telephone Encounter (Signed)
Insurance is no longer covering esomeprazole and pharmacy is requesting an alternative to be sent in. Please advise.

## 2021-01-13 NOTE — Telephone Encounter (Addendum)
What will they cover?  Please let me know.  Thanks.

## 2021-01-14 NOTE — Telephone Encounter (Signed)
Patient called and was notified that insurance is no longer covering nexium and will need to call insurance company to find out what is covered. Once he finds out he can call back and we can send in what is covered.

## 2021-01-20 ENCOUNTER — Encounter: Payer: Self-pay | Admitting: Family Medicine

## 2021-01-20 ENCOUNTER — Other Ambulatory Visit: Payer: Self-pay | Admitting: Family Medicine

## 2021-01-20 NOTE — Telephone Encounter (Signed)
PA has been started in [email or web address]. Key: [id number]; awaiting determination.

## 2021-01-20 NOTE — Telephone Encounter (Signed)
PA was started this am. See TE note.

## 2021-01-20 NOTE — Telephone Encounter (Signed)
He stated that he is out of medication and that [facility] stated that they needed to get a PA and informed him that she going to call.

## 2021-01-21 MED ORDER — ESOMEPRAZOLE MAGNESIUM 40 MG PO CPDR: 40.0000 mg | DELAYED_RELEASE_CAPSULE | Freq: Two times a day (BID) | ORAL | 3 refills | Status: DC

## 2021-01-21 NOTE — Telephone Encounter (Signed)
Patient is requesting a call back to discuss. EM

## 2021-01-21 NOTE — Addendum Note (Signed)
Addended by: Sherrilee Gilles B on: 01/21/2021 09:20 AM   Modules accepted: Orders

## 2021-01-21 NOTE — Telephone Encounter (Signed)
This has already been addressed and discussed with patient. See TE note.

## 2021-01-21 NOTE — Telephone Encounter (Signed)
PA has been denied due to medication being a benefit exclusion. Called and spoke with patient about this. Patient wants to stay in the medication; I checked goodrx and he can get the rx for about $16 at [place] with the goodrx card. Patient wants to use that and have rx sent the [address]. Rx has been sent.

## 2021-03-30 ENCOUNTER — Ambulatory Visit (INDEPENDENT_AMBULATORY_CARE_PROVIDER_SITE_OTHER): Payer: Medicare Other | Admitting: Family Medicine

## 2021-03-30 ENCOUNTER — Encounter: Payer: Self-pay | Admitting: Family Medicine

## 2021-03-30 ENCOUNTER — Other Ambulatory Visit: Payer: Self-pay

## 2021-03-30 DIAGNOSIS — Q383 Other congenital malformations of tongue: Secondary | ICD-10-CM | POA: Diagnosis not present

## 2021-03-30 DIAGNOSIS — M542 Cervicalgia: Secondary | ICD-10-CM | POA: Diagnosis not present

## 2021-03-30 NOTE — Patient Instructions (Addendum)
This could have been a small lymph node or irritation on the right SCM.  I don't feel a mass at this point.  Keep an eye on it and let me know if you notice any changes.   Take care.  Glad to see you.

## 2021-03-30 NOTE — Progress Notes (Signed)
This visit occurred during the SARS-CoV-2 public health emergency.  Safety protocols were in place, including screening questions prior to the visit, additional usage of staff PPE, and extensive cleaning of exam room while observing appropriate contact time as indicated for disinfecting solutions.  He had f/u re: R sided tongue lesion.  No cancer identified with plan for clinical f/u, meantime if worrisome lesions noted, then would need recheck.  He is followed by his dentist for the area.  He goes to dentist twice per year.  Patient also monitors it.  He had chronic changes with inc sensitivity locally since prev excision.  He'll f/u with dental clinic and update me as needed.    Separate issue with jaw pain.  Noted a few weeks ago.  Dull ache "like something isn't right."  Noted more in the last few weeks.  R sided, inferior to the angle of the jaw on the R side. Not at the TMJ.  No L sided pain.  He thought this was separate from the tongue lesion.    He had a cold/virus in June, with a fever/ST/cough.  That recently got better. Per persistent mass but sometimes he feels something on the R side.  No FCNAVD.  No throat pain swallowing, swallowing well.    Meds, vitals, and allergies reviewed.   ROS: Per HPI unless specifically indicated in ROS section   Nad Ncat TM wnl OP wnl except for chronic changes on the R side of the tongue with linear scar, no ulceration.  Neck supple, no LA Rrr Ctab Prominent R SCM.

## 2021-03-31 DIAGNOSIS — M542 Cervicalgia: Secondary | ICD-10-CM | POA: Insufficient documentation

## 2021-03-31 DIAGNOSIS — Q383 Other congenital malformations of tongue: Secondary | ICD-10-CM | POA: Insufficient documentation

## 2021-03-31 NOTE — Assessment & Plan Note (Signed)
He has some chronic changes on the right side of the tongue with a linear scar with no ulceration.  He will monitor this and follow-up with the dental clinic.  He did not have cancerous/malignant changes on previous biopsy.

## 2021-03-31 NOTE — Assessment & Plan Note (Addendum)
Discussed with patient.  This could have been a small lymph node or irritation on the right SCM.  I don't feel a mass at this point.  I asked him to monitor it and let me know if he notices any changes.  Anatomy discussed with patient.  He agrees with plan.  I do not suspect this is related to the tongue situation described above.

## 2021-05-01 ENCOUNTER — Encounter: Payer: Self-pay | Admitting: Family Medicine

## 2021-05-03 ENCOUNTER — Ambulatory Visit (INDEPENDENT_AMBULATORY_CARE_PROVIDER_SITE_OTHER): Payer: Medicare Other | Admitting: Family Medicine

## 2021-05-03 ENCOUNTER — Other Ambulatory Visit: Payer: Self-pay

## 2021-05-03 ENCOUNTER — Encounter: Payer: Self-pay | Admitting: Family Medicine

## 2021-05-03 VITALS — BP 156/98 | HR 67 | Temp 97.0°F | Ht 69.0 in | Wt 169.0 lb

## 2021-05-03 DIAGNOSIS — R6884 Jaw pain: Secondary | ICD-10-CM | POA: Insufficient documentation

## 2021-05-03 NOTE — Patient Instructions (Addendum)
I don't feel an enlarged lymph node in your neck.   I do think it makes sense to see ENT again.  Let us call about getting an appointment with Dr. Richardson Landry.   Take care.  Glad to see you.

## 2021-05-03 NOTE — Telephone Encounter (Signed)
Can patient come in today?  Please schedule today if possible.  Thanks.

## 2021-05-03 NOTE — Progress Notes (Signed)
This visit occurred during the SARS-CoV-2 public health emergency.  Safety protocols were in place, including screening questions prior to the visit, additional usage of staff PPE, and extensive cleaning of exam room while observing appropriate contact time as indicated for disinfecting solutions.  Aching sensation on the R anterior neck, anterior to the right sternocleidomastoid.  Some pain with eating, when moving his tongue.  Less pain when not eating.  H/o R side of tongue with changes at baseline.  No FCNAVD.  No external skin changes.  No L sided pain.  No bruising or redness.    He had seen Dr. Richardson Landry in the past with ENT.  He had prev eval with Dr. Henrene Pastor with GI.    Meds, vitals, and allergies reviewed.   ROS: Per HPI unless specifically indicated in ROS section   Nad Ncat MMM with R lateral tongue with chronic changes.  No acute changes or ulceration noted on the tongue. Mild irritation on the B buccal membranes along with top edge of the lower molars but no other oral irritation or lesions noted. TM wnl On exam he notes that his discomfort is just inferior and anterior to the angle of the R jaw.   R SCM not tender to palpation. No rash. No lymphadenopathy felt in the neck.  No other masses felt in the neck.  No clavicular lymphadenopathy bilaterally. Rrr

## 2021-05-05 NOTE — Assessment & Plan Note (Signed)
I do not see any obvious findings in the neck to explain his symptoms and we talked about getting ENT input.  See after visit summary.  Patient agrees with plan.  Okay for outpatient follow-up.  I greatly appreciate ENT input.

## 2021-05-06 ENCOUNTER — Ambulatory Visit (INDEPENDENT_AMBULATORY_CARE_PROVIDER_SITE_OTHER): Payer: Medicare Other | Admitting: Dermatology

## 2021-05-06 ENCOUNTER — Other Ambulatory Visit: Payer: Self-pay

## 2021-05-06 DIAGNOSIS — L578 Other skin changes due to chronic exposure to nonionizing radiation: Secondary | ICD-10-CM

## 2021-05-06 DIAGNOSIS — L820 Inflamed seborrheic keratosis: Secondary | ICD-10-CM

## 2021-05-06 DIAGNOSIS — Z1283 Encounter for screening for malignant neoplasm of skin: Secondary | ICD-10-CM

## 2021-05-06 DIAGNOSIS — L570 Actinic keratosis: Secondary | ICD-10-CM

## 2021-05-06 DIAGNOSIS — D1800 Hemangioma unspecified site: Secondary | ICD-10-CM

## 2021-05-06 DIAGNOSIS — Z85828 Personal history of other malignant neoplasm of skin: Secondary | ICD-10-CM | POA: Diagnosis not present

## 2021-05-06 DIAGNOSIS — L738 Other specified follicular disorders: Secondary | ICD-10-CM

## 2021-05-06 DIAGNOSIS — L814 Other melanin hyperpigmentation: Secondary | ICD-10-CM

## 2021-05-06 DIAGNOSIS — D229 Melanocytic nevi, unspecified: Secondary | ICD-10-CM

## 2021-05-06 DIAGNOSIS — L821 Other seborrheic keratosis: Secondary | ICD-10-CM

## 2021-05-06 NOTE — Patient Instructions (Signed)
Actinic keratoses are precancerous spots that appear secondary to cumulative UV radiation exposure/sun exposure over time. They are chronic with expected duration over 1 year. A portion of actinic keratoses will progress to squamous cell carcinoma of the skin. It is not possible to reliably predict which spots will progress to skin cancer and so treatment is recommended to prevent development of skin cancer.  Recommend daily broad spectrum sunscreen SPF 30+ to sun-exposed areas, reapply every 2 hours as needed.  Recommend staying in the shade or wearing long sleeves, sun glasses (UVA+UVB protection) and wide brim hats (4-inch brim around the entire circumference of the hat). Call for new or changing lesions.   Melanoma ABCDEs  Melanoma is the most dangerous type of skin cancer, and is the leading cause of death from skin disease.  You are more likely to develop melanoma if you: Have light-colored skin, light-colored eyes, or red or blond hair Spend a lot of time in the sun Tan regularly, either outdoors or in a tanning bed Have had blistering sunburns, especially during childhood Have a close family member who has had a melanoma Have atypical moles or large birthmarks  Early detection of melanoma is key since treatment is typically straightforward and cure rates are extremely high if we catch it early.   The first sign of melanoma is often a change in a mole or a new dark spot.  The ABCDE system is a way of remembering the signs of melanoma.  A for asymmetry:  The two halves do not match. B for border:  The edges of the growth are irregular. C for color:  A mixture of colors are present instead of an even brown color. D for diameter:  Melanomas are usually (but not always) greater than 70m - the size of a pencil eraser. E for evolution:  The spot keeps changing in size, shape, and color.  Please check your skin once per month between visits. You can use a small mirror in front and a large  mirror behind you to keep an eye on the back side or your body.   If you see any new or changing lesions before your next follow-up, please call to schedule a visit.  Please continue daily skin protection including broad spectrum sunscreen SPF 30+ to sun-exposed areas, reapplying every 2 hours as needed when you're outdoors.   Staying in the shade or wearing long sleeves, sun glasses (UVA+UVB protection) and wide brim hats (4-inch brim around the entire circumference of the hat) are also recommended for sun protection.    If you have any questions or concerns for your doctor, please call our main line at [phone] press option 4 to reach your doctor's medical assistant. If no one answers, please leave a voicemail as directed and we will return your call as soon as possible. Messages left after 4 pm will be answered the following business day.   You may also send [place] message via [place] We typically respond to MyChart messages within 1-2 business days.  For prescription refills, please ask your pharmacy to contact our office. Our fax number is [phone]  If you have an urgent issue when the clinic is closed that cannot wait until the next business day, you can page your doctor at the number below.    Please note that while we do our best to be available for urgent issues outside of office hours, we are not available 24/7.   If you have an urgent issue  and are unable to reach [place], you may choose to seek medical care at your doctor's office, retail clinic, urgent care center, or emergency room.  If you have a medical emergency, please immediately call 911 or go to the emergency department.  Pager Numbers  - Dr. Nehemiah Massed: [phone]  - Dr. Laurence Ferrari: [phone]  - Dr. Nicole Kindred: [phone]  In the event of inclement weather, please call our main line at [phone] for an update on the status of any delays or closures.  Dermatology Medication Tips: Please keep the boxes that  topical medications come in in order to help keep track of the instructions about where and how to use these. Pharmacies typically print the medication instructions only on the boxes and not directly on the medication tubes.   If your medication is too expensive, please contact our office at [phone] option 4 or send [place] a message through [place].   We are unable to tell what your co-pay for medications will be in advance as this is different depending on your insurance coverage. However, we may be able to find a substitute medication at lower cost or fill out paperwork to get insurance to cover a needed medication.   If a prior authorization is required to get your medication covered by your insurance company, please allow [place] 1-2 business days to complete this process.  Drug prices often vary depending on where the prescription is filled and some pharmacies may offer cheaper prices.  The website www.goodrx.com contains coupons for medications through different pharmacies. The prices here do not account for what the cost may be with help from insurance (it may be cheaper with your insurance), but the website can give you the price if you did not use any insurance.  - You can print the associated coupon and take it with your prescription to the pharmacy.  - You may also stop by our office during regular business hours and pick up a GoodRx coupon card.  - If you need your prescription sent electronically to a different pharmacy, notify our office through [facility] or by phone at [phone] option 4.

## 2021-05-06 NOTE — Progress Notes (Signed)
Follow-Up Visit   Subjective  Mason [name] is a 75 y.o. male who presents for the following: Follow-up (Patient here today for full body exam. Patient reports some spots at forehead, spots at left wrist, forearm, and spot at inner thigh. ). The patient presents for Total-Body Skin Exam (TBSE) for skin cancer screening and mole check.  The following portions of the chart were reviewed this encounter and updated as appropriate:  Tobacco  Allergies  Meds  Problems  Med Hx  Surg Hx  Fam Hx     Review of Systems: No other skin or systemic complaints except as noted in HPI or Assessment and Plan.  Objective  Well appearing patient in no apparent distress; mood and affect are within normal limits.  A full examination was performed including scalp, head, eyes, ears, nose, lips, neck, chest, axillae, abdomen, back, buttocks, bilateral upper extremities, bilateral lower extremities, hands, feet, fingers, toes, fingernails, and toenails. All findings within normal limits unless otherwise noted below.  face and ear x 3 (3) Erythematous thin papules/macules with gritty scale.   chest, arms and legs x 4 (4) Erythematous keratotic or waxy stuck-on papule or plaque.   right forehead Small yellow papules with a central dell    Assessment & Plan  Actinic keratosis (3) face and ear x 3  Actinic keratoses are precancerous spots that appear secondary to cumulative UV radiation exposure/sun exposure over time. They are chronic with expected duration over 1 year. A portion of actinic keratoses will progress to squamous cell carcinoma of the skin. It is not possible to reliably predict which spots will progress to skin cancer and so treatment is recommended to prevent development of skin cancer.  Recommend daily broad spectrum sunscreen SPF 30+ to sun-exposed areas, reapply every 2 hours as needed.  Recommend staying in the shade or wearing long sleeves, sun glasses (UVA+UVB protection) and  wide brim hats (4-inch brim around the entire circumference of the hat). Call for new or changing lesions.  Destruction of lesion - face and ear x 3 Complexity: simple   Destruction method: cryotherapy   Informed consent: discussed and consent obtained   Timeout:  patient name, date of birth, surgical site, and procedure verified Lesion destroyed using liquid nitrogen: Yes   Region frozen until ice ball extended beyond lesion: Yes   Outcome: patient tolerated procedure well with no complications   Post-procedure details: wound care instructions given    Inflamed seborrheic keratosis chest, arms and legs x 4  Destruction of lesion - chest, arms and legs x 4 Complexity: simple   Destruction method: cryotherapy   Informed consent: discussed and consent obtained   Timeout:  patient name, date of birth, surgical site, and procedure verified Lesion destroyed using liquid nitrogen: Yes   Region frozen until ice ball extended beyond lesion: Yes   Outcome: patient tolerated procedure well with no complications   Post-procedure details: wound care instructions given    Sebaceous hyperplasia of face right forehead  Discussed cosmetic procedure, noncovered.  $60 for 1st lesion and $15 for each additional lesion if done on the same day.  Maximum charge $350.  One touch-up treatment included no charge. Discussed risks of treatment including dyspigmentation, small scar, and/or recurrence. Recommend daily broad spectrum sunscreen SPF 30+/photoprotection to treated areas once healed.  Patient was in agreement and would like area at right forehead  Obtained Signature for Non Covered Procedures   Destruction of lesion - right forehead Complexity: simple  Destruction method comment:  Electrodesiccation Informed consent: discussed and consent obtained   Hemostasis achieved with:  electrodesiccation Post-procedure details: wound care instructions given    Skin cancer screening  Lentigines -  Scattered tan macules - Due to sun exposure - Benign-appearing, observe - Recommend daily broad spectrum sunscreen SPF 30+ to sun-exposed areas, reapply every 2 hours as needed. - Call for any changes  Seborrheic Keratoses - Stuck-on, waxy, tan-brown papules and/or plaques  - Benign-appearing - Discussed benign etiology and prognosis. - Observe - Call for any changes  Melanocytic Nevi - Tan-brown and/or pink-flesh-colored symmetric macules and papules - Benign appearing on exam today - Observation - Call clinic for new or changing moles - Recommend daily use of broad spectrum spf 30+ sunscreen to sun-exposed areas.   Hemangiomas - Red papules - Discussed benign nature - Observe - Call for any changes  Actinic Damage - Chronic condition, secondary to cumulative UV/sun exposure - diffuse scaly erythematous macules with underlying dyspigmentation - Recommend daily broad spectrum sunscreen SPF 30+ to sun-exposed areas, reapply every 2 hours as needed.  - Staying in the shade or wearing long sleeves, sun glasses (UVA+UVB protection) and wide brim hats (4-inch brim around the entire circumference of the hat) are also recommended for sun protection.  - Call for new or changing lesions.  History of Basal Cell Carcinoma of the Skin - No evidence of recurrence today - Recommend regular full body skin exams - Recommend daily broad spectrum sunscreen SPF 30+ to sun-exposed areas, reapply every 2 hours as needed.  - Call if any new or changing lesions are noted between office visits  Skin cancer screening performed today.  Return in about 1 year (around 05/06/2022) for tbse. IRuthell Rummage, CMA, am acting as scribe for Sarina Ser, MD. Documentation: I have reviewed the above documentation for accuracy and completeness, and I agree with the above.  Sarina Ser, MD

## 2021-05-07 ENCOUNTER — Encounter: Payer: Self-pay | Admitting: Family Medicine

## 2021-05-07 NOTE — Telephone Encounter (Signed)
Referral has been sent and pt has been made aware.

## 2021-05-08 ENCOUNTER — Encounter: Payer: Self-pay | Admitting: Dermatology

## 2021-05-11 ENCOUNTER — Ambulatory Visit: Payer: Medicare Other | Admitting: Family Medicine

## 2021-05-14 NOTE — Telephone Encounter (Signed)
Please see what diagnosis will work for them and still applies.  Thanks.

## 2021-05-14 NOTE — Telephone Encounter (Signed)
Pt called stating that the referral needs to say more than just Jaw Ache.

## 2021-05-21 NOTE — Telephone Encounter (Signed)
The diagnosis code was changed 05/07/21 to  Diagnosis  K13.79 (ICD-10-CM) - Mouth pain   I was not working on this referral, another referral coordinator was. This information should have been relayed to them.   He was scheduled for today 05/21/21 to be seen by [facility], I have not received any fax(none in epic) stating that the appt was cancelled nor have they uploaded any OV notes to Care Everywhere.   Message sent to patient to see if he was seen today.

## 2021-06-16 ENCOUNTER — Telehealth: Payer: Self-pay | Admitting: Family Medicine

## 2021-06-16 NOTE — Telephone Encounter (Signed)
Pt called stating that he has a medicare wellness phone call on 07/08/21, but don't understand why he has that and doesn't have blood work scheduled.

## 2021-06-16 NOTE — Telephone Encounter (Signed)
Called patient and left vm.

## 2021-07-05 NOTE — Progress Notes (Signed)
Subjective:   Mason [name] is a 75 y.o. male who presents for Medicare Annual/Subsequent preventive examination.  I connected with Elishua Radford today by telephone and verified that I am speaking with the correct person using two identifiers. Location patient: home Location provider: work Persons participating in the virtual visit: patient, Marine scientist.    I discussed the limitations, risks, security and privacy concerns of performing an evaluation and management service by telephone and the availability of in person appointments. I also discussed with the patient that there may be a patient responsible charge related to this service. The patient expressed understanding and verbally consented to this telephonic visit.    Interactive audio and video telecommunications were attempted between this provider and patient, however failed, due to patient having technical difficulties OR patient did not have access to video capability.  We continued and completed visit with audio only.  Some vital signs may be absent or patient reported.   Time Spent with patient on telephone encounter: 25 minutes  Review of Systems     Cardiac Risk Factors include: advanced age (>35men, >27 women)     Objective:    Today's Vitals   07/08/21 1357  Weight: 169 lb (76.7 kg)  Height: 5\' 9"  (1.753 m)   Body mass index is 24.96 kg/m.  Advanced Directives 07/08/2021 07/07/2020 05/08/2020 10/04/2019 09/18/2019 09/18/2019 07/30/2019  Does Patient Have a Medical Advance Directive? No No No No No No No  Would patient like information on creating a medical advance directive? Yes (MAU/Ambulatory/Procedural Areas - Information given) No - Patient declined - No - Patient declined No - Patient declined No - Patient declined -    Current Medications (verified) Outpatient Encounter Medications as of 07/08/2021  Medication Sig   b complex vitamins capsule Take 1 capsule by mouth daily.   bimatoprost (LUMIGAN) 0.01 % SOLN  Apply 1 drop to eye daily.   Cholecalciferol (VITAMIN D) 125 MCG (5000 UT) CAPS Take 1 tablet by mouth daily.   esomeprazole (NEXIUM) 40 MG capsule Take 1 capsule (40 mg total) by mouth 2 (two) times daily.   levocetirizine (XYZAL) 5 MG tablet TAKE 1 TABLET BY MOUTH EVERY DAY IN THE EVENING   rivaroxaban (XARELTO) 20 MG TABS tablet Take 1 tablet (20 mg total) by mouth daily with supper.   tamsulosin (FLOMAX) 0.4 MG CAPS capsule TAKE 1-2 CAPSULES (0.4- 0.8 MG TOTAL) BY MOUTH DAILY.   timolol (TIMOPTIC-XR) 0.5 % ophthalmic gel-forming Place 1 drop into the left eye every morning.   vitamin C (ASCORBIC ACID) 500 MG tablet Take 500 mg by mouth daily.   zinc gluconate 50 MG tablet Take 50 mg by mouth daily.   [DISCONTINUED] traZODone (DESYREL) 50 MG tablet 0.5-1 tab at night for insomnia.   No facility-administered encounter medications on file as of 07/08/2021.    Allergies (verified) Chlordiazepoxide-clidinium, Citalopram hydrobromide, Erythromycin base, Hyoscyamine sulfate, Meloxicam, Penicillins, and Shrimp [shellfish allergy]   History: Past Medical History:  Diagnosis Date   Actinic keratosis 04/02/2013   L sup scapula - bx proven   Actinic keratosis 04/02/2013   R med ant deltoid - bx proven   Allergy    Anal fissure    Anxiety    Basal cell carcinoma    back - treated in the past    Cancer ([place])    basal cell removed x4   Cataract    bil eyes   Depression    Diverticulosis of colon    GERD (gastroesophageal  reflux disease)    esophageal narrowing   Glaucoma    Hiatal hernia    IBS (irritable bowel syndrome)    Insomnia    Internal hemorrhoids    Past Surgical History:  Procedure Laterality Date   ANAL SPHINCTEROTOMY  03/1995   fistulotomy   APPENDECTOMY     BASAL CELL CARCINOMA EXCISION     on back   BICEPT TENODESIS Right 09/27/2004   decompression   COLONOSCOPY  04/1999   internal hemmroids,12-11-08 colon tics and hems only    ESOPHAGOGASTRODUODENOSCOPY   04/1999   with esophagitis and stricture   MOHS SURGERY  09/02/2008   L supratip of nose with flap,basal cell cancer [facility]/MOHS   MOUTH SURGERY  01/2020   TONGUE SURGERY  2017   had small place removed suspected of cancer   TONSILLECTOMY AND ADENOIDECTOMY     remote   UPPER GASTROINTESTINAL ENDOSCOPY     WRIST SURGERY Right    13 years ago   WRIST SURGERY     scar tissue removed   Family History  Problem Relation Age of Onset   Hypertension Mother    Stroke Mother    Pneumonia Father    Bladder Cancer Father    Cancer Maternal Aunt        ? type   Breast cancer Maternal Aunt        mets   Esophageal cancer Maternal Aunt        aunt ? esophageal ca   Stomach cancer Maternal Aunt        aunt ? stmach ca   Colon cancer Maternal Aunt        had colostomy - not sure of origin   Cancer Maternal Grandmother        ? type   Prostate cancer Neg Hx    Rectal cancer Neg Hx    Social History   Socioeconomic History   Marital status: Divorced    Spouse name: Not on file   Number of children: 1   Years of education: Not on file   Highest education level: Not on file  Occupational History   Occupation: Air cabin crew, retired 1997    Employer: [employer]  Tobacco Use   Smoking status: Former    Types: Cigarettes, Cigars    Quit date: 08/22/1998    Years since quitting: 22.8   Smokeless tobacco: Never   Tobacco comments:    cigarettes 1968, cigars 2000  Vaping Use   Vaping Use: Never used  Substance and Sexual Activity   Alcohol use: Not Currently    Alcohol/week: 0.0 standard drinks    Comment: rare   Drug use: No   Sexual activity: Never  Other Topics Concern   Not on file  Social History Narrative   Retired from House   Prev divorced    1 son   Ellis Savage '63-'67, no known agent orange exposure   Social Determinants of Radio broadcast assistant Strain: Low Risk    Difficulty of Paying Living Expenses: Not hard at all  Food Insecurity: No  Food Insecurity   Worried About Charity fundraiser in the Last Year: Never true   Arboriculturist in the Last Year: Never true  Transportation Needs: No Transportation Needs   Lack of Transportation (Medical): No   Lack of Transportation (Non-Medical): No  Physical Activity: Sufficiently Active   Days of Exercise per Week: 7 days   Minutes  of Exercise per Session: 30 min  Stress: No Stress Concern Present   Feeling of Stress : Not at all  Social Connections: Socially Isolated   Frequency of Communication with Friends and Family: More than three times a week   Frequency of Social Gatherings with Friends and Family: Once a week   Attends Religious Services: Never   Marine scientist or Organizations: No   Attends Music therapist: Never   Marital Status: Divorced    Tobacco Counseling Counseling given: Not Answered Tobacco comments: cigarettes 1968, cigars 2000   Clinical Intake:  Pre-visit preparation completed: Yes  Pain : No/denies pain     BMI - recorded: 24.95 Nutritional Status: BMI 25 -29 Overweight Nutritional Risks: None Diabetes: No  How often do you need to have someone help you when you read instructions, pamphlets, or other written materials from your doctor or pharmacy?: 1 - Never  Diabetic?No  Interpreter Needed?: No  Information entered by :: Orrin Brigham LPN   Activities of Daily Living In your present state of health, do you have any difficulty performing the following activities: 07/08/2021  Hearing? Y  Vision? N  Difficulty concentrating or making decisions? N  Walking or climbing stairs? N  Dressing or bathing? N  Doing errands, shopping? N  Preparing Food and eating ? N  Using the Toilet? N  In the past six months, have you accidently leaked urine? N  Managing your Medications? N  Managing your Finances? N  Housekeeping or managing your Housekeeping? N  Some recent data might be hidden    Patient Care  Team: Tonia Ghent, MD as PCP - General (Family Medicine) Ralene Bathe, MD as Consulting Physician (Dermatology) Pinnix-Bailey, Damaris Schooner, MD as Consulting Physician (Dentistry) Celine Ahr. West Bali, OD as Consulting Physician (Optometry) Pieter Partridge, DO as Consulting Physician (Neurology)  Indicate any recent Medical Services you may have received from other than [facility] providers in the past year (date may be approximate).     Assessment:   This is a routine wellness examination for Del.  Hearing/Vision screen Hearing Screening - Comments:: States decrease clarity in hearing Vision Screening - Comments:: Last exam 06/2021, Dr Gildardo Griffes  Dietary issues and exercise activities discussed: Current Exercise Habits: Home exercise routine, Type of exercise: walking, Time (Minutes): 25, Frequency (Times/Week): 7, Weekly Exercise (Minutes/Week): 175, Intensity: Mild   Goals Addressed             This Visit's Progress    Patient Stated       Would like to continue to take medications as prescibed       Depression Screen PHQ 2/9 Scores 07/08/2021 07/07/2020 07/04/2019 06/26/2018 06/08/2017 05/24/2016 08/18/2015  PHQ - 2 Score 0 0 0 0 0 0 0  PHQ- 9 Score - 0 - 0 0 - -    Fall Risk Fall Risk  07/08/2021 07/07/2020 07/30/2019 07/04/2019 06/26/2018  Falls in the past year? 1 0 0 0 0  Number falls in past yr: 0 0 - - -  Injury with Fall? 0 0 - - -  Risk for fall due to : - Medication side effect - - -  Follow up Falls prevention discussed Falls evaluation completed;Falls prevention discussed - - -    FALL RISK PREVENTION PERTAINING TO THE HOME:  Any stairs in or around the home? Yes  If so, are there any without handrails? No  Home free of loose throw rugs in walkways, pet beds,  electrical cords, etc? Yes  Adequate lighting in your home to reduce risk of falls? Yes   ASSISTIVE DEVICES UTILIZED TO PREVENT FALLS:  Life alert? No  Use of a cane, walker or w/c? No  Grab  bars in the bathroom? Yes  Shower chair or bench in shower? Yes  Elevated toilet seat or a handicapped toilet? No   TIMED UP AND GO:  Was the test performed? No , visit completed over the phone.   Cognitive Function: Normal cognitive status assessed by this Nurse Health Advisor. No abnormalities found.   MMSE - Mini Mental State Exam 07/07/2020 06/26/2018 06/08/2017 05/24/2016  Orientation to time 5 5 5 5   Orientation to Place 5 5 5 5   Registration 3 3 3 3   Attention/ Calculation 5 0 0 0  Recall 3 3 3 3   Language- name 2 objects - 0 0 0  Language- repeat 1 1 1 1   Language- follow 3 step command - 3 3 3   Language- read & follow direction - 0 0 0  Write a sentence - 0 0 0  Copy design - 0 0 0  Total score - 20 20 20         Immunizations Immunization History  Administered Date(s) Administered   Fluad Quad(high Dose 65+) 04/25/2019, 07/07/2020   Influenza Split 05/17/2011, 05/31/2012   Influenza, High Dose Seasonal PF 05/23/2018, 05/31/2021   Influenza,inj,Quad PF,6+ Mos 04/29/2013, 05/01/2014, 05/13/2015, 05/24/2016   Influenza-Unspecified 05/12/2017   Pneumococcal Conjugate-13 08/28/2014   Pneumococcal Polysaccharide-23 10/28/2010   Td 07/22/2002, 11/03/2011   Zoster Recombinat (Shingrix) 06/12/2019, 08/28/2019    TDAP status: Up to date  Flu Vaccine status: Up to date  Pneumococcal vaccine status: Up to date  Covid-19 vaccine status: Declined, Education has been provided regarding the importance of this vaccine but patient still declined. Advised may receive this vaccine at local pharmacy or Health Dept.or vaccine clinic. Aware to provide a copy of the vaccination record if obtained from local pharmacy or Health Dept. Verbalized acceptance and understanding.  Qualifies for Shingles Vaccine? Yes   Zostavax completed No   Shingrix Completed?: Yes  Screening Tests Health Maintenance  Topic Date Due   COVID-19 Vaccine (1) 07/23/2021 (Originally 02/18/1947)    TETANUS/TDAP  11/02/2021   Pneumonia Vaccine 32+ Years old  Completed   INFLUENZA VACCINE  Completed   Hepatitis C Screening  Completed   Zoster Vaccines- Shingrix  Completed   HPV VACCINES  Aged Out   COLONOSCOPY (Pts 45-77yrs Insurance coverage will need to be confirmed)  Discontinued    Health Maintenance  There are no preventive care reminders to display for this patient.  Colorectal cancer screening: No longer required.   Lung Cancer Screening: (Low Dose CT Chest recommended if Age 64-80 years, 30 pack-year currently smoking OR have quit w/in 15years.) does not qualify.     Additional Screening:  Hepatitis C Screening: does qualify; Completed 09/19/19  Vision Screening: Recommended annual ophthalmology exams for early detection of glaucoma and other disorders of the eye. Is the patient up to date with their annual eye exam?  Yes  Who is the provider or what is the name of the office in which the patient attends annual eye exams? Dr. Gildardo Griffes   Dental Screening: Recommended annual dental exams for proper oral hygiene  Community Resource Referral / Chronic Care Management: CRR required this visit?  No   CCM required this visit?  No      Plan:     I  have personally reviewed and noted the following in the patient's chart:   Medical and social history Use of alcohol, tobacco or illicit drugs  Current medications and supplements including opioid prescriptions. Patient is not currently taking opioid prescriptions. Functional ability and status Nutritional status Physical activity Advanced directives List of other physicians Hospitalizations, surgeries, and ER visits in previous 12 months Vitals Screenings to include cognitive, depression, and falls Referrals and appointments  In addition, I have reviewed and discussed with patient certain preventive protocols, quality metrics, and best practice recommendations. A written personalized care plan for preventive  services as well as general preventive health recommendations were provided to patient.   Due to this being a telephonic visit, the after visit summary with patients personalized plan was offered to patient via mail or my-chart.  Patient would like to access on my-chart.   Loma Messing, LPN  [id number]   Nurse Health Advisor  Nurse Notes: None

## 2021-07-08 ENCOUNTER — Ambulatory Visit (INDEPENDENT_AMBULATORY_CARE_PROVIDER_SITE_OTHER): Payer: Medicare Other

## 2021-07-08 VITALS — Ht 69.0 in | Wt 169.0 lb

## 2021-07-08 DIAGNOSIS — Z0000 Encounter for general adult medical examination without abnormal findings: Secondary | ICD-10-CM

## 2021-07-08 NOTE — Patient Instructions (Signed)
[name] , Thank you for taking time to complete your Medicare Wellness Visit. I appreciate your ongoing commitment to your health goals. Please review the following plan we discussed and let me know if I can assist you in the future.   Screening recommendations/referrals: Colonoscopy: no longer required  Recommended yearly ophthalmology/optometry visit for glaucoma screening and checkup Recommended yearly dental visit for hygiene and checkup  Vaccinations: Influenza vaccine: up to date Pneumococcal vaccine: up to date Tdap vaccine: up to date, completed 11/03/11, due 11/02/21 Shingles vaccine: up date    Covid-19: newest booster available at your local pharmacy  Advanced directives: information available at your next appointment  Conditions/risks identified: see problem list   Next appointment: Follow up in one year for your annual wellness visit.   Preventive Care 24 Years and Older, Male Preventive care refers to lifestyle choices and visits with your health care provider that can promote health and wellness. What does preventive care include? A yearly physical exam. This is also called an annual well check. Dental exams once or twice a year. Routine eye exams. Ask your health care provider how often you should have your eyes checked. Personal lifestyle choices, including: Daily care of your teeth and gums. Regular physical activity. Eating a healthy diet. Avoiding tobacco and drug use. Limiting alcohol use. Practicing safe sex. Taking low doses of aspirin every day. Taking vitamin and mineral supplements as recommended by your health care provider. What happens during an annual well check? The services and screenings done by your health care provider during your annual well check will depend on your age, overall health, lifestyle risk factors, and family history of disease. Counseling  Your health care provider may ask you questions about your: Alcohol use. Tobacco  use. Drug use. Emotional well-being. Home and relationship well-being. Sexual activity. Eating habits. History of falls. Memory and ability to understand (cognition). Work and work Statistician. Screening  You may have the following tests or measurements: Height, weight, and BMI. Blood pressure. Lipid and cholesterol levels. These may be checked every 5 years, or more frequently if you are over 28 years old. Skin check. Lung cancer screening. You may have this screening every year starting at age 43 if you have a 30-pack-year history of smoking and currently smoke or have quit within the past 15 years. Fecal occult blood test (FOBT) of the stool. You may have this test every year starting at age 78. Flexible sigmoidoscopy or colonoscopy. You may have a sigmoidoscopy every 5 years or a colonoscopy every 10 years starting at age 62. Prostate cancer screening. Recommendations will vary depending on your family history and other risks. Hepatitis C blood test. Hepatitis B blood test. Sexually transmitted disease (STD) testing. Diabetes screening. This is done by checking your blood sugar (glucose) after you have not eaten for a while (fasting). You may have this done every 1-3 years. Abdominal aortic aneurysm (AAA) screening. You may need this if you are a current or former smoker. Osteoporosis. You may be screened starting at age 47 if you are at high risk. Talk with your health care provider about your test results, treatment options, and if necessary, the need for more tests. Vaccines  Your health care provider may recommend certain vaccines, such as: Influenza vaccine. This is recommended every year. Tetanus, diphtheria, and acellular pertussis (Tdap, Td) vaccine. You may need a Td booster every 10 years. Zoster vaccine. You may need this after age 71. Pneumococcal 13-valent conjugate (PCV13) vaccine. One dose  is recommended after age 74. Pneumococcal polysaccharide (PPSV23) vaccine.  One dose is recommended after age 60. Talk to your health care provider about which screenings and vaccines you need and how often you need them. This information is not intended to replace advice given to you by your health care provider. Make sure you discuss any questions you have with your health care provider. Document Released: 09/04/2015 Document Revised: 04/27/2016 Document Reviewed: 06/09/2015 Elsevier Interactive Patient Education  2017 [place] Prevention in the Home Falls can cause injuries. They can happen to people of all ages. There are many things you can do to make your home safe and to help prevent falls. What can I do on the outside of my home? Regularly fix the edges of walkways and driveways and fix any cracks. Remove anything that might make you trip as you walk through a door, such as a raised step or threshold. Trim any bushes or trees on the path to your home. Use bright outdoor lighting. Clear any walking paths of anything that might make someone trip, such as rocks or tools. Regularly check to see if handrails are loose or broken. Make sure that both sides of any steps have handrails. Any raised decks and porches should have guardrails on the edges. Have any leaves, snow, or ice cleared regularly. Use sand or salt on walking paths during winter. Clean up any spills in your garage right away. This includes oil or grease spills. What can I do in the bathroom? Use night lights. Install grab bars by the toilet and in the tub and shower. Do not use towel bars as grab bars. Use non-skid mats or decals in the tub or shower. If you need to sit down in the shower, use a plastic, non-slip stool. Keep the floor dry. Clean up any water that spills on the floor as soon as it happens. Remove soap buildup in the tub or shower regularly. Attach bath mats securely with double-sided non-slip rug tape. Do not have throw rugs and other things on the floor that can make  you trip. What can I do in the bedroom? Use night lights. Make sure that you have a light by your bed that is easy to reach. Do not use any sheets or blankets that are too big for your bed. They should not hang down onto the floor. Have a firm chair that has side arms. You can use this for support while you get dressed. Do not have throw rugs and other things on the floor that can make you trip. What can I do in the kitchen? Clean up any spills right away. Avoid walking on wet floors. Keep items that you use a lot in easy-to-reach places. If you need to reach something above you, use a strong step stool that has a grab bar. Keep electrical cords out of the way. Do not use floor polish or wax that makes floors slippery. If you must use wax, use non-skid floor wax. Do not have throw rugs and other things on the floor that can make you trip. What can I do with my stairs? Do not leave any items on the stairs. Make sure that there are handrails on both sides of the stairs and use them. Fix handrails that are broken or loose. Make sure that handrails are as long as the stairways. Check any carpeting to make sure that it is firmly attached to the stairs. Fix any carpet that is loose or worn. Avoid  having throw rugs at the top or bottom of the stairs. If you do have throw rugs, attach them to the floor with carpet tape. Make sure that you have a light switch at the top of the stairs and the bottom of the stairs. If you do not have them, ask someone to add them for you. What else can I do to help prevent falls? Wear shoes that: Do not have high heels. Have rubber bottoms. Are comfortable and fit you well. Are closed at the toe. Do not wear sandals. If you use a stepladder: Make sure that it is fully opened. Do not climb a closed stepladder. Make sure that both sides of the stepladder are locked into place. Ask someone to hold it for you, if possible. Clearly mark and make sure that you can  see: Any grab bars or handrails. First and last steps. Where the edge of each step is. Use tools that help you move around (mobility aids) if they are needed. These include: Canes. Walkers. Scooters. Crutches. Turn on the lights when you go into a dark area. Replace any light bulbs as soon as they burn out. Set up your furniture so you have a clear path. Avoid moving your furniture around. If any of your floors are uneven, fix them. If there are any pets around you, be aware of where they are. Review your medicines with your doctor. Some medicines can make you feel dizzy. This can increase your chance of falling. Ask your doctor what other things that you can do to help prevent falls. This information is not intended to replace advice given to you by your health care provider. Make sure you discuss any questions you have with your health care provider. Document Released: 06/04/2009 Document Revised: 01/14/2016 Document Reviewed: 09/12/2014 Elsevier Interactive Patient Education  2017 [employer].

## 2021-07-09 ENCOUNTER — Ambulatory Visit: Payer: Medicare Other

## 2021-07-11 ENCOUNTER — Other Ambulatory Visit: Payer: Self-pay | Admitting: Family Medicine

## 2021-07-11 DIAGNOSIS — E786 Lipoprotein deficiency: Secondary | ICD-10-CM

## 2021-07-11 DIAGNOSIS — I4891 Unspecified atrial fibrillation: Secondary | ICD-10-CM

## 2021-07-11 DIAGNOSIS — Z125 Encounter for screening for malignant neoplasm of prostate: Secondary | ICD-10-CM

## 2021-07-21 ENCOUNTER — Other Ambulatory Visit: Payer: Medicare Other

## 2021-07-23 ENCOUNTER — Encounter: Payer: Medicare Other | Admitting: Family Medicine

## 2021-08-24 ENCOUNTER — Other Ambulatory Visit: Payer: Medicare Other

## 2021-08-31 ENCOUNTER — Encounter: Payer: Medicare Other | Admitting: Family Medicine

## 2021-09-21 ENCOUNTER — Other Ambulatory Visit: Payer: Self-pay | Admitting: Family Medicine

## 2021-10-14 ENCOUNTER — Other Ambulatory Visit: Payer: Self-pay

## 2021-10-14 ENCOUNTER — Encounter: Payer: Self-pay | Admitting: Cardiology

## 2021-10-14 ENCOUNTER — Ambulatory Visit (INDEPENDENT_AMBULATORY_CARE_PROVIDER_SITE_OTHER): Payer: Medicare Other | Admitting: Cardiology

## 2021-10-14 VITALS — BP 110/66 | HR 71 | Ht 69.5 in | Wt 172.0 lb

## 2021-10-14 DIAGNOSIS — I480 Paroxysmal atrial fibrillation: Secondary | ICD-10-CM

## 2021-10-14 DIAGNOSIS — I81 Portal vein thrombosis: Secondary | ICD-10-CM | POA: Diagnosis not present

## 2021-10-14 NOTE — Patient Instructions (Signed)

## 2021-10-14 NOTE — Progress Notes (Signed)
Cardiology Office Note:    Date:  10/14/2021   ID:  Angelo, Burris Dec 29, 1945, MRN [id number]  PCP:  Joaquim Nam, MD  Cardiologist:  None  Electrophysiologist:  None   Referring MD: Joaquim Nam, MD   Chief Complaint  Patient presents with   OTher    Past due 6 month follow up -- Meds reviewed verbally with patient.     History of Present Illness:    JERY CERBONE is a 76 y.o. male with a hx of paroxysmal atrial fibrillation on Eliquis, hypertension, portal venous thrombosis, anxiety who presents for follow-up.  Patient is being seen for paroxysmal atrial fibrillation.  Not on AV nodal blocking agents due to previous history of dizziness on Toprol-XL.  Tolerating Xarelto 20 mg daily, no bleeding issues.  Denies palpitations.  States having occasional/rare skipped heartbeats.  No dizziness, no syncope.   Prior notes Patient has history of abdominal discomfort, abdominal imaging with CT abdomen pelvis with contrast revealed acute diverticulitis and portal vein thrombosis.  He was started on antibiotics and Eliquis.  He was seen by GI and hematology.  Anticoagulation for 3 to 6 months was recommended.  Patient later on noticed to have intermittent tachycardia on telemetry.   EKG on 09/20/2019 showed atrial fibrillation with heart rate 109.  Subsequent ECGs showed sinus rhythm.  Echocardiogram obtained on 08/2019 showed low normal ejection fraction of 50 to 55%, left and right atrial sizes were normal.  Past Medical History:  Diagnosis Date   Actinic keratosis 04/02/2013   L sup scapula - bx proven   Actinic keratosis 04/02/2013   R med ant deltoid - bx proven   Allergy    Anal fissure    Anxiety    Basal cell carcinoma    back - treated in the past    Cancer (HCC)    basal cell removed x4   Cataract    bil eyes   Depression    Diverticulosis of colon    GERD (gastroesophageal reflux disease)    esophageal narrowing   Glaucoma    Hiatal hernia    IBS  (irritable bowel syndrome)    Insomnia    Internal hemorrhoids     Past Surgical History:  Procedure Laterality Date   ANAL SPHINCTEROTOMY  03/1995   fistulotomy   APPENDECTOMY     BASAL CELL CARCINOMA EXCISION     on back   BICEPT TENODESIS Right 09/27/2004   decompression   COLONOSCOPY  04/1999   internal hemmroids,12-11-08 colon tics and hems only    ESOPHAGOGASTRODUODENOSCOPY  04/1999   with esophagitis and stricture   MOHS SURGERY  09/02/2008   L supratip of nose with flap,basal cell cancer [facility]/MOHS   MOUTH SURGERY  01/2020   TONGUE SURGERY  2017   had small place removed suspected of cancer   TONSILLECTOMY AND ADENOIDECTOMY     remote   UPPER GASTROINTESTINAL ENDOSCOPY     WRIST SURGERY Right    13 years ago   WRIST SURGERY     scar tissue removed    Current Medications: Current Meds  Medication Sig   b complex vitamins capsule Take 1 capsule by mouth daily.   bimatoprost (LUMIGAN) 0.01 % SOLN Apply 1 drop to eye daily.   Cholecalciferol (VITAMIN D) 125 MCG (5000 UT) CAPS Take 1 tablet by mouth daily.   esomeprazole (NEXIUM) 40 MG capsule Take 1 capsule by mouth twice daily   levocetirizine (XYZAL) 5 MG  tablet TAKE 1 TABLET BY MOUTH EVERY DAY IN THE EVENING   rivaroxaban (XARELTO) 20 MG TABS tablet Take 1 tablet (20 mg total) by mouth daily with supper.   tamsulosin (FLOMAX) 0.4 MG CAPS capsule TAKE 1-2 CAPSULES (0.4- 0.8 MG TOTAL) BY MOUTH DAILY.   timolol (TIMOPTIC-XR) 0.5 % ophthalmic gel-forming Place 1 drop into the left eye every morning.   vitamin C (ASCORBIC ACID) 500 MG tablet Take 500 mg by mouth daily.   zinc gluconate 50 MG tablet Take 50 mg by mouth daily.     Allergies:   Chlordiazepoxide-clidinium, Citalopram hydrobromide, Erythromycin base, Hyoscyamine sulfate, Meloxicam, Penicillins, and Shrimp [shellfish allergy]   Social History   Socioeconomic History   Marital status: Divorced    Spouse name: Not on file   Number of children: 1   Years  of education: Not on file   Highest education level: Not on file  Occupational History   Occupation: Art therapist, retired 1997    Employer: [employer]  Tobacco Use   Smoking status: Former    Types: Cigarettes, Cigars    Quit date: 08/22/1998    Years since quitting: 23.1   Smokeless tobacco: Never   Tobacco comments:    cigarettes 1968, cigars 2000  Vaping Use   Vaping Use: Never used  Substance and Sexual Activity   Alcohol use: Not Currently    Alcohol/week: 0.0 standard drinks    Comment: rare   Drug use: No   Sexual activity: Never  Other Topics Concern   Not on file  Social History Narrative   Retired from [place]   From [place]   Prev divorced    1 son   [place] '63-'67, no known agent orange exposure   Social Determinants of Corporate investment banker Strain: Low Risk    Difficulty of Paying Living Expenses: Not hard at all  Food Insecurity: No Food Insecurity   Worried About Programme researcher, broadcasting/film/video in the Last Year: Never true   Barista in the Last Year: Never true  Transportation Needs: No Transportation Needs   Lack of Transportation (Medical): No   Lack of Transportation (Non-Medical): No  Physical Activity: Sufficiently Active   Days of Exercise per Week: 7 days   Minutes of Exercise per Session: 30 min  Stress: No Stress Concern Present   Feeling of Stress : Not at all  Social Connections: Socially Isolated   Frequency of Communication with Friends and Family: More than three times a week   Frequency of Social Gatherings with Friends and Family: Once a week   Attends Religious Services: Never   Database administrator or Organizations: No   Attends Engineer, structural: Never   Marital Status: Divorced     Family History: The patient's family history includes Bladder Cancer in his father; Breast cancer in his maternal aunt; Cancer in his maternal aunt and maternal grandmother; Colon cancer in his maternal aunt; Esophageal cancer in  his maternal aunt; Hypertension in his mother; Pneumonia in his father; Stomach cancer in his maternal aunt; Stroke in his mother. There is no history of Prostate cancer or Rectal cancer.  ROS:   Please see the history of present illness.     All other systems reviewed and are negative.  EKGs/Labs/Other Studies Reviewed:    The following studies were reviewed today:   EKG:  EKG is  ordered today.  The ekg ordered today demonstrates normal sinus rhythm, normal ECG.  Recent Labs: No results found for requested labs within last 8760 hours.  Recent Lipid Panel    Component Value Date/Time   CHOL 119 07/07/2020 0926   TRIG 89.0 07/07/2020 0926   HDL 33.40 (L) 07/07/2020 0926   CHOLHDL 4 07/07/2020 0926   VLDL 17.8 07/07/2020 0926   LDLCALC 67 07/07/2020 0926    Physical Exam:    VS:  BP 110/66 (BP Location: Left Arm, Patient Position: Sitting, Cuff Size: Normal)    Pulse 71    Ht 5' 9.5" (1.765 m)    Wt 172 lb (78 kg)    SpO2 98%    BMI 25.04 kg/m     Wt Readings from Last 3 Encounters:  10/14/21 172 lb (78 kg)  07/08/21 169 lb (76.7 kg)  05/03/21 169 lb (76.7 kg)     GEN:  Well nourished, well developed in no acute distress HEENT: Normal NECK: No JVD; No carotid bruits LYMPHATICS: No lymphadenopathy CARDIAC: RRR, no murmurs, rubs, gallops RESPIRATORY:  Clear to auscultation without rales, wheezing or rhonchi  ABDOMEN: Soft, non-tender, non-distended MUSCULOSKELETAL:  No edema; No deformity  SKIN: Warm and dry NEUROLOGIC:  Alert and oriented x 3 PSYCHIATRIC:  Normal affect   ASSESSMENT:    1. Paroxysmal atrial fibrillation (HCC)   2. Portal vein thrombosis     PLAN:    In order of problems listed above:  paroxysmal atrial fibrillation.  Currently in sinus rhythm.  Cardiac monitor x2 weeks on 10/2019 no evidence of A-fib.  Denies palpitations, dizziness.  CHA2DS2-VASc score equals 2.  echo 08/2019 showed EF 50 to 55%.  Continue Xarelto.   History of portal vein  thrombosis, follows up with hematology.  On Xarelto.  Follow-up in 1 year   Medication Adjustments/Labs and Tests Ordered: Current medicines are reviewed at length with the patient today.  Concerns regarding medicines are outlined above.  Orders Placed This Encounter  Procedures   EKG 12-Lead   No orders of the defined types were placed in this encounter.   Patient Instructions  Medication Instructions:  Your physician recommends that you continue on your current medications as directed. Please refer to the Current Medication list given to you today.  *If you need a refill on your cardiac medications before your next appointment, please call your pharmacy*   Lab Work: None ordered If you have labs (blood work) drawn today and your tests are completely normal, you will receive your results only by: MyChart Message (if you have MyChart) OR A paper copy in the mail If you have any lab test that is abnormal or we need to change your treatment, we will call you to review the results.   Testing/Procedures: None ordered   Follow-Up: At [facility], you and your health needs are our priority.  As part of our continuing mission to provide you with exceptional heart care, we have created designated Provider Care Teams.  These Care Teams include your primary Cardiologist (physician) and Advanced Practice Providers (APPs -  Physician Assistants and Nurse Practitioners) who all work together to provide you with the care you need, when you need it.  We recommend signing up for the patient portal called "MyChart".  Sign up information is provided on this After Visit Summary.  MyChart is used to connect with patients for Virtual Visits (Telemedicine).  Patients are able to view lab/test results, encounter notes, upcoming appointments, etc.  Non-urgent messages can be sent to your provider as well.   To learn  more about what you can do with MyChart, go to [email or web address].    Your  next appointment:   1 year(s)  The format for your next appointment:   In Person  Provider:   You may see Debbe Odea, MD or one of the following Advanced Practice Providers on your designated Care Team:   Nicolasa Ducking, NP Eula Listen, PA-C Cadence Fransico Michael, [place]    Other Instructions     Signed, Debbe Odea, MD  10/14/2021 9:06 AM    [facility]

## 2021-10-19 ENCOUNTER — Other Ambulatory Visit: Payer: Self-pay

## 2021-10-19 DIAGNOSIS — I4891 Unspecified atrial fibrillation: Secondary | ICD-10-CM

## 2021-10-19 NOTE — Telephone Encounter (Addendum)
Received fax for Xarelto 20mg  QD from [facility].  Pt last saw Dr Garen Lah 10/14/21, last labs 07/07/20 Creat 1.08, pt is overdue for labwork.  Age 76, weight 78kg, pt needs updated labwork to check Xarelto dosage. Pt's PCP is [place], so updated labs would be in  [place].  Will call pt and notify he needs updated labwork to check Xarelto dosage. Pt has lab orders in chart from PCP, had CPX scheduled but pt cancelled. Attempted to call pt, LMOM TCB.

## 2021-10-20 ENCOUNTER — Other Ambulatory Visit: Payer: Self-pay | Admitting: *Deleted

## 2021-10-20 NOTE — Telephone Encounter (Signed)
Pt returned called. Lab orders placed for pt to have labs drawn at [facility] in [place] on 10/21/21. Pt verbalized understanding and stated he had enough medication until labs are resulted.  ?

## 2021-10-20 NOTE — Telephone Encounter (Signed)
Xarelto 20 mg refill request received. Pt is 76 years old, weight- 78 kg, Crea-1.08 on 07/07/20 - pt overdue for labs, last seen by Dr. Garen Lah on 10/14/21, Diagnosis-, CrCl- ; pt had BMET drawn today - will await results before sending in refill.  ?

## 2021-10-21 ENCOUNTER — Other Ambulatory Visit
Admission: RE | Admit: 2021-10-21 | Discharge: 2021-10-21 | Disposition: A | Discharge status: Home / Self Care | Payer: Medicare Other | Attending: Cardiology | Admitting: Cardiology

## 2021-10-21 DIAGNOSIS — I4891 Unspecified atrial fibrillation: Secondary | ICD-10-CM | POA: Insufficient documentation

## 2021-10-21 LAB — BASIC METABOLIC PANEL
Anion gap: 6 (ref 5–15)
BUN: 16 mg/dL (ref 8–23)
CO2: 32 mmol/L (ref 22–32)
Calcium: 9.2 mg/dL (ref 8.9–10.3)
Chloride: 100 mmol/L (ref 98–111)
Creatinine, Ser: 1.07 mg/dL (ref 0.61–1.24)
GFR, Estimated: >60 mL/min (ref >60)
Glucose, Bld: 103 mg/dL — ABNORMAL HIGH (ref 70–99)
Potassium: 4 mmol/L (ref 3.5–5.1)
Sodium: 138 mmol/L (ref 135–145)

## 2021-10-21 LAB — CBC
HCT: 51.5 % (ref 39.0–52.0)
Hemoglobin: 16.6 g/dL (ref 13.0–17.0)
MCH: 29.9 pg (ref 26.0–34.0)
MCHC: 32.2 g/dL (ref 30.0–36.0)
MCV: 92.8 fL (ref 80.0–100.0)
Platelets: 396 10*3/uL (ref 150–400)
RBC: 5.55 MIL/uL (ref 4.22–5.81)
RDW: 15 % (ref 11.5–15.5)
WBC: 11.3 10*3/uL — ABNORMAL HIGH (ref 4.0–10.5)
nRBC: 0 % (ref 0.0–0.2)

## 2021-10-21 MED ORDER — RIVAROXABAN 20 MG PO TABS: 20.0000 mg | ORAL_TABLET | Freq: Every day | ORAL | 1 refills | Status: DC

## 2021-10-21 NOTE — Telephone Encounter (Signed)
Prescription refill request for Xarelto received.  ?Indication: Afib  ?Last office visit: 10/14/21 ([place])  ?Weight: 78kg ?Age: 76 ?Scr:1.07 (10/21/21) ?CrCl: 63.74ml/min ? ?Appropriate dose and refill sent to requested pharmacy.  ?

## 2021-11-16 ENCOUNTER — Other Ambulatory Visit: Payer: Self-pay

## 2021-11-16 ENCOUNTER — Other Ambulatory Visit (INDEPENDENT_AMBULATORY_CARE_PROVIDER_SITE_OTHER): Payer: Medicare Other

## 2021-11-16 DIAGNOSIS — Z125 Encounter for screening for malignant neoplasm of prostate: Secondary | ICD-10-CM | POA: Diagnosis not present

## 2021-11-16 DIAGNOSIS — E786 Lipoprotein deficiency: Secondary | ICD-10-CM | POA: Diagnosis not present

## 2021-11-16 DIAGNOSIS — I4891 Unspecified atrial fibrillation: Secondary | ICD-10-CM

## 2021-11-16 LAB — LIPID PANEL
Cholesterol: 126 mg/dL (ref 0–200)
HDL: 39.3 mg/dL (ref >39.00)
LDL Cholesterol: 66 mg/dL (ref 0–99)
NonHDL: 86.59
Total CHOL/HDL Ratio: 3
Triglycerides: 103 mg/dL (ref 0.0–149.0)
VLDL: 20.6 mg/dL (ref 0.0–40.0)

## 2021-11-16 LAB — TSH: TSH: 1.18 u[IU]/mL (ref 0.35–5.50)

## 2021-11-16 LAB — COMPREHENSIVE METABOLIC PANEL
ALT: 20 U/L (ref 0–53)
AST: 21 U/L (ref 0–37)
Albumin: 4 g/dL (ref 3.5–5.2)
Alkaline Phosphatase: 64 U/L (ref 39–117)
BUN: 14 mg/dL (ref 6–23)
CO2: 33 mEq/L — ABNORMAL HIGH (ref 19–32)
Calcium: 9.2 mg/dL (ref 8.4–10.5)
Chloride: 101 mEq/L (ref 96–112)
Creatinine, Ser: 1.06 mg/dL (ref 0.40–1.50)
GFR: 67.82 mL/min (ref >60.00)
Glucose, Bld: 100 mg/dL — ABNORMAL HIGH (ref 70–99)
Potassium: 4.9 mEq/L (ref 3.5–5.1)
Sodium: 138 mEq/L (ref 135–145)
Total Bilirubin: 0.6 mg/dL (ref 0.2–1.2)
Total Protein: 6.9 g/dL (ref 6.0–8.3)

## 2021-11-16 LAB — CBC WITH DIFFERENTIAL/PLATELET
Basophils Absolute: 0.1 10*3/uL (ref 0.0–0.1)
Basophils Relative: 0.8 % (ref 0.0–3.0)
Eosinophils Absolute: 0.3 10*3/uL (ref 0.0–0.7)
Eosinophils Relative: 2.1 % (ref 0.0–5.0)
HCT: 48.6 % (ref 39.0–52.0)
Hemoglobin: 16.2 g/dL (ref 13.0–17.0)
Lymphocytes Relative: 10 % — ABNORMAL LOW (ref 12.0–46.0)
Lymphs Abs: 1.4 10*3/uL (ref 0.7–4.0)
MCHC: 33.4 g/dL (ref 30.0–36.0)
MCV: 90.5 fl (ref 78.0–100.0)
Monocytes Absolute: 0.8 10*3/uL (ref 0.1–1.0)
Monocytes Relative: 5.5 % (ref 3.0–12.0)
Neutro Abs: 11.1 10*3/uL — ABNORMAL HIGH (ref 1.4–7.7)
Neutrophils Relative %: 81.6 % — ABNORMAL HIGH (ref 43.0–77.0)
Platelets: 394 10*3/uL (ref 150.0–400.0)
RBC: 5.36 Mil/uL (ref 4.22–5.81)
RDW: 15.8 % — ABNORMAL HIGH (ref 11.5–15.5)
WBC: 13.7 10*3/uL — ABNORMAL HIGH (ref 4.0–10.5)

## 2021-11-16 LAB — PSA, MEDICARE: PSA: 3.45 ng/ml (ref 0.10–4.00)

## 2021-11-23 ENCOUNTER — Ambulatory Visit (INDEPENDENT_AMBULATORY_CARE_PROVIDER_SITE_OTHER)
Admission: RE | Admit: 2021-11-23 | Discharge: 2021-11-23 | Disposition: A | Discharge status: Home / Self Care | Payer: Medicare Other | Source: Ambulatory Visit | Attending: Family Medicine | Admitting: Family Medicine

## 2021-11-23 ENCOUNTER — Ambulatory Visit (INDEPENDENT_AMBULATORY_CARE_PROVIDER_SITE_OTHER): Payer: Medicare Other | Admitting: Family Medicine

## 2021-11-23 ENCOUNTER — Encounter: Payer: Self-pay | Admitting: Family Medicine

## 2021-11-23 VITALS — BP 154/84 | HR 68 | Temp 97.3°F | Ht 69.5 in | Wt 174.0 lb

## 2021-11-23 DIAGNOSIS — R519 Headache, unspecified: Secondary | ICD-10-CM | POA: Diagnosis not present

## 2021-11-23 DIAGNOSIS — Z0000 Encounter for general adult medical examination without abnormal findings: Secondary | ICD-10-CM

## 2021-11-23 DIAGNOSIS — I81 Portal vein thrombosis: Secondary | ICD-10-CM

## 2021-11-23 DIAGNOSIS — R911 Solitary pulmonary nodule: Secondary | ICD-10-CM

## 2021-11-23 DIAGNOSIS — R7989 Other specified abnormal findings of blood chemistry: Secondary | ICD-10-CM | POA: Diagnosis not present

## 2021-11-23 DIAGNOSIS — Z7189 Other specified counseling: Secondary | ICD-10-CM

## 2021-11-23 DIAGNOSIS — K219 Gastro-esophageal reflux disease without esophagitis: Secondary | ICD-10-CM

## 2021-11-23 DIAGNOSIS — K625 Hemorrhage of anus and rectum: Secondary | ICD-10-CM | POA: Diagnosis not present

## 2021-11-23 MED ORDER — ESOMEPRAZOLE MAGNESIUM 40 MG PO CPDR: 40.0000 mg | DELAYED_RELEASE_CAPSULE | Freq: Two times a day (BID) | ORAL | 12 refills | Status: DC

## 2021-11-23 NOTE — Patient Instructions (Addendum)
Nonfasting labs in about 2 weeks.  Please schedule on the way out.   ?Take care.  Glad to see you. ? ?Check with your insurance to see if they will cover the tetanus (Tdap) shot. ? ?Check your BP out of clinic and let me know if persistently above 140/90.   ? ?Take care.  Glad to see you. ? ?Try heat and ice on your neck.  Gently stretch.   ? ?Go to the lab on the way out.   If you have mychart we'll likely use that to update you.    ?

## 2021-11-23 NOTE — Progress Notes (Signed)
WBC elevated.  He was on abx recently for dental infection, in the mist of rx when labs were drawn.  He had aches but that resolved in the meantime.  Discussed that recent illness could have affected his lab results.  No FCNAVD. D/w pt about getting recheck CBC and path review in about 2 weeks.  See AVS.  He agrees.  ? ?Still anticoagulated.  He had noted occ blood in stool with stool normalization in the meantime.  Colonoscopy 2021, d/w pt. ?- Diverticulosis in the sigmoid colon. ?- Internal hemorrhoids. ?- The examination was otherwise ? ?No sx in the meantime.  D/w pt about monitoring for now.  He agrees.  No bleeding o/w.   ? ?History of GERD.  H/o esophageal narrowing but swallowing well.  D/w pt about continuing PPI.   ? ?Tetanus 2013 ?PNA up to date.  ?Flu 2022 ?Shingles done  ?covid encouraged ?Colonoscopy 2021 ?Advance directive- d/w pt. Son designated if patient were incapacitated.  ?Prev AAA screen done (per patient) and negative.  ?PSA wnl 2023 ? ?H/o lung nodule, not needing f/u.  ?Most recent imaging 09/2020.   ?1. Stable 4 mm subpleural nodule is noted anteriorly in the right ?middle lobe. This can be considered benign at this point with no ?further follow-up required. ?2. No other significant abnormality seen in the chest. ? ?D/w pt about history of portal vein thrombosis, followed up with hematology prev.  On Xarelto. No bleeding except for occasional bright red blood in stool as noted above. ? ?Occ occipital HA relieved with tylenol.  No recent pillow change.   ? ?Meds, vitals, and allergies reviewed.  ? ?ROS: Per HPI unless specifically indicated in ROS section  ? ?GEN: nad, alert and oriented ?HEENT: ncat, occiput not tender to palpation. ?NECK: supple w/o LA ?CV: rrr.  no murmur ?PULM: ctab, no inc wob ?ABD: soft, +bs ?EXT: no edema ?SKIN: no acute rash ?

## 2021-11-24 DIAGNOSIS — K625 Hemorrhage of anus and rectum: Secondary | ICD-10-CM | POA: Insufficient documentation

## 2021-11-24 DIAGNOSIS — R7989 Other specified abnormal findings of blood chemistry: Secondary | ICD-10-CM | POA: Insufficient documentation

## 2021-11-24 DIAGNOSIS — R519 Headache, unspecified: Secondary | ICD-10-CM | POA: Insufficient documentation

## 2021-11-24 NOTE — Assessment & Plan Note (Signed)
Continue Nexium 

## 2021-11-24 NOTE — Assessment & Plan Note (Signed)
No sx in the meantime.  D/w pt about monitoring for now.  He agrees.  No bleeding o/w.   ?

## 2021-11-24 NOTE — Assessment & Plan Note (Signed)
Discussed no follow-up needed. ?

## 2021-11-24 NOTE — Assessment & Plan Note (Signed)
D/w pt about getting recheck CBC and path review in about 2 weeks.  See AVS.  He agrees.  ?

## 2021-11-24 NOTE — Assessment & Plan Note (Signed)
Advance directive- d/w pt. Son designated if patient were incapacitated.  ?

## 2021-11-24 NOTE — Assessment & Plan Note (Signed)
Tetanus 2013 ?PNA up to date.  ?Flu 2022 ?Shingles done  ?covid encouraged ?Colonoscopy 2021 ?Advance directive- d/w pt. Son designated if patient were incapacitated.  ?Prev AAA screen done (per patient) and negative.  ?PSA wnl 2023 ?

## 2021-11-24 NOTE — Assessment & Plan Note (Signed)
Without associated neurologic symptoms. ?Reasonable to try heat and ice on the area.  Gently stretch.  Can update me as needed. ?

## 2021-11-24 NOTE — Assessment & Plan Note (Signed)
No abdominal pain.  Would continue Xarelto. No bleeding except for occasional bright red blood in stool as noted above. ?

## 2021-12-09 ENCOUNTER — Other Ambulatory Visit (INDEPENDENT_AMBULATORY_CARE_PROVIDER_SITE_OTHER): Payer: Medicare Other

## 2021-12-09 DIAGNOSIS — R7989 Other specified abnormal findings of blood chemistry: Secondary | ICD-10-CM | POA: Diagnosis not present

## 2021-12-09 LAB — CBC WITH DIFFERENTIAL/PLATELET
Basophils Absolute: 0.1 10*3/uL (ref 0.0–0.1)
Basophils Relative: 1.1 % (ref 0.0–3.0)
Eosinophils Absolute: 0.3 10*3/uL (ref 0.0–0.7)
Eosinophils Relative: 2.3 % (ref 0.0–5.0)
HCT: 48.4 % (ref 39.0–52.0)
Hemoglobin: 16.3 g/dL (ref 13.0–17.0)
Lymphocytes Relative: 15.7 % (ref 12.0–46.0)
Lymphs Abs: 1.7 10*3/uL (ref 0.7–4.0)
MCHC: 33.8 g/dL (ref 30.0–36.0)
MCV: 90.4 fl (ref 78.0–100.0)
Monocytes Absolute: 0.7 10*3/uL (ref 0.1–1.0)
Monocytes Relative: 6.3 % (ref 3.0–12.0)
Neutro Abs: 8 10*3/uL — ABNORMAL HIGH (ref 1.4–7.7)
Neutrophils Relative %: 74.6 % (ref 43.0–77.0)
Platelets: 402 10*3/uL — ABNORMAL HIGH (ref 150.0–400.0)
RBC: 5.35 Mil/uL (ref 4.22–5.81)
RDW: 16.3 % — ABNORMAL HIGH (ref 11.5–15.5)
WBC: 10.7 10*3/uL — ABNORMAL HIGH (ref 4.0–10.5)

## 2021-12-10 LAB — PATHOLOGIST SMEAR REVIEW

## 2021-12-11 ENCOUNTER — Encounter: Payer: Self-pay | Admitting: Family Medicine

## 2021-12-15 ENCOUNTER — Encounter: Payer: Self-pay | Admitting: Family Medicine

## 2021-12-17 ENCOUNTER — Telehealth: Payer: Self-pay | Admitting: Family Medicine

## 2021-12-17 NOTE — Telephone Encounter (Signed)
Called and spoke with patient and advised patient that we can not give Tdap or shingles vaccines to medicare patients in office. They are not covered here. I have already spoken to billing department about this as well last week. Patient is going to see if he can have done with the [place]. ?

## 2021-12-17 NOTE — Telephone Encounter (Signed)
Pt is asking if he can receive a Tdap shot here at the office. Pt states he went to [facility] and they said they cannot give it to him cause insurance will not cover it. Wants to speak with the nurse.  ? ?Callback Number: [phone]  ?

## 2022-01-02 ENCOUNTER — Other Ambulatory Visit: Payer: Self-pay | Admitting: Family Medicine

## 2022-03-21 ENCOUNTER — Telehealth: Payer: Self-pay

## 2022-03-21 NOTE — Telephone Encounter (Signed)
Noted. Thanks.

## 2022-03-21 NOTE — Telephone Encounter (Signed)
Patient called in stating that he has been having some pain over the last week with his R foot, but noticed an increase in swelling. Woke up today and noticed that it was entire foot was numb and was traveling up his shin. Had patient speak with triage.

## 2022-03-21 NOTE — Telephone Encounter (Signed)
Spoke to patient by telephone and was advised that he went to an Urgent Care in [facility]. Patient stated that they think he is having a problem with a nerve in his back. Patient stated that they gave him Prednisone and Tizanidine.Patient stated that they told him the pulse in his foot was fine. Patient was advised if he does not improve with the medication to call the office back and schedule an appointment with Dr. Damita Dunnings.

## 2022-03-21 NOTE — Telephone Encounter (Signed)
Please watch for access nurse note

## 2022-03-21 NOTE — Telephone Encounter (Signed)
PLEASE NOTE: All timestamps contained within this report are represented as [place] Standard Time. CONFIDENTIALTY NOTICE: This fax transmission is intended only for the addressee. It contains information that is legally privileged, confidential or otherwise protected from use or disclosure. If you are not the intended recipient, you are strictly prohibited from reviewing, disclosing, copying using or disseminating any of this information or taking any action in reliance on or regarding this information. If you have received this fax in error, please notify us immediately by telephone so that we can arrange for its return to [place]. Phone: [phone], Toll-Free: [phone], Fax: [phone] Page: 1 of 2 Call Id: [id number] [facility] Day - Client TELEPHONE ADVICE RECORD AccessNurse Patient Name: Mason [name] [facility] Gender: Male DOB: [birth year] Age: 76 Y 7 M 1 D Return Phone Number: [phone] (Primary) Address: [place]  [address] Client [facility] Day - Client Client Site [facility] - Day Provider Raechel Ache - MD Contact Type Call Who Is Calling Patient / Member / Family / Caregiver Call Type Triage / Clinical Relationship To Patient Self Return Phone Number [phone] (Primary) Chief Complaint Leg Pain Reason for Call Symptomatic / Request for Health Information Initial Comment Caller states foot is in pain, swelling, puffy, and numb. Can bare weight but unable to feel anything and traveling to shin. GOTO Facility Not Listed Urgent Care in [facility] Translation No Nurse Assessment Nurse: D'Heur Ezzard Standing, RN, Adrienne Date/Time (Eastern Time): 03/21/2022 8:31:07 AM Confirm and document reason for call. If symptomatic, describe symptoms. ---Caller states his right foot is in "pain", swelling, puffy, and numb on the front part of the ankle. He can bear weight but unable to feel  anything on the front part of the ankle up into the shin. The swelling started yesterday. The sensations started off & on for the past couple of weeks. Does the patient have any new or worsening symptoms? ---Yes Will a triage be completed? ---Yes Related visit to physician within the last 2 weeks? ---No Does the PT have any chronic conditions? (i.e. diabetes, asthma, this includes High risk factors for pregnancy, etc.) ---Yes List chronic conditions. ---A-fib, glaucoma, acid reflux Is this a behavioral health or substance abuse call? ---No Guidelines Guideline Title Affirmed Question Affirmed Notes Nurse Date/Time Lamount Cohen Time) Leg Swelling and Edema [1] Thigh, calf, or ankle swelling AND [2] only 1 side D'Heur Ezzard Standing, RN, Hansel Starling 03/21/2022 8:36:42 AM PLEASE NOTE: All timestamps contained within this report are represented as [place] Standard Time. CONFIDENTIALTY NOTICE: This fax transmission is intended only for the addressee. It contains information that is legally privileged, confidential or otherwise protected from use or disclosure. If you are not the intended recipient, you are strictly prohibited from reviewing, disclosing, copying using or disseminating any of this information or taking any action in reliance on or regarding this information. If you have received this fax in error, please notify us immediately by telephone so that we can arrange for its return to [place]. Phone: [phone], Toll-Free: [phone], Fax: [phone] Page: 2 of 2 Call Id: [id number] Disp. Time Lamount Cohen Time) Disposition Final User 03/21/2022 8:40:49 AM See HCP within 4 Hours (or PCP triage) Yes D'Heur Ezzard Standing, RN, Adrienne Final Disposition 03/21/2022 8:40:49 AM See HCP within 4 Hours (or PCP triage) Yes D'Heur Ezzard Standing, RN, Maple Hudson Disagree/Comply Comply Caller Understands Yes PreDisposition Call Doctor Care Advice Given Per Guideline SEE HCP (OR PCP TRIAGE) WITHIN 4 HOURS: * IF  OFFICE WILL BE OPEN: You need to be seen within the next 3 or 4 hours. Call your doctor (or NP/PA) now or as soon as the office opens. CALL EMS IF: * Chest pain or shortness of breath occurs. CARE ADVICE given per Leg Swelling and Edema (Adult) guideline. Referrals GO TO FACILITY OTHER - SPECIFY

## 2022-05-03 ENCOUNTER — Ambulatory Visit (INDEPENDENT_AMBULATORY_CARE_PROVIDER_SITE_OTHER): Payer: Medicare Other | Admitting: Nurse Practitioner

## 2022-05-03 VITALS — BP 144/92 | HR 78 | Temp 96.8°F | Resp 16 | Ht 69.5 in | Wt 175.0 lb

## 2022-05-03 DIAGNOSIS — R209 Unspecified disturbances of skin sensation: Secondary | ICD-10-CM | POA: Insufficient documentation

## 2022-05-03 LAB — BASIC METABOLIC PANEL
BUN: 21 mg/dL (ref 6–23)
CO2: 29 mEq/L (ref 19–32)
Calcium: 8.9 mg/dL (ref 8.4–10.5)
Chloride: 101 mEq/L (ref 96–112)
Creatinine, Ser: 1.1 mg/dL (ref 0.40–1.50)
GFR: 64.66 mL/min (ref >60.00)
Glucose, Bld: 86 mg/dL (ref 70–99)
Potassium: 4 mEq/L (ref 3.5–5.1)
Sodium: 136 mEq/L (ref 135–145)

## 2022-05-03 LAB — CBC
HCT: 48.8 % (ref 39.0–52.0)
Hemoglobin: 16.3 g/dL (ref 13.0–17.0)
MCHC: 33.3 g/dL (ref 30.0–36.0)
MCV: 91.8 fl (ref 78.0–100.0)
Platelets: 379 10*3/uL (ref 150.0–400.0)
RBC: 5.32 Mil/uL (ref 4.22–5.81)
RDW: 16.6 % — ABNORMAL HIGH (ref 11.5–15.5)
WBC: 10.6 10*3/uL — ABNORMAL HIGH (ref 4.0–10.5)

## 2022-05-03 NOTE — Assessment & Plan Note (Signed)
Patient's been seen in urgent care earlier this year was prescribed steroids and muscle relaxant without good relief.  No recent known injury per patient report.  Circulation intact.  Will refer to neurology for further work-up and possible nerve study test.

## 2022-05-03 NOTE — Patient Instructions (Signed)
Nice to see you today I will be in touch with the labs once I have them  Follow up if no improvement   I have referred you to neurology, if you do not hear from them in 2 weeks let me know

## 2022-05-03 NOTE — Progress Notes (Signed)
Acute Office Visit  Subjective:     Patient ID: Mason [name], male    DOB: 1946-02-09, 76 y.o.   MRN: [id number]  Chief Complaint  Patient presents with   Numbness    In right foot numbness and achy ness. Had issues with this in July and went to Urgent Care and was given prednisone and muscle relaxer to possible nerve irritation, now seems to have an issue when he drives, the top of his foot goes numb and barely had sensation to the touch. He thinks this is related to the injury about a year ago when he stepped off the porch with flip flop on and hit right foot turned side ways/wrong way and had pain for about 2 days after.    HPI Patient is in today for Numbness  Right foot present since approx July or a little beofre. States that he would get a crampying numb sensation to the 2nd and 3rd toe. States that he went to UC and was dx with pinched nerve and was treated with a muscle relaxer and steroid with no relief   States approx 1 year ago he twisted the right foot and hurtn for a couple days and got better.   States that it is described as dead meat.  States that if he drives any length of time he goes numbs.  Review of Systems  Constitutional:  Negative for chills and fever.  Neurological:  Positive for tingling. Negative for weakness.        Objective:    BP (!) 144/92   Pulse 78   Temp (!) 96.8 F (36 C)   Resp 16   Ht 5' 9.5" (1.765 m)   Wt 175 lb (79.4 kg)   SpO2 99%   BMI 25.47 kg/m    Physical Exam Vitals and nursing note reviewed.  Constitutional:      Appearance: Normal appearance.  Cardiovascular:     Rate and Rhythm: Normal rate and regular rhythm.     Pulses: Normal pulses.     Heart sounds: Normal heart sounds.     Comments: Right DP pulse slightly worse than left DP pulse.  Bilateral PT pulses 2+ patient has good cap refill bilaterally Pulmonary:     Effort: Pulmonary effort is normal.     Breath sounds: Normal breath sounds.   Musculoskeletal:     Right lower leg: No edema.     Left lower leg: No edema.       Feet:  Feet:     Comments: Area of altered sensation. Skin:    Capillary Refill: Capillary refill takes less than 2 seconds.  Neurological:     General: No focal deficit present.     Mental Status: He is alert.     Deep Tendon Reflexes:     Reflex Scores:      Patellar reflexes are 2+ on the right side and 2+ on the left side.    Comments: Bilateral lower extremity strength 5/5     No results found for any visits on 05/03/22.      Assessment & Plan:   Problem List Items Addressed This Visit       Other   Altered sensation of foot - Primary    Patient's been seen in urgent care earlier this year was prescribed steroids and muscle relaxant without good relief.  No recent known injury per patient report.  Circulation intact.  Will refer to neurology for further work-up and possible  nerve study test.      Relevant Orders   CBC   Basic metabolic panel   Ambulatory referral to Neurology    No orders of the defined types were placed in this encounter.   Return if symptoms worsen or fail to improve.  Audria Nine, NP

## 2022-05-05 ENCOUNTER — Ambulatory Visit (INDEPENDENT_AMBULATORY_CARE_PROVIDER_SITE_OTHER): Payer: Medicare Other | Admitting: Dermatology

## 2022-05-05 ENCOUNTER — Encounter: Payer: Self-pay | Admitting: Dermatology

## 2022-05-05 DIAGNOSIS — D229 Melanocytic nevi, unspecified: Secondary | ICD-10-CM

## 2022-05-05 DIAGNOSIS — D492 Neoplasm of unspecified behavior of bone, soft tissue, and skin: Secondary | ICD-10-CM | POA: Diagnosis not present

## 2022-05-05 DIAGNOSIS — L720 Epidermal cyst: Secondary | ICD-10-CM | POA: Diagnosis not present

## 2022-05-05 DIAGNOSIS — L578 Other skin changes due to chronic exposure to nonionizing radiation: Secondary | ICD-10-CM

## 2022-05-05 DIAGNOSIS — Z1283 Encounter for screening for malignant neoplasm of skin: Secondary | ICD-10-CM | POA: Diagnosis not present

## 2022-05-05 DIAGNOSIS — L821 Other seborrheic keratosis: Secondary | ICD-10-CM

## 2022-05-05 DIAGNOSIS — R21 Rash and other nonspecific skin eruption: Secondary | ICD-10-CM | POA: Diagnosis not present

## 2022-05-05 DIAGNOSIS — Z85828 Personal history of other malignant neoplasm of skin: Secondary | ICD-10-CM

## 2022-05-05 DIAGNOSIS — D1800 Hemangioma unspecified site: Secondary | ICD-10-CM

## 2022-05-05 DIAGNOSIS — L814 Other melanin hyperpigmentation: Secondary | ICD-10-CM

## 2022-05-05 MED ORDER — MOMETASONE FUROATE 0.1 % EX CREA: TOPICAL_CREAM | CUTANEOUS | 2 refills | Status: DC

## 2022-05-05 NOTE — Patient Instructions (Signed)
Due to recent changes in healthcare laws, you may see results of your pathology and/or laboratory studies on MyChart before the doctors have had a chance to review them. We understand that in some cases there may be results that are confusing or concerning to you. Please understand that not all results are received at the same time and often the doctors may need to interpret multiple results in order to provide you with the best plan of care or course of treatment. Therefore, we ask that you please give us 2 business days to thoroughly review all your results before contacting the office for clarification. Should we see a critical lab result, you will be contacted sooner.   If You Need Anything After Your Visit  If you have any questions or concerns for your doctor, please call our main line at [phone] and press option 4 to reach your doctor's medical assistant. If no one answers, please leave a voicemail as directed and we will return your call as soon as possible. Messages left after 4 pm will be answered the following business day.   You may also send us a message via MyChart. We typically respond to MyChart messages within 1-2 business days.  For prescription refills, please ask your pharmacy to contact our office. Our fax number is [phone].  If you have an urgent issue when the clinic is closed that cannot wait until the next business day, you can page your doctor at the number below.    Please note that while we do our best to be available for urgent issues outside of office hours, we are not available 24/7.   If you have an urgent issue and are unable to reach us, you may choose to seek medical care at your doctor's office, retail clinic, urgent care center, or emergency room.  If you have a medical emergency, please immediately call 911 or go to the emergency department.  Pager Numbers  - Dr. Kowalski: [phone]  - Dr. Moye: [phone]  - Dr. Stewart:  [phone]  In the event of inclement weather, please call our main line at [phone] for an update on the status of any delays or closures.  Dermatology Medication Tips: Please keep the boxes that topical medications come in in order to help keep track of the instructions about where and how to use these. Pharmacies typically print the medication instructions only on the boxes and not directly on the medication tubes.   If your medication is too expensive, please contact our office at [phone] option 4 or send us a message through MyChart.   We are unable to tell what your co-pay for medications will be in advance as this is different depending on your insurance coverage. However, we may be able to find a substitute medication at lower cost or fill out paperwork to get insurance to cover a needed medication.   If a prior authorization is required to get your medication covered by your insurance company, please allow us 1-2 business days to complete this process.  Drug prices often vary depending on where the prescription is filled and some pharmacies may offer cheaper prices.  The website www.goodrx.com contains coupons for medications through different pharmacies. The prices here do not account for what the cost may be with help from insurance (it may be cheaper with your insurance), but the website can give you the price if you did not use any insurance.  - You can print the associated coupon and take it with   your prescription to the pharmacy.  - You may also stop by our office during regular business hours and pick up a GoodRx coupon card.  - If you need your prescription sent electronically to a different pharmacy, notify our office through [place] MyChart or by phone at [phone] option 4.     Si Usted Necesita Algo Despus de Su Visita  Tambin puede enviarnos un mensaje a travs de MyChart. Por lo general respondemos a los mensajes de MyChart en el transcurso de 1 a 2  das hbiles.  Para renovar recetas, por favor pida a su farmacia que se ponga en contacto con nuestra oficina. Nuestro nmero de fax es el [phone].  Si tiene un asunto urgente cuando la clnica est cerrada y que no puede esperar hasta el siguiente da hbil, puede llamar/localizar a su doctor(a) al nmero que aparece a continuacin.   Por favor, tenga en cuenta que aunque hacemos todo lo posible para estar disponibles para asuntos urgentes fuera del horario de oficina, no estamos disponibles las 24 horas del da, los 7 das de la semana.   Si tiene un problema urgente y no puede comunicarse con nosotros, puede optar por buscar atencin mdica  en el consultorio de su doctor(a), en una clnica privada, en un centro de atencin urgente o en una sala de emergencias.  Si tiene una emergencia mdica, por favor llame inmediatamente al 911 o vaya a la sala de emergencias.  Nmeros de bper  - Dr. Kowalski: [phone]  - Dra. Moye: [phone]  - Dra. Stewart: [phone]  En caso de inclemencias del tiempo, por favor llame a nuestra lnea principal al [phone] para una actualizacin sobre el estado de cualquier retraso o cierre.  Consejos para la medicacin en dermatologa: Por favor, guarde las cajas en las que vienen los medicamentos de uso tpico para ayudarle a seguir las instrucciones sobre dnde y cmo usarlos. Las farmacias generalmente imprimen las instrucciones del medicamento slo en las cajas y no directamente en los tubos del medicamento.   Si su medicamento es muy caro, por favor, pngase en contacto con nuestra oficina llamando al [phone] y presione la opcin 4 o envenos un mensaje a travs de MyChart.   No podemos decirle cul ser su copago por los medicamentos por adelantado ya que esto es diferente dependiendo de la cobertura de su seguro. Sin embargo, es posible que podamos encontrar un medicamento sustituto a menor costo o llenar un formulario para que el  seguro cubra el medicamento que se considera necesario.   Si se requiere una autorizacin previa para que su compaa de seguros cubra su medicamento, por favor permtanos de 1 a 2 das hbiles para completar este proceso.  Los precios de los medicamentos varan con frecuencia dependiendo del lugar de dnde se surte la receta y alguna farmacias pueden ofrecer precios ms baratos.  El sitio web www.goodrx.com tiene cupones para medicamentos de diferentes farmacias. Los precios aqu no tienen en cuenta lo que podra costar con la ayuda del seguro (puede ser ms barato con su seguro), pero el sitio web puede darle el precio si no utiliz ningn seguro.  - Puede imprimir el cupn correspondiente y llevarlo con su receta a la farmacia.  - Tambin puede pasar por nuestra oficina durante el horario de atencin regular y recoger una tarjeta de cupones de GoodRx.  - Si necesita que su receta se enve electrnicamente a una farmacia diferente, informe a nuestra oficina a travs de MyChart de [place]   o por telfono llamando al [phone] y presione la opcin 4.  

## 2022-05-05 NOTE — Progress Notes (Unsigned)
Follow-Up Visit   Subjective  Mason [name] is a 76 y.o. male who presents for the following: Annual Exam (Mole check). Hx of BCC. The patient presents for Total-Body Skin Exam (TBSE) for skin cancer screening and mole check.  The patient has spots, moles and lesions to be evaluated, some may be new or changing and the patient has concerns that these could be cancer.   The following portions of the chart were reviewed this encounter and updated as appropriate:   Tobacco  Allergies  Meds  Problems  Med Hx  Surg Hx  Fam Hx     Review of Systems:  No other skin or systemic complaints except as noted in HPI or Assessment and Plan.  Objective  Well appearing patient in no apparent distress; mood and affect are within normal limits.  A full examination was performed including scalp, head, eyes, ears, nose, lips, neck, chest, axillae, abdomen, back, buttocks, bilateral upper extremities, bilateral lower extremities, hands, feet, fingers, toes, fingernails, and toenails. All findings within normal limits unless otherwise noted below.  nose Small smooth white papule  posterior neck Scaly erythematous papules and patches  left of midline nasal tip ~ 1.0 cm Indention         Assessment & Plan  Milia nose Benign-appearing.  Observation.  Call clinic for new or changing lesions.  Recommend daily use of broad spectrum spf 30+ sunscreen to sun-exposed areas.   Rash posterior neck Contact dermatitis vs Eczema  Start Mometasone cream apply to affected skin qd-bid prn  Related Medications mometasone (ELOCON) 0.1 % cream Apply to affected skin on the neck qd-bid prn irritation  Neoplasm of skin left of midline nasal tip See photo Recheck in 6-8 week we will recheck photos- may consider biopsy   Lentigines - Scattered tan macules - Due to sun exposure - Benign-appearing, observe - Recommend daily broad spectrum sunscreen SPF 30+ to sun-exposed areas, reapply every 2  hours as needed. - Call for any changes  Seborrheic Keratoses - Stuck-on, waxy, tan-brown papules and/or plaques  - Benign-appearing - Discussed benign etiology and prognosis. - Observe - Call for any changes  Melanocytic Nevi - Tan-brown and/or pink-flesh-colored symmetric macules and papules - Benign appearing on exam today - Observation - Call clinic for new or changing moles - Recommend daily use of broad spectrum spf 30+ sunscreen to sun-exposed areas.   Hemangiomas - Red papules - Discussed benign nature - Observe - Call for any changes  Actinic Damage - Chronic condition, secondary to cumulative UV/sun exposure - diffuse scaly erythematous macules with underlying dyspigmentation - Recommend daily broad spectrum sunscreen SPF 30+ to sun-exposed areas, reapply every 2 hours as needed.  - Staying in the shade or wearing long sleeves, sun glasses (UVA+UVB protection) and wide brim hats (4-inch brim around the entire circumference of the hat) are also recommended for sun protection.  - Call for new or changing lesions.  History of Basal Cell Carcinoma of the Skin Back- several years ago  Left supratip 2010 - No evidence of recurrence today - Recommend regular full body skin exams - Recommend daily broad spectrum sunscreen SPF 30+ to sun-exposed areas, reapply every 2 hours as needed.  - Call if any new or changing lesions are noted between office visits   Skin cancer screening performed today.   Return in about 6 weeks (around 06/16/2022) for recheck a spot on the nose, TBSE in 1 year hx of BCC .  IMarye Round, CMA,  am acting as scribe for Sarina Ser, MD .  Documentation: I have reviewed the above documentation for accuracy and completeness, and I agree with the above.  Sarina Ser, MD

## 2022-05-08 ENCOUNTER — Encounter: Payer: Self-pay | Admitting: Dermatology

## 2022-05-09 ENCOUNTER — Other Ambulatory Visit: Payer: Self-pay

## 2022-05-09 MED ORDER — RIVAROXABAN 20 MG PO TABS: 20.0000 mg | ORAL_TABLET | Freq: Every day | ORAL | 1 refills | Status: DC

## 2022-05-09 NOTE — Telephone Encounter (Signed)
Prescription refill request for Xarelto received.  Indication:Afib Last office visit:2/23 Weight:79.4 kg Age:76 Scr:1.1 CrCl:63.16 ml/min  Prescription refilled

## 2022-06-16 ENCOUNTER — Ambulatory Visit (INDEPENDENT_AMBULATORY_CARE_PROVIDER_SITE_OTHER): Payer: Medicare Other | Admitting: Dermatology

## 2022-06-16 DIAGNOSIS — L578 Other skin changes due to chronic exposure to nonionizing radiation: Secondary | ICD-10-CM | POA: Diagnosis not present

## 2022-06-16 DIAGNOSIS — Z85828 Personal history of other malignant neoplasm of skin: Secondary | ICD-10-CM | POA: Diagnosis not present

## 2022-06-16 DIAGNOSIS — D492 Neoplasm of unspecified behavior of bone, soft tissue, and skin: Secondary | ICD-10-CM

## 2022-06-16 NOTE — Patient Instructions (Signed)
Due to recent changes in healthcare laws, you may see results of your pathology and/or laboratory studies on MyChart before the doctors have had a chance to review them. We understand that in some cases there may be results that are confusing or concerning to you. Please understand that not all results are received at the same time and often the doctors may need to interpret multiple results in order to provide you with the best plan of care or course of treatment. Therefore, we ask that you please give us 2 business days to thoroughly review all your results before contacting the office for clarification. Should we see a critical lab result, you will be contacted sooner.   If You Need Anything After Your Visit  If you have any questions or concerns for your doctor, please call our main line at [phone] and press option 4 to reach your doctor's medical assistant. If no one answers, please leave a voicemail as directed and we will return your call as soon as possible. Messages left after 4 pm will be answered the following business day.   You may also send us a message via MyChart. We typically respond to MyChart messages within 1-2 business days.  For prescription refills, please ask your pharmacy to contact our office. Our fax number is [phone].  If you have an urgent issue when the clinic is closed that cannot wait until the next business day, you can page your doctor at the number below.    Please note that while we do our best to be available for urgent issues outside of office hours, we are not available 24/7.   If you have an urgent issue and are unable to reach us, you may choose to seek medical care at your doctor's office, retail clinic, urgent care center, or emergency room.  If you have a medical emergency, please immediately call 911 or go to the emergency department.  Pager Numbers  - Dr. Kowalski: [phone]  - Dr. Moye: [phone]  - Dr. Stewart:  [phone]  In the event of inclement weather, please call our main line at [phone] for an update on the status of any delays or closures.  Dermatology Medication Tips: Please keep the boxes that topical medications come in in order to help keep track of the instructions about where and how to use these. Pharmacies typically print the medication instructions only on the boxes and not directly on the medication tubes.   If your medication is too expensive, please contact our office at [phone] option 4 or send us a message through MyChart.   We are unable to tell what your co-pay for medications will be in advance as this is different depending on your insurance coverage. However, we may be able to find a substitute medication at lower cost or fill out paperwork to get insurance to cover a needed medication.   If a prior authorization is required to get your medication covered by your insurance company, please allow us 1-2 business days to complete this process.  Drug prices often vary depending on where the prescription is filled and some pharmacies may offer cheaper prices.  The website www.goodrx.com contains coupons for medications through different pharmacies. The prices here do not account for what the cost may be with help from insurance (it may be cheaper with your insurance), but the website can give you the price if you did not use any insurance.  - You can print the associated coupon and take it with   your prescription to the pharmacy.  - You may also stop by our office during regular business hours and pick up a GoodRx coupon card.  - If you need your prescription sent electronically to a different pharmacy, notify our office through [facility] MyChart or by phone at [phone] option 4.     Si Usted Necesita Algo Despus de Su Visita  Tambin puede enviarnos un mensaje a travs de MyChart. Por lo general respondemos a los mensajes de MyChart en el transcurso de 1 a 2  das hbiles.  Para renovar recetas, por favor pida a su farmacia que se ponga en contacto con nuestra oficina. Nuestro nmero de fax es el [phone].  Si tiene un asunto urgente cuando la clnica est cerrada y que no puede esperar hasta el siguiente da hbil, puede llamar/localizar a su doctor(a) al nmero que aparece a continuacin.   Por favor, tenga en cuenta que aunque hacemos todo lo posible para estar disponibles para asuntos urgentes fuera del horario de oficina, no estamos disponibles las 24 horas del da, los 7 das de la semana.   Si tiene un problema urgente y no puede comunicarse con nosotros, puede optar por buscar atencin mdica  en el consultorio de su doctor(a), en una clnica privada, en un centro de atencin urgente o en una sala de emergencias.  Si tiene una emergencia mdica, por favor llame inmediatamente al 911 o vaya a la sala de emergencias.  Nmeros de bper  - Dr. Kowalski: [phone]  - Dra. Moye: [phone]  - Dra. Stewart: [phone]  En caso de inclemencias del tiempo, por favor llame a nuestra lnea principal al [phone] para una actualizacin sobre el estado de cualquier retraso o cierre.  Consejos para la medicacin en dermatologa: Por favor, guarde las cajas en las que vienen los medicamentos de uso tpico para ayudarle a seguir las instrucciones sobre dnde y cmo usarlos. Las farmacias generalmente imprimen las instrucciones del medicamento slo en las cajas y no directamente en los tubos del medicamento.   Si su medicamento es muy caro, por favor, pngase en contacto con nuestra oficina llamando al [phone] y presione la opcin 4 o envenos un mensaje a travs de MyChart.   No podemos decirle cul ser su copago por los medicamentos por adelantado ya que esto es diferente dependiendo de la cobertura de su seguro. Sin embargo, es posible que podamos encontrar un medicamento sustituto a menor costo o llenar un formulario para que el  seguro cubra el medicamento que se considera necesario.   Si se requiere una autorizacin previa para que su compaa de seguros cubra su medicamento, por favor permtanos de 1 a 2 das hbiles para completar este proceso.  Los precios de los medicamentos varan con frecuencia dependiendo del lugar de dnde se surte la receta y alguna farmacias pueden ofrecer precios ms baratos.  El sitio web www.goodrx.com tiene cupones para medicamentos de diferentes farmacias. Los precios aqu no tienen en cuenta lo que podra costar con la ayuda del seguro (puede ser ms barato con su seguro), pero el sitio web puede darle el precio si no utiliz ningn seguro.  - Puede imprimir el cupn correspondiente y llevarlo con su receta a la farmacia.  - Tambin puede pasar por nuestra oficina durante el horario de atencin regular y recoger una tarjeta de cupones de GoodRx.  - Si necesita que su receta se enve electrnicamente a una farmacia diferente, informe a nuestra oficina a travs de MyChart de [facility]   o por telfono llamando al [phone] y presione la opcin 4.  

## 2022-06-16 NOTE — Progress Notes (Signed)
   Follow-Up Visit   Subjective  Mason [name] is a 76 y.o. male who presents for the following: Follow-up (4 weeks f/u on the  left of midline nasal tip ). Hx of BCC on the left supratip treated with Mohs surgery in 2010.   The following portions of the chart were reviewed this encounter and updated as appropriate:   Tobacco  Allergies  Meds  Problems  Med Hx  Surg Hx  Fam Hx     Review of Systems:  No other skin or systemic complaints except as noted in HPI or Assessment and Plan.  Objective  Well appearing patient in no apparent distress; mood and affect are within normal limits.  A focused examination was performed including face, ears. Relevant physical exam findings are noted in the Assessment and Plan.  nasal tip 0.6 cm indentation      left supratip Well healed scar with no evidence of recurrence.    Assessment & Plan  Neoplasm of skin -mild indentation of the nasal tip nasal tip See Photo Indentation appears better today than last visit compared to photos   No sign of skin cancer  Consider biopsy if any changes   History of basal cell carcinoma (BCC) left supratip The patient will observe these symptoms, and report promptly any worsening or unexpected persistence.  If well, may return prn.   Actinic Damage - chronic, secondary to cumulative UV radiation exposure/sun exposure over time - diffuse scaly erythematous macules with underlying dyspigmentation - Recommend daily broad spectrum sunscreen SPF 30+ to sun-exposed areas, reapply every 2 hours as needed.  - Recommend staying in the shade or wearing long sleeves, sun glasses (UVA+UVB protection) and wide brim hats (4-inch brim around the entire circumference of the hat). - Call for new or changing lesions.  Return in about 6 months (around 12/16/2022) for TBSE, hx of BCC, recheck nose .  IMarye Round, CMA, am acting as scribe for Sarina Ser, MD .  Documentation: I have reviewed the above  documentation for accuracy and completeness, and I agree with the above.  Sarina Ser, MD

## 2022-06-20 ENCOUNTER — Telehealth: Payer: Self-pay | Admitting: Family Medicine

## 2022-06-20 ENCOUNTER — Emergency Department (HOSPITAL_BASED_OUTPATIENT_CLINIC_OR_DEPARTMENT_OTHER): Payer: Medicare Other

## 2022-06-20 ENCOUNTER — Encounter (HOSPITAL_BASED_OUTPATIENT_CLINIC_OR_DEPARTMENT_OTHER): Payer: Self-pay

## 2022-06-20 ENCOUNTER — Encounter: Payer: Self-pay | Admitting: Dermatology

## 2022-06-20 ENCOUNTER — Emergency Department (HOSPITAL_BASED_OUTPATIENT_CLINIC_OR_DEPARTMENT_OTHER)
Admission: EM | Admit: 2022-06-20 | Discharge: 2022-06-20 | Disposition: A | Discharge status: Home / Self Care | Payer: Medicare Other | Attending: Emergency Medicine | Admitting: Emergency Medicine

## 2022-06-20 ENCOUNTER — Other Ambulatory Visit: Payer: Self-pay

## 2022-06-20 DIAGNOSIS — R1032 Left lower quadrant pain: Secondary | ICD-10-CM | POA: Diagnosis present

## 2022-06-20 DIAGNOSIS — D72829 Elevated white blood cell count, unspecified: Secondary | ICD-10-CM | POA: Diagnosis not present

## 2022-06-20 DIAGNOSIS — K5792 Diverticulitis of intestine, part unspecified, without perforation or abscess without bleeding: Secondary | ICD-10-CM

## 2022-06-20 DIAGNOSIS — Z7901 Long term (current) use of anticoagulants: Secondary | ICD-10-CM | POA: Diagnosis not present

## 2022-06-20 DIAGNOSIS — K5732 Diverticulitis of large intestine without perforation or abscess without bleeding: Secondary | ICD-10-CM | POA: Diagnosis not present

## 2022-06-20 LAB — URINALYSIS, ROUTINE W REFLEX MICROSCOPIC
Bilirubin Urine: NEGATIVE
Glucose, UA: NEGATIVE mg/dL
Hgb urine dipstick: NEGATIVE
Ketones, ur: NEGATIVE mg/dL
Leukocytes,Ua: NEGATIVE
Nitrite: NEGATIVE
Protein, ur: NEGATIVE mg/dL
Specific Gravity, Urine: 1.007 (ref 1.005–1.030)
pH: 6 (ref 5.0–8.0)

## 2022-06-20 LAB — COMPREHENSIVE METABOLIC PANEL
ALT: 15 U/L (ref 0–44)
AST: 20 U/L (ref 15–41)
Albumin: 4.1 g/dL (ref 3.5–5.0)
Alkaline Phosphatase: 60 U/L (ref 38–126)
Anion gap: 5 (ref 5–15)
BUN: 12 mg/dL (ref 8–23)
CO2: 27 mmol/L (ref 22–32)
Calcium: 9.1 mg/dL (ref 8.9–10.3)
Chloride: 105 mmol/L (ref 98–111)
Creatinine, Ser: 1.11 mg/dL (ref 0.61–1.24)
GFR, Estimated: >60 mL/min (ref >60)
Glucose, Bld: 97 mg/dL (ref 70–99)
Potassium: 4.4 mmol/L (ref 3.5–5.1)
Sodium: 137 mmol/L (ref 135–145)
Total Bilirubin: 0.4 mg/dL (ref 0.3–1.2)
Total Protein: 7.2 g/dL (ref 6.5–8.1)

## 2022-06-20 LAB — CBC
HCT: 48.5 % (ref 39.0–52.0)
Hemoglobin: 16.3 g/dL (ref 13.0–17.0)
MCH: 30.6 pg (ref 26.0–34.0)
MCHC: 33.6 g/dL (ref 30.0–36.0)
MCV: 91.2 fL (ref 80.0–100.0)
Platelets: 432 10*3/uL — ABNORMAL HIGH (ref 150–400)
RBC: 5.32 MIL/uL (ref 4.22–5.81)
RDW: 16.3 % — ABNORMAL HIGH (ref 11.5–15.5)
WBC: 14.3 10*3/uL — ABNORMAL HIGH (ref 4.0–10.5)
nRBC: 0 % (ref 0.0–0.2)

## 2022-06-20 LAB — LIPASE, BLOOD: Lipase: 29 U/L (ref 11–51)

## 2022-06-20 MED ORDER — FENTANYL CITRATE PF 50 MCG/ML IJ SOSY: 50.0000 ug | PREFILLED_SYRINGE | INTRAMUSCULAR | Status: DC | PRN

## 2022-06-20 MED ORDER — ONDANSETRON HCL 4 MG/2ML IJ SOLN: 4.0000 mg | Freq: Three times a day (TID) | INTRAMUSCULAR | Status: DC | PRN

## 2022-06-20 MED ORDER — LACTATED RINGERS IV BOLUS
1000.0000 mL | Freq: Once | INTRAVENOUS | Status: AC
  Administered 2022-06-20: 1000 mL via INTRAVENOUS

## 2022-06-20 MED ORDER — IOHEXOL 350 MG/ML SOLN
75.0000 mL | Freq: Once | INTRAVENOUS | Status: AC | PRN
  Administered 2022-06-20: 75 mL via INTRAVENOUS

## 2022-06-20 MED ORDER — METRONIDAZOLE 500 MG PO TABS: 500.0000 mg | ORAL_TABLET | Freq: Two times a day (BID) | ORAL | 0 refills | Status: DC

## 2022-06-20 MED ORDER — CIPROFLOXACIN HCL 500 MG PO TABS: 500.0000 mg | ORAL_TABLET | Freq: Two times a day (BID) | ORAL | 0 refills | Status: AC

## 2022-06-20 NOTE — ED Triage Notes (Signed)
Pt reports LLQ pain since yesterday. Pt states that he feels like it is a diverticulitis flare.

## 2022-06-20 NOTE — Telephone Encounter (Signed)
[name] - Client TELEPHONE ADVICE RECORD [facility] Patient Name: Mason [name] [facility] Gender: Male DOB: 03-06-1946 Age: 76 Y 10 M Return Phone Number: [phone] (Primary) Address: City/ State/ Zip: [place]  [address] Client [facility] - Client Client Site [facility] - Day Provider Renford Dills - MD Contact Type Call Who Is Calling Patient / Member / Family / Caregiver Call Type Triage / Clinical Relationship To Patient Self Return Phone Number [phone] (Primary) Chief Complaint Unclassified Symptom Reason for Call Symptomatic / Request for [facility] states he has pain on the left side of his colon. Translation No Nurse Assessment Nurse: Alford Highland, RN, Magda Paganini Date/Time Eilene Ghazi Time): 06/20/2022 1:55:43 PM Confirm and document reason for call. If symptomatic, describe symptoms. ---Caller states he has sharp abdominal pain on the left side. History of diverticulitis, about 4-5 episodes. Rates pain at 8-9 if moving. No fever or vomiting. No diarrhea, but a little softer than usual. No blood in stool. Pain started yesterday. Does the patient have any new or worsening symptoms? ---Yes Will a triage be completed? ---Yes Related visit to physician within the last 2 weeks? ---No Does the PT have any chronic conditions? (i.e. diabetes, asthma, this includes High risk factors for pregnancy, etc.) ---Yes List chronic conditions. ---hx of Diverticulitis Afib Is this a behavioral health or substance abuse call? ---No Guidelines Guideline Title Affirmed Question Affirmed Notes Nurse Date/Time Eilene Ghazi Time) Abdominal Pain - Male [1] SEVERE pain AND [2] age > 21 years Annabelle Harman 06/20/2022 1:58:34 PM Disp. Time Eilene Ghazi Time) Disposition Final User 06/20/2022 2:01:31 PM Go to ED Now Yes Alford Highland, RN, Magda Paganini PLEASE NOTE: All timestamps  contained within this report are represented as [place] Standard Time. CONFIDENTIALTY NOTICE: This fax transmission is intended only for the addressee. It contains information that is legally privileged, confidential or otherwise protected from use or disclosure. If you are not the intended recipient, you are strictly prohibited from reviewing, disclosing, copying using or disseminating any of this information or taking any action in reliance on or regarding this information. If you have received this fax in error, please notify us immediately by telephone so that we can arrange for its return to [place]. Phone: [phone], Toll-Free: [phone], Fax: [phone] Page: 2 of 2 Call Id: [id number] Final Disposition 06/20/2022 2:01:31 PM Go to ED Now Yes Alford Highland, RN, Christa See Disagree/Comply Comply Caller Understands Yes PreDisposition Go to Urgent Care/Walk-In Clinic Care Advice Given Per Guideline GO TO ED NOW: * You need to be seen in the Emergency Department. * Go to the ED at ___________ [place] now. Drive carefully. CARE ADVICE given per Abdominal Pain - Male (Adult) guideline. ANOTHER ADULT SHOULD DRIVE: * It is better and safer if another adult drives instead of you. NOTHING BY MOUTH: * Do not eat or drink anything for now. Referrals GO TO FACILITY UNDECIDE

## 2022-06-20 NOTE — Discharge Instructions (Addendum)
Your CT abdomen pelvis was consistent with acute diverticulitis that is uncomplicated.  We will trial a course of outpatient antibiotics.  Recommend you follow-up with your PCP as needed and/or the emergency department if you have worsening symptoms.  We will start you on ciprofloxacin and Flagyl.

## 2022-06-20 NOTE — ED Provider Notes (Signed)
[facility] EMERGENCY DEPT Provider Note   CSN: [id number] Arrival date & time: 06/20/22  1520     History  Chief Complaint  Patient presents with   Abdominal Pain    Mason [name] is a 76 y.o. male.   Abdominal Pain    76 year old male with medical history significant for diverticulitis, depression, anxiety, GERD, internal hemorrhoids, IBS who presents to the emergency department with left lower quadrant abdominal pain.  The patient states that he has a history of diverticulitis flares and feels like he is having another 1.  He endorses left lower quadrant pain that started yesterday.  He denies any nausea or vomiting.  No radiation of the pain.  He is passing gas as usual.  Having normal bowel movements with the most recent bowel movement at 1500 today.  He is tolerating oral intake with no vomiting.  No has any fevers or chills.  No dysuria or increased urinary frequency.  Home Medications Prior to Admission medications   Medication Sig Start Date End Date Taking? Authorizing Provider  ciprofloxacin (CIPRO) 500 MG tablet Take 1 tablet (500 mg total) by mouth every 12 (twelve) hours for 7 days. 06/20/22 06/27/22 Yes Regan Lemming, MD  metroNIDAZOLE (FLAGYL) 500 MG tablet Take 1 tablet (500 mg total) by mouth 2 (two) times daily. 06/20/22  Yes Regan Lemming, MD  b complex vitamins capsule Take 1 capsule by mouth daily.    [provider]  bimatoprost (LUMIGAN) 0.01 % SOLN Apply 1 drop to eye daily.    [provider]  Cholecalciferol (VITAMIN D) 125 MCG (5000 UT) CAPS Take 1 tablet by mouth daily.    [provider]  esomeprazole (NEXIUM) 40 MG capsule Take 1 capsule (40 mg total) by mouth 2 (two) times daily. 11/23/21   Tonia Ghent, MD  levocetirizine (XYZAL) 5 MG tablet TAKE 1 TABLET BY MOUTH EVERY DAY IN THE EVENING 01/03/22   Tonia Ghent, MD  mometasone (ELOCON) 0.1 % cream Apply to affected skin on the neck qd-bid prn  irritation 05/05/22   Ralene Bathe, MD  rivaroxaban (XARELTO) 20 MG TABS tablet Take 1 tablet (20 mg total) by mouth daily with supper. 05/09/22   Kate Sable, MD  tamsulosin (FLOMAX) 0.4 MG CAPS capsule TAKE 1-2 CAPSULES (0.4- 0.8 MG TOTAL) BY MOUTH DAILY. 09/21/21   Tonia Ghent, MD  timolol (TIMOPTIC-XR) 0.5 % ophthalmic gel-forming Place 1 drop into the left eye every morning. 07/15/19   [provider]  vitamin C (ASCORBIC ACID) 500 MG tablet Take 500 mg by mouth daily.    [provider]  zinc gluconate 50 MG tablet Take 50 mg by mouth daily.    [provider]      Allergies    Chlordiazepoxide-clidinium, Citalopram hydrobromide, Erythromycin base, Hyoscyamine sulfate, Meloxicam, Penicillins, and Shrimp [shellfish allergy]    Review of Systems   Review of Systems  Gastrointestinal:  Positive for abdominal pain.  All other systems reviewed and are negative.   Physical Exam Updated Vital Signs BP (!) 161/94   Pulse 71   Temp 98.1 F (36.7 C) (Oral)   Resp 20   Ht '5\' 9"'$  (1.753 m)   Wt 79.8 kg   SpO2 98%   BMI 25.99 kg/m  Physical Exam Vitals and nursing note reviewed.  Constitutional:      General: He is not in acute distress.    Appearance: He is well-developed.  HENT:     Head:  Normocephalic and atraumatic.  Eyes:     Conjunctiva/sclera: Conjunctivae normal.     Pupils: Pupils are equal, round, and reactive to light.  Cardiovascular:     Rate and Rhythm: Normal rate and regular rhythm.     Heart sounds: No murmur heard. Pulmonary:     Effort: Pulmonary effort is normal. No respiratory distress.     Breath sounds: Normal breath sounds.  Abdominal:     General: There is no distension.     Palpations: Abdomen is soft.     Tenderness: There is abdominal tenderness in the left lower quadrant. There is no guarding or rebound.  Musculoskeletal:        General: No swelling, deformity or signs of injury.     Cervical back: Neck  supple.  Skin:    General: Skin is warm and dry.     Capillary Refill: Capillary refill takes less than 2 seconds.     Findings: No lesion or rash.  Neurological:     General: No focal deficit present.     Mental Status: He is alert. Mental status is at baseline.  Psychiatric:        Mood and Affect: Mood normal.     ED Results / Procedures / Treatments   Labs (all labs ordered are listed, but only abnormal results are displayed) Labs Reviewed  CBC - Abnormal; Notable for the following components:      Result Value   WBC 14.3 (*)    RDW 16.3 (*)    Platelets 432 (*)    All other components within normal limits  LIPASE, BLOOD  COMPREHENSIVE METABOLIC PANEL  URINALYSIS, ROUTINE W REFLEX MICROSCOPIC    EKG EKG Interpretation  Date/Time:  Monday June 20 2022 17:31:31 EDT Ventricular Rate:  61 PR Interval:  181 QRS Duration: 88 QT Interval:  408 QTC Calculation: 411 R Axis:   -21 Text Interpretation: Sinus rhythm Borderline left axis deviation Confirmed by Regan Lemming ([id number]) on 06/20/2022 5:55:37 PM  Radiology CT ABDOMEN PELVIS W CONTRAST  Result Date: 06/20/2022 CLINICAL DATA:  Left lower quadrant pain EXAM: CT ABDOMEN AND PELVIS WITH CONTRAST TECHNIQUE: Multidetector CT imaging of the abdomen and pelvis was performed using the standard protocol following bolus administration of intravenous contrast. RADIATION DOSE REDUCTION: This exam was performed according to the departmental dose-optimization program which includes automated exposure control, adjustment of the mA and/or kV according to patient size and/or use of iterative reconstruction technique. CONTRAST:  8m OMNIPAQUE IOHEXOL 350 MG/ML SOLN COMPARISON:  09/18/2019 FINDINGS: Lower chest: No acute abnormality Hepatobiliary: Stable cyst in the left hepatic dome. No suspicious focal hepatic abnormality. Gallbladder unremarkable. Pancreas: No focal abnormality or ductal dilatation. Spleen: No focal abnormality.   Normal size. Adrenals/Urinary Tract: No adrenal abnormality. No focal renal abnormality. No stones or hydronephrosis. Urinary bladder is unremarkable. Stomach/Bowel: Left colonic diverticulosis. Inflammatory stranding around the distal descending/proximal sigmoid colon compatible with active diverticulitis. Stomach and small bowel decompressed, unremarkable. Vascular/Lymphatic: No evidence of aneurysm or adenopathy. Reproductive: Prostate enlargement Other: No free fluid or free air. Musculoskeletal: No acute bony abnormality IMPRESSION: Left colonic diverticulosis. Inflammatory stranding around the distal descending/proximal sigmoid colon compatible with active diverticulitis. No complicating feature. Electronically Signed   By: KRolm BaptiseM.D.   On: 06/20/2022 20:14    Procedures Procedures    Medications Ordered in ED Medications  fentaNYL (SUBLIMAZE) injection 50 mcg (has no administration in time range)  ondansetron (ZOFRAN) injection 4 mg (has no administration in time  range)  lactated ringers bolus 1,000 mL (1,000 mLs Intravenous New Bag/Given 06/20/22 1855)  iohexol (OMNIPAQUE) 350 MG/ML injection 75 mL (75 mLs Intravenous Contrast Given 06/20/22 1954)    ED Course/ Medical Decision Making/ A&P Clinical Course as of 06/20/22 2039  Mon Jun 20, 2022  1943 WBC(!): 14.3 [JL]    Clinical Course User Index [JL] Regan Lemming, MD                           Medical Decision Making Amount and/or Complexity of Data Reviewed Labs: ordered. Decision-making details documented in ED Course. Radiology: ordered. ECG/medicine tests: ordered.  Risk Prescription drug management.     76 year old male with medical history significant for diverticulitis, depression, anxiety, GERD, internal hemorrhoids, IBS who presents to the emergency department with left lower quadrant abdominal pain.  The patient states that he has a history of diverticulitis flares and feels like he is having another 1.  He  endorses left lower quadrant pain that started yesterday.  He denies any nausea or vomiting.  No radiation of the pain.  He is passing gas as usual.  Having normal bowel movements with the most recent bowel movement at 1500 today.  He is tolerating oral intake with no vomiting.  No has any fevers or chills.  No dysuria or increased urinary frequency.  On arrival, the patient was vitally stable. Normal sinus rhythm noted on cardiac telemetry. Physical exam significant for left lower quadrant tenderness to palpation, no rebound or guarding.  Differential includes diverticulitis versus enteritis.  Laboratory evaluation significant for leukocytosis to 14.3.  CT imaging revealed acute uncomplicated diverticulitis. IMPRESSION:  Left colonic diverticulosis. Inflammatory stranding around the  distal descending/proximal sigmoid colon compatible with active  diverticulitis. No complicating feature.   We will treat with Cipro and Flagyl orally outpatient.  Return precautions provided.  Stable for discharge.   Final Clinical Impression(s) / ED Diagnoses Final diagnoses:  Diverticulitis    Rx / DC Orders ED Discharge Orders          Ordered    ciprofloxacin (CIPRO) 500 MG tablet  Every 12 hours        06/20/22 2038    metroNIDAZOLE (FLAGYL) 500 MG tablet  2 times daily        06/20/22 2038              Regan Lemming, MD 06/20/22 2039

## 2022-06-20 NOTE — ED Notes (Signed)
Client states his BP is normally not elevated, does endorse a hx of A-fib. Will perform an ECG. Denies chest pain or shortness of breath

## 2022-06-20 NOTE — Telephone Encounter (Signed)
Please lookout for access nurse note 

## 2022-06-20 NOTE — ED Notes (Signed)
VS reassessed due to elevated BP, denies any hx of HTN

## 2022-06-20 NOTE — ED Notes (Signed)
Began having intermittent pain at LLQ yesterday, denies any nausea or vomiting. Last BM 1500hrs today. Soft softness noted in stool. Passing flatus as usual. Able to take in POs well without difficulty. Denies fevers at home.

## 2022-06-20 NOTE — Telephone Encounter (Signed)
Patient called and stated that he has pain in the left side of his colon. Patient was sent to access nurse.

## 2022-06-20 NOTE — ED Notes (Signed)
Patient transported to CT 

## 2022-06-20 NOTE — Telephone Encounter (Signed)
Per access note pt agreed to be seen today at [facility] or ED. Sending note to Dr Damita Dunnings and Janett Billow CMA.

## 2022-06-20 NOTE — ED Notes (Signed)
Patient denies pain and is resting comfortably. PRN Fentanyl ordered and pt refused Zofran and Fentanyl at this time. IV started and IVF running.

## 2022-06-21 NOTE — Telephone Encounter (Signed)
Noted. Thanks.

## 2022-07-01 ENCOUNTER — Ambulatory Visit (INDEPENDENT_AMBULATORY_CARE_PROVIDER_SITE_OTHER): Payer: Medicare Other | Admitting: Family Medicine

## 2022-07-01 ENCOUNTER — Encounter: Payer: Self-pay | Admitting: Family Medicine

## 2022-07-01 VITALS — BP 128/80 | HR 74 | Temp 97.9°F | Ht 69.0 in | Wt 176.0 lb

## 2022-07-01 DIAGNOSIS — K5792 Diverticulitis of intestine, part unspecified, without perforation or abscess without bleeding: Secondary | ICD-10-CM | POA: Diagnosis not present

## 2022-07-01 DIAGNOSIS — R2689 Other abnormalities of gait and mobility: Secondary | ICD-10-CM | POA: Diagnosis not present

## 2022-07-01 NOTE — Progress Notes (Unsigned)
ER eval for diverticulitis.  LLQ pain started, typical for prev flares.  Done with cipro flagyl.  Clearly better. No FCNAVD.  He has occ brief changes in balance over the last year w/o vertigo.    He had variable BP readings.  EMG is still pending per neuro.    Meds, vitals, and allergies reviewed.   ROS: Per HPI unless specifically indicated in ROS section   Recheck our cuff 130/78.  149/98 on his cuff It appears that his cuff is reading inappropriately high.  GEN: nad, alert and oriented HEENT: mucous membranes moist NECK: supple w/o LA CV: rrr.   PULM: ctab, no inc wob ABD: soft, +bs, nontender to palpation EXT: no edema SKIN: no acute rash We tested him for orthostatic symptoms.  He can get sx with squatting then standing w/o head turning.

## 2022-07-01 NOTE — Patient Instructions (Addendum)
Increase water intake and let me know if you continue to have balance changes.  You may need to stop flomax in the future.  Take care.  Glad to see you.

## 2022-07-03 DIAGNOSIS — R2689 Other abnormalities of gait and mobility: Secondary | ICD-10-CM | POA: Insufficient documentation

## 2022-07-03 NOTE — Assessment & Plan Note (Signed)
He could be mildly lightheaded related to Flomax use.  He is going to increase his fluid intake and then let me know if his symptoms do not improve.  We may have to taper Flomax or consider other options.  It does not appear that he has vertigo.

## 2022-07-03 NOTE — Assessment & Plan Note (Signed)
Fortunately he is clearly better.  Routine cautions given to patient.  He will update [place] as needed.

## 2022-07-05 ENCOUNTER — Telehealth: Payer: Self-pay | Admitting: Family Medicine

## 2022-07-05 NOTE — Telephone Encounter (Signed)
LVM for pt to rtn my call to schedule AWV with [facility] call back # [phone] 

## 2022-07-20 ENCOUNTER — Ambulatory Visit (INDEPENDENT_AMBULATORY_CARE_PROVIDER_SITE_OTHER): Payer: Medicare Other

## 2022-07-20 VITALS — Ht 69.0 in | Wt 176.0 lb

## 2022-07-20 DIAGNOSIS — Z0000 Encounter for general adult medical examination without abnormal findings: Secondary | ICD-10-CM | POA: Diagnosis not present

## 2022-07-20 NOTE — Patient Instructions (Addendum)
Mason [name] , Thank you for taking time to come for your Medicare Wellness Visit. I appreciate your ongoing commitment to your health goals. Please review the following plan we discussed and let me know if I can assist you in the future.   These are the goals we discussed:  Goals      Increase physical activity     Walk in the mall for exercise     Patient Stated     Starting 06/26/2018, I will continue to take medications as prescribed.      Patient Stated     07/07/2020, I will maintain and continue medications as prescribed.         This is a list of the screening recommended for you and due dates:  Health Maintenance  Topic Date Due   COVID-19 Vaccine (1) 08/05/2022*   Medicare Annual Wellness Visit  07/21/2023   Pneumonia Vaccine  Completed   Flu Shot  Completed   Hepatitis C Screening: USPSTF Recommendation to screen - Ages 18-79 yo.  Completed   Zoster (Shingles) Vaccine  Completed   HPV Vaccine  Aged Out   Colon Cancer Screening  Discontinued  *Topic was postponed. The date shown is not the original due date.    Advanced directives: End of life planning; Advance aging; Advanced directives discussed.  Copy of current HCPOA/Living Will requested.    Conditions/risks identified: none new.  Next appointment: Follow up in one year for your annual wellness visit.   Preventive Care 34 Years and Older, Male  Preventive care refers to lifestyle choices and visits with your health care provider that can promote health and wellness. What does preventive care include? A yearly physical exam. This is also called an annual well check. Dental exams once or twice a year. Routine eye exams. Ask your health care provider how often you should have your eyes checked. Personal lifestyle choices, including: Daily care of your teeth and gums. Regular physical activity. Eating a healthy diet. Avoiding tobacco and drug use. Limiting alcohol use. Practicing safe sex. Taking low  doses of aspirin every day. Taking vitamin and mineral supplements as recommended by your health care provider. What happens during an annual well check? The services and screenings done by your health care provider during your annual well check will depend on your age, overall health, lifestyle risk factors, and family history of disease. Counseling  Your health care provider may ask you questions about your: Alcohol use. Tobacco use. Drug use. Emotional well-being. Home and relationship well-being. Sexual activity. Eating habits. History of falls. Memory and ability to understand (cognition). Work and work Statistician. Screening  You may have the following tests or measurements: Height, weight, and BMI. Blood pressure. Lipid and cholesterol levels. These may be checked every 5 years, or more frequently if you are over 50 years old. Skin check. Lung cancer screening. You may have this screening every year starting at age 29 if you have a 30-pack-year history of smoking and currently smoke or have quit within the past 15 years. Fecal occult blood test (FOBT) of the stool. You may have this test every year starting at age 17. Flexible sigmoidoscopy or colonoscopy. You may have a sigmoidoscopy every 5 years or a colonoscopy every 10 years starting at age 25. Prostate cancer screening. Recommendations will vary depending on your family history and other risks. Hepatitis C blood test. Hepatitis B blood test. Sexually transmitted disease (STD) testing. Diabetes screening. This is done by checking your blood  sugar (glucose) after you have not eaten for a while (fasting). You may have this done every 1-3 years. Abdominal aortic aneurysm (AAA) screening. You may need this if you are a current or former smoker. Osteoporosis. You may be screened starting at age 52 if you are at high risk. Talk with your health care provider about your test results, treatment options, and if necessary, the need  for more tests. Vaccines  Your health care provider may recommend certain vaccines, such as: Influenza vaccine. This is recommended every year. Tetanus, diphtheria, and acellular pertussis (Tdap, Td) vaccine. You may need a Td booster every 10 years. Zoster vaccine. You may need this after age 5. Pneumococcal 13-valent conjugate (PCV13) vaccine. One dose is recommended after age 70. Pneumococcal polysaccharide (PPSV23) vaccine. One dose is recommended after age 44. Talk to your health care provider about which screenings and vaccines you need and how often you need them. This information is not intended to replace advice given to you by your health care provider. Make sure you discuss any questions you have with your health care provider. Document Released: 09/04/2015 Document Revised: 04/27/2016 Document Reviewed: 06/09/2015 Elsevier Interactive Patient Education  2017 [place] Prevention in the Home Falls can cause injuries. They can happen to people of all ages. There are many things you can do to make your home safe and to help prevent falls. What can I do on the outside of my home? Regularly fix the edges of walkways and driveways and fix any cracks. Remove anything that might make you trip as you walk through a door, such as a raised step or threshold. Trim any bushes or trees on the path to your home. Use bright outdoor lighting. Clear any walking paths of anything that might make someone trip, such as rocks or tools. Regularly check to see if handrails are loose or broken. Make sure that both sides of any steps have handrails. Any raised decks and porches should have guardrails on the edges. Have any leaves, snow, or ice cleared regularly. Use sand or salt on walking paths during winter. Clean up any spills in your garage right away. This includes oil or grease spills. What can I do in the bathroom? Use night lights. Install grab bars by the toilet and in the tub and  shower. Do not use towel bars as grab bars. Use non-skid mats or decals in the tub or shower. If you need to sit down in the shower, use a plastic, non-slip stool. Keep the floor dry. Clean up any water that spills on the floor as soon as it happens. Remove soap buildup in the tub or shower regularly. Attach bath mats securely with double-sided non-slip rug tape. Do not have throw rugs and other things on the floor that can make you trip. What can I do in the bedroom? Use night lights. Make sure that you have a light by your bed that is easy to reach. Do not use any sheets or blankets that are too big for your bed. They should not hang down onto the floor. Have a firm chair that has side arms. You can use this for support while you get dressed. Do not have throw rugs and other things on the floor that can make you trip. What can I do in the kitchen? Clean up any spills right away. Avoid walking on wet floors. Keep items that you use a lot in easy-to-reach places. If you need to reach something above you,  use a strong step stool that has a grab bar. Keep electrical cords out of the way. Do not use floor polish or wax that makes floors slippery. If you must use wax, use non-skid floor wax. Do not have throw rugs and other things on the floor that can make you trip. What can I do with my stairs? Do not leave any items on the stairs. Make sure that there are handrails on both sides of the stairs and use them. Fix handrails that are broken or loose. Make sure that handrails are as long as the stairways. Check any carpeting to make sure that it is firmly attached to the stairs. Fix any carpet that is loose or worn. Avoid having throw rugs at the top or bottom of the stairs. If you do have throw rugs, attach them to the floor with carpet tape. Make sure that you have a light switch at the top of the stairs and the bottom of the stairs. If you do not have them, ask someone to add them for  you. What else can I do to help prevent falls? Wear shoes that: Do not have high heels. Have rubber bottoms. Are comfortable and fit you well. Are closed at the toe. Do not wear sandals. If you use a stepladder: Make sure that it is fully opened. Do not climb a closed stepladder. Make sure that both sides of the stepladder are locked into place. Ask someone to hold it for you, if possible. Clearly mark and make sure that you can see: Any grab bars or handrails. First and last steps. Where the edge of each step is. Use tools that help you move around (mobility aids) if they are needed. These include: Canes. Walkers. Scooters. Crutches. Turn on the lights when you go into a dark area. Replace any light bulbs as soon as they burn out. Set up your furniture so you have a clear path. Avoid moving your furniture around. If any of your floors are uneven, fix them. If there are any pets around you, be aware of where they are. Review your medicines with your doctor. Some medicines can make you feel dizzy. This can increase your chance of falling. Ask your doctor what other things that you can do to help prevent falls. This information is not intended to replace advice given to you by your health care provider. Make sure you discuss any questions you have with your health care provider. Document Released: 06/04/2009 Document Revised: 01/14/2016 Document Reviewed: 09/12/2014 Elsevier Interactive Patient Education  2017 [employer].

## 2022-07-20 NOTE — Progress Notes (Signed)
Subjective:   Mason [name] is a 76 y.o. male who presents for Medicare Annual/Subsequent preventive examination.  Review of Systems    No ROS.  Medicare Wellness Virtual Visit.  Visual/audio telehealth visit, UTA vital signs.   See social history for additional risk factors.   Cardiac Risk Factors include: advanced age (>68mn, >>40women);male gender     Objective:    Today's Vitals   07/20/22 0825  Weight: 176 lb (79.8 kg)  Height: '5\' 9"'$  (1.753 m)   Body mass index is 25.99 kg/m.     07/20/2022    8:34 AM 06/20/2022    3:37 PM 07/08/2021    2:00 PM 07/07/2020    8:19 AM 05/08/2020   12:10 PM 10/04/2019   10:34 AM 09/18/2019    9:50 PM  Advanced Directives  Does Patient Have a Medical Advance Directive? No No No No No No No  Would patient like information on creating a medical advance directive? No - Patient declined No - Patient declined Yes (MAU/Ambulatory/Procedural Areas - Information given) No - Patient declined  No - Patient declined No - Patient declined    Current Medications (verified) Outpatient Encounter Medications as of 07/20/2022  Medication Sig   b complex vitamins capsule Take 1 capsule by mouth daily.   bimatoprost (LUMIGAN) 0.01 % SOLN Apply 1 drop to eye daily.   Cholecalciferol (VITAMIN D) 125 MCG (5000 UT) CAPS Take 1 tablet by mouth daily.   esomeprazole (NEXIUM) 40 MG capsule Take 1 capsule (40 mg total) by mouth 2 (two) times daily.   levocetirizine (XYZAL) 5 MG tablet TAKE 1 TABLET BY MOUTH EVERY DAY IN THE EVENING   mometasone (ELOCON) 0.1 % cream Apply to affected skin on the neck qd-bid prn irritation   rivaroxaban (XARELTO) 20 MG TABS tablet Take 1 tablet (20 mg total) by mouth daily with supper.   tamsulosin (FLOMAX) 0.4 MG CAPS capsule TAKE 1-2 CAPSULES (0.4- 0.8 MG TOTAL) BY MOUTH DAILY.   timolol (TIMOPTIC-XR) 0.5 % ophthalmic gel-forming Place 1 drop into the left eye every morning.   vitamin C (ASCORBIC ACID) 500 MG tablet Take  500 mg by mouth daily.   zinc gluconate 50 MG tablet Take 50 mg by mouth daily.   No facility-administered encounter medications on file as of 07/20/2022.    Allergies (verified) Chlordiazepoxide-clidinium, Citalopram hydrobromide, Erythromycin base, Hyoscyamine sulfate, Meloxicam, Penicillins, and Shrimp [shellfish allergy]   History: Past Medical History:  Diagnosis Date   Actinic keratosis 04/02/2013   L sup scapula - bx proven   Actinic keratosis 04/02/2013   R med ant deltoid - bx proven   Allergy    Anal fissure    Anxiety    Basal cell carcinoma    back - treated in the past    Basal cell carcinoma 05/02/2009   left supratip, Mohs   [place]    basal cell removed x4   Cataract    bil eyes   Depression    Diverticulosis of colon    GERD (gastroesophageal reflux disease)    esophageal narrowing   Glaucoma    Hiatal hernia    IBS (irritable bowel syndrome)    Insomnia    Internal hemorrhoids    Past Surgical History:  Procedure Laterality Date   ANAL SPHINCTEROTOMY  03/1995   fistulotomy   APPENDECTOMY     BASAL CELL CARCINOMA EXCISION     on back   BICEPT TENODESIS Right 09/27/2004   decompression  COLONOSCOPY  04/1999   internal hemmroids,12-11-08 colon tics and hems only    ESOPHAGOGASTRODUODENOSCOPY  04/1999   with esophagitis and stricture   MOHS SURGERY  09/02/2008   L supratip of nose with flap,basal cell cancer [facility]/MOHS   MOUTH SURGERY  01/2020   TONGUE SURGERY  2017   had small place removed, noncancerous   TONSILLECTOMY AND ADENOIDECTOMY     remote   UPPER GASTROINTESTINAL ENDOSCOPY     WRIST SURGERY Right    WRIST SURGERY     scar tissue removed   Family History  Problem Relation Age of Onset   Hypertension Mother    Stroke Mother    Pneumonia Father    Bladder Cancer Father    Cancer Maternal Aunt        ? type   Breast cancer Maternal Aunt        mets   Esophageal cancer Maternal Aunt        aunt ? esophageal ca   Stomach  cancer Maternal Aunt        aunt ? stmach ca   Colon cancer Maternal Aunt        had colostomy - not sure of origin   Cancer Maternal Grandmother        ? type   Prostate cancer Neg Hx    Rectal cancer Neg Hx    Social History   Socioeconomic History   Marital status: Divorced    Spouse name: Not on file   Number of children: 1   Years of education: Not on file   Highest education level: Not on file  Occupational History   Occupation: Air cabin crew, retired 1997    Employer: [employer]  Tobacco Use   Smoking status: Former    Types: Cigarettes, Cigars    Quit date: 08/22/1998    Years since quitting: 23.9   Smokeless tobacco: Never   Tobacco comments:    cigarettes 1968, cigars 2000  Vaping Use   Vaping Use: Never used  Substance and Sexual Activity   Alcohol use: Not Currently    Alcohol/week: 0.0 standard drinks of alcohol    Comment: rare   Drug use: No   Sexual activity: Never  Other Topics Concern   Not on file  Social History Narrative   Retired from [employer] divorced    1 son   Ellis Savage '63-'67, no known agent orange exposure   Social Determinants of Radio broadcast assistant Strain: Low Risk  (07/20/2022)   Overall Financial Resource Strain (CARDIA)    Difficulty of Paying Living Expenses: Not hard at all  Food Insecurity: No Food Insecurity (07/20/2022)   Hunger Vital Sign    Worried About Running Out of Food in the Last Year: Never true    [place] in the Last Year: Never true  Transportation Needs: No Transportation Needs (07/20/2022)   PRAPARE - Hydrologist (Medical): No    Lack of Transportation (Non-Medical): No  Physical Activity: Insufficiently Active (07/20/2022)   Exercise Vital Sign    Days of Exercise per Week: 3 days    Minutes of Exercise per Session: 30 min  Stress: No Stress Concern Present (07/20/2022)   [place]    Feeling of Stress : Not at all  Social Connections: Socially Isolated (07/20/2022)   Social Connection and Isolation Panel [NHANES]  Frequency of Communication with Friends and Family: More than three times a week    Frequency of Social Gatherings with Friends and Family: Twice a week    Attends Religious Services: Never    Marine scientist or Organizations: No    Attends Music therapist: Never    Marital Status: Divorced    Tobacco Counseling Counseling given: Not Answered Tobacco comments: cigarettes 1968, cigars 2000   Clinical Intake:  Pre-visit preparation completed: Yes        Diabetes: No  How often do you need to have someone help you when you read instructions, pamphlets, or other written materials from your doctor or pharmacy?: 1 - Never    Interpreter Needed?: No      Activities of Daily Living    07/20/2022    8:35 AM  In your present state of health, do you have any difficulty performing the following activities:  Hearing? 0  Vision? 0  Difficulty concentrating or making decisions? 0  Walking or climbing stairs? 0  Dressing or bathing? 0  Doing errands, shopping? 0  Preparing Food and eating ? N  Using the Toilet? N  In the past six months, have you accidently leaked urine? N  Do you have problems with loss of bowel control? N  Managing your Medications? N  Managing your Finances? N  Housekeeping or managing your Housekeeping? N    Patient Care Team: Tonia Ghent, MD as PCP - General (Family Medicine) Ralene Bathe, MD as Consulting Physician (Dermatology) Pinnix-Bailey, Damaris Schooner, MD as Consulting Physician (Dentistry) Celine Ahr. [place] Bali, OD as Consulting Physician (Optometry) Pieter Partridge, DO as Consulting Physician (Neurology)  Indicate any recent Medical Services you may have received from other than [facility] providers in the past year (date may be approximate).     Assessment:   This is a  routine wellness examination for Markell.  I connected with  GEOVONNI MEYERHOFF on 07/20/22 by a audio enabled telemedicine application and verified that I am speaking with the correct person using two identifiers.  Patient Location: Home  Provider Location: Office/Clinic  I discussed the limitations of evaluation and management by telemedicine. The patient expressed understanding and agreed to proceed.   Hearing/Vision screen Hearing Screening - Comments:: Patient is able to hear conversational tones without difficulty.  No issues reported.   Vision Screening - Comments:: Cataracts extracted, bilateral Wears readers only Glaucoma; drops in use Visits every 4 months  Dietary issues and exercise activities discussed: Current Exercise Habits: Home exercise routine, Intensity: Mild  Regular diet Good water intake   Goals Addressed             This Visit's Progress    Increase physical activity       Walk in the mall for exercise     COMPLETED: Patient Stated       Would like to continue to take medications as prescibed       Depression Screen    07/20/2022    8:33 AM 07/08/2021    2:08 PM 07/07/2020    8:23 AM 07/04/2019   11:00 AM 06/26/2018    8:19 AM 06/08/2017    2:01 PM 05/24/2016   10:15 AM  PHQ 2/9 Scores  PHQ - 2 Score 0 0 0 0 0 0 0  PHQ- 9 Score   0  0 0     Fall Risk    07/20/2022    8:35 AM 07/08/2021  2:05 PM 07/07/2020    8:22 AM 07/30/2019    7:47 AM 07/04/2019   10:59 AM  Fall Risk   Falls in the past year? 0 1 0 0 0  Number falls in past yr: 0 0 0    Injury with Fall? 0 0 0    Risk for fall due to :   Medication side effect    Follow up Falls evaluation completed Falls prevention discussed Falls evaluation completed;Falls prevention discussed      FALL RISK PREVENTION PERTAINING TO THE HOME: Home free of loose throw rugs in walkways, pet beds, electrical cords, etc? Yes  Adequate lighting in your home to reduce risk of falls? Yes    ASSISTIVE DEVICES UTILIZED TO PREVENT FALLS: Life alert? No  Use of a cane, walker or w/c? No  Grab bars in the bathroom? Yes  Shower chair or bench in shower? Yes  Elevated toilet seat or a handicapped toilet? No   TIMED UP AND GO: Was the test performed? No .   Cognitive Function:    07/07/2020    8:29 AM 06/26/2018    8:19 AM 06/08/2017    2:25 PM 05/24/2016   10:18 AM  MMSE - Mini Mental State Exam  Orientation to time '5 5 5 5  '$ Orientation to Place '5 5 5 5  '$ Registration '3 3 3 3  '$ Attention/ Calculation 5 0 0 0  Recall '3 3 3 3  '$ Language- name 2 objects  0 0 0  Language- repeat '1 1 1 1  '$ Language- follow 3 step command  '3 3 3  '$ Language- read & follow direction  0 0 0  Write a sentence  0 0 0  Copy design  0 0 0  Total score  '20 20 20        '$ 07/20/2022    8:37 AM  6CIT Screen  What Year? 0 points  What month? 0 points  What time? 0 points  Count back from 20 0 points  Months in reverse 0 points  Repeat phrase 0 points  Total Score 0 points    Immunizations Immunization History  Administered Date(s) Administered   Fluad Quad(high Dose 65+) 04/25/2019, 07/07/2020   Influenza Split 05/17/2011, 05/31/2012   Influenza, High Dose Seasonal PF 05/23/2018, 05/31/2021, 05/17/2022   Influenza,inj,Quad PF,6+ Mos 04/29/2013, 05/01/2014, 05/13/2015, 05/24/2016   Influenza-Unspecified 05/12/2017, 05/31/2021   Pneumococcal Conjugate-13 08/28/2014   Pneumococcal Polysaccharide-23 10/28/2010   Td 07/22/2002, 11/03/2011   Tdap 06/03/2022   Zoster Recombinat (Shingrix) 06/12/2019, 08/28/2019   Covid-19 vaccine status: Declined, Education has been provided regarding the importance of this vaccine but patient still declined. Advised may receive this vaccine at local pharmacy or Health Dept.or vaccine clinic. Aware to provide a copy of the vaccination record if obtained from local pharmacy or Health Dept. Verbalized acceptance and understanding.  Screening Tests Health  Maintenance  Topic Date Due   COVID-19 Vaccine (1) 08/05/2022 (Originally 08/20/1951)   Medicare Annual Wellness (AWV)  07/21/2023   Pneumonia Vaccine 77+ Years old  Completed   INFLUENZA VACCINE  Completed   Hepatitis C Screening  Completed   Zoster Vaccines- Shingrix  Completed   HPV VACCINES  Aged Out   COLONOSCOPY (Pts 45-69yr Insurance coverage will need to be confirmed)  Discontinued   Health Maintenance There are no preventive care reminders to display for this patient.  Lung Cancer Screening: (Low Dose CT Chest recommended if Age 78-80years, 30 pack-year currently smoking OR have quit  w/in 15years.) does not qualify.   Hepatitis C Screening: Completed 2021.  Vision Screening: Recommended annual ophthalmology exams for early detection of glaucoma and other disorders of the eye.  Dental Screening: Recommended annual dental exams for proper oral hygiene. Visits every 6 months.   Community Resource Referral / Chronic Care Management: CRR required this visit?  No   CCM required this visit?  No      Plan:     I have personally reviewed and noted the following in the patient's chart:   Medical and social history Use of alcohol, tobacco or illicit drugs  Current medications and supplements including opioid prescriptions. Patient is not currently taking opioid prescriptions. Functional ability and status Nutritional status Physical activity Advanced directives List of other physicians Hospitalizations, surgeries, and ER visits in previous 12 months Vitals Screenings to include cognitive, depression, and falls Referrals and appointments  In addition, I have reviewed and discussed with patient certain preventive protocols, quality metrics, and best practice recommendations. A written personalized care plan for preventive services as well as general preventive health recommendations were provided to patient.     Leta Jungling, LPN   04/04/4817

## 2022-09-26 ENCOUNTER — Ambulatory Visit (INDEPENDENT_AMBULATORY_CARE_PROVIDER_SITE_OTHER): Payer: Medicare Other | Admitting: Family Medicine

## 2022-09-26 ENCOUNTER — Encounter: Payer: Self-pay | Admitting: Family Medicine

## 2022-09-26 VITALS — BP 144/82 | HR 65 | Temp 97.8°F | Ht 69.0 in | Wt 177.1 lb

## 2022-09-26 DIAGNOSIS — J208 Acute bronchitis due to other specified organisms: Secondary | ICD-10-CM

## 2022-09-26 MED ORDER — DOXYCYCLINE HYCLATE 100 MG PO TABS: 100.0000 mg | ORAL_TABLET | Freq: Two times a day (BID) | ORAL | 0 refills | Status: DC

## 2022-09-26 NOTE — Progress Notes (Unsigned)
Jacqulyne Gladue T. Alexianna Nachreiner, MD, CAQ Sports Medicine [facility] at [facility] [address] [place], [address]  Phone: [phone]  FAX: [phone]  Mason [name] - 77 y.o. male  MRN [id number]  Date of Birth: [birth year]  Date: 09/26/2022  PCP: Joaquim Nam, MD  Referral: Joaquim Nam, MD  Chief Complaint  Patient presents with   Fatigue    Were exposed to Covid 09/13/21-Had 3 negative Covid test after that.   Generalized Body Aches   Fever    Low Grade   Night Sweats   Cough    Dry-Leaves bad taste in mouth   Shortness of Breath        Subjective:   Mason [name] is a 77 y.o. very pleasant male patient with Body mass index is 26.16 kg/m. who presents with the following:  3 weeks ago, started to sneeze, running nose.  Has tested for Covid x 3.   Tired, does not feel that good.  Does not feel like doing much of anything.  T-shirt is soaked.  He complains of global fatigue for several weeks including generalized bodyaches.  He also developed a fever last night, and he has had a lot of night sweats. He does also have a productive cough and is recently had some shortness of breath that is been worsening over the last few days.  On xarelto for A. Fib.   100.6 temp last Saturday, took nyquil and went to bed.  99.6 last night.     Review of Systems is noted in the HPI, as appropriate  Objective:   BP (!) 144/82   Pulse 65   Temp 97.8 F (36.6 C) (Temporal)   Ht 5\' 9"  (1.753 m)   Wt 177 lb 2 oz (80.3 kg)   SpO2 99%   BMI 26.16 kg/m    Gen: WDWN, NAD. Globally Non-toxic HEENT: Throat clear, w/o exudate, R TM clear, L TM - good landmarks, No fluid present. rhinnorhea.  MMM Frontal sinuses: NT Max sinuses: NT NECK: Anterior cervical  LAD is absent CV: RRR, No M/G/R, cap refill <2 sec PULM: Breathing comfortably in no respiratory distress. no wheezing, crackles, rhonchi   Laboratory and Imaging Data:  Assessment and  Plan:     ICD-10-CM   1. Acute bronchitis due to other specified organisms  J20.8      3 weeks of symptoms with worsening over the last few days including fever, some shortness of breath.  Patient is on Xarelto chronically, and I think with a higher risk of bacterial infection at this point, I am going to cover him with some antibiotics.  Medication Management during today's office visit: Meds ordered this encounter  Medications   doxycycline (VIBRA-TABS) 100 MG tablet    Sig: Take 1 tablet (100 mg total) by mouth 2 (two) times daily.    Dispense:  20 tablet    Refill:  0   There are no discontinued medications.  Orders placed today for conditions managed today: No orders of the defined types were placed in this encounter.   Disposition: No follow-ups on file.  Dragon [employer] One speech-to-text software was used for transcription in this dictation.  Possible transcriptional errors can occur using Animal nutritionist.   Signed,  Elpidio Galea. Edythe Riches, MD   Outpatient Encounter Medications as of 09/26/2022  Medication Sig   b complex vitamins capsule Take 1 capsule by mouth daily.   bimatoprost (LUMIGAN) 0.01 % SOLN Apply 1  drop to eye daily.   Cholecalciferol (VITAMIN D) 125 MCG (5000 UT) CAPS Take 1 tablet by mouth daily.   doxycycline (VIBRA-TABS) 100 MG tablet Take 1 tablet (100 mg total) by mouth 2 (two) times daily.   esomeprazole (NEXIUM) 40 MG capsule Take 1 capsule (40 mg total) by mouth 2 (two) times daily.   levocetirizine (XYZAL) 5 MG tablet TAKE 1 TABLET BY MOUTH EVERY DAY IN THE EVENING   mometasone (ELOCON) 0.1 % cream Apply to affected skin on the neck qd-bid prn irritation   rivaroxaban (XARELTO) 20 MG TABS tablet Take 1 tablet (20 mg total) by mouth daily with supper.   tamsulosin (FLOMAX) 0.4 MG CAPS capsule TAKE 1-2 CAPSULES (0.4- 0.8 MG TOTAL) BY MOUTH DAILY.   timolol (TIMOPTIC-XR) 0.5 % ophthalmic gel-forming Place 1 drop into the left eye every morning.    vitamin C (ASCORBIC ACID) 500 MG tablet Take 500 mg by mouth daily.   zinc gluconate 50 MG tablet Take 50 mg by mouth daily.   No facility-administered encounter medications on file as of 09/26/2022.

## 2022-09-27 ENCOUNTER — Encounter: Payer: Self-pay | Admitting: Family Medicine

## 2022-10-04 ENCOUNTER — Ambulatory Visit (INDEPENDENT_AMBULATORY_CARE_PROVIDER_SITE_OTHER)
Admission: RE | Admit: 2022-10-04 | Discharge: 2022-10-04 | Disposition: A | Discharge status: Home / Self Care | Payer: Medicare Other | Source: Ambulatory Visit | Attending: Family Medicine | Admitting: Family Medicine

## 2022-10-04 ENCOUNTER — Encounter: Payer: Self-pay | Admitting: Family Medicine

## 2022-10-04 ENCOUNTER — Ambulatory Visit (INDEPENDENT_AMBULATORY_CARE_PROVIDER_SITE_OTHER): Payer: Medicare Other | Admitting: Family Medicine

## 2022-10-04 VITALS — BP 124/84 | HR 75 | Temp 98.3°F | Ht 69.0 in | Wt 175.0 lb

## 2022-10-04 DIAGNOSIS — R059 Cough, unspecified: Secondary | ICD-10-CM

## 2022-10-04 LAB — CBC WITH DIFFERENTIAL/PLATELET
Basophils Absolute: 0.2 10*3/uL — ABNORMAL HIGH (ref 0.0–0.1)
Basophils Relative: 1.3 % (ref 0.0–3.0)
Eosinophils Absolute: 0.6 10*3/uL (ref 0.0–0.7)
Eosinophils Relative: 5.4 % — ABNORMAL HIGH (ref 0.0–5.0)
HCT: 49.4 % (ref 39.0–52.0)
Hemoglobin: 16.5 g/dL (ref 13.0–17.0)
Lymphocytes Relative: 11.2 % — ABNORMAL LOW (ref 12.0–46.0)
Lymphs Abs: 1.3 10*3/uL (ref 0.7–4.0)
MCHC: 33.3 g/dL (ref 30.0–36.0)
MCV: 92.1 fl (ref 78.0–100.0)
Monocytes Absolute: 0.9 10*3/uL (ref 0.1–1.0)
Monocytes Relative: 8 % (ref 3.0–12.0)
Neutro Abs: 8.6 10*3/uL — ABNORMAL HIGH (ref 1.4–7.7)
Neutrophils Relative %: 74.1 % (ref 43.0–77.0)
Platelets: 482 10*3/uL — ABNORMAL HIGH (ref 150.0–400.0)
RBC: 5.37 Mil/uL (ref 4.22–5.81)
RDW: 15.7 % — ABNORMAL HIGH (ref 11.5–15.5)
WBC: 11.7 10*3/uL — ABNORMAL HIGH (ref 4.0–10.5)

## 2022-10-04 LAB — COMPREHENSIVE METABOLIC PANEL
ALT: 18 U/L (ref 0–53)
AST: 21 U/L (ref 0–37)
Albumin: 3.9 g/dL (ref 3.5–5.2)
Alkaline Phosphatase: 72 U/L (ref 39–117)
BUN: 13 mg/dL (ref 6–23)
CO2: 31 mEq/L (ref 19–32)
Calcium: 9.3 mg/dL (ref 8.4–10.5)
Chloride: 97 mEq/L (ref 96–112)
Creatinine, Ser: 0.99 mg/dL (ref 0.40–1.50)
GFR: 73.16 mL/min (ref >60.00)
Glucose, Bld: 94 mg/dL (ref 70–99)
Potassium: 4.5 mEq/L (ref 3.5–5.1)
Sodium: 134 mEq/L — ABNORMAL LOW (ref 135–145)
Total Bilirubin: 0.5 mg/dL (ref 0.2–1.2)
Total Protein: 7.1 g/dL (ref 6.0–8.3)

## 2022-10-04 LAB — MONONUCLEOSIS SCREEN: Mono Screen: NEGATIVE

## 2022-10-04 NOTE — Progress Notes (Unsigned)
Low grade fever, body aches, bowel issues, nausea and night sweats. Patient states he gets different sx day to day sometimes. He takes tylenol to keep the fever down. States that if he does not take tylenol temp will get over 100 in afternoon.    Sx started with sneezing last month.  He had eval with Dr. Lorelei Pont 09/26/22.    Still having sweats at night.  Nearly done with doxy rx, still on med.  No abd pain but some nausea.  More gas recently.  Looser stools recently.    No tick bites.  Last elevated temp was 99.8 last night and that was with tylenol.  No rash.    Mult neg covid tests. Fatigued and having aches, chills.    Cough is now dry, episodic.    He is having more neck pain in the AM, prior to getting out of bed.  D/w pt about changing pillows to see if that helps.  Prev imaging d/w pt.    Meds, vitals, and allergies reviewed.   ROS: Per HPI unless specifically indicated in ROS section   GEN: nad, alert and oriented HEENT: mucous membranes moist, tm w/o erythema, nasal exam w/o erythema, scant clear discharge noted,  OP with minimal cobblestoning, sinuses not ttp NECK: supple w/o LA CV: rrr.   PULM: ctab, no inc wob EXT: no edema SKIN: no acute rash ABD: not ttp, soft, normal BS

## 2022-10-04 NOTE — Patient Instructions (Signed)
Try to finish the antibiotics.  Go to the lab on the way out.   If you have mychart we'll likely use that to update you.    Take care.  Glad to see you.

## 2022-10-05 NOTE — Assessment & Plan Note (Signed)
I asked him to try to finish his doxycycline in the meantime.  Lungs are clear and still okay for outpatient follow-up.  See notes on labs and x-ray.   Supportive care otherwise.

## 2022-10-06 ENCOUNTER — Telehealth: Payer: Self-pay | Admitting: Family Medicine

## 2022-10-06 ENCOUNTER — Other Ambulatory Visit: Payer: Self-pay | Admitting: Family Medicine

## 2022-10-06 ENCOUNTER — Encounter: Payer: Self-pay | Admitting: Family Medicine

## 2022-10-06 DIAGNOSIS — R509 Fever, unspecified: Secondary | ICD-10-CM

## 2022-10-06 NOTE — Telephone Encounter (Signed)
Scheduled patient an appt to come in tomorrow at 8:00 am.

## 2022-10-06 NOTE — Telephone Encounter (Signed)
Please call pt and set up lab visit (or OV with labs with me if preferred).  I put in the orders.  Unclear source for fever.  I would also check echo given his fevers and I put in the orders for that.  The labs can be done here but he'll get a call about the echo in the meantime.

## 2022-10-07 ENCOUNTER — Encounter: Payer: Self-pay | Admitting: Family Medicine

## 2022-10-07 ENCOUNTER — Ambulatory Visit (INDEPENDENT_AMBULATORY_CARE_PROVIDER_SITE_OTHER): Payer: Medicare Other | Admitting: Family Medicine

## 2022-10-07 VITALS — BP 140/82 | HR 92 | Temp 97.3°F | Ht 69.0 in | Wt 174.0 lb

## 2022-10-07 DIAGNOSIS — R509 Fever, unspecified: Secondary | ICD-10-CM | POA: Diagnosis not present

## 2022-10-07 LAB — URINALYSIS, ROUTINE W REFLEX MICROSCOPIC
Bilirubin Urine: NEGATIVE
Hgb urine dipstick: NEGATIVE
Ketones, ur: NEGATIVE
Leukocytes,Ua: NEGATIVE
Nitrite: NEGATIVE
Specific Gravity, Urine: 1.01 (ref 1.000–1.030)
Total Protein, Urine: NEGATIVE
Urine Glucose: NEGATIVE
Urobilinogen, UA: 0.2 (ref 0.0–1.0)
pH: 6.5 (ref 5.0–8.0)

## 2022-10-07 LAB — CBC WITH DIFFERENTIAL/PLATELET
Basophils Absolute: 0.2 10*3/uL — ABNORMAL HIGH (ref 0.0–0.1)
Basophils Relative: 1.5 % (ref 0.0–3.0)
Eosinophils Absolute: 0.6 10*3/uL (ref 0.0–0.7)
Eosinophils Relative: 5.3 % — ABNORMAL HIGH (ref 0.0–5.0)
HCT: 48.6 % (ref 39.0–52.0)
Hemoglobin: 16.6 g/dL (ref 13.0–17.0)
Lymphocytes Relative: 13 % (ref 12.0–46.0)
Lymphs Abs: 1.4 10*3/uL (ref 0.7–4.0)
MCHC: 34.1 g/dL (ref 30.0–36.0)
MCV: 91.8 fl (ref 78.0–100.0)
Monocytes Absolute: 0.9 10*3/uL (ref 0.1–1.0)
Monocytes Relative: 8.7 % (ref 3.0–12.0)
Neutro Abs: 7.7 10*3/uL (ref 1.4–7.7)
Neutrophils Relative %: 71.5 % (ref 43.0–77.0)
Platelets: 456 10*3/uL — ABNORMAL HIGH (ref 150.0–400.0)
RBC: 5.29 Mil/uL (ref 4.22–5.81)
RDW: 15.9 % — ABNORMAL HIGH (ref 11.5–15.5)
WBC: 10.8 10*3/uL — ABNORMAL HIGH (ref 4.0–10.5)

## 2022-10-07 LAB — SEDIMENTATION RATE: Sed Rate: 14 mm/hr (ref 0–20)

## 2022-10-07 LAB — BASIC METABOLIC PANEL
BUN: 11 mg/dL (ref 6–23)
CO2: 30 mEq/L (ref 19–32)
Calcium: 9.2 mg/dL (ref 8.4–10.5)
Chloride: 97 mEq/L (ref 96–112)
Creatinine, Ser: 1 mg/dL (ref 0.40–1.50)
GFR: 72.28 mL/min (ref >60.00)
Glucose, Bld: 74 mg/dL (ref 70–99)
Potassium: 3.7 mEq/L (ref 3.5–5.1)
Sodium: 135 mEq/L (ref 135–145)

## 2022-10-07 LAB — CK: Total CK: 59 U/L (ref 7–232)

## 2022-10-07 LAB — BRAIN NATRIURETIC PEPTIDE: Pro B Natriuretic peptide (BNP): 45 pg/mL (ref 0.0–100.0)

## 2022-10-07 NOTE — Patient Instructions (Addendum)
Go to the lab on the way out.   If you have mychart we'll likely use that to update you.    You should get a call about the echo.  Update me Monday if you haven't heard about that.  Take care.  Glad to see you. Tylenol as needed for fevers.  Rest and fluids.

## 2022-10-07 NOTE — Progress Notes (Unsigned)
He has been having sweats for weeks.  Likely similar timeline with fevers.  D/w pt about prev eval, doxy use, runny nose, cough.  Fatigued.  No abd pain.  No abdominal aches.  Stools returning to normal after abx use.  No h/o pacer, joint replacement.  No h/o valve replacement. No testicle pain.  No dysuria.  No sputum production.  No facial pain.  No ear pain now but prev R ear pain noted.  No ST. Neck isn't stiff.    D/w pt about FUO in general and workup.  Meds, vitals, and allergies reviewed.   ROS: Per HPI unless specifically indicated in ROS section   Nad ncat Normal neck ROM.  Sinuses not ttp TM wnl Nasal exam stuffy.  OP wnl o/w.   RRR not murmur Ctab Abd soft, not ttp x4 Skin well perfused.  No rash.  No BLE edema.    30 minutes were devoted to patient care in this encounter (this includes time spent reviewing the patient's file/history, interviewing and examining the patient, counseling/reviewing plan with patient).

## 2022-10-09 ENCOUNTER — Encounter: Payer: Self-pay | Admitting: Intensive Care

## 2022-10-09 ENCOUNTER — Emergency Department
Admission: EM | Admit: 2022-10-09 | Discharge: 2022-10-09 | Disposition: A | Discharge status: Home / Self Care | Payer: Medicare Other | Attending: Emergency Medicine | Admitting: Emergency Medicine

## 2022-10-09 ENCOUNTER — Emergency Department: Payer: Medicare Other

## 2022-10-09 ENCOUNTER — Other Ambulatory Visit: Payer: Self-pay

## 2022-10-09 DIAGNOSIS — Z1152 Encounter for screening for COVID-19: Secondary | ICD-10-CM | POA: Insufficient documentation

## 2022-10-09 DIAGNOSIS — R531 Weakness: Secondary | ICD-10-CM | POA: Diagnosis present

## 2022-10-09 DIAGNOSIS — R509 Fever, unspecified: Secondary | ICD-10-CM

## 2022-10-09 LAB — COMPREHENSIVE METABOLIC PANEL WITH GFR
ALT: 21 U/L (ref 0–44)
AST: 24 U/L (ref 15–41)
Albumin: 3.5 g/dL (ref 3.5–5.0)
Alkaline Phosphatase: 66 U/L (ref 38–126)
Anion gap: 7 (ref 5–15)
BUN: 14 mg/dL (ref 8–23)
CO2: 28 mmol/L (ref 22–32)
Calcium: 8.7 mg/dL — ABNORMAL LOW (ref 8.9–10.3)
Chloride: 97 mmol/L — ABNORMAL LOW (ref 98–111)
Creatinine, Ser: 1.02 mg/dL (ref 0.61–1.24)
GFR, Estimated: >60 mL/min
Glucose, Bld: 110 mg/dL — ABNORMAL HIGH (ref 70–99)
Potassium: 4.2 mmol/L (ref 3.5–5.1)
Sodium: 132 mmol/L — ABNORMAL LOW (ref 135–145)
Total Bilirubin: 0.9 mg/dL (ref 0.3–1.2)
Total Protein: 7.3 g/dL (ref 6.5–8.1)

## 2022-10-09 LAB — CBC WITH DIFFERENTIAL/PLATELET
Abs Immature Granulocytes: 0.07 K/uL (ref 0.00–0.07)
Basophils Absolute: 0.1 K/uL (ref 0.0–0.1)
Basophils Relative: 1 %
Eosinophils Absolute: 0.7 K/uL — ABNORMAL HIGH (ref 0.0–0.5)
Eosinophils Relative: 5 %
HCT: 49.3 % (ref 39.0–52.0)
Hemoglobin: 16.2 g/dL (ref 13.0–17.0)
Immature Granulocytes: 1 %
Lymphocytes Relative: 13 %
Lymphs Abs: 1.6 K/uL (ref 0.7–4.0)
MCH: 30 pg (ref 26.0–34.0)
MCHC: 32.9 g/dL (ref 30.0–36.0)
MCV: 91.3 fL (ref 80.0–100.0)
Monocytes Absolute: 1.1 K/uL — ABNORMAL HIGH (ref 0.1–1.0)
Monocytes Relative: 9 %
Neutro Abs: 9.3 K/uL — ABNORMAL HIGH (ref 1.7–7.7)
Neutrophils Relative %: 71 %
Platelets: 412 K/uL — ABNORMAL HIGH (ref 150–400)
RBC: 5.4 MIL/uL (ref 4.22–5.81)
RDW: 15.5 % (ref 11.5–15.5)
WBC: 12.9 K/uL — ABNORMAL HIGH (ref 4.0–10.5)
nRBC: 0 % (ref 0.0–0.2)

## 2022-10-09 LAB — RESP PANEL BY RT-PCR (RSV, FLU A&B, COVID)  RVPGX2
Influenza A by PCR: NEGATIVE
Influenza B by PCR: NEGATIVE
Resp Syncytial Virus by PCR: NEGATIVE
SARS Coronavirus 2 by RT PCR: NEGATIVE

## 2022-10-09 LAB — QUANTIFERON-TB GOLD PLUS
Mitogen-NIL: 7.52 IU/mL
NIL: 0.17 IU/mL
QuantiFERON-TB Gold Plus: NEGATIVE
TB1-NIL: 0.03 IU/mL
TB2-NIL: 0 IU/mL

## 2022-10-09 LAB — URINALYSIS, ROUTINE W REFLEX MICROSCOPIC
Bilirubin Urine: NEGATIVE
Glucose, UA: NEGATIVE mg/dL
Hgb urine dipstick: NEGATIVE
Ketones, ur: NEGATIVE mg/dL
Leukocytes,Ua: NEGATIVE
Nitrite: NEGATIVE
Protein, ur: NEGATIVE mg/dL
Specific Gravity, Urine: 1.013 (ref 1.005–1.030)
pH: 6 (ref 5.0–8.0)

## 2022-10-09 LAB — LACTIC ACID, PLASMA
Lactic Acid, Venous: 0.8 mmol/L (ref 0.5–1.9)
Lactic Acid, Venous: 0.9 mmol/L (ref 0.5–1.9)

## 2022-10-09 MED ORDER — IOHEXOL 300 MG/ML  SOLN
100.0000 mL | Freq: Once | INTRAMUSCULAR | Status: AC | PRN
  Administered 2022-10-09: 100 mL via INTRAVENOUS

## 2022-10-09 NOTE — ED Triage Notes (Signed)
Reports not feeling well since January. Chest xray last week normal with pcp. A&O x4 upon arrival  C/o weakness, body aches,fever, stool changes since January. Reports feeling weak today when trying to get out of bed so he called EMS. Also c/o nightsweats intermittently X1 month.  EMS vitals: 160/109 b/p 98% RA 114 blood sugar 98.6 oral

## 2022-10-09 NOTE — ED Provider Notes (Signed)
[facility] Provider Note    Event Date/Time   First MD Initiated Contact with Patient 10/09/22 1504     (approximate)   History   No chief complaint on file.   HPI  TERREN KALLSTROM is a 77 y.o. male with a history of atrial fibrillation, diverticulitis, depression, anxiety, GERD, and IBS who presents with generalized fatigue and weakness for approximate the last 3 weeks, persistent course, associated with fevers at night which have been gradually increasing.  The patient states that last night he had a fever to 101 during the night.  He woke up and noted that his heart was racing and he felt weak.  He reports some generalized bodyaches.  He has also had some intermittent diarrhea but no abdominal pain or vomiting.  The patient states that he had diverticulitis several years ago that presented similarly with more generalized symptoms and fever rather than any abdominal pain.  He saw his doctor recently and had several tests sent off.  He states that he has not been tested for flu or COVID in several weeks.  The past medical records.  The patient saw his primary care provider Dr. Damita Dunnings on 2/16 for fevers, sweats, and fatigue.  He had BMP, BNP, CBC, UA, ESR, CK, RMSF antibodies, and blood cultures at that time.  Quite a few on, B. Burgdorfi antibody and pathologist smear review are all pending still.   Physical Exam   Triage Vital Signs: ED Triage Vitals  Enc Vitals Group     BP --      Pulse --      Resp --      Temp 10/09/22 1246 99.6 F (37.6 C)     Temp Source 10/09/22 1246 Oral     SpO2 --      Weight 10/09/22 1247 175 lb (79.4 kg)     Height 10/09/22 1247 5' 9"$  (1.753 m)     Head Circumference --      Peak Flow --      Pain Score 10/09/22 1247 0     Pain Loc --      Pain Edu? --      Excl. in [place]? --     Most recent vital signs: Vitals:   10/09/22 1630 10/09/22 1652  BP: (!) 147/102   Pulse: 72   Resp: 14   Temp:  98.6 F (37 C)  SpO2:  97%      General: Alert and oriented, well-appearing. CV:  Good peripheral perfusion.  Normal heart sounds. Resp:  Normal effort.  Lungs CTAB. Abd:  Soft and nontender.  No distention.  Other:  Moist mucous membranes.   ED Results / Procedures / Treatments   Labs (all labs ordered are listed, but only abnormal results are displayed) Labs Reviewed  COMPREHENSIVE METABOLIC PANEL - Abnormal; Notable for the following components:      Result Value   Sodium 132 (*)    Chloride 97 (*)    Glucose, Bld 110 (*)    Calcium 8.7 (*)    All other components within normal limits  CBC WITH DIFFERENTIAL/PLATELET - Abnormal; Notable for the following components:   WBC 12.9 (*)    Platelets 412 (*)    Neutro Abs 9.3 (*)    Monocytes Absolute 1.1 (*)    Eosinophils Absolute 0.7 (*)    All other components within normal limits  URINALYSIS, ROUTINE W REFLEX MICROSCOPIC - Abnormal; Notable for the following components:  Color, Urine YELLOW (*)    APPearance CLEAR (*)    All other components within normal limits  RESP PANEL BY RT-PCR (RSV, FLU A&B, COVID)  RVPGX2  LACTIC ACID, PLASMA  LACTIC ACID, PLASMA     EKG     RADIOLOGY  Chest x-ray: I independently viewed and interpreted the images; there is no focal consolidation or edema  PROCEDURES:  Critical Care performed: No  Procedures   MEDICATIONS ORDERED IN ED: Medications  iohexol (OMNIPAQUE) 300 MG/ML solution 100 mL (100 mLs Intravenous Contrast Given 10/09/22 1707)     IMPRESSION / MDM / ASSESSMENT AND PLAN / ED COURSE  I reviewed the triage vital signs and the nursing notes.  77 year old male with PMH as noted above presents with generalized fatigue and weakness as well as intermittent fevers and night sweats over the last several weeks.  He reports some loose stools as well but denies other focal symptoms.  Physical exam is unremarkable.  The patient is well-appearing with normal vital signs here and no other focal  findings.  He has had significant workup performed in the last several days by his PMD.  Differential diagnosis includes, but is not limited to, influenza, COVID-19, or other viral syndrome, UTI, diverticulitis or other intra-abdominal process.  The patient negative blood cultures within the last week.  With the history of a similar presentation previously we will obtain CT to rule out diverticulitis as well as labs.  Patient's presentation is most consistent with acute presentation with potential threat to life or bodily function.  The patient is on the cardiac monitor to evaluate for evidence of arrhythmia and/or significant heart rate changes.  ----------------------------------------- 6:40 PM on 10/09/2022 -----------------------------------------  CT shows no acute findings.  Respiratory panel is negative.  CBC shows mild leukocytosis which appears chronic for the patient.  Electrolytes are unremarkable.  Lactate is negative x 2.  Urinalysis is negative.  The patient has remained afebrile with stable vital signs in the ED.  He is asymptomatic at this time.  I did consider whether the patient may benefit from inpatient admission due to recurrent fevers of unknown origin.  However given the subacute nature of the symptoms, his normal vital signs and reassuring workup in the ED, negative recent blood cultures, and his close follow-up with his PMD who is also actively working this up, he is stable for discharge home with continued outpatient workup.  The patient agrees with this plan and feels well to go home.  I counseled him on the results of the workup and emphasized the importance of following up with his PMD this week.  I gave strict return precautions and he expressed understanding.  FINAL CLINICAL IMPRESSION(S) / ED DIAGNOSES   Final diagnoses:  Febrile illness     Rx / DC Orders   ED Discharge Orders     None        Note:  This document was prepared using Dragon voice  recognition software and may include unintentional dictation errors.    Arta Silence, MD 10/09/22 2045

## 2022-10-09 NOTE — Assessment & Plan Note (Signed)
Discussed fever of unknown origin as a possible explanation. Tylenol as needed for fevers.  Rest and fluids.   See notes on labs.  Check echo to exclude vegetation.  No murmur heard.  At this point still okay for outpatient follow-up.  He agrees with plan.  Broad differential discussed with patient.  Routine cautions given to patient.  Nontoxic.

## 2022-10-09 NOTE — Discharge Instructions (Signed)
You may continue to take Tylenol as needed for fever.  The maximum dose is 4 g daily, or 1000 mg every 6 hours.  Follow-up with your primary care doctor this week as discussed.  Return to the ER for new, worsening, or persistent severe fever, weakness, palpitations or heart racing, feel like you are going to pass out, vomiting or diarrhea, difficulty breathing, rash, or any other new or worsening symptoms that concern you.

## 2022-10-10 ENCOUNTER — Telehealth: Payer: Self-pay | Admitting: Family Medicine

## 2022-10-10 ENCOUNTER — Other Ambulatory Visit: Payer: Self-pay | Admitting: Radiology

## 2022-10-10 ENCOUNTER — Telehealth: Payer: Self-pay

## 2022-10-10 DIAGNOSIS — R509 Fever, unspecified: Secondary | ICD-10-CM

## 2022-10-10 LAB — URINE CULTURE
MICRO NUMBER:: 14576260
Result:: NO GROWTH
SPECIMEN QUALITY:: ADEQUATE

## 2022-10-10 LAB — PATHOLOGIST SMEAR REVIEW

## 2022-10-10 LAB — ROCKY MTN SPOTTED FVR ABS PNL(IGG+IGM)
RMSF IgG: NOT DETECTED
RMSF IgM: NOT DETECTED

## 2022-10-10 LAB — B. BURGDORFI ANTIBODIES: B burgdorferi Ab IgG+IgM: <0.9 index

## 2022-10-10 NOTE — Telephone Encounter (Signed)
Patient awaiting echocardiogram Went to ED yesterday, but hasn't heard from anyone about the appointment  [phone]  at home all day today  [phone] (Cell)

## 2022-10-10 NOTE — Telephone Encounter (Signed)
Order was just placed for echo on Thursday. I advised patient last week that this can take up to a week and will get a call sometime this week.

## 2022-10-10 NOTE — Transitions of Care (Post Inpatient/ED Visit) (Signed)
10/10/2022  Name: Mason [name] MRN: [id number] DOB: 1945/12/08  Today's TOC FU Call Status: Today's TOC FU Call Status:: Successful TOC FU Call Competed TOC FU Call Complete Date: 10/10/22  Transition Care Management Follow-up Telephone Call Date of Discharge: 10/09/22 [facility] Type of Discharge: Emergency Department Reason for ED Visit: Other: (Febrile illness) How have you been since you were released from the hospital?: Better Any questions or concerns?: No  Items Reviewed: Did you receive and understand the discharge instructions provided?: Yes Medications obtained and verified?: Yes (Medications Reviewed) Any new allergies since your discharge?: No Dietary orders reviewed?: NA Do you have support at home?: Yes  Home Care and Equipment/Supplies: Were Home Health Services Ordered?: NA Any new equipment or medical supplies ordered?: NA  Functional Questionnaire: Do you need assistance with bathing/showering or dressing?: No Do you need assistance with meal preparation?: No Do you need assistance with eating?: No Do you have difficulty maintaining continence: No Do you need assistance with getting out of bed/getting out of a chair/moving?: No Do you have difficulty managing or taking your medications?: No  Folllow up appointments reviewed: PCP Follow-up appointment confirmed?: No (refused) MD Provider Line [phone] Given: Yes Specialist Hospital Follow-up appointment confirmed?: NA Do you need transportation to your follow-up appointment?: No Do you understand care options if your condition(s) worsen?: Yes-patient verbalized understanding    SIGNATURE  Woodfin Ganja LPN [facility] Nurse Health Advisor Direct Dial [phone]

## 2022-10-11 ENCOUNTER — Telehealth: Payer: Self-pay

## 2022-10-11 LAB — FECAL OCCULT BLOOD, IMMUNOCHEMICAL: Fecal Occult Bld: POSITIVE — AB

## 2022-10-11 NOTE — Telephone Encounter (Signed)
CRITICAL VALUE STICKER  CRITICAL VALUE: Pos I fob   RECEIVER (on-site recipient of call): Anh Bigos   DATE & TIME NOTIFIED: 10/11/22 at 7:35 am     MESSENGER (representative from lab): Cecille Rubin  MD NOTIFIED:  Dr. Darnell Level shown due to Dr. Damita Dunnings out of office today   TIME OF NOTIFICATION: 10/11/22 at 8:02  RESPONSE:  will await review of message and direction from provider.

## 2022-10-11 NOTE — Telephone Encounter (Signed)
TCM call was done yesterday with patient. See other TE note.

## 2022-10-11 NOTE — Telephone Encounter (Signed)
Spoke with pt relaying results and Dr. Synthia Innocent message. Pt verbalizes understanding. Denies any blood in stool or hemorrhoid issues. Says he sometimes has blood when he wipes after a bout with constipation. Also, denies dizziness, SOB, HA or diarrhea.

## 2022-10-11 NOTE — Telephone Encounter (Signed)
Please call - stool test returned positive for hidden blood, however recent blood count reassuringly without anemia (Hgb 16).  Any signs of blood in stool or any known hemorrhoid issue? Is he having any dizziness, shortness of breath or headache? Any diarrhea? Had reassuring CT abd/pelvis while at ER on Sunday.  I'm not sure that the blood in stool is related to how he's been feeling (fever, chills, night sweats) but GI referral may be in his future.

## 2022-10-11 NOTE — Telephone Encounter (Signed)
Dr. Damita Dunnings is out of the office. This is the same patient that I have sent the positive I fob results to you for review this morning.

## 2022-10-11 NOTE — Telephone Encounter (Signed)
See phone note

## 2022-10-12 ENCOUNTER — Other Ambulatory Visit: Payer: Self-pay | Admitting: Family Medicine

## 2022-10-12 DIAGNOSIS — R509 Fever, unspecified: Secondary | ICD-10-CM

## 2022-10-12 LAB — CULTURE, BLOOD (SINGLE)
MICRO NUMBER:: 14576267
Result:: NO GROWTH
SPECIMEN QUALITY:: ADEQUATE

## 2022-10-12 NOTE — Telephone Encounter (Signed)
Patient called and said that someone had called him and said that Dr Damita Dunnings wanted the echo done stat and that somewhere could get him in tomorrow morning at the [facility] street location, I didn't see anything on there so I called and they told me the soonest they could do was 10/31/22 at 3pm. I told them to schedule because it was sooner than what the patient had scheduled on 11/10/22. Patient is aware and said if Dr Damita Dunnings would like him to do something sooner to just let him know. I will call tomorrow to cancel his other

## 2022-10-12 NOTE — Telephone Encounter (Signed)
See result note.  

## 2022-10-13 NOTE — Telephone Encounter (Signed)
Please check with patient about getting him scheduled.  Thanks.

## 2022-10-14 ENCOUNTER — Ambulatory Visit (HOSPITAL_COMMUNITY): Payer: Medicare Other | Attending: Family Medicine

## 2022-10-14 DIAGNOSIS — R509 Fever, unspecified: Secondary | ICD-10-CM | POA: Diagnosis present

## 2022-10-14 LAB — ECHOCARDIOGRAM COMPLETE
Area-P 1/2: 1.92 cm2
MV M vel: 2.03 m/s
MV Peak grad: 16.5 mmHg
S' Lateral: 2.8 cm

## 2022-10-14 NOTE — Telephone Encounter (Signed)
Many thanks. Will await echo report.

## 2022-10-14 NOTE — Telephone Encounter (Signed)
I called Cardiology [address] and spoke with Edmon Crape regarding the Echo that was scheduled for 10/13/22 at 10:15. Patient was made aware to go to this appt, I spoke with the patient on the phone 2/21 and gave him this appt information.   Somehow his appt got cancelled and rescheduled to 10/31/2022.   All my notes documented on the appt desk have been deleted also by someone.   Per notes that I sent Dr Damita Dunnings 10/12/22 when trying to get this scheduled  [Wednesday 11:35 AM] Varney Daily According to the [facility] Cardiopulmonary Dept STAT Echos need to go to the ED First available is 11/10/2022 at [facility]   I can try Zacarias Pontes  [Wednesday 11:49 AM] Fabio Neighbors [facility] does not schedule STAT Echos (ED Protocol)  I was able to get him worked in with [facility] office tomorrow 10/13/22 at 10:15am Patient is aware of when where to go tomorrow   Edmon Crape is calling the patient to discuss issues with the appt, she is seeing if he is able to come today 10/14/22 around 1pm.   Sending back to Dr Damita Dunnings as Juluis Rainier and also tagging Edmon Crape for documentation.

## 2022-10-17 ENCOUNTER — Ambulatory Visit (INDEPENDENT_AMBULATORY_CARE_PROVIDER_SITE_OTHER): Payer: Medicare Other | Admitting: Family Medicine

## 2022-10-17 ENCOUNTER — Encounter: Payer: Self-pay | Admitting: Family Medicine

## 2022-10-17 VITALS — BP 134/82 | HR 66 | Temp 97.2°F | Ht 69.0 in | Wt 173.0 lb

## 2022-10-17 DIAGNOSIS — R509 Fever, unspecified: Secondary | ICD-10-CM | POA: Diagnosis not present

## 2022-10-17 NOTE — Progress Notes (Unsigned)
Still with occ fever up to 101.  He didn't have as much sweating last night- that was an improvement.  Last fever was likely last night.    He had a dental implant prev done. R upper molar.  No pain there now.  No discharge.  No swelling.    D/w pt about prev positive IFOB.  He can have blood from irritation after cleaning up.    Cough is variable, he can have a period of time w/o sx.  Cough improves with blowing his nose.     R upper tooth implant site w/o swelling or erythema.     CT max/facial GI f/u Observation.      RRR nor murmur

## 2022-10-17 NOTE — Patient Instructions (Signed)
Let me update the GI clinic.  It may be reasonable to repeat your stool card.   Let me know how you are feeling in about 2-3 days.   If not better, then consider CT max/face.   Take care.  Glad to see you.

## 2022-10-19 ENCOUNTER — Telehealth: Payer: Self-pay | Admitting: Family Medicine

## 2022-10-19 DIAGNOSIS — R509 Fever, unspecified: Secondary | ICD-10-CM

## 2022-10-19 NOTE — Assessment & Plan Note (Signed)
FUO w/u to this point d/w pt.  He feels some better in the meantime.  We agreed to give him short interval to see if his symptoms continue to spontaneously improve.  I asked him to let me know how he is feeling in about 2-3 days.  If not better, then consider CT max/face.  Unclear if his dental implant is a source for his fevers.  He does not have obvious dental symptoms at this point.  I will update the GI clinic about his positive Ifob.  30 minutes were devoted to patient care in this encounter (this includes time spent reviewing the patient's file/history, interviewing and examining the patient, counseling/reviewing plan with patient).

## 2022-10-19 NOTE — Telephone Encounter (Signed)
I need your input on this patient.  He is in the midst of fever of unknown origin workup.  He had a positive IFOB.  He does not have abdominal symptoms and he has relatively recent colonoscopy and EGD.  He is also anticoagulated with Xarelto.  His fevers may be spontaneously improving.  His workup has been unremarkable otherwise.  We agreed to give him a short interval to see if his symptoms continue to improve, at least in terms of his fevers.  My question is about management of his positive IFOB.  I did not think it made sense for him to have repeat endoscopic evaluation right now and it may make sense to repeat his IFOB later on.  I would like your input.  Thanks.

## 2022-10-20 NOTE — Telephone Encounter (Signed)
Reviewed. Hemoglobin normal.   Do you know why fecal occult blood test checked? Most recent colonoscopy and upper endoscopy reviewed. Given his age, comorbidities, and no evidence for GI bleeding or anemia, I would probably favor addressing his primary issue of fever (as you are).  If he is not on a daily PPI, this would be reasonable given his age, comorbidities, and the need for chronic anticoagulation (for upper GI mucosal protection). I appreciate you reaching out.  Feel free to do the same at any time. Thanks

## 2022-10-23 NOTE — Telephone Encounter (Signed)
Thank you for your help.  I was checking his IFOB as part of his fever of unknown origin workup, in an attempt to eliminate a bowel issue as a possible etiology.  He is on a PPI in the meantime and I will update him about your message.

## 2022-10-23 NOTE — Telephone Encounter (Signed)
See below and please update patient.  Would continue Nexium as is.  Please verify that he is taking that.  Since he is not anemic I would defer repeat endoscopy at this point.  I think it makes sense to see how he does in terms of his fevers in the near future.  Please let me know about that.  Thanks.

## 2022-10-24 NOTE — Telephone Encounter (Addendum)
Called patient reviewed all information and repeated back to me. Will call if any questions.  He has been taking Nexium. He will hold off on the endoscopy at this time.  Fevers have died down some.  Wanted to know if you wanted to move forward with CT or hold off for now.

## 2022-10-25 ENCOUNTER — Telehealth: Payer: Self-pay | Admitting: Family Medicine

## 2022-10-25 ENCOUNTER — Encounter: Payer: Self-pay | Admitting: *Deleted

## 2022-10-25 NOTE — Telephone Encounter (Signed)
I would get the CT done. Ordered. Let me know if he has trouble with scheduling.  Thanks.

## 2022-10-25 NOTE — Telephone Encounter (Signed)
Called patient and reviewed all information. Patient verbalized understanding.  Will call if having issues getting scheduled.

## 2022-10-25 NOTE — Telephone Encounter (Signed)
Pt came in to drop off [place] of attorney paperwork  for PCP to review . It was place in PCP folder

## 2022-10-25 NOTE — Telephone Encounter (Signed)
PPW has been given to Dr. Damita Dunnings for review.

## 2022-10-25 NOTE — Addendum Note (Signed)
Addended by: Tonia Ghent on: 10/25/2022 06:57 AM   Modules accepted: Orders

## 2022-10-31 ENCOUNTER — Other Ambulatory Visit (HOSPITAL_COMMUNITY): Payer: Medicare Other

## 2022-11-02 ENCOUNTER — Ambulatory Visit
Admission: RE | Admit: 2022-11-02 | Discharge: 2022-11-02 | Disposition: A | Discharge status: Home / Self Care | Payer: Medicare Other | Source: Ambulatory Visit | Attending: Family Medicine | Admitting: Family Medicine

## 2022-11-02 DIAGNOSIS — R509 Fever, unspecified: Secondary | ICD-10-CM

## 2022-11-10 ENCOUNTER — Ambulatory Visit: Payer: Medicare Other

## 2022-11-11 ENCOUNTER — Other Ambulatory Visit: Payer: Self-pay | Admitting: Family Medicine

## 2022-11-11 DIAGNOSIS — Z125 Encounter for screening for malignant neoplasm of prostate: Secondary | ICD-10-CM

## 2022-11-11 DIAGNOSIS — E786 Lipoprotein deficiency: Secondary | ICD-10-CM

## 2022-11-11 DIAGNOSIS — R7989 Other specified abnormal findings of blood chemistry: Secondary | ICD-10-CM

## 2022-11-23 ENCOUNTER — Other Ambulatory Visit: Payer: Self-pay | Admitting: Family Medicine

## 2022-11-24 ENCOUNTER — Other Ambulatory Visit (INDEPENDENT_AMBULATORY_CARE_PROVIDER_SITE_OTHER): Payer: Medicare Other

## 2022-11-24 DIAGNOSIS — Z125 Encounter for screening for malignant neoplasm of prostate: Secondary | ICD-10-CM | POA: Diagnosis not present

## 2022-11-24 DIAGNOSIS — R7989 Other specified abnormal findings of blood chemistry: Secondary | ICD-10-CM | POA: Diagnosis not present

## 2022-11-24 DIAGNOSIS — E786 Lipoprotein deficiency: Secondary | ICD-10-CM

## 2022-11-24 LAB — LIPID PANEL
Cholesterol: 142 mg/dL (ref 0–200)
HDL: 38.4 mg/dL — ABNORMAL LOW (ref >39.00)
LDL Cholesterol: 82 mg/dL (ref 0–99)
NonHDL: 103.95
Total CHOL/HDL Ratio: 4
Triglycerides: 111 mg/dL (ref 0.0–149.0)
VLDL: 22.2 mg/dL (ref 0.0–40.0)

## 2022-11-24 LAB — CBC WITH DIFFERENTIAL/PLATELET
Basophils Absolute: 0.1 10*3/uL (ref 0.0–0.1)
Basophils Relative: 1.2 % (ref 0.0–3.0)
Eosinophils Absolute: 0.4 10*3/uL (ref 0.0–0.7)
Eosinophils Relative: 3.7 % (ref 0.0–5.0)
HCT: 46.9 % (ref 39.0–52.0)
Hemoglobin: 15.5 g/dL (ref 13.0–17.0)
Lymphocytes Relative: 17 % (ref 12.0–46.0)
Lymphs Abs: 1.9 10*3/uL (ref 0.7–4.0)
MCHC: 33 g/dL (ref 30.0–36.0)
MCV: 91.9 fl (ref 78.0–100.0)
Monocytes Absolute: 0.6 10*3/uL (ref 0.1–1.0)
Monocytes Relative: 5.7 % (ref 3.0–12.0)
Neutro Abs: 8 10*3/uL — ABNORMAL HIGH (ref 1.4–7.7)
Neutrophils Relative %: 72.4 % (ref 43.0–77.0)
Platelets: 421 10*3/uL — ABNORMAL HIGH (ref 150.0–400.0)
RBC: 5.1 Mil/uL (ref 4.22–5.81)
RDW: 17.1 % — ABNORMAL HIGH (ref 11.5–15.5)
WBC: 11.1 10*3/uL — ABNORMAL HIGH (ref 4.0–10.5)

## 2022-11-24 LAB — BASIC METABOLIC PANEL
BUN: 13 mg/dL (ref 6–23)
CO2: 31 mEq/L (ref 19–32)
Calcium: 9.1 mg/dL (ref 8.4–10.5)
Chloride: 99 mEq/L (ref 96–112)
Creatinine, Ser: 0.93 mg/dL (ref 0.40–1.50)
GFR: 78.78 mL/min (ref >60.00)
Glucose, Bld: 93 mg/dL (ref 70–99)
Potassium: 4.8 mEq/L (ref 3.5–5.1)
Sodium: 138 mEq/L (ref 135–145)

## 2022-11-24 LAB — PSA, MEDICARE: PSA: 4.66 ng/ml — ABNORMAL HIGH (ref 0.10–4.00)

## 2022-12-01 ENCOUNTER — Encounter: Payer: Self-pay | Admitting: Family Medicine

## 2022-12-01 ENCOUNTER — Ambulatory Visit (INDEPENDENT_AMBULATORY_CARE_PROVIDER_SITE_OTHER): Payer: Medicare Other | Admitting: Family Medicine

## 2022-12-01 VITALS — BP 118/78 | HR 65 | Temp 98.0°F | Ht 69.0 in | Wt 175.0 lb

## 2022-12-01 DIAGNOSIS — R972 Elevated prostate specific antigen [PSA]: Secondary | ICD-10-CM

## 2022-12-01 DIAGNOSIS — I4891 Unspecified atrial fibrillation: Secondary | ICD-10-CM

## 2022-12-01 DIAGNOSIS — K219 Gastro-esophageal reflux disease without esophagitis: Secondary | ICD-10-CM | POA: Diagnosis not present

## 2022-12-01 DIAGNOSIS — Z7189 Other specified counseling: Secondary | ICD-10-CM

## 2022-12-01 DIAGNOSIS — Z0000 Encounter for general adult medical examination without abnormal findings: Secondary | ICD-10-CM | POA: Diagnosis not present

## 2022-12-01 DIAGNOSIS — Z125 Encounter for screening for malignant neoplasm of prostate: Secondary | ICD-10-CM

## 2022-12-01 DIAGNOSIS — R0982 Postnasal drip: Secondary | ICD-10-CM | POA: Diagnosis not present

## 2022-12-01 MED ORDER — AZELASTINE HCL 0.1 % NA SOLN: 1.0000 | Freq: Two times a day (BID) | NASAL | 12 refills | Status: DC

## 2022-12-01 NOTE — Progress Notes (Signed)
I have personally reviewed the Medicare Annual Wellness questionnaire and have noted 1. The patient's medical and social history 2. Their use of alcohol, tobacco or illicit drugs 3. Their current medications and supplements 4. The patient's functional ability including ADL's, fall risks, home safety risks and hearing or visual             impairment. 5. Diet and physical activities 6. Evidence for depression or mood disorders  The patients weight, height, BMI have been recorded in the chart and visual acuity is per eye clinic.  I have made referrals, counseling and provided education to the patient based review of the above and I have provided the pt with a written personalized care plan for preventive services.  Provider list updated- see scanned forms.  Routine anticipatory guidance given to patient.  See health maintenance. The possibility exists that previously documented standard health maintenance information may have been brought forward from a previous encounter into this note.  If needed, that same information has been updated to reflect the current situation based on today's encounter.    Flu 2023 Shingles previously done PNA previously done Tetanus 2023 COVID-vaccine discussed with patient. RSV vaccine discussed with patient. Colonoscopy 2021 PSA mildly elevated 2024, d/w pt.  Slightly slower stream occ.  H/o similar elevation in the past that self resolved.   BID flomax helped with LUTS. Advance directive- son designated if patient were incapacitated.   Cognitive function addressed- see scanned forms- and if abnormal then additional documentation follows.   In addition to [facility], follow up visit for the below conditions:  GERD controlled with QD, rare BID, PPI dosing.  Swallowing well.  No adverse effect on medication.  Fevers/sweats d/w pt.  Minimal occ dampness at night but it is clearly better from prior.  Not waking up soaked . He is clearly better.  Fatigue is  better.  Agreed that he will update me as needed.  Anticoagulated.  H/o AF.  No CP.  Not SOB.  No bleeding.  Still seeing cardiology at baseline.    Post nasal gtt clearly better with azelastine 0.1%. refill sent.   PMH and SH reviewed  Meds, vitals, and allergies reviewed.   ROS: Per HPI.  Unless specifically indicated otherwise in HPI, the patient denies:  General: fever. Eyes: acute vision changes ENT: sore throat Cardiovascular: chest pain Respiratory: SOB GI: vomiting GU: dysuria Musculoskeletal: acute back pain Derm: acute rash Neuro: acute motor dysfunction Psych: worsening mood Endocrine: polydipsia Heme: bleeding Allergy: hayfever  GEN: nad, alert and oriented HEENT: ncat NECK: supple w/o LA CV: rrr. PULM: ctab, no inc wob ABD: soft, +bs EXT: no edema SKIN: no acute rash

## 2022-12-01 NOTE — Patient Instructions (Addendum)
Recheck PSA in about 2-3 months.  Nonfasting lab visit.  Update me as needed.  Take care.  Glad to see you.

## 2022-12-04 NOTE — Assessment & Plan Note (Signed)
No bleeding.  Would continue Xarelto as is.  He is on follow-up with cardiology.  Not having chest pain.

## 2022-12-04 NOTE — Assessment & Plan Note (Signed)
Advance directive- son designated if patient were incapacitated.   

## 2022-12-04 NOTE — Assessment & Plan Note (Signed)
Continue azelastine

## 2022-12-04 NOTE — Assessment & Plan Note (Signed)
GERD controlled with QD, rare BID, PPI dosing.  Swallowing well.  No adverse effect on medication.

## 2022-12-04 NOTE — Assessment & Plan Note (Signed)
Flu 2023 Shingles previously done PNA previously done Tetanus 2023 COVID-vaccine discussed with patient. RSV vaccine discussed with patient. Colonoscopy 2021 PSA mildly elevated 2024, d/w pt.  Slightly slower stream occ.  H/o similar elevation in the past that self resolved.   BID flomax helped with LUTS. Advance directive- son designated if patient were incapacitated.   Cognitive function addressed- see scanned forms- and if abnormal then additional documentation follows.

## 2022-12-04 NOTE — Assessment & Plan Note (Signed)
PSA mildly elevated 2024, d/w pt.  Slightly slower stream occ.  H/o similar elevation in the past that self resolved.   BID flomax helped with LUTS.   Plan is to recheck PSA in about 2-3 months.  Nonfasting lab visit.

## 2022-12-19 ENCOUNTER — Other Ambulatory Visit: Payer: Self-pay | Admitting: Pharmacist

## 2022-12-19 DIAGNOSIS — I480 Paroxysmal atrial fibrillation: Secondary | ICD-10-CM

## 2022-12-19 MED ORDER — RIVAROXABAN 20 MG PO TABS: 20.0000 mg | ORAL_TABLET | Freq: Every day | ORAL | 0 refills | Status: DC

## 2022-12-19 NOTE — Telephone Encounter (Signed)
Prescription refill request for Xarelto received.  Indication: a fib Last office visit: 10/14/21 overdue Weight: 175 Age: 77 Scr: 0.93 11/24/22 epic CrCl: 73 ml/min  Will message scheduling

## 2022-12-30 ENCOUNTER — Encounter: Payer: Self-pay | Admitting: Cardiology

## 2022-12-30 ENCOUNTER — Ambulatory Visit: Payer: Medicare Other | Attending: Cardiology | Admitting: Cardiology

## 2022-12-30 VITALS — BP 138/90 | HR 66 | Ht 69.5 in | Wt 178.4 lb

## 2022-12-30 DIAGNOSIS — I81 Portal vein thrombosis: Secondary | ICD-10-CM

## 2022-12-30 DIAGNOSIS — I480 Paroxysmal atrial fibrillation: Secondary | ICD-10-CM

## 2022-12-30 MED ORDER — RIVAROXABAN 20 MG PO TABS: 20.0000 mg | ORAL_TABLET | Freq: Every day | ORAL | 3 refills | Status: DC

## 2022-12-30 NOTE — Progress Notes (Signed)
Cardiology Office Note:    Date:  12/30/2022   ID:  Mason [name], Mason [name] [birth year], MRN [id number]  PCP:  Joaquim Nam, MD  Cardiologist:  Debbe Odea, MD  Electrophysiologist:  None   Referring MD: Joaquim Nam, MD   Chief Complaint  Patient presents with   Follow-up    Patient denies new or acute cardiac problems/concerns today.  Refill request for Xarelto pended.     History of Present Illness:    Mason [name] is a 77 y.o. male with a hx of paroxysmal atrial fibrillation, hypertension, portal venous thrombosis, anxiety who presents for follow-up.  Feels well, tolerating Xarelto with no bleeding issues.  States feeling sick 3 months ago with symptoms of sweatiness.  Workup with PCP was unrevealing.  Towards the end of him being feeling ill, he noticed some irregular heartbeats which has since resolved.  Denies palpitations, chest pain or shortness of breath.  Prior notes Echo 09/2022 EF 55 to 60% History of dizziness with Toprol-XL. Patient has history of abdominal discomfort, abdominal imaging with CT abdomen pelvis with contrast revealed acute diverticulitis and portal vein thrombosis.  He was started on antibiotics and Eliquis.  He was seen by GI and hematology.  Anticoagulation for 3 to 6 months was recommended.  Patient later on noticed to have intermittent tachycardia on telemetry.   EKG on 09/20/2019 showed atrial fibrillation with heart rate 109.  Subsequent ECGs showed sinus rhythm.  Echocardiogram obtained on 08/2019 showed low normal ejection fraction of 50 to 55%, left and right atrial sizes were normal.  Past Medical History:  Diagnosis Date   Actinic keratosis 04/02/2013   L sup scapula - bx proven   Actinic keratosis 04/02/2013   R med ant deltoid - bx proven   Allergy    Anal fissure    Anxiety    Basal cell carcinoma    back - treated in the past    Basal cell carcinoma 05/02/2009   left supratip, Mohs   Cancer (HCC)    basal cell  removed x4   Cataract    bil eyes   Depression    Diverticulosis of colon    GERD (gastroesophageal reflux disease)    esophageal narrowing   Glaucoma    Hiatal hernia    IBS (irritable bowel syndrome)    Insomnia    Internal hemorrhoids     Past Surgical History:  Procedure Laterality Date   ANAL SPHINCTEROTOMY  03/1995   fistulotomy   APPENDECTOMY     BASAL CELL CARCINOMA EXCISION     on back   BICEPT TENODESIS Right 09/27/2004   decompression   COLONOSCOPY  04/1999   internal hemmroids,12-11-08 colon tics and hems only    ESOPHAGOGASTRODUODENOSCOPY  04/1999   with esophagitis and stricture   MOHS SURGERY  09/02/2008   L supratip of nose with flap,basal cell cancer [facility]/MOHS   MOUTH SURGERY  01/2020   TONGUE SURGERY  2017   had small place removed, noncancerous   TONSILLECTOMY AND ADENOIDECTOMY     remote   UPPER GASTROINTESTINAL ENDOSCOPY     WRIST SURGERY Right    WRIST SURGERY     scar tissue removed    Current Medications: Current Meds  Medication Sig   azelastine (ASTELIN) 0.1 % nasal spray Place 1 spray into both nostrils 2 (two) times daily. Use in each nostril as directed   b complex vitamins capsule Take 1 capsule by mouth daily.   bimatoprost (  LUMIGAN) 0.01 % SOLN Apply 1 drop to eye daily.   Cholecalciferol (VITAMIN D) 125 MCG (5000 UT) CAPS Take 1 tablet by mouth daily.   esomeprazole (NEXIUM) 40 MG capsule Take 1 capsule (40 mg total) by mouth 2 (two) times daily.   levocetirizine (XYZAL) 5 MG tablet TAKE 1 TABLET BY MOUTH EVERY DAY IN THE EVENING   mometasone (ELOCON) 0.1 % cream Apply to affected skin on the neck qd-bid prn irritation   tamsulosin (FLOMAX) 0.4 MG CAPS capsule TAKE 1-2 CAPSULES (0.4- 0.8 MG TOTAL) BY MOUTH DAILY.   timolol (TIMOPTIC-XR) 0.5 % ophthalmic gel-forming Place 1 drop into both eyes every morning.   vitamin C (ASCORBIC ACID) 500 MG tablet Take 500 mg by mouth daily.   zinc gluconate 50 MG tablet Take 50 mg by mouth daily.    [DISCONTINUED] rivaroxaban (XARELTO) 20 MG TABS tablet Take 1 tablet (20 mg total) by mouth daily with supper.     Allergies:   Chlordiazepoxide-clidinium, Citalopram hydrobromide, Erythromycin base, Hyoscyamine sulfate, Meloxicam, Penicillins, and Shrimp [shellfish allergy]   Social History   Socioeconomic History   Marital status: Divorced    Spouse name: Not on file   Number of children: 1   Years of education: Not on file   Highest education level: Not on file  Occupational History   Occupation: Art therapist, retired 1997    Employer: [employer]  Tobacco Use   Smoking status: Former    Types: Cigarettes, Cigars    Quit date: 08/22/1998    Years since quitting: 24.3   Smokeless tobacco: Never   Tobacco comments:    cigarettes 1968, cigars 2000  Vaping Use   Vaping Use: Never used  Substance and Sexual Activity   Alcohol use: Not Currently    Alcohol/week: 0.0 standard drinks of alcohol    Comment: rare   Drug use: No   Sexual activity: Never  Other Topics Concern   Not on file  Social History Narrative   Retired from [employer]   From [place]   Prev divorced    1 son   [place] [place] '63-'67, no known agent orange exposure   Social Determinants of Corporate investment banker Strain: Low Risk  (07/20/2022)   Overall Financial Resource Strain (CARDIA)    Difficulty of Paying Living Expenses: Not hard at all  Food Insecurity: No Food Insecurity (07/20/2022)   Hunger Vital Sign    Worried About Running Out of Food in the Last Year: Never true    Ran Out of Food in the Last Year: Never true  Transportation Needs: No Transportation Needs (07/20/2022)   PRAPARE - Administrator, Civil Service (Medical): No    Lack of Transportation (Non-Medical): No  Physical Activity: Insufficiently Active (07/20/2022)   Exercise Vital Sign    Days of Exercise per Week: 3 days    Minutes of Exercise per Session: 30 min  Stress: No Stress Concern Present (07/20/2022)    [employer] of Occupational Health - Occupational Stress Questionnaire    Feeling of Stress : Not at all  Social Connections: Socially Isolated (07/20/2022)   Social Connection and Isolation Panel [NHANES]    Frequency of Communication with Friends and Family: More than three times a week    Frequency of Social Gatherings with Friends and Family: Twice a week    Attends Religious Services: Never    Database administrator or Organizations: No    Attends Banker Meetings: Never  Marital Status: Divorced     Family History: The patient's family history includes Bladder Cancer in his father; Breast cancer in his maternal aunt; Cancer in his maternal aunt and maternal grandmother; Colon cancer in his maternal aunt; Esophageal cancer in his maternal aunt; Hypertension in his mother; Pneumonia in his father; Stomach cancer in his maternal aunt; Stroke in his mother. There is no history of Prostate cancer or Rectal cancer.  ROS:   Please see the history of present illness.     All other systems reviewed and are negative.  EKGs/Labs/Other Studies Reviewed:    The following studies were reviewed today:   EKG:  EKG is  ordered today.  The ekg ordered today demonstrates normal sinus rhythm, occasional PVCs.  Recent Labs: 10/07/2022: Pro B Natriuretic peptide (BNP) 45.0 10/09/2022: ALT 21 11/24/2022: BUN 13; Creatinine, Ser 0.93; Hemoglobin 15.5; Platelets 421.0; Potassium 4.8; Sodium 138  Recent Lipid Panel    Component Value Date/Time   CHOL 142 11/24/2022 0838   TRIG 111.0 11/24/2022 0838   HDL 38.40 (L) 11/24/2022 0838   CHOLHDL 4 11/24/2022 0838   VLDL 22.2 11/24/2022 0838   LDLCALC 82 11/24/2022 0838    Physical Exam:    VS:  BP (!) 138/90 (BP Location: Left Arm, Patient Position: Sitting, Cuff Size: Normal)   Pulse 66   Ht 5' 9.5" (1.765 m)   Wt 178 lb 6.4 oz (80.9 kg)   SpO2 97%   BMI 25.97 kg/m     Wt Readings from Last 3 Encounters:  12/30/22 178 lb  6.4 oz (80.9 kg)  12/01/22 175 lb (79.4 kg)  10/17/22 173 lb (78.5 kg)     GEN:  Well nourished, well developed in no acute distress HEENT: Normal NECK: No JVD; No carotid bruits CARDIAC: RRR, no murmurs, rubs, gallops RESPIRATORY:  Clear to auscultation without rales, wheezing or rhonchi  ABDOMEN: Soft, non-tender, non-distended MUSCULOSKELETAL:  No edema; No deformity  SKIN: Warm and dry NEUROLOGIC:  Alert and oriented x 3 PSYCHIATRIC:  Normal affect   ASSESSMENT:    1. Paroxysmal atrial fibrillation (HCC)   2. Portal vein thrombosis    PLAN:    In order of problems listed above:  paroxysmal atrial fibrillation.  Currently in sinus rhythm.  Tolerating Xarelto, previously did not tolerate Toprol-XL due to dizziness.  Denies palpitations, dizziness.  Continue Xarelto. History of portal vein thrombosis, on Xarelto, follow-up with PCP, hematology.  Follow-up in 12-18 months year   Medication Adjustments/Labs and Tests Ordered: Current medicines are reviewed at length with the patient today.  Concerns regarding medicines are outlined above.  Orders Placed This Encounter  Procedures   EKG 12-Lead   Meds ordered this encounter  Medications   rivaroxaban (XARELTO) 20 MG TABS tablet    Sig: Take 1 tablet (20 mg total) by mouth daily with supper.    Dispense:  90 tablet    Refill:  3    Patient Instructions  Medication Instructions:   Your physician recommends that you continue on your current medications as directed. Please refer to the Current Medication list given to you today.  *If you need a refill on your cardiac medications before your next appointment, please call your pharmacy*   Lab Work:  None Ordered  If you have labs (blood work) drawn today and your tests are completely normal, you will receive your results only by: MyChart Message (if you have MyChart) OR A paper copy in the mail If you have any  lab test that is abnormal or we need to change your  treatment, we will call you to review the results.   Testing/Procedures:  None Ordered   Follow-Up: At [facility], you and your health needs are our priority.  As part of our continuing mission to provide you with exceptional heart care, we have created designated Provider Care Teams.  These Care Teams include your primary Cardiologist (physician) and Advanced Practice Providers (APPs -  Physician Assistants and Nurse Practitioners) who all work together to provide you with the care you need, when you need it.  We recommend signing up for the patient portal called "MyChart".  Sign up information is provided on this After Visit Summary.  MyChart is used to connect with patients for Virtual Visits (Telemedicine).  Patients are able to view lab/test results, encounter notes, upcoming appointments, etc.  Non-urgent messages can be sent to your provider as well.   To learn more about what you can do with MyChart, go to [email or web address].    Your next appointment:   12 - 18 month(s)  Provider:   You may see Debbe Odea, MD or one of the following Advanced Practice Providers on your designated Care Team:   Nicolasa Ducking, NP Eula Listen, PA-C Cadence Fransico Michael, PA-C Charlsie Quest, NP    Signed, Debbe Odea, MD  12/30/2022 12:18 PM    [facility]

## 2022-12-30 NOTE — Patient Instructions (Signed)
Medication Instructions:   Your physician recommends that you continue on your current medications as directed. Please refer to the Current Medication list given to you today.  *If you need a refill on your cardiac medications before your next appointment, please call your pharmacy*   Lab Work:  None Ordered  If you have labs (blood work) drawn today and your tests are completely normal, you will receive your results only by: MyChart Message (if you have MyChart) OR A paper copy in the mail If you have any lab test that is abnormal or we need to change your treatment, we will call you to review the results.   Testing/Procedures:  None Ordered    Follow-Up: At [facility], you and your health needs are our priority.  As part of our continuing mission to provide you with exceptional heart care, we have created designated Provider Care Teams.  These Care Teams include your primary Cardiologist (physician) and Advanced Practice Providers (APPs -  Physician Assistants and Nurse Practitioners) who all work together to provide you with the care you need, when you need it.  We recommend signing up for the patient portal called "MyChart".  Sign up information is provided on this After Visit Summary.  MyChart is used to connect with patients for Virtual Visits (Telemedicine).  Patients are able to view lab/test results, encounter notes, upcoming appointments, etc.  Non-urgent messages can be sent to your provider as well.   To learn more about what you can do with MyChart, go to [email or web address].    Your next appointment:   12 - 18 month(s)  Provider:   You may see Brian Agbor-Etang, MD or one of the following Advanced Practice Providers on your designated Care Team:   Christopher Berge, NP Ryan Dunn, PA-C Cadence Furth, PA-C Sheri Hammock, NP  

## 2023-01-05 ENCOUNTER — Ambulatory Visit (INDEPENDENT_AMBULATORY_CARE_PROVIDER_SITE_OTHER): Payer: Medicare Other | Admitting: Dermatology

## 2023-01-05 VITALS — BP 143/83 | HR 62

## 2023-01-05 DIAGNOSIS — L814 Other melanin hyperpigmentation: Secondary | ICD-10-CM

## 2023-01-05 DIAGNOSIS — B079 Viral wart, unspecified: Secondary | ICD-10-CM

## 2023-01-05 DIAGNOSIS — L578 Other skin changes due to chronic exposure to nonionizing radiation: Secondary | ICD-10-CM

## 2023-01-05 DIAGNOSIS — L821 Other seborrheic keratosis: Secondary | ICD-10-CM

## 2023-01-05 DIAGNOSIS — W908XXA Exposure to other nonionizing radiation, initial encounter: Secondary | ICD-10-CM

## 2023-01-05 DIAGNOSIS — D1801 Hemangioma of skin and subcutaneous tissue: Secondary | ICD-10-CM

## 2023-01-05 DIAGNOSIS — Z1283 Encounter for screening for malignant neoplasm of skin: Secondary | ICD-10-CM | POA: Diagnosis not present

## 2023-01-05 DIAGNOSIS — D229 Melanocytic nevi, unspecified: Secondary | ICD-10-CM

## 2023-01-05 DIAGNOSIS — Z85828 Personal history of other malignant neoplasm of skin: Secondary | ICD-10-CM

## 2023-01-05 DIAGNOSIS — X32XXXA Exposure to sunlight, initial encounter: Secondary | ICD-10-CM

## 2023-01-05 DIAGNOSIS — L989 Disorder of the skin and subcutaneous tissue, unspecified: Secondary | ICD-10-CM

## 2023-01-05 NOTE — Progress Notes (Signed)
   Follow-Up Visit   Subjective  Mason [name] is a 77 y.o. male who presents for the following: Skin Cancer Screening and Full Body Skin Exam The patient presents for Total-Body Skin Exam (TBSE) for skin cancer screening and mole check. The patient has spots, moles and lesions to be evaluated, some may be new or changing and the patient has concerns that these could be cancer.  The following portions of the chart were reviewed this encounter and updated as appropriate: medications, allergies, medical history  Review of Systems:  No other skin or systemic complaints except as noted in HPI or Assessment and Plan.  Objective  Well appearing patient in no apparent distress; mood and affect are within normal limits.  A full examination was performed including scalp, head, eyes, ears, nose, lips, neck, chest, axillae, abdomen, back, buttocks, bilateral upper extremities, bilateral lower extremities, hands, feet, fingers, toes, fingernails, and toenails. All findings within normal limits unless otherwise noted below.   Relevant physical exam findings are noted in the Assessment and Plan.  L hand x 1 Verrucous papules -- Discussed viral etiology and contagion.    Assessment & Plan   LENTIGINES, SEBORRHEIC KERATOSES, HEMANGIOMAS - Benign normal skin lesions - Benign-appearing - Call for any changes  MELANOCYTIC NEVI - Tan-brown and/or pink-flesh-colored symmetric macules and papules - Benign appearing on exam today - Observation - Call clinic for new or changing moles - Recommend daily use of broad spectrum spf 30+ sunscreen to sun-exposed areas.   ACTINIC DAMAGE - Chronic condition, secondary to cumulative UV/sun exposure - diffuse scaly erythematous macules with underlying dyspigmentation - Recommend daily broad spectrum sunscreen SPF 30+ to sun-exposed areas, reapply every 2 hours as needed.  - Staying in the shade or wearing long sleeves, sun glasses (UVA+UVB protection) and  wide brim hats (4-inch brim around the entire circumference of the hat) are also recommended for sun protection.  - Call for new or changing lesions.  HISTORY OF BASAL CELL CARCINOMA OF THE SKIN - No evidence of recurrence today - Recommend regular full body skin exams - Recommend daily broad spectrum sunscreen SPF 30+ to sun-exposed areas, reapply every 2 hours as needed.  - Call if any new or changing lesions are noted between office visits  Scar vs Texture change of skin -mild indentation of the nasal tip - 0.6 cm  nasal tip See Photo Indentation appears better today than last visit compared to photos  No sign of skin cancer  Consider biopsy if any changes, stable today.  Viral warts, unspecified type L hand x 1  Destruction of lesion - L hand x 1 Complexity: simple   Destruction method: cryotherapy   Informed consent: discussed and consent obtained   Timeout:  patient name, date of birth, surgical site, and procedure verified Lesion destroyed using liquid nitrogen: Yes   Region frozen until ice ball extended beyond lesion: Yes   Outcome: patient tolerated procedure well with no complications   Post-procedure details: wound care instructions given     SKIN CANCER SCREENING PERFORMED TODAY.  Return in about 1 year (around 01/05/2024) for TBSE.  Maylene Roes, CMA, am acting as scribe for Armida Sans, MD .  Documentation: I have reviewed the above documentation for accuracy and completeness, and I agree with the above.  Armida Sans, MD

## 2023-01-05 NOTE — Patient Instructions (Signed)
Due to recent changes in healthcare laws, you may see results of your pathology and/or laboratory studies on MyChart before the doctors have had a chance to review them. We understand that in some cases there may be results that are confusing or concerning to you. Please understand that not all results are received at the same time and often the doctors may need to interpret multiple results in order to provide you with the best plan of care or course of treatment. Therefore, we ask that you please give us 2 business days to thoroughly review all your results before contacting the office for clarification. Should we see a critical lab result, you will be contacted sooner.   If You Need Anything After Your Visit  If you have any questions or concerns for your doctor, please call our main line at [phone] and press option 4 to reach your doctor's medical assistant. If no one answers, please leave a voicemail as directed and we will return your call as soon as possible. Messages left after 4 pm will be answered the following business day.   You may also send us a message via MyChart. We typically respond to MyChart messages within 1-2 business days.  For prescription refills, please ask your pharmacy to contact our office. Our fax number is [phone].  If you have an urgent issue when the clinic is closed that cannot wait until the next business day, you can page your doctor at the number below.    Please note that while we do our best to be available for urgent issues outside of office hours, we are not available 24/7.   If you have an urgent issue and are unable to reach us, you may choose to seek medical care at your doctor's office, retail clinic, urgent care center, or emergency room.  If you have a medical emergency, please immediately call 911 or go to the emergency department.  Pager Numbers  - Dr. Kowalski: [phone]  - Dr. Moye: [phone]  - Dr. Stewart:  [phone]  In the event of inclement weather, please call our main line at [phone] for an update on the status of any delays or closures.  Dermatology Medication Tips: Please keep the boxes that topical medications come in in order to help keep track of the instructions about where and how to use these. Pharmacies typically print the medication instructions only on the boxes and not directly on the medication tubes.   If your medication is too expensive, please contact our office at [phone] option 4 or send us a message through MyChart.   We are unable to tell what your co-pay for medications will be in advance as this is different depending on your insurance coverage. However, we may be able to find a substitute medication at lower cost or fill out paperwork to get insurance to cover a needed medication.   If a prior authorization is required to get your medication covered by your insurance company, please allow us 1-2 business days to complete this process.  Drug prices often vary depending on where the prescription is filled and some pharmacies may offer cheaper prices.  The website www.goodrx.com contains coupons for medications through different pharmacies. The prices here do not account for what the cost may be with help from insurance (it may be cheaper with your insurance), but the website can give you the price if you did not use any insurance.  - You can print the associated coupon and take it with   your prescription to the pharmacy.  - You may also stop by our office during regular business hours and pick up a GoodRx coupon card.  - If you need your prescription sent electronically to a different pharmacy, notify our office through [facility] MyChart or by phone at [phone] option 4.     Si Usted Necesita Algo Despus de Su Visita  Tambin puede enviarnos un mensaje a travs de MyChart. Por lo general respondemos a los mensajes de MyChart en el transcurso de 1 a 2  das hbiles.  Para renovar recetas, por favor pida a su farmacia que se ponga en contacto con nuestra oficina. Nuestro nmero de fax es el [phone].  Si tiene un asunto urgente cuando la clnica est cerrada y que no puede esperar hasta el siguiente da hbil, puede llamar/localizar a su doctor(a) al nmero que aparece a continuacin.   Por favor, tenga en cuenta que aunque hacemos todo lo posible para estar disponibles para asuntos urgentes fuera del horario de oficina, no estamos disponibles las 24 horas del da, los 7 das de la semana.   Si tiene un problema urgente y no puede comunicarse con nosotros, puede optar por buscar atencin mdica  en el consultorio de su doctor(a), en una clnica privada, en un centro de atencin urgente o en una sala de emergencias.  Si tiene una emergencia mdica, por favor llame inmediatamente al 911 o vaya a la sala de emergencias.  Nmeros de bper  - Dr. Kowalski: [phone]  - Dra. Moye: [phone]  - Dra. Stewart: [phone]  En caso de inclemencias del tiempo, por favor llame a nuestra lnea principal al [phone] para una actualizacin sobre el estado de cualquier retraso o cierre.  Consejos para la medicacin en dermatologa: Por favor, guarde las cajas en las que vienen los medicamentos de uso tpico para ayudarle a seguir las instrucciones sobre dnde y cmo usarlos. Las farmacias generalmente imprimen las instrucciones del medicamento slo en las cajas y no directamente en los tubos del medicamento.   Si su medicamento es muy caro, por favor, pngase en contacto con nuestra oficina llamando al [phone] y presione la opcin 4 o envenos un mensaje a travs de MyChart.   No podemos decirle cul ser su copago por los medicamentos por adelantado ya que esto es diferente dependiendo de la cobertura de su seguro. Sin embargo, es posible que podamos encontrar un medicamento sustituto a menor costo o llenar un formulario para que el  seguro cubra el medicamento que se considera necesario.   Si se requiere una autorizacin previa para que su compaa de seguros cubra su medicamento, por favor permtanos de 1 a 2 das hbiles para completar este proceso.  Los precios de los medicamentos varan con frecuencia dependiendo del lugar de dnde se surte la receta y alguna farmacias pueden ofrecer precios ms baratos.  El sitio web www.goodrx.com tiene cupones para medicamentos de diferentes farmacias. Los precios aqu no tienen en cuenta lo que podra costar con la ayuda del seguro (puede ser ms barato con su seguro), pero el sitio web puede darle el precio si no utiliz ningn seguro.  - Puede imprimir el cupn correspondiente y llevarlo con su receta a la farmacia.  - Tambin puede pasar por nuestra oficina durante el horario de atencin regular y recoger una tarjeta de cupones de GoodRx.  - Si necesita que su receta se enve electrnicamente a una farmacia diferente, informe a nuestra oficina a travs de MyChart de [facility]   o por telfono llamando al [phone] y presione la opcin 4.  

## 2023-01-18 ENCOUNTER — Encounter: Payer: Self-pay | Admitting: Dermatology

## 2023-01-24 ENCOUNTER — Other Ambulatory Visit: Payer: Self-pay | Admitting: Family Medicine

## 2023-02-07 ENCOUNTER — Other Ambulatory Visit: Payer: Self-pay | Admitting: Family Medicine

## 2023-03-02 ENCOUNTER — Other Ambulatory Visit (INDEPENDENT_AMBULATORY_CARE_PROVIDER_SITE_OTHER): Payer: Medicare Other

## 2023-03-02 DIAGNOSIS — Z125 Encounter for screening for malignant neoplasm of prostate: Secondary | ICD-10-CM | POA: Diagnosis not present

## 2023-03-02 LAB — PSA, MEDICARE: PSA: 4.67 ng/ml — ABNORMAL HIGH (ref 0.10–4.00)

## 2023-03-03 ENCOUNTER — Telehealth: Payer: Self-pay | Admitting: Family Medicine

## 2023-03-03 NOTE — Telephone Encounter (Signed)
This can happen with a blood draw.  If no fever or swelling I wouldn't do anything differently.  Thanks.

## 2023-03-03 NOTE — Telephone Encounter (Signed)
Patient had his blood drawn yesterday and had a bruise with a pea size lump. He called and we decided for him to come in so I could look at his arm. The bruise was about the size of a nickel, no lump now. I explained that is was a small hematoma and it sometimes happens when blood is taken. He is also on a blood thinner. He was reassured that he was fine. He was very concerned. I also told him I would document and let his provided know

## 2023-03-08 ENCOUNTER — Telehealth: Payer: Self-pay | Admitting: Family Medicine

## 2023-03-08 ENCOUNTER — Encounter: Payer: Self-pay | Admitting: Family Medicine

## 2023-03-08 DIAGNOSIS — R972 Elevated prostate specific antigen [PSA]: Secondary | ICD-10-CM

## 2023-03-08 NOTE — Telephone Encounter (Signed)
Patient called back regarding getting referral sent out to urologist.He said that they are needing a referral  since it has been 8 years since he last seen them,and he would like to have it sent to [facility]  in [place].

## 2023-03-08 NOTE — Addendum Note (Signed)
Addended by: Joaquim Nam on: 03/08/2023 04:18 PM   Modules accepted: Orders

## 2023-03-08 NOTE — Telephone Encounter (Signed)
 Referral ordered.  Thanks

## 2023-04-14 ENCOUNTER — Ambulatory Visit: Payer: Medicare Other | Admitting: Family Medicine

## 2023-04-14 ENCOUNTER — Ambulatory Visit (INDEPENDENT_AMBULATORY_CARE_PROVIDER_SITE_OTHER)
Admission: RE | Admit: 2023-04-14 | Discharge: 2023-04-14 | Disposition: A | Discharge status: Home / Self Care | Payer: Medicare Other | Source: Ambulatory Visit | Attending: Family Medicine | Admitting: Family Medicine

## 2023-04-14 ENCOUNTER — Other Ambulatory Visit: Payer: Self-pay | Admitting: Family Medicine

## 2023-04-14 ENCOUNTER — Encounter: Payer: Self-pay | Admitting: Family Medicine

## 2023-04-14 ENCOUNTER — Encounter: Payer: Self-pay | Admitting: *Deleted

## 2023-04-14 VITALS — BP 136/80 | HR 69 | Temp 98.1°F | Ht 69.5 in | Wt 180.0 lb

## 2023-04-14 DIAGNOSIS — M549 Dorsalgia, unspecified: Secondary | ICD-10-CM

## 2023-04-14 DIAGNOSIS — M542 Cervicalgia: Secondary | ICD-10-CM | POA: Diagnosis not present

## 2023-04-14 MED ORDER — PREDNISONE 10 MG PO TABS: ORAL_TABLET | ORAL | 0 refills | Status: DC

## 2023-04-14 NOTE — Progress Notes (Unsigned)
Neck and pain.  L sided neck pain radiated up the back of the L scalp.  L sided, unilateral, doesn't cross the midline.  Now with some R sided neck pain at baseline.  Sleep disrupted.    He had urology f/u in the meantime.    Back Pain  Patient is hurting in his lower back and worse after working on a car and maybe bending wrong- this was last fall.  Some R lower back pain, can radiate to the R leg. He has crepitus in the back with back flexion and extension.    He is having more pain from his neck/occiput pain than from his back.    Meds, vitals, and allergies reviewed.   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 No acute abnormality is noted.

## 2023-04-14 NOTE — Telephone Encounter (Signed)
Sent. Thanks.  M54.2

## 2023-04-14 NOTE — Telephone Encounter (Signed)
Dx code needs to be included with rx per pharmacy

## 2023-04-14 NOTE — Patient Instructions (Addendum)
Go to the lab on the way out.   If you have mychart we'll likely use that to update you.    Prednisone with food.  Take care.  Glad to see you. You should get a call about the spine clinic.

## 2023-04-16 DIAGNOSIS — M549 Dorsalgia, unspecified: Secondary | ICD-10-CM | POA: Insufficient documentation

## 2023-04-16 NOTE — Assessment & Plan Note (Signed)
Discussed checking plain films.  Prednisone cautions discussed with patient.  Refer to neurosurgery.  At this point still okay for outpatient follow-up.

## 2023-04-25 ENCOUNTER — Emergency Department (HOSPITAL_COMMUNITY): Payer: Medicare Other

## 2023-04-25 ENCOUNTER — Inpatient Hospital Stay (HOSPITAL_COMMUNITY)
Admission: EM | Admit: 2023-04-25 | Discharge: 2023-05-05 | DRG: 064 | Disposition: A | Discharge status: Skilled Nursing Facility | Payer: Medicare Other | Attending: Family Medicine | Admitting: Family Medicine

## 2023-04-25 DIAGNOSIS — Y92009 Unspecified place in unspecified non-institutional (private) residence as the place of occurrence of the external cause: Secondary | ICD-10-CM | POA: Diagnosis not present

## 2023-04-25 DIAGNOSIS — Z9841 Cataract extraction status, right eye: Secondary | ICD-10-CM

## 2023-04-25 DIAGNOSIS — I7122 Aneurysm of the aortic arch, without rupture: Secondary | ICD-10-CM | POA: Diagnosis present

## 2023-04-25 DIAGNOSIS — I2699 Other pulmonary embolism without acute cor pulmonale: Secondary | ICD-10-CM | POA: Diagnosis present

## 2023-04-25 DIAGNOSIS — Z9842 Cataract extraction status, left eye: Secondary | ICD-10-CM | POA: Diagnosis not present

## 2023-04-25 DIAGNOSIS — J189 Pneumonia, unspecified organism: Secondary | ICD-10-CM | POA: Diagnosis not present

## 2023-04-25 DIAGNOSIS — E785 Hyperlipidemia, unspecified: Secondary | ICD-10-CM | POA: Diagnosis present

## 2023-04-25 DIAGNOSIS — Z85828 Personal history of other malignant neoplasm of skin: Secondary | ICD-10-CM | POA: Diagnosis not present

## 2023-04-25 DIAGNOSIS — N320 Bladder-neck obstruction: Secondary | ICD-10-CM | POA: Diagnosis not present

## 2023-04-25 DIAGNOSIS — K5900 Constipation, unspecified: Secondary | ICD-10-CM | POA: Diagnosis not present

## 2023-04-25 DIAGNOSIS — I615 Nontraumatic intracerebral hemorrhage, intraventricular: Secondary | ICD-10-CM | POA: Diagnosis present

## 2023-04-25 DIAGNOSIS — Z86718 Personal history of other venous thrombosis and embolism: Secondary | ICD-10-CM

## 2023-04-25 DIAGNOSIS — I639 Cerebral infarction, unspecified: Secondary | ICD-10-CM | POA: Diagnosis present

## 2023-04-25 DIAGNOSIS — I63429 Cerebral infarction due to embolism of unspecified anterior cerebral artery: Secondary | ICD-10-CM | POA: Diagnosis not present

## 2023-04-25 DIAGNOSIS — I161 Hypertensive emergency: Secondary | ICD-10-CM | POA: Diagnosis present

## 2023-04-25 DIAGNOSIS — R471 Dysarthria and anarthria: Secondary | ICD-10-CM | POA: Diagnosis present

## 2023-04-25 DIAGNOSIS — I10 Essential (primary) hypertension: Secondary | ICD-10-CM | POA: Diagnosis present

## 2023-04-25 DIAGNOSIS — Z7952 Long term (current) use of systemic steroids: Secondary | ICD-10-CM

## 2023-04-25 DIAGNOSIS — Z803 Family history of malignant neoplasm of breast: Secondary | ICD-10-CM

## 2023-04-25 DIAGNOSIS — Z91013 Allergy to seafood: Secondary | ICD-10-CM

## 2023-04-25 DIAGNOSIS — R29715 NIHSS score 15: Secondary | ICD-10-CM | POA: Diagnosis present

## 2023-04-25 DIAGNOSIS — I614 Nontraumatic intracerebral hemorrhage in cerebellum: Secondary | ICD-10-CM | POA: Diagnosis present

## 2023-04-25 DIAGNOSIS — G8191 Hemiplegia, unspecified affecting right dominant side: Secondary | ICD-10-CM | POA: Diagnosis present

## 2023-04-25 DIAGNOSIS — E871 Hypo-osmolality and hyponatremia: Secondary | ICD-10-CM | POA: Diagnosis not present

## 2023-04-25 DIAGNOSIS — K5730 Diverticulosis of large intestine without perforation or abscess without bleeding: Secondary | ICD-10-CM | POA: Diagnosis present

## 2023-04-25 DIAGNOSIS — Z23 Encounter for immunization: Secondary | ICD-10-CM

## 2023-04-25 DIAGNOSIS — Z87891 Personal history of nicotine dependence: Secondary | ICD-10-CM

## 2023-04-25 DIAGNOSIS — I4891 Unspecified atrial fibrillation: Secondary | ICD-10-CM | POA: Diagnosis present

## 2023-04-25 DIAGNOSIS — Z888 Allergy status to other drugs, medicaments and biological substances status: Secondary | ICD-10-CM

## 2023-04-25 DIAGNOSIS — R29810 Facial weakness: Secondary | ICD-10-CM | POA: Diagnosis present

## 2023-04-25 DIAGNOSIS — Z880 Allergy status to penicillin: Secondary | ICD-10-CM

## 2023-04-25 DIAGNOSIS — Z86711 Personal history of pulmonary embolism: Secondary | ICD-10-CM | POA: Diagnosis not present

## 2023-04-25 DIAGNOSIS — I6300 Cerebral infarction due to thrombosis of unspecified precerebral artery: Secondary | ICD-10-CM | POA: Diagnosis not present

## 2023-04-25 DIAGNOSIS — F1020 Alcohol dependence, uncomplicated: Secondary | ICD-10-CM | POA: Diagnosis present

## 2023-04-25 DIAGNOSIS — I610 Nontraumatic intracerebral hemorrhage in hemisphere, subcortical: Principal | ICD-10-CM | POA: Diagnosis present

## 2023-04-25 DIAGNOSIS — F419 Anxiety disorder, unspecified: Secondary | ICD-10-CM | POA: Diagnosis present

## 2023-04-25 DIAGNOSIS — I513 Intracardiac thrombosis, not elsewhere classified: Secondary | ICD-10-CM | POA: Diagnosis present

## 2023-04-25 DIAGNOSIS — Z79899 Other long term (current) drug therapy: Secondary | ICD-10-CM

## 2023-04-25 DIAGNOSIS — Z8249 Family history of ischemic heart disease and other diseases of the circulatory system: Secondary | ICD-10-CM

## 2023-04-25 DIAGNOSIS — F32A Depression, unspecified: Secondary | ICD-10-CM | POA: Diagnosis present

## 2023-04-25 DIAGNOSIS — I160 Hypertensive urgency: Secondary | ICD-10-CM | POA: Diagnosis present

## 2023-04-25 DIAGNOSIS — R7302 Impaired glucose tolerance (oral): Secondary | ICD-10-CM | POA: Diagnosis present

## 2023-04-25 DIAGNOSIS — I700 Atherosclerosis of aorta: Secondary | ICD-10-CM | POA: Diagnosis present

## 2023-04-25 DIAGNOSIS — D6832 Hemorrhagic disorder due to extrinsic circulating anticoagulants: Secondary | ICD-10-CM | POA: Diagnosis present

## 2023-04-25 DIAGNOSIS — Z800 Family history of malignant neoplasm of digestive organs: Secondary | ICD-10-CM

## 2023-04-25 DIAGNOSIS — I619 Nontraumatic intracerebral hemorrhage, unspecified: Secondary | ICD-10-CM | POA: Diagnosis not present

## 2023-04-25 DIAGNOSIS — Z823 Family history of stroke: Secondary | ICD-10-CM

## 2023-04-25 DIAGNOSIS — D72829 Elevated white blood cell count, unspecified: Secondary | ICD-10-CM | POA: Diagnosis present

## 2023-04-25 DIAGNOSIS — K219 Gastro-esophageal reflux disease without esophagitis: Secondary | ICD-10-CM | POA: Diagnosis present

## 2023-04-25 DIAGNOSIS — T45515A Adverse effect of anticoagulants, initial encounter: Secondary | ICD-10-CM | POA: Diagnosis present

## 2023-04-25 DIAGNOSIS — I6389 Other cerebral infarction: Secondary | ICD-10-CM | POA: Diagnosis not present

## 2023-04-25 DIAGNOSIS — Z8052 Family history of malignant neoplasm of bladder: Secondary | ICD-10-CM

## 2023-04-25 DIAGNOSIS — R1312 Dysphagia, oropharyngeal phase: Secondary | ICD-10-CM | POA: Diagnosis present

## 2023-04-25 DIAGNOSIS — R1310 Dysphagia, unspecified: Secondary | ICD-10-CM | POA: Diagnosis present

## 2023-04-25 LAB — COMPREHENSIVE METABOLIC PANEL
ALT: 18 U/L (ref 0–44)
AST: 76 U/L — ABNORMAL HIGH (ref 15–41)
Albumin: 3.4 g/dL — ABNORMAL LOW (ref 3.5–5.0)
Alkaline Phosphatase: 64 U/L (ref 38–126)
Anion gap: 10 (ref 5–15)
BUN: 17 mg/dL (ref 8–23)
CO2: 25 mmol/L (ref 22–32)
Calcium: 8.7 mg/dL — ABNORMAL LOW (ref 8.9–10.3)
Chloride: 98 mmol/L (ref 98–111)
Creatinine, Ser: 1.05 mg/dL (ref 0.61–1.24)
GFR, Estimated: >60 mL/min (ref >60)
Glucose, Bld: 105 mg/dL — ABNORMAL HIGH (ref 70–99)
Potassium: 5.7 mmol/L — ABNORMAL HIGH (ref 3.5–5.1)
Sodium: 133 mmol/L — ABNORMAL LOW (ref 135–145)
Total Bilirubin: 1.2 mg/dL (ref 0.3–1.2)
Total Protein: 6.4 g/dL — ABNORMAL LOW (ref 6.5–8.1)

## 2023-04-25 LAB — PROTIME-INR
INR: 1.1 (ref 0.8–1.2)
Prothrombin Time: 14.8 s (ref 11.4–15.2)

## 2023-04-25 LAB — CBC
HCT: 49.4 % (ref 39.0–52.0)
Hemoglobin: 16.2 g/dL (ref 13.0–17.0)
MCH: 28.2 pg (ref 26.0–34.0)
MCHC: 32.8 g/dL (ref 30.0–36.0)
MCV: 86.1 fL (ref 80.0–100.0)
Platelets: 515 10*3/uL — ABNORMAL HIGH (ref 150–400)
RBC: 5.74 MIL/uL (ref 4.22–5.81)
RDW: 15.3 % (ref 11.5–15.5)
WBC: 14.8 10*3/uL — ABNORMAL HIGH (ref 4.0–10.5)
nRBC: 0 % (ref 0.0–0.2)

## 2023-04-25 LAB — I-STAT CHEM 8, ED
BUN: 25 mg/dL — ABNORMAL HIGH (ref 8–23)
Calcium, Ion: 1.04 mmol/L — ABNORMAL LOW (ref 1.15–1.40)
Chloride: 99 mmol/L (ref 98–111)
Creatinine, Ser: 1.1 mg/dL (ref 0.61–1.24)
Glucose, Bld: 103 mg/dL — ABNORMAL HIGH (ref 70–99)
HCT: 51 % (ref 39.0–52.0)
Hemoglobin: 17.3 g/dL — ABNORMAL HIGH (ref 13.0–17.0)
Potassium: 5.4 mmol/L — ABNORMAL HIGH (ref 3.5–5.1)
Sodium: 135 mmol/L (ref 135–145)
TCO2: 28 mmol/L (ref 22–32)

## 2023-04-25 LAB — DIFFERENTIAL
Abs Immature Granulocytes: 0.12 10*3/uL — ABNORMAL HIGH (ref 0.00–0.07)
Basophils Absolute: 0.2 10*3/uL — ABNORMAL HIGH (ref 0.0–0.1)
Basophils Relative: 1 %
Eosinophils Absolute: 0.5 10*3/uL (ref 0.0–0.5)
Eosinophils Relative: 4 %
Immature Granulocytes: 1 %
Lymphocytes Relative: 23 %
Lymphs Abs: 3.4 10*3/uL (ref 0.7–4.0)
Monocytes Absolute: 1 10*3/uL (ref 0.1–1.0)
Monocytes Relative: 7 %
Neutro Abs: 9.6 10*3/uL — ABNORMAL HIGH (ref 1.7–7.7)
Neutrophils Relative %: 64 %

## 2023-04-25 LAB — ETHANOL: Alcohol, Ethyl (B): <10 mg/dL (ref <10)

## 2023-04-25 LAB — APTT: aPTT: 36 s (ref 24–36)

## 2023-04-25 MED ORDER — CLEVIDIPINE BUTYRATE 0.5 MG/ML IV EMUL
0.0000 mg/h | INTRAVENOUS | Status: DC
  Administered 2023-04-25: 2 mg/h via INTRAVENOUS
  Administered 2023-04-26: 17 mg/h via INTRAVENOUS
  Administered 2023-04-26: 16 mg/h via INTRAVENOUS
  Administered 2023-04-26: 17 mg/h via INTRAVENOUS
  Administered 2023-04-26: 14 mg/h via INTRAVENOUS
  Administered 2023-04-26: 2 mg/h via INTRAVENOUS
  Administered 2023-04-26: 13 mg/h via INTRAVENOUS
  Administered 2023-04-27: 8 mg/h via INTRAVENOUS
  Administered 2023-04-27: 16 mg/h via INTRAVENOUS
  Administered 2023-04-27: 10 mg/h via INTRAVENOUS
  Administered 2023-04-27: 15 mg/h via INTRAVENOUS
  Administered 2023-04-28: 4 mg/h via INTRAVENOUS
  Filled 2023-04-25: qty 50
  Filled 2023-04-25: qty 100
  Filled 2023-04-25: qty 50
  Filled 2023-04-25 (×3): qty 100
  Filled 2023-04-25: qty 50
  Filled 2023-04-25 (×3): qty 100

## 2023-04-25 MED ORDER — PANTOPRAZOLE SODIUM 40 MG IV SOLR
40.0000 mg | Freq: Every day | INTRAVENOUS | Status: DC
  Administered 2023-04-26: 40 mg via INTRAVENOUS
  Filled 2023-04-25: qty 10

## 2023-04-25 MED ORDER — EMPTY CONTAINERS FLEXIBLE MISC
1800.0000 mg | Freq: Once | Status: AC
  Administered 2023-04-25: 1800 mg via INTRAVENOUS
  Filled 2023-04-25: qty 180

## 2023-04-25 MED ORDER — CHLORHEXIDINE GLUCONATE CLOTH 2 % EX PADS
6.0000 | MEDICATED_PAD | Freq: Every day | CUTANEOUS | Status: DC
  Administered 2023-04-26 – 2023-05-05 (×8): 6 via TOPICAL

## 2023-04-25 MED ORDER — ACETAMINOPHEN 650 MG RE SUPP: 650.0000 mg | RECTAL | Status: DC | PRN

## 2023-04-25 MED ORDER — ACETAMINOPHEN 325 MG PO TABS
650.0000 mg | ORAL_TABLET | ORAL | Status: DC | PRN
  Administered 2023-04-26 – 2023-05-04 (×9): 650 mg via ORAL
  Filled 2023-04-25 (×11): qty 2

## 2023-04-25 MED ORDER — ACETAMINOPHEN 160 MG/5ML PO SOLN: 650.0000 mg | ORAL | Status: DC | PRN

## 2023-04-25 MED ORDER — STROKE: EARLY STAGES OF RECOVERY BOOK
Freq: Once | Status: AC
  Filled 2023-04-25: qty 1

## 2023-04-25 MED ORDER — SENNOSIDES-DOCUSATE SODIUM 8.6-50 MG PO TABS: 1.0000 | ORAL_TABLET | Freq: Two times a day (BID) | ORAL | Status: DC

## 2023-04-25 NOTE — ED Triage Notes (Signed)
Patient BIB [facility] c/o right side weakness, LKN 2000.  Patient right arm and right leg weak.  18 L AC 16 R FA

## 2023-04-25 NOTE — ED Notes (Signed)
Pt son Ugalde) called and updated as per pt requested.

## 2023-04-25 NOTE — H&P (Addendum)
Neurology Consultation Reason for Consult: Right-sided weakness Referring Physician: Silverio Lay, D  CC: Right-sided weakness  History is obtained from: Patient  HPI: Mason [name] is a 77 y.o. male with a history of atrial fibrillation diagnosed about 4 years ago per patient who has been managed on Xarelto since that time.  He was in his normal state of health until 8 PM at which point he started noticing right-sided weakness.  He states that he is not sure if he took his dose of Xarelto tonight or not.   LKW: 8 PM tnk given?: no, ICH Premorbid modified rankin scale: Zero ICH Score: 0   Past Medical History:  Diagnosis Date   Actinic keratosis 04/02/2013   L sup scapula - bx proven   Actinic keratosis 04/02/2013   R med ant deltoid - bx proven   Allergy    Anal fissure    Anxiety    Basal cell carcinoma    back - treated in the past    Basal cell carcinoma 05/02/2009   left supratip, Mohs   Cancer (HCC)    basal cell removed x4   Cataract    bil eyes   Depression    Diverticulosis of colon    GERD (gastroesophageal reflux disease)    esophageal narrowing   Glaucoma    Hiatal hernia    IBS (irritable bowel syndrome)    Insomnia    Internal hemorrhoids      Family History  Problem Relation Age of Onset   Hypertension Mother    Stroke Mother    Pneumonia Father    Bladder Cancer Father    Cancer Maternal Aunt        ? type   Breast cancer Maternal Aunt        mets   Esophageal cancer Maternal Aunt        aunt ? esophageal ca   Stomach cancer Maternal Aunt        aunt ? stmach ca   Colon cancer Maternal Aunt        had colostomy - not sure of origin   Cancer Maternal Grandmother        ? type   Prostate cancer Neg Hx    Rectal cancer Neg Hx      Social History:  reports that he quit smoking about 24 years ago. His smoking use included cigarettes and cigars. He has never used smokeless tobacco. He reports that he does not currently use alcohol. He reports  that he does not use drugs.   Exam: Current vital signs: BP (!) 173/94 Comment: arrival to 4n- increased clevi  Pulse 77   Temp 97.8 F (36.6 C) (Oral)   Resp 12   Wt 81.8 kg   SpO2 96%   BMI 26.25 kg/m  Vital signs in last 24 hours: Temp:  [97.8 F (36.6 C)] 97.8 F (36.6 C) (09/03 2243) Pulse Rate:  [55-83] 77 (09/03 2240) Resp:  [10-19] 12 (09/03 2300) BP: (157-178)/(93-110) 173/94 (09/03 2300) SpO2:  [96 %-100 %] 96 % (09/03 2240) Weight:  [81.8 kg] 81.8 kg (09/03 2106)   Physical Exam  Appears well-developed and well-nourished.   Physical Exam  Constitutional: Appears well-developed and well-nourished.  Psych: Affect appropriate to situation Eyes: No scleral injection HENT: No OP obstrucion MSK: no joint deformities.  Cardiovascular: Normal rate and regular rhythm.  Respiratory: Effort normal, non-labored breathing GI: Soft.  No distension. There is no tenderness.  Skin: WDI  Neuro: Mental  Status: Patient is awake, alert, he is unable to give the month, but does give his age correctly.  He has increased latency of response, but is able to name objects. Cranial Nerves: II: Visual Fields are full. Pupils are equal, round, and reactive to light.   III,IV, VI: EOMI without ptosis or diploplia.  V: Facial sensation is symmetric to temperature VII: Facial movement is symmetric.  VIII: hearing is intact to voice X: Uvula elevates symmetrically XI: Shoulder shrug is symmetric. XII: tongue is midline without atrophy or fasciculations.  Motor:  he has severe weakness of both the right upper and lower extremity, with essentially no movement in his right upper extremity, and minimal movement without ability to resist gravity in the right lower extremity.   Sensory: He has severe sensory loss on the right Cerebellar: FNF and HKS are intact on the left, consistent with weakness on the right      I have reviewed labs in epic and the results pertinent to this  consultation are: Results for orders placed or performed during the hospital encounter of 04/25/23 (from the past 24 hour(s))  Ethanol     Status: None   Collection Time: 04/25/23  9:06 PM  Result Value Ref Range   Alcohol, Ethyl (B) <10 <10 mg/dL  Protime-INR     Status: None   Collection Time: 04/25/23  9:06 PM  Result Value Ref Range   Prothrombin Time 14.8 11.4 - 15.2 seconds   INR 1.1 0.8 - 1.2  APTT     Status: None   Collection Time: 04/25/23  9:06 PM  Result Value Ref Range   aPTT 36 24 - 36 seconds  CBC     Status: Abnormal   Collection Time: 04/25/23  9:06 PM  Result Value Ref Range   WBC 14.8 (H) 4.0 - 10.5 K/uL   RBC 5.74 4.22 - 5.81 MIL/uL   Hemoglobin 16.2 13.0 - 17.0 g/dL   HCT 16.1 09.6 - 04.5 %   MCV 86.1 80.0 - 100.0 fL   MCH 28.2 26.0 - 34.0 pg   MCHC 32.8 30.0 - 36.0 g/dL   RDW 40.9 81.1 - 91.4 %   Platelets 515 (H) 150 - 400 K/uL   nRBC 0.0 0.0 - 0.2 %  Differential     Status: Abnormal   Collection Time: 04/25/23  9:06 PM  Result Value Ref Range   Neutrophils Relative % 64 %   Neutro Abs 9.6 (H) 1.7 - 7.7 K/uL   Lymphocytes Relative 23 %   Lymphs Abs 3.4 0.7 - 4.0 K/uL   Monocytes Relative 7 %   Monocytes Absolute 1.0 0.1 - 1.0 K/uL   Eosinophils Relative 4 %   Eosinophils Absolute 0.5 0.0 - 0.5 K/uL   Basophils Relative 1 %   Basophils Absolute 0.2 (H) 0.0 - 0.1 K/uL   Immature Granulocytes 1 %   Abs Immature Granulocytes 0.12 (H) 0.00 - 0.07 K/uL  Comprehensive metabolic panel     Status: Abnormal   Collection Time: 04/25/23  9:06 PM  Result Value Ref Range   Sodium 133 (L) 135 - 145 mmol/L   Potassium 5.7 (H) 3.5 - 5.1 mmol/L   Chloride 98 98 - 111 mmol/L   CO2 25 22 - 32 mmol/L   Glucose, Bld 105 (H) 70 - 99 mg/dL   BUN 17 8 - 23 mg/dL   Creatinine, Ser 7.82 0.61 - 1.24 mg/dL   Calcium 8.7 (L) 8.9 - 10.3 mg/dL  Total Protein 6.4 (L) 6.5 - 8.1 g/dL   Albumin 3.4 (L) 3.5 - 5.0 g/dL   AST 76 (H) 15 - 41 U/L   ALT 18 0 - 44 U/L    Alkaline Phosphatase 64 38 - 126 U/L   Total Bilirubin 1.2 0.3 - 1.2 mg/dL   GFR, Estimated >41 >32 mL/min   Anion gap 10 5 - 15  I-stat chem 8, ED     Status: Abnormal   Collection Time: 04/25/23  9:07 PM  Result Value Ref Range   Sodium 135 135 - 145 mmol/L   Potassium 5.4 (H) 3.5 - 5.1 mmol/L   Chloride 99 98 - 111 mmol/L   BUN 25 (H) 8 - 23 mg/dL   Creatinine, Ser 4.40 0.61 - 1.24 mg/dL   Glucose, Bld 102 (H) 70 - 99 mg/dL   Calcium, Ion 7.25 (L) 1.15 - 1.40 mmol/L   TCO2 28 22 - 32 mmol/L   Hemoglobin 17.3 (H) 13.0 - 17.0 g/dL   HCT 36.6 44.0 - 34.7 %  MRSA Next Gen by PCR, Nasal     Status: None   Collection Time: 04/25/23 11:00 PM   Specimen: Nasal Mucosa; Nasal Swab  Result Value Ref Range   MRSA by PCR Next Gen NOT DETECTED NOT DETECTED     I have reviewed the images obtained: CT head-unremarkable  Impression: 77 year old male who presents with left-sided subcortical hemorrhage in the setting of Xarelto use.  Given that it is unclear if he took his Xarelto tonight or not, will reverse with a higher dose.  He will need to be admitted with close monitoring and BP control given his anticoagulation status.  Recommendations: 1) Admit to ICU 2) no antiplatelets or anticoagulants 3) blood pressure control with goal systolic 130 - 150 4) Frequent neuro checks 5) If symptoms worsen or there is decreased mental status, repeat stat head CT 6) PT,OT,ST   This patient is critically ill and at significant risk of neurological worsening, death and care requires constant monitoring of vital signs, hemodynamics,respiratory and cardiac monitoring, neurological assessment, discussion with family, other specialists and medical decision making of high complexity. I spent 55 minutes of neurocritical care time  in the care of  this patient. This was time spent independent of any time provided by nurse practitioner or PA.  Ritta Slot, MD [facility] [phone]  If  7pm- 7am, please page neurology on call as listed in AMION. 04/26/2023  1:53 AM

## 2023-04-25 NOTE — ED Notes (Signed)
Son TIMM BOYNE [phone] would like an update asap

## 2023-04-25 NOTE — ED Notes (Signed)
ED TO INPATIENT HANDOFF REPORT  ED Nurse Name and Phone #: Gillis Ends #[phone]  S Name/Age/Gender Mason [name] 77 y.o. male Room/Bed: 008C/008C  Code Status   Code Status: Full Code  Home/SNF/Other Rehab Patient oriented to: self, place, time, and situation Is this baseline? 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  Patient right arm and right leg weak.  18 L AC 16 R FA   Allergies Allergies  Allergen Reactions   Chlordiazepoxide-Clidinium     REACTION: urinary retention   Citalopram Hydrobromide     Intolerant, tried 03/2011.  "It made me very angry".   Erythromycin Base     REACTION: stomach upset   Hyoscyamine Sulfate     REACTION: urinary retention   Meloxicam     GI upset   Penicillins     REACTION: Itching, rash   Shrimp [Shellfish Allergy]     And crab- rash, GI upset, upper airway symptoms    Level of Care/Admitting Diagnosis ED Disposition     ED Disposition  Admit   Condition  --   Comment  Hospital Area: [facility] [[id number]]  Level of Care: ICU [6]  May admit patient to [facility] or [facility] if equivalent level of care is available:: No  Covid Evaluation: Confirmed COVID Negative  Diagnosis: Stroke (cerebrum) [facility]) [[id number]]  Admitting Physician: Rejeana Brock [[id number]]  Attending Physician: Amada Jupiter, MCNEILL P [[id number]]  Certification:: I certify this patient will need inpatient services for at least 2 midnights  Expected Medical Readiness: 04/27/2023          B Medical/Surgery History Past Medical History:  Diagnosis Date   Actinic keratosis 04/02/2013   L sup scapula - bx proven   Actinic keratosis 04/02/2013   R med ant deltoid - bx proven   Allergy    Anal fissure    Anxiety    Basal cell carcinoma    back - treated in the past    Basal cell carcinoma 05/02/2009   left supratip, Mohs   Cancer (HCC)    basal cell  removed x4   Cataract    bil eyes   Depression    Diverticulosis of colon    GERD (gastroesophageal reflux disease)    esophageal narrowing   Glaucoma    Hiatal hernia    IBS (irritable bowel syndrome)    Insomnia    Internal hemorrhoids    Past Surgical History:  Procedure Laterality Date   ANAL SPHINCTEROTOMY  03/1995   fistulotomy   APPENDECTOMY     BASAL CELL CARCINOMA EXCISION     on back   BICEPT TENODESIS Right 09/27/2004   decompression   COLONOSCOPY  04/1999   internal hemmroids,12-11-08 colon tics and hems only    ESOPHAGOGASTRODUODENOSCOPY  04/1999   with esophagitis and stricture   MOHS SURGERY  09/02/2008   L supratip of nose with flap,basal cell cancer [facility]/MOHS   MOUTH SURGERY  01/2020   TONGUE SURGERY  2017   had small place removed, noncancerous   TONSILLECTOMY AND ADENOIDECTOMY     remote   UPPER GASTROINTESTINAL ENDOSCOPY     WRIST SURGERY Right    WRIST SURGERY     scar tissue removed     A IV Location/Drains/Wounds Patient Lines/Drains/Airways Status     Active Line/Drains/Airways     Name Placement date Placement time Site Days  Peripheral IV 04/25/23 16 G Posterior;Right Wrist 04/25/23  2135  Wrist  less than 1   Peripheral IV 04/25/23 18 G Left Antecubital 04/25/23  2152  Antecubital  less than 1            Intake/Output Last 24 hours No intake or output data in the 24 hours ending 04/25/23 2214  Labs/Imaging Results for orders placed or performed during the hospital encounter of 04/25/23 (from the past 48 hour(s))  Ethanol     Status: None   Collection Time: 04/25/23  9:06 PM  Result Value Ref Range   Alcohol, Ethyl (B) <10 <10 mg/dL    Comment: (NOTE) Lowest detectable limit for serum alcohol is 10 mg/dL.  For medical purposes only. Performed at [facility], [address], [place], [place] [address]   Protime-INR     Status: None   Collection Time: 04/25/23  9:06 PM  Result Value Ref Range   Prothrombin Time  14.8 11.4 - 15.2 seconds   INR 1.1 0.8 - 1.2    Comment: (NOTE) INR goal varies based on device and disease states. Performed at [facility], [address], [place], [place] [address]   APTT     Status: None   Collection Time: 04/25/23  9:06 PM  Result Value Ref Range   aPTT 36 24 - 36 seconds    Comment: Performed at [facility], [address] [place], [place] [address]  CBC     Status: Abnormal   Collection Time: 04/25/23  9:06 PM  Result Value Ref Range   WBC 14.8 (H) 4.0 - 10.5 K/uL   RBC 5.74 4.22 - 5.81 MIL/uL   Hemoglobin 16.2 13.0 - 17.0 g/dL   HCT 01.0 27.2 - 53.6 %   MCV 86.1 80.0 - 100.0 fL   MCH 28.2 26.0 - 34.0 pg   MCHC 32.8 30.0 - 36.0 g/dL   RDW 64.4 03.4 - 74.2 %   Platelets 515 (H) 150 - 400 K/uL   nRBC 0.0 0.0 - 0.2 %    Comment: Performed at [facility], [address] [place], [place] [address]  Differential     Status: Abnormal   Collection Time: 04/25/23  9:06 PM  Result Value Ref Range   Neutrophils Relative % 64 %   Neutro Abs 9.6 (H) 1.7 - 7.7 K/uL   Lymphocytes Relative 23 %   Lymphs Abs 3.4 0.7 - 4.0 K/uL   Monocytes Relative 7 %   Monocytes Absolute 1.0 0.1 - 1.0 K/uL   Eosinophils Relative 4 %   Eosinophils Absolute 0.5 0.0 - 0.5 K/uL   Basophils Relative 1 %   Basophils Absolute 0.2 (H) 0.0 - 0.1 K/uL   Immature Granulocytes 1 %   Abs Immature Granulocytes 0.12 (H) 0.00 - 0.07 K/uL    Comment: Performed at [facility], [address], [place], [place] [address]  Comprehensive metabolic panel     Status: Abnormal   Collection Time: 04/25/23  9:06 PM  Result Value Ref Range   Sodium 133 (L) 135 - 145 mmol/L   Potassium 5.7 (H) 3.5 - 5.1 mmol/L    Comment: HEMOLYSIS AT THIS LEVEL MAY AFFECT RESULT   Chloride 98 98 - 111 mmol/L   CO2 25 22 - 32 mmol/L   Glucose, Bld 105 (H) 70 - 99 mg/dL    Comment: Glucose reference range applies only to samples taken after fasting for  at least 8 hours.   BUN 17 8 - 23  mg/dL   Creatinine, Ser 6.04 0.61 - 1.24 mg/dL   Calcium 8.7 (L) 8.9 - 10.3 mg/dL   Total Protein 6.4 (L) 6.5 - 8.1 g/dL   Albumin 3.4 (L) 3.5 - 5.0 g/dL   AST 76 (H) 15 - 41 U/L    Comment: HEMOLYSIS AT THIS LEVEL MAY AFFECT RESULT   ALT 18 0 - 44 U/L    Comment: HEMOLYSIS AT THIS LEVEL MAY AFFECT RESULT   Alkaline Phosphatase 64 38 - 126 U/L    Comment: HEMOLYSIS AT THIS LEVEL MAY AFFECT RESULT   Total Bilirubin 1.2 0.3 - 1.2 mg/dL    Comment: HEMOLYSIS AT THIS LEVEL MAY AFFECT RESULT   GFR, Estimated >60 >60 mL/min    Comment: (NOTE) Calculated using the CKD-EPI Creatinine Equation (2021)    Anion gap 10 5 - 15    Comment: Performed at [facility], [address] [place], [place] [address]  I-stat chem 8, ED     Status: Abnormal   Collection Time: 04/25/23  9:07 PM  Result Value Ref Range   Sodium 135 135 - 145 mmol/L   Potassium 5.4 (H) 3.5 - 5.1 mmol/L   Chloride 99 98 - 111 mmol/L   BUN 25 (H) 8 - 23 mg/dL   Creatinine, Ser 1.19 0.61 - 1.24 mg/dL   Glucose, Bld 147 (H) 70 - 99 mg/dL    Comment: Glucose reference range applies only to samples taken after fasting for at least 8 hours.   Calcium, Ion 1.04 (L) 1.15 - 1.40 mmol/L   TCO2 28 22 - 32 mmol/L   Hemoglobin 17.3 (H) 13.0 - 17.0 g/dL   HCT 82.9 56.2 - 13.0 %   CT HEAD CODE STROKE WO CONTRAST  Result Date: 04/25/2023 CLINICAL DATA:  Code stroke. Initial evaluation for neuro deficit, stroke, right-sided weakness. EXAM: CT HEAD WITHOUT CONTRAST TECHNIQUE: Contiguous axial images were obtained from the base of the skull through the vertex without intravenous contrast. RADIATION DOSE REDUCTION: This exam was performed according to the departmental dose-optimization program which includes automated exposure control, adjustment of the mA and/or kV according to patient size and/or use of iterative reconstruction technique. COMPARISON:  Prior study from 06/29/2018. FINDINGS: Brain: Mild age-related cerebral atrophy with  chronic small vessel ischemic disease. Acute intraparenchymal hemorrhage centered at the left thalamic capsular region measures 2.6 x 1.6 x 1.3 cm (estimated volume 3 mL). Minor localized edema without significant regional mass effect. No intraventricular extension. No other acute intracranial hemorrhage or large vessel territory infarct. No mass lesion or midline shift. No hydrocephalus or extra-axial fluid collection. Vascular: No abnormal hyperdense vessel. Skull: Scalp soft tissues and calvarium demonstrate no acute finding. Sinuses/Orbits: Globes orbital soft tissues within normal limits. Mild scattered mucosal thickening noted about the ethmoidal air cells and maxillary sinuses. No mastoid effusion. Other: None. IMPRESSION: 1. 2.6 x 1.6 x 1.3 cm (estimated volume 3 mL) acute intraparenchymal hematoma at the left thalamic capsular region. No intraventricular extension or other complicating features. 2. Underlying age-related cerebral atrophy with chronic small vessel ischemic disease. These results were communicated to Dr. Amada Jupiter at 9:17 pm on 04/25/2023 by text page via the [facility] messaging system. Electronically Signed   By: Rise Mu M.D.   On: 04/25/2023 21:19    Pending Labs Unresulted Labs (From admission, onward)     Start     Ordered   04/25/23 2100  Urine  rapid drug screen (hosp performed)  Once,   STAT        04/25/23 2059   04/25/23 2100  Urinalysis, Routine w reflex microscopic -Urine, Clean Catch  Once,   URGENT       Question:  Specimen Source  Answer:  Urine, Clean Catch   04/25/23 2059            Vitals/Pain Today's Vitals   04/25/23 2106 04/25/23 2116 04/25/23 2130  BP:  (!) 178/105 (!) 167/110  Pulse:  66 (!) 55  Resp:   10  SpO2:   99%  Weight: 81.8 kg      Isolation Precautions No active isolations  Medications Medications  clevidipine (CLEVIPREX) infusion 0.5 mg/mL (2 mg/hr Intravenous New Bag/Given 04/25/23 2136)   stroke: early stages of  recovery book (has no administration in time range)  acetaminophen (TYLENOL) tablet 650 mg (has no administration in time range)    Or  acetaminophen (TYLENOL) 160 MG/5ML solution 650 mg (has no administration in time range)    Or  acetaminophen (TYLENOL) suppository 650 mg (has no administration in time range)  senna-docusate (Senokot-S) tablet 1 tablet (has no administration in time range)  pantoprazole (PROTONIX) injection 40 mg (has no administration in time range)  coag fact Xa recombinant (ANDEXXA) high dose infusion 1800 mg (1,800 mg Intravenous New Bag/Given 04/25/23 2152)    Mobility non-ambulatory     Focused Assessments Neuro Assessment Handoff:  Swallow screen pass? No          Neuro Assessment:   Neuro Checks:      Has TPA been given? No If patient is a Neuro Trauma and patient is going to OR before floor call report to 4N Charge nurse: [phone] or [phone]   R Recommendations: See Admitting Provider Note  Report given to:   Additional Notes:

## 2023-04-25 NOTE — ED Provider Notes (Signed)
[place] EMERGENCY DEPARTMENT AT [facility] Provider Note   CSN: [id number] Arrival date & time: 04/25/23  2059     History  No chief complaint on file.   TRASON LEFAVE is a 77 y.o. male history of A-fib on Xarelto, diverticulitis, here presenting with right arm and leg weakness.  Patient was watching TV and had sudden onset of right arm and leg weakness at 8 PM.  Patient also has some trouble speaking as well.  Code stroke was activated by EMS.  Patient just took his Xarelto this morning.  The history is provided by the patient.       Home Medications Prior to Admission medications   Medication Sig Start Date End Date Taking? Authorizing Provider  azelastine (ASTELIN) 0.1 % nasal spray Place 1 spray into both nostrils 2 (two) times daily. Use in each nostril as directed 12/01/22   Joaquim Nam, MD  b complex vitamins capsule Take 1 capsule by mouth daily.    [provider]  bimatoprost (LUMIGAN) 0.01 % SOLN Apply 1 drop to eye daily.    [provider]  Cholecalciferol (VITAMIN D) 125 MCG (5000 UT) CAPS Take 1 tablet by mouth daily.    [provider]  esomeprazole (NEXIUM) 40 MG capsule Take 1 capsule by mouth twice daily 02/08/23   Joaquim Nam, MD  levocetirizine (XYZAL) 5 MG tablet TAKE 1 TABLET BY MOUTH EVERY DAY IN THE EVENING 01/25/23   Joaquim Nam, MD  mometasone (ELOCON) 0.1 % cream Apply to affected skin on the neck qd-bid prn irritation 05/05/22   Deirdre Evener, MD  predniSONE (DELTASONE) 10 MG tablet TAKE 2 A DAY FOR 5 DAYS, THEN 1 A DAY FOR 5 DAYS, WITH FOOD. DON'T TAKE WITH ALEVE/IBUPROFEN. 04/14/23   Joaquim Nam, MD  rivaroxaban (XARELTO) 20 MG TABS tablet Take 1 tablet (20 mg total) by mouth daily with supper. 12/30/22   Debbe Odea, MD  tamsulosin (FLOMAX) 0.4 MG CAPS capsule TAKE 1-2 CAPSULES (0.4- 0.8 MG TOTAL) BY MOUTH DAILY. 11/23/22   Joaquim Nam, MD  timolol (TIMOPTIC-XR) 0.5 % ophthalmic  gel-forming Place 1 drop into both eyes every morning. 07/15/19   [provider]  vitamin C (ASCORBIC ACID) 500 MG tablet Take 500 mg by mouth daily.    [provider]  zinc gluconate 50 MG tablet Take 50 mg by mouth daily.    [provider]      Allergies    Chlordiazepoxide-clidinium, Citalopram hydrobromide, Erythromycin base, Hyoscyamine sulfate, Meloxicam, Penicillins, and Shrimp [shellfish allergy]    Review of Systems   Review of Systems  Neurological:  Positive for weakness.  All other systems reviewed and are negative.   Physical Exam Updated Vital Signs BP (!) 178/105   Pulse 66   Wt 81.8 kg   BMI 26.25 kg/m  Physical Exam Vitals and nursing note reviewed.  HENT:     Head: Normocephalic.     Nose: Nose normal.     Mouth/Throat:     Mouth: Mucous membranes are moist.  Eyes:     Extraocular Movements: Extraocular movements intact.     Pupils: Pupils are equal, round, and reactive to light.  Cardiovascular:     Rate and Rhythm: Normal rate and regular rhythm.     Pulses: Normal pulses.     Heart sounds: Normal heart sounds.  Pulmonary:     Effort: Pulmonary effort is normal.     Breath  sounds: Normal breath sounds.  Abdominal:     General: Abdomen is flat.     Palpations: Abdomen is soft.  Musculoskeletal:        General: Normal range of motion.     Cervical back: Normal range of motion and neck supple.  Skin:    General: Skin is warm.  Neurological:     Mental Status: He is alert.     Comments: No obvious facial droop.  No obvious visual field cut.  Patient does have strength of 2 out of 5 of the right arm and 3 out of 5 of the right leg.  Patient has normal strength left arm and leg.  Psychiatric:        Mood and Affect: Mood normal.        Behavior: Behavior normal.     ED Results / Procedures / Treatments   Labs (all labs ordered are listed, but only abnormal results are displayed) Labs Reviewed  CBC - Abnormal;  Notable for the following components:      Result Value   WBC 14.8 (*)    Platelets 515 (*)    All other components within normal limits  DIFFERENTIAL - Abnormal; Notable for the following components:   Neutro Abs 9.6 (*)    Basophils Absolute 0.2 (*)    Abs Immature Granulocytes 0.12 (*)    All other components within normal limits  I-STAT CHEM 8, ED - Abnormal; Notable for the following components:   Potassium 5.4 (*)    BUN 25 (*)    Glucose, Bld 103 (*)    Calcium, Ion 1.04 (*)    Hemoglobin 17.3 (*)    All other components within normal limits  ETHANOL  PROTIME-INR  APTT  COMPREHENSIVE METABOLIC PANEL  RAPID URINE DRUG SCREEN, HOSP PERFORMED  URINALYSIS, ROUTINE W REFLEX MICROSCOPIC    EKG None  Radiology CT HEAD CODE STROKE WO CONTRAST  Result Date: 04/25/2023 CLINICAL DATA:  Code stroke. Initial evaluation for neuro deficit, stroke, right-sided weakness. EXAM: CT HEAD WITHOUT CONTRAST TECHNIQUE: Contiguous axial images were obtained from the base of the skull through the vertex without intravenous contrast. RADIATION DOSE REDUCTION: This exam was performed according to the departmental dose-optimization program which includes automated exposure control, adjustment of the mA and/or kV according to patient size and/or use of iterative reconstruction technique. COMPARISON:  Prior study from 06/29/2018. FINDINGS: Brain: Mild age-related cerebral atrophy with chronic small vessel ischemic disease. Acute intraparenchymal hemorrhage centered at the left thalamic capsular region measures 2.6 x 1.6 x 1.3 cm (estimated volume 3 mL). Minor localized edema without significant regional mass effect. No intraventricular extension. No other acute intracranial hemorrhage or large vessel territory infarct. No mass lesion or midline shift. No hydrocephalus or extra-axial fluid collection. Vascular: No abnormal hyperdense vessel. Skull: Scalp soft tissues and calvarium demonstrate no acute finding.  Sinuses/Orbits: Globes orbital soft tissues within normal limits. Mild scattered mucosal thickening noted about the ethmoidal air cells and maxillary sinuses. No mastoid effusion. Other: None. IMPRESSION: 1. 2.6 x 1.6 x 1.3 cm (estimated volume 3 mL) acute intraparenchymal hematoma at the left thalamic capsular region. No intraventricular extension or other complicating features. 2. Underlying age-related cerebral atrophy with chronic small vessel ischemic disease. These results were communicated to Dr. Amada Jupiter at 9:17 pm on 04/25/2023 by text page via the [facility] messaging system. Electronically Signed   By: Rise Mu M.D.   On: 04/25/2023 21:19    Procedures Procedures    CRITICAL  CARE Performed by: Richardean Canal   Total critical care time: 41 minutes  Critical care time was exclusive of separately billable procedures and treating other patients.  Critical care was necessary to treat or prevent imminent or life-threatening deterioration.  Critical care was time spent personally by me on the following activities: development of treatment plan with patient and/or surrogate as well as nursing, discussions with consultants, evaluation of patient's response to treatment, examination of patient, obtaining history from patient or surrogate, ordering and performing treatments and interventions, ordering and review of laboratory studies, ordering and review of radiographic studies, pulse oximetry and re-evaluation of patient's condition.   Medications Ordered in ED Medications  coag fact Xa recombinant (ANDEXXA) high dose infusion 1800 mg (has no administration in time range)  clevidipine (CLEVIPREX) infusion 0.5 mg/mL (has no administration in time range)   stroke: early stages of recovery book (has no administration in time range)  acetaminophen (TYLENOL) tablet 650 mg (has no administration in time range)    Or  acetaminophen (TYLENOL) 160 MG/5ML solution 650 mg (has no administration  in time range)    Or  acetaminophen (TYLENOL) suppository 650 mg (has no administration in time range)  senna-docusate (Senokot-S) tablet 1 tablet (has no administration in time range)  pantoprazole (PROTONIX) injection 40 mg (has no administration in time range)    ED Course/ Medical Decision Making/ A&P                                 Medical Decision Making RICHIE BREITENBACH is a 77 y.o. male here presenting with right-sided weakness.  Last normal was 8 PM.  Code stroke was activated by EMS and Dr. Amada Jupiter was at bedside  9:31 PM I reviewed patient's CT and it showed 2 x 1 x 1 cm intraparenchymal hematoma in the left thalamic area.  Patient is started on Cleviprex drip.  Patient was also ordered Kcentra to reverse the Xarelto.  Patient will be admitted to the neuro ICU.   Problems Addressed: Intraparenchymal hemorrhage of brain [facility]): acute illness or injury  Amount and/or Complexity of Data Reviewed Labs: ordered. Decision-making details documented in ED Course. Radiology: ordered and independent interpretation performed. Decision-making details documented in ED Course.  Risk Decision regarding hospitalization.   Final Clinical Impression(s) / ED Diagnoses Final diagnoses:  None    Rx / DC Orders ED Discharge Orders     None         Charlynne Pander, MD 04/25/23 2132

## 2023-04-26 ENCOUNTER — Inpatient Hospital Stay (HOSPITAL_COMMUNITY): Payer: Medicare Other

## 2023-04-26 DIAGNOSIS — I610 Nontraumatic intracerebral hemorrhage in hemisphere, subcortical: Secondary | ICD-10-CM | POA: Diagnosis not present

## 2023-04-26 DIAGNOSIS — I6389 Other cerebral infarction: Secondary | ICD-10-CM

## 2023-04-26 LAB — URINALYSIS, ROUTINE W REFLEX MICROSCOPIC
Bacteria, UA: NONE SEEN
Bilirubin Urine: NEGATIVE
Glucose, UA: NEGATIVE mg/dL
Hgb urine dipstick: NEGATIVE
Ketones, ur: 5 mg/dL — AB
Leukocytes,Ua: NEGATIVE
Nitrite: NEGATIVE
Protein, ur: 30 mg/dL — AB
Specific Gravity, Urine: 1.011 (ref 1.005–1.030)
pH: 8 (ref 5.0–8.0)

## 2023-04-26 LAB — BASIC METABOLIC PANEL
Anion gap: 7 (ref 5–15)
BUN: 16 mg/dL (ref 8–23)
CO2: 26 mmol/L (ref 22–32)
Calcium: 8.6 mg/dL — ABNORMAL LOW (ref 8.9–10.3)
Chloride: 100 mmol/L (ref 98–111)
Creatinine, Ser: 0.92 mg/dL (ref 0.61–1.24)
GFR, Estimated: >60 mL/min (ref >60)
Glucose, Bld: 110 mg/dL — ABNORMAL HIGH (ref 70–99)
Potassium: 4.3 mmol/L (ref 3.5–5.1)
Sodium: 133 mmol/L — ABNORMAL LOW (ref 135–145)

## 2023-04-26 LAB — ECHOCARDIOGRAM COMPLETE
Area-P 1/2: 3.77 cm2
MV M vel: 0.83 m/s
MV Peak grad: 2.7 mmHg
S' Lateral: 2.7 cm
Weight: 2885.38 [oz_av]

## 2023-04-26 LAB — RAPID URINE DRUG SCREEN, HOSP PERFORMED
Amphetamines: NOT DETECTED
Barbiturates: NOT DETECTED
Benzodiazepines: NOT DETECTED
Cocaine: NOT DETECTED
Opiates: NOT DETECTED
Tetrahydrocannabinol: NOT DETECTED

## 2023-04-26 LAB — CBC
HCT: 51.2 % (ref 39.0–52.0)
Hemoglobin: 17 g/dL (ref 13.0–17.0)
MCH: 28.4 pg (ref 26.0–34.0)
MCHC: 33.2 g/dL (ref 30.0–36.0)
MCV: 85.6 fL (ref 80.0–100.0)
Platelets: 518 10*3/uL — ABNORMAL HIGH (ref 150–400)
RBC: 5.98 MIL/uL — ABNORMAL HIGH (ref 4.22–5.81)
RDW: 15.3 % (ref 11.5–15.5)
WBC: 19.6 10*3/uL — ABNORMAL HIGH (ref 4.0–10.5)
nRBC: 0 % (ref 0.0–0.2)

## 2023-04-26 LAB — HEMOGLOBIN A1C
Hgb A1c MFr Bld: 6 % — ABNORMAL HIGH (ref 4.8–5.6)
Mean Plasma Glucose: 125.5 mg/dL

## 2023-04-26 LAB — MRSA NEXT GEN BY PCR, NASAL: MRSA by PCR Next Gen: NOT DETECTED

## 2023-04-26 MED ORDER — TIMOLOL MALEATE 0.5 % OP SOLG
1.0000 [drp] | Freq: Every morning | OPHTHALMIC | Status: DC
  Administered 2023-04-26 – 2023-05-05 (×10): 1 [drp] via OPHTHALMIC
  Filled 2023-04-26: qty 5

## 2023-04-26 MED ORDER — LABETALOL HCL 5 MG/ML IV SOLN
20.0000 mg | INTRAVENOUS | Status: DC | PRN
  Administered 2023-04-26: 20 mg via INTRAVENOUS
  Filled 2023-04-26: qty 4

## 2023-04-26 MED ORDER — IOHEXOL 350 MG/ML SOLN
75.0000 mL | Freq: Once | INTRAVENOUS | Status: AC | PRN
  Administered 2023-04-26: 75 mL via INTRAVENOUS

## 2023-04-26 MED ORDER — HYDRALAZINE HCL 20 MG/ML IJ SOLN
5.0000 mg | INTRAMUSCULAR | Status: DC | PRN
  Administered 2023-04-26: 5 mg via INTRAVENOUS
  Filled 2023-04-26: qty 1

## 2023-04-26 MED ORDER — LATANOPROST 0.005 % OP SOLN
1.0000 [drp] | Freq: Every day | OPHTHALMIC | Status: DC
  Administered 2023-04-26 – 2023-05-04 (×9): 1 [drp] via OPHTHALMIC
  Filled 2023-04-26 (×2): qty 2.5

## 2023-04-26 MED ORDER — HYDRALAZINE HCL 20 MG/ML IJ SOLN
5.0000 mg | INTRAMUSCULAR | Status: DC | PRN
  Administered 2023-04-26 – 2023-04-27 (×2): 5 mg via INTRAVENOUS
  Filled 2023-04-26 (×2): qty 1

## 2023-04-26 MED ORDER — LABETALOL HCL 5 MG/ML IV SOLN
20.0000 mg | INTRAVENOUS | Status: DC | PRN
  Administered 2023-04-26 – 2023-04-27 (×3): 20 mg via INTRAVENOUS
  Filled 2023-04-26 (×3): qty 4

## 2023-04-26 MED ORDER — TAMSULOSIN HCL 0.4 MG PO CAPS: 0.4000 mg | ORAL_CAPSULE | Freq: Every day | ORAL | Status: DC

## 2023-04-26 NOTE — Progress Notes (Signed)
OT Cancellation Note  Patient Details Name: Mason [name] MRN: [id number] DOB: 01/23/1946   Cancelled Treatment:    Reason Eval/Treat Not Completed: Active bedrest order- will follow.  OT will when as appropriate and able.   Barry Brunner, OT Acute Rehabilitation Services Office [phone]   Chancy Milroy 04/26/2023, 8:16 AM

## 2023-04-26 NOTE — Procedures (Signed)
Modified Barium Swallow Study  Patient Details  Name: Mason [name] MRN: [id number] Date of Birth: 10-Sep-1945  Today's Date: 04/26/2023  Modified Barium Swallow completed.  Full report located under Chart Review in the Imaging Section.  History of Present Illness Pt is a 77 y.o. male who presneted on 04/25/23 with right sided weakness and difficulty speaking. CT head revealed acute intraparenchymal hematoma at the left thalamic capsular region.   Significant PMH includes diverticulitius, GERD, A-fib.   Clinical Impression Pt seen by SLP for modified barium swallow study following bedside swallow evaluation post stroke. Pt presents with a moderate oropharyngeal dysphagia primarily chracterised by mistiming and coordination of epiglottic inversion and laryngeal vestibule closure resulting in silent aspiration of thin liquids. Dysfunction of coordination can likely be attributed to impairment of motor planning and sensation of swallowing mechanisms as a result of stroke. Gross silent aspiration and penetration occured during thin and nectar consistency adminstration. Mistiming of epiglottic inversion resulted in more than 50% bolus entering airway. Aspiration did not ellicit cough response. When cued to cough, aspirate did eject from airway. No aspiration observed with honey thick, puree, or solid consistencies. Belching and hiccuping observed at end of study, which can be attributted to pt hx of GERD. Chin tuck manuever effectively prevented bolus from entering airway with nectar thick, but penetration did occur. Pt did not effectievly utilize chin tuck, as he would untuck his chin when swallow was initiated. Chin tuck will likely be an effective compensatory strategy given cognitive status improves. SLP reccommendation of dys 2 (minced) and honey thick liquid. ST will continue to f/u to ensure diet toleration, use of compensatory strategies, and upgraded PO trials. Factors that may increase risk of  adverse event in presence of aspiration Rubye Oaks & Clearance Coots 2021): Reduced cognitive function  Swallow Evaluation Recommendations Recommendations: PO diet PO Diet Recommendation: Dysphagia 2 (Finely chopped);Moderately thick liquids (Level 3, honey thick) Liquid Administration via: Spoon;Cup Medication Administration: Whole meds with puree Supervision: Full supervision/cueing for swallowing strategies;Full assist for feeding Swallowing strategies  : Slow rate;Small bites/sips;Chin tuck Postural changes: Position pt fully upright for meals Oral care recommendations: Oral care BID (2x/day);Staff/trained caregiver to provide oral care     Marline Backbone, Senaida Lange., Speech Therapy Student

## 2023-04-26 NOTE — Progress Notes (Signed)
Inpatient Rehab Admissions Coordinator:   Per therapy recommendations, patient was screened for CIR candidacy by Clemens Catholic, MS, CCC-SLP . At this time, Pt. is not at a level to tolerate the intensity of CIR; however,   Pt. may have potential to progress to becoming a potential CIR candidate, so CIR admissions team will follow and monitor for progress and participation with therapies and place consult order if Pt. appears to be an appropriate candidate. Please contact me with any questions.    Clemens Catholic, [place], [place] Admissions Coordinator  [phone] ([place]) [phone] (office)

## 2023-04-26 NOTE — Evaluation (Signed)
Physical Therapy Evaluation Patient Details Name: Mason [name] MRN: [id number] DOB: [birth year] Today's Date: 04/26/2023  History of Present Illness  Pt is a 77 y.o. male who presented on 04/25/23 with right sided weakness and difficulty speaking. CT head with L subcortical hemorrhage in setting of Xarelto use.   Significant PMH includes diverticulitius, GERD, A-fib.  Clinical Impression  Pt admitted with above diagnosis. Pt normally lives alone, independent, and reports working. Today, pt with R side mostly flaccid - he did have some tone with transfers and yawning.  He additionally had decreased cognition and R side inattention that varied throughout evaluation with improvement toward end of session.  Pt requiring max A of 2 for transition to EOB and max A to maintain balance with R/posterior lean.  He was unable to progress to OOB this session.   Pt currently with functional limitations due to the deficits listed below (see PT Problem List). Pt will benefit from acute skilled PT to increase their independence and safety with mobility to allow discharge.  Pt with good rehab potential.  Patient will benefit from intensive inpatient follow up therapy, >3 hours/day at d/c.          If plan is discharge home, recommend the following: Two people to help with walking and/or transfers;Two people to help with bathing/dressing/bathroom   Can travel by private vehicle        Equipment Recommendations Other (comment) (defer to post acute)  Recommendations for Other Services  Rehab consult    Functional Status Assessment Patient has had a recent decline in their functional status and demonstrates the ability to make significant improvements in function in a reasonable and predictable amount of time.     Precautions / Restrictions Precautions Precautions: Fall Precaution Comments: R hemiparesis Restrictions Weight Bearing Restrictions: No      Mobility  Bed Mobility Overal bed mobility:  Needs Assistance Bed Mobility: Supine to Sit, Sit to Supine, Rolling Rolling: Mod assist, Max assist   Supine to sit: +2 for physical assistance, Mod assist Sit to supine: Max assist, +2 for physical assistance   General bed mobility comments: Rolling to R with max cues/mod A; Rolling to L with max cues/max A; Supine to sit: pt did reach for rail L UE, slid L leg over, started to lift but then required mod x 2 to complete; Sit to supine: max A x 2 with increased tone in R LE    Transfers                   General transfer comment: deferred/unable for OOB; was max x 2 to lateral scoot toward HOB using bed pad    Ambulation/Gait                  Stairs            Wheelchair Mobility     Tilt Bed    Modified Rankin (Stroke Patients Only)       Balance Overall balance assessment: Needs assistance Sitting-balance support: No upper extremity supported, Feet supported, Single extremity supported Sitting balance-Leahy Scale: Poor Sitting balance - Comments: relies on external support, R lateral lean initally and then posterior. Tends to push with L hand towards R side. Requiring mod to max A and frequent cues Postural control: Posterior lean, Right lateral lean  Pertinent Vitals/Pain Pain Assessment Pain Assessment: No/denies pain    Home Living Family/patient expects to be discharged to:: Private residence Living Arrangements: Alone Available Help at Discharge: Family;Available PRN/intermittently (son)-son flying in from [place] Type of Home: House Home Access: Stairs to enter   [employer] of Steps: 3   Home Layout: One level Home Equipment: None Additional Comments: Pt is questionable historian and no family present; per RN son is from [place] and is flying in today 9/4    Prior Function Prior Level of Function : Independent/Modified Independent;Working/employed;Driving               ADLs  Comments: retired but driving     Extremity/Trunk Assessment   Upper Extremity Assessment Upper Extremity Assessment: Defer to OT evaluation (R UE flaccid, did have flexor tone with a yawn, defer to OT further detail) RUE Deficits / Details: flaccid, flexor synergy pattern (associated reaction) seen when yawning.  Does not withdrawl to noxious stimuli, 1 finger width sublux noted RUE Sensation: decreased light touch;decreased proprioception RUE Coordination: decreased fine motor;decreased gross motor    Lower Extremity Assessment Lower Extremity Assessment: RLE deficits/detail;LLE deficits/detail RLE Deficits / Details: ROM WFL; MMT: 0/5; Did have extensor tone when returned to bed.  Sensation appears to be absent to light touch and painful stimuli (nail bed squeeze) LLE Deficits / Details: ROM WFL; MMT 5/5    Cervical / Trunk Assessment Cervical / Trunk Assessment: Other exceptions Cervical / Trunk Exceptions: Posture normal; poor trunk control/strength/balance  Communication   Communication Communication: Difficulty communicating thoughts/reduced clarity of speech;Difficulty following commands/understanding Following commands: Follows one step commands inconsistently;Follows one step commands with increased time;Follows multi-step commands inconsistently Cueing Techniques: Verbal cues;Gestural cues;Tactile cues;Visual cues  Cognition Arousal: Alert Behavior During Therapy: WFL for tasks assessed/performed Overall Cognitive Status: Difficult to assess Area of Impairment: Orientation, Problem solving, Attention, Memory, Following commands, Awareness                 Orientation Level: Disoriented to, Place, Time, Situation Current Attention Level: Sustained   Following Commands: Follows one step commands inconsistently   Awareness: Intellectual Problem Solving: Slow processing, Decreased initiation, Difficulty sequencing, Requires verbal cues, Requires tactile cues General  Comments: Cognition varied throughout evaluation. He did have R inattention with tendency to L gaze but improved some throughout evaluation. Pt seemed to provide accurate PLOF according to chart review, answering vision question appropriate, provided detail about hand dominance, but then not able to answer other questions appropriately (ex birthday).  Requiring increased time to follow commands and repeated cues with transfers and coordination testing.  Pt did have some safety awareness - aware not safe to go home, realized he was falling forward when attempting to do socks and sat up.        General Comments General comments (skin integrity, edema, etc.): VSS on RA; BP post 147/97; HR 70's; Pt was cleared for PT/OT by NP prior to evaluation.  Pt did have R inattention.  Tending to look left initially but then shifting to midline without cues by the end of the session.  He initially had difficulty maintaining R gaze when cued but end of session maintained R gaze to answer therapist question on R side.  Pt did hold R hand with L when cued and was moving R fingers with L hand when sitting.    Exercises     Assessment/Plan    PT Assessment Patient needs continued PT services  PT Problem List Decreased strength;Decreased coordination;Decreased range  of motion;Decreased activity tolerance;Decreased balance;Decreased mobility;Decreased knowledge of precautions;Decreased safety awareness;Decreased knowledge of use of DME;Decreased cognition;Impaired sensation;Impaired tone       PT Treatment Interventions DME instruction;Therapeutic exercise;Gait training;Balance training;Neuromuscular re-education;Functional mobility training;Therapeutic activities;Patient/family education;Modalities    PT Goals (Current goals can be found in the Care Plan section)  Acute Rehab PT Goals Patient Stated Goal: unable to state PT Goal Formulation: With patient Time For Goal Achievement: 05/10/23 Potential to Achieve  Goals: Good Additional Goals Additional Goal #1: Will icnrease R LE strength to 2/5 to assist with transfers    Frequency Min 1X/week     Co-evaluation PT/OT/SLP Co-Evaluation/Treatment: Yes Reason for Co-Treatment: For patient/therapist safety;To address functional/ADL transfers;Necessary to address cognition/behavior during functional activity;Complexity of the patient's impairments (multi-system involvement) PT goals addressed during session: Mobility/safety with mobility;Balance OT goals addressed during session: ADL's and self-care       AM-PAC PT "6 Clicks" Mobility  Outcome Measure Help needed turning from your back to your side while in a flat bed without using bedrails?: A Lot Help needed moving from lying on your back to sitting on the side of a flat bed without using bedrails?: Total Help needed moving to and from a bed to a chair (including a wheelchair)?: Total Help needed standing up from a chair using your arms (e.g., wheelchair or bedside chair)?: Total Help needed to walk in hospital room?: Total Help needed climbing 3-5 steps with a railing? : Total 6 Click Score: 7    End of Session   Activity Tolerance: Patient tolerated treatment well Patient left: in bed;with call bell/phone within reach;with bed alarm set (chair position; tilted bed slightly toward door to encourage pt looking R) Nurse Communication: Mobility status PT Visit Diagnosis: Other abnormalities of gait and mobility (R26.89);Muscle weakness (generalized) (M62.81);Hemiplegia and hemiparesis Hemiplegia - Right/Left: Right Hemiplegia - dominant/non-dominant: Dominant Hemiplegia - caused by: Nontraumatic intracerebral hemorrhage    Time: 1115-1147 PT Time Calculation (min) (ACUTE ONLY): 32 min   Charges:   PT Evaluation $PT Eval Moderate Complexity: 1 Mod   PT General Charges $$ ACUTE PT VISIT: 1 Visit         Anise Salvo, PT Acute Rehab [facility] [phone]   Rayetta Humphrey 04/26/2023, 12:39 PM

## 2023-04-26 NOTE — Progress Notes (Addendum)
STROKE TEAM PROGRESS NOTE   BRIEF HPI Mason [name] is a 77 y.o. male with a history of atrial fibrillation diagnosed about 4 years ago per patient who has been managed on Xarelto since that time.  He was in his normal state of health until 8 PM at which point he started noticing right-sided weakness.  Patient was not sure if he took his xarelto or not.    SIGNIFICANT HOSPITAL EVENTS 9/3: 3ml acute IPH, No I'VE 9/4: interval increase in hemorrhage size.    INTERIM HISTORY/SUBJECTIVE BP parameters lowered to <140 due to increased size of IPH. Cleviprex gtt as needed.  Repeat CT ordered for tonight. MRI tomorrow.  No family at bedside, RN at bedside.  Patient awake and alert, disoriented to age and time. Limited language. R facial droop.   OBJECTIVE  CBC    Component Value Date/Time   WBC 19.6 (H) 04/26/2023 0924   RBC 5.98 (H) 04/26/2023 0924   HGB 17.0 04/26/2023 0924   HCT 51.2 04/26/2023 0924   PLT 518 (H) 04/26/2023 0924   MCV 85.6 04/26/2023 0924   MCH 28.4 04/26/2023 0924   MCHC 33.2 04/26/2023 0924   RDW 15.3 04/26/2023 0924   LYMPHSABS 3.4 04/25/2023 2106   MONOABS 1.0 04/25/2023 2106   EOSABS 0.5 04/25/2023 2106   BASOSABS 0.2 (H) 04/25/2023 2106    BMET    Component Value Date/Time   NA 133 (L) 04/26/2023 0924   K 4.3 04/26/2023 0924   CL 100 04/26/2023 0924   CO2 26 04/26/2023 0924   GLUCOSE 110 (H) 04/26/2023 0924   BUN 16 04/26/2023 0924   CREATININE 0.92 04/26/2023 0924   CALCIUM 8.6 (L) 04/26/2023 0924   GFRNONAA >60 04/26/2023 0924    IMAGING past 24 hours CT ANGIO HEAD NECK W WO CM  Result Date: 04/26/2023 CLINICAL DATA:  Stroke, hemorrhagic. EXAM: CT ANGIOGRAPHY HEAD AND NECK WITH AND WITHOUT CONTRAST TECHNIQUE: Multidetector CT imaging of the head and neck was performed using the standard protocol during bolus administration of intravenous contrast. Multiplanar CT image reconstructions and MIPs were obtained to evaluate the vascular anatomy.  Carotid stenosis measurements (when applicable) are obtained utilizing NASCET criteria, using the distal internal carotid diameter as the denominator. RADIATION DOSE REDUCTION: This exam was performed according to the departmental dose-optimization program which includes automated exposure control, adjustment of the mA and/or kV according to patient size and/or use of iterative reconstruction technique. CONTRAST:  75mL OMNIPAQUE IOHEXOL 350 MG/ML SOLN COMPARISON:  CT head without contrast 04/25/2023 FINDINGS: CT HEAD FINDINGS Brain: The hemorrhage centered in the posterior limb left internal capsule has increased in size, now measuring 2.4 x 1.9 x 3.4 cm. Intraventricular hemorrhage is now present with layering blood in the posterior horns of both lateral ventricles. No hydrocephalus is present. Mild edema surrounds the hemorrhage. There is some mass effect on the left lateral ventricle. No significant midline shift is present. Periventricular white matter hypoattenuation is stable otherwise. The basal ganglia are otherwise unremarkable. No significant extraaxial fluid collection is present. The brainstem and cerebellum are within normal limits. Vascular: No hyperdense vessel or unexpected calcification. Skull: Calvarium is intact. No focal lytic or blastic lesions are present. No significant extracranial soft tissue lesion is present. Sinuses/Orbits: The paranasal sinuses and mastoid air cells are clear. Bilateral lens replacements are noted. Globes and orbits are otherwise unremarkable. Review of the MIP images confirms the above findings CTA NECK FINDINGS Aortic arch: A 3 vessel arch configuration is present.  A 6 mm ulceration is present along the medial inferior aspect of the aortic arch, incompletely imaged. The great vessel origins are within normal limits. Right carotid system: The right common carotid artery is within normal limits. Bifurcation is unremarkable. Cervical right ICA is normal. Left carotid  system: The left common carotid artery is within normal limits. Bifurcation is unremarkable. The cervical left ICA is normal. Vertebral arteries: The vertebral arteries are codominant. Both vertebral arteries originate from the subclavian arteries without significant stenosis. No significant stenosis is present in either vertebral artery in the neck. Skeleton: Mild degenerative changes are present in the cervical spine. No focal osseous lesions are present. Other neck: Soft tissues the neck are otherwise unremarkable. Salivary glands are within normal limits. Thyroid is normal. No significant adenopathy is present. No focal mucosal or submucosal lesions are present. Upper chest: The lung apices are clear. The thoracic inlet is within normal limits. Review of the MIP images confirms the above findings CTA HEAD FINDINGS Anterior circulation: Faint calcifications are present within the cavernous internal carotid arteries bilaterally without focal stenosis. ICA termini are normal bilaterally. The A1 and M1 segments are normal. The anterior communicating artery is patent. MCA bifurcations are within normal limits. The A1 and M1 segments are normal bilaterally. The ACA and MCA branch vessels are normal bilaterally. Posterior circulation: The PICA origins are visualized and normal bilaterally. The vertebrobasilar junction and basilar artery are normal. Both posterior cerebral scratched at the superior cerebellar arteries are normal bilaterally. Both posterior cerebral arteries originate from the basilar tip. A high-grade stenosis is present within the mid right P2 segment. The PCA branch vessels are normal bilaterally. No spot sign or vascular abnormalities associated with the hemorrhage. Venous sinuses: The dural sinuses are patent. The straight sinus and deep cerebral veins are intact. Cortical veins are within normal limits. No significant vascular malformation is evident. Anatomic variants: None Review of the MIP  images confirms the above findings IMPRESSION: 1. Interval increase in size of hemorrhage centered in the posterior limb left internal capsule, now measuring 2.4 x 1.9 x 3.4 cm. 2. Intraventricular hemorrhage is now present with layering blood in the posterior horns of both lateral ventricles. 3. No hydrocephalus. 4. High-grade stenosis of the mid right P2 segment. 5. No other significant proximal stenosis, aneurysm, or branch vessel occlusion within the Circle of Willis. 6. Normal CTA of the neck. 7. 6 mm ulceration along the medial inferior aspect of the aortic arch, incompletely imaged. Recommend CT angio chest for further evaluation of the aorta. These results were called by telephone at the time of interpretation on 04/26/2023 at 11:50 am to provider [facility] , who verbally acknowledged these results. Electronically Signed   By: Marin Roberts M.D.   On: 04/26/2023 11:52   CT HEAD CODE STROKE WO CONTRAST  Result Date: 04/25/2023 CLINICAL DATA:  Code stroke. Initial evaluation for neuro deficit, stroke, right-sided weakness. EXAM: CT HEAD WITHOUT CONTRAST TECHNIQUE: Contiguous axial images were obtained from the base of the skull through the vertex without intravenous contrast. RADIATION DOSE REDUCTION: This exam was performed according to the departmental dose-optimization program which includes automated exposure control, adjustment of the mA and/or kV according to patient size and/or use of iterative reconstruction technique. COMPARISON:  Prior study from 06/29/2018. FINDINGS: Brain: Mild age-related cerebral atrophy with chronic small vessel ischemic disease. Acute intraparenchymal hemorrhage centered at the left thalamic capsular region measures 2.6 x 1.6 x 1.3 cm (estimated volume 3 mL). Minor localized edema without  significant regional mass effect. No intraventricular extension. No other acute intracranial hemorrhage or large vessel territory infarct. No mass lesion or midline shift. No  hydrocephalus or extra-axial fluid collection. Vascular: No abnormal hyperdense vessel. Skull: Scalp soft tissues and calvarium demonstrate no acute finding. Sinuses/Orbits: Globes orbital soft tissues within normal limits. Mild scattered mucosal thickening noted about the ethmoidal air cells and maxillary sinuses. No mastoid effusion. Other: None. IMPRESSION: 1. 2.6 x 1.6 x 1.3 cm (estimated volume 3 mL) acute intraparenchymal hematoma at the left thalamic capsular region. No intraventricular extension or other complicating features. 2. Underlying age-related cerebral atrophy with chronic small vessel ischemic disease. These results were communicated to Dr. Amada Jupiter at 9:17 pm on 04/25/2023 by text page via the [facility] messaging system. Electronically Signed   By: Rise Mu M.D.   On: 04/25/2023 21:19    Vitals:   04/26/23 1145 04/26/23 1200 04/26/23 1215 04/26/23 1230  BP: (!) 145/97  (!) 146/92 130/85  Pulse:      Resp: 16  12 14   Temp:  97.7 F (36.5 C)    TempSrc:  Oral    SpO2:      Weight:        PHYSICAL EXAM General:  Alert, well-nourished, well-developed patient in no acute distress Psych:  Mood and affect appropriate for situation CV: Regular rate and rhythm on monitor Respiratory:  Regular, unlabored respirations on room air GI: Abdomen soft and nontender  NEURO:  Mental Status: Awake, alert, eyes spontaneously open, oriented to place. Speech/Language: s limited language output, dysarthric.  Poor attention.  Able to name 3 out of 4 objects.  Unable to repeat sentences.  Cranial Nerves:  II: PERRL. Visual fields full.  III, IV, VI: left gaze preference, but does cross midline and track.  Eyelids elevate symmetrically.  V: Sensation is intact to light touch and symmetrical to face.  VII: Right facial droop VIII: hearing intact to voice. IX, X: Palate elevates symmetrically. Phonation is normal.  AO:[id number] shrug decreased on right.  XII: tongue is midline   Motor: Right hemiplegia.  4+/5 LUE, 4/5 LLE. Tone: is normal and bulk is normal Sensation- Intact to light touch bilaterally. Extinction absent to light touch to DSS.   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 CT repeat Interval increase in size of hemorrhage, now measuring 2.4 x 1.9 x 3.4 cm. Intraventricular hemorrhage is now present with layering blood in the posterior horns of both lateral ventricles. No hydrocephalus CTA head and neck high-grade stenosis of the mid right P2 segment.  2D Echo EF 60-65% LDL pending HgbA1c 6.0 UDS neg VTE prophylaxis - SCDs Xarelto (rivaroxaban) daily prior to admission, now on No antithrombotic due to IPH Therapy recommendations:  CIR Disposition:  pending  Atrial fibrillation Home Meds: Xarelto Xarelto reversed with Andexxa  Continue telemetry monitoring HR controlled Hold anticoagulation due to IPH  Aortic atherosclerosis  CTA neck showed 6 mm ulceration along the medial inferior aspect of the aortic arch, incompletely imaged. Recommend CT angio chest for further evaluation of the aorta. CTA chest pending  Hypertension Home meds:  none UnStable BP goal: SBP < 140 due to increased IPH seen on repeat imaging.  Cleviprex gtt as needed Labetalol and Hydralazine IVP PRNs as well.  Hyperlipidemia Home meds:  none LDL 82, goal < 70 Consider starting statin at discharge  Dysphagia Patient has post-stroke dysphagia, SLP consulted On dys 2 and honey thick liquid Advance diet as tolerated Aspiration precuations  Other Stroke Risk Factors Family hx stroke (mother) Former cigarette and cigar smoker   Other active problems Leukocytosis, WBC 14.8->19.6, afebrile   Hospital day # 1   Pt seen by Neuro NP/APP  and later by MD. Note/plan to be edited by MD as needed.    Lynnae January, DNP, AGACNP-BC [facility] Please use AMION for contact information & EPIC for messaging.  ATTENDING NOTE: I reviewed above note and agree with the assessment and plan. Pt was seen and examined.   Speech therapist at the bedside. Pt awake, alert, eyes open, orientated to place, but not to age or time. Able to name 3/4 but did not repeat sentences with me. Very limited language output but following all simple commands. No gaze palsy, tracking bilaterally, no hemianipia but right visual simultagnosia, PERRL. Right facial droop. Tongue midline. RUE and RLE hemiplegia. LUE and LLE at least 4/5. Sensation seems diminished on the right, left FTN intact, gait not tested.   Repeat CT showed increased ICH and now with small amount of IVH. No hydro. Put on BP goal < 140 now, and repeat CT tonight. CTA neck also concerning aortic ulceration with dilated aortic arch, will check CTA chest tonight also. Also pt passed swallow but now on honey thick liquid. Elevated WBC but no fever. Close neuro monitoring.   For detailed assessment and plan, please refer to above/below as I have made changes wherever appropriate.   Marvel Plan, MD PhD Stroke Neurology 04/26/2023 7:02 PM  This patient is critically ill due to ICH with IVH, hypertensive emergency, afib now off AC and at significant risk of neurological worsening, death form hematoma expansion, hydrocephalus, seizure, stroke, heart failure. This patient's care requires constant monitoring of vital signs, hemodynamics, respiratory and cardiac monitoring, review of multiple databases, neurological assessment, discussion with family, other specialists and medical decision making of high complexity. I spent 40 minutes of neurocritical care time in the care of this patient.    To contact Stroke Continuity provider, please refer to [email or web address]. After hours, contact General  Neurology

## 2023-04-26 NOTE — Progress Notes (Signed)
Patient with incidental PEs as well as possible adherent thrombus in the aorta on CTA. He is not hypoxemic or SOB and given that he had enlargement oh his ICH on repeat scan this AM, do not think he is a candidate for any type of antithrombotic currently. Will need to continue to monitor, may need to consider repeat study to confirm vs placement of IVC filter.   Ritta Slot, MD [facility] [phone]  If 7pm- 7am, please page neurology on call as listed in AMION.

## 2023-04-26 NOTE — Evaluation (Signed)
Occupational Therapy Evaluation Patient Details Name: Mason [name] MRN: [id number] DOB: [birth year] Today's Date: 04/26/2023   History of Present Illness Pt is a 77 y.o. male who presented on 04/25/23 with right sided weakness and difficulty speaking. CT head with L subcortical hemorrhage in setting of Xarelto use.   Significant PMH includes diverticulitius, GERD, A-fib.   Clinical Impression   PTA patient reports independent and driving. Admitted for above and presents with problem list below, including impaired communication and cognition, R hemiparesis, R inattention, impaired balance and decreased activity tolerance. He requires max-total assist for ADLs, max-total assist +2 for bed mobility and sitting EOB. He tends to push with L UE towards R side.  He can follow some simple commands with increased time, but demonstrating difficulty following multiple step commands; preference for L gaze/head turn visually.  Further visual and cognitive assessment recommended. Based on performance today, believe pt will best benefit from continued OT services acutely and after dc at an inpatient setting with >3hrs/day to optimize independence, safety and return to PLOF with ADLs and mobility.      If plan is discharge home, recommend the following: Two people to help with walking and/or transfers;Two people to help with bathing/dressing/bathroom;Assistance with cooking/housework;Assistance with feeding;Direct supervision/assist for medications management;Direct supervision/assist for financial management;Assist for transportation;Help with stairs or ramp for entrance    Functional Status Assessment  Patient has had a recent decline in their functional status and demonstrates the ability to make significant improvements in function in a reasonable and predictable amount of time.  Equipment Recommendations  Other (comment) (defer)    Recommendations for Other Services Rehab consult     Precautions /  Restrictions Precautions Precautions: Fall Precaution Comments: R hemiparesis Restrictions Weight Bearing Restrictions: No      Mobility Bed Mobility Overal bed mobility: Needs Assistance Bed Mobility: Supine to Sit, Sit to Sidelying, Rolling Rolling: Min assist, Total assist   Supine to sit: Max assist, +2 for physical assistance, +2 for safety/equipment, HOB elevated, Used rails   Sit to sidelying: +2 for physical assistance, +2 for safety/equipment, Max assist General bed mobility comments: chair position in bed, transitioned to EOB with assist for R side and technique; once supine rolled to L with total assist but towards R with min assist after cueing for technique    Transfers Overall transfer level: Needs assistance   Transfers: Bed to chair/wheelchair/BSC            Lateral/Scoot Transfers: Total assist, +2 physical assistance, +2 safety/equipment General transfer comment: scooting towards HOB towards L with total assist +2      Balance Overall balance assessment: Needs assistance Sitting-balance support: No upper extremity supported, Feet supported, Single extremity supported Sitting balance-Leahy Scale: Poor Sitting balance - Comments: relies on external support, R lateral lean initally and then posterior. Tends to push with L hand towards R side. Postural control: Posterior lean, Right lateral lean                                 ADL either performed or assessed with clinical judgement   ADL Overall ADL's : Needs assistance/impaired Eating/Feeding: NPO   Grooming: Sitting;Wash/dry face;Moderate assistance Grooming Details (indicate cue type and reason): mod assist for balance when washing face with L hand.  Would need increased assist for bimanual tasks due to hemiparesis         Upper Body Dressing : Maximal assistance;Sitting  Lower Body Dressing: Total assistance;+2 for physical assistance;+2 for safety/equipment;Sitting/lateral  leans;Bed level     Toilet Transfer Details (indicate cue type and reason): deferred         Functional mobility during ADLs: Maximal assistance;Total assistance;+2 for safety/equipment;+2 for physical assistance       Vision Baseline Vision/History: 0 No visual deficits Patient Visual Report: No change from baseline Additional Comments: further assessment required, Pt with preference to L head turn/gaze and initally difficulty scanning past/holding vision towards R side.  As session progressed pt able to scan/track therapist, read clock correctly but continues to have preference to L side.  He does not blink to threat on either eye.     Perception Perception: Impaired Preception Impairment Details: Inattention/Neglect Perception-Other Comments: R sided inattention to enviornemnt and body   Praxis         Pertinent Vitals/Pain Pain Assessment Pain Assessment: No/denies pain     Extremity/Trunk Assessment Upper Extremity Assessment Upper Extremity Assessment: Defer to OT evaluation (R UE flaccid, did have flexor tone with a yawn, defer to OT further detail) RUE Deficits / Details: flaccid, flexor synergy pattern (associated reaction) seen when yawning.  Does not withdrawl to noxious stimuli, 1 finger width sublux noted RUE Sensation: decreased light touch;decreased proprioception RUE Coordination: decreased fine motor;decreased gross motor   Lower Extremity Assessment Lower Extremity Assessment: RLE deficits/detail;LLE deficits/detail RLE Deficits / Details: ROM WFL; MMT: 0/5; Did have extensor tone when returned to bed.  Sensation appears to be absent to light touch and painful stimuli (nail bed squeeze) LLE Deficits / Details: ROM WFL; MMT 5/5   Cervical / Trunk Assessment Cervical / Trunk Assessment: Other exceptions Cervical / Trunk Exceptions: Posture normal; poor trunk control/strength/balance   Communication Communication Communication: Difficulty communicating  thoughts/reduced clarity of speech;Difficulty following commands/understanding Following commands: Follows one step commands inconsistently;Follows one step commands with increased time;Follows multi-step commands inconsistently Cueing Techniques: Verbal cues;Gestural cues;Tactile cues;Visual cues   Cognition Arousal: Lethargic, Alert Behavior During Therapy: Flat affect Overall Cognitive Status: Impaired/Different from baseline Area of Impairment: Orientation, Attention, Memory, Following commands, Safety/judgement, Awareness, Problem solving                 Orientation Level: Disoriented to, Time Current Attention Level: Sustained Memory: Decreased recall of precautions, Decreased short-term memory Following Commands: Follows one step commands with increased time, Follows one step commands inconsistently Safety/Judgement: Decreased awareness of safety, Decreased awareness of deficits Awareness: Intellectual, Emergent Problem Solving: Slow processing, Decreased initiation, Difficulty sequencing, Requires verbal cues, Requires tactile cues General Comments: Pt lethargic upon entry, but awakens to therapist on L side with tactile cueing. Patient oriented to self, place but not time (reports april). He is able to follow 1 step commands, improves once sitting EOB and more alert. Fair awareness to obvious deficits, voicing inability to move R side and it would not be safe to go home today. Able to correctly read clock, difficult to assess cog at times due to aphasia (but fluctates)     General Comments  VSS on RA, BP post 147/97    Exercises     Shoulder Instructions      Home Living Family/patient expects to be discharged to:: Private residence Living Arrangements: Alone Available Help at Discharge: Family;Available PRN/intermittently (son) Type of Home: House Home Access: Stairs to enter [employer] of Steps: 3   Home Layout: One level     Bathroom Shower/Tub:  Walk-in shower         Home Equipment:  None   Additional Comments: Pt is questionable historian and no family present; per RN son is from [place] and is flying in today 9/4  Lives With: Alone    Prior Functioning/Environment Prior Level of Function : Independent/Modified Independent;Working/employed;Driving               ADLs Comments: retired but driving        OT Problem List: Decreased strength;Decreased range of motion;Decreased activity tolerance;Impaired balance (sitting and/or standing);Impaired vision/perception;Decreased coordination;Decreased cognition;Decreased safety awareness;Decreased knowledge of use of DME or AE;Decreased knowledge of precautions;Impaired sensation;Impaired tone;Impaired UE functional use      OT Treatment/Interventions: Self-care/ADL training;Neuromuscular education;Energy conservation;DME and/or AE instruction;Splinting;Therapeutic activities;Cognitive remediation/compensation;Visual/perceptual remediation/compensation;Patient/family education;Balance training    OT Goals(Current goals can be found in the care plan section) Acute Rehab OT Goals Patient Stated Goal: none stated OT Goal Formulation: Patient unable to participate in goal setting Time For Goal Achievement: 05/10/23 Potential to Achieve Goals: Good  OT Frequency: Min 1X/week    Co-evaluation PT/OT/SLP Co-Evaluation/Treatment: Yes Reason for Co-Treatment: For patient/therapist safety;To address functional/ADL transfers;Necessary to address cognition/behavior during functional activity;Complexity of the patient's impairments (multi-system involvement) PT goals addressed during session: Mobility/safety with mobility;Balance OT goals addressed during session: ADL's and self-care      AM-PAC OT "6 Clicks" Daily Activity     Outcome Measure Help from another person eating meals?: Total Help from another person taking care of personal grooming?: A Lot Help from another person  toileting, which includes using toliet, bedpan, or urinal?: Total Help from another person bathing (including washing, rinsing, drying)?: A Lot Help from another person to put on and taking off regular upper body clothing?: A Lot Help from another person to put on and taking off regular lower body clothing?: Total 6 Click Score: 9   End of Session Nurse Communication: Mobility status  Activity Tolerance: Patient tolerated treatment well Patient left: in bed;with call bell/phone within reach;with bed alarm set  OT Visit Diagnosis: Other abnormalities of gait and mobility (R26.89);Hemiplegia and hemiparesis;Cognitive communication deficit (R41.841) Hemiplegia - Right/Left: Right Hemiplegia - dominant/non-dominant: Dominant Hemiplegia - caused by: Nontraumatic SAH                Time: 2956-2130 OT Time Calculation (min): 31 min Charges:  OT General Charges $OT Visit: 1 Visit OT Evaluation $OT Eval Moderate Complexity: 1 Mod  Barry Brunner, OT Acute Rehabilitation Services Office [phone]   Chancy Milroy 04/26/2023, 12:29 PM

## 2023-04-26 NOTE — Progress Notes (Signed)
  Echocardiogram 2D Echocardiogram has been performed.  Maren Reamer 04/26/2023, 2:41 PM

## 2023-04-26 NOTE — Evaluation (Signed)
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   Back pain 04/16/2023   Balance problem 07/03/2022   Abnormal CBC 11/24/2021   Neck pain 03/31/2021   Atrial fibrillation (HCC) 09/29/2019   Diverticulitis 09/18/2019   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/18/2019   Degenerative disc disease, cervical 01/23/2019   Skin irritation 10/28/2018   Health care maintenance 06/25/2017   Low HDL (under 40) 05/24/2016   Advance care planning 05/02/2014   PSA elevation 05/02/2014   BPH (benign prostatic hyperplasia) 12/10/2013   Erectile dysfunction 08/26/2013   GERD (gastroesophageal reflux disease) 06/28/2013   Postnasal drip 03/26/2012   Medicare annual wellness visit, subsequent 11/04/2011   Depression 04/01/2011   ANXIETY 12/15/2007   PEPTIC STRICTURE 12/15/2007   IRRITABLE BOWEL SYNDROME 12/23/2006   Past Medical History:  Past Medical History:  Diagnosis Date   Actinic keratosis 04/02/2013   L sup scapula - bx proven   Actinic keratosis 04/02/2013   R med ant deltoid - bx proven   Allergy    Anal fissure    Anxiety    Basal cell carcinoma    back - treated in the past    Basal cell carcinoma 05/02/2009   left supratip, Mohs   Cancer (HCC)    basal cell removed x4   Cataract    bil eyes   Depression    Diverticulosis of colon    GERD (gastroesophageal reflux disease)    esophageal narrowing   Glaucoma    Hiatal hernia    IBS (irritable bowel syndrome)    Insomnia    Internal hemorrhoids    Past Surgical History:  Past Surgical History:  Procedure Laterality Date   ANAL SPHINCTEROTOMY  03/1995   fistulotomy   APPENDECTOMY     BASAL CELL CARCINOMA EXCISION     on back   BICEPT TENODESIS Right 09/27/2004    decompression   COLONOSCOPY  04/1999   internal hemmroids,12-11-08 colon tics and hems only    ESOPHAGOGASTRODUODENOSCOPY  04/1999   with esophagitis and stricture   MOHS SURGERY  09/02/2008   L supratip of nose with flap,basal cell cancer [facility]/MOHS   MOUTH SURGERY  01/2020   TONGUE SURGERY  2017   had small place removed, noncancerous   TONSILLECTOMY AND ADENOIDECTOMY     remote   UPPER GASTROINTESTINAL ENDOSCOPY     WRIST SURGERY Right    WRIST SURGERY     scar tissue removed   HPI:  Pt is a 77 y.o. male who presneted on 04/25/23 with right sided weakness and difficulty speaking. CT head revealed acute intraparenchymal hematoma at the left thalamic capsular region.   Significant PMH includes diverticulitius, GERD, A-fib.   Assessment / Plan / Recommendation Clinical Impression  Pt was seen by SLP for bedside swallow evaluation post stroke. Given right sided weakness and sensory impairment, pt is at high risk for silent aspiration. Delayed throat clearing, multiple swallows, and hard swallow observed after PO administration. No cough observed. When pt was asked if swallow felt different, he indicated that he was not sure. No c/o of globus sensation or pain/discomfort associated with swallowing, but this may be due to reduced sensation resulting from stroke. SLP reccommending a modified barium swallow study to R/O silent asipration or penetration and determine  least restrictive PO diet.     SLP Assessment  SLP Recommendation/Assessment: Patient needs continued Speech Lanaguage Pathology Services SLP Visit Diagnosis: Dysphagia, unspecified (R13.10)    Recommendations for follow up therapy are one component of a multi-disciplinary discharge planning process, led by the attending physician.  Recommendations may be updated based on patient status, additional functional criteria and insurance authorization.    Follow Up Recommendations  Acute inpatient rehab (3hours/day)    Assistance  Recommended at Discharge  Set up Supervision/Assistance  Functional Status Assessment Patient has had a recent decline in their functional status and demonstrates the ability to make significant improvements in function in a reasonable and predictable amount of time.  Frequency and Duration min 2x/week  2 weeks      SLP Evaluation Cognition  Overall Cognitive Status: Impaired/Different from baseline Arousal/Alertness: Awake/alert Orientation Level: Oriented to person;Oriented to place;Oriented to time (Correct month, incorrect year (14, 20)) Year: Other (Comment) (14, 20) Month: September Attention: Sustained Sustained Attention: Appears intact Awareness: Appears intact Executive Function: Self Correcting Self Correcting: Impaired Self Correcting Impairment: Verbal basic       Comprehension  Auditory Comprehension Overall Auditory Comprehension: Impaired Yes/No Questions: Not tested Commands: Within Functional Limits (one-step commands) Conversation: Simple Interfering Components: Motor planning EffectiveTechniques: Repetition Visual Recognition/Discrimination Discrimination: Within Function Limits Reading Comprehension Reading Status: Not tested    Expression Expression Primary Mode of Expression: Verbal Verbal Expression Overall Verbal Expression: Impaired Initiation: No impairment Automatic Speech: Name Level of Generative/Spontaneous Verbalization: Word;Phrase;Sentence Repetition: Impaired Level of Impairment: Sentence level Naming: Impairment Responsive: Not tested Confrontation: Impaired Verbal Errors: Phonemic paraphasias;Neologisms;Aware of errors Pragmatics: No impairment Non-Verbal Means of Communication: Not applicable Written Expression Written Expression: Not tested   Oral / Motor  Oral Motor/Sensory Function Overall Oral Motor/Sensory Function: Moderate impairment Facial ROM: Reduced right Facial Symmetry: Abnormal symmetry right Facial Strength:  Reduced right Facial Sensation: Reduced right Lingual ROM: Reduced right Lingual Symmetry: Abnormal symmetry right Motor Speech Overall Motor Speech: Impaired Respiration: Within functional limits Phonation: Normal Resonance: Within functional limits Articulation: Impaired Level of Impairment: Conversation Intelligibility: Intelligibility reduced Word: 50-74% accurate Phrase: 25-49% accurate Sentence: 0-24% accurate Conversation: 0-24% accurate Motor Planning: Impaired Level of Impairment: Word Motor Speech Errors: Aware;Inconsistent Effective Techniques: Slow rate;Over-articulate            Marline Backbone, B.S., Speech Therapy Student

## 2023-04-26 NOTE — Evaluation (Signed)
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   Back pain 04/16/2023   Balance problem 07/03/2022   Abnormal CBC 11/24/2021   Neck pain 03/31/2021   Atrial fibrillation (HCC) 09/29/2019   Diverticulitis 09/18/2019   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/18/2019   Degenerative disc disease, cervical 01/23/2019   Skin irritation 10/28/2018   Health care maintenance 06/25/2017   Low HDL (under 40) 05/24/2016   Advance care planning 05/02/2014   PSA elevation 05/02/2014   BPH (benign prostatic hyperplasia) 12/10/2013   Erectile dysfunction 08/26/2013   GERD (gastroesophageal reflux disease) 06/28/2013   Postnasal drip 03/26/2012   Medicare annual wellness visit, subsequent 11/04/2011   Depression 04/01/2011   ANXIETY 12/15/2007   PEPTIC STRICTURE 12/15/2007   IRRITABLE BOWEL SYNDROME 12/23/2006   Past Medical History:  Past Medical History:  Diagnosis Date   Actinic keratosis 04/02/2013   L sup scapula - bx proven   Actinic keratosis 04/02/2013   R med ant deltoid - bx proven   Allergy    Anal fissure    Anxiety    Basal cell carcinoma    back - treated in the past    Basal cell carcinoma 05/02/2009   left supratip, Mohs   Cancer (HCC)    basal cell removed x4   Cataract    bil eyes   Depression    Diverticulosis of colon    GERD (gastroesophageal reflux disease)    esophageal narrowing   Glaucoma    Hiatal hernia    IBS (irritable bowel syndrome)    Insomnia    Internal hemorrhoids    Past Surgical History:  Past Surgical History:  Procedure Laterality Date   ANAL SPHINCTEROTOMY  03/1995   fistulotomy   APPENDECTOMY     BASAL CELL CARCINOMA EXCISION     on back   BICEPT TENODESIS Right 09/27/2004    decompression   COLONOSCOPY  04/1999   internal hemmroids,12-11-08 colon tics and hems only    ESOPHAGOGASTRODUODENOSCOPY  04/1999   with esophagitis and stricture   MOHS SURGERY  09/02/2008   L supratip of nose with flap,basal cell cancer [facility]/MOHS   MOUTH SURGERY  01/2020   TONGUE SURGERY  2017   had small place removed, noncancerous   TONSILLECTOMY AND ADENOIDECTOMY     remote   UPPER GASTROINTESTINAL ENDOSCOPY     WRIST SURGERY Right    WRIST SURGERY     scar tissue removed   HPI:  Pt is a 77 y.o. male who presneted on 04/25/23 with right sided weakness and difficulty speaking. CT head revealed acute intraparenchymal hematoma at the left thalamic capsular region.   Significant PMH includes diverticulitius, GERD, A-fib.   Assessment / Plan / Recommendation Clinical Impression  Pt was seen by SLP post stroke for speech language evaluation. Pt presents with an expressive aphasia and flaccid dysarthria as evidenced by impairments observed during evaluation. Right sided neglect/inattention along with hemiplegia was observed. Pt was orientated to self and place, partially orientated to time (month correct year incorrect). Presence of aphasia often confounded ability to determine accuracy of answers. Pt appeared aware of current deficits, but did not try to self correct beyond slowing down rate of speech. Single to two word repetition intact, more complex repetitions were impaired. Word  level reading intact, but phrase and sentence level was impaired. Receptively, the pt identified objects and followed one-step commands. It is likely that more complex directions would not be followed accurately. Speech was primarily characterized by neologisms and frequent phonemic paraphasias. Presence of flaccid dysarthria evidenced by poor motor planning in complex repetition and conversation. Pt would benefit from skilled speech intervention focused on speech intelligibility and language.     SLP Assessment   SLP Recommendation/Assessment: Patient needs continued Speech Lanaguage Pathology Services SLP Visit Diagnosis: Aphasia (R47.01);Dysarthria and anarthria (R47.1)    Recommendations for follow up therapy are one component of a multi-disciplinary discharge planning process, led by the attending physician.  Recommendations may be updated based on patient status, additional functional criteria and insurance authorization.    Follow Up Recommendations  Acute inpatient rehab (3hours/day)    Assistance Recommended at Discharge  Set up Supervision/Assistance  Functional Status Assessment Patient has had a recent decline in their functional status and demonstrates the ability to make significant improvements in function in a reasonable and predictable amount of time.  Frequency and Duration min 2x/week  2 weeks      SLP Evaluation Cognition  Overall Cognitive Status: Impaired/Different from baseline Arousal/Alertness: Awake/alert Orientation Level: Oriented to person;Oriented to place;Oriented to time (Correct month, incorrect year (14, 20)) Year: Other (Comment) (14, 20) Month: September Attention: Sustained Sustained Attention: Appears intact Awareness: Appears intact Executive Function: Self Correcting Self Correcting: Impaired Self Correcting Impairment: Verbal basic       Comprehension  Auditory Comprehension Overall Auditory Comprehension: Impaired Yes/No Questions: Not tested Commands: Within Functional Limits (one-step commands) Conversation: Simple Interfering Components: Motor planning EffectiveTechniques: Repetition Visual Recognition/Discrimination Discrimination: Within Function Limits Reading Comprehension Reading Status: Not tested    Expression Expression Primary Mode of Expression: Verbal Verbal Expression Overall Verbal Expression: Impaired Initiation: No impairment Automatic Speech: Name Level of Generative/Spontaneous Verbalization:  Word;Phrase;Sentence Repetition: Impaired Level of Impairment: Sentence level Naming: Impairment Responsive: Not tested Confrontation: Impaired Verbal Errors: Phonemic paraphasias;Neologisms;Aware of errors Pragmatics: No impairment Non-Verbal Means of Communication: Not applicable Written Expression Written Expression: Not tested   Oral / Motor  Oral Motor/Sensory Function Overall Oral Motor/Sensory Function: Moderate impairment Facial ROM: Reduced right Facial Symmetry: Abnormal symmetry right Facial Strength: Reduced right Facial Sensation: Reduced right Lingual ROM: Reduced right Lingual Symmetry: Abnormal symmetry right Motor Speech Overall Motor Speech: Impaired Respiration: Within functional limits Phonation: Normal Resonance: Within functional limits Articulation: Impaired Level of Impairment: Conversation Intelligibility: Intelligibility reduced Word: 50-74% accurate Phrase: 25-49% accurate Sentence: 0-24% accurate Conversation: 0-24% accurate Motor Planning: Impaired Level of Impairment: Word Motor Speech Errors: Aware;Inconsistent Effective Techniques: Slow rate;Over-articulate            Marline Backbone, B.S., Speech Therapy Student

## 2023-04-27 ENCOUNTER — Inpatient Hospital Stay (HOSPITAL_COMMUNITY): Payer: Medicare Other

## 2023-04-27 DIAGNOSIS — I6300 Cerebral infarction due to thrombosis of unspecified precerebral artery: Secondary | ICD-10-CM | POA: Diagnosis not present

## 2023-04-27 DIAGNOSIS — Z86711 Personal history of pulmonary embolism: Secondary | ICD-10-CM

## 2023-04-27 LAB — CBC
HCT: 54 % — ABNORMAL HIGH (ref 39.0–52.0)
Hemoglobin: 18.6 g/dL — ABNORMAL HIGH (ref 13.0–17.0)
MCH: 29 pg (ref 26.0–34.0)
MCHC: 34.4 g/dL (ref 30.0–36.0)
MCV: 84.1 fL (ref 80.0–100.0)
Platelets: 490 10*3/uL — ABNORMAL HIGH (ref 150–400)
RBC: 6.42 MIL/uL — ABNORMAL HIGH (ref 4.22–5.81)
RDW: 16.7 % — ABNORMAL HIGH (ref 11.5–15.5)
WBC: 23.4 10*3/uL — ABNORMAL HIGH (ref 4.0–10.5)
nRBC: 0 % (ref 0.0–0.2)

## 2023-04-27 LAB — BASIC METABOLIC PANEL
Anion gap: 11 (ref 5–15)
BUN: 17 mg/dL (ref 8–23)
CO2: 21 mmol/L — ABNORMAL LOW (ref 22–32)
Calcium: 8.7 mg/dL — ABNORMAL LOW (ref 8.9–10.3)
Chloride: 100 mmol/L (ref 98–111)
Creatinine, Ser: 1.05 mg/dL (ref 0.61–1.24)
GFR, Estimated: >60 mL/min (ref >60)
Glucose, Bld: 133 mg/dL — ABNORMAL HIGH (ref 70–99)
Potassium: 4.3 mmol/L (ref 3.5–5.1)
Sodium: 132 mmol/L — ABNORMAL LOW (ref 135–145)

## 2023-04-27 LAB — LIPID PANEL
Cholesterol: 125 mg/dL (ref 0–200)
HDL: 34 mg/dL — ABNORMAL LOW (ref >40)
LDL Cholesterol: 59 mg/dL (ref 0–99)
Total CHOL/HDL Ratio: 3.7 ratio
Triglycerides: 161 mg/dL — ABNORMAL HIGH (ref <150)
VLDL: 32 mg/dL (ref 0–40)

## 2023-04-27 MED ORDER — AMLODIPINE BESYLATE 10 MG PO TABS
10.0000 mg | ORAL_TABLET | Freq: Every day | ORAL | Status: DC
  Administered 2023-04-27 – 2023-05-05 (×9): 10 mg via ORAL
  Filled 2023-04-27 (×9): qty 1

## 2023-04-27 MED ORDER — PANTOPRAZOLE SODIUM 40 MG PO TBEC
40.0000 mg | DELAYED_RELEASE_TABLET | Freq: Every day | ORAL | Status: DC
  Administered 2023-04-27 – 2023-05-04 (×8): 40 mg via ORAL
  Filled 2023-04-27 (×8): qty 1

## 2023-04-27 MED ORDER — HEPARIN SODIUM (PORCINE) 5000 UNIT/ML IJ SOLN
5000.0000 [IU] | Freq: Three times a day (TID) | INTRAMUSCULAR | Status: DC
  Administered 2023-04-27 – 2023-05-05 (×25): 5000 [IU] via SUBCUTANEOUS
  Filled 2023-04-27 (×24): qty 1

## 2023-04-27 NOTE — Progress Notes (Signed)
Physical Therapy Treatment Patient Details Name: Mason [name] MRN: [id number] DOB: 09-11-1945 Today's Date: 04/27/2023   History of Present Illness Pt is a 77 y.o. male who presented on 04/25/23 with right sided weakness and difficulty speaking. CT head with L subcortical hemorrhage in setting of Xarelto use.   Significant PMH includes diverticulitius, GERD, A-fib.    PT Comments  Patient resting in bed, denies pain and agreeable to therapy. Max Assist to roll and reach for bed rail with Lt UE, max+2 to bring LE's off edge and raise trunk upright. Pt required max assist initially to stabilize balance EOB, fading to min assist with intermittent moments of CGA during static seated balance. Pt required manual assist to position Rt UE for seated stability and constant cues to attend to Rt side; pt unable to kick Rt LE or attempt movement with Rt UE. Pt followed cues for Rt visual tracking and to turn head to scan Rt, during seated balance activities with multidirectional reaching. Pt performed lateral weight shifting at EOB with mod-max assist to return to midline. Max+2 to return to supine and reposition in bed, pt's head propped with towel roll to facilitate midline c-spine alignment and Rt rotation. Will progress pt as able, continue to recommend intense rehab follow up >3 hours/day.    If plan is discharge home, recommend the following: Two people to help with walking and/or transfers;Two people to help with bathing/dressing/bathroom   Can travel by private vehicle        Equipment Recommendations  Other (comment)    Recommendations for Other Services Rehab consult     Precautions / Restrictions Precautions Precautions: Fall Precaution Comments: R hemiparesis Restrictions Weight Bearing Restrictions: No     Mobility  Bed Mobility Overal bed mobility: Needs Assistance Bed Mobility: Supine to Sit, Sit to Supine Rolling: Used rails, Max assist, +2 for safety/equipment   Supine to  sit: Max assist, +2 for physical assistance, +2 for safety/equipment, HOB elevated, Used rails Sit to supine: +2 for physical assistance, +2 for safety/equipment, Max assist, Total assist        Transfers                        Ambulation/Gait                   Stairs             Wheelchair Mobility     Tilt Bed    Modified Rankin (Stroke Patients Only)       Balance Overall balance assessment: Needs assistance Sitting-balance support: No upper extremity supported, Feet supported, Single extremity supported Sitting balance-Leahy Scale: Poor Sitting balance - Comments: relies on external support, R lateral lean initally and then posterior. Tends to push with L hand towards R side. Postural control: Posterior lean, Right lateral lean                                  Cognition Arousal: Lethargic, Alert Behavior During Therapy: Flat affect Overall Cognitive Status: Impaired/Different from baseline Area of Impairment: Orientation, Attention, Memory, Following commands, Safety/judgement, Awareness, Problem solving                 Orientation Level: Disoriented to, Time Current Attention Level: Sustained Memory: Decreased recall of precautions, Decreased short-term memory Following Commands: Follows one step commands with increased time, Follows one step commands inconsistently Safety/Judgement: Decreased  awareness of safety, Decreased awareness of deficits Awareness: Intellectual, Emergent Problem Solving: Slow processing, Decreased initiation, Difficulty sequencing, Requires verbal cues, Requires tactile cues          Exercises Other Exercises Other Exercises: Seated reaching with Lt UE: 3x 2 minutes of forward reaching in ipsilateral and contralateral direction to retrieve ball and hand off ball to rehab tech in front of pt. Pt reaching superiorly to facilitate upright posture and reaching down to facilitate anterior weight  shift, cues to extend back/posteriorly to return to midline posture. Min-mod assist to regain balance/midline. Other Exercises: Lt forearms prop 8x on Lt UE with mod assist to return to midline    General Comments        Pertinent Vitals/Pain Pain Assessment Pain Assessment: No/denies pain    Home Living                          Prior Function            PT Goals (current goals can now be found in the care plan section) Acute Rehab PT Goals Patient Stated Goal: unable to state PT Goal Formulation: With patient Time For Goal Achievement: 05/10/23 Potential to Achieve Goals: Good Progress towards PT goals: Progressing toward goals    Frequency    Min 1X/week      PT Plan      Co-evaluation              AM-PAC PT "6 Clicks" Mobility   Outcome Measure  Help needed turning from your back to your side while in a flat bed without using bedrails?: A Lot Help needed moving from lying on your back to sitting on the side of a flat bed without using bedrails?: Total Help needed moving to and from a bed to a chair (including a wheelchair)?: Total Help needed standing up from a chair using your arms (e.g., wheelchair or bedside chair)?: Total Help needed to walk in hospital room?: Total Help needed climbing 3-5 steps with a railing? : Total 6 Click Score: 7    End of Session Equipment Utilized During Treatment: Gait belt Activity Tolerance: Patient tolerated treatment well Patient left: in bed;with call bell/phone within reach;with bed alarm set Nurse Communication: Mobility status PT Visit Diagnosis: Other abnormalities of gait and mobility (R26.89);Muscle weakness (generalized) (M62.81);Hemiplegia and hemiparesis Hemiplegia - Right/Left: Right Hemiplegia - dominant/non-dominant: Dominant Hemiplegia - caused by: Nontraumatic intracerebral hemorrhage     Time: 1515-1540 PT Time Calculation (min) (ACUTE ONLY): 25 min  Charges:    $Therapeutic  Activity: 23-37 mins PT General Charges $$ ACUTE PT VISIT: 1 Visit                      Wynn Maudlin, DPT Acute Rehabilitation Services Office [phone]  04/27/23 4:59 PM

## 2023-04-27 NOTE — TOC Initial Note (Signed)
Transition of Care [facility]) - Initial/Assessment Note    Patient Details  Name: Mason [name] MRN: [id number] Date of Birth: Sep 06, 1945  Transition of Care [facility]) CM/SW Contact:    Mearl Latin, LCSW Phone Number: 04/27/2023, 10:05 AM  Clinical Narrative:                 Patient admitted from home with stoke. CSW following for rehab needs and ability to tolerate CIR.     Barriers to Discharge: Continued Medical Work up   Patient Goals and CMS Choice            Expected Discharge Plan and Services In-house Referral: Clinical Social Work     Living arrangements for the past 2 months: Single Family Home                                      Prior Living Arrangements/Services Living arrangements for the past 2 months: Single Family Home   Patient language and need for interpreter reviewed:: Yes        Need for Family Participation in Patient Care: Yes (Comment) Care giver support system in place?: Yes (comment)   Criminal Activity/Legal Involvement Pertinent to Current Situation/Hospitalization: No - Comment as needed  Activities of Daily Living      Permission Sought/Granted                  Emotional Assessment Appearance:: Appears stated age     Orientation: : Oriented to Self, Oriented to Place Alcohol / Substance Use: Not Applicable Psych Involvement: No (comment)  Admission diagnosis:  Stroke (cerebrum) (HCC) [I63.9] Intraparenchymal hemorrhage of brain [facility]) [I61.9] Patient Active Problem List   Diagnosis Date Noted   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   Back pain 04/16/2023   Balance problem 07/03/2022   Abnormal CBC 11/24/2021   Neck pain 03/31/2021   Atrial fibrillation (HCC) 09/29/2019   Diverticulitis 09/18/2019   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/18/2019   Degenerative disc disease, cervical 01/23/2019   Skin irritation 10/28/2018   Health care maintenance 06/25/2017   Low HDL (under 40) 05/24/2016   Advance care planning  05/02/2014   PSA elevation 05/02/2014   BPH (benign prostatic hyperplasia) 12/10/2013   Erectile dysfunction 08/26/2013   GERD (gastroesophageal reflux disease) 06/28/2013   Postnasal drip 03/26/2012   Medicare annual wellness visit, subsequent 11/04/2011   Depression 04/01/2011   ANXIETY 12/15/2007   PEPTIC STRICTURE 12/15/2007   IRRITABLE BOWEL SYNDROME 12/23/2006   PCP:  Joaquim Nam, MD Pharmacy:   [facility] [phone] [place], [place] - [address] [place] [address] Phone: [phone] Fax: [phone]  [facility] - [place], [place] - [id number], [address] [place] [address] Phone: [phone] Fax: [phone]  [facility] - [place], [place] - [address] [facility] AT Portal to Registered [facility] [address] [place] [address] Phone: [phone] Fax: [phone]  [facility] [address], [place] - [address] [place] [address] Phone: [phone] Fax: [phone]     Social Determinants of Health (SDOH) Social History: SDOH Screenings   Food Insecurity: No Food Insecurity (07/20/2022)  Housing: Low Risk  (07/20/2022)  Transportation Needs: No Transportation Needs (07/20/2022)  Utilities: Not At Risk (07/20/2022)  Alcohol Screen: Low Risk  (07/08/2021)  Depression (PHQ2-9): Low Risk  (  04/14/2023)  Financial Resource Strain: Low Risk  (07/20/2022)  Physical Activity: Insufficiently Active (07/20/2022)  Social Connections: Socially Isolated (07/20/2022)  Stress: No Stress Concern Present (07/20/2022)  Tobacco Use: Medium Risk (04/14/2023)   SDOH Interventions:     Readmission Risk Interventions     No data to display

## 2023-04-27 NOTE — Progress Notes (Addendum)
atherosclerotic plaque or mural thrombus. This has increased from CT 09/20/2019. Mediastinum/Nodes: Debris within the right posterior trachea. Unremarkable esophagus. No thoracic adenopathy. Lungs/Pleura: Lungs are clear. Mild diffuse bronchial wall thickening. No pleural effusion or pneumothorax. Upper Abdomen: No acute abnormality. Vicarious excretion of contrast in the bladder. Musculoskeletal: No acute fracture. Review of the MIP images confirms the above findings. IMPRESSION: 1. Nonocclusive filling defects in two segmental right upper lobe pulmonary arteries suspicious for acute pulmonary embolism. There is some motion in this area and this could be artifactual. Consider lower extremity venous ultrasound to look for possible source. 2. There is a 6 mm oval nodule along the medial wall of the aortic arch. Differential considerations include a mural thrombus or small intimal flap. 3. 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 This has increased from CT 09/20/2019. 4. Debris within the trachea. Critical Value/emergent results were called by telephone at the time of interpretation on 04/26/2023 at 11:48 pm to provider Dr. Amada Jupiter, who verbally acknowledged these results. Electronically Signed   By: Minerva Fester M.D.   On: 04/27/2023 00:00   ECHOCARDIOGRAM COMPLETE  Result Date: 04/26/2023    ECHOCARDIOGRAM REPORT   Patient Name:   Mason [name] [facility] Date of Exam: 04/26/2023 Medical Rec #:  [id number]         Height:       69.5 in Accession #:    [id number]        Weight:       180.3 lb Date of Birth:  1946/07/04        BSA:          1.987 m Patient Age:    77 years          BP:           128/81 mmHg Patient Gender: M                 HR:           69 bpm. Exam Location:  Inpatient Procedure: 2D Echo, Cardiac  Doppler and Color Doppler Indications:    Stroke I63.9  History:        Patient has prior history of Echocardiogram examinations, most                 recent 10/14/2022. Cancer, Signs/Symptoms:Altered Mental Status;                 Risk Factors:Former Smoker.  Sonographer:    Aron Baba Referring Phys: [phone] MCNEILL P KIRKPATRICK  Sonographer Comments: Suboptimal subcostal window. Image acquisition challenging due to respiratory motion. IMPRESSIONS  1. Left ventricular ejection fraction, by estimation, is 60 to 65%. The left ventricle has normal function. The left ventricle has no regional wall motion abnormalities. There is mild left ventricular hypertrophy. Left ventricular diastolic parameters are consistent with Grade I diastolic dysfunction (impaired relaxation).  2. Right ventricular systolic function is normal. The right ventricular size is normal. There is normal pulmonary artery systolic pressure.  3. The mitral valve is grossly normal. No evidence of mitral valve regurgitation.  4. Aortic valve regurgitation is not visualized.  5. The inferior vena cava is normal in size with greater than 50% respiratory variability, suggesting right atrial pressure of 3 mmHg. Conclusion(s)/Recommendation(s): No intracardiac source of embolism detected on this transthoracic study. Consider a transesophageal echocardiogram to exclude cardiac source of embolism if clinically indicated. Compared to prior study less MR. FINDINGS  atherosclerotic plaque or mural thrombus. This has increased from CT 09/20/2019. Mediastinum/Nodes: Debris within the right posterior trachea. Unremarkable esophagus. No thoracic adenopathy. Lungs/Pleura: Lungs are clear. Mild diffuse bronchial wall thickening. No pleural effusion or pneumothorax. Upper Abdomen: No acute abnormality. Vicarious excretion of contrast in the bladder. Musculoskeletal: No acute fracture. Review of the MIP images confirms the above findings. IMPRESSION: 1. Nonocclusive filling defects in two segmental right upper lobe pulmonary arteries suspicious for acute pulmonary embolism. There is some motion in this area and this could be artifactual. Consider lower extremity venous ultrasound to look for possible source. 2. There is a 6 mm oval nodule along the medial wall of the aortic arch. Differential considerations include a mural thrombus or small intimal flap. 3. 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 This has increased from CT 09/20/2019. 4. Debris within the trachea. Critical Value/emergent results were called by telephone at the time of interpretation on 04/26/2023 at 11:48 pm to provider Dr. Amada Jupiter, who verbally acknowledged these results. Electronically Signed   By: Minerva Fester M.D.   On: 04/27/2023 00:00   ECHOCARDIOGRAM COMPLETE  Result Date: 04/26/2023    ECHOCARDIOGRAM REPORT   Patient Name:   Mason [name] [facility] Date of Exam: 04/26/2023 Medical Rec #:  [id number]         Height:       69.5 in Accession #:    [id number]        Weight:       180.3 lb Date of Birth:  1946/07/04        BSA:          1.987 m Patient Age:    77 years          BP:           128/81 mmHg Patient Gender: M                 HR:           69 bpm. Exam Location:  Inpatient Procedure: 2D Echo, Cardiac  Doppler and Color Doppler Indications:    Stroke I63.9  History:        Patient has prior history of Echocardiogram examinations, most                 recent 10/14/2022. Cancer, Signs/Symptoms:Altered Mental Status;                 Risk Factors:Former Smoker.  Sonographer:    Aron Baba Referring Phys: [phone] MCNEILL P KIRKPATRICK  Sonographer Comments: Suboptimal subcostal window. Image acquisition challenging due to respiratory motion. IMPRESSIONS  1. Left ventricular ejection fraction, by estimation, is 60 to 65%. The left ventricle has normal function. The left ventricle has no regional wall motion abnormalities. There is mild left ventricular hypertrophy. Left ventricular diastolic parameters are consistent with Grade I diastolic dysfunction (impaired relaxation).  2. Right ventricular systolic function is normal. The right ventricular size is normal. There is normal pulmonary artery systolic pressure.  3. The mitral valve is grossly normal. No evidence of mitral valve regurgitation.  4. Aortic valve regurgitation is not visualized.  5. The inferior vena cava is normal in size with greater than 50% respiratory variability, suggesting right atrial pressure of 3 mmHg. Conclusion(s)/Recommendation(s): No intracardiac source of embolism detected on this transthoracic study. Consider a transesophageal echocardiogram to exclude cardiac source of embolism if clinically indicated. Compared to prior study less MR. FINDINGS  atherosclerotic plaque or mural thrombus. This has increased from CT 09/20/2019. Mediastinum/Nodes: Debris within the right posterior trachea. Unremarkable esophagus. No thoracic adenopathy. Lungs/Pleura: Lungs are clear. Mild diffuse bronchial wall thickening. No pleural effusion or pneumothorax. Upper Abdomen: No acute abnormality. Vicarious excretion of contrast in the bladder. Musculoskeletal: No acute fracture. Review of the MIP images confirms the above findings. IMPRESSION: 1. Nonocclusive filling defects in two segmental right upper lobe pulmonary arteries suspicious for acute pulmonary embolism. There is some motion in this area and this could be artifactual. Consider lower extremity venous ultrasound to look for possible source. 2. There is a 6 mm oval nodule along the medial wall of the aortic arch. Differential considerations include a mural thrombus or small intimal flap. 3. 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 This has increased from CT 09/20/2019. 4. Debris within the trachea. Critical Value/emergent results were called by telephone at the time of interpretation on 04/26/2023 at 11:48 pm to provider Dr. Amada Jupiter, who verbally acknowledged these results. Electronically Signed   By: Minerva Fester M.D.   On: 04/27/2023 00:00   ECHOCARDIOGRAM COMPLETE  Result Date: 04/26/2023    ECHOCARDIOGRAM REPORT   Patient Name:   Mason [name] [facility] Date of Exam: 04/26/2023 Medical Rec #:  [id number]         Height:       69.5 in Accession #:    [id number]        Weight:       180.3 lb Date of Birth:  1946/07/04        BSA:          1.987 m Patient Age:    77 years          BP:           128/81 mmHg Patient Gender: M                 HR:           69 bpm. Exam Location:  Inpatient Procedure: 2D Echo, Cardiac  Doppler and Color Doppler Indications:    Stroke I63.9  History:        Patient has prior history of Echocardiogram examinations, most                 recent 10/14/2022. Cancer, Signs/Symptoms:Altered Mental Status;                 Risk Factors:Former Smoker.  Sonographer:    Aron Baba Referring Phys: [phone] MCNEILL P KIRKPATRICK  Sonographer Comments: Suboptimal subcostal window. Image acquisition challenging due to respiratory motion. IMPRESSIONS  1. Left ventricular ejection fraction, by estimation, is 60 to 65%. The left ventricle has normal function. The left ventricle has no regional wall motion abnormalities. There is mild left ventricular hypertrophy. Left ventricular diastolic parameters are consistent with Grade I diastolic dysfunction (impaired relaxation).  2. Right ventricular systolic function is normal. The right ventricular size is normal. There is normal pulmonary artery systolic pressure.  3. The mitral valve is grossly normal. No evidence of mitral valve regurgitation.  4. Aortic valve regurgitation is not visualized.  5. The inferior vena cava is normal in size with greater than 50% respiratory variability, suggesting right atrial pressure of 3 mmHg. Conclusion(s)/Recommendation(s): No intracardiac source of embolism detected on this transthoracic study. Consider a transesophageal echocardiogram to exclude cardiac source of embolism if clinically indicated. Compared to prior study less MR. FINDINGS  atherosclerotic plaque or mural thrombus. This has increased from CT 09/20/2019. Mediastinum/Nodes: Debris within the right posterior trachea. Unremarkable esophagus. No thoracic adenopathy. Lungs/Pleura: Lungs are clear. Mild diffuse bronchial wall thickening. No pleural effusion or pneumothorax. Upper Abdomen: No acute abnormality. Vicarious excretion of contrast in the bladder. Musculoskeletal: No acute fracture. Review of the MIP images confirms the above findings. IMPRESSION: 1. Nonocclusive filling defects in two segmental right upper lobe pulmonary arteries suspicious for acute pulmonary embolism. There is some motion in this area and this could be artifactual. Consider lower extremity venous ultrasound to look for possible source. 2. There is a 6 mm oval nodule along the medial wall of the aortic arch. Differential considerations include a mural thrombus or small intimal flap. 3. 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 This has increased from CT 09/20/2019. 4. Debris within the trachea. Critical Value/emergent results were called by telephone at the time of interpretation on 04/26/2023 at 11:48 pm to provider Dr. Amada Jupiter, who verbally acknowledged these results. Electronically Signed   By: Minerva Fester M.D.   On: 04/27/2023 00:00   ECHOCARDIOGRAM COMPLETE  Result Date: 04/26/2023    ECHOCARDIOGRAM REPORT   Patient Name:   Mason [name] [facility] Date of Exam: 04/26/2023 Medical Rec #:  [id number]         Height:       69.5 in Accession #:    [id number]        Weight:       180.3 lb Date of Birth:  1946/07/04        BSA:          1.987 m Patient Age:    77 years          BP:           128/81 mmHg Patient Gender: M                 HR:           69 bpm. Exam Location:  Inpatient Procedure: 2D Echo, Cardiac  Doppler and Color Doppler Indications:    Stroke I63.9  History:        Patient has prior history of Echocardiogram examinations, most                 recent 10/14/2022. Cancer, Signs/Symptoms:Altered Mental Status;                 Risk Factors:Former Smoker.  Sonographer:    Aron Baba Referring Phys: [phone] MCNEILL P KIRKPATRICK  Sonographer Comments: Suboptimal subcostal window. Image acquisition challenging due to respiratory motion. IMPRESSIONS  1. Left ventricular ejection fraction, by estimation, is 60 to 65%. The left ventricle has normal function. The left ventricle has no regional wall motion abnormalities. There is mild left ventricular hypertrophy. Left ventricular diastolic parameters are consistent with Grade I diastolic dysfunction (impaired relaxation).  2. Right ventricular systolic function is normal. The right ventricular size is normal. There is normal pulmonary artery systolic pressure.  3. The mitral valve is grossly normal. No evidence of mitral valve regurgitation.  4. Aortic valve regurgitation is not visualized.  5. The inferior vena cava is normal in size with greater than 50% respiratory variability, suggesting right atrial pressure of 3 mmHg. Conclusion(s)/Recommendation(s): No intracardiac source of embolism detected on this transthoracic study. Consider a transesophageal echocardiogram to exclude cardiac source of embolism if clinically indicated. Compared to prior study less MR. FINDINGS  atherosclerotic plaque or mural thrombus. This has increased from CT 09/20/2019. Mediastinum/Nodes: Debris within the right posterior trachea. Unremarkable esophagus. No thoracic adenopathy. Lungs/Pleura: Lungs are clear. Mild diffuse bronchial wall thickening. No pleural effusion or pneumothorax. Upper Abdomen: No acute abnormality. Vicarious excretion of contrast in the bladder. Musculoskeletal: No acute fracture. Review of the MIP images confirms the above findings. IMPRESSION: 1. Nonocclusive filling defects in two segmental right upper lobe pulmonary arteries suspicious for acute pulmonary embolism. There is some motion in this area and this could be artifactual. Consider lower extremity venous ultrasound to look for possible source. 2. There is a 6 mm oval nodule along the medial wall of the aortic arch. Differential considerations include a mural thrombus or small intimal flap. 3. 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 This has increased from CT 09/20/2019. 4. Debris within the trachea. Critical Value/emergent results were called by telephone at the time of interpretation on 04/26/2023 at 11:48 pm to provider Dr. Amada Jupiter, who verbally acknowledged these results. Electronically Signed   By: Minerva Fester M.D.   On: 04/27/2023 00:00   ECHOCARDIOGRAM COMPLETE  Result Date: 04/26/2023    ECHOCARDIOGRAM REPORT   Patient Name:   Mason [name] [facility] Date of Exam: 04/26/2023 Medical Rec #:  [id number]         Height:       69.5 in Accession #:    [id number]        Weight:       180.3 lb Date of Birth:  1946/07/04        BSA:          1.987 m Patient Age:    77 years          BP:           128/81 mmHg Patient Gender: M                 HR:           69 bpm. Exam Location:  Inpatient Procedure: 2D Echo, Cardiac  Doppler and Color Doppler Indications:    Stroke I63.9  History:        Patient has prior history of Echocardiogram examinations, most                 recent 10/14/2022. Cancer, Signs/Symptoms:Altered Mental Status;                 Risk Factors:Former Smoker.  Sonographer:    Aron Baba Referring Phys: [phone] MCNEILL P KIRKPATRICK  Sonographer Comments: Suboptimal subcostal window. Image acquisition challenging due to respiratory motion. IMPRESSIONS  1. Left ventricular ejection fraction, by estimation, is 60 to 65%. The left ventricle has normal function. The left ventricle has no regional wall motion abnormalities. There is mild left ventricular hypertrophy. Left ventricular diastolic parameters are consistent with Grade I diastolic dysfunction (impaired relaxation).  2. Right ventricular systolic function is normal. The right ventricular size is normal. There is normal pulmonary artery systolic pressure.  3. The mitral valve is grossly normal. No evidence of mitral valve regurgitation.  4. Aortic valve regurgitation is not visualized.  5. The inferior vena cava is normal in size with greater than 50% respiratory variability, suggesting right atrial pressure of 3 mmHg. Conclusion(s)/Recommendation(s): No intracardiac source of embolism detected on this transthoracic study. Consider a transesophageal echocardiogram to exclude cardiac source of embolism if clinically indicated. Compared to prior study less MR. FINDINGS  Patient Details Name: Mason [name] MRN: [id number] Date of Birth: 1946-06-15 Today's Date: 04/26/2023 HPI/PMH: HPI: Pt is a 77 y.o. male who presneted on 04/25/23 with right sided weakness and difficulty speaking. CT head revealed acute intraparenchymal hematoma at the left thalamic capsular region.   Significant PMH includes diverticulitius, GERD, A-fib. Clinical Impression: Clinical Impression: Pt seen by SLP for modified barium swallow study following bedside swallow evaluation post stroke. Pt presents with a moderate oropharyngeal dysphagia primarily chracterised by mistiming and coordination of epiglottic inversion and laryngeal vestibule closure resulting in silent aspiration of thin liquids. Dysfunction of coordination can likely be attributed to impairment of motor planning and sensation of swallowing mechanisms as a result of stroke. Gross silent aspiration and penetration occured during thin and nectar consistency adminstration. Mistiming of epiglottic inversion resulted in more than 50% bolus entering airway. Aspiration did not ellicit cough response. When cued to cough, aspirate did eject from airway. No aspiration observed with honey thick, puree, or solid consistencies. Belching and hiccuping observed at end of study, which can be attributted to pt hx of GERD. Chin tuck manuever effectively prevented bolus from entering airway with nectar thick, but penetration did occur. Pt did not effectievly utilize chin tuck, as he would untuck his chin when swallow was initiated. Chin tuck will liekly be an effective compensatory strategy given cognitive status improves. SLP  reccommendation of dys 2 (minced) and honey thick liquid. ST will continue to f/u to ensure diet toleration, use of compensatory strategies, and upgraded PO trials. Factors that may increase risk of adverse event in presence of aspiration Rubye Oaks & Clearance Coots 2021): Factors that may increase risk of adverse event in presence of aspiration Rubye Oaks & Clearance Coots 2021): Reduced cognitive function Recommendations/Plan: Swallowing Evaluation Recommendations Swallowing Evaluation Recommendations Recommendations: PO diet PO Diet Recommendation: Dysphagia 2 (Finely chopped); Moderately thick liquids (Level 3, honey thick) Liquid Administration via: Spoon; Cup Medication Administration: Whole meds with puree Supervision: Full supervision/cueing for swallowing strategies; Full assist for feeding Swallowing strategies  : Slow rate; Small bites/sips; Chin tuck Postural changes: Position pt fully upright for meals Oral care recommendations: Oral care BID (2x/day); Staff/trained caregiver to provide oral care Treatment Plan Treatment Plan Treatment recommendations: Therapy as outlined in treatment plan below Follow-up recommendations: Acute inpatient rehab (3 hours/day) Functional status assessment: Patient has had a recent decline in their functional status and demonstrates the ability to make significant improvements in function in a reasonable and predictable amount of time. Treatment frequency: Min 3x/week Treatment duration: 2 weeks Interventions: Compensatory techniques; Patient/family education; Trials of upgraded texture/liquids; Diet toleration management by SLP Recommendations Recommendations for follow up therapy are one component of a multi-disciplinary discharge planning process, led by the attending physician.  Recommendations may be updated based on patient status, additional functional criteria and insurance authorization. Assessment: Orofacial Exam: Orofacial Exam Oral Cavity: Oral Hygiene: WFL Oral Cavity - Dentition:  Adequate natural dentition Orofacial Anatomy: WFL Oral Motor/Sensory Function: Suspected cranial nerve impairment CN V - Trigeminal: Right sensory impairment; Right motor impairment CN VII - Facial: Right motor impairment CN IX - Glossopharyngeal, CN X - Vagus: Right motor impairment CN XII - Hypoglossal: Right motor impairment Anatomy: Anatomy: Suspected cervical osteophytes Boluses Administered: Boluses Administered Boluses Administered: Thin liquids (Level 0); Mildly thick liquids (Level 2, nectar thick); Moderately thick liquids (Level 3, honey thick); Puree; Solid  Oral Impairment Domain: Oral Impairment Domain Lip Closure: Interlabial escape, no progression to anterior lip Tongue control during bolus hold: Cohesive bolus between tongue  atherosclerotic plaque or mural thrombus. This has increased from CT 09/20/2019. Mediastinum/Nodes: Debris within the right posterior trachea. Unremarkable esophagus. No thoracic adenopathy. Lungs/Pleura: Lungs are clear. Mild diffuse bronchial wall thickening. No pleural effusion or pneumothorax. Upper Abdomen: No acute abnormality. Vicarious excretion of contrast in the bladder. Musculoskeletal: No acute fracture. Review of the MIP images confirms the above findings. IMPRESSION: 1. Nonocclusive filling defects in two segmental right upper lobe pulmonary arteries suspicious for acute pulmonary embolism. There is some motion in this area and this could be artifactual. Consider lower extremity venous ultrasound to look for possible source. 2. There is a 6 mm oval nodule along the medial wall of the aortic arch. Differential considerations include a mural thrombus or small intimal flap. 3. 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 This has increased from CT 09/20/2019. 4. Debris within the trachea. Critical Value/emergent results were called by telephone at the time of interpretation on 04/26/2023 at 11:48 pm to provider Dr. Amada Jupiter, who verbally acknowledged these results. Electronically Signed   By: Minerva Fester M.D.   On: 04/27/2023 00:00   ECHOCARDIOGRAM COMPLETE  Result Date: 04/26/2023    ECHOCARDIOGRAM REPORT   Patient Name:   Mason [name] [facility] Date of Exam: 04/26/2023 Medical Rec #:  [id number]         Height:       69.5 in Accession #:    [id number]        Weight:       180.3 lb Date of Birth:  1946/07/04        BSA:          1.987 m Patient Age:    77 years          BP:           128/81 mmHg Patient Gender: M                 HR:           69 bpm. Exam Location:  Inpatient Procedure: 2D Echo, Cardiac  Doppler and Color Doppler Indications:    Stroke I63.9  History:        Patient has prior history of Echocardiogram examinations, most                 recent 10/14/2022. Cancer, Signs/Symptoms:Altered Mental Status;                 Risk Factors:Former Smoker.  Sonographer:    Aron Baba Referring Phys: [phone] MCNEILL P KIRKPATRICK  Sonographer Comments: Suboptimal subcostal window. Image acquisition challenging due to respiratory motion. IMPRESSIONS  1. Left ventricular ejection fraction, by estimation, is 60 to 65%. The left ventricle has normal function. The left ventricle has no regional wall motion abnormalities. There is mild left ventricular hypertrophy. Left ventricular diastolic parameters are consistent with Grade I diastolic dysfunction (impaired relaxation).  2. Right ventricular systolic function is normal. The right ventricular size is normal. There is normal pulmonary artery systolic pressure.  3. The mitral valve is grossly normal. No evidence of mitral valve regurgitation.  4. Aortic valve regurgitation is not visualized.  5. The inferior vena cava is normal in size with greater than 50% respiratory variability, suggesting right atrial pressure of 3 mmHg. Conclusion(s)/Recommendation(s): No intracardiac source of embolism detected on this transthoracic study. Consider a transesophageal echocardiogram to exclude cardiac source of embolism if clinically indicated. Compared to prior study less MR. FINDINGS  Patient Details Name: Mason [name] MRN: [id number] Date of Birth: 1946-06-15 Today's Date: 04/26/2023 HPI/PMH: HPI: Pt is a 77 y.o. male who presneted on 04/25/23 with right sided weakness and difficulty speaking. CT head revealed acute intraparenchymal hematoma at the left thalamic capsular region.   Significant PMH includes diverticulitius, GERD, A-fib. Clinical Impression: Clinical Impression: Pt seen by SLP for modified barium swallow study following bedside swallow evaluation post stroke. Pt presents with a moderate oropharyngeal dysphagia primarily chracterised by mistiming and coordination of epiglottic inversion and laryngeal vestibule closure resulting in silent aspiration of thin liquids. Dysfunction of coordination can likely be attributed to impairment of motor planning and sensation of swallowing mechanisms as a result of stroke. Gross silent aspiration and penetration occured during thin and nectar consistency adminstration. Mistiming of epiglottic inversion resulted in more than 50% bolus entering airway. Aspiration did not ellicit cough response. When cued to cough, aspirate did eject from airway. No aspiration observed with honey thick, puree, or solid consistencies. Belching and hiccuping observed at end of study, which can be attributted to pt hx of GERD. Chin tuck manuever effectively prevented bolus from entering airway with nectar thick, but penetration did occur. Pt did not effectievly utilize chin tuck, as he would untuck his chin when swallow was initiated. Chin tuck will liekly be an effective compensatory strategy given cognitive status improves. SLP  reccommendation of dys 2 (minced) and honey thick liquid. ST will continue to f/u to ensure diet toleration, use of compensatory strategies, and upgraded PO trials. Factors that may increase risk of adverse event in presence of aspiration Rubye Oaks & Clearance Coots 2021): Factors that may increase risk of adverse event in presence of aspiration Rubye Oaks & Clearance Coots 2021): Reduced cognitive function Recommendations/Plan: Swallowing Evaluation Recommendations Swallowing Evaluation Recommendations Recommendations: PO diet PO Diet Recommendation: Dysphagia 2 (Finely chopped); Moderately thick liquids (Level 3, honey thick) Liquid Administration via: Spoon; Cup Medication Administration: Whole meds with puree Supervision: Full supervision/cueing for swallowing strategies; Full assist for feeding Swallowing strategies  : Slow rate; Small bites/sips; Chin tuck Postural changes: Position pt fully upright for meals Oral care recommendations: Oral care BID (2x/day); Staff/trained caregiver to provide oral care Treatment Plan Treatment Plan Treatment recommendations: Therapy as outlined in treatment plan below Follow-up recommendations: Acute inpatient rehab (3 hours/day) Functional status assessment: Patient has had a recent decline in their functional status and demonstrates the ability to make significant improvements in function in a reasonable and predictable amount of time. Treatment frequency: Min 3x/week Treatment duration: 2 weeks Interventions: Compensatory techniques; Patient/family education; Trials of upgraded texture/liquids; Diet toleration management by SLP Recommendations Recommendations for follow up therapy are one component of a multi-disciplinary discharge planning process, led by the attending physician.  Recommendations may be updated based on patient status, additional functional criteria and insurance authorization. Assessment: Orofacial Exam: Orofacial Exam Oral Cavity: Oral Hygiene: WFL Oral Cavity - Dentition:  Adequate natural dentition Orofacial Anatomy: WFL Oral Motor/Sensory Function: Suspected cranial nerve impairment CN V - Trigeminal: Right sensory impairment; Right motor impairment CN VII - Facial: Right motor impairment CN IX - Glossopharyngeal, CN X - Vagus: Right motor impairment CN XII - Hypoglossal: Right motor impairment Anatomy: Anatomy: Suspected cervical osteophytes Boluses Administered: Boluses Administered Boluses Administered: Thin liquids (Level 0); Mildly thick liquids (Level 2, nectar thick); Moderately thick liquids (Level 3, honey thick); Puree; Solid  Oral Impairment Domain: Oral Impairment Domain Lip Closure: Interlabial escape, no progression to anterior lip Tongue control during bolus hold: Cohesive bolus between tongue  atherosclerotic plaque or mural thrombus. This has increased from CT 09/20/2019. Mediastinum/Nodes: Debris within the right posterior trachea. Unremarkable esophagus. No thoracic adenopathy. Lungs/Pleura: Lungs are clear. Mild diffuse bronchial wall thickening. No pleural effusion or pneumothorax. Upper Abdomen: No acute abnormality. Vicarious excretion of contrast in the bladder. Musculoskeletal: No acute fracture. Review of the MIP images confirms the above findings. IMPRESSION: 1. Nonocclusive filling defects in two segmental right upper lobe pulmonary arteries suspicious for acute pulmonary embolism. There is some motion in this area and this could be artifactual. Consider lower extremity venous ultrasound to look for possible source. 2. There is a 6 mm oval nodule along the medial wall of the aortic arch. Differential considerations include a mural thrombus or small intimal flap. 3. 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 This has increased from CT 09/20/2019. 4. Debris within the trachea. Critical Value/emergent results were called by telephone at the time of interpretation on 04/26/2023 at 11:48 pm to provider Dr. Amada Jupiter, who verbally acknowledged these results. Electronically Signed   By: Minerva Fester M.D.   On: 04/27/2023 00:00   ECHOCARDIOGRAM COMPLETE  Result Date: 04/26/2023    ECHOCARDIOGRAM REPORT   Patient Name:   Mason [name] [facility] Date of Exam: 04/26/2023 Medical Rec #:  [id number]         Height:       69.5 in Accession #:    [id number]        Weight:       180.3 lb Date of Birth:  1946/07/04        BSA:          1.987 m Patient Age:    77 years          BP:           128/81 mmHg Patient Gender: M                 HR:           69 bpm. Exam Location:  Inpatient Procedure: 2D Echo, Cardiac  Doppler and Color Doppler Indications:    Stroke I63.9  History:        Patient has prior history of Echocardiogram examinations, most                 recent 10/14/2022. Cancer, Signs/Symptoms:Altered Mental Status;                 Risk Factors:Former Smoker.  Sonographer:    Aron Baba Referring Phys: [phone] MCNEILL P KIRKPATRICK  Sonographer Comments: Suboptimal subcostal window. Image acquisition challenging due to respiratory motion. IMPRESSIONS  1. Left ventricular ejection fraction, by estimation, is 60 to 65%. The left ventricle has normal function. The left ventricle has no regional wall motion abnormalities. There is mild left ventricular hypertrophy. Left ventricular diastolic parameters are consistent with Grade I diastolic dysfunction (impaired relaxation).  2. Right ventricular systolic function is normal. The right ventricular size is normal. There is normal pulmonary artery systolic pressure.  3. The mitral valve is grossly normal. No evidence of mitral valve regurgitation.  4. Aortic valve regurgitation is not visualized.  5. The inferior vena cava is normal in size with greater than 50% respiratory variability, suggesting right atrial pressure of 3 mmHg. Conclusion(s)/Recommendation(s): No intracardiac source of embolism detected on this transthoracic study. Consider a transesophageal echocardiogram to exclude cardiac source of embolism if clinically indicated. Compared to prior study less MR. FINDINGS  Patient Details Name: Mason [name] MRN: [id number] Date of Birth: 1946-06-15 Today's Date: 04/26/2023 HPI/PMH: HPI: Pt is a 77 y.o. male who presneted on 04/25/23 with right sided weakness and difficulty speaking. CT head revealed acute intraparenchymal hematoma at the left thalamic capsular region.   Significant PMH includes diverticulitius, GERD, A-fib. Clinical Impression: Clinical Impression: Pt seen by SLP for modified barium swallow study following bedside swallow evaluation post stroke. Pt presents with a moderate oropharyngeal dysphagia primarily chracterised by mistiming and coordination of epiglottic inversion and laryngeal vestibule closure resulting in silent aspiration of thin liquids. Dysfunction of coordination can likely be attributed to impairment of motor planning and sensation of swallowing mechanisms as a result of stroke. Gross silent aspiration and penetration occured during thin and nectar consistency adminstration. Mistiming of epiglottic inversion resulted in more than 50% bolus entering airway. Aspiration did not ellicit cough response. When cued to cough, aspirate did eject from airway. No aspiration observed with honey thick, puree, or solid consistencies. Belching and hiccuping observed at end of study, which can be attributted to pt hx of GERD. Chin tuck manuever effectively prevented bolus from entering airway with nectar thick, but penetration did occur. Pt did not effectievly utilize chin tuck, as he would untuck his chin when swallow was initiated. Chin tuck will liekly be an effective compensatory strategy given cognitive status improves. SLP  reccommendation of dys 2 (minced) and honey thick liquid. ST will continue to f/u to ensure diet toleration, use of compensatory strategies, and upgraded PO trials. Factors that may increase risk of adverse event in presence of aspiration Rubye Oaks & Clearance Coots 2021): Factors that may increase risk of adverse event in presence of aspiration Rubye Oaks & Clearance Coots 2021): Reduced cognitive function Recommendations/Plan: Swallowing Evaluation Recommendations Swallowing Evaluation Recommendations Recommendations: PO diet PO Diet Recommendation: Dysphagia 2 (Finely chopped); Moderately thick liquids (Level 3, honey thick) Liquid Administration via: Spoon; Cup Medication Administration: Whole meds with puree Supervision: Full supervision/cueing for swallowing strategies; Full assist for feeding Swallowing strategies  : Slow rate; Small bites/sips; Chin tuck Postural changes: Position pt fully upright for meals Oral care recommendations: Oral care BID (2x/day); Staff/trained caregiver to provide oral care Treatment Plan Treatment Plan Treatment recommendations: Therapy as outlined in treatment plan below Follow-up recommendations: Acute inpatient rehab (3 hours/day) Functional status assessment: Patient has had a recent decline in their functional status and demonstrates the ability to make significant improvements in function in a reasonable and predictable amount of time. Treatment frequency: Min 3x/week Treatment duration: 2 weeks Interventions: Compensatory techniques; Patient/family education; Trials of upgraded texture/liquids; Diet toleration management by SLP Recommendations Recommendations for follow up therapy are one component of a multi-disciplinary discharge planning process, led by the attending physician.  Recommendations may be updated based on patient status, additional functional criteria and insurance authorization. Assessment: Orofacial Exam: Orofacial Exam Oral Cavity: Oral Hygiene: WFL Oral Cavity - Dentition:  Adequate natural dentition Orofacial Anatomy: WFL Oral Motor/Sensory Function: Suspected cranial nerve impairment CN V - Trigeminal: Right sensory impairment; Right motor impairment CN VII - Facial: Right motor impairment CN IX - Glossopharyngeal, CN X - Vagus: Right motor impairment CN XII - Hypoglossal: Right motor impairment Anatomy: Anatomy: Suspected cervical osteophytes Boluses Administered: Boluses Administered Boluses Administered: Thin liquids (Level 0); Mildly thick liquids (Level 2, nectar thick); Moderately thick liquids (Level 3, honey thick); Puree; Solid  Oral Impairment Domain: Oral Impairment Domain Lip Closure: Interlabial escape, no progression to anterior lip Tongue control during bolus hold: Cohesive bolus between tongue

## 2023-04-27 NOTE — Progress Notes (Signed)
BLE venous duplex has been completed.  Results can be found under chart review under CV PROC. 04/27/2023 4:09 PM Aarti Mankowski RVT, RDMS

## 2023-04-27 NOTE — Progress Notes (Addendum)
Speech Language Pathology Treatment: Dysphagia;Cognitive-Linquistic  Patient Details Name: Mason [name] MRN: [id number] DOB: 02/15/1946 Today's Date: 04/27/2023 Time: 7846-9629 SLP Time Calculation (min) (ACUTE ONLY): 29 min  Assessment / Plan / Recommendation Clinical Impression  Pt seen with son present in room. He presents with significant R sided facial weakness and asymmetry, which appears to affect both speech and the oral phase of swallowing. Pt observed with trials of honey thick liquids, purees, and Dys 2 textures from his meal tray with no overt s/s of aspiration. Pt with minimal oral residue most prominent along his R buccal cavity, although appeared to clear completely with a cued lingual sweep and liquid wash. SLP attempted to prompt pt to perform a chin tuck posture, although pt's positioning was L leaning which made this posture difficult to achieve. Additionally, pt with receptive deficits and demonstrating difficulty maintaining chin tuck posture throughout entirety of the swallow. Suspect as cognition improves, pt will be more successful with use of compensatory strategies, although for now, current diet recommendation of Dys 2 textures with honey thick liquids remains most appropriate. Showed images from [facility] 9/4 to son and provided education regarding swallowing function and potential for improvement. Son verbalized understanding.   Pt continues to present with R sided neglect, although intermittently responds to verbal and visual cueing to increase attention to R side. Pt oriented x4, although responses are moderately dysarthric and often contain phonemic paraphasias or neologisms. Pt 90% accurate with yes/no questions, although accuracy increased to 100% when given Min verbal cueing. When prompted to express wants given binary choices, pt frequently demonstrated difficulty with initiation. Pt continues to have poor awareness of verbal errors, although demonstrated increased  awareness after SLP provided education regarding dysarthria and aphasia. Discussed current presentation with his son, who made inquiries regarding time frame for improvement and potential for return to cognitive linguistic baseline. SLP will continue to follow to target swallowing and cognitive linguistic goals.   HPI HPI: Pt is a 77 y.o. male who presneted on 04/25/23 with right sided weakness and difficulty speaking. CT head revealed acute intraparenchymal hematoma at the left thalamic capsular region.   Significant PMH includes diverticulitius, GERD, A-fib.      SLP Plan  Continue with current plan of care      Recommendations for follow up therapy are one component of a multi-disciplinary discharge planning process, led by the attending physician.  Recommendations may be updated based on patient status, additional functional criteria and insurance authorization.    Recommendations  Diet recommendations: Dysphagia 2 (fine chop);Honey-thick liquid Liquids provided via: Cup Medication Administration: Whole meds with puree Supervision: Staff to assist with self feeding;Full supervision/cueing for compensatory strategies Compensations: Minimize environmental distractions;Slow rate;Small sips/bites;Lingual sweep for clearance of pocketing;Monitor for anterior loss Postural Changes and/or Swallow Maneuvers: Seated upright 90 degrees                  Oral care BID;Staff/trained caregiver to provide oral care   Set up Supervision/Assistance Dysphagia, oropharyngeal phase (R13.12);Aphasia (R47.01);Dysarthria and anarthria (R47.1);Cognitive communication deficit (B28.413)     Continue with current plan of care     Gwynneth Aliment, M.A., CF-SLP Speech Language Pathology, Acute Rehabilitation Services  Secure Chat preferred [phone]   04/27/2023, 10:50 AM

## 2023-04-27 NOTE — Progress Notes (Signed)
04/27/2023 Reviewed imaging: minimal clot burden if even present; would check lower ext duplex, if + can do IVC filter otherwise no strong indication for [facility].  Duplex ordered.  Discussed with Dr. Pearlean Brownie.  Myrla Halsted MD PCCM

## 2023-04-28 DIAGNOSIS — I610 Nontraumatic intracerebral hemorrhage in hemisphere, subcortical: Secondary | ICD-10-CM

## 2023-04-28 LAB — CBC
HCT: 53.3 % — ABNORMAL HIGH (ref 39.0–52.0)
Hemoglobin: 17.8 g/dL — ABNORMAL HIGH (ref 13.0–17.0)
MCH: 27.9 pg (ref 26.0–34.0)
MCHC: 33.4 g/dL (ref 30.0–36.0)
MCV: 83.7 fL (ref 80.0–100.0)
Platelets: 478 10*3/uL — ABNORMAL HIGH (ref 150–400)
RBC: 6.37 MIL/uL — ABNORMAL HIGH (ref 4.22–5.81)
RDW: 17.3 % — ABNORMAL HIGH (ref 11.5–15.5)
WBC: 20.4 10*3/uL — ABNORMAL HIGH (ref 4.0–10.5)
nRBC: 0 % (ref 0.0–0.2)

## 2023-04-28 LAB — BASIC METABOLIC PANEL
Anion gap: 9 (ref 5–15)
BUN: 19 mg/dL (ref 8–23)
CO2: 24 mmol/L (ref 22–32)
Calcium: 8.4 mg/dL — ABNORMAL LOW (ref 8.9–10.3)
Chloride: 102 mmol/L (ref 98–111)
Creatinine, Ser: 1.03 mg/dL (ref 0.61–1.24)
GFR, Estimated: >60 mL/min (ref >60)
Glucose, Bld: 109 mg/dL — ABNORMAL HIGH (ref 70–99)
Potassium: 3.7 mmol/L (ref 3.5–5.1)
Sodium: 135 mmol/L (ref 135–145)

## 2023-04-28 MED ORDER — LOSARTAN POTASSIUM 25 MG PO TABS
25.0000 mg | ORAL_TABLET | Freq: Every day | ORAL | Status: DC
  Administered 2023-04-28 – 2023-05-05 (×8): 25 mg via ORAL
  Filled 2023-04-28 (×8): qty 1

## 2023-04-28 NOTE — Progress Notes (Signed)
Physical Therapy Treatment Patient Details Name: Mason [name] MRN: [id number] DOB: 12/06/1945 Today's Date: 04/28/2023   History of Present Illness Pt is a 77 y.o. male who presented on 04/25/23 with right sided weakness and difficulty speaking. CT head with L subcortical hemorrhage in setting of Xarelto use.   Significant PMH includes diverticulitius, GERD, A-fib.    PT Comments  Patient resting in bed and son at bedside, pt denies pain and agreeable to therapy. Pt requires encouragement throughout to attempt initiating movement with Rt side UE/LE often verbalizing "can't" when cues to attempt movement. Pt followed cues for Rt visual tracking and to turn head to scan Rt, able to find clock and note correct time, reported clear vision when gaze maintained. Pt performed lateral weight shifting at EOB with mod-max assist to return to midline, frequent cues needed for head control to extend at cervical spine for improved posture. Seated postural exercises completed with inspiration to facilitate Rt scapular retraction as well as assist from OT. Maxisky lift for transfer from EOB to recliner and pt repositioned in chair for comfort though reporting uncomfortable but unable to verbalize how. Pt oriented to call bell and use to call for assist to return to bed when fatigued or too uncomfortable, son at bedside and educated on engaging pt on Rt side to increase attention to Rt, pt's head propped with towel roll to facilitate midline c-spine alignment. Will progress pt as able, continue to recommend intense rehab follow up >3 hours/day.    If plan is discharge home, recommend the following: Two people to help with walking and/or transfers;Two people to help with bathing/dressing/bathroom   Can travel by private vehicle        Equipment Recommendations  Other (comment)    Recommendations for Other Services Rehab consult     Precautions / Restrictions Precautions Precautions: Fall Precaution  Comments: R hemiparesis Restrictions Weight Bearing Restrictions: No     Mobility  Bed Mobility Overal bed mobility: Needs Assistance Bed Mobility: Supine to Sit Rolling: Used rails, Max assist, +2 for safety/equipment   Supine to sit: Max assist, +2 for physical assistance, +2 for safety/equipment, HOB elevated, Used rails     General bed mobility comments: Mod-Max assist for rolling with greater assist to roll Rt due to Lt hemi. Pt required step by step simple repeated cues to reach for bed rail to roll Rt, reports Lt shoulder pain in Rt sidelying. sling placed for maxisky. Max +2 to move Lt side to sit EOB, max assist to bring LE's off EOB, pt initiated bringing Lt towards edge, total assist for Rt LE and to raise trunk.    Transfers Overall transfer level: Needs assistance                 General transfer comment: Total Assist via lift to chair. repositioned with pillows for posture in chair. Transfer via Materials engineer Bed    Modified Rankin (Stroke Patients Only)       Balance Overall balance assessment: Needs assistance Sitting-balance support: No upper extremity supported, Feet supported, Single extremity supported Sitting balance-Leahy Scale: Poor Sitting balance - Comments: Seated EOB completed balance activities. pt heavily reliant on Lt Ue for balance support to lean into Lt arm or hold Lt  knee and push/pull for posture. balance activities completed at EOB. Postural control: Posterior lean, Right lateral lean                                  Cognition Arousal: Lethargic, Alert Behavior During Therapy: Flat affect Overall Cognitive Status: Impaired/Different from baseline Area of Impairment: Orientation, Attention, Memory, Following commands, Safety/judgement, Awareness, Problem solving                 Orientation Level:  Disoriented to, Time Current Attention Level: Sustained Memory: Decreased recall of precautions, Decreased short-term memory Following Commands: Follows one step commands with increased time, Follows one step commands inconsistently Safety/Judgement: Decreased awareness of safety, Decreased awareness of deficits Awareness: Intellectual, Emergent Problem Solving: Slow processing, Decreased initiation, Difficulty sequencing, Requires verbal cues, Requires tactile cues General Comments: varied cognition throughout, pt seems to be attending to Rt side more        Exercises Other Exercises Other Exercises: cervical lateral flexion to Rt side, 5x (limited extension with head in forward flexed position) Other Exercises: Bil Forearm prop completed with return to midline, 5x on Lt UE, 3x on Rt UE with mod-max assist to return to midline from Rt. Other Exercises: scapular retraction/ pectoral stretch with manual facilitation an dcues for deep inspiration facilitating Rt scapular mobility. ~5x. Other Exercises: pt reaching with Lt Ue for washcloth and able to bring washcloth to face to wipe forehead.    General Comments        Pertinent Vitals/Pain Pain Assessment Pain Assessment: Faces Faces Pain Scale: Hurts little more Pain Location: generalized, unable to state Pain Descriptors / Indicators: Grimacing, Moaning, Discomfort Pain Intervention(s): Limited activity within patient's tolerance, Monitored during session, Repositioned    Home Living                          Prior Function            PT Goals (current goals can now be found in the care plan section) Acute Rehab PT Goals Patient Stated Goal: unable to state PT Goal Formulation: With patient Time For Goal Achievement: 05/10/23 Potential to Achieve Goals: Good Progress towards PT goals: Progressing toward goals    Frequency    Min 1X/week      PT Plan      Co-evaluation PT/OT/SLP  Co-Evaluation/Treatment: Yes Reason for Co-Treatment: For patient/therapist safety;To address functional/ADL transfers;Necessary to address cognition/behavior during functional activity;Complexity of the patient's impairments (multi-system involvement) PT goals addressed during session: Mobility/safety with mobility;Balance        AM-PAC PT "6 Clicks" Mobility   Outcome Measure  Help needed turning from your back to your side while in a flat bed without using bedrails?: A Lot Help needed moving from lying on your back to sitting on the side of a flat bed without using bedrails?: Total Help needed moving to and from a bed to a chair (including a wheelchair)?: Total Help needed standing up from a chair using your arms (e.g., wheelchair or bedside chair)?: Total Help needed to walk in hospital room?: Total Help needed climbing 3-5 steps with a railing? : Total 6 Click Score: 7    End of Session Equipment Utilized During Treatment: Gait belt Activity Tolerance: Patient tolerated treatment well Patient left: in bed;with call bell/phone within reach;with bed alarm set Nurse Communication: Mobility status PT Visit Diagnosis: Other abnormalities of gait and  mobility (R26.89);Muscle weakness (generalized) (M62.81);Hemiplegia and hemiparesis Hemiplegia - Right/Left: Right Hemiplegia - dominant/non-dominant: Dominant Hemiplegia - caused by: Nontraumatic intracerebral hemorrhage     Time: 1610-9604 PT Time Calculation (min) (ACUTE ONLY): 49 min  Charges:    $Therapeutic Activity: 8-22 mins PT General Charges $$ ACUTE PT VISIT: 1 Visit                     Wynn Maudlin, DPT Acute Rehabilitation Services Office [phone]  04/28/23 2:02 PM

## 2023-04-28 NOTE — Progress Notes (Signed)
Inpatient Rehabilitation Admissions Coordinator   I will place order for rehab consult to assess candidacy for possible CIR admit.  Ottie Glazier, RN, MSN Rehab Admissions Coordinator [phone] 04/28/2023 4:45 PM

## 2023-04-28 NOTE — Progress Notes (Addendum)
Speech Language Pathology Treatment: Dysphagia;Cognitive-Linquistic  Patient Details Name: Mason [name] MRN: [id number] DOB: 12-25-1945 Today's Date: 04/28/2023 Time: 5427-0623 SLP Time Calculation (min) (ACUTE ONLY): 38 min  Assessment / Plan / Recommendation Clinical Impression  Pt positioned upright in chair throughout session. Observed pt with trials of honey thick liquids as well as Dys 2 and Dys 3 texture solids with no overt s/s of aspiration. Pt continues to have poor awareness of bolus presentations and significant anterior spillage specifically on the R side. Given cueing, pt able to initiate an effective lingual sweep to clear R buccal pocketing. Pt able to more consistently use a chin tuck posture with presentations of honey thick liquids via spoon x5 and independent cup sip x2 given Min verbal cueing. Plan to trial upgraded consistencies in future sessions if pt continues to be able to consistently use compensatory strategies.  Pt making multiple attempts at verbal communication today, although it remains largely unintelligible due to moderate dysarthria, phonemic paraphasias, and reduced awareness. Given Min verbal cueing, pt able to initiate use of speech intelligibility strategies (slow, loud, over-annunciate, pause) at the sentence level, although this was significantly affected by pt's aphasia. Pt requiring Mod cueing to use L hand to self-feed. He was understandably more frustrated today with expressive errors, although is responsive to cueing and shows great potential to continue to progress given ongoing intensive therapy. Pt requesting to be moved back to bed end of session. RN notified and SLP provided assistance with transfer. Will continue to follow.    HPI HPI: Pt is a 77 y.o. male who presneted on 04/25/23 with right sided weakness and difficulty speaking. CT head revealed acute intraparenchymal hematoma at the left thalamic capsular region.   Significant PMH includes  diverticulitius, GERD, A-fib.      SLP Plan  Continue with current plan of care      Recommendations for follow up therapy are one component of a multi-disciplinary discharge planning process, led by the attending physician.  Recommendations may be updated based on patient status, additional functional criteria and insurance authorization.    Recommendations  Diet recommendations: Dysphagia 2 (fine chop);Honey-thick liquid Liquids provided via: Cup Medication Administration: Whole meds with puree Supervision: Staff to assist with self feeding;Full supervision/cueing for compensatory strategies Compensations: Minimize environmental distractions;Slow rate;Small sips/bites;Lingual sweep for clearance of pocketing;Monitor for anterior loss Postural Changes and/or Swallow Maneuvers: Seated upright 90 degrees                  Oral care BID;Staff/trained caregiver to provide oral care   Set up Supervision/Assistance Dysphagia, oropharyngeal phase (R13.12);Aphasia (R47.01);Dysarthria and anarthria (R47.1);Cognitive communication deficit (J62.831)     Continue with current plan of care     Gwynneth Aliment, M.A., CF-SLP Speech Language Pathology, Acute Rehabilitation Services  Secure Chat preferred [phone]   04/28/2023, 3:01 PM

## 2023-04-28 NOTE — Progress Notes (Addendum)
STROKE TEAM PROGRESS NOTE   BRIEF HPI Mason [name] is a 77 y.o. male with a history of atrial fibrillation diagnosed about 4 years ago per patient who has been managed on Xarelto since that time.  He was in his normal state of health until 8 PM at which point he started noticing right-sided weakness.  Patient was not sure if he took his xarelto or not.    SIGNIFICANT HOSPITAL EVENTS 9/3: 3ml acute IPH, No I'VE 9/4: interval increase in hemorrhage size.    INTERIM HISTORY/SUBJECTIVE  Family at the bedside. He is on Cleviprex gtt @ 7mg , will add losartan 25 mg today. Will change BP goal to <160 WBC 20.4, afebrile.   CXR negative for acute process, blood cultures NGTD LE Duplex negative for DVT.    OBJECTIVE  CBC    Component Value Date/Time   WBC 23.4 (H) 04/27/2023 0631   RBC 6.42 (H) 04/27/2023 0631   HGB 18.6 (H) 04/27/2023 0631   HCT 54.0 (H) 04/27/2023 0631   PLT 490 (H) 04/27/2023 0631   MCV 84.1 04/27/2023 0631   MCH 29.0 04/27/2023 0631   MCHC 34.4 04/27/2023 0631   RDW 16.7 (H) 04/27/2023 0631   LYMPHSABS 3.4 04/25/2023 2106   MONOABS 1.0 04/25/2023 2106   EOSABS 0.5 04/25/2023 2106   BASOSABS 0.2 (H) 04/25/2023 2106    BMET    Component Value Date/Time   NA 132 (L) 04/27/2023 0631   K 4.3 04/27/2023 0631   CL 100 04/27/2023 0631   CO2 21 (L) 04/27/2023 0631   GLUCOSE 133 (H) 04/27/2023 0631   BUN 17 04/27/2023 0631   CREATININE 1.05 04/27/2023 0631   CALCIUM 8.7 (L) 04/27/2023 0631   GFRNONAA >60 04/27/2023 0631    IMAGING past 24 hours VAS [place] LOWER EXTREMITY VENOUS (DVT)  Result Date: 04/27/2023  Lower Venous DVT Study Patient Name:  Mason [name] [facility]  Date of Exam:   04/27/2023 Medical Rec #: [id number]          Accession #:    [id number] Date of Birth: 02/20/1946         Patient Gender: M Patient Age:   55 years Exam Location:  [facility] Procedure:      VAS [place] LOWER EXTREMITY VENOUS (DVT) Referring Phys: STEPHANIE REESE  --------------------------------------------------------------------------------  Indications: Pulmonary embolism, and stroke.  Limitations: Patient movement. Comparison Study: No previous exams Performing Technologist: Jody Hill RVT, RDMS  Examination Guidelines: A complete evaluation includes B-mode imaging, spectral Doppler, color Doppler, and power Doppler as needed of all accessible portions of each vessel. Bilateral testing is considered an integral part of a complete examination. Limited examinations for reoccurring indications may be performed as noted. The reflux portion of the exam is performed with the patient in reverse Trendelenburg.  +---------+---------------+---------+-----------+----------+--------------+ RIGHT    CompressibilityPhasicitySpontaneityPropertiesThrombus Aging +---------+---------------+---------+-----------+----------+--------------+ CFV      Full           Yes      Yes                                 +---------+---------------+---------+-----------+----------+--------------+ SFJ      Full                                                        +---------+---------------+---------+-----------+----------+--------------+  FV Prox  Full           Yes      Yes                                 +---------+---------------+---------+-----------+----------+--------------+ FV Mid   Full           Yes      Yes                                 +---------+---------------+---------+-----------+----------+--------------+ FV DistalFull           Yes      Yes                                 +---------+---------------+---------+-----------+----------+--------------+ PFV      Full                                                        +---------+---------------+---------+-----------+----------+--------------+ POP      Full           Yes      Yes                                 +---------+---------------+---------+-----------+----------+--------------+  PTV      Full                                                        +---------+---------------+---------+-----------+----------+--------------+ PERO     Full                                                        +---------+---------------+---------+-----------+----------+--------------+   +---------+---------------+---------+-----------+----------+--------------+ LEFT     CompressibilityPhasicitySpontaneityPropertiesThrombus Aging +---------+---------------+---------+-----------+----------+--------------+ CFV      Full           Yes      Yes                                 +---------+---------------+---------+-----------+----------+--------------+ SFJ      Full                                                        +---------+---------------+---------+-----------+----------+--------------+ FV Prox  Full           Yes      Yes                                 +---------+---------------+---------+-----------+----------+--------------+ FV Mid   Full  Yes      Yes                                 +---------+---------------+---------+-----------+----------+--------------+ FV DistalFull           Yes      Yes                                 +---------+---------------+---------+-----------+----------+--------------+ PFV      Full                                                        +---------+---------------+---------+-----------+----------+--------------+ POP      Full           Yes      Yes                                 +---------+---------------+---------+-----------+----------+--------------+ PTV      Full                                                        +---------+---------------+---------+-----------+----------+--------------+ PERO     Full                                                        +---------+---------------+---------+-----------+----------+--------------+     Summary: BILATERAL: - No evidence of deep  vein thrombosis seen in the lower extremities, bilaterally. -No evidence of popliteal cyst, bilaterally.   *See table(s) above for measurements and observations.    Preliminary    DG CHEST PORT 1 VIEW  Result Date: 04/27/2023 CLINICAL DATA:  Leukocytosis. EXAM: PORTABLE CHEST 1 VIEW COMPARISON:  Chest x-ray October 09, 2022. FINDINGS: The heart size and mediastinal contours are within normal limits. Both lungs are clear. No visible pleural effusions or pneumothorax. No acute osseous abnormality. IMPRESSION: No active disease. Electronically Signed   By: Feliberto Harts M.D.   On: 04/27/2023 14:26    Vitals:   04/28/23 0615 04/28/23 0630 04/28/23 0645 04/28/23 0800  BP: (!) 142/89 133/87 (!) 145/87   Pulse: 88 86 88   Resp: 19 19 (!) 21   Temp:    98.1 F (36.7 C)  TempSrc:    Oral  SpO2: (!) 87% 92% 92%   Weight:        PHYSICAL EXAM General:  Alert, well-nourished, well-developed patient in no acute distress Psych:  Mood and affect appropriate for situation CV: Regular rate and rhythm on monitor Respiratory:  Regular, unlabored respirations on room air GI: Abdomen soft and nontender  NEURO:  Mental Status: Awake, alert, eyes spontaneously open, oriented to place. Speech/Language: is limited language output, dysarthric.  Poor attention.   Cranial Nerves:  II: PERRL. Visual fields full.  III, IV, VI: left gaze preference, but does cross midline and track.  Eyelids  elevate symmetrically.  V: Sensation is intact to light touch and symmetrical to face.  VII: Right facial droop VIII: hearing intact to voice. IX, X: Palate elevates symmetrically. Phonation is normal.  PP:IRJJOACZ shrug decreased on right.  XII: tongue is deviates to right  Motor: Right hemiplegia. Withdraws on right leg,  4+/5 LUE, 4/5 LLE. 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 CT repeat Interval increase in size of hemorrhage, now measuring 2.4 x 1.9 x 3.4 cm. Intraventricular hemorrhage is now present with layering blood in the posterior horns of both lateral ventricles. No hydrocephalus CTA head and neck high-grade stenosis of the mid right P2 segment.  CT chest Nonocclusive filling defects in two segmental right upper lobe pulmonary arteries suspicious for acute pulmonary embolism.  There is a 6 mm oval nodule along the medial wall of the aortic. There is moderate narrowing of the proximal left subclavian artery secondary to low-density atherosclerotic plaque or mural thrombus arch. MRI Brain :  2.0 x 2.3 x 3.0 cm (estimated volume 7 mL) intraparenchymal hemorrhage at the left thalamocapsular region, mildly increased in size as compared to prior CT from 04/25/2023. Surrounding vasogenic edema has mildly progressed. No underlying mass or other structural abnormality identified underlying the acute hemorrhage. 2D Echo EF 60-65% LDL 59 HgbA1c 6.0 UDS neg Bilateral Duplex negative  VTE prophylaxis - SCDs Xarelto (rivaroxaban) daily prior to admission, now on No antithrombotic due to IPH Therapy recommendations:  CIR Disposition:  pending  Atrial fibrillation Home Meds: Xarelto Xarelto reversed with Andexxa  Continue telemetry monitoring HR controlled Hold anticoagulation due to IPH  Aortic atherosclerosis  CTA neck showed 6 mm ulceration along the medial inferior aspect of the aortic arch, incompletely imaged. Recommend CT angio chest for further evaluation of the aorta. CTA chest Nonocclusive filling defects in two segmental right upper lobe pulmonary arteries suspicious for acute pulmonary embolism.  There is a 6 mm oval nodule along the medial wall of the aortic. There is moderate  narrowing of the proximal left subclavian artery secondary to low-density atherosclerotic plaque or mural thrombus arch.  Hypertension Home meds:  none UnStable BP goal: SBP < 160 Cleviprex gtt @ 7mg .  norvasc 10mg   Add losartan 25mg   Labetalol and Hydralazine IVP PRNs as well.   Hyperlipidemia Home meds:  none LDL 82, goal < 70 Consider starting statin at discharge  Dysphagia Patient has post-stroke dysphagia, SLP consulted On dys 2 and honey thick liquid Advance diet as tolerated Aspiration precuations  Other Stroke Risk Factors Family hx stroke (mother) Former cigarette and cigar smoker   Other active problems Leukocytosis, WBC 14.8->19.6->23.4->20.4 afebrile Hyponatremia Na 135   Hospital day # 3   Pt seen by Neuro NP/APP and later by MD. Note/plan to be edited by MD as needed.    Gevena Mart DNP, ACNPC-AG  [facility]  I have personally obtained history,examined this patient, reviewed notes, independently viewed imaging studies, participated in medical decision making and plan of care.ROS completed by me personally and pertinent positives fully documented  I have made any additions or clarifications directly to the above note. Agree with note above.  Patient is neurologically stable.  Blood pressure  adequately controlled and Cleviprex drip is being weaned down.  Continue strict blood pressure control with systolic goal now below 160.  MRI shows slight increase in size of the hemorrhage but exam is stable and improving.  His white count is elevated but has not had any fevers.  WBC count has come down from 23 to 20.  Plan increase p.o. and as needed blood pressure medications and wean and discontinue Cleviprex drip as tolerated.  Mobilize out of bed.  Therapy consults.  Discussed with family at the bedside and answered questions.This patient is critically ill and at significant risk of neurological worsening, death and care requires constant monitoring of vital  signs, hemodynamics,respiratory and cardiac monitoring, extensive review of multiple databases, frequent neurological assessment, discussion with family, other specialists and medical decision making of high complexity.I have made any additions or clarifications directly to the above note.This critical care time does not reflect procedure time, or teaching time or supervisory time of PA/NP/Med Resident etc but could involve care discussion time.  I spent 30 minutes of neurocritical care time  in the care of  this patient.      Delia Heady, MD Medical Director [facility] Pager: [phone] 04/28/2023 1:25 PM       To contact Stroke Continuity provider, please refer to [email or web address]. After hours, contact General Neurology

## 2023-04-28 NOTE — Progress Notes (Signed)
Occupational Therapy Treatment Patient Details Name: Mason [name] MRN: [id number] DOB: 1945/10/13 Today's Date: 04/28/2023   History of present illness Pt is a 77 y.o. male who presented on 04/25/23 with right sided weakness and difficulty speaking. CT head with L subcortical hemorrhage in setting of Xarelto use.   Significant PMH includes diverticulitius, GERD, A-fib.   OT comments  Patient supine in bed, lethargic but arousable. Son at side today. He continues to be limited by impaired cognition/communication, R hemiparesis, and impaired balance.  He requires max assist +2 for bed mobility, worked on EOB balance, posture and UE movement, and grooming at EOB, then transferred to recliner via [employer].  He was able to wash face with L hand but needs assist for bimanual tasks. Continues to require encouragement and education with attempting to complete movements or tasks, pt appears frustrated and typically voicing "I can't".  Placed pillow under R UE in chair, educated on importance due to subluxation.  Will follow acutely.       If plan is discharge home, recommend the following:  Two people to help with walking and/or transfers;Two people to help with bathing/dressing/bathroom;Assistance with cooking/housework;Assistance with feeding;Direct supervision/assist for medications management;Direct supervision/assist for financial management;Assist for transportation;Help with stairs or ramp for entrance   Equipment Recommendations  Other (comment) (defer)    Recommendations for Other Services Rehab consult    Precautions / Restrictions Precautions Precautions: Fall Precaution Comments: R hemiparesis Restrictions Weight Bearing Restrictions: No       Mobility Bed Mobility Overal bed mobility: Needs Assistance Bed Mobility: Supine to Sit Rolling: Used rails, Max assist, +2 for safety/equipment   Supine to sit: Max assist, +2 for physical assistance, +2 for safety/equipment, HOB  elevated, Used rails     General bed mobility comments: Mod-Max assist for rolling with greater assist to roll Rt due to Lt hemi. Pt required step by step simple repeated cues to reach for bed rail to roll Rt, reports Lt shoulder pain in Rt sidelying. sling placed for maxisky. Max +2 to move Lt side to sit EOB, max assist to bring LE's off EOB, pt initiated bringing Lt towards edge, total assist for Rt LE and to raise trunk.    Transfers Overall transfer level: Needs assistance                 General transfer comment: Total Assist via lift to chair. repositioned with pillows for posture in chair. Transfer via Lift Equipment: [employer] Overall balance assessment: Needs assistance Sitting-balance support: No upper extremity supported, Feet supported, Single extremity supported Sitting balance-Leahy Scale: Poor Sitting balance - Comments: Seated EOB completed balance activities. pt heavily reliant on Lt Ue for balance support to lean into Lt arm or hold Lt knee and push/pull for posture. balance activities completed at EOB. Postural control: Posterior lean, Right lateral lean                                 ADL either performed or assessed with clinical judgement   ADL Overall ADL's : Needs assistance/impaired   Eating/Feeding Details (indicate cue type and reason): son reports pt eating breakfast with setup assist, able to locate all items Grooming: Wash/dry face;Moderate assistance;Sitting Grooming Details (indicate cue type and reason): assist for balance at EOB, but cueing for initation of task             Lower Body Dressing: Total assistance;+2 for  safety/equipment;+2 for physical assistance;Sitting/lateral leans   Toilet Transfer: Total assistance;+2 for physical assistance;+2 for safety/equipment Toilet Transfer Details (indicate cue type and reason): used maxisky to recliner         Functional mobility during ADLs: Maximal assistance;+2 for  physical assistance;+2 for safety/equipment General ADL Comments: to EOB    Extremity/Trunk Assessment              Vision   Additional Comments: needs further assessment, cueing to turn head and intermittent difficulty locating clock on R side (but did read accurately and quickly)   Perception Perception Perception: Impaired Preception Impairment Details: Inattention/Neglect Perception-Other Comments: R sided inattention to enviornemnt and body   Praxis      Cognition Arousal: Lethargic, Alert Behavior During Therapy: Flat affect Overall Cognitive Status: Impaired/Different from baseline Area of Impairment: Orientation, Attention, Memory, Following commands, Safety/judgement, Awareness, Problem solving                 Orientation Level: Disoriented to, Time, Place Current Attention Level: Sustained Memory: Decreased recall of precautions, Decreased short-term memory Following Commands: Follows one step commands with increased time, Follows one step commands inconsistently Safety/Judgement: Decreased awareness of safety, Decreased awareness of deficits Awareness: Intellectual Problem Solving: Slow processing, Decreased initiation, Difficulty sequencing, Requires verbal cues, Requires tactile cues General Comments: reports he is in [place], pt continues to requires cueing to R side at times able to locate items without cueing but fades to max assist at times        Exercises Exercises: Other exercises Other Exercises Other Exercises: cervical lateral flexion to Rt side, 5x (limited extension with head in forward flexed position) Other Exercises: Bil Forearm prop completed with return to midline, 5x on Lt UE, 3x on Rt UE with mod-max assist to return to midline from Rt. Other Exercises: scapular retraction/ pectoral stretch with manual facilitation an dcues for deep inspiration facilitating Rt scapular mobility. ~5x.    Shoulder Instructions       General  Comments      Pertinent Vitals/ Pain       Pain Assessment Pain Assessment: Faces Faces Pain Scale: Hurts little more Pain Location: generalized, unable to state Pain Descriptors / Indicators: Grimacing, Moaning, Discomfort Pain Intervention(s): Limited activity within patient's tolerance, Monitored during session, Repositioned  Home Living                                          Prior Functioning/Environment              Frequency  Min 1X/week        Progress Toward Goals  OT Goals(current goals can now be found in the care plan section)  Progress towards OT goals: Progressing toward goals  Acute Rehab OT Goals Patient Stated Goal: none stated OT Goal Formulation: Patient unable to participate in goal setting Time For Goal Achievement: 05/10/23 Potential to Achieve Goals: Good  Plan      Co-evaluation    PT/OT/SLP Co-Evaluation/Treatment: Yes Reason for Co-Treatment: For patient/therapist safety;To address functional/ADL transfers;Necessary to address cognition/behavior during functional activity;Complexity of the patient's impairments (multi-system involvement) PT goals addressed during session: Mobility/safety with mobility;Balance OT goals addressed during session: ADL's and self-care      AM-PAC OT "6 Clicks" Daily Activity     Outcome Measure   Help from another person eating meals?: A Little Help from another person taking  care of personal grooming?: A Lot Help from another person toileting, which includes using toliet, bedpan, or urinal?: Total Help from another person bathing (including washing, rinsing, drying)?: A Lot Help from another person to put on and taking off regular upper body clothing?: A Lot Help from another person to put on and taking off regular lower body clothing?: Total 6 Click Score: 11    End of Session    OT Visit Diagnosis: Other abnormalities of gait and mobility (R26.89);Hemiplegia and  hemiparesis;Cognitive communication deficit (R41.841) Hemiplegia - Right/Left: Right Hemiplegia - dominant/non-dominant: Dominant Hemiplegia - caused by: Nontraumatic SAH   Activity Tolerance Patient tolerated treatment well   Patient Left in chair;with call bell/phone within reach;with chair alarm set;with family/visitor present   Nurse Communication Mobility status;Need for lift equipment        Time: 9528-4132 OT Time Calculation (min): 50 min  Charges: OT General Charges $OT Visit: 1 Visit OT Treatments $Self Care/Home Management : 8-22 mins $Neuromuscular Re-education: 8-22 mins  Barry Brunner, OT Acute Rehabilitation Services Office [phone]   Mason [name] 04/28/2023, 2:24 PM

## 2023-04-29 DIAGNOSIS — I610 Nontraumatic intracerebral hemorrhage in hemisphere, subcortical: Secondary | ICD-10-CM | POA: Diagnosis not present

## 2023-04-29 LAB — BASIC METABOLIC PANEL
Anion gap: 8 (ref 5–15)
BUN: 25 mg/dL — ABNORMAL HIGH (ref 8–23)
CO2: 25 mmol/L (ref 22–32)
Calcium: 8.3 mg/dL — ABNORMAL LOW (ref 8.9–10.3)
Chloride: 101 mmol/L (ref 98–111)
Creatinine, Ser: 1.1 mg/dL (ref 0.61–1.24)
GFR, Estimated: >60 mL/min (ref >60)
Glucose, Bld: 102 mg/dL — ABNORMAL HIGH (ref 70–99)
Potassium: 4 mmol/L (ref 3.5–5.1)
Sodium: 134 mmol/L — ABNORMAL LOW (ref 135–145)

## 2023-04-29 LAB — CBC
HCT: 51.7 % (ref 39.0–52.0)
Hemoglobin: 16.9 g/dL (ref 13.0–17.0)
MCH: 27.9 pg (ref 26.0–34.0)
MCHC: 32.7 g/dL (ref 30.0–36.0)
MCV: 85.3 fL (ref 80.0–100.0)
Platelets: 411 10*3/uL — ABNORMAL HIGH (ref 150–400)
RBC: 6.06 MIL/uL — ABNORMAL HIGH (ref 4.22–5.81)
RDW: 17.2 % — ABNORMAL HIGH (ref 11.5–15.5)
WBC: 17.1 10*3/uL — ABNORMAL HIGH (ref 4.0–10.5)
nRBC: 0 % (ref 0.0–0.2)

## 2023-04-29 MED ORDER — LABETALOL HCL 5 MG/ML IV SOLN
20.0000 mg | INTRAVENOUS | Status: DC | PRN
  Administered 2023-05-02: 20 mg via INTRAVENOUS
  Filled 2023-04-29: qty 4

## 2023-04-29 MED ORDER — SENNOSIDES-DOCUSATE SODIUM 8.6-50 MG PO TABS
1.0000 | ORAL_TABLET | Freq: Every day | ORAL | Status: DC
  Administered 2023-04-29 – 2023-04-30 (×2): 1 via ORAL
  Filled 2023-04-29 (×2): qty 1

## 2023-04-29 MED ORDER — HYDRALAZINE HCL 20 MG/ML IJ SOLN: 5.0000 mg | INTRAMUSCULAR | Status: DC | PRN

## 2023-04-29 MED ORDER — DOCUSATE SODIUM 100 MG PO CAPS
100.0000 mg | ORAL_CAPSULE | Freq: Two times a day (BID) | ORAL | Status: DC
  Administered 2023-04-29 – 2023-05-05 (×8): 100 mg via ORAL
  Filled 2023-04-29 (×9): qty 1

## 2023-04-29 MED ORDER — POLYETHYLENE GLYCOL 3350 17 G PO PACK
17.0000 g | PACK | Freq: Every day | ORAL | Status: DC
  Administered 2023-04-29 – 2023-04-30 (×2): 17 g via ORAL
  Filled 2023-04-29 (×2): qty 1

## 2023-04-29 NOTE — Progress Notes (Addendum)
STROKE TEAM PROGRESS NOTE   BRIEF HPI Mason [name] is a 77 y.o. male with a history of atrial fibrillation diagnosed about 4 years ago per patient who has been managed on Xarelto since that time.  He was in his normal state of health until 8 PM at which point he started noticing right-sided weakness.  Patient was not sure if he took his xarelto or not.    SIGNIFICANT HOSPITAL EVENTS 9/3: 3ml acute IPH, No I'VE 9/4: interval increase in hemorrhage size.    INTERIM HISTORY/SUBJECTIVE Patient is laying in the bed in NAD. Family is present  Neurological exam is unchanged.  WBC trending down to 17, afebrile.  Blood cultures NGTD.   Will start bowel regimen as patient with no BM since admission  Will transfer out of the ICU today. Will ask medical hospitalist to assume care tomorrow    OBJECTIVE  CBC    Component Value Date/Time   WBC 17.1 (H) 04/29/2023 0545   RBC 6.06 (H) 04/29/2023 0545   HGB 16.9 04/29/2023 0545   HCT 51.7 04/29/2023 0545   PLT 411 (H) 04/29/2023 0545   MCV 85.3 04/29/2023 0545   MCH 27.9 04/29/2023 0545   MCHC 32.7 04/29/2023 0545   RDW 17.2 (H) 04/29/2023 0545   LYMPHSABS 3.4 04/25/2023 2106   MONOABS 1.0 04/25/2023 2106   EOSABS 0.5 04/25/2023 2106   BASOSABS 0.2 (H) 04/25/2023 2106    BMET    Component Value Date/Time   NA 134 (L) 04/29/2023 0545   K 4.0 04/29/2023 0545   CL 101 04/29/2023 0545   CO2 25 04/29/2023 0545   GLUCOSE 102 (H) 04/29/2023 0545   BUN 25 (H) 04/29/2023 0545   CREATININE 1.10 04/29/2023 0545   CALCIUM 8.3 (L) 04/29/2023 0545   GFRNONAA >60 04/29/2023 0545    IMAGING past 24 hours No results found.  Vitals:   04/29/23 0400 04/29/23 0500 04/29/23 0600 04/29/23 0700  BP: (!) 158/103 131/88 (!) 155/89 127/75  Pulse: 65 (!) 59 65 62  Resp: 15 13 10 14   Temp: 97.8 F (36.6 C)     TempSrc: Oral     SpO2: 98% 95% 96% 97%  Weight:        PHYSICAL EXAM General:  Alert, well-nourished, well-developed elderly  Caucasian male t in no acute distress Psych:  Mood and affect appropriate for situation CV: Regular rate and rhythm on monitor Respiratory:  Regular, unlabored respirations on room air GI: Abdomen soft and nontender  NEURO:  Mental Status: Awake, alert, eyes spontaneously open, oriented to place. Speech/Language: is limited language output, dysarthric.  Poor attention.   Cranial Nerves:  II: PERRL. Visual fields full.  III, IV, VI: left gaze preference, but does cross midline and track.  Eyelids elevate symmetrically.  V: Sensation is intact to light touch and symmetrical to face.  VII: Right facial droop VIII: hearing intact to voice. IX, X: Palate elevates symmetrically. Phonation is normal.  ZO:XWRUEAVW shrug decreased on right.  XII: tongue is deviates to right  Motor: Right hemiplegia. Withdraws on right leg,  4+/5 LUE, 4/5 LLE. 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 CT repeat Interval increase  in size of hemorrhage, now measuring 2.4 x 1.9 x 3.4 cm. Intraventricular hemorrhage is now present with layering blood in the posterior horns of both lateral ventricles. No hydrocephalus CTA head and neck high-grade stenosis of the mid right P2 segment.  CT chest Nonocclusive filling defects in two segmental right upper lobe pulmonary arteries suspicious for acute pulmonary embolism.  There is a 6 mm oval nodule along the medial wall of the aortic. There is moderate narrowing of the proximal left subclavian artery secondary to low-density atherosclerotic plaque or mural thrombus arch. MRI Brain :  2.0 x 2.3 x 3.0 cm (estimated volume 7 mL) intraparenchymal hemorrhage at the left  thalamocapsular region, mildly increased in size as compared to prior CT from 04/25/2023. Surrounding vasogenic edema has mildly progressed. No underlying mass or other structural abnormality identified underlying the acute hemorrhage. 2D Echo EF 60-65% LDL 59 HgbA1c 6.0 UDS neg Bilateral Duplex negative  VTE prophylaxis - heparin SQ Xarelto (rivaroxaban) daily prior to admission, now on No antithrombotic due to IPH Therapy recommendations:  CIR Disposition:  pending  Atrial fibrillation Home Meds: Xarelto Xarelto reversed with Andexxa  Continue telemetry monitoring HR controlled Hold anticoagulation due to IPH  Aortic atherosclerosis  CTA neck showed 6 mm ulceration along the medial inferior aspect of the aortic arch, incompletely imaged. Recommend CT angio chest for further evaluation of the aorta. CTA chest Nonocclusive filling defects in two segmental right upper lobe pulmonary arteries suspicious for acute pulmonary embolism.  There is a 6 mm oval nodule along the medial wall of the aortic. There is moderate narrowing of the proximal left subclavian artery secondary to low-density atherosclerotic plaque or mural thrombus arch.  Hypertension Home meds:  none UnStable BP goal: SBP < 160 Cleviprex gtt off  norvasc 10mg   Continue  losartan 25mg   Labetalol and Hydralazine IVP PRNs as well.   Hyperlipidemia Home meds:  none LDL 82, goal < 70 Consider starting statin at discharge  Dysphagia Patient has post-stroke dysphagia, SLP consulted On dys 2 and honey thick liquid Advance diet as tolerated Aspiration precuations  Other Stroke Risk Factors Family hx stroke (mother) Former cigarette and cigar smoker   Other active problems Leukocytosis, WBC 14.8->19.6->23.4->20.4-17.1 afebrile Hyponatremia Na 134   Hospital day # 4   Pt seen by Neuro NP/APP and later by MD. Note/plan to be edited by MD as needed.    Gevena Mart DNP, ACNPC-AG  [facility]  I  have personally obtained history,examined this patient, reviewed notes, independently viewed imaging studies, participated in medical decision making and plan of care.ROS completed by me personally and pertinent positives fully documented  I have made any additions or clarifications directly to the above note. Agree with note above.  Patient neurological exam continues to show slight improvement.  Blood pressure adequately controlled and he is now off Cleviprex drip.  Continues to have dense hemiplegia.  Recommend mobilize out of bed.  Ongoing therapy consults.  Transfer to neurology floor bed.  Will asked medical hospitalist team to consult and resume care once he leaves ICU.  Long discussion with patient and family at the bedside and answered questions.This patient is critically ill and at significant risk of neurological worsening, death and care requires constant monitoring of vital signs, hemodynamics,respiratory and cardiac monitoring, extensive review of multiple databases, frequent neurological assessment, discussion with family, other specialists and medical decision making of high complexity.I have made any additions or clarifications directly to the above note.This critical care time does not  reflect procedure time, or teaching time or supervisory time of PA/NP/Med Resident etc but could involve care discussion time.  I spent 30 minutes of neurocritical care time  in the care of  this patient.     Delia Heady, MD Medical Director [facility] Pager: [phone] 04/29/2023 2:06 PM  To contact Stroke Continuity provider, please refer to [email or web address]. After hours, contact General Neurology

## 2023-04-29 NOTE — Progress Notes (Signed)
Inpatient Rehab Admissions:  Inpatient Rehab Consult received.  I met with pt and son at the bedside for rehabilitation assessment and to discuss goals and expectations of an inpatient rehab admission.  Discussed average length of stay, insurance authorization requirement, discharge home after completion of CIR. Both acknowledged understanding. Pt and son would like time to think about pursuing CIR. Asked AC to follow up on Monday. Will continue to follow.  Signed: Wolfgang Phoenix, MS, CCC-SLP Admissions Coordinator [phone]

## 2023-04-29 NOTE — Progress Notes (Addendum)
Admitted patient to Room 37 - 3 [place] from [address] ICU accompanied by patient's son. Patient is alert but disoriented x 4, unable to express words but able to follow simple commands. Safety precautions observed. Call bell placed within reach. Bed alarms on.   Skin assessed as witnessed by Amy RN. See Flowsheet.

## 2023-04-30 DIAGNOSIS — I610 Nontraumatic intracerebral hemorrhage in hemisphere, subcortical: Secondary | ICD-10-CM | POA: Diagnosis not present

## 2023-04-30 DIAGNOSIS — I619 Nontraumatic intracerebral hemorrhage, unspecified: Secondary | ICD-10-CM | POA: Diagnosis not present

## 2023-04-30 LAB — CBC
HCT: 53.2 % — ABNORMAL HIGH (ref 39.0–52.0)
Hemoglobin: 17.4 g/dL — ABNORMAL HIGH (ref 13.0–17.0)
MCH: 29 pg (ref 26.0–34.0)
MCHC: 32.7 g/dL (ref 30.0–36.0)
MCV: 88.7 fL (ref 80.0–100.0)
Platelets: 444 10*3/uL — ABNORMAL HIGH (ref 150–400)
RBC: 6 MIL/uL — ABNORMAL HIGH (ref 4.22–5.81)
RDW: 17.4 % — ABNORMAL HIGH (ref 11.5–15.5)
WBC: 16.6 10*3/uL — ABNORMAL HIGH (ref 4.0–10.5)
nRBC: 0 % (ref 0.0–0.2)

## 2023-04-30 LAB — BASIC METABOLIC PANEL
Anion gap: 7 (ref 5–15)
BUN: 28 mg/dL — ABNORMAL HIGH (ref 8–23)
CO2: 27 mmol/L (ref 22–32)
Calcium: 8.2 mg/dL — ABNORMAL LOW (ref 8.9–10.3)
Chloride: 100 mmol/L (ref 98–111)
Creatinine, Ser: 1.11 mg/dL (ref 0.61–1.24)
GFR, Estimated: >60 mL/min (ref >60)
Glucose, Bld: 128 mg/dL — ABNORMAL HIGH (ref 70–99)
Potassium: 3.6 mmol/L (ref 3.5–5.1)
Sodium: 134 mmol/L — ABNORMAL LOW (ref 135–145)

## 2023-04-30 MED ORDER — POLYETHYLENE GLYCOL 3350 17 G PO PACK
17.0000 g | PACK | Freq: Two times a day (BID) | ORAL | Status: DC
  Administered 2023-04-30 – 2023-05-05 (×3): 17 g via ORAL
  Filled 2023-04-30 (×4): qty 1

## 2023-04-30 MED ORDER — TRAMADOL HCL 50 MG PO TABS
50.0000 mg | ORAL_TABLET | Freq: Once | ORAL | Status: AC
  Administered 2023-04-30: 50 mg via ORAL
  Filled 2023-04-30: qty 1

## 2023-04-30 NOTE — Plan of Care (Signed)
  Problem: Education: Goal: Knowledge of disease or condition will improve Outcome: Progressing Goal: Knowledge of secondary prevention will improve (MUST DOCUMENT ALL) Outcome: Progressing Goal: Knowledge of patient specific risk factors will improve Loraine Leriche N/A or DELETE if not current risk factor) Outcome: Progressing   Problem: Intracerebral Hemorrhage Tissue Perfusion: Goal: Complications of Intracerebral Hemorrhage will be minimized Outcome: Progressing   Problem: Coping: Goal: Will verbalize positive feelings about self Outcome: Progressing   Problem: Health Behavior/Discharge Planning: Goal: Ability to manage health-related needs will improve Outcome: Progressing   Problem: Nutrition: Goal: Risk of aspiration will decrease Outcome: Progressing Goal: Dietary intake will improve Outcome: Progressing

## 2023-04-30 NOTE — Progress Notes (Signed)
Progress Note   Patient: NAAZIR FORSHAY EXB:[id number] DOB: May 24, 1946 DOA: 04/25/2023     5 DOS: the patient was seen and examined on 04/30/2023   Brief hospital course: 76yo with hx afib on xarelto who presented with acute IPH, was on stroke service. As of 9/8, pt was deemed stable from Neuro service with transfer to [facility] for placement to rehab with Neurology signing off.    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 Deemed stable for CIR at this time. Neuro recs to f/u as outpatient in 8 weeks after d/c  HTN -BP stable at this time -Neuro rec to keep sbp <160 -cont norvasc 10mg , losartan 25mg   Afib -currently rate controlled -not on any rate controlling med at this time -off anticoagulation per above  HLD -Neuro recs for statin at time of d/c  Dysphagia -on dysphagia 2 with honey thick liquids -cont to advance diet as tolerated  Constipation -No significant stool output despite multiple cathartics -Will schedule miralax to BID dosing -May need enema if no results  Bladder outlet obstruction -continue I/o cath for >300cc on bladder scan     Subjective: Remains constipated. Eager to start rehab  Physical Exam: Vitals:   04/30/23 0412 04/30/23 0817 04/30/23 1143 04/30/23 1619  BP: (!) 145/90 (!) 145/80 (!) 142/85 (!) 143/88  Pulse: 69 66 (!) 59 69  Resp: 18 18 18 20   Temp: 97.6 F (36.4 C) 98 F (36.7 C) 97.9 F (36.6 C) 99.9 F (37.7 C)  TempSrc: Oral  Oral Oral  SpO2: 94% 97% 95% 95%  Weight:       General exam: Conversant, in no acute distress Respiratory system: normal chest rise, clear, no audible wheezing Cardiovascular system: regular rhythm, s1-s2 Gastrointestinal system: Nondistended, nontender, pos BS Central nervous system: No seizures, no tremors Extremities: No  cyanosis, no joint deformities Skin: No rashes, no pallor Psychiatry: Affect normal // no auditory hallucinations   Data Reviewed:  Labs reviewed: Na 134, K 3.6, Cr 1.11, WBC 16.6, Hgb 17.4, Plts 444  Family Communication: Pt in room, family at bedside  Disposition: Status is: Inpatient Remains inpatient appropriate because: severity of illness  Planned Discharge Destination: Rehab    Author: Rickey Barbara, MD 04/30/2023 5:28 PM  For on call review [email or web address].

## 2023-04-30 NOTE — Hospital Course (Signed)
76yo with hx afib on xarelto who presented with acute IPH, was on stroke service. As of 9/8, pt was deemed stable from Neuro service with transfer to [facility] for placement to rehab with Neurology signing off.

## 2023-04-30 NOTE — Progress Notes (Addendum)
STROKE TEAM PROGRESS NOTE   BRIEF HPI Mason [name] is a 77 y.o. male with a history of atrial fibrillation diagnosed about 4 years ago per patient who has been managed on Xarelto since that time.  He was in his normal state of health until 8 PM at which point he started noticing right-sided weakness.  Patient was not sure if he took his xarelto or not.    SIGNIFICANT HOSPITAL EVENTS 9/3: 3ml acute IPH, No I'VE 9/4: interval increase in hemorrhage size.    INTERIM HISTORY/SUBJECTIVE Patient is laying in the bed in NAD. He is awake and alert following commands  Family is present  No new neurological events overnight  Neurological exam is unchanged.     OBJECTIVE  CBC    Component Value Date/Time   WBC 16.6 (H) 04/30/2023 0946   RBC 6.00 (H) 04/30/2023 0946   HGB 17.4 (H) 04/30/2023 0946   HCT 53.2 (H) 04/30/2023 0946   PLT 444 (H) 04/30/2023 0946   MCV 88.7 04/30/2023 0946   MCH 29.0 04/30/2023 0946   MCHC 32.7 04/30/2023 0946   RDW 17.4 (H) 04/30/2023 0946   LYMPHSABS 3.4 04/25/2023 2106   MONOABS 1.0 04/25/2023 2106   EOSABS 0.5 04/25/2023 2106   BASOSABS 0.2 (H) 04/25/2023 2106    BMET    Component Value Date/Time   NA 134 (L) 04/30/2023 0946   K 3.6 04/30/2023 0946   CL 100 04/30/2023 0946   CO2 27 04/30/2023 0946   GLUCOSE 128 (H) 04/30/2023 0946   BUN 28 (H) 04/30/2023 0946   CREATININE 1.11 04/30/2023 0946   CALCIUM 8.2 (L) 04/30/2023 0946   GFRNONAA >60 04/30/2023 0946    IMAGING past 24 hours No results found.  Vitals:   04/30/23 0042 04/30/23 0412 04/30/23 0817 04/30/23 1143  BP: 128/74 (!) 145/90 (!) 145/80 (!) 142/85  Pulse: 79 69 66 (!) 59  Resp: 20 18 18 18   Temp: 98.4 F (36.9 C) 97.6 F (36.4 C) 98 F (36.7 C) 97.9 F (36.6 C)  TempSrc: Axillary Oral  Oral  SpO2: 96% 94% 97% 95%  Weight:        PHYSICAL EXAM General:  Alert, well-nourished, well-developed elderly Caucasian male t in no acute distress Psych:  Mood and affect  appropriate for situation CV: Regular rate and rhythm on monitor Respiratory:  Regular, unlabored respirations on room air GI: Abdomen soft and nontender  NEURO:  Mental Status: Awake, alert, eyes spontaneously open, oriented to place. Speech/Language: is limited language output, dysarthric.  Poor attention.   Cranial Nerves:  II: PERRL. Visual fields full.  III, IV, VI: left gaze preference, but does cross midline and track.  Eyelids elevate symmetrically.  V: Sensation is intact to light touch and symmetrical to face.  VII: Right facial droop VIII: hearing intact to voice. IX, X: Palate elevates symmetrically. Phonation is normal.  QI:[id number] shrug decreased on right.  XII: tongue is deviates to right  Motor: Right hemiplegia. Withdraws on right leg,  4+/5 LUE, 4/5 LLE. 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 CT repeat Interval increase in size of hemorrhage, now measuring 2.4 x 1.9 x 3.4 cm. Intraventricular  hemorrhage is now present with layering blood in the posterior horns of both lateral ventricles. No hydrocephalus CTA head and neck high-grade stenosis of the mid right P2 segment.  CT chest Nonocclusive filling defects in two segmental right upper lobe pulmonary arteries suspicious for acute pulmonary embolism.  There is a 6 mm oval nodule along the medial wall of the aortic. There is moderate narrowing of the proximal left subclavian artery secondary to low-density atherosclerotic plaque or mural thrombus arch. MRI Brain :  2.0 x 2.3 x 3.0 cm (estimated volume 7 mL) intraparenchymal hemorrhage at the left thalamocapsular region, mildly increased in size as compared to  prior CT from 04/25/2023. Surrounding vasogenic edema has mildly progressed. No underlying mass or other structural abnormality identified underlying the acute hemorrhage. 2D Echo EF 60-65% LDL 59 HgbA1c 6.0 UDS neg Bilateral Duplex negative  VTE prophylaxis - heparin SQ Xarelto (rivaroxaban) daily prior to admission, now on No antithrombotic due to IPH Will need outpatient follow up with neurology in 8 weeks after discharge  Therapy recommendations:  CIR Disposition:  pending  Atrial fibrillation Home Meds: Xarelto Xarelto reversed with Andexxa  Continue telemetry monitoring HR controlled Hold anticoagulation due to IPH  Aortic atherosclerosis  CTA neck showed 6 mm ulceration along the medial inferior aspect of the aortic arch, incompletely imaged. Recommend CT angio chest for further evaluation of the aorta. CTA chest Nonocclusive filling defects in two segmental right upper lobe pulmonary arteries suspicious for acute pulmonary embolism.  There is a 6 mm oval nodule along the medial wall of the aortic. There is moderate narrowing of the proximal left subclavian artery secondary to low-density atherosclerotic plaque or mural thrombus arch.  Hypertension Home meds:  none UnStable BP goal: SBP < 160 Cleviprex gtt off  norvasc 10mg   Continue  losartan 25mg   Labetalol and Hydralazine IVP PRNs as well.   Hyperlipidemia Home meds:  none LDL 82, goal < 70 Consider starting statin at discharge  Dysphagia Patient has post-stroke dysphagia, SLP consulted On dys 2 and honey thick liquid Advance diet as tolerated Aspiration precuations  Other Stroke Risk Factors Family hx stroke (mother) Former cigarette and cigar smoker   Other active problems Leukocytosis, WBC 14.8->19.6->23.4->20.4-17.1 afebrile Hyponatremia Na 134  Neurology will sign off. Please call with questions or concerns  Hospital day # 5   Pt seen by Neuro NP/APP and later by MD. Note/plan to be edited by  MD as needed.    Gevena Mart DNP, ACNPC-AG  [facility]  I have personally obtained history,examined this patient, reviewed notes, independently viewed imaging studies, participated in medical decision making and plan of care.ROS completed by me personally and pertinent positives fully documented  I have made any additions or clarifications directly to the above note. Agree with note above.  Patient neurological exam remains stable.  Await transfer to inpatient rehab when bed available.  Follow-up as an outpatient stroke clinic in 2 months.  Stroke team will sign off.   Kindly call for questions.  Delia Heady, MD Medical Director [facility] Pager: [phone] 04/30/2023 3:15 PM  To contact Stroke Continuity provider, please refer to [email or web address]. After hours, contact General Neurology

## 2023-04-30 NOTE — Plan of Care (Signed)
  Problem: Education: Goal: Knowledge of disease or condition will improve 04/30/2023 0654 by Therese Sarah, RN Outcome: Progressing 04/29/2023 2310 by Therese Sarah, RN Outcome: Progressing Goal: Knowledge of secondary prevention will improve (MUST DOCUMENT ALL) 04/30/2023 0654 by Therese Sarah, RN Outcome: Progressing 04/29/2023 2310 by Therese Sarah, RN Outcome: Progressing Goal: Knowledge of patient specific risk factors will improve Loraine Leriche N/A or DELETE if not current risk factor) 04/30/2023 0654 by Therese Sarah, RN Outcome: Progressing 04/29/2023 2310 by Therese Sarah, RN Outcome: Progressing

## 2023-05-01 DIAGNOSIS — I619 Nontraumatic intracerebral hemorrhage, unspecified: Secondary | ICD-10-CM | POA: Diagnosis not present

## 2023-05-01 LAB — COMPREHENSIVE METABOLIC PANEL
ALT: 51 U/L — ABNORMAL HIGH (ref 0–44)
AST: 30 U/L (ref 15–41)
Albumin: 2.9 g/dL — ABNORMAL LOW (ref 3.5–5.0)
Alkaline Phosphatase: 77 U/L (ref 38–126)
Anion gap: 8 (ref 5–15)
BUN: 25 mg/dL — ABNORMAL HIGH (ref 8–23)
CO2: 24 mmol/L (ref 22–32)
Calcium: 8.2 mg/dL — ABNORMAL LOW (ref 8.9–10.3)
Chloride: 101 mmol/L (ref 98–111)
Creatinine, Ser: 0.92 mg/dL (ref 0.61–1.24)
GFR, Estimated: >60 mL/min (ref >60)
Glucose, Bld: 124 mg/dL — ABNORMAL HIGH (ref 70–99)
Potassium: 4 mmol/L (ref 3.5–5.1)
Sodium: 133 mmol/L — ABNORMAL LOW (ref 135–145)
Total Bilirubin: 0.9 mg/dL (ref 0.3–1.2)
Total Protein: 6.7 g/dL (ref 6.5–8.1)

## 2023-05-01 LAB — CBC
HCT: 53.4 % — ABNORMAL HIGH (ref 39.0–52.0)
Hemoglobin: 17.4 g/dL — ABNORMAL HIGH (ref 13.0–17.0)
MCH: 27.8 pg (ref 26.0–34.0)
MCHC: 32.6 g/dL (ref 30.0–36.0)
MCV: 85.2 fL (ref 80.0–100.0)
Platelets: 512 10*3/uL — ABNORMAL HIGH (ref 150–400)
RBC: 6.27 MIL/uL — ABNORMAL HIGH (ref 4.22–5.81)
RDW: 17.2 % — ABNORMAL HIGH (ref 11.5–15.5)
WBC: 21.9 10*3/uL — ABNORMAL HIGH (ref 4.0–10.5)
nRBC: 0 % (ref 0.0–0.2)

## 2023-05-01 MED ORDER — SORBITOL 70 % SOLN
960.0000 mL | TOPICAL_OIL | Freq: Once | ORAL | Status: AC
  Administered 2023-05-01: 960 mL via RECTAL
  Filled 2023-05-01: qty 240

## 2023-05-01 MED ORDER — FLUTICASONE PROPIONATE 50 MCG/ACT NA SUSP
1.0000 | Freq: Every day | NASAL | Status: DC
  Administered 2023-05-01 – 2023-05-05 (×5): 1 via NASAL
  Filled 2023-05-01: qty 16

## 2023-05-01 NOTE — Progress Notes (Signed)
Physical Therapy Treatment Patient Details Name: Mason [name] MRN: [id number] DOB: 1946-03-02 Today's Date: 05/01/2023   History of Present Illness Pt is a 77 y.o. male who presented on 04/25/23 with right sided weakness and difficulty speaking. CT head with L subcortical hemorrhage in setting of Xarelto use.   Significant PMH includes diverticulitius, GERD, A-fib.    PT Comments  Pt seen for PT evaluation with co-tx with OT for pt & therapists' safety & to maximize mobility. Pt requires max assist +2 for bed mobility, max assist +2 for STS from EOB. Pt is making slow but steady progress with mobility. Pt remains good candidate for post acute rehab >3 hours therapy/day to maximize independence with mobility, decrease caregiver burden, & reduce fall risk.   If plan is discharge home, recommend the following: Two people to help with walking and/or transfers;Two people to help with bathing/dressing/bathroom   Can travel by private vehicle        Equipment Recommendations  None recommended by PT (defer to next venue)    Recommendations for Other Services Rehab consult     Precautions / Restrictions Precautions Precautions: Fall Precaution Comments: R hemiparesis Restrictions Weight Bearing Restrictions: No     Mobility  Bed Mobility Overal bed mobility: Needs Assistance Bed Mobility: Supine to Sit, Sit to Supine     Supine to sit: Max assist, +2 for physical assistance Sit to supine: Max assist, +2 for physical assistance   General bed mobility comments: Pt is able to participate in scooting to [facility] with bed in trendelenburg position, use of bed rails with LUE/LLE.    Transfers Overall transfer level: Needs assistance Equipment used: 2 person hand held assist Transfers: Sit to/from Stand Sit to Stand: Max assist, +2 physical assistance, From elevated surface           General transfer comment: Pt transferred STS from elevated EOB with 3 muskateers then HHA with +2  with cuing for upright posture & anterior pelvic shift but poor return demo, but pt does weight bear through BLE, activation noted in RLE. Pt unable to hold head upright which causes further trunk flexion & lean. Pt did note slight dizziness after 1st standing attempt but given seated rest break EOB.    Ambulation/Gait                   Stairs             Wheelchair Mobility     Tilt Bed    Modified Rankin (Stroke Patients Only)       Balance Overall balance assessment: Needs assistance Sitting-balance support: No upper extremity supported, Feet supported, Single extremity supported Sitting balance-Leahy Scale: Poor     Standing balance support: Bilateral upper extremity supported Standing balance-Leahy Scale: Zero                              Cognition Arousal: Alert   Overall Cognitive Status: Impaired/Different from baseline Area of Impairment: Orientation, Attention, Memory, Following commands, Safety/judgement, Awareness, Problem solving                 Orientation Level: Disoriented to, Time, Place   Memory: Decreased recall of precautions, Decreased short-term memory Following Commands: Follows one step commands with increased time, Follows one step commands inconsistently Safety/Judgement: Decreased awareness of safety, Decreased awareness of deficits Awareness: Intellectual Problem Solving: Slow processing, Decreased initiation, Difficulty sequencing, Requires verbal cues, Requires tactile cues  Exercises Other Exercises Other Exercises: Pt requires cuing throughout session to extend cervical spine to neutral vs significant forward flexion, pt able to return demonstrate but unable to sustain. Other Exercises: Pt performed R lateral leans then returning to midline x 3 reps for increased weight bearing in RUE, core/trunk strengthening, & balance training. Other Exercises: Pt engaged in reaching outside of BOS with LUE  then returning to midline.    General Comments        Pertinent Vitals/Pain Pain Assessment Pain Assessment: Faces Faces Pain Scale: Hurts a little bit Pain Location: generalized, stomach Pain Descriptors / Indicators: Discomfort Pain Intervention(s): Monitored during session    Home Living                          Prior Function            PT Goals (current goals can now be found in the care plan section) Acute Rehab PT Goals Patient Stated Goal: unable to state PT Goal Formulation: With patient Time For Goal Achievement: 05/10/23 Potential to Achieve Goals: Fair Additional Goals Additional Goal #1: Will increase R LE strength to 2/5 to assist with transfers Progress towards PT goals: Progressing toward goals    Frequency    Min 1X/week      PT Plan      Co-evaluation PT/OT/SLP Co-Evaluation/Treatment: Yes Reason for Co-Treatment: Complexity of the patient's impairments (multi-system involvement);To address functional/ADL transfers;Necessary to address cognition/behavior during functional activity;For patient/therapist safety PT goals addressed during session: Mobility/safety with mobility;Balance        AM-PAC PT "6 Clicks" Mobility   Outcome Measure  Help needed turning from your back to your side while in a flat bed without using bedrails?: A Lot Help needed moving from lying on your back to sitting on the side of a flat bed without using bedrails?: Total Help needed moving to and from a bed to a chair (including a wheelchair)?: Total Help needed standing up from a chair using your arms (e.g., wheelchair or bedside chair)?: Total Help needed to walk in hospital room?: Total Help needed climbing 3-5 steps with a railing? : Total 6 Click Score: 7    End of Session Equipment Utilized During Treatment: Gait belt Activity Tolerance: Patient tolerated treatment well Patient left: in bed;with call bell/phone within reach;with bed alarm set;with  family/visitor present Nurse Communication: Mobility status PT Visit Diagnosis: Other abnormalities of gait and mobility (R26.89);Muscle weakness (generalized) (M62.81);Hemiplegia and hemiparesis Hemiplegia - Right/Left: Right Hemiplegia - dominant/non-dominant: Dominant Hemiplegia - caused by: Nontraumatic intracerebral hemorrhage     Time: 1211-1238 PT Time Calculation (min) (ACUTE ONLY): 27 min  Charges:    $Neuromuscular Re-education: 8-22 mins PT General Charges $$ ACUTE PT VISIT: 1 Visit                     Aleda Grana, PT, DPT 05/01/23, 1:00 PM   Sandi Mariscal 05/01/2023, 12:58 PM

## 2023-05-01 NOTE — Progress Notes (Signed)
SLP Cancellation Note  Patient Details Name: SHAQVILLE GASQUE MRN: [id number] DOB: 11-Nov-1945   Cancelled treatment:       Reason Eval/Treat Not Completed: Medical issues which prohibited therapy. Upon SLP arrival, pt with bleeding around catheter site. RN notified. Provided educational documents to pt's daughter. Will continue to follow.    Gwynneth Aliment, M.A., CF-SLP Speech Language Pathology, Acute Rehabilitation Services  Secure Chat preferred [phone]  05/01/2023, 5:03 PM

## 2023-05-01 NOTE — Care Management Important Message (Signed)
Important Message  Patient Details  Name: Mason [name] MRN: [id number] Date of Birth: 1946-01-09   Medicare Important Message Given:  Yes     Marlinda Miranda 05/01/2023, 3:02 PM

## 2023-05-01 NOTE — Progress Notes (Signed)
Occupational Therapy Treatment Patient Details Name: Mason [name] MRN: [id number] DOB: November 27, 1945 Today's Date: 05/01/2023   History of present illness Pt is a 77 y.o. male who presented on 04/25/23 with right sided weakness and difficulty speaking. CT head with L subcortical hemorrhage in setting of Xarelto use.   Significant PMH includes diverticulitius, GERD, A-fib.   OT comments  Pt seen in conjunction with PT to maximize pts activity tolerance and optimize pt participation. Pt continues to present with impaired balance, R sided weakness, decreased activity tolerance and generalized deconditioning. Pt currently requires MAX A +2 for all aspects of bed mobility. Pt requires MAX A for static sitting balance d/t R lateral/posterior lean. Pt was able to stand today with MAX A +2.  Worked on Press photographer to RUE with pt completing lateral leans into R forearm with MAX  +2. Pt continues to present with R inattention however provided education on importance of attending to RUE for neuromuscular reeducation. Pt would continue to benefit from skilled occupational therapy while admitted and after d/c to address the below listed limitations in order to improve overall functional mobility and facilitate independence with BADL participation. DC plan remains appropriate, will follow acutely per POC.          If plan is discharge home, recommend the following:  Two people to help with walking and/or transfers;Two people to help with bathing/dressing/bathroom;Assistance with cooking/housework;Assistance with feeding;Direct supervision/assist for medications management;Direct supervision/assist for financial management;Assist for transportation;Help with stairs or ramp for entrance   Equipment Recommendations  Other (comment) (defer)    Recommendations for Other Services      Precautions / Restrictions Precautions Precautions: Fall Precaution Comments: R hemiparesis Restrictions Weight  Bearing Restrictions: No       Mobility Bed Mobility Overal bed mobility: Needs Assistance Bed Mobility: Supine to Sit, Sit to Supine     Supine to sit: Max assist, +2 for physical assistance Sit to supine: Max assist, +2 for physical assistance   General bed mobility comments: Pt is able to participate in scooting to [facility] with bed in trendelenburg position, use of bed rails with LUE/LLE.    Transfers Overall transfer level: Needs assistance Equipment used: 2 person hand held assist Transfers: Sit to/from Stand Sit to Stand: Max assist, +2 physical assistance, From elevated surface           General transfer comment: Pt transferred STS from elevated EOB with 3 muskateers then HHA with +2 with cuing for upright posture & anterior pelvic shift but poor return demo, but pt does weight bear through BLE, activation noted in RLE. Pt unable to hold head upright which causes further trunk flexion & lean.     Balance Overall balance assessment: Needs assistance Sitting-balance support: No upper extremity supported, Feet supported, Single extremity supported Sitting balance-Leahy Scale: Poor Sitting balance - Comments: pt required up to MAX A for sitting balance d/t R lean and posterior lean Postural control: Posterior lean, Right lateral lean Standing balance support: Bilateral upper extremity supported Standing balance-Leahy Scale: Zero                             ADL either performed or assessed with clinical judgement   ADL Overall ADL's : Needs assistance/impaired                             Toileting- Clothing Manipulation and Hygiene: Total assistance;+2  for physical assistance;Sit to/from stand Toileting - Clothing Manipulation Details (indicate cue type and reason): NT completed posterior pericare d/t incontinent BM while therapists stood with pt     Functional mobility during ADLs: Maximal assistance;+2 for physical assistance;+2 for  safety/equipment;Total assistance (sit<>stand only from EOB) General ADL Comments: ADL participation impacted by imparied balance, R sided weakness, impaired posture, and decreased activity tolerance    Extremity/Trunk Assessment Upper Extremity Assessment Upper Extremity Assessment: RUE deficits/detail RUE Deficits / Details: flaccid RUE Sensation: decreased light touch;decreased proprioception RUE Coordination: decreased fine motor;decreased gross motor   Lower Extremity Assessment Lower Extremity Assessment: Defer to PT evaluation   Cervical / Trunk Assessment Cervical / Trunk Assessment: Other exceptions Cervical / Trunk Exceptions: Posture normal; poor trunk control/strength/balance, poor head control    Vision Baseline Vision/History: 0 No visual deficits Patient Visual Report: No change from baseline Vision Assessment?: Vision impaired- to be further tested in functional context Additional Comments: needs further assessment, R inattention noted   Perception Perception Perception: Impaired Preception Impairment Details: Inattention/Neglect   Praxis Praxis Praxis: Not tested    Cognition Arousal: Alert Behavior During Therapy: Flat affect Overall Cognitive Status: Impaired/Different from baseline Area of Impairment: Orientation, Attention, Memory, Following commands, Safety/judgement, Awareness, Problem solving                 Orientation Level: Disoriented to, Time, Place Current Attention Level: Sustained Memory: Decreased recall of precautions, Decreased short-term memory Following Commands: Follows one step commands with increased time, Follows one step commands inconsistently Safety/Judgement: Decreased awareness of safety, Decreased awareness of deficits Awareness: Intellectual Problem Solving: Slow processing, Decreased initiation, Difficulty sequencing, Requires verbal cues, Requires tactile cues          Exercises      Shoulder Instructions        General Comments      Pertinent Vitals/ Pain       Pain Assessment Pain Assessment: Faces Faces Pain Scale: Hurts a little bit Pain Location: generalized, stomach Pain Descriptors / Indicators: Discomfort Pain Intervention(s): Monitored during session  Home Living                                          Prior Functioning/Environment              Frequency  Min 1X/week        Progress Toward Goals  OT Goals(current goals can now be found in the care plan section)  Progress towards OT goals: Progressing toward goals  Acute Rehab OT Goals Patient Stated Goal: to have a BM Time For Goal Achievement: 05/10/23 Potential to Achieve Goals: Fair  Plan      Co-evaluation      Reason for Co-Treatment: Complexity of the patient's impairments (multi-system involvement);To address functional/ADL transfers;Necessary to address cognition/behavior during functional activity;For patient/therapist safety PT goals addressed during session: Mobility/safety with mobility;Balance OT goals addressed during session: ADL's and self-care;Strengthening/ROM      AM-PAC OT "6 Clicks" Daily Activity     Outcome Measure   Help from another person eating meals?: A Lot Help from another person taking care of personal grooming?: A Lot Help from another person toileting, which includes using toliet, bedpan, or urinal?: Total Help from another person bathing (including washing, rinsing, drying)?: A Lot Help from another person to put on and taking off regular upper body clothing?: A Lot Help from  another person to put on and taking off regular lower body clothing?: Total 6 Click Score: 10    End of Session Equipment Utilized During Treatment: Gait belt  OT Visit Diagnosis: Other abnormalities of gait and mobility (R26.89);Hemiplegia and hemiparesis;Cognitive communication deficit (R41.841) Hemiplegia - Right/Left: Right Hemiplegia - dominant/non-dominant:  Dominant Hemiplegia - caused by: Nontraumatic SAH   Activity Tolerance Patient tolerated treatment well   Patient Left in bed;with call bell/phone within reach;with bed alarm set;with family/visitor present   Nurse Communication Mobility status;Need for lift equipment        Time: 1211-1238 OT Time Calculation (min): 27 min  Charges: OT General Charges $OT Visit: 1 Visit OT Treatments $Therapeutic Activity: 8-22 mins Lenor Derrick., COTA/L Acute Rehabilitation Services [phone]   Barron Schmid 05/01/2023, 3:43 PM

## 2023-05-01 NOTE — Plan of Care (Signed)
  Problem: Coping: Goal: Will verbalize positive feelings about self Outcome: Progressing   Problem: Self-Care: Goal: Ability to participate in self-care as condition permits will improve Outcome: Progressing   Problem: Nutrition: Goal: Risk of aspiration will decrease Outcome: Progressing   Problem: Education: Goal: Knowledge of General Education information will improve Description: Including pain rating scale, medication(s)/side effects and non-pharmacologic comfort measures Outcome: Progressing   Problem: Activity: Goal: Risk for activity intolerance will decrease Outcome: Progressing   Problem: Elimination: Goal: Will not experience complications related to bowel motility Outcome: Progressing Goal: Will not experience complications related to urinary retention Outcome: Progressing   Problem: Pain Managment: Goal: General experience of comfort will improve Outcome: Progressing

## 2023-05-01 NOTE — Progress Notes (Signed)
Progress Note   Patient: Mason [name] YQM:[id number] DOB: May 17, 1946 DOA: 04/25/2023     6 DOS: the patient was seen and examined on 05/01/2023   Brief hospital course: 76yo with hx afib on xarelto who presented with acute IPH, was on stroke service. As of 9/8, pt was deemed stable from Neuro service with transfer to [facility] for placement to rehab with Neurology signing off.    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 Deemed stable for CIR at this time. Neuro recs to f/u as outpatient in 8 weeks after d/c  HTN -BP stable at this time -Neuro rec to keep sbp <160 -cont norvasc 10mg , losartan 25mg   Afib -currently rate controlled -not on any rate controlling med at this time -off anticoagulation per above  HLD -Neuro recs for statin at time of d/c  Dysphagia -on dysphagia 2 with honey thick liquids -cont to advance diet as tolerated  Constipation -No significant stool output despite multiple cathartics -Still no results despite miralax and soap suds -Will give trial of SMOG  Bladder outlet obstruction -continue I/o cath for >300cc on bladder scan -Would place indwelling cath if patient continues to retain >24h     Subjective: still constipated. Small bm with enema yesterday  Physical Exam: Vitals:   04/30/23 2315 05/01/23 0351 05/01/23 0729 05/01/23 1156  BP: (!) 152/94 (!) 157/90 (!) 155/89 (!) 154/95  Pulse: 76 82 65 70  Resp: 18 16 18 18   Temp: 98.4 F (36.9 C) 98.7 F (37.1 C) 97.9 F (36.6 C) 98.3 F (36.8 C)  TempSrc: Oral Oral  Oral  SpO2: 97% 97% 96% 96%  Weight:       General exam: Awake, laying in bed, in nad Respiratory system: Normal respiratory effort, no wheezing Cardiovascular system: regular rate, s1, s2 Gastrointestinal system: Soft, nondistended, positive  BS Central nervous system: CN2-12 grossly intact, strength intact Extremities: Perfused, no clubbing Skin: Normal skin turgor, no notable skin lesions seen Psychiatry: Mood normal // no visual hallucinations   Data Reviewed:  Labs reviewed: Na 133, K 4.0, Cr 0.92, WBC 21.9, Hgb 17.4  Family Communication: Pt in room, family at bedside  Disposition: Status is: Inpatient Remains inpatient appropriate because: severity of illness  Planned Discharge Destination: Rehab    Author: Rickey Barbara, MD 05/01/2023 3:49 PM  For on call review [email or web address].

## 2023-05-01 NOTE — [place] FL2 (Signed)
[place] MEDICAID FL2 LEVEL OF CARE FORM     IDENTIFICATION  Patient Name: Mason [name] Birthdate: 06/01/1946 Sex: male Admission Date (Current Location): 04/25/2023  [facility] and [place] Number:  Producer, television/film/video and Address:  [facility] [place], [address] [place], [place] [address]      Provider Number: [id number]  Attending Physician Name and Address:  Jerald Kief, MD  Relative Name and Phone Number:       Current Level of Care: Hospital Recommended Level of Care: Skilled Nursing Facility Prior Approval Number:    Date Approved/Denied:   PASRR Number: [id number]  Discharge Plan: SNF    Current Diagnoses: Patient Active Problem List   Diagnosis Date Noted   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   Back pain 04/16/2023   Balance problem 07/03/2022   Abnormal CBC 11/24/2021   Neck pain 03/31/2021   Atrial fibrillation (HCC) 09/29/2019   Diverticulitis 09/18/2019   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/18/2019   Degenerative disc disease, cervical 01/23/2019   Skin irritation 10/28/2018   Health care maintenance 06/25/2017   Low HDL (under 40) 05/24/2016   Advance care planning 05/02/2014   PSA elevation 05/02/2014   BPH (benign prostatic hyperplasia) 12/10/2013   Erectile dysfunction 08/26/2013   GERD (gastroesophageal reflux disease) 06/28/2013   Postnasal drip 03/26/2012   Medicare annual wellness visit, subsequent 11/04/2011   Depression 04/01/2011   ANXIETY 12/15/2007   PEPTIC STRICTURE 12/15/2007   IRRITABLE BOWEL SYNDROME 12/23/2006    Orientation RESPIRATION BLADDER Height & Weight     Self, Place  Normal Incontinent Weight: 180 lb 5.4 oz (81.8 kg) Height:     BEHAVIORAL SYMPTOMS/MOOD NEUROLOGICAL BOWEL NUTRITION STATUS      Incontinent Diet (see DC summary)  AMBULATORY STATUS COMMUNICATION OF NEEDS Skin   Extensive Assist Verbally Normal                       Personal Care Assistance Level of Assistance  Bathing,  Feeding, Dressing Bathing Assistance: Maximum assistance Feeding assistance: Limited assistance Dressing Assistance: Maximum assistance     Functional Limitations Info  Speech     Speech Info: Impaired (dysarthria)    SPECIAL CARE FACTORS FREQUENCY  PT (By licensed PT), OT (By licensed OT), Speech therapy     PT Frequency: 5x/wk OT Frequency: 5x/wk     Speech Therapy Frequency: 5x/wk      Contractures Contractures Info: Not present    Additional Factors Info  Code Status, Allergies Code Status Info: Full Allergies Info: Shellfish Allergy, Ak-mycin (Erythromycin), Celexa (Citalopram), Levsin (Hyoscyamine), Librax (Chlordiazepoxide-clidinium), Mobic (Meloxicam), Penicillins           Current Medications (05/01/2023):  This is the current hospital active medication list Current Facility-Administered Medications  Medication Dose Route Frequency Provider Last Rate Last Admin   acetaminophen (TYLENOL) tablet 650 mg  650 mg Oral Q4H PRN Rejeana Brock, MD   650 mg at 05/01/23 5621   Or   acetaminophen (TYLENOL) 160 MG/5ML solution 650 mg  650 mg Per Tube Q4H PRN Rejeana Brock, MD       Or   acetaminophen (TYLENOL) suppository 650 mg  650 mg Rectal Q4H PRN Rejeana Brock, MD       amLODipine (NORVASC) tablet 10 mg  10 mg Oral Daily Gevena Mart A, NP   10 mg at 05/01/23 0943   Chlorhexidine Gluconate Cloth 2 % PADS 6 each  6 each Topical  Daily Rejeana Brock, MD   6 each at 05/01/23 7829   docusate sodium (COLACE) capsule 100 mg  100 mg Oral BID Gevena Mart A, NP   100 mg at 05/01/23 0947   heparin injection 5,000 Units  5,000 Units Subcutaneous Q8H Micki Riley, MD   5,000 Units at 05/01/23 5621   hydrALAZINE (APRESOLINE) injection 5 mg  5 mg Intravenous Q4H PRN Micki Riley, MD       labetalol (NORMODYNE) injection 20 mg  20 mg Intravenous Q2H PRN Micki Riley, MD       latanoprost (XALATAN) 0.005 % ophthalmic solution 1 drop  1 drop  Both Eyes QHS Rejeana Brock, MD   1 drop at 04/30/23 2118   losartan (COZAAR) tablet 25 mg  25 mg Oral Daily Micki Riley, MD   25 mg at 05/01/23 0943   pantoprazole (PROTONIX) EC tablet 40 mg  40 mg Oral QHS Francena Hanly, RPH   40 mg at 04/30/23 2116   polyethylene glycol (MIRALAX / GLYCOLAX) packet 17 g  17 g Oral BID Jerald Kief, MD   17 g at 05/01/23 3086   senna-docusate (Senokot-S) tablet 1 tablet  1 tablet Oral QHS Gevena Mart A, NP   1 tablet at 04/30/23 2116   timolol (TIMOPTIC-XR) 0.5 % ophthalmic gel-forming 1 drop  1 drop Both Eyes q morning Rejeana Brock, MD   1 drop at 05/01/23 5784     Discharge Medications: Please see discharge summary for a list of discharge medications.  Relevant Imaging Results:  Relevant Lab Results:   Additional Information SS#: [id number]  Baldemar Lenis, LCSW

## 2023-05-01 NOTE — Progress Notes (Signed)
Inpatient Rehab Admissions Coordinator:  Saw pt and son at bedside. Reviewed CIR goals and expectations. They both acknowledged understanding. Son indicated that pt may not have 24/7 support after rehab. Discussed other rehab options as well. Pt/son still unsure about which type of rehab to pursue. They have requested to speak to a SW to gather more information in order to help them make a decision. Will continue to follow.   Wolfgang Phoenix, MS, CCC-SLP Admissions Coordinator [phone]

## 2023-05-02 ENCOUNTER — Inpatient Hospital Stay (HOSPITAL_COMMUNITY): Payer: Medicare Other

## 2023-05-02 ENCOUNTER — Other Ambulatory Visit: Payer: Self-pay

## 2023-05-02 ENCOUNTER — Encounter (HOSPITAL_COMMUNITY): Payer: Self-pay | Admitting: Neurology

## 2023-05-02 DIAGNOSIS — I619 Nontraumatic intracerebral hemorrhage, unspecified: Secondary | ICD-10-CM | POA: Diagnosis not present

## 2023-05-02 LAB — CBC
HCT: 54.8 % — ABNORMAL HIGH (ref 39.0–52.0)
Hemoglobin: 18.2 g/dL — ABNORMAL HIGH (ref 13.0–17.0)
MCH: 28 pg (ref 26.0–34.0)
MCHC: 33.2 g/dL (ref 30.0–36.0)
MCV: 84.4 fL (ref 80.0–100.0)
Platelets: 521 10*3/uL — ABNORMAL HIGH (ref 150–400)
RBC: 6.49 MIL/uL — ABNORMAL HIGH (ref 4.22–5.81)
RDW: 18.1 % — ABNORMAL HIGH (ref 11.5–15.5)
WBC: 26.4 10*3/uL — ABNORMAL HIGH (ref 4.0–10.5)
nRBC: 0 % (ref 0.0–0.2)

## 2023-05-02 LAB — URINALYSIS, W/ REFLEX TO CULTURE (INFECTION SUSPECTED)
Bilirubin Urine: NEGATIVE
Glucose, UA: NEGATIVE mg/dL
Ketones, ur: NEGATIVE mg/dL
Nitrite: NEGATIVE
Protein, ur: 30 mg/dL — AB
Specific Gravity, Urine: >1.03 — ABNORMAL HIGH (ref 1.005–1.030)
pH: 6 (ref 5.0–8.0)

## 2023-05-02 LAB — COMPREHENSIVE METABOLIC PANEL
ALT: 70 U/L — ABNORMAL HIGH (ref 0–44)
AST: 31 U/L (ref 15–41)
Albumin: 2.9 g/dL — ABNORMAL LOW (ref 3.5–5.0)
Alkaline Phosphatase: 75 U/L (ref 38–126)
Anion gap: 12 (ref 5–15)
BUN: 29 mg/dL — ABNORMAL HIGH (ref 8–23)
CO2: 24 mmol/L (ref 22–32)
Calcium: 8.6 mg/dL — ABNORMAL LOW (ref 8.9–10.3)
Chloride: 101 mmol/L (ref 98–111)
Creatinine, Ser: 1.11 mg/dL (ref 0.61–1.24)
GFR, Estimated: >60 mL/min (ref >60)
Glucose, Bld: 135 mg/dL — ABNORMAL HIGH (ref 70–99)
Potassium: 4.4 mmol/L (ref 3.5–5.1)
Sodium: 137 mmol/L (ref 135–145)
Total Bilirubin: 1.2 mg/dL (ref 0.3–1.2)
Total Protein: 7.2 g/dL (ref 6.5–8.1)

## 2023-05-02 LAB — CULTURE, BLOOD (ROUTINE X 2)
Culture: NO GROWTH
Culture: NO GROWTH
Special Requests: ADEQUATE
Special Requests: ADEQUATE

## 2023-05-02 MED ORDER — ORAL CARE MOUTH RINSE
15.0000 mL | OROMUCOSAL | Status: DC
  Administered 2023-05-02 – 2023-05-05 (×13): 15 mL via OROMUCOSAL

## 2023-05-02 MED ORDER — ORAL CARE MOUTH RINSE: 15.0000 mL | OROMUCOSAL | Status: DC | PRN

## 2023-05-02 NOTE — Progress Notes (Signed)
Physical Therapy Treatment Patient Details Name: Mason [name] MRN: [id number] DOB: 1945-08-26 Today's Date: 05/02/2023   History of Present Illness Pt is a 77 y.o. male who presented on 04/25/23 with right sided weakness and difficulty speaking. CT head with L subcortical hemorrhage in setting of Xarelto use.   Significant PMH includes diverticulitius, GERD, A-fib.    PT Comments  Pt seen for PT tx with pt agreeable, son arriving towards end of session. Pt is making slow progress with mobility but is able to complete supine<>sit with max assist +1 on this date. PT still requires heavy cuing/education to attempt to visually scan to R side of environment. Pt also limited by ongoing cervical flexion & difficulty to hold head upright. Will continue to follow pt acutely to progress bed mobility & transfers as able.    If plan is discharge home, recommend the following: Two people to help with walking and/or transfers;Two people to help with bathing/dressing/bathroom   Can travel by private vehicle        Equipment Recommendations  None recommended by PT (defer to next venue)    Recommendations for Other Services Rehab consult     Precautions / Restrictions Precautions Precautions: Fall Precaution Comments: R hemiparesis Restrictions Weight Bearing Restrictions: No     Mobility  Bed Mobility Overal bed mobility: Needs Assistance Bed Mobility: Rolling, Sidelying to Sit, Sit to Sidelying Rolling: Mod assist, Used rails Sidelying to sit: Max assist, HOB elevated, Used rails     Sit to sidelying: Max assist, HOB elevated, Used rails General bed mobility comments: PT educated pt on compensatory technique of rolling with pt requiring max cuing to turn head/scan with eyes to locate bed rail on R to grab with LUE to assist with rolling. Pt requires manual facilitation to place LLE behind RLE to push it towards EOB, assistance with uprighting trunk to sitting. Pt is able to scoot to [facility]  with bed in trendelenburg position with cuing to push with LLE vs only pulling with LUE on bed rails.    Transfers Overall transfer level:  (unsafe to attempt without +2 assist)                      Ambulation/Gait                   Stairs             Wheelchair Mobility     Tilt Bed    Modified Rankin (Stroke Patients Only)       Balance Overall balance assessment: Needs assistance Sitting-balance support: Single extremity supported, Feet supported Sitting balance-Leahy Scale: Poor   Postural control: Posterior lean, Right lateral lean                                  Cognition Arousal: Alert Behavior During Therapy: Flat affect Overall Cognitive Status: Impaired/Different from baseline Area of Impairment: Orientation, Attention, Memory, Following commands, Safety/judgement, Awareness, Problem solving                       Following Commands: Follows one step commands inconsistently, Follows one step commands with increased time Safety/Judgement: Decreased awareness of safety, Decreased awareness of deficits Awareness: Intellectual Problem Solving: Slow processing, Decreased initiation, Difficulty sequencing, Requires verbal cues, Requires tactile cues General Comments: decreased motivation        Exercises Other Exercises Other Exercises:  Session focused on visually scanning environment to locate objects in room, reaching for objects to R of midline, midline orientation & sitting balance. Pt with poor ability to control LOB nor return to midline after reaching. Other Exercises: PT educated/encouraged pt to extend cervical spine to neutral vs forward head. Pt able to hold head upright for max of 2 minutes with frequent cuing/encouragement. Other Exercises: Educated pt's son on pt's need to attend to R side of body & environment more.    General Comments        Pertinent Vitals/Pain Pain Assessment Pain Assessment:  Faces Faces Pain Scale: No hurt    Home Living Family/patient expects to be discharged to:: Skilled nursing facility Living Arrangements: Alone                      Prior Function            PT Goals (current goals can now be found in the care plan section) Acute Rehab PT Goals Patient Stated Goal: unable to state PT Goal Formulation: With patient Time For Goal Achievement: 05/10/23 Potential to Achieve Goals: Fair Additional Goals Additional Goal #1: Will increase R LE strength to 2/5 to assist with transfers Progress towards PT goals: Progressing toward goals    Frequency    Min 1X/week      PT Plan      Co-evaluation              AM-PAC PT "6 Clicks" Mobility   Outcome Measure  Help needed turning from your back to your side while in a flat bed without using bedrails?: A Lot Help needed moving from lying on your back to sitting on the side of a flat bed without using bedrails?: Total Help needed moving to and from a bed to a chair (including a wheelchair)?: Total Help needed standing up from a chair using your arms (e.g., wheelchair or bedside chair)?: Total Help needed to walk in hospital room?: Total Help needed climbing 3-5 steps with a railing? : Total 6 Click Score: 7    End of Session   Activity Tolerance: Patient tolerated treatment well Patient left: in bed;with call bell/phone within reach;with bed alarm set;with family/visitor present   PT Visit Diagnosis: Other abnormalities of gait and mobility (R26.89);Muscle weakness (generalized) (M62.81);Hemiplegia and hemiparesis Hemiplegia - Right/Left: Right Hemiplegia - dominant/non-dominant: Dominant Hemiplegia - caused by: Nontraumatic intracerebral hemorrhage     Time: 1259-1330 PT Time Calculation (min) (ACUTE ONLY): 31 min  Charges:    $Neuromuscular Re-education: 23-37 mins PT General Charges $$ ACUTE PT VISIT: 1 Visit                     Aleda Grana, PT, DPT 05/02/23,  2:44 PM   Sandi Mariscal 05/02/2023, 2:43 PM

## 2023-05-02 NOTE — TOC Progression Note (Signed)
Transition of Care [facility]) - Progression Note    Patient Details  Name: Mason [name] MRN: [id number] Date of Birth: [birth year]  Transition [facility]) CM/SW Contact  Baldemar Lenis, [place] Phone Number: 05/02/2023, 10:17 AM  Clinical Narrative:   CSW updated by Rehab Admissions that patient's family unsure about 24/7 assist, asking about SNF as well. CSW faxed out referral and met with patient's family to discuss SNF placement and answer questions. Family to review SNF options, discuss choice of CIR vs SNF, and CSW will check in with them tomorrow. CSW to follow.    Expected Discharge Plan: IP Rehab Facility Barriers to Discharge: Continued Medical Work up, English as a second language teacher  Expected Discharge Plan and Services In-house Referral: Clinical Social Work   Post Acute Care Choice: NA Living arrangements for the past 2 months: Single Family Home                                       Social Determinants of Health (SDOH) Interventions SDOH Screenings   Food Insecurity: No Food Insecurity (07/20/2022)  Housing: Low Risk  (07/20/2022)  Transportation Needs: No Transportation Needs (07/20/2022)  Utilities: Not At Risk (07/20/2022)  Alcohol Screen: Low Risk  (07/08/2021)  Depression (PHQ2-9): Low Risk  (04/14/2023)  Financial Resource Strain: Low Risk  (07/20/2022)  Physical Activity: Insufficiently Active (07/20/2022)  Social Connections: Socially Isolated (07/20/2022)  Stress: No Stress Concern Present (07/20/2022)  Tobacco Use: Medium Risk (04/14/2023)    Readmission Risk Interventions     No data to display

## 2023-05-02 NOTE — Plan of Care (Signed)
Problem: Education: Goal: Knowledge of disease or condition will improve 05/02/2023 1140 by Aretta Nip, RN Outcome: Not Progressing 05/02/2023 1140 by Aretta Nip, RN Outcome: Not Progressing Goal: Knowledge of secondary prevention will improve (MUST DOCUMENT ALL) 05/02/2023 1140 by Aretta Nip, RN Outcome: Not Progressing 05/02/2023 1140 by Aretta Nip, RN Outcome: Not Progressing Goal: Knowledge of patient specific risk factors will improve Loraine Leriche N/A or DELETE if not current risk factor) 05/02/2023 1140 by Aretta Nip, RN Outcome: Not Progressing 05/02/2023 1140 by Aretta Nip, RN Outcome: Not Progressing   Problem: Intracerebral Hemorrhage Tissue Perfusion: Goal: Complications of Intracerebral Hemorrhage will be minimized 05/02/2023 1140 by Aretta Nip, RN Outcome: Not Progressing 05/02/2023 1140 by Aretta Nip, RN Outcome: Not Progressing   Problem: Coping: Goal: Will verbalize positive feelings about self 05/02/2023 1140 by Aretta Nip, RN Outcome: Not Progressing 05/02/2023 1140 by Aretta Nip, RN Outcome: Not Progressing Goal: Will identify appropriate support needs 05/02/2023 1140 by Aretta Nip, RN Outcome: Not Progressing 05/02/2023 1140 by Aretta Nip, RN Outcome: Not Progressing   Problem: Health Behavior/Discharge Planning: Goal: Ability to manage health-related needs will improve 05/02/2023 1140 by Aretta Nip, RN Outcome: Not Progressing 05/02/2023 1140 by Aretta Nip, RN Outcome: Not Progressing Goal: Goals will be collaboratively established with patient/family 05/02/2023 1140 by Aretta Nip, RN Outcome: Not Progressing 05/02/2023 1140 by Aretta Nip, RN Outcome: Not Progressing   Problem: Self-Care: Goal: Ability to participate in self-care as condition permits will improve 05/02/2023 1140 by Aretta Nip, RN Outcome: Not Progressing 05/02/2023 1140 by Aretta Nip, RN Outcome: Not  Progressing Goal: Verbalization of feelings and concerns over difficulty with self-care will improve 05/02/2023 1140 by Aretta Nip, RN Outcome: Not Progressing 05/02/2023 1140 by Aretta Nip, RN Outcome: Not Progressing Goal: Ability to communicate needs accurately will improve 05/02/2023 1140 by Aretta Nip, RN Outcome: Not Progressing 05/02/2023 1140 by Aretta Nip, RN Outcome: Not Progressing   Problem: Nutrition: Goal: Risk of aspiration will decrease 05/02/2023 1140 by Aretta Nip, RN Outcome: Not Progressing 05/02/2023 1140 by Aretta Nip, RN Outcome: Not Progressing Goal: Dietary intake will improve 05/02/2023 1140 by Aretta Nip, RN Outcome: Not Progressing 05/02/2023 1140 by Aretta Nip, RN Outcome: Not Progressing   Problem: Education: Goal: Knowledge of General Education information will improve Description: Including pain rating scale, medication(s)/side effects and non-pharmacologic comfort measures 05/02/2023 1140 by Aretta Nip, RN Outcome: Not Progressing 05/02/2023 1140 by Aretta Nip, RN Outcome: Not Progressing   Problem: Health Behavior/Discharge Planning: Goal: Ability to manage health-related needs will improve 05/02/2023 1140 by Aretta Nip, RN Outcome: Not Progressing 05/02/2023 1140 by Aretta Nip, RN Outcome: Not Progressing   Problem: Clinical Measurements: Goal: Ability to maintain clinical measurements within normal limits will improve 05/02/2023 1140 by Aretta Nip, RN Outcome: Not Progressing 05/02/2023 1140 by Aretta Nip, RN Outcome: Not Progressing Goal: Will remain free from infection 05/02/2023 1140 by Aretta Nip, RN Outcome: Not Progressing 05/02/2023 1140 by Aretta Nip, RN Outcome: Not Progressing Goal: Diagnostic test results will improve 05/02/2023 1140 by Aretta Nip, RN Outcome: Not Progressing 05/02/2023 1140 by Aretta Nip, RN Outcome: Not Progressing Goal:  Respiratory complications will improve 05/02/2023 1140 by Aretta Nip, RN Outcome: Not Progressing 05/02/2023 1140 by Aretta Nip, RN Outcome: Not Progressing Goal: Cardiovascular complication will be avoided  05/02/2023 1140 by Aretta Nip, RN Outcome: Not Progressing 05/02/2023 1140 by Aretta Nip, RN Outcome: Not Progressing   Problem: Activity: Goal: Risk for activity intolerance will decrease 05/02/2023 1140 by Aretta Nip, RN Outcome: Not Progressing 05/02/2023 1140 by Aretta Nip, RN Outcome: Not Progressing   Problem: Nutrition: Goal: Adequate nutrition will be maintained 05/02/2023 1140 by Aretta Nip, RN Outcome: Not Progressing 05/02/2023 1140 by Aretta Nip, RN Outcome: Not Progressing   Problem: Coping: Goal: Level of anxiety will decrease 05/02/2023 1140 by Aretta Nip, RN Outcome: Not Progressing 05/02/2023 1140 by Aretta Nip, RN Outcome: Not Progressing   Problem: Elimination: Goal: Will not experience complications related to bowel motility 05/02/2023 1140 by Aretta Nip, RN Outcome: Not Progressing 05/02/2023 1140 by Aretta Nip, RN Outcome: Not Progressing Goal: Will not experience complications related to urinary retention 05/02/2023 1140 by Aretta Nip, RN Outcome: Not Progressing 05/02/2023 1140 by Aretta Nip, RN Outcome: Not Progressing   Problem: Pain Managment: Goal: General experience of comfort will improve 05/02/2023 1140 by Aretta Nip, RN Outcome: Progressing 05/02/2023 1140 by Aretta Nip, RN Outcome: Not Progressing   Problem: Safety: Goal: Ability to remain free from injury will improve 05/02/2023 1140 by Aretta Nip, RN Outcome: Not Progressing 05/02/2023 1140 by Aretta Nip, RN Outcome: Not Progressing   Problem: Skin Integrity: Goal: Risk for impaired skin integrity will decrease 05/02/2023 1140 by Aretta Nip, RN Outcome: Not Progressing 05/02/2023 1140 by  Aretta Nip, RN Outcome: Not Progressing

## 2023-05-02 NOTE — TOC Progression Note (Signed)
Transition of Care [facility]) - Progression Note    Patient Details  Name: Mason [name] MRN: [id number] Date of Birth: 07-31-1946  [facility] CM/SW Contact  Baldemar Lenis, [place] Phone Number: 05/02/2023, 3:38 PM  Clinical Narrative:   CSW met with patient's son at bedside to discuss decision of SNF vs CIR. Son would like to pursue SNF, asking about [facility]. CSW placed a couple of calls to [facility] to ask them to review referral, noting per chart review that they have declined. CSW met with son again to update that [facility] declined, he is asking for [facility] in [place]. CSW sent referral, left a message with Admissions to review. CSW to follow.    Expected Discharge Plan: IP Rehab Facility Barriers to Discharge: Continued Medical Work up, English as a second language teacher  Expected Discharge Plan and Services In-house Referral: Clinical Social Work   Post Acute Care Choice: NA Living arrangements for the past 2 months: Single Family Home                                       Social Determinants of Health (SDOH) Interventions SDOH Screenings   Food Insecurity: No Food Insecurity (05/02/2023)  Housing: Low Risk  (05/02/2023)  Transportation Needs: No Transportation Needs (05/02/2023)  Utilities: Not At Risk (05/02/2023)  Alcohol Screen: Low Risk  (07/08/2021)  Depression (PHQ2-9): Low Risk  (04/14/2023)  Financial Resource Strain: Low Risk  (07/20/2022)  Physical Activity: Insufficiently Active (07/20/2022)  Social Connections: Socially Isolated (07/20/2022)  Stress: No Stress Concern Present (07/20/2022)  Tobacco Use: Medium Risk (05/02/2023)    Readmission Risk Interventions     No data to display

## 2023-05-02 NOTE — Progress Notes (Signed)
Inpatient Rehab Admissions Coordinator:    I spoke with pt.' S son and he states that they have decided for Pt. To d/c to SNF instead of CIR. TOC aware. CIR will sign off.   Megan Salon, MS, CCC-SLP Rehab Admissions Coordinator  [phone] (celll) [phone] (office)

## 2023-05-02 NOTE — Plan of Care (Signed)
  Problem: Education: Goal: Knowledge of disease or condition will improve Outcome: Progressing Goal: Knowledge of secondary prevention will improve (MUST DOCUMENT ALL) Outcome: Progressing Goal: Knowledge of patient specific risk factors will improve (Mark N/A or DELETE if not current risk factor) Outcome: Progressing   Problem: Intracerebral Hemorrhage Tissue Perfusion: Goal: Complications of Intracerebral Hemorrhage will be minimized Outcome: Progressing   Problem: Coping: Goal: Will verbalize positive feelings about self Outcome: Progressing Goal: Will identify appropriate support needs Outcome: Progressing   Problem: Health Behavior/Discharge Planning: Goal: Ability to manage health-related needs will improve Outcome: Progressing Goal: Goals will be collaboratively established with patient/family Outcome: Progressing   Problem: Self-Care: Goal: Ability to participate in self-care as condition permits will improve Outcome: Progressing Goal: Verbalization of feelings and concerns over difficulty with self-care will improve Outcome: Progressing Goal: Ability to communicate needs accurately will improve Outcome: Progressing   Problem: Nutrition: Goal: Risk of aspiration will decrease Outcome: Progressing Goal: Dietary intake will improve Outcome: Progressing   Problem: Education: Goal: Knowledge of General Education information will improve Description: Including pain rating scale, medication(s)/side effects and non-pharmacologic comfort measures Outcome: Progressing   Problem: Health Behavior/Discharge Planning: Goal: Ability to manage health-related needs will improve Outcome: Progressing   Problem: Clinical Measurements: Goal: Ability to maintain clinical measurements within normal limits will improve Outcome: Progressing Goal: Will remain free from infection Outcome: Progressing Goal: Diagnostic test results will improve Outcome: Progressing Goal:  Respiratory complications will improve Outcome: Progressing Goal: Cardiovascular complication will be avoided Outcome: Progressing   Problem: Activity: Goal: Risk for activity intolerance will decrease Outcome: Progressing   Problem: Nutrition: Goal: Adequate nutrition will be maintained Outcome: Progressing   Problem: Coping: Goal: Level of anxiety will decrease Outcome: Progressing   Problem: Elimination: Goal: Will not experience complications related to bowel motility Outcome: Progressing Goal: Will not experience complications related to urinary retention Outcome: Progressing   Problem: Pain Managment: Goal: General experience of comfort will improve Outcome: Progressing   Problem: Safety: Goal: Ability to remain free from injury will improve Outcome: Progressing   Problem: Skin Integrity: Goal: Risk for impaired skin integrity will decrease Outcome: Progressing   

## 2023-05-02 NOTE — Progress Notes (Addendum)
Progress Note   Patient: Mason [name] WGN:[id number] DOB: [birth year] DOA: 04/25/2023     7 DOS: the patient was seen and examined on 05/02/2023   Brief hospital course: 76yo with hx afib on xarelto who presented with acute IPH, was on stroke service. As of 9/8, pt was deemed stable from Neuro service with transfer to [facility] for placement to rehab with Neurology signing off.    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 Deemed stable for placement. Initial consideration for CIR, family has since decided on SNF instead. TOC following  HTN -BP stable at this time -Neuro rec to keep sbp <160 -cont norvasc 10mg , losartan 25mg   Afib -currently rate controlled -not on any rate controlling med at this time -off anticoagulation per above  HLD -Neuro recs for statin at time of d/c  Dysphagia -on dysphagia 2 with honey thick liquids -cont to advance diet as tolerated  Constipation -Recently had no significant stool output despite multiple cathartics -Finally had good results with SMOG enema 9/9  Bladder outlet obstruction -continued to require I/o caths over past 24h -Now with indwelling foley -Will check renal US   Leukocytosis -Unclear etiology -Afebrile -most recent blood cx on 9/5 neg, CXR on 9/5 neg, UA on 9/4 neg -given bladder outlet obstruction, will recheck UA -recheck cbc in AM  ADDENDUM: -Noted to be febrile later in evening -Will also re-check CXR and repeat blood cx     Subjective: Finally had multiple BM overnight. Still retaining urine, now with foley  Physical Exam: Vitals:   05/02/23 0805 05/02/23 1100 05/02/23 1147 05/02/23 1603  BP: (!) 155/93  139/84 (!) 153/86  Pulse: 85  77 85  Resp: 16  17 16   Temp: 98.8 F (37.1 C)  98.5 F (36.9 C) 98.8 F (37.1 C)   TempSrc: Oral  Oral Oral  SpO2: 93%  96% 96%  Weight:  81.8 kg    Height:  5' 9.5" (1.765 m)     General exam: Conversant, in no acute distress Respiratory system: normal chest rise, clear, no audible wheezing Cardiovascular system: regular rhythm, s1-s2 Gastrointestinal system: Nondistended, nontender, pos BS Central nervous system: R sided weakness, no tremors Extremities: No cyanosis, no joint deformities Skin: No rashes, no pallor Psychiatry: difficult to assess given neurologic deficit  Data Reviewed:  Labs reviewed: Na 137, K 4.4, Cr 1.11, WBC 26.4, hgb 18.2  Family Communication: Pt in room, family at bedside  Disposition: Status is: Inpatient Remains inpatient appropriate because: severity of illness  Planned Discharge Destination: Rehab    Author: Rickey Barbara, MD 05/02/2023 5:36 PM  For on call review [email or web address].

## 2023-05-02 NOTE — Progress Notes (Signed)
Speech Language Pathology Treatment: Dysphagia  Patient Details Name: Mason [name] MRN: [id number] DOB: 1946-05-15 Today's Date: 05/02/2023 Time: 1191-4782 SLP Time Calculation (min) (ACUTE ONLY): 15 min  Assessment / Plan / Recommendation Clinical Impression  Pt seen by SLP for skilled dysphagia treatment. Pt is making good progress towards his goals and is tolerating his current diet well. The pt's son was in the room for this treatment. Speech seemed to have improved intelligibility and functionality. Honey-thick liquid was administered (via cup and straw) and puree (applesauce) via spoon. Anterior spillage noted when drinking larger volumes via cup. Straw was tolerated well and decreased spillage. Oral phase swallowing appeared functional with puree, as puree was transported timely and no residue remained. Right-sided weakness/flaccidness seemed reduced when compared to immediately following stroke (09/04). Given time since onset, improvement in right-sided function and sensation, toleration of current diet, and d/c plans, SLP recommending a new modified barium swallow study be completed measure progress and potentially upgrade his PO diet.   HPI HPI: Pt is a 77 y.o. male who presneted on 04/25/23 with right sided weakness and difficulty speaking. CT head revealed acute intraparenchymal hematoma at the left thalamic capsular region.   Significant PMH includes diverticulitius, GERD, A-fib.      SLP Plan  Continue with current plan of care;MBS      Recommendations for follow up therapy are one component of a multi-disciplinary discharge planning process, led by the attending physician.  Recommendations may be updated based on patient status, additional functional criteria and insurance authorization.    Recommendations  Diet recommendations: Dysphagia 2 (fine chop);Honey-thick liquid Liquids provided via: Cup;Straw Medication Administration: Whole meds with puree Supervision: Staff to  assist with self feeding;Full supervision/cueing for compensatory strategies Compensations: Slow rate;Small sips/bites;Minimize environmental distractions;Monitor for anterior loss Postural Changes and/or Swallow Maneuvers: Seated upright 90 degrees                  Oral care BID;Staff/trained caregiver to provide oral care   Set up Supervision/Assistance Dysphagia, oropharyngeal phase (R13.12)     Continue with current plan of care;MBS     Marline Backbone, Senaida Lange., Speech Therapy Student

## 2023-05-03 ENCOUNTER — Inpatient Hospital Stay (HOSPITAL_COMMUNITY): Payer: Medicare Other

## 2023-05-03 DIAGNOSIS — I63429 Cerebral infarction due to embolism of unspecified anterior cerebral artery: Secondary | ICD-10-CM | POA: Diagnosis not present

## 2023-05-03 LAB — COMPREHENSIVE METABOLIC PANEL
ALT: 65 U/L — ABNORMAL HIGH (ref 0–44)
AST: 29 U/L (ref 15–41)
Albumin: 2.8 g/dL — ABNORMAL LOW (ref 3.5–5.0)
Alkaline Phosphatase: 79 U/L (ref 38–126)
Anion gap: 12 (ref 5–15)
BUN: 40 mg/dL — ABNORMAL HIGH (ref 8–23)
CO2: 23 mmol/L (ref 22–32)
Calcium: 8.5 mg/dL — ABNORMAL LOW (ref 8.9–10.3)
Chloride: 102 mmol/L (ref 98–111)
Creatinine, Ser: 1.08 mg/dL (ref 0.61–1.24)
GFR, Estimated: >60 mL/min (ref >60)
Glucose, Bld: 108 mg/dL — ABNORMAL HIGH (ref 70–99)
Potassium: 4.5 mmol/L (ref 3.5–5.1)
Sodium: 137 mmol/L (ref 135–145)
Total Bilirubin: 1.1 mg/dL (ref 0.3–1.2)
Total Protein: 6.7 g/dL (ref 6.5–8.1)

## 2023-05-03 LAB — CBC WITH DIFFERENTIAL/PLATELET
Abs Immature Granulocytes: 0.28 10*3/uL — ABNORMAL HIGH (ref 0.00–0.07)
Basophils Absolute: 0.2 10*3/uL — ABNORMAL HIGH (ref 0.0–0.1)
Basophils Relative: 1 %
Eosinophils Absolute: 0.5 10*3/uL (ref 0.0–0.5)
Eosinophils Relative: 2 %
HCT: 53.2 % — ABNORMAL HIGH (ref 39.0–52.0)
Hemoglobin: 17 g/dL (ref 13.0–17.0)
Immature Granulocytes: 1 %
Lymphocytes Relative: 5 %
Lymphs Abs: 1.3 10*3/uL (ref 0.7–4.0)
MCH: 27.6 pg (ref 26.0–34.0)
MCHC: 32 g/dL (ref 30.0–36.0)
MCV: 86.5 fL (ref 80.0–100.0)
Monocytes Absolute: 1.4 10*3/uL — ABNORMAL HIGH (ref 0.1–1.0)
Monocytes Relative: 5 %
Neutro Abs: 22.4 10*3/uL — ABNORMAL HIGH (ref 1.7–7.7)
Neutrophils Relative %: 86 %
Platelets: 521 10*3/uL — ABNORMAL HIGH (ref 150–400)
RBC: 6.15 MIL/uL — ABNORMAL HIGH (ref 4.22–5.81)
RDW: 18.2 % — ABNORMAL HIGH (ref 11.5–15.5)
WBC: 25.9 10*3/uL — ABNORMAL HIGH (ref 4.0–10.5)
nRBC: 0 % (ref 0.0–0.2)

## 2023-05-03 MED ORDER — MELATONIN 3 MG PO TABS
3.0000 mg | ORAL_TABLET | Freq: Every evening | ORAL | Status: DC | PRN
  Administered 2023-05-04 (×2): 3 mg via ORAL
  Filled 2023-05-03 (×2): qty 1

## 2023-05-03 MED ORDER — SODIUM CHLORIDE 0.9 % IV SOLN
3.0000 g | Freq: Four times a day (QID) | INTRAVENOUS | Status: DC
  Administered 2023-05-03 – 2023-05-04 (×5): 3 g via INTRAVENOUS
  Filled 2023-05-03 (×5): qty 8

## 2023-05-03 MED ORDER — TAMSULOSIN HCL 0.4 MG PO CAPS
0.8000 mg | ORAL_CAPSULE | Freq: Every day | ORAL | Status: DC
  Administered 2023-05-03: 0.8 mg via ORAL
  Filled 2023-05-03: qty 2

## 2023-05-03 NOTE — Progress Notes (Signed)
Pharmacy Antibiotic Note  Mason [name] is a 77 y.o. male admitted on 04/25/2023 with leukocytosis  and possible pneumonia vs UTI.  Pharmacy has been consulted for ampicillin/sulbactam dosing.  UA with large hgb, WBC 21-50 with bladder outlet obstruction. CXR with possible infiltrate.   Plan: ampicillin/sulbactam 3g q6hr Monitor cultures, clinical status, renal function  Narrow abx as able and f/u duration   Height: 5' 9.5" (176.5 cm) Weight: 81.8 kg (180 lb 5.4 oz) IBW/kg (Calculated) : 71.85  Temp (24hrs), Avg:99.2 F (37.3 C), Min:98.4 F (36.9 C), Max:100.9 F (38.3 C)  Recent Labs  Lab 04/28/23 0732 04/29/23 0545 04/30/23 0946 05/01/23 0723 05/02/23 0800 05/03/23 0842  WBC 20.4* 17.1* 16.6* 21.9* 26.4* 25.9*  CREATININE 1.03 1.10 1.11 0.92 1.11  --     Estimated Creatinine Clearance: 55.8 mL/min (by C-G formula based on SCr of 1.11 mg/dL).    Allergies  Allergen Reactions   Shellfish Allergy Shortness Of Breath, Rash and Other (See Comments)    GI upset Difficulty breathing  Reaction to shrimp and crab   Ak-Mycin [Erythromycin] Other (See Comments)    GI upset   Celexa [Citalopram] Other (See Comments)    Aggression    Levsin [Hyoscyamine] Other (See Comments)    Urinary retention   Librax [Chlordiazepoxide-Clidinium] Other (See Comments)    Urinary retention   Mobic [Meloxicam] Other (See Comments)    GI upset   Penicillins Itching and Rash    Antimicrobials this admission: ampsulb 9/11 >>   Microbiology results: 9/10 BCx: ngtd 9/10 UCx: pend 9/5 Bcx ngtd  9/3 MRSA PCR: neg  Thank you for allowing pharmacy to be a part of this patient's care.  Alphia Moh, PharmD, BCPS, BCCP Clinical Pharmacist  Please check AMION for all [facility] Pharmacy phone numbers After 10:00 PM, call Main Pharmacy [phone]

## 2023-05-03 NOTE — Progress Notes (Signed)
Occupational Therapy Treatment Patient Details Name: Mason [name] MRN: [id number] DOB: [birth year] Today's Date: 05/03/2023   History of present illness Pt is a 77 y.o. male who presented on 04/25/23 with right sided weakness and difficulty speaking. CT head with L subcortical hemorrhage in setting of Xarelto use.   Significant PMH includes diverticulitius, GERD, A-fib.   OT comments  Pt making steady progress towards OT goals this session. Pt continues to present with impaired balance, hemiparesis, R inattention and generalized deconditioning . Pt currently requires MAX A for bed mobility, MAX A +2 for sit>stands, and up to MAX A for static sitting balance. RUE continues to be flaccid however education provided on self ROM techniques as well as neuromuscular reeducation techniques to improve AROM to affected extremity. Pt continues to require MAX cues to maintain upright posture during sit>stands but able to complete x3 reps with an emphasis on RLE quad activation. Of note pts family declined CIR, plan now for ST SNF, updated DC recs as indicated below and alerted OTR and change in POC. Pt would continue to benefit from skilled occupational therapy while admitted and after d/c to address the below listed limitations in order to improve overall functional mobility and facilitate independence with BADL participation. DC plan remains appropriate, will follow acutely per POC.         If plan is discharge home, recommend the following:  Two people to help with walking and/or transfers;Two people to help with bathing/dressing/bathroom;Assistance with cooking/housework;Assistance with feeding;Direct supervision/assist for medications management;Direct supervision/assist for financial management;Assist for transportation;Help with stairs or ramp for entrance   Equipment Recommendations  Other (comment) (defer)    Recommendations for Other Services      Precautions / Restrictions  Precautions Precautions: Fall Precaution Comments: R hemiparesis Restrictions Weight Bearing Restrictions: No       Mobility Bed Mobility Overal bed mobility: Needs Assistance Bed Mobility: Rolling, Sidelying to Sit, Sit to Sidelying Rolling: Mod assist, Used rails Sidelying to sit: Max assist, HOB elevated, Used rails     Sit to sidelying: Max assist, HOB elevated, Used rails General bed mobility comments: pt completed rolling  to R<>L for pericare with pt requiring max cuing to turn head/scan with eyes to locate bed rail on R to grab with LUE to assist with rolling. Pt requires manual facilitation to maneuver RLE to EOB, pt required step by step cues to sequence bed mobility tasks such as reaching for bed rail with LUE to elevate trunk into sitting    Transfers Overall transfer level: Needs assistance Equipment used: 2 person hand held assist Transfers: Sit to/from Stand Sit to Stand: Max assist, +2 physical assistance, From elevated surface           General transfer comment: Pt transferred STS from elevated EOB with face to face assist with use of gait belt and bed pad to elevate hips.  cuing for upright posture & anterior pelvic shift but poor return demo, but pt does weight bear through BLE, activation noted in RLE. Pt unable to hold head upright which causes further trunk flexion & lean. pt was able to use back of recliner on L side to assist with pulling into standing     Balance Overall balance assessment: Needs assistance Sitting-balance support: Single extremity supported, Feet supported Sitting balance-Leahy Scale: Poor Sitting balance - Comments: pt required up to MAX A for sitting balance d/t R lean and posterior lean Postural control: Posterior lean, Right lateral lean Standing balance support: Bilateral upper  extremity supported Standing balance-Leahy Scale: Zero                             ADL either performed or assessed with clinical judgement    ADL Overall ADL's : Needs assistance/impaired             Lower Body Bathing: Total assistance;Bed level Lower Body Bathing Details (indicate cue type and reason): simulated via posterior pericare d/t incontinent BM         Toilet Transfer: Maximal assistance;+2 for physical assistance;+2 for safety/equipment (sit>stand only) Toilet Transfer Details (indicate cue type and reason): MAX A +2 for sit>stand from EOB using back of recliner to pull up with LUE Toileting- Clothing Manipulation and Hygiene: Total assistance;+2 for physical assistance;Bed level Toileting - Clothing Manipulation Details (indicate cue type and reason): tech completed posterior pericare d/t incontinent BM from bed level     Functional mobility during ADLs: Maximal assistance;+2 for physical assistance;+2 for safety/equipment;Total assistance (sit>stand from EOB) General ADL Comments: ADL participation impacted by imparied balance, R sided weakness, impaired posture, and decreased activity tolerance    Extremity/Trunk Assessment Upper Extremity Assessment Upper Extremity Assessment: RUE deficits/detail RUE Deficits / Details: flaccid RUE Sensation: decreased light touch;decreased proprioception RUE Coordination: decreased fine motor;decreased gross motor   Lower Extremity Assessment Lower Extremity Assessment: Defer to PT evaluation   Cervical / Trunk Assessment Cervical / Trunk Assessment: Other exceptions Cervical / Trunk Exceptions: Posture normal; poor trunk control/strength/balance, poor head control    Vision Baseline Vision/History: 0 No visual deficits Patient Visual Report: No change from baseline Vision Assessment?: Vision impaired- to be further tested in functional context Additional Comments: needs further assessment, R inattention noted   Perception Perception Perception: Impaired Preception Impairment Details: Inattention/Neglect Perception-Other Comments: R sided inattention to  enviornemnt and body   Praxis Praxis Praxis: Not tested    Cognition Arousal: Alert Behavior During Therapy: Flat affect Overall Cognitive Status: Impaired/Different from baseline Area of Impairment: Attention, Memory, Following commands, Safety/judgement, Awareness, Problem solving                   Current Attention Level: Sustained Memory: Decreased recall of precautions, Decreased short-term memory Following Commands: Follows one step commands inconsistently, Follows one step commands with increased time Safety/Judgement: Decreased awareness of safety, Decreased awareness of deficits Awareness: Intellectual Problem Solving: Slow processing, Decreased initiation, Difficulty sequencing, Requires verbal cues, Requires tactile cues General Comments: apathetic during session, needing increased time to follow commands        Exercises Other Exercises Other Exercises: PROM to RUE via elbow flexion/extension,    Shoulder Instructions       General Comments pts son present during session, education provided on self ROM for RUE and neurmuscular reeducation tasks to improve attention to Rside    Pertinent Vitals/ Pain       Pain Assessment Pain Assessment: No/denies pain  Home Living                                          Prior Functioning/Environment              Frequency  Min 1X/week        Progress Toward Goals  OT Goals(current goals can now be found in the care plan section)  Progress towards OT goals: Progressing toward goals  Acute  Rehab OT Goals Patient Stated Goal: none stated Time For Goal Achievement: 05/10/23 Potential to Achieve Goals: Fair  Plan      Co-evaluation                 AM-PAC OT "6 Clicks" Daily Activity     Outcome Measure   Help from another person eating meals?: A Lot Help from another person taking care of personal grooming?: A Lot Help from another person toileting, which includes using  toliet, bedpan, or urinal?: Total Help from another person bathing (including washing, rinsing, drying)?: A Lot Help from another person to put on and taking off regular upper body clothing?: A Lot Help from another person to put on and taking off regular lower body clothing?: Total 6 Click Score: 10    End of Session Equipment Utilized During Treatment: Gait belt  OT Visit Diagnosis: Other abnormalities of gait and mobility (R26.89);Hemiplegia and hemiparesis;Cognitive communication deficit (R41.841) Hemiplegia - Right/Left: Right Hemiplegia - dominant/non-dominant: Dominant Hemiplegia - caused by: Nontraumatic SAH   Activity Tolerance Patient tolerated treatment well   Patient Left in bed;with call bell/phone within reach;with bed alarm set   Nurse Communication Mobility status        Time: 8295-6213 OT Time Calculation (min): 25 min  Charges: OT General Charges $OT Visit: 1 Visit OT Treatments $Therapeutic Activity: 23-37 mins  Lenor Derrick., COTA/L Acute Rehabilitation Services [phone]   Mason [name] 05/03/2023, 1:16 PM

## 2023-05-03 NOTE — TOC Progression Note (Signed)
Transition of Care [facility]) - Progression Note    Patient Details  Name: Mason [name] MRN: [id number] Date of Birth: 1945-12-11  [facility] CM/SW Contact  Baldemar Lenis, [place] Phone Number: 05/03/2023, 11:36 AM  Clinical Narrative:   Patient received bed offer from [facility]. CSW met with son, Standly, at bedside to update, and he would like to accept. CSW requested CMA initiate insurance authorization request. CSW to follow.    Expected Discharge Plan: Skilled Nursing Facility Barriers to Discharge: Continued Medical Work up, English as a second language teacher  Expected Discharge Plan and Services In-house Referral: Clinical Social Work   Post Acute Care Choice: NA Living arrangements for the past 2 months: Single Family Home                                       Social Determinants of Health (SDOH) Interventions SDOH Screenings   Food Insecurity: No Food Insecurity (05/02/2023)  Housing: Low Risk  (05/02/2023)  Transportation Needs: No Transportation Needs (05/02/2023)  Utilities: Not At Risk (05/02/2023)  Alcohol Screen: Low Risk  (07/08/2021)  Depression (PHQ2-9): Low Risk  (04/14/2023)  Financial Resource Strain: Low Risk  (07/20/2022)  Physical Activity: Insufficiently Active (07/20/2022)  Social Connections: Socially Isolated (07/20/2022)  Stress: No Stress Concern Present (07/20/2022)  Tobacco Use: Medium Risk (05/02/2023)    Readmission Risk Interventions     No data to display

## 2023-05-03 NOTE — Procedures (Signed)
Modified Barium Swallow Study  Patient Details  Name: Mason [name] MRN: [id number] Date of Birth: August 30, 1945  Today's Date: 05/03/2023  Modified Barium Swallow completed.  Full report located under Chart Review in the Imaging Section.  History of Present Illness Pt is a 77 y.o. male who presneted on 04/25/23 with right sided weakness and difficulty speaking. CT head revealed acute intraparenchymal hematoma at the left thalamic capsular region.   Significant PMH includes diverticulitius, GERD, A-fib.   Clinical Impression Pt seen by SLP for modified barium swallow study to monitor progress. Pt presents with a mild oropharyngeal dysphagia characterized primarily by reduced coordination of pharyngeal structures and sensory impairments. Improvement in swallow function was observed as compared to previous study on 09/04. One instance of aspiration occurred with nectar thick liquid. The pt had increased awareness of aspirate, but did not demonstrate a protective cough. Volitional cough had improved strength. On 09/04, pt consistently silently aspirated thin and nectar. Chin-tuck maneuver with cup and straw effectively increased airway protection, reduced swallow initiation delay, and resulted in no aspiration. Chin tuck maneuver was effectively used with both thin and nectar thick consistencies with similar outcomes noted. Without chin-tuck, swallow initiated at the level of the pyriform sinuses. When utilizing the maneuver, swallow initiated at the vallecula. Improved coordination of epiglottic inversion and laryngeal vestibule closure both observed. Pt effectively followed directions for chin-tuck and demonstrated understanding of its importance. Improvement in use of compensatory strategy can be attributed to improved cognition and practice using chin-tuck with another SLP. Recommendation of Dys 2, thin liquids with utilization of chin-tuck with straw or cup. Full supervision to ensure use of  compensatory strategies. SLP will continue to follow to ensure diet toleration and compensatory strategy compliance. Factors that may increase risk of adverse event in presence of aspiration Rubye Oaks & Clearance Coots 2021): Reduced cognitive function  Swallow Evaluation Recommendations Recommendations: PO diet PO Diet Recommendation: Dysphagia 2 (Finely chopped);Thin liquids (Level 0) Liquid Administration via: Cup;Straw Medication Administration: Whole meds with puree Supervision: Full supervision/cueing for swallowing strategies;Full assist for feeding;Staff to assist with self-feeding Swallowing strategies  : Slow rate;Small bites/sips;Chin tuck Postural changes: Position pt fully upright for meals Oral care recommendations: Oral care BID (2x/day);Staff/trained caregiver to provide oral care      Marline Backbone, Senaida Lange., Speech Therapy Student

## 2023-05-03 NOTE — Progress Notes (Addendum)
HOSPITALIST ROUNDING NOTE Mason [name] [id number]  DOB: Jan 18, 1946  DOA: 04/25/2023  PCP: Joaquim Nam, MD  05/03/2023,11:09 AM   LOS: 8 days      Code Status: Full code   From: Home  current Dispo: Skilled eventually     77 year old white male home dwelling A-fib CHADVASC >4 on Xarelto, diverticulosis, anxiety and depression Portal venous thrombosis with unclear etiology hematology consulted GI consulted and was supposed to follow-up with Dr. Orlie Dakin in 2021 Underlying A-fib diagnosed 2021 Alcohol dependency  Admitted by neurology 04/25/2023 to the ICU with left-sided subcortical hemorrhage-reversed with Andexxa CTA neck 6 mm ulceration aortic arch CTA chest to segmental right upper lobe pulmonary artery suspicious for acute PE [later this was felt to be insignificant by pulmonary critical care who over read the CT) MRI brain 2 x 2.3 x 3.0 cm 7 mm intraparenchymal hemorrhage with vasogenic edema Bilateral duplex negative While on neurology service he stabilized was placed on dysphagia 2 diet but had a white count Hospitalist service assumed care 04/30/2023  9/10 renal ultrasound negative for hydronephrosis, CXR atelectasis versus pneumonia  Plan  Leukocytosis, concern for right-sided pneumonia Will discuss with SLP-remains on dysphagia 2 diet with thin liquids UA performed 9/10 not really significant for UTI CXR not completely conclusive but does show atelectasis so we will start incentive spirometry every 2 hours and start Unasyn and observe as I think he is at high risk for aspiration given his dense deficits--follow-up blood culture and urine culture 9/10 Family declined CIR-eventual skilled when stable  Dysphagia with probable aspiration-see above  Interested thalamic subcortical ICH Xarelto reversed with Andexxa on arrival Needs reeval with neurology in 8 weeks with regards to anticoagulation  Hypertension Goal blood pressure with ICH is less than 160-continues on  amlodipine 10 labetalol 20 every 2 as needed blood pressure above 160 and losartan 25 Add low-dose metoprolol 12.5 XL today  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 Smog enema given 9/9 with good result now holding given diarrhea Started requiring catheterization on 9/9-has indwelling Foley-  A-fib CHADVASC >4 No anticoagulation for now  Monitor trends on telemetry   DVT prophylaxis: Heparin only  Status is: Inpatient Remains inpatient appropriate because:   Requires further workup    Subjective: Dense deficits on the right side is verbal however seems to understand Son at bedside reports continued diarrhea since enema He now has a catheter He is afebrile to touch He was graduated to dysphagia 2 with thin liquids this morning according to son  Objective + exam Vitals:   05/02/23 2029 05/03/23 0000 05/03/23 0435 05/03/23 0728  BP: (!) 140/87 (!) 146/79 126/79 (!) 144/80  Pulse: 88 84 72 75  Resp: 18 20 18 17   Temp: (!) 100.5 F (38.1 C) 98.4 F (36.9 C) 98.7 F (37.1 C) 98.6 F (37 C)  TempSrc: Oral Oral Oral Oral  SpO2: 95% 93%  93%  Weight:      Height:       Filed Weights   04/25/23 2106 05/02/23 1100  Weight: 81.8 kg 81.8 kg    Examination:  Dense right-sided deficit cannot raise right arm which appears little swollen cannot raise right leg but does have trace downgoing toe Reflexes are attenuated and DTRs on the right side and they are attenuated on the left Chest is clear anteriorly decreased air entry posterolaterally equally S1-S2 no murmur seems to be in sinus  Data Reviewed: reviewed   CBC    Component Value Date/Time  WBC 25.9 (H) 05/03/2023 0842   RBC 6.15 (H) 05/03/2023 0842   HGB 17.0 05/03/2023 0842   HCT 53.2 (H) 05/03/2023 0842   PLT 521 (H) 05/03/2023 0842   MCV 86.5 05/03/2023 0842   MCH 27.6 05/03/2023 0842   MCHC 32.0 05/03/2023 0842   RDW 18.2 (H) 05/03/2023 0842   LYMPHSABS 1.3 05/03/2023 0842   MONOABS 1.4 (H)  05/03/2023 0842   EOSABS 0.5 05/03/2023 0842   BASOSABS 0.2 (H) 05/03/2023 0842      Latest Ref Rng & Units 05/03/2023    8:42 AM 05/02/2023    8:00 AM 05/01/2023    7:23 AM  CMP  Glucose 70 - 99 mg/dL 161  096  045   BUN 8 - 23 mg/dL 40  29  25   Creatinine 0.61 - 1.24 mg/dL 4.09  8.11  9.14   Sodium 135 - 145 mmol/L 137  137  133   Potassium 3.5 - 5.1 mmol/L 4.5  4.4  4.0   Chloride 98 - 111 mmol/L 102  101  101   CO2 22 - 32 mmol/L 23  24  24    Calcium 8.9 - 10.3 mg/dL 8.5  8.6  8.2   Total Protein 6.5 - 8.1 g/dL 6.7  7.2  6.7   Total Bilirubin 0.3 - 1.2 mg/dL 1.1  1.2  0.9   Alkaline Phos 38 - 126 U/L 79  75  77   AST 15 - 41 U/L 29  31  30    ALT 0 - 44 U/L 65  70  51      Scheduled Meds:  amLODipine  10 mg Oral Daily   Chlorhexidine Gluconate Cloth  6 each Topical Daily   docusate sodium  100 mg Oral BID   fluticasone  1 spray Each Nare Daily   heparin injection (subcutaneous)  5,000 Units Subcutaneous Q8H   latanoprost  1 drop Both Eyes QHS   losartan  25 mg Oral Daily   mouth rinse  15 mL Mouth Rinse 4 times per day   pantoprazole  40 mg Oral QHS   polyethylene glycol  17 g Oral BID   senna-docusate  1 tablet Oral QHS   tamsulosin  0.8 mg Oral QPC supper   timolol  1 drop Both Eyes q morning   Continuous Infusions:  ampicillin-sulbactam (UNASYN) IV 3 g (05/03/23 1103)    Time  40  Rhetta Mura, MD  [facility]

## 2023-05-03 NOTE — Progress Notes (Signed)
Physical Therapy Treatment Patient Details Name: Mason [name] MRN: [id number] DOB: 17-Jan-1946 Today's Date: 05/03/2023   History of Present Illness Pt is a 77 y.o. male who presented on 04/25/23 with right sided weakness and difficulty speaking. CT head with L subcortical hemorrhage in setting of Xarelto use. Significant PMH includes diverticulitius, GERD, A-fib.    PT Comments  Agreeable to PT session. Emphasis on improving independence and safety with bed mobility and sit to stand transfers. Sat EOB majority of session, working on midline control and weight shifting Lt and Rt onto elbows. Majority of session required mod assist for trunk control. Sit<>stand transition with Stedy required max assist +2 for balance and boost, RUE support, Rt knee blocked by device. Shows good effort to push through LLE and pull with LUE. Tolerated 2 bouts of standing up to 20 seconds ea. Pt with bowel incontinence, assisted with peri-care while working on independence with rolling and repositioning in bed, utilized rails nicely. Patient will continue to benefit from skilled physical therapy services to further improve independence with functional mobility.     If plan is discharge home, recommend the following: Two people to help with walking and/or transfers;Two people to help with bathing/dressing/bathroom;Assist for transportation;Direct supervision/assist for financial management;Direct supervision/assist for medications management;Assistance with cooking/housework;Assistance with feeding   Can travel by private vehicle     No  Equipment Recommendations  None recommended by PT (defer to next venue)    Recommendations for Other Services       Precautions / Restrictions Precautions Precautions: Fall Precaution Comments: R hemiparesis Restrictions Weight Bearing Restrictions: No     Mobility  Bed Mobility Overal bed mobility: Needs Assistance Bed Mobility: Rolling, Sidelying to Sit, Sit to  Sidelying Rolling: Mod assist, Used rails Sidelying to sit: Max assist, HOB elevated, Used rails     Sit to sidelying: Max assist, HOB elevated, Used rails General bed mobility comments: Mod assist for rolling, using rails, cues for LUE to grasp. Assisted with rolling for pericare. From sidelying able to rise to EOB with max assist for RLE support and trunk. Poor trunk control once upright, leaning posteriorly and compensating with over amplified movements. Educated on techniques. Able to facilitate lean back down onto Lt shoulder however uncoordinated, max assist for safety and control, with need for LE support back into bed.    Transfers Overall transfer level: Needs assistance Equipment used: Ambulation equipment used Transfers: Sit to/from Stand Sit to Stand: Max assist, +2 physical assistance, From elevated surface           General transfer comment: Max assist +2 for boost to stand with Stedy, Supported RUE and Rt knee blocked with knee pad. Pt with poor trunk control, needs assist to prevent leaning too far forward. Once upright able to tolerate standing approx 20 seconds, with cues to tuck bottom and stand upright with upright head control and gaze. Performed x 2 from EOB today. Transfer via Lift Equipment: Stedy  Ambulation/Gait                   Stairs             Wheelchair Mobility     Tilt Bed    Modified Rankin (Stroke Patients Only) Modified Rankin (Stroke Patients Only) Pre-Morbid Rankin Score: No symptoms Modified Rankin: Severe disability     Balance Overall balance assessment: Needs assistance Sitting-balance support: Single extremity supported, Feet supported Sitting balance-Leahy Scale: Poor Sitting balance - Comments: pt required up to MAX  A for sitting balance d/t R lean and posterior lean. Intermittent pushing with LUE towards Rt side. Postural control: Posterior lean, Right lateral lean Standing balance support: Bilateral upper  extremity supported Standing balance-Leahy Scale: Zero Mason [name])                              Cognition Arousal: Alert Behavior During Therapy: Flat affect Overall Cognitive Status: Impaired/Different from baseline Area of Impairment: Attention, Memory, Following commands, Safety/judgement, Awareness, Problem solving                   Current Attention Level: Sustained Memory: Decreased recall of precautions, Decreased short-term memory Following Commands: Follows one step commands inconsistently, Follows one step commands with increased time Safety/Judgement: Decreased awareness of safety, Decreased awareness of deficits Awareness: Intellectual Problem Solving: Slow processing, Decreased initiation, Difficulty sequencing, Requires verbal cues, Requires tactile cues General Comments: apathetic during session, needing increased time to follow commands        Exercises Other Exercises Other Exercises: Worked on seated balance, midline awareness, and leaning trunk towards limits of stability with return to midline (up to max assist) Other Exercises: Upright posture sitting EOB with Mod assist majority of sesion working on cervical extension into neutral, poorly sustained.    General Comments General comments (skin integrity, edema, etc.): Son present, supportive, participated in therapy. Pt with bowel incontinence. Assited with peri-care and linen changed end of session.      Pertinent Vitals/Pain Pain Assessment Pain Assessment: No/denies pain Pain Intervention(s): Monitored during session    Home Living                          Prior Function            PT Goals (current goals can now be found in the care plan section) Acute Rehab PT Goals Patient Stated Goal: Get stronger PT Goal Formulation: With patient Time For Goal Achievement: 05/10/23 Potential to Achieve Goals: Fair Progress towards PT goals: Progressing toward goals     Frequency    Min 1X/week      PT Plan      Co-evaluation              AM-PAC PT "6 Clicks" Mobility   Outcome Measure  Help needed turning from your back to your side while in a flat bed without using bedrails?: A Lot Help needed moving from lying on your back to sitting on the side of a flat bed without using bedrails?: Total Help needed moving to and from a bed to a chair (including a wheelchair)?: Total Help needed standing up from a chair using your arms (e.g., wheelchair or bedside chair)?: Total Help needed to walk in hospital room?: Total Help needed climbing 3-5 steps with a railing? : Total 6 Click Score: 7    End of Session Equipment Utilized During Treatment: Gait belt Activity Tolerance: Patient tolerated treatment well Patient left: in bed;with call bell/phone within reach;with bed alarm set;with family/visitor present (Bed in chair mode) Nurse Communication: Mobility status;Need for lift equipment PT Visit Diagnosis: Other abnormalities of gait and mobility (R26.89);Muscle weakness (generalized) (M62.81);Hemiplegia and hemiparesis Hemiplegia - Right/Left: Right Hemiplegia - dominant/non-dominant: Dominant Hemiplegia - caused by: Nontraumatic intracerebral hemorrhage     Time: 1714-1745 PT Time Calculation (min) (ACUTE ONLY): 31 min  Charges:    $Therapeutic Activity: 8-22 mins $Neuromuscular Re-education: 8-22 mins PT General Charges $$  ACUTE PT VISIT: 1 Visit                     Kathlyn Sacramento, PT, DPT [facility]  Rehabilitation Services Physical Therapist Office: [phone] Website: [email or web address]    Berton Mount 05/03/2023, 6:03 PM

## 2023-05-03 NOTE — Plan of Care (Signed)
  Problem: Education: Goal: Knowledge of disease or condition will improve Outcome: Progressing Goal: Knowledge of secondary prevention will improve (MUST DOCUMENT ALL) Outcome: Progressing Goal: Knowledge of patient specific risk factors will improve (Mark N/A or DELETE if not current risk factor) Outcome: Progressing   Problem: Intracerebral Hemorrhage Tissue Perfusion: Goal: Complications of Intracerebral Hemorrhage will be minimized Outcome: Progressing   Problem: Coping: Goal: Will verbalize positive feelings about self Outcome: Progressing Goal: Will identify appropriate support needs Outcome: Progressing   Problem: Health Behavior/Discharge Planning: Goal: Ability to manage health-related needs will improve Outcome: Progressing Goal: Goals will be collaboratively established with patient/family Outcome: Progressing   Problem: Self-Care: Goal: Ability to participate in self-care as condition permits will improve Outcome: Progressing Goal: Verbalization of feelings and concerns over difficulty with self-care will improve Outcome: Progressing Goal: Ability to communicate needs accurately will improve Outcome: Progressing   Problem: Nutrition: Goal: Risk of aspiration will decrease Outcome: Progressing Goal: Dietary intake will improve Outcome: Progressing   Problem: Education: Goal: Knowledge of General Education information will improve Description: Including pain rating scale, medication(s)/side effects and non-pharmacologic comfort measures Outcome: Progressing   Problem: Health Behavior/Discharge Planning: Goal: Ability to manage health-related needs will improve Outcome: Progressing   Problem: Clinical Measurements: Goal: Ability to maintain clinical measurements within normal limits will improve Outcome: Progressing Goal: Will remain free from infection Outcome: Progressing Goal: Diagnostic test results will improve Outcome: Progressing Goal:  Respiratory complications will improve Outcome: Progressing Goal: Cardiovascular complication will be avoided Outcome: Progressing   Problem: Activity: Goal: Risk for activity intolerance will decrease Outcome: Progressing   Problem: Nutrition: Goal: Adequate nutrition will be maintained Outcome: Progressing   Problem: Coping: Goal: Level of anxiety will decrease Outcome: Progressing   Problem: Elimination: Goal: Will not experience complications related to bowel motility Outcome: Progressing Goal: Will not experience complications related to urinary retention Outcome: Progressing   Problem: Pain Managment: Goal: General experience of comfort will improve Outcome: Progressing   Problem: Safety: Goal: Ability to remain free from injury will improve Outcome: Progressing   Problem: Skin Integrity: Goal: Risk for impaired skin integrity will decrease Outcome: Progressing   

## 2023-05-04 DIAGNOSIS — I63429 Cerebral infarction due to embolism of unspecified anterior cerebral artery: Secondary | ICD-10-CM | POA: Diagnosis not present

## 2023-05-04 LAB — CBC WITH DIFFERENTIAL/PLATELET
Abs Immature Granulocytes: 0.16 10*3/uL — ABNORMAL HIGH (ref 0.00–0.07)
Basophils Absolute: 0.1 10*3/uL (ref 0.0–0.1)
Basophils Relative: 0 %
Eosinophils Absolute: 0.7 10*3/uL — ABNORMAL HIGH (ref 0.0–0.5)
Eosinophils Relative: 4 %
HCT: 51.4 % (ref 39.0–52.0)
Hemoglobin: 17 g/dL (ref 13.0–17.0)
Immature Granulocytes: 1 %
Lymphocytes Relative: 5 %
Lymphs Abs: 0.9 10*3/uL (ref 0.7–4.0)
MCH: 29 pg (ref 26.0–34.0)
MCHC: 33.1 g/dL (ref 30.0–36.0)
MCV: 87.6 fL (ref 80.0–100.0)
Monocytes Absolute: 0.5 10*3/uL (ref 0.1–1.0)
Monocytes Relative: 3 %
Neutro Abs: 15.8 10*3/uL — ABNORMAL HIGH (ref 1.7–7.7)
Neutrophils Relative %: 87 %
Platelets: 472 10*3/uL — ABNORMAL HIGH (ref 150–400)
RBC: 5.87 MIL/uL — ABNORMAL HIGH (ref 4.22–5.81)
RDW: 17.9 % — ABNORMAL HIGH (ref 11.5–15.5)
WBC: 18.1 10*3/uL — ABNORMAL HIGH (ref 4.0–10.5)
nRBC: 0 % (ref 0.0–0.2)

## 2023-05-04 LAB — BASIC METABOLIC PANEL
Anion gap: 9 (ref 5–15)
BUN: 36 mg/dL — ABNORMAL HIGH (ref 8–23)
CO2: 28 mmol/L (ref 22–32)
Calcium: 8 mg/dL — ABNORMAL LOW (ref 8.9–10.3)
Chloride: 101 mmol/L (ref 98–111)
Creatinine, Ser: 1.1 mg/dL (ref 0.61–1.24)
GFR, Estimated: >60 mL/min (ref >60)
Glucose, Bld: 117 mg/dL — ABNORMAL HIGH (ref 70–99)
Potassium: 3.6 mmol/L (ref 3.5–5.1)
Sodium: 138 mmol/L (ref 135–145)

## 2023-05-04 MED ORDER — TAMSULOSIN HCL 0.4 MG PO CAPS
0.4000 mg | ORAL_CAPSULE | Freq: Every day | ORAL | Status: DC
  Administered 2023-05-04: 0.4 mg via ORAL
  Filled 2023-05-04: qty 1

## 2023-05-04 MED ORDER — AMOXICILLIN-POT CLAVULANATE 875-125 MG PO TABS
1.0000 | ORAL_TABLET | Freq: Two times a day (BID) | ORAL | Status: DC
  Administered 2023-05-04 – 2023-05-05 (×3): 1 via ORAL
  Filled 2023-05-04 (×3): qty 1

## 2023-05-04 NOTE — Plan of Care (Signed)
  Problem: Education: Goal: Knowledge of disease or condition will improve Outcome: Progressing Goal: Knowledge of secondary prevention will improve (MUST DOCUMENT ALL) Outcome: Progressing Goal: Knowledge of patient specific risk factors will improve Loraine Leriche N/A or DELETE if not current risk factor) Outcome: Progressing   Problem: Intracerebral Hemorrhage Tissue Perfusion: Goal: Complications of Intracerebral Hemorrhage will be minimized Outcome: Progressing   Problem: Coping: Goal: Will verbalize positive feelings about self Outcome: Progressing Goal: Will identify appropriate support needs Outcome: Progressing   Problem: Health Behavior/Discharge Planning: Goal: Ability to manage health-related needs will improve Outcome: Progressing Goal: Goals will be collaboratively established with patient/family Outcome: Progressing   Problem: Self-Care: Goal: Ability to participate in self-care as condition permits will improve Outcome: Progressing Goal: Verbalization of feelings and concerns over difficulty with self-care will improve Outcome: Progressing Goal: Ability to communicate needs accurately will improve Outcome: Progressing   Problem: Nutrition: Goal: Risk of aspiration will decrease Outcome: Progressing Goal: Dietary intake will improve Outcome: Progressing   Problem: Health Behavior/Discharge Planning: Goal: Ability to manage health-related needs will improve Outcome: Progressing   Problem: Clinical Measurements: Goal: Ability to maintain clinical measurements within normal limits will improve Outcome: Progressing Goal: Will remain free from infection Outcome: Progressing Goal: Diagnostic test results will improve Outcome: Progressing Goal: Respiratory complications will improve Outcome: Progressing Goal: Cardiovascular complication will be avoided Outcome: Progressing   Problem: Activity: Goal: Risk for activity intolerance will decrease Outcome:  Progressing   Problem: Nutrition: Goal: Adequate nutrition will be maintained Outcome: Progressing   Problem: Coping: Goal: Level of anxiety will decrease Outcome: Progressing   Problem: Elimination: Goal: Will not experience complications related to bowel motility Outcome: Progressing Goal: Will not experience complications related to urinary retention Outcome: Progressing   Problem: Pain Managment: Goal: General experience of comfort will improve Outcome: Progressing   Problem: Safety: Goal: Ability to remain free from injury will improve Outcome: Progressing

## 2023-05-04 NOTE — Progress Notes (Signed)
HOSPITALIST ROUNDING NOTE Mason [name] [id number]  DOB: 10-23-1945  DOA: 04/25/2023  PCP: Joaquim Nam, MD  05/04/2023,12:13 PM   LOS: 9 days      Code Status: Full code   From: Home  current Dispo: Skilled eventually     77 year old white male home dwelling A-fib CHADVASC >4 on Xarelto, diverticulosis, anxiety and depression Portal venous thrombosis with unclear etiology hematology consulted GI consulted and was supposed to follow-up with Dr. Orlie Dakin in 2021 Underlying A-fib diagnosed 2021 Alcohol dependency  Admitted by neurology 04/25/2023 to the ICU with left-sided subcortical hemorrhage-reversed with Andexxa CTA neck 6 mm ulceration aortic arch CTA chest to segmental right upper lobe pulmonary artery suspicious for acute PE [later this was felt to be insignificant by pulmonary critical care who over read the CT) MRI brain 2 x 2.3 x 3.0 cm 7 mm intraparenchymal hemorrhage with vasogenic edema Bilateral duplex negative While on neurology service he stabilized was placed on dysphagia 2 diet but had a white count Hospitalist service assumed care 04/30/2023  9/10 renal ultrasound negative for hydronephrosis, CXR atelectasis versus pneumonia  Plan  Leukocytosis, concern for right-sided pneumonia dysphagia 2 diet with thin liquids UA performed 9/10 not really significant for UTI--UC and BC x 2 pending incentive spirometry every 2 hours --narrow Unasyn to Augmentin Family declined CIR-eventual skilled when stable  Dysphagia with probable aspiration-see above  Interested thalamic subcortical ICH Xarelto reversed with Andexxa on arrival Needs reeval with neurology in 8 weeks with regards to anticoagulation  Hypertension Goal blood pressure with ICH is less than 160-amlodipine 10 labetalol 20 every 2 as needed blood pressure above 160 and losartan 25 Add low-dose metoprolol 12.5 XL if worsening control  Impaired glucose tol, A1c 6.0 No meds at d/c for now--re-eval once able to  get OP labs  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 Smog enema given 9/9 with good result now holding --good BM yesterday Started requiring catheterization on 9/9-has indwelling Foley-clamping trial as able  A-fib CHADVASC >4 No anticoagulation for now  Monitor trends on telemetry  Discussed with sone Obinna at bedsdie  DVT prophylaxis: Heparin only  Status is: Inpatient Remains inpatient appropriate because:   Stabilizing--likely SNF in 24-48 H    Subjective:  Overall looks better not on oxygen No cp--mild cough  Eating some  Objective + exam Vitals:   05/04/23 0005 05/04/23 0339 05/04/23 0736 05/04/23 1121  BP: 126/75 125/76 (!) 144/82 132/84  Pulse: 82 88 82 82  Resp:  16 15 17   Temp: 99.4 F (37.4 C) 98.6 F (37 C) 98.9 F (37.2 C) 97.7 F (36.5 C)  TempSrc: Oral Oral Oral Oral  SpO2: 94% 95% 95% 97%  Weight:      Height:       Filed Weights   04/25/23 2106 05/02/23 1100  Weight: 81.8 kg 81.8 kg    Examination:  Dense right-sided deficit cannot raise right arm  Able to plantar flex R toe S1 s2 irreg at times with PVC on monitor Abd soft nt nd no rebound Dense R sided deficit with droop R side face  Data Reviewed: reviewed   CBC    Component Value Date/Time   WBC 18.1 (H) 05/04/2023 0729   RBC 5.87 (H) 05/04/2023 0729   HGB 17.0 05/04/2023 0729   HCT 51.4 05/04/2023 0729   PLT 472 (H) 05/04/2023 0729   MCV 87.6 05/04/2023 0729   MCH 29.0 05/04/2023 0729   MCHC 33.1 05/04/2023 0729   RDW  17.9 (H) 05/04/2023 0729   LYMPHSABS 0.9 05/04/2023 0729   MONOABS 0.5 05/04/2023 0729   EOSABS 0.7 (H) 05/04/2023 0729   BASOSABS 0.1 05/04/2023 0729      Latest Ref Rng & Units 05/04/2023    7:29 AM 05/03/2023    8:42 AM 05/02/2023    8:00 AM  CMP  Glucose 70 - 99 mg/dL 829  562  130   BUN 8 - 23 mg/dL 36  40  29   Creatinine 0.61 - 1.24 mg/dL 8.65  7.84  6.96   Sodium 135 - 145 mmol/L 138  137  137   Potassium 3.5 - 5.1 mmol/L 3.6   4.5  4.4   Chloride 98 - 111 mmol/L 101  102  101   CO2 22 - 32 mmol/L 28  23  24    Calcium 8.9 - 10.3 mg/dL 8.0  8.5  8.6   Total Protein 6.5 - 8.1 g/dL  6.7  7.2   Total Bilirubin 0.3 - 1.2 mg/dL  1.1  1.2   Alkaline Phos 38 - 126 U/L  79  75   AST 15 - 41 U/L  29  31   ALT 0 - 44 U/L  65  70      Scheduled Meds:  amLODipine  10 mg Oral Daily   amoxicillin-clavulanate  1 tablet Oral Q12H   Chlorhexidine Gluconate Cloth  6 each Topical Daily   docusate sodium  100 mg Oral BID   fluticasone  1 spray Each Nare Daily   heparin injection (subcutaneous)  5,000 Units Subcutaneous Q8H   latanoprost  1 drop Both Eyes QHS   losartan  25 mg Oral Daily   mouth rinse  15 mL Mouth Rinse 4 times per day   pantoprazole  40 mg Oral QHS   polyethylene glycol  17 g Oral BID   senna-docusate  1 tablet Oral QHS   tamsulosin  0.4 mg Oral QPC supper   timolol  1 drop Both Eyes q morning   Continuous Infusions:    Time  40  Rhetta Mura, MD  [facility]

## 2023-05-04 NOTE — Progress Notes (Addendum)
Speech Language Pathology Treatment: Dysphagia;Cognitive-Linquistic  Patient Details Name: Mason [name] MRN: [id number] DOB: 1946/07/21 Today's Date: 05/04/2023 Time: 1535-1610 SLP Time Calculation (min) (ACUTE ONLY): 35 min  Assessment / Plan / Recommendation Clinical Impression  Dysphagia Pt seen by SLP for diet toleration check and compensatory strategy training. Pt observed with thin liquid via straw. He required minimal to moderate cueing to utilize chin-tuck maneuver. Visual demonstration with verbal explanation of how to execute the maneuver was most effective in eliciting proper utilization of chin-tuck. One instance of delayed coughing was observed following a PO with improper use of chin-tuck. Pt's cough seemed stronger today than previously. SLP provided pt and his son with explanation of why chin-tuck maneuver is important for airway protection. Both pt and his son demonstrated understanding. Continue dys 2, thin liquid diets and provide continued patient education as needed. SLP will f/u to ensure compensatory strategy is being effectively used with POs.  Cognitive-Linguistic Pt seen by SLP for aphasia treatment focused on object recall and communicative strategies. He continues to have deficits in word finding with confrontational naming tasks. Single word intelligibility >80% intelligible, phrase and sentence level less than 50%. Pt is aware of and frustrated with his deficits. 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 Confrontational naming task with tactile objects was approximately 80% accurate with moderate cues. Most effective cues were descriptive and phonemic. Repetition of objects appeared to improve recall and speech accuracy. SLP provided pt and family education on the speech and language impairments to expect post stroke. Pt was appeared motivated to begin to make gains through ST. Communicative strategies (describing, acting out, etc.)  to increase communicative success were introduced. Pt and his son verbally demonstrated understanding. Pt would continue to benefit from communicative strategy treatment and communication partner training. ST will continue to follow and provide ST focused on word finding, speech intelligibility, and communicative strategies.    HPI HPI: Pt is a 77 y.o. male who presneted on 04/25/23 with right sided weakness and difficulty speaking. CT head revealed acute intraparenchymal hematoma at the left thalamic capsular region.   Significant PMH includes diverticulitius, GERD, A-fib.      SLP Plan  Continue with current plan of care      Recommendations for follow up therapy are one component of a multi-disciplinary discharge planning process, led by the attending physician.  Recommendations may be updated based on patient status, additional functional criteria and insurance authorization.    Recommendations  Diet recommendations: Dysphagia 2 (fine chop);Thin liquid Liquids provided via: Straw;Cup Medication Administration: Whole meds with puree Supervision: Staff to assist with self feeding;Full supervision/cueing for compensatory strategies Compensations: Slow rate;Small sips/bites;Minimize environmental distractions;Monitor for anterior loss;Chin tuck Postural Changes and/or Swallow Maneuvers: Seated upright 90 degrees                  Oral care BID;Staff/trained caregiver to provide oral care   Intermittent Supervision/Assistance Dysphagia, oropharyngeal phase (R13.12);Aphasia (R47.01)     Continue with current plan of care     Marline Backbone, B.S., Speech Therapy Student

## 2023-05-04 NOTE — Progress Notes (Signed)
Urine collect orders from 9/3 and 9/10 that were not completed discussed with Dr. Mahala Menghini. Will cancel collection at this time per conversation.

## 2023-05-04 NOTE — TOC Progression Note (Signed)
Transition of Care [facility]) - Progression Note    Patient Details  Name: Mason [name] MRN: [id number] Date of Birth: 04/20/1946  Transition of Care [facility]) CM/SW Contact  Baldemar Lenis, [place] Phone Number: 05/04/2023, 2:52 PM  Clinical Narrative:   CSW received email after hours yesterday that [facility] didn't have a bed available, but they had offered a bed for the patient in the HUB. CSW attempted to contact [facility] multiple times today to get a clear answer, unable to get an answer. CSW met with son at bedside to provide update and other bed offers, as well as other SNFs closest to the patient's friend in [place]. Family to review, will update CSW. CSW to follow.    Expected Discharge Plan: Skilled Nursing Facility Barriers to Discharge: Continued Medical Work up  Expected Discharge Plan and Services In-house Referral: Clinical Social Work   Post Acute Care Choice: NA Living arrangements for the past 2 months: Single Family Home                                       Social Determinants of Health (SDOH) Interventions SDOH Screenings   Food Insecurity: No Food Insecurity (05/02/2023)  Housing: Low Risk  (05/02/2023)  Transportation Needs: No Transportation Needs (05/02/2023)  Utilities: Not At Risk (05/02/2023)  Alcohol Screen: Low Risk  (07/08/2021)  Depression (PHQ2-9): Low Risk  (04/14/2023)  Financial Resource Strain: Low Risk  (07/20/2022)  Physical Activity: Insufficiently Active (07/20/2022)  Social Connections: Socially Isolated (07/20/2022)  Stress: No Stress Concern Present (07/20/2022)  Tobacco Use: Medium Risk (05/02/2023)    Readmission Risk Interventions     No data to display

## 2023-05-05 DIAGNOSIS — I63429 Cerebral infarction due to embolism of unspecified anterior cerebral artery: Secondary | ICD-10-CM | POA: Diagnosis not present

## 2023-05-05 LAB — CBC WITH DIFFERENTIAL/PLATELET
Abs Immature Granulocytes: 0.17 10*3/uL — ABNORMAL HIGH (ref 0.00–0.07)
Basophils Absolute: 0.1 10*3/uL (ref 0.0–0.1)
Basophils Relative: 1 %
Eosinophils Absolute: 0.7 10*3/uL — ABNORMAL HIGH (ref 0.0–0.5)
Eosinophils Relative: 5 %
HCT: 47.1 % (ref 39.0–52.0)
Hemoglobin: 15.3 g/dL (ref 13.0–17.0)
Immature Granulocytes: 1 %
Lymphocytes Relative: 8 %
Lymphs Abs: 1.2 10*3/uL (ref 0.7–4.0)
MCH: 28 pg (ref 26.0–34.0)
MCHC: 32.5 g/dL (ref 30.0–36.0)
MCV: 86.1 fL (ref 80.0–100.0)
Monocytes Absolute: 0.7 10*3/uL (ref 0.1–1.0)
Monocytes Relative: 5 %
Neutro Abs: 12.2 10*3/uL — ABNORMAL HIGH (ref 1.7–7.7)
Neutrophils Relative %: 80 %
Platelets: 458 10*3/uL — ABNORMAL HIGH (ref 150–400)
RBC: 5.47 MIL/uL (ref 4.22–5.81)
RDW: 16.8 % — ABNORMAL HIGH (ref 11.5–15.5)
WBC: 15.1 10*3/uL — ABNORMAL HIGH (ref 4.0–10.5)
nRBC: 0 % (ref 0.0–0.2)

## 2023-05-05 LAB — BASIC METABOLIC PANEL
Anion gap: 9 (ref 5–15)
BUN: 32 mg/dL — ABNORMAL HIGH (ref 8–23)
CO2: 24 mmol/L (ref 22–32)
Calcium: 8.1 mg/dL — ABNORMAL LOW (ref 8.9–10.3)
Chloride: 101 mmol/L (ref 98–111)
Creatinine, Ser: 1.05 mg/dL (ref 0.61–1.24)
GFR, Estimated: >60 mL/min (ref >60)
Glucose, Bld: 96 mg/dL (ref 70–99)
Potassium: 3.8 mmol/L (ref 3.5–5.1)
Sodium: 134 mmol/L — ABNORMAL LOW (ref 135–145)

## 2023-05-05 MED ORDER — POLYETHYLENE GLYCOL 3350 17 G PO PACK: 17.0000 g | PACK | Freq: Two times a day (BID) | ORAL | Status: AC

## 2023-05-05 MED ORDER — ALUM & MAG HYDROXIDE-SIMETH 200-200-20 MG/5ML PO SUSP: 30.0000 mL | ORAL | Status: DC | PRN

## 2023-05-05 MED ORDER — TAMSULOSIN HCL 0.4 MG PO CAPS: 0.4000 mg | ORAL_CAPSULE | Freq: Every day | ORAL | Status: AC

## 2023-05-05 MED ORDER — AMOXICILLIN-POT CLAVULANATE 875-125 MG PO TABS: 1.0000 | ORAL_TABLET | Freq: Two times a day (BID) | ORAL | Status: AC

## 2023-05-05 MED ORDER — INFLUENZA VAC A&B SURF ANT ADJ 0.5 ML IM SUSY
0.5000 mL | PREFILLED_SYRINGE | Freq: Once | INTRAMUSCULAR | Status: AC
  Administered 2023-05-05: 0.5 mL via INTRAMUSCULAR
  Filled 2023-05-05: qty 0.5

## 2023-05-05 MED ORDER — AMLODIPINE BESYLATE 10 MG PO TABS: 10.0000 mg | ORAL_TABLET | Freq: Every day | ORAL | Status: DC

## 2023-05-05 MED ORDER — LOSARTAN POTASSIUM 25 MG PO TABS: 25.0000 mg | ORAL_TABLET | Freq: Every day | ORAL | Status: DC

## 2023-05-05 MED ORDER — SENNOSIDES-DOCUSATE SODIUM 8.6-50 MG PO TABS: 1.0000 | ORAL_TABLET | Freq: Every evening | ORAL | Status: DC | PRN

## 2023-05-05 MED ORDER — FLUTICASONE PROPIONATE 50 MCG/ACT NA SUSP: 1.0000 | Freq: Every day | NASAL | Status: DC

## 2023-05-05 MED ORDER — MELATONIN 3 MG PO TABS: 3.0000 mg | ORAL_TABLET | Freq: Every evening | ORAL | Status: AC | PRN

## 2023-05-05 NOTE — Discharge Summary (Signed)
Allergy Shortness Of Breath, Rash, Other (See Comments)   GI upset Difficulty breathing Reaction to shrimp and crab   Ak-mycin [erythromycin] Other (See Comments)   GI upset   Celexa [citalopram] Other (See Comments)   Aggression    Levsin [hyoscyamine] Other (See Comments)   Urinary retention    Librax [chlordiazepoxide-clidinium] Other (See Comments)   Urinary retention   Mobic [meloxicam] Other (See Comments)   GI upset        Medication List     STOP taking these medications    azelastine 0.1 % nasal spray Commonly known as: ASTELIN   levocetirizine 5 MG tablet Commonly known as: XYZAL   mometasone 0.1 % cream Commonly known as: ELOCON   predniSONE 10 MG tablet Commonly known as: DELTASONE   rivaroxaban 20 MG Tabs tablet Commonly known as: XARELTO       TAKE these medications    amLODipine 10 MG tablet Commonly known as: NORVASC Take 1 tablet (10 mg total) by mouth daily.   amoxicillin-clavulanate 875-125 MG tablet Commonly known as: AUGMENTIN Take 1 tablet by mouth every 12 (twelve) hours for 4 days.   ascorbic acid 500 MG tablet Commonly known as: VITAMIN C Take 500 mg by mouth daily.   b complex vitamins capsule Take 1 capsule by mouth daily.   bimatoprost 0.01 % Soln Commonly known as: LUMIGAN Place 1 drop into both eyes at bedtime.   esomeprazole 40 MG capsule Commonly known as: NEXIUM Take 1 capsule by mouth twice daily   fluticasone 50 MCG/ACT nasal spray Commonly known as: FLONASE Place 1 spray into both nostrils daily.   losartan 25 MG tablet Commonly known as: COZAAR Take 1 tablet (25 mg total) by mouth daily.   melatonin 3 MG Tabs tablet Take 1 tablet (3 mg total) by mouth at bedtime as needed.   polyethylene glycol 17 g packet Commonly known as: MIRALAX / GLYCOLAX Take 17 g by mouth 2 (two) times daily.   senna-docusate 8.6-50 MG tablet Commonly known as: Senokot-S Take 1 tablet by mouth at bedtime as needed for mild constipation.   tamsulosin 0.4 MG Caps capsule Commonly known as: FLOMAX Take 1 capsule (0.4 mg total) by mouth daily after supper. What changed: See the new instructions.   timolol 0.5 % ophthalmic gel-forming Commonly known as: TIMOPTIC-XR Place 1 drop into both eyes every morning.   Vitamin  D 125 MCG (5000 UT) Caps Take 1 tablet by mouth daily.   zinc gluconate 50 MG tablet Take 50 mg by mouth daily.       Allergies  Allergen Reactions   Shellfish Allergy Shortness Of Breath, Rash and Other (See Comments)    GI upset Difficulty breathing  Reaction to shrimp and crab   Ak-Mycin [Erythromycin] Other (See Comments)    GI upset   Celexa [Citalopram] Other (See Comments)    Aggression    Levsin [Hyoscyamine] Other (See Comments)    Urinary retention   Librax [Chlordiazepoxide-Clidinium] Other (See Comments)    Urinary retention   Mobic [Meloxicam] Other (See Comments)    GI upset      The results of significant diagnostics from this hospitalization (including imaging, microbiology, ancillary and laboratory) are listed below for reference.    Significant Diagnostic Studies: DG Swallowing Func-Speech Pathology  Result Date: 05/03/2023 Table formatting from the original result was not included. Modified Barium Swallow Study Patient Details Name: BROEDY MOGG MRN: [id number] Date of Birth: 05/24/1946 Today's Date: 05/03/2023 HPI/PMH: HPI:  Allergy Shortness Of Breath, Rash, Other (See Comments)   GI upset Difficulty breathing Reaction to shrimp and crab   Ak-mycin [erythromycin] Other (See Comments)   GI upset   Celexa [citalopram] Other (See Comments)   Aggression    Levsin [hyoscyamine] Other (See Comments)   Urinary retention    Librax [chlordiazepoxide-clidinium] Other (See Comments)   Urinary retention   Mobic [meloxicam] Other (See Comments)   GI upset        Medication List     STOP taking these medications    azelastine 0.1 % nasal spray Commonly known as: ASTELIN   levocetirizine 5 MG tablet Commonly known as: XYZAL   mometasone 0.1 % cream Commonly known as: ELOCON   predniSONE 10 MG tablet Commonly known as: DELTASONE   rivaroxaban 20 MG Tabs tablet Commonly known as: XARELTO       TAKE these medications    amLODipine 10 MG tablet Commonly known as: NORVASC Take 1 tablet (10 mg total) by mouth daily.   amoxicillin-clavulanate 875-125 MG tablet Commonly known as: AUGMENTIN Take 1 tablet by mouth every 12 (twelve) hours for 4 days.   ascorbic acid 500 MG tablet Commonly known as: VITAMIN C Take 500 mg by mouth daily.   b complex vitamins capsule Take 1 capsule by mouth daily.   bimatoprost 0.01 % Soln Commonly known as: LUMIGAN Place 1 drop into both eyes at bedtime.   esomeprazole 40 MG capsule Commonly known as: NEXIUM Take 1 capsule by mouth twice daily   fluticasone 50 MCG/ACT nasal spray Commonly known as: FLONASE Place 1 spray into both nostrils daily.   losartan 25 MG tablet Commonly known as: COZAAR Take 1 tablet (25 mg total) by mouth daily.   melatonin 3 MG Tabs tablet Take 1 tablet (3 mg total) by mouth at bedtime as needed.   polyethylene glycol 17 g packet Commonly known as: MIRALAX / GLYCOLAX Take 17 g by mouth 2 (two) times daily.   senna-docusate 8.6-50 MG tablet Commonly known as: Senokot-S Take 1 tablet by mouth at bedtime as needed for mild constipation.   tamsulosin 0.4 MG Caps capsule Commonly known as: FLOMAX Take 1 capsule (0.4 mg total) by mouth daily after supper. What changed: See the new instructions.   timolol 0.5 % ophthalmic gel-forming Commonly known as: TIMOPTIC-XR Place 1 drop into both eyes every morning.   Vitamin  D 125 MCG (5000 UT) Caps Take 1 tablet by mouth daily.   zinc gluconate 50 MG tablet Take 50 mg by mouth daily.       Allergies  Allergen Reactions   Shellfish Allergy Shortness Of Breath, Rash and Other (See Comments)    GI upset Difficulty breathing  Reaction to shrimp and crab   Ak-Mycin [Erythromycin] Other (See Comments)    GI upset   Celexa [Citalopram] Other (See Comments)    Aggression    Levsin [Hyoscyamine] Other (See Comments)    Urinary retention   Librax [Chlordiazepoxide-Clidinium] Other (See Comments)    Urinary retention   Mobic [Meloxicam] Other (See Comments)    GI upset      The results of significant diagnostics from this hospitalization (including imaging, microbiology, ancillary and laboratory) are listed below for reference.    Significant Diagnostic Studies: DG Swallowing Func-Speech Pathology  Result Date: 05/03/2023 Table formatting from the original result was not included. Modified Barium Swallow Study Patient Details Name: BROEDY MOGG MRN: [id number] Date of Birth: 05/24/1946 Today's Date: 05/03/2023 HPI/PMH: HPI:  64-66) suspicious for acute pulmonary embolism. However there is some motion in this area and this could be artifactual. No evidence of right heart strain. Ventricular caliber is similar to 09/20/2019. Cardiomegaly. No pericardial effusion No aortic aneurysm or dissection. There is a 6 mm oval nodule along the medial wall of the aortic arch (series 5/image 66). Differential considerations include a mural thrombus small intimal flap. There is moderate narrowing of the proximal left subclavian artery (series 5/image 36) secondary to low-density atherosclerotic plaque or mural thrombus. This has increased from CT 09/20/2019. Mediastinum/Nodes: Debris within the right posterior trachea. Unremarkable esophagus. No thoracic adenopathy. Lungs/Pleura: Lungs are clear. Mild diffuse bronchial wall thickening. No pleural effusion or pneumothorax. Upper Abdomen: No acute abnormality. Vicarious excretion of contrast in the bladder. Musculoskeletal: No acute fracture. Review of the MIP images confirms the above findings. IMPRESSION: 1. Nonocclusive filling defects in two segmental right upper lobe pulmonary arteries suspicious for acute pulmonary embolism. There is some motion in this area and this could be artifactual. Consider lower extremity venous ultrasound to look for possible source. 2. There is a 6 mm oval nodule along the medial wall of the aortic arch. Differential considerations include a mural thrombus or small intimal flap. 3. There is moderate narrowing of the proximal left subclavian artery secondary to low-density atherosclerotic plaque or mural  thrombus. This has increased from CT 09/20/2019. 4. Debris within the trachea. Critical Value/emergent results were called by telephone at the time of interpretation on 04/26/2023 at 11:48 pm to provider Dr. Amada Jupiter, who verbally acknowledged these results. Electronically Signed   By: Minerva Fester M.D.   On: 04/27/2023 00:00   ECHOCARDIOGRAM COMPLETE  Result Date: 04/26/2023    ECHOCARDIOGRAM REPORT   Patient Name:   MALONE LAVERY [facility] Date of Exam: 04/26/2023 Medical Rec #:  [id number]         Height:       69.5 in Accession #:    [id number]        Weight:       180.3 lb Date of Birth:  1945-12-27        BSA:          1.987 m Patient Age:    32 years          BP:           128/81 mmHg Patient Gender: M                 HR:           69 bpm. Exam Location:  Inpatient Procedure: 2D Echo, Cardiac Doppler and Color Doppler Indications:    Stroke I63.9  History:        Patient has prior history of Echocardiogram examinations, most                 recent 10/14/2022. Cancer, Signs/Symptoms:Altered Mental Status;                 Risk Factors:Former Smoker.  Sonographer:    Aron Baba Referring Phys: [phone] MCNEILL P KIRKPATRICK  Sonographer Comments: Suboptimal subcostal window. Image acquisition challenging due to respiratory motion. IMPRESSIONS  1. Left ventricular ejection fraction, by estimation, is 60 to 65%. The left ventricle has normal function. The left ventricle has no regional wall motion abnormalities. There is mild left ventricular hypertrophy. Left ventricular diastolic parameters are consistent with Grade I diastolic dysfunction (impaired relaxation).  2. Right ventricular systolic function is normal. The right ventricular size is normal.  64-66) suspicious for acute pulmonary embolism. However there is some motion in this area and this could be artifactual. No evidence of right heart strain. Ventricular caliber is similar to 09/20/2019. Cardiomegaly. No pericardial effusion No aortic aneurysm or dissection. There is a 6 mm oval nodule along the medial wall of the aortic arch (series 5/image 66). Differential considerations include a mural thrombus small intimal flap. There is moderate narrowing of the proximal left subclavian artery (series 5/image 36) secondary to low-density atherosclerotic plaque or mural thrombus. This has increased from CT 09/20/2019. Mediastinum/Nodes: Debris within the right posterior trachea. Unremarkable esophagus. No thoracic adenopathy. Lungs/Pleura: Lungs are clear. Mild diffuse bronchial wall thickening. No pleural effusion or pneumothorax. Upper Abdomen: No acute abnormality. Vicarious excretion of contrast in the bladder. Musculoskeletal: No acute fracture. Review of the MIP images confirms the above findings. IMPRESSION: 1. Nonocclusive filling defects in two segmental right upper lobe pulmonary arteries suspicious for acute pulmonary embolism. There is some motion in this area and this could be artifactual. Consider lower extremity venous ultrasound to look for possible source. 2. There is a 6 mm oval nodule along the medial wall of the aortic arch. Differential considerations include a mural thrombus or small intimal flap. 3. There is moderate narrowing of the proximal left subclavian artery secondary to low-density atherosclerotic plaque or mural  thrombus. This has increased from CT 09/20/2019. 4. Debris within the trachea. Critical Value/emergent results were called by telephone at the time of interpretation on 04/26/2023 at 11:48 pm to provider Dr. Amada Jupiter, who verbally acknowledged these results. Electronically Signed   By: Minerva Fester M.D.   On: 04/27/2023 00:00   ECHOCARDIOGRAM COMPLETE  Result Date: 04/26/2023    ECHOCARDIOGRAM REPORT   Patient Name:   MALONE LAVERY [facility] Date of Exam: 04/26/2023 Medical Rec #:  [id number]         Height:       69.5 in Accession #:    [id number]        Weight:       180.3 lb Date of Birth:  1945-12-27        BSA:          1.987 m Patient Age:    32 years          BP:           128/81 mmHg Patient Gender: M                 HR:           69 bpm. Exam Location:  Inpatient Procedure: 2D Echo, Cardiac Doppler and Color Doppler Indications:    Stroke I63.9  History:        Patient has prior history of Echocardiogram examinations, most                 recent 10/14/2022. Cancer, Signs/Symptoms:Altered Mental Status;                 Risk Factors:Former Smoker.  Sonographer:    Aron Baba Referring Phys: [phone] MCNEILL P KIRKPATRICK  Sonographer Comments: Suboptimal subcostal window. Image acquisition challenging due to respiratory motion. IMPRESSIONS  1. Left ventricular ejection fraction, by estimation, is 60 to 65%. The left ventricle has normal function. The left ventricle has no regional wall motion abnormalities. There is mild left ventricular hypertrophy. Left ventricular diastolic parameters are consistent with Grade I diastolic dysfunction (impaired relaxation).  2. Right ventricular systolic function is normal. The right ventricular size is normal.  64-66) suspicious for acute pulmonary embolism. However there is some motion in this area and this could be artifactual. No evidence of right heart strain. Ventricular caliber is similar to 09/20/2019. Cardiomegaly. No pericardial effusion No aortic aneurysm or dissection. There is a 6 mm oval nodule along the medial wall of the aortic arch (series 5/image 66). Differential considerations include a mural thrombus small intimal flap. There is moderate narrowing of the proximal left subclavian artery (series 5/image 36) secondary to low-density atherosclerotic plaque or mural thrombus. This has increased from CT 09/20/2019. Mediastinum/Nodes: Debris within the right posterior trachea. Unremarkable esophagus. No thoracic adenopathy. Lungs/Pleura: Lungs are clear. Mild diffuse bronchial wall thickening. No pleural effusion or pneumothorax. Upper Abdomen: No acute abnormality. Vicarious excretion of contrast in the bladder. Musculoskeletal: No acute fracture. Review of the MIP images confirms the above findings. IMPRESSION: 1. Nonocclusive filling defects in two segmental right upper lobe pulmonary arteries suspicious for acute pulmonary embolism. There is some motion in this area and this could be artifactual. Consider lower extremity venous ultrasound to look for possible source. 2. There is a 6 mm oval nodule along the medial wall of the aortic arch. Differential considerations include a mural thrombus or small intimal flap. 3. There is moderate narrowing of the proximal left subclavian artery secondary to low-density atherosclerotic plaque or mural  thrombus. This has increased from CT 09/20/2019. 4. Debris within the trachea. Critical Value/emergent results were called by telephone at the time of interpretation on 04/26/2023 at 11:48 pm to provider Dr. Amada Jupiter, who verbally acknowledged these results. Electronically Signed   By: Minerva Fester M.D.   On: 04/27/2023 00:00   ECHOCARDIOGRAM COMPLETE  Result Date: 04/26/2023    ECHOCARDIOGRAM REPORT   Patient Name:   MALONE LAVERY [facility] Date of Exam: 04/26/2023 Medical Rec #:  [id number]         Height:       69.5 in Accession #:    [id number]        Weight:       180.3 lb Date of Birth:  1945-12-27        BSA:          1.987 m Patient Age:    32 years          BP:           128/81 mmHg Patient Gender: M                 HR:           69 bpm. Exam Location:  Inpatient Procedure: 2D Echo, Cardiac Doppler and Color Doppler Indications:    Stroke I63.9  History:        Patient has prior history of Echocardiogram examinations, most                 recent 10/14/2022. Cancer, Signs/Symptoms:Altered Mental Status;                 Risk Factors:Former Smoker.  Sonographer:    Aron Baba Referring Phys: [phone] MCNEILL P KIRKPATRICK  Sonographer Comments: Suboptimal subcostal window. Image acquisition challenging due to respiratory motion. IMPRESSIONS  1. Left ventricular ejection fraction, by estimation, is 60 to 65%. The left ventricle has normal function. The left ventricle has no regional wall motion abnormalities. There is mild left ventricular hypertrophy. Left ventricular diastolic parameters are consistent with Grade I diastolic dysfunction (impaired relaxation).  2. Right ventricular systolic function is normal. The right ventricular size is normal.  MR Peak grad: 2.7 mmHg MR Vmax:      82.50 cm/s    SHUNTS MV E velocity: 62.70 cm/s   Systemic VTI:  0.21 m MV A velocity: 106.00 cm/s  Systemic Diam: 2.10 cm MV E/A ratio:  0.59 Carolan Clines Electronically signed by Carolan Clines Signature Date/Time: 04/26/2023/3:11:35 PM    Final    DG Swallowing Func-Speech Pathology  Result Date: 04/26/2023 Table formatting from the original result was not included. Modified Barium Swallow Study Patient Details Name: HAROLD BEEKS MRN: [id number] Date of Birth: 1946-06-12 Today's Date: 04/26/2023 HPI/PMH: HPI: Pt is a 77 y.o. male who presneted on 04/25/23 with right sided weakness and difficulty speaking. CT head revealed acute intraparenchymal hematoma at the left thalamic capsular region.   Significant PMH includes diverticulitius, GERD, A-fib. Clinical Impression: Clinical Impression: Pt seen by SLP for modified barium swallow study following bedside swallow evaluation post stroke. Pt presents with a moderate oropharyngeal dysphagia primarily chracterised by mistiming and coordination of epiglottic inversion and laryngeal vestibule closure resulting in silent aspiration of thin liquids. Dysfunction of coordination can likely be attributed to impairment of motor planning and  sensation of swallowing mechanisms as a result of stroke. Gross silent aspiration and penetration occured during thin and nectar consistency adminstration. Mistiming of epiglottic inversion resulted in more than 50% bolus entering airway. Aspiration did not ellicit cough response. When cued to cough, aspirate did eject from airway. No aspiration observed with honey thick, puree, or solid consistencies. Belching and hiccuping observed at end of study, which can be attributted to pt hx of GERD. Chin tuck manuever effectively prevented bolus from entering airway with nectar thick, but penetration did occur. Pt did not effectievly utilize chin tuck, as he would untuck his chin when swallow was initiated. Chin tuck will liekly be an effective compensatory strategy given cognitive status improves. SLP reccommendation of dys 2 (minced) and honey thick liquid. ST will continue to f/u to ensure diet toleration, use of compensatory strategies, and upgraded PO trials. Factors that may increase risk of adverse event in presence of aspiration Rubye Oaks & Clearance Coots 2021): Factors that may increase risk of adverse event in presence of aspiration Rubye Oaks & Clearance Coots 2021): Reduced cognitive function Recommendations/Plan: Swallowing Evaluation Recommendations Swallowing Evaluation Recommendations Recommendations: PO diet PO Diet Recommendation: Dysphagia 2 (Finely chopped); Moderately thick liquids (Level 3, honey thick) Liquid Administration via: Spoon; Cup Medication Administration: Whole meds with puree Supervision: Full supervision/cueing for swallowing strategies; Full assist for feeding Swallowing strategies  : Slow rate; Small bites/sips; Chin tuck Postural changes: Position pt fully upright for meals Oral care recommendations: Oral care BID (2x/day); Staff/trained caregiver to provide oral care Treatment Plan Treatment Plan Treatment recommendations: Therapy as outlined in treatment plan below Follow-up recommendations: Acute  inpatient rehab (3 hours/day) Functional status assessment: Patient has had a recent decline in their functional status and demonstrates the ability to make significant improvements in function in a reasonable and predictable amount of time. Treatment frequency: Min 3x/week Treatment duration: 2 weeks Interventions: Compensatory techniques; Patient/family education; Trials of upgraded texture/liquids; Diet toleration management by SLP Recommendations Recommendations for follow up therapy are one component of a multi-disciplinary discharge planning process, led by the attending physician.  Recommendations may be updated based on patient status, additional functional criteria and insurance authorization. Assessment: Orofacial Exam: Orofacial Exam Oral Cavity: Oral Hygiene: WFL Oral Cavity - Dentition: Adequate natural dentition Orofacial Anatomy: WFL Oral Motor/Sensory Function: Suspected cranial nerve impairment CN V - Trigeminal: Right sensory impairment;  MR Peak grad: 2.7 mmHg MR Vmax:      82.50 cm/s    SHUNTS MV E velocity: 62.70 cm/s   Systemic VTI:  0.21 m MV A velocity: 106.00 cm/s  Systemic Diam: 2.10 cm MV E/A ratio:  0.59 Carolan Clines Electronically signed by Carolan Clines Signature Date/Time: 04/26/2023/3:11:35 PM    Final    DG Swallowing Func-Speech Pathology  Result Date: 04/26/2023 Table formatting from the original result was not included. Modified Barium Swallow Study Patient Details Name: HAROLD BEEKS MRN: [id number] Date of Birth: 1946-06-12 Today's Date: 04/26/2023 HPI/PMH: HPI: Pt is a 77 y.o. male who presneted on 04/25/23 with right sided weakness and difficulty speaking. CT head revealed acute intraparenchymal hematoma at the left thalamic capsular region.   Significant PMH includes diverticulitius, GERD, A-fib. Clinical Impression: Clinical Impression: Pt seen by SLP for modified barium swallow study following bedside swallow evaluation post stroke. Pt presents with a moderate oropharyngeal dysphagia primarily chracterised by mistiming and coordination of epiglottic inversion and laryngeal vestibule closure resulting in silent aspiration of thin liquids. Dysfunction of coordination can likely be attributed to impairment of motor planning and  sensation of swallowing mechanisms as a result of stroke. Gross silent aspiration and penetration occured during thin and nectar consistency adminstration. Mistiming of epiglottic inversion resulted in more than 50% bolus entering airway. Aspiration did not ellicit cough response. When cued to cough, aspirate did eject from airway. No aspiration observed with honey thick, puree, or solid consistencies. Belching and hiccuping observed at end of study, which can be attributted to pt hx of GERD. Chin tuck manuever effectively prevented bolus from entering airway with nectar thick, but penetration did occur. Pt did not effectievly utilize chin tuck, as he would untuck his chin when swallow was initiated. Chin tuck will liekly be an effective compensatory strategy given cognitive status improves. SLP reccommendation of dys 2 (minced) and honey thick liquid. ST will continue to f/u to ensure diet toleration, use of compensatory strategies, and upgraded PO trials. Factors that may increase risk of adverse event in presence of aspiration Rubye Oaks & Clearance Coots 2021): Factors that may increase risk of adverse event in presence of aspiration Rubye Oaks & Clearance Coots 2021): Reduced cognitive function Recommendations/Plan: Swallowing Evaluation Recommendations Swallowing Evaluation Recommendations Recommendations: PO diet PO Diet Recommendation: Dysphagia 2 (Finely chopped); Moderately thick liquids (Level 3, honey thick) Liquid Administration via: Spoon; Cup Medication Administration: Whole meds with puree Supervision: Full supervision/cueing for swallowing strategies; Full assist for feeding Swallowing strategies  : Slow rate; Small bites/sips; Chin tuck Postural changes: Position pt fully upright for meals Oral care recommendations: Oral care BID (2x/day); Staff/trained caregiver to provide oral care Treatment Plan Treatment Plan Treatment recommendations: Therapy as outlined in treatment plan below Follow-up recommendations: Acute  inpatient rehab (3 hours/day) Functional status assessment: Patient has had a recent decline in their functional status and demonstrates the ability to make significant improvements in function in a reasonable and predictable amount of time. Treatment frequency: Min 3x/week Treatment duration: 2 weeks Interventions: Compensatory techniques; Patient/family education; Trials of upgraded texture/liquids; Diet toleration management by SLP Recommendations Recommendations for follow up therapy are one component of a multi-disciplinary discharge planning process, led by the attending physician.  Recommendations may be updated based on patient status, additional functional criteria and insurance authorization. Assessment: Orofacial Exam: Orofacial Exam Oral Cavity: Oral Hygiene: WFL Oral Cavity - Dentition: Adequate natural dentition Orofacial Anatomy: WFL Oral Motor/Sensory Function: Suspected cranial nerve impairment CN V - Trigeminal: Right sensory impairment;  Allergy Shortness Of Breath, Rash, Other (See Comments)   GI upset Difficulty breathing Reaction to shrimp and crab   Ak-mycin [erythromycin] Other (See Comments)   GI upset   Celexa [citalopram] Other (See Comments)   Aggression    Levsin [hyoscyamine] Other (See Comments)   Urinary retention    Librax [chlordiazepoxide-clidinium] Other (See Comments)   Urinary retention   Mobic [meloxicam] Other (See Comments)   GI upset        Medication List     STOP taking these medications    azelastine 0.1 % nasal spray Commonly known as: ASTELIN   levocetirizine 5 MG tablet Commonly known as: XYZAL   mometasone 0.1 % cream Commonly known as: ELOCON   predniSONE 10 MG tablet Commonly known as: DELTASONE   rivaroxaban 20 MG Tabs tablet Commonly known as: XARELTO       TAKE these medications    amLODipine 10 MG tablet Commonly known as: NORVASC Take 1 tablet (10 mg total) by mouth daily.   amoxicillin-clavulanate 875-125 MG tablet Commonly known as: AUGMENTIN Take 1 tablet by mouth every 12 (twelve) hours for 4 days.   ascorbic acid 500 MG tablet Commonly known as: VITAMIN C Take 500 mg by mouth daily.   b complex vitamins capsule Take 1 capsule by mouth daily.   bimatoprost 0.01 % Soln Commonly known as: LUMIGAN Place 1 drop into both eyes at bedtime.   esomeprazole 40 MG capsule Commonly known as: NEXIUM Take 1 capsule by mouth twice daily   fluticasone 50 MCG/ACT nasal spray Commonly known as: FLONASE Place 1 spray into both nostrils daily.   losartan 25 MG tablet Commonly known as: COZAAR Take 1 tablet (25 mg total) by mouth daily.   melatonin 3 MG Tabs tablet Take 1 tablet (3 mg total) by mouth at bedtime as needed.   polyethylene glycol 17 g packet Commonly known as: MIRALAX / GLYCOLAX Take 17 g by mouth 2 (two) times daily.   senna-docusate 8.6-50 MG tablet Commonly known as: Senokot-S Take 1 tablet by mouth at bedtime as needed for mild constipation.   tamsulosin 0.4 MG Caps capsule Commonly known as: FLOMAX Take 1 capsule (0.4 mg total) by mouth daily after supper. What changed: See the new instructions.   timolol 0.5 % ophthalmic gel-forming Commonly known as: TIMOPTIC-XR Place 1 drop into both eyes every morning.   Vitamin  D 125 MCG (5000 UT) Caps Take 1 tablet by mouth daily.   zinc gluconate 50 MG tablet Take 50 mg by mouth daily.       Allergies  Allergen Reactions   Shellfish Allergy Shortness Of Breath, Rash and Other (See Comments)    GI upset Difficulty breathing  Reaction to shrimp and crab   Ak-Mycin [Erythromycin] Other (See Comments)    GI upset   Celexa [Citalopram] Other (See Comments)    Aggression    Levsin [Hyoscyamine] Other (See Comments)    Urinary retention   Librax [Chlordiazepoxide-Clidinium] Other (See Comments)    Urinary retention   Mobic [Meloxicam] Other (See Comments)    GI upset      The results of significant diagnostics from this hospitalization (including imaging, microbiology, ancillary and laboratory) are listed below for reference.    Significant Diagnostic Studies: DG Swallowing Func-Speech Pathology  Result Date: 05/03/2023 Table formatting from the original result was not included. Modified Barium Swallow Study Patient Details Name: BROEDY MOGG MRN: [id number] Date of Birth: 05/24/1946 Today's Date: 05/03/2023 HPI/PMH: HPI:  64-66) suspicious for acute pulmonary embolism. However there is some motion in this area and this could be artifactual. No evidence of right heart strain. Ventricular caliber is similar to 09/20/2019. Cardiomegaly. No pericardial effusion No aortic aneurysm or dissection. There is a 6 mm oval nodule along the medial wall of the aortic arch (series 5/image 66). Differential considerations include a mural thrombus small intimal flap. There is moderate narrowing of the proximal left subclavian artery (series 5/image 36) secondary to low-density atherosclerotic plaque or mural thrombus. This has increased from CT 09/20/2019. Mediastinum/Nodes: Debris within the right posterior trachea. Unremarkable esophagus. No thoracic adenopathy. Lungs/Pleura: Lungs are clear. Mild diffuse bronchial wall thickening. No pleural effusion or pneumothorax. Upper Abdomen: No acute abnormality. Vicarious excretion of contrast in the bladder. Musculoskeletal: No acute fracture. Review of the MIP images confirms the above findings. IMPRESSION: 1. Nonocclusive filling defects in two segmental right upper lobe pulmonary arteries suspicious for acute pulmonary embolism. There is some motion in this area and this could be artifactual. Consider lower extremity venous ultrasound to look for possible source. 2. There is a 6 mm oval nodule along the medial wall of the aortic arch. Differential considerations include a mural thrombus or small intimal flap. 3. There is moderate narrowing of the proximal left subclavian artery secondary to low-density atherosclerotic plaque or mural  thrombus. This has increased from CT 09/20/2019. 4. Debris within the trachea. Critical Value/emergent results were called by telephone at the time of interpretation on 04/26/2023 at 11:48 pm to provider Dr. Amada Jupiter, who verbally acknowledged these results. Electronically Signed   By: Minerva Fester M.D.   On: 04/27/2023 00:00   ECHOCARDIOGRAM COMPLETE  Result Date: 04/26/2023    ECHOCARDIOGRAM REPORT   Patient Name:   MALONE LAVERY [facility] Date of Exam: 04/26/2023 Medical Rec #:  [id number]         Height:       69.5 in Accession #:    [id number]        Weight:       180.3 lb Date of Birth:  1945-12-27        BSA:          1.987 m Patient Age:    32 years          BP:           128/81 mmHg Patient Gender: M                 HR:           69 bpm. Exam Location:  Inpatient Procedure: 2D Echo, Cardiac Doppler and Color Doppler Indications:    Stroke I63.9  History:        Patient has prior history of Echocardiogram examinations, most                 recent 10/14/2022. Cancer, Signs/Symptoms:Altered Mental Status;                 Risk Factors:Former Smoker.  Sonographer:    Aron Baba Referring Phys: [phone] MCNEILL P KIRKPATRICK  Sonographer Comments: Suboptimal subcostal window. Image acquisition challenging due to respiratory motion. IMPRESSIONS  1. Left ventricular ejection fraction, by estimation, is 60 to 65%. The left ventricle has normal function. The left ventricle has no regional wall motion abnormalities. There is mild left ventricular hypertrophy. Left ventricular diastolic parameters are consistent with Grade I diastolic dysfunction (impaired relaxation).  2. Right ventricular systolic function is normal. The right ventricular size is normal.  64-66) suspicious for acute pulmonary embolism. However there is some motion in this area and this could be artifactual. No evidence of right heart strain. Ventricular caliber is similar to 09/20/2019. Cardiomegaly. No pericardial effusion No aortic aneurysm or dissection. There is a 6 mm oval nodule along the medial wall of the aortic arch (series 5/image 66). Differential considerations include a mural thrombus small intimal flap. There is moderate narrowing of the proximal left subclavian artery (series 5/image 36) secondary to low-density atherosclerotic plaque or mural thrombus. This has increased from CT 09/20/2019. Mediastinum/Nodes: Debris within the right posterior trachea. Unremarkable esophagus. No thoracic adenopathy. Lungs/Pleura: Lungs are clear. Mild diffuse bronchial wall thickening. No pleural effusion or pneumothorax. Upper Abdomen: No acute abnormality. Vicarious excretion of contrast in the bladder. Musculoskeletal: No acute fracture. Review of the MIP images confirms the above findings. IMPRESSION: 1. Nonocclusive filling defects in two segmental right upper lobe pulmonary arteries suspicious for acute pulmonary embolism. There is some motion in this area and this could be artifactual. Consider lower extremity venous ultrasound to look for possible source. 2. There is a 6 mm oval nodule along the medial wall of the aortic arch. Differential considerations include a mural thrombus or small intimal flap. 3. There is moderate narrowing of the proximal left subclavian artery secondary to low-density atherosclerotic plaque or mural  thrombus. This has increased from CT 09/20/2019. 4. Debris within the trachea. Critical Value/emergent results were called by telephone at the time of interpretation on 04/26/2023 at 11:48 pm to provider Dr. Amada Jupiter, who verbally acknowledged these results. Electronically Signed   By: Minerva Fester M.D.   On: 04/27/2023 00:00   ECHOCARDIOGRAM COMPLETE  Result Date: 04/26/2023    ECHOCARDIOGRAM REPORT   Patient Name:   MALONE LAVERY [facility] Date of Exam: 04/26/2023 Medical Rec #:  [id number]         Height:       69.5 in Accession #:    [id number]        Weight:       180.3 lb Date of Birth:  1945-12-27        BSA:          1.987 m Patient Age:    32 years          BP:           128/81 mmHg Patient Gender: M                 HR:           69 bpm. Exam Location:  Inpatient Procedure: 2D Echo, Cardiac Doppler and Color Doppler Indications:    Stroke I63.9  History:        Patient has prior history of Echocardiogram examinations, most                 recent 10/14/2022. Cancer, Signs/Symptoms:Altered Mental Status;                 Risk Factors:Former Smoker.  Sonographer:    Aron Baba Referring Phys: [phone] MCNEILL P KIRKPATRICK  Sonographer Comments: Suboptimal subcostal window. Image acquisition challenging due to respiratory motion. IMPRESSIONS  1. Left ventricular ejection fraction, by estimation, is 60 to 65%. The left ventricle has normal function. The left ventricle has no regional wall motion abnormalities. There is mild left ventricular hypertrophy. Left ventricular diastolic parameters are consistent with Grade I diastolic dysfunction (impaired relaxation).  2. Right ventricular systolic function is normal. The right ventricular size is normal.  MR Peak grad: 2.7 mmHg MR Vmax:      82.50 cm/s    SHUNTS MV E velocity: 62.70 cm/s   Systemic VTI:  0.21 m MV A velocity: 106.00 cm/s  Systemic Diam: 2.10 cm MV E/A ratio:  0.59 Carolan Clines Electronically signed by Carolan Clines Signature Date/Time: 04/26/2023/3:11:35 PM    Final    DG Swallowing Func-Speech Pathology  Result Date: 04/26/2023 Table formatting from the original result was not included. Modified Barium Swallow Study Patient Details Name: HAROLD BEEKS MRN: [id number] Date of Birth: 1946-06-12 Today's Date: 04/26/2023 HPI/PMH: HPI: Pt is a 77 y.o. male who presneted on 04/25/23 with right sided weakness and difficulty speaking. CT head revealed acute intraparenchymal hematoma at the left thalamic capsular region.   Significant PMH includes diverticulitius, GERD, A-fib. Clinical Impression: Clinical Impression: Pt seen by SLP for modified barium swallow study following bedside swallow evaluation post stroke. Pt presents with a moderate oropharyngeal dysphagia primarily chracterised by mistiming and coordination of epiglottic inversion and laryngeal vestibule closure resulting in silent aspiration of thin liquids. Dysfunction of coordination can likely be attributed to impairment of motor planning and  sensation of swallowing mechanisms as a result of stroke. Gross silent aspiration and penetration occured during thin and nectar consistency adminstration. Mistiming of epiglottic inversion resulted in more than 50% bolus entering airway. Aspiration did not ellicit cough response. When cued to cough, aspirate did eject from airway. No aspiration observed with honey thick, puree, or solid consistencies. Belching and hiccuping observed at end of study, which can be attributted to pt hx of GERD. Chin tuck manuever effectively prevented bolus from entering airway with nectar thick, but penetration did occur. Pt did not effectievly utilize chin tuck, as he would untuck his chin when swallow was initiated. Chin tuck will liekly be an effective compensatory strategy given cognitive status improves. SLP reccommendation of dys 2 (minced) and honey thick liquid. ST will continue to f/u to ensure diet toleration, use of compensatory strategies, and upgraded PO trials. Factors that may increase risk of adverse event in presence of aspiration Rubye Oaks & Clearance Coots 2021): Factors that may increase risk of adverse event in presence of aspiration Rubye Oaks & Clearance Coots 2021): Reduced cognitive function Recommendations/Plan: Swallowing Evaluation Recommendations Swallowing Evaluation Recommendations Recommendations: PO diet PO Diet Recommendation: Dysphagia 2 (Finely chopped); Moderately thick liquids (Level 3, honey thick) Liquid Administration via: Spoon; Cup Medication Administration: Whole meds with puree Supervision: Full supervision/cueing for swallowing strategies; Full assist for feeding Swallowing strategies  : Slow rate; Small bites/sips; Chin tuck Postural changes: Position pt fully upright for meals Oral care recommendations: Oral care BID (2x/day); Staff/trained caregiver to provide oral care Treatment Plan Treatment Plan Treatment recommendations: Therapy as outlined in treatment plan below Follow-up recommendations: Acute  inpatient rehab (3 hours/day) Functional status assessment: Patient has had a recent decline in their functional status and demonstrates the ability to make significant improvements in function in a reasonable and predictable amount of time. Treatment frequency: Min 3x/week Treatment duration: 2 weeks Interventions: Compensatory techniques; Patient/family education; Trials of upgraded texture/liquids; Diet toleration management by SLP Recommendations Recommendations for follow up therapy are one component of a multi-disciplinary discharge planning process, led by the attending physician.  Recommendations may be updated based on patient status, additional functional criteria and insurance authorization. Assessment: Orofacial Exam: Orofacial Exam Oral Cavity: Oral Hygiene: WFL Oral Cavity - Dentition: Adequate natural dentition Orofacial Anatomy: WFL Oral Motor/Sensory Function: Suspected cranial nerve impairment CN V - Trigeminal: Right sensory impairment;  MR Peak grad: 2.7 mmHg MR Vmax:      82.50 cm/s    SHUNTS MV E velocity: 62.70 cm/s   Systemic VTI:  0.21 m MV A velocity: 106.00 cm/s  Systemic Diam: 2.10 cm MV E/A ratio:  0.59 Carolan Clines Electronically signed by Carolan Clines Signature Date/Time: 04/26/2023/3:11:35 PM    Final    DG Swallowing Func-Speech Pathology  Result Date: 04/26/2023 Table formatting from the original result was not included. Modified Barium Swallow Study Patient Details Name: HAROLD BEEKS MRN: [id number] Date of Birth: 1946-06-12 Today's Date: 04/26/2023 HPI/PMH: HPI: Pt is a 77 y.o. male who presneted on 04/25/23 with right sided weakness and difficulty speaking. CT head revealed acute intraparenchymal hematoma at the left thalamic capsular region.   Significant PMH includes diverticulitius, GERD, A-fib. Clinical Impression: Clinical Impression: Pt seen by SLP for modified barium swallow study following bedside swallow evaluation post stroke. Pt presents with a moderate oropharyngeal dysphagia primarily chracterised by mistiming and coordination of epiglottic inversion and laryngeal vestibule closure resulting in silent aspiration of thin liquids. Dysfunction of coordination can likely be attributed to impairment of motor planning and  sensation of swallowing mechanisms as a result of stroke. Gross silent aspiration and penetration occured during thin and nectar consistency adminstration. Mistiming of epiglottic inversion resulted in more than 50% bolus entering airway. Aspiration did not ellicit cough response. When cued to cough, aspirate did eject from airway. No aspiration observed with honey thick, puree, or solid consistencies. Belching and hiccuping observed at end of study, which can be attributted to pt hx of GERD. Chin tuck manuever effectively prevented bolus from entering airway with nectar thick, but penetration did occur. Pt did not effectievly utilize chin tuck, as he would untuck his chin when swallow was initiated. Chin tuck will liekly be an effective compensatory strategy given cognitive status improves. SLP reccommendation of dys 2 (minced) and honey thick liquid. ST will continue to f/u to ensure diet toleration, use of compensatory strategies, and upgraded PO trials. Factors that may increase risk of adverse event in presence of aspiration Rubye Oaks & Clearance Coots 2021): Factors that may increase risk of adverse event in presence of aspiration Rubye Oaks & Clearance Coots 2021): Reduced cognitive function Recommendations/Plan: Swallowing Evaluation Recommendations Swallowing Evaluation Recommendations Recommendations: PO diet PO Diet Recommendation: Dysphagia 2 (Finely chopped); Moderately thick liquids (Level 3, honey thick) Liquid Administration via: Spoon; Cup Medication Administration: Whole meds with puree Supervision: Full supervision/cueing for swallowing strategies; Full assist for feeding Swallowing strategies  : Slow rate; Small bites/sips; Chin tuck Postural changes: Position pt fully upright for meals Oral care recommendations: Oral care BID (2x/day); Staff/trained caregiver to provide oral care Treatment Plan Treatment Plan Treatment recommendations: Therapy as outlined in treatment plan below Follow-up recommendations: Acute  inpatient rehab (3 hours/day) Functional status assessment: Patient has had a recent decline in their functional status and demonstrates the ability to make significant improvements in function in a reasonable and predictable amount of time. Treatment frequency: Min 3x/week Treatment duration: 2 weeks Interventions: Compensatory techniques; Patient/family education; Trials of upgraded texture/liquids; Diet toleration management by SLP Recommendations Recommendations for follow up therapy are one component of a multi-disciplinary discharge planning process, led by the attending physician.  Recommendations may be updated based on patient status, additional functional criteria and insurance authorization. Assessment: Orofacial Exam: Orofacial Exam Oral Cavity: Oral Hygiene: WFL Oral Cavity - Dentition: Adequate natural dentition Orofacial Anatomy: WFL Oral Motor/Sensory Function: Suspected cranial nerve impairment CN V - Trigeminal: Right sensory impairment;  64-66) suspicious for acute pulmonary embolism. However there is some motion in this area and this could be artifactual. No evidence of right heart strain. Ventricular caliber is similar to 09/20/2019. Cardiomegaly. No pericardial effusion No aortic aneurysm or dissection. There is a 6 mm oval nodule along the medial wall of the aortic arch (series 5/image 66). Differential considerations include a mural thrombus small intimal flap. There is moderate narrowing of the proximal left subclavian artery (series 5/image 36) secondary to low-density atherosclerotic plaque or mural thrombus. This has increased from CT 09/20/2019. Mediastinum/Nodes: Debris within the right posterior trachea. Unremarkable esophagus. No thoracic adenopathy. Lungs/Pleura: Lungs are clear. Mild diffuse bronchial wall thickening. No pleural effusion or pneumothorax. Upper Abdomen: No acute abnormality. Vicarious excretion of contrast in the bladder. Musculoskeletal: No acute fracture. Review of the MIP images confirms the above findings. IMPRESSION: 1. Nonocclusive filling defects in two segmental right upper lobe pulmonary arteries suspicious for acute pulmonary embolism. There is some motion in this area and this could be artifactual. Consider lower extremity venous ultrasound to look for possible source. 2. There is a 6 mm oval nodule along the medial wall of the aortic arch. Differential considerations include a mural thrombus or small intimal flap. 3. There is moderate narrowing of the proximal left subclavian artery secondary to low-density atherosclerotic plaque or mural  thrombus. This has increased from CT 09/20/2019. 4. Debris within the trachea. Critical Value/emergent results were called by telephone at the time of interpretation on 04/26/2023 at 11:48 pm to provider Dr. Amada Jupiter, who verbally acknowledged these results. Electronically Signed   By: Minerva Fester M.D.   On: 04/27/2023 00:00   ECHOCARDIOGRAM COMPLETE  Result Date: 04/26/2023    ECHOCARDIOGRAM REPORT   Patient Name:   MALONE LAVERY [facility] Date of Exam: 04/26/2023 Medical Rec #:  [id number]         Height:       69.5 in Accession #:    [id number]        Weight:       180.3 lb Date of Birth:  1945-12-27        BSA:          1.987 m Patient Age:    32 years          BP:           128/81 mmHg Patient Gender: M                 HR:           69 bpm. Exam Location:  Inpatient Procedure: 2D Echo, Cardiac Doppler and Color Doppler Indications:    Stroke I63.9  History:        Patient has prior history of Echocardiogram examinations, most                 recent 10/14/2022. Cancer, Signs/Symptoms:Altered Mental Status;                 Risk Factors:Former Smoker.  Sonographer:    Aron Baba Referring Phys: [phone] MCNEILL P KIRKPATRICK  Sonographer Comments: Suboptimal subcostal window. Image acquisition challenging due to respiratory motion. IMPRESSIONS  1. Left ventricular ejection fraction, by estimation, is 60 to 65%. The left ventricle has normal function. The left ventricle has no regional wall motion abnormalities. There is mild left ventricular hypertrophy. Left ventricular diastolic parameters are consistent with Grade I diastolic dysfunction (impaired relaxation).  2. Right ventricular systolic function is normal. The right ventricular size is normal.  MR Peak grad: 2.7 mmHg MR Vmax:      82.50 cm/s    SHUNTS MV E velocity: 62.70 cm/s   Systemic VTI:  0.21 m MV A velocity: 106.00 cm/s  Systemic Diam: 2.10 cm MV E/A ratio:  0.59 Carolan Clines Electronically signed by Carolan Clines Signature Date/Time: 04/26/2023/3:11:35 PM    Final    DG Swallowing Func-Speech Pathology  Result Date: 04/26/2023 Table formatting from the original result was not included. Modified Barium Swallow Study Patient Details Name: HAROLD BEEKS MRN: [id number] Date of Birth: 1946-06-12 Today's Date: 04/26/2023 HPI/PMH: HPI: Pt is a 77 y.o. male who presneted on 04/25/23 with right sided weakness and difficulty speaking. CT head revealed acute intraparenchymal hematoma at the left thalamic capsular region.   Significant PMH includes diverticulitius, GERD, A-fib. Clinical Impression: Clinical Impression: Pt seen by SLP for modified barium swallow study following bedside swallow evaluation post stroke. Pt presents with a moderate oropharyngeal dysphagia primarily chracterised by mistiming and coordination of epiglottic inversion and laryngeal vestibule closure resulting in silent aspiration of thin liquids. Dysfunction of coordination can likely be attributed to impairment of motor planning and  sensation of swallowing mechanisms as a result of stroke. Gross silent aspiration and penetration occured during thin and nectar consistency adminstration. Mistiming of epiglottic inversion resulted in more than 50% bolus entering airway. Aspiration did not ellicit cough response. When cued to cough, aspirate did eject from airway. No aspiration observed with honey thick, puree, or solid consistencies. Belching and hiccuping observed at end of study, which can be attributted to pt hx of GERD. Chin tuck manuever effectively prevented bolus from entering airway with nectar thick, but penetration did occur. Pt did not effectievly utilize chin tuck, as he would untuck his chin when swallow was initiated. Chin tuck will liekly be an effective compensatory strategy given cognitive status improves. SLP reccommendation of dys 2 (minced) and honey thick liquid. ST will continue to f/u to ensure diet toleration, use of compensatory strategies, and upgraded PO trials. Factors that may increase risk of adverse event in presence of aspiration Rubye Oaks & Clearance Coots 2021): Factors that may increase risk of adverse event in presence of aspiration Rubye Oaks & Clearance Coots 2021): Reduced cognitive function Recommendations/Plan: Swallowing Evaluation Recommendations Swallowing Evaluation Recommendations Recommendations: PO diet PO Diet Recommendation: Dysphagia 2 (Finely chopped); Moderately thick liquids (Level 3, honey thick) Liquid Administration via: Spoon; Cup Medication Administration: Whole meds with puree Supervision: Full supervision/cueing for swallowing strategies; Full assist for feeding Swallowing strategies  : Slow rate; Small bites/sips; Chin tuck Postural changes: Position pt fully upright for meals Oral care recommendations: Oral care BID (2x/day); Staff/trained caregiver to provide oral care Treatment Plan Treatment Plan Treatment recommendations: Therapy as outlined in treatment plan below Follow-up recommendations: Acute  inpatient rehab (3 hours/day) Functional status assessment: Patient has had a recent decline in their functional status and demonstrates the ability to make significant improvements in function in a reasonable and predictable amount of time. Treatment frequency: Min 3x/week Treatment duration: 2 weeks Interventions: Compensatory techniques; Patient/family education; Trials of upgraded texture/liquids; Diet toleration management by SLP Recommendations Recommendations for follow up therapy are one component of a multi-disciplinary discharge planning process, led by the attending physician.  Recommendations may be updated based on patient status, additional functional criteria and insurance authorization. Assessment: Orofacial Exam: Orofacial Exam Oral Cavity: Oral Hygiene: WFL Oral Cavity - Dentition: Adequate natural dentition Orofacial Anatomy: WFL Oral Motor/Sensory Function: Suspected cranial nerve impairment CN V - Trigeminal: Right sensory impairment;  MR Peak grad: 2.7 mmHg MR Vmax:      82.50 cm/s    SHUNTS MV E velocity: 62.70 cm/s   Systemic VTI:  0.21 m MV A velocity: 106.00 cm/s  Systemic Diam: 2.10 cm MV E/A ratio:  0.59 Carolan Clines Electronically signed by Carolan Clines Signature Date/Time: 04/26/2023/3:11:35 PM    Final    DG Swallowing Func-Speech Pathology  Result Date: 04/26/2023 Table formatting from the original result was not included. Modified Barium Swallow Study Patient Details Name: HAROLD BEEKS MRN: [id number] Date of Birth: 1946-06-12 Today's Date: 04/26/2023 HPI/PMH: HPI: Pt is a 77 y.o. male who presneted on 04/25/23 with right sided weakness and difficulty speaking. CT head revealed acute intraparenchymal hematoma at the left thalamic capsular region.   Significant PMH includes diverticulitius, GERD, A-fib. Clinical Impression: Clinical Impression: Pt seen by SLP for modified barium swallow study following bedside swallow evaluation post stroke. Pt presents with a moderate oropharyngeal dysphagia primarily chracterised by mistiming and coordination of epiglottic inversion and laryngeal vestibule closure resulting in silent aspiration of thin liquids. Dysfunction of coordination can likely be attributed to impairment of motor planning and  sensation of swallowing mechanisms as a result of stroke. Gross silent aspiration and penetration occured during thin and nectar consistency adminstration. Mistiming of epiglottic inversion resulted in more than 50% bolus entering airway. Aspiration did not ellicit cough response. When cued to cough, aspirate did eject from airway. No aspiration observed with honey thick, puree, or solid consistencies. Belching and hiccuping observed at end of study, which can be attributted to pt hx of GERD. Chin tuck manuever effectively prevented bolus from entering airway with nectar thick, but penetration did occur. Pt did not effectievly utilize chin tuck, as he would untuck his chin when swallow was initiated. Chin tuck will liekly be an effective compensatory strategy given cognitive status improves. SLP reccommendation of dys 2 (minced) and honey thick liquid. ST will continue to f/u to ensure diet toleration, use of compensatory strategies, and upgraded PO trials. Factors that may increase risk of adverse event in presence of aspiration Rubye Oaks & Clearance Coots 2021): Factors that may increase risk of adverse event in presence of aspiration Rubye Oaks & Clearance Coots 2021): Reduced cognitive function Recommendations/Plan: Swallowing Evaluation Recommendations Swallowing Evaluation Recommendations Recommendations: PO diet PO Diet Recommendation: Dysphagia 2 (Finely chopped); Moderately thick liquids (Level 3, honey thick) Liquid Administration via: Spoon; Cup Medication Administration: Whole meds with puree Supervision: Full supervision/cueing for swallowing strategies; Full assist for feeding Swallowing strategies  : Slow rate; Small bites/sips; Chin tuck Postural changes: Position pt fully upright for meals Oral care recommendations: Oral care BID (2x/day); Staff/trained caregiver to provide oral care Treatment Plan Treatment Plan Treatment recommendations: Therapy as outlined in treatment plan below Follow-up recommendations: Acute  inpatient rehab (3 hours/day) Functional status assessment: Patient has had a recent decline in their functional status and demonstrates the ability to make significant improvements in function in a reasonable and predictable amount of time. Treatment frequency: Min 3x/week Treatment duration: 2 weeks Interventions: Compensatory techniques; Patient/family education; Trials of upgraded texture/liquids; Diet toleration management by SLP Recommendations Recommendations for follow up therapy are one component of a multi-disciplinary discharge planning process, led by the attending physician.  Recommendations may be updated based on patient status, additional functional criteria and insurance authorization. Assessment: Orofacial Exam: Orofacial Exam Oral Cavity: Oral Hygiene: WFL Oral Cavity - Dentition: Adequate natural dentition Orofacial Anatomy: WFL Oral Motor/Sensory Function: Suspected cranial nerve impairment CN V - Trigeminal: Right sensory impairment;  MR Peak grad: 2.7 mmHg MR Vmax:      82.50 cm/s    SHUNTS MV E velocity: 62.70 cm/s   Systemic VTI:  0.21 m MV A velocity: 106.00 cm/s  Systemic Diam: 2.10 cm MV E/A ratio:  0.59 Carolan Clines Electronically signed by Carolan Clines Signature Date/Time: 04/26/2023/3:11:35 PM    Final    DG Swallowing Func-Speech Pathology  Result Date: 04/26/2023 Table formatting from the original result was not included. Modified Barium Swallow Study Patient Details Name: HAROLD BEEKS MRN: [id number] Date of Birth: 1946-06-12 Today's Date: 04/26/2023 HPI/PMH: HPI: Pt is a 77 y.o. male who presneted on 04/25/23 with right sided weakness and difficulty speaking. CT head revealed acute intraparenchymal hematoma at the left thalamic capsular region.   Significant PMH includes diverticulitius, GERD, A-fib. Clinical Impression: Clinical Impression: Pt seen by SLP for modified barium swallow study following bedside swallow evaluation post stroke. Pt presents with a moderate oropharyngeal dysphagia primarily chracterised by mistiming and coordination of epiglottic inversion and laryngeal vestibule closure resulting in silent aspiration of thin liquids. Dysfunction of coordination can likely be attributed to impairment of motor planning and  sensation of swallowing mechanisms as a result of stroke. Gross silent aspiration and penetration occured during thin and nectar consistency adminstration. Mistiming of epiglottic inversion resulted in more than 50% bolus entering airway. Aspiration did not ellicit cough response. When cued to cough, aspirate did eject from airway. No aspiration observed with honey thick, puree, or solid consistencies. Belching and hiccuping observed at end of study, which can be attributted to pt hx of GERD. Chin tuck manuever effectively prevented bolus from entering airway with nectar thick, but penetration did occur. Pt did not effectievly utilize chin tuck, as he would untuck his chin when swallow was initiated. Chin tuck will liekly be an effective compensatory strategy given cognitive status improves. SLP reccommendation of dys 2 (minced) and honey thick liquid. ST will continue to f/u to ensure diet toleration, use of compensatory strategies, and upgraded PO trials. Factors that may increase risk of adverse event in presence of aspiration Rubye Oaks & Clearance Coots 2021): Factors that may increase risk of adverse event in presence of aspiration Rubye Oaks & Clearance Coots 2021): Reduced cognitive function Recommendations/Plan: Swallowing Evaluation Recommendations Swallowing Evaluation Recommendations Recommendations: PO diet PO Diet Recommendation: Dysphagia 2 (Finely chopped); Moderately thick liquids (Level 3, honey thick) Liquid Administration via: Spoon; Cup Medication Administration: Whole meds with puree Supervision: Full supervision/cueing for swallowing strategies; Full assist for feeding Swallowing strategies  : Slow rate; Small bites/sips; Chin tuck Postural changes: Position pt fully upright for meals Oral care recommendations: Oral care BID (2x/day); Staff/trained caregiver to provide oral care Treatment Plan Treatment Plan Treatment recommendations: Therapy as outlined in treatment plan below Follow-up recommendations: Acute  inpatient rehab (3 hours/day) Functional status assessment: Patient has had a recent decline in their functional status and demonstrates the ability to make significant improvements in function in a reasonable and predictable amount of time. Treatment frequency: Min 3x/week Treatment duration: 2 weeks Interventions: Compensatory techniques; Patient/family education; Trials of upgraded texture/liquids; Diet toleration management by SLP Recommendations Recommendations for follow up therapy are one component of a multi-disciplinary discharge planning process, led by the attending physician.  Recommendations may be updated based on patient status, additional functional criteria and insurance authorization. Assessment: Orofacial Exam: Orofacial Exam Oral Cavity: Oral Hygiene: WFL Oral Cavity - Dentition: Adequate natural dentition Orofacial Anatomy: WFL Oral Motor/Sensory Function: Suspected cranial nerve impairment CN V - Trigeminal: Right sensory impairment;

## 2023-05-05 NOTE — TOC Transition Note (Signed)
Transition of Care [facility]) - CM/SW Discharge Note   Patient Details  Name: Mason [name] MRN: [id number] Date of Birth: 1946-05-21  [facility] CM/SW Contact:  Baldemar Lenis, [place] Phone Number: 05/05/2023, 1:49 PM   Clinical Narrative:   CSW spoke with DON at [facility] to explain situation, she said she would look at referral again. DON called back that they are able to accept the patient. CSW spoke with son, Mason [name], to provide update, and he is in agreement. CSW sent discharge summary to [facility], attempted to make sure that it was received and they were ready to admit patient. CSW left multiple messages, no call back. Nurse called report and updated CSW that patient can admit. Transport arranged with [facility] for next available.    Final next level of care: Skilled Nursing Facility Barriers to Discharge: Barriers Resolved   Patient Goals and CMS Choice CMS Medicare.gov Compare Post Acute Care list provided to:: Patient Represenative (must comment) Choice offered to / list presented to : Adult Children  Discharge Placement                Patient chooses bed at: [facility] Patient to be transferred to facility by: [facility] Name of family member notified: Mason [name] Patient and family notified of of transfer: 05/05/23  Discharge Plan and Services Additional resources added to the After Visit Summary for   In-house Referral: Clinical Social Work   Post Acute Care Choice: NA                               Social Determinants of Health (SDOH) Interventions SDOH Screenings   Food Insecurity: No Food Insecurity (05/02/2023)  Housing: Low Risk  (05/02/2023)  Transportation Needs: No Transportation Needs (05/02/2023)  Utilities: Not At Risk (05/02/2023)  Alcohol Screen: Low Risk  (07/08/2021)  Depression (PHQ2-9): Low Risk  (04/14/2023)  Financial Resource Strain: Low Risk  (07/20/2022)  Physical Activity: Insufficiently Active (07/20/2022)  Social  Connections: Socially Isolated (07/20/2022)  Stress: No Stress Concern Present (07/20/2022)  Tobacco Use: Medium Risk (05/02/2023)     Readmission Risk Interventions     No data to display

## 2023-05-05 NOTE — Progress Notes (Signed)
Discharged to [facility] by [employer].  Son  Xin at bedside.  Flu vaccine given prior to dicharge.

## 2023-05-05 NOTE — Progress Notes (Signed)
PT Cancellation Note  Patient Details Name: Mason [name] MRN: [id number] DOB: 12/31/1945   Cancelled Treatment:    Reason Eval/Treat Not Completed: Other (comment)  Awaiting [facility] transport to SNF.  Berton Mount 05/05/2023, 2:39 PM

## 2023-05-07 LAB — CULTURE, BLOOD (ROUTINE X 2)
Culture: NO GROWTH
Culture: NO GROWTH
Special Requests: ADEQUATE
Special Requests: ADEQUATE

## 2023-05-18 ENCOUNTER — Ambulatory Visit: Payer: Medicare Other | Admitting: Dermatology

## 2023-05-30 ENCOUNTER — Telehealth: Payer: Self-pay

## 2023-05-30 DIAGNOSIS — H409 Unspecified glaucoma: Secondary | ICD-10-CM | POA: Insufficient documentation

## 2023-05-30 DIAGNOSIS — Z87891 Personal history of nicotine dependence: Secondary | ICD-10-CM | POA: Insufficient documentation

## 2023-05-30 DIAGNOSIS — I69359 Hemiplegia and hemiparesis following cerebral infarction affecting unspecified side: Secondary | ICD-10-CM | POA: Insufficient documentation

## 2023-05-30 DIAGNOSIS — R262 Difficulty in walking, not elsewhere classified: Secondary | ICD-10-CM | POA: Insufficient documentation

## 2023-05-30 NOTE — Progress Notes (Signed)
Name: Mason [name] DOB: Aug 03, 1946 MRN: [id number]  History of Present Illness: Mason [name] is a 77 y.o. male who presents today as a new patient at [facility]. All available relevant medical records have been reviewed. He is accompanied by Madie Reno (GCA) from [facility] and his daughter Nigel Mormon. His son Encarnacion Degruy participated via speakerphone. - GU history: Previously seen at [facility].  1. BPH with BOO & LUTS (intermittent stream, hesitancy, urgency, nocturia). - Taking Flomax 0.4 mg daily. 2. Elevated PSA. - No known family history of prostate cancer. - No prior prostate biopsy. 3. Prior distant episode of prostatitis. 4. Erectile dysfunction. Not significantly bothersome (not sexually active).  PSA values: - 04/2011: 2.75 - 04/2013: 3.14 - 04/2014: 4.38 - 08/2014: 3.75 - 03/02/2023: 4.67  He presents today for urinary retention / follow up after hospital discharge.  Recent history:  > 10/09/2022: CT abdomen/pelvis w/ contrast showed "Enlarged prostate measuring 5.4 x 4.4 x 5.6 cm".  > 04/12/2023: Seen by Dr. Cardell Peach at [facility]. 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 - RUS on 05/02/2023 showed no GU stones, masses, or hydronephrosis. [facility] course complicated by urinary retention which was attributed to bladder outlet obstruction secondary to constipation versus neurogenic bladder.   > 05/08/2023: Normal renal function (GFR 94; creatinine 0.71).  > 05/09/2023: - Failed voiding trial at SNF. Foley catheter in place. - Per note by Baker Pierini, Geriatric NP: "Hospital course was complicated by bladder outlet obstruction 2/2  constipation.Marland KitchenMarland KitchenFoley catheter remained in place at discharge due to failed bladder trials" despite resolution of constipation with medical management (Miralax and Senna). UA showed no evidence of UTI. Advised to continue Flomax 0.4 mg daily. Per note patient has dementia (A&O x1) and was unable to provide history.  Today: He denies history of urinary retention prior to current episode. He states that prior to his stroke he was able to void "normally" with no problems. He reports the catheter is draining okay but had to be flushed this morning due to sediment. He denies gross hematuria or abdominal pain today.  Fall Screening: Do you usually have a device to assist in your mobility? Yes - patient states he is unable to stand/pivot and had significant difficulty with 2 person lift assist recently  Medications: Current Outpatient Medications  Medication Sig Dispense Refill   amLODipine (NORVASC) 10 MG tablet Take 1 tablet (10 mg total) by mouth daily.     b complex vitamins capsule Take 1 capsule by mouth daily.     bimatoprost (LUMIGAN) 0.01 % SOLN Place 1 drop into both eyes at bedtime.     Cholecalciferol (VITAMIN D) 125 MCG (5000 UT) CAPS Take 1 tablet by mouth daily.     esomeprazole (NEXIUM) 40 MG capsule Take 1 capsule by mouth twice daily 60 capsule 3   fluticasone (FLONASE) 50 MCG/ACT nasal spray Place 1 spray into both nostrils daily.     losartan (COZAAR) 25 MG tablet Take 1 tablet (25 mg total) by mouth daily.     melatonin 3 MG TABS tablet Take 1 tablet (3 mg total) by mouth at bedtime as needed.     polyethylene glycol (MIRALAX / GLYCOLAX) 17 g packet Take 17 g  by mouth 2 (two) times daily.     senna-docusate (SENOKOT-S) 8.6-50 MG tablet Take 1 tablet by mouth at bedtime as needed for mild constipation.     tamsulosin (FLOMAX) 0.4 MG CAPS capsule Take 1 capsule (0.4 mg total) by mouth daily after supper.     timolol (TIMOPTIC-XR) 0.5 % ophthalmic gel-forming Place 1 drop into both  eyes every morning.     vitamin C (ASCORBIC ACID) 500 MG tablet Take 500 mg by mouth daily.     zinc gluconate 50 MG tablet Take 50 mg by mouth daily.     No current facility-administered medications for this visit.    Allergies: Allergies  Allergen Reactions   Shellfish Allergy Shortness Of Breath, Rash and Other (See Comments)    GI upset Difficulty breathing  Reaction to shrimp and crab   Ak-Mycin [Erythromycin] Other (See Comments)    GI upset   Celexa [Citalopram] Other (See Comments)    Aggression    Levsin [Hyoscyamine] Other (See Comments)    Urinary retention   Librax [Chlordiazepoxide-Clidinium] Other (See Comments)    Urinary retention   Mobic [Meloxicam] Other (See Comments)    GI upset    Past Medical History:  Diagnosis Date   Actinic keratosis 04/02/2013   L sup scapula - bx proven   Actinic keratosis 04/02/2013   R med ant deltoid - bx proven   Allergy    Anal fissure    Anxiety    Basal cell carcinoma    back - treated in the past    Basal cell carcinoma 05/02/2009   left supratip, Mohs   Cancer (HCC)    basal cell removed x4   Cataract    bil eyes   Depression    Diverticulosis of colon    GERD (gastroesophageal reflux disease)    esophageal narrowing   Glaucoma    Hiatal hernia    IBS (irritable bowel syndrome)    Insomnia    Internal hemorrhoids    Past Surgical History:  Procedure Laterality Date   ANAL SPHINCTEROTOMY  03/1995   fistulotomy   APPENDECTOMY     BASAL CELL CARCINOMA EXCISION     on back   BICEPT TENODESIS Right 09/27/2004   decompression   COLONOSCOPY  04/1999   internal hemmroids,12-11-08 colon tics and hems only    ESOPHAGOGASTRODUODENOSCOPY  04/1999   with esophagitis and stricture   MOHS SURGERY  09/02/2008   L supratip of nose with flap,basal cell cancer [facility]/MOHS   MOUTH SURGERY  01/2020   TONGUE SURGERY  2017   had small place removed, noncancerous   TONSILLECTOMY AND ADENOIDECTOMY     remote   UPPER  GASTROINTESTINAL ENDOSCOPY     WRIST SURGERY Right    WRIST SURGERY     scar tissue removed   Family History  Problem Relation Age of Onset   Hypertension Mother    Stroke Mother    Pneumonia Father    Bladder Cancer Father    Cancer Maternal Aunt        ? type   Breast cancer Maternal Aunt        mets   Esophageal cancer Maternal Aunt        aunt ? esophageal ca   Stomach cancer Maternal Aunt        aunt ? stmach ca   Colon cancer Maternal Aunt        had colostomy - not sure of origin   Cancer  Maternal Grandmother        ? type   Prostate cancer Neg Hx    Rectal cancer Neg Hx    Social History   Socioeconomic History   Marital status: Divorced    Spouse name: Not on file   Number of children: 1   Years of education: Not on file   Highest education level: Not on file  Occupational History   Occupation: Art therapist, retired 1997    Employer: [employer]  Tobacco Use   Smoking status: Former    Current packs/day: 0.00    Types: Cigarettes, Cigars    Quit date: 08/22/1998    Years since quitting: 24.8   Smokeless tobacco: Never   Tobacco comments:    cigarettes 1968, cigars 2000  Vaping Use   Vaping status: Never Used  Substance and Sexual Activity   Alcohol use: Not Currently    Alcohol/week: 0.0 standard drinks of alcohol    Comment: rare   Drug use: No   Sexual activity: Never  Other Topics Concern   Not on file  Social History Narrative   Retired from [place]   From [place]   Prev divorced    1 son   Garret [place] '63-'67, no known agent orange exposure   Social Determinants of Corporate investment banker Strain: Low Risk  (07/20/2022)   Overall Financial Resource Strain (CARDIA)    Difficulty of Paying Living Expenses: Not hard at all  Food Insecurity: No Food Insecurity (05/02/2023)   Hunger Vital Sign    Worried About Running Out of Food in the Last Year: Never true    Ran Out of Food in the Last Year: Never true  Transportation Needs: No  Transportation Needs (05/02/2023)   PRAPARE - Administrator, Civil Service (Medical): No    Lack of Transportation (Non-Medical): No  Physical Activity: Insufficiently Active (07/20/2022)   Exercise Vital Sign    Days of Exercise per Week: 3 days    Minutes of Exercise per Session: 30 min  Stress: No Stress Concern Present (07/20/2022)   [employer] of Occupational Health - Occupational Stress Questionnaire    Feeling of Stress : Not at all  Social Connections: Socially Isolated (07/20/2022)   Social Connection and Isolation Panel [NHANES]    Frequency of Communication with Friends and Family: More than three times a week    Frequency of Social Gatherings with Friends and Family: Twice a week    Attends Religious Services: Never    Database administrator or Organizations: No    Attends Banker Meetings: Never    Marital Status: Divorced  Catering manager Violence: Not At Risk (05/02/2023)   Humiliation, Afraid, Rape, and Kick questionnaire    Fear of Current or Ex-Partner: No    Emotionally Abused: No    Physically Abused: No    Sexually Abused: No    SUBJECTIVE  Review of Systems lntegumentary: Patient denies any rashes or pruritus Gastrointestinal: Patient denies nausea, vomiting, constipation, or diarrhea Musculoskeletal: Patient reports right-sided weakness / loss of strength s/p stroke  GU: As per HPI.  OBJECTIVE Vitals:   06/05/23 0904  BP: 138/88  Pulse: 88  Temp: 98.7 F (37.1 C)   Body mass index is 24.07 kg/m.  Physical Examination Constitutional: No obvious distress; patient is non-toxic appearing  Cardiovascular: No visible lower extremity edema.  Respiratory: The patient does not have audible wheezing/stridor; respirations do not appear labored  Gastrointestinal: Abdomen non-distended  Musculoskeletal: Right hemiplegia Skin: No obvious rashes/open sores  Neurologic: CN 2-12 grossly intact Psychiatric: Answered questions  appropriately with normal affect; he is a good historian overall with occasional mild lapses in memory Hematologic/Lymphatic/Immunologic: No obvious bruises or sites of spontaneous bleeding  GU: Catheter draining clear yellow urine.   ASSESSMENT Urinary retention  Foley catheter in place  Hospital discharge follow-up  Benign prostatic hyperplasia, unspecified whether lower urinary tract symptoms present  History of elevated PSA  History of hemorrhagic stroke with residual hemiplegia (HCC)  Weakness of right side of body  Ambulatory dysfunction  Former smoker  We discussed pt's urinary retention and possible etiologies including temporary detrusor areflexia, neurogenic bladder, BPH, constipation, anticholinergic medication use.  - Doubt BPH as primary etiology given that he denies any significant lower urinary tract symptoms / voiding dysfunction prior to his stroke.  - Doubt constipation as primary etiology as that has been / continues to be well managed via medications (current medication list includes Senna PRN).   He was advised that his urinary retention is most likely secondary to his stroke due to either temporary detrusor areflexia or neurogenic bladder. His risk for neurogenic bladder may be increased due to smoking history.   We discussed option to consider voiding trial today, however he currently requires full lift assist with a Hoyer lift at his SNF. Since he is unable to stand & pivot and our office is not equipped with a Hoyer lift, performing an adequate voiding trial would be significantly challenging today. We also discussed concerns that he would likely struggle significantly / be unable to remove his clothing independently to use a handheld urinal. He was therefore advised to continue with indwelling Foley catheter at this time.   We discussed the possibility of regaining bladder function / contractility over the upcoming weeks/months. He was advised to continue his  efforts with PT/OT for optimal stroke recovery and to follow up with Urology in 1 month for reassessment; may attempt voiding trial if he is able to stand & pivot at that time.   Patient and family members verbalized understanding and agreement. All questions were answered.   PLAN Advised the following: Continue with indwelling Foley catheter for urinary retention.  Continue Flomax 0.4 mg daily for BPH. SNF staff are requested to exchange indwelling Foley catheter upon Mr. Wyvonnia Dusky return to [facility] today. SNF staff to exchange indwelling Foley catheter at least once every 4 weeks and more frequently if needed due to clogging. Return in about 4 weeks (around 07/03/2023) for f/u with Evette Georges, NP; possible voiding trial if patient able to stand & pivot.  No orders of the defined types were placed in this encounter.  Total time spent caring for the patient today was over 45 minutes. This includes time spent on the date of the visit reviewing the patient's chart before the visit, time spent during the visit, and time spent after the visit on documentation. Over 50% of that time was spent in face-to-face time with this patient for direct counseling. E&M based on time and complexity of medical decision making.  It has been explained that the patient is to follow regularly with their PCP in addition to all other providers involved in their care and to follow instructions provided by these respective offices. Patient advised to contact urology clinic if any urologic-pertaining questions, concerns, new symptoms or problems arise in the interim period.  There are no Patient Instructions on file for this visit.  Electronically signed by:  Donnita Falls, MSN, FNP-C, CUNP 06/05/2023 10:11 AM

## 2023-05-30 NOTE — Telephone Encounter (Signed)
-----   Message from Donnita Falls sent at 05/30/2023  9:31 AM EDT ----- New pt coming Monday 10/14. Patient has dementia & aphasia s/p stroke. Please contact family to notify them that patient's legal guardian / POA / decision maker must accompany him to this appointment. Thank you

## 2023-05-30 NOTE — Telephone Encounter (Signed)
Called patient's son and made him aware. Patient's son state that patient is at [facility] and the patient's son lives in [place].  Patient's son states he will get in contact with his sister Arnetha Massy to see if she can attend patient appointment. Tahj patient's son is aware that I will follow up with Kathrene Alu to ensure clinical staff attends appointment. [place] voiced understanding.   Patient's nurse [place], RN, states that patient will have clinical staff present at office visit.

## 2023-06-05 ENCOUNTER — Ambulatory Visit (INDEPENDENT_AMBULATORY_CARE_PROVIDER_SITE_OTHER): Payer: Medicare Other | Admitting: Urology

## 2023-06-05 ENCOUNTER — Encounter: Payer: Self-pay | Admitting: Urology

## 2023-06-05 VITALS — BP 138/88 | HR 88 | Temp 98.7°F | Ht 69.0 in | Wt 163.0 lb

## 2023-06-05 DIAGNOSIS — Z09 Encounter for follow-up examination after completed treatment for conditions other than malignant neoplasm: Secondary | ICD-10-CM

## 2023-06-05 DIAGNOSIS — N401 Enlarged prostate with lower urinary tract symptoms: Secondary | ICD-10-CM

## 2023-06-05 DIAGNOSIS — Z978 Presence of other specified devices: Secondary | ICD-10-CM

## 2023-06-05 DIAGNOSIS — N138 Other obstructive and reflux uropathy: Secondary | ICD-10-CM

## 2023-06-05 DIAGNOSIS — R339 Retention of urine, unspecified: Secondary | ICD-10-CM | POA: Diagnosis not present

## 2023-06-05 DIAGNOSIS — I69359 Hemiplegia and hemiparesis following cerebral infarction affecting unspecified side: Secondary | ICD-10-CM

## 2023-06-05 DIAGNOSIS — R531 Weakness: Secondary | ICD-10-CM

## 2023-06-05 DIAGNOSIS — Z87898 Personal history of other specified conditions: Secondary | ICD-10-CM

## 2023-06-05 DIAGNOSIS — R972 Elevated prostate specific antigen [PSA]: Secondary | ICD-10-CM

## 2023-06-05 DIAGNOSIS — N400 Enlarged prostate without lower urinary tract symptoms: Secondary | ICD-10-CM

## 2023-06-05 DIAGNOSIS — Z87891 Personal history of nicotine dependence: Secondary | ICD-10-CM

## 2023-06-05 DIAGNOSIS — R262 Difficulty in walking, not elsewhere classified: Secondary | ICD-10-CM

## 2023-06-29 NOTE — Progress Notes (Signed)
Name: Mason [name] DOB: Jul 28, 1946 MRN: [id number]  History of Present Illness: Mason [name] is a 77 y.o. male who presents today for follow up visit at [facility]. He is accompanied by an aid from [facility]. - GU history:  1. BPH with BOO & LUTS (intermittent stream, hesitancy, urgency, nocturia). - 10/09/2022: CT abdomen/pelvis w/ contrast showed "Enlarged prostate measuring 5.4 x 4.4 x 5.6 cm".  - Taking Flomax 0.4 mg daily. 2. Elevated PSA. - No known family history of prostate cancer. - No prior prostate biopsy. 3. Prior distant episode of prostatitis. 4. Erectile dysfunction. Not significantly bothersome (not sexually active).   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 - RUS on 05/02/2023 showed no GU stones, masses, or hydronephrosis. [facility] course complicated by urinary retention which was attributed to bladder outlet obstruction secondary to constipation versus neurogenic bladder.   > 05/09/2023: - Failed voiding trial at SNF. Foley catheter in place. - Per note by Baker Pierini, Geriatric NP: "Hospital course was complicated by bladder outlet obstruction 2/2 constipation.Marland KitchenMarland KitchenFoley catheter remained in place at discharge due to failed bladder trials" despite resolution of constipation with medical management (Miralax and Senna). UA showed no evidence of UTI. Advised to continue Flomax 0.4 mg daily.   At initial visit on 06/05/2023: - Denied prior history of urinary retention. He states that prior to his stroke he was able to void "normally" with no problems.  - Doubt BPH as primary etiology given that he denies any significant lower urinary tract symptoms / voiding dysfunction prior to his stroke.  - Doubt  constipation as primary etiology as that has been / continues to be well managed via Senna PRN.  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  - Voiding trial deferred due to patient's immobility; currently doing PT/OT to regain strength and functionality. - The plan was: 1. Continue with indwelling Foley catheter for urinary retention.  2. Continue Flomax 0.4 mg daily for BPH. 3. Return in about 4 weeks (around 07/03/2023) for f/u with Evette Georges, NP; possible voiding trial if patient able to stand & pivot.  Today: He reports the catheter is draining well.  It was last exchanged around 06/16/2023.  He denies gross hematuria.  He denies flank pain. He denies abdominal pain.   He has been working with Physical Therapy and reports improved ability to ambulate; he reports that he can now stand for 2-3 minutes with gait belt assist. He reports that outside of PT he remains in bed or a chair most of the time.   Fall Screening: Do you usually have a device to assist in your mobility? Yes   Medications: Current Outpatient Medications  Medication Sig Dispense Refill   amLODipine (NORVASC) 10 MG tablet Take 1 tablet (10 mg total) by mouth daily.     b complex vitamins capsule Take 1 capsule by mouth daily.     bimatoprost (LUMIGAN) 0.01 % SOLN Place 1 drop into both eyes at bedtime.     Cholecalciferol (VITAMIN D) 125 MCG (5000 UT) CAPS Take 1 tablet by mouth daily.     esomeprazole (NEXIUM) 40 MG capsule Take 1 capsule by mouth twice daily 60 capsule 3   fluticasone (FLONASE) 50 MCG/ACT nasal spray Place 1  spray into both nostrils daily.     losartan (COZAAR) 25 MG tablet Take 1 tablet (25 mg total) by mouth daily.     melatonin 3 MG TABS tablet Take 1 tablet (3 mg total) by mouth at bedtime as needed.     polyethylene glycol (MIRALAX / GLYCOLAX) 17 g packet Take 17 g by mouth 2 (two) times daily.     senna-docusate (SENOKOT-S) 8.6-50 MG tablet Take 1  tablet by mouth at bedtime as needed for mild constipation.     tamsulosin (FLOMAX) 0.4 MG CAPS capsule Take 1 capsule (0.4 mg total) by mouth daily after supper.     timolol (TIMOPTIC-XR) 0.5 % ophthalmic gel-forming Place 1 drop into both eyes every morning.     vitamin C (ASCORBIC ACID) 500 MG tablet Take 500 mg by mouth daily.     zinc gluconate 50 MG tablet Take 50 mg by mouth daily.     No current facility-administered medications for this visit.    Allergies: Allergies  Allergen Reactions   Shellfish Allergy Shortness Of Breath, Rash and Other (See Comments)    GI upset Difficulty breathing  Reaction to shrimp and crab   Ak-Mycin [Erythromycin] Other (See Comments)    GI upset   Celexa [Citalopram] Other (See Comments)    Aggression    Levsin [Hyoscyamine] Other (See Comments)    Urinary retention   Librax [Chlordiazepoxide-Clidinium] Other (See Comments)    Urinary retention   Mobic [Meloxicam] Other (See Comments)    GI upset    Past Medical History:  Diagnosis Date   Actinic keratosis 04/02/2013   L sup scapula - bx proven   Actinic keratosis 04/02/2013   R med ant deltoid - bx proven   Allergy    Anal fissure    Anxiety    Basal cell carcinoma    back - treated in the past    Basal cell carcinoma 05/02/2009   left supratip, Mohs   Cancer (HCC)    basal cell removed x4   Cataract    bil eyes   Depression    Diverticulosis of colon    GERD (gastroesophageal reflux disease)    esophageal narrowing   Glaucoma    Hiatal hernia    IBS (irritable bowel syndrome)    Insomnia    Internal hemorrhoids    Past Surgical History:  Procedure Laterality Date   ANAL SPHINCTEROTOMY  03/1995   fistulotomy   APPENDECTOMY     BASAL CELL CARCINOMA EXCISION     on back   BICEPT TENODESIS Right 09/27/2004   decompression   COLONOSCOPY  04/1999   internal hemmroids,12-11-08 colon tics and hems only    ESOPHAGOGASTRODUODENOSCOPY  04/1999   with esophagitis and  stricture   MOHS SURGERY  09/02/2008   L supratip of nose with flap,basal cell cancer [facility]/MOHS   MOUTH SURGERY  01/2020   TONGUE SURGERY  2017   had small place removed, noncancerous   TONSILLECTOMY AND ADENOIDECTOMY     remote   UPPER GASTROINTESTINAL ENDOSCOPY     WRIST SURGERY Right    WRIST SURGERY     scar tissue removed   Family History  Problem Relation Age of Onset   Hypertension Mother    Stroke Mother    Pneumonia Father    Bladder Cancer Father    Cancer Maternal Aunt        ? type   Breast cancer Maternal Aunt  mets   Esophageal cancer Maternal Aunt        aunt ? esophageal ca   Stomach cancer Maternal Aunt        aunt ? stmach ca   Colon cancer Maternal Aunt        had colostomy - not sure of origin   Cancer Maternal Grandmother        ? type   Prostate cancer Neg Hx    Rectal cancer Neg Hx    Social History   Socioeconomic History   Marital status: Divorced    Spouse name: Not on file   Number of children: 1   Years of education: Not on file   Highest education level: Not on file  Occupational History   Occupation: Art therapist, retired 1997    Employer: [employer]  Tobacco Use   Smoking status: Former    Current packs/day: 0.00    Types: Cigarettes, Cigars    Quit date: 08/22/1998    Years since quitting: 24.8   Smokeless tobacco: Never   Tobacco comments:    cigarettes 1968, cigars 2000  Vaping Use   Vaping status: Never Used  Substance and Sexual Activity   Alcohol use: Not Currently    Alcohol/week: 0.0 standard drinks of alcohol    Comment: rare   Drug use: No   Sexual activity: Never  Other Topics Concern   Not on file  Social History Narrative   Retired from [employer]   From [place]   Prev divorced    1 son   Garret [place] '63-'67, no known agent orange exposure   Social Determinants of Corporate investment banker Strain: Low Risk  (07/20/2022)   Overall Financial Resource Strain (CARDIA)    Difficulty of Paying Living  Expenses: Not hard at all  Food Insecurity: No Food Insecurity (05/02/2023)   Hunger Vital Sign    Worried About Running Out of Food in the Last Year: Never true    Ran Out of Food in the Last Year: Never true  Transportation Needs: No Transportation Needs (05/02/2023)   PRAPARE - Administrator, Civil Service (Medical): No    Lack of Transportation (Non-Medical): No  Physical Activity: Insufficiently Active (07/20/2022)   Exercise Vital Sign    Days of Exercise per Week: 3 days    Minutes of Exercise per Session: 30 min  Stress: No Stress Concern Present (07/20/2022)   [employer] of Occupational Health - Occupational Stress Questionnaire    Feeling of Stress : Not at all  Social Connections: Socially Isolated (07/20/2022)   Social Connection and Isolation Panel [NHANES]    Frequency of Communication with Friends and Family: More than three times a week    Frequency of Social Gatherings with Friends and Family: Twice a week    Attends Religious Services: Never    Database administrator or Organizations: No    Attends Banker Meetings: Never    Marital Status: Divorced  Catering manager Violence: Not At Risk (05/02/2023)   Humiliation, Afraid, Rape, and Kick questionnaire    Fear of Current or Ex-Partner: No    Emotionally Abused: No    Physically Abused: No    Sexually Abused: No    Review of Systems Constitutional: Patient denies any unintentional weight loss or change in strength lntegumentary: Patient denies any rashes or pruritus Cardiovascular: Patient denies chest pain or syncope Respiratory: Patient denies shortness of breath Gastrointestinal: Patient denies nausea, vomiting, constipation, or  diarrhea Musculoskeletal: Per HPI; reports RLE muscle cramps today Neurologic: Per HPI Allergic/Immunologic: Patient denies recent allergic reaction(s) Hematologic/Lymphatic: Patient denies bleeding tendencies Endocrine: Patient denies heat/cold  intolerance  GU: As per HPI.  OBJECTIVE Vitals:   07/03/23 0906  BP: (!) 158/96  Pulse: 84  Temp: 98.2 F (36.8 C)   Body mass index is 24.07 kg/m.  Physical Examination Constitutional: No obvious distress; patient is non-toxic appearing  Cardiovascular: No visible lower extremity edema.  Respiratory: The patient does not have audible wheezing/stridor; respirations do not appear labored  Gastrointestinal: Abdomen non-distended Musculoskeletal: Normal ROM of UEs  Skin: No obvious rashes/open sores  Neurologic: CN 2-12 grossly intact; AOx3. His speech is notably clearer than last visit. Psychiatric: Answered questions appropriately with normal affect  Hematologic/Lymphatic/Immunologic: No obvious bruises or sites of spontaneous bleeding  GU: Catheter draining clear yellow urine.  ASSESSMENT Urinary retention  Foley catheter in place  History of hemorrhagic stroke with residual hemiplegia (HCC)  Ambulatory dysfunction  Benign prostatic hyperplasia with lower urinary tract symptoms, symptom details unspecified  We discussed his improved strength and mobility compared to last month; also has regained some sensation in his right lower extremity. We reviewed possible etiologies for his urinary retention including temporary detrusor areflexia or neurogenic bladder related to his stroke, BPH, constipation, anticholinergic medication use.   Discussed option to do voiding trial in office today, however based on his current mobility that would be challenging since we do not have gait belts or a Hoyer lift. Still unclear if he would be able to remove his clothing independently to use a handheld urinal. We therefore agreed to continue with indwelling Foley catheter for now. He was advised that the catheter should be exchanged at least once every 4 weeks to prevent clogging from sediment in the urine; will be due for next catheter exchange in about 10-14 days.  Patient was given two leg  bags for his catheter today; we discussed recommendation to use those during times of ambulation including PT to minimize fall risk and optimize his mobility.   OK to do voiding trial at his SNF if that is desired / thought to be appropriate per his provider(s) there; if so would proceed by removing catheter for 6-8 hours while increasing fluid intake and using bladder scanner to check his post-void residual (PVR). If he is unable to urinate at all or if his PVR is >200 ml then an indwelling Foley catheter should be reinserted.   Continue constipation management / prevention as needed; has orders for that already per SNF paperwork.  Continue Flomax 0.4 mg daily for BPH.   Will plan for follow up in 1 month and hopefully proceed with office voiding trial at that time if catheter is still present and if his mobility has continued to improve to the point that he feels confident about being able to stand & pivot and remove his clothing independently at that time.  Pt verbalized understanding and agreement. All questions were answered.   PLAN Advised the following: Continue with indwelling Foley catheter for urinary retention.  Continue Flomax 0.4 mg daily for BPH. Continue constipation management / prevention. SNF staff to exchange indwelling Foley catheter at least once every 4 weeks and more frequently if needed due to clogging. Continue Physical Therapy. Use leg catheter bag during during times of ambulation including Physical Therapy. If not already started, start Occupational Therapy (OT). Goal from Urology standpoint is to optimize self-care re: dressing / undressing for toileting. OK to  do voiding trial at SNF if desired / requested by provider(s) there (see above for details). Return in about 4 weeks (around 07/31/2023) for voiding trial & f/u with Evette Georges, NP.  No orders of the defined types were placed in this encounter.  Total time spent caring for the patient today was over 30  minutes. This includes time spent on the date of the visit reviewing the patient's chart before the visit, time spent during the visit, and time spent after the visit on documentation. Over 50% of that time was spent in face-to-face time with this patient for direct counseling. E&M based on time and complexity of medical decision making.  It has been explained that the patient is to follow regularly with their PCP in addition to all other providers involved in their care and to follow instructions provided by these respective offices. Patient advised to contact urology clinic if any urologic-pertaining questions, concerns, new symptoms or problems arise in the interim period.  There are no Patient Instructions on file for this visit.  Electronically signed by:  Donnita Falls, FNP   07/03/23    10:03 AM

## 2023-07-03 ENCOUNTER — Encounter: Payer: Self-pay | Admitting: Urology

## 2023-07-03 ENCOUNTER — Ambulatory Visit (INDEPENDENT_AMBULATORY_CARE_PROVIDER_SITE_OTHER): Payer: Medicare Other | Admitting: Urology

## 2023-07-03 VITALS — BP 158/96 | HR 84 | Temp 98.2°F | Ht 69.0 in | Wt 163.0 lb

## 2023-07-03 DIAGNOSIS — I69359 Hemiplegia and hemiparesis following cerebral infarction affecting unspecified side: Secondary | ICD-10-CM

## 2023-07-03 DIAGNOSIS — R339 Retention of urine, unspecified: Secondary | ICD-10-CM

## 2023-07-03 DIAGNOSIS — N401 Enlarged prostate with lower urinary tract symptoms: Secondary | ICD-10-CM | POA: Diagnosis not present

## 2023-07-03 DIAGNOSIS — R262 Difficulty in walking, not elsewhere classified: Secondary | ICD-10-CM

## 2023-07-03 DIAGNOSIS — Z978 Presence of other specified devices: Secondary | ICD-10-CM

## 2023-07-28 NOTE — Progress Notes (Unsigned)
Name: Mason [name] DOB: September 14, 1945 MRN: [id number]  History of Present Illness: Mr. Magsino is a 78 y.o. male who presents today for follow up visit at [facility]. He is accompanied by an [place] (med tech) from [facility] where he resides. - GU history:  1. BPH with BOO & LUTS (intermittent stream, hesitancy, urgency, nocturia). - 10/09/2022: CT abdomen/pelvis w/ contrast showed "Enlarged prostate measuring 5.4 x 4.4 x 5.6 cm".  - Taking Flomax 0.4 mg daily. 2. Elevated PSA. - No known family history of prostate cancer. - No prior prostate biopsy. 3. Prior distant episode of prostatitis. 4. Erectile dysfunction. Not significantly bothersome (not sexually active).   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 - RUS on 05/02/2023 showed no GU stones, masses, or hydronephrosis. [facility] course complicated by urinary retention which was attributed to bladder outlet obstruction secondary to constipation versus neurogenic bladder.    > 05/09/2023: - Failed voiding trial at SNF. Foley catheter in place. - Per note by Baker Pierini, Geriatric NP: "Hospital course was complicated by bladder outlet obstruction 2/2 constipation.Marland KitchenMarland KitchenFoley catheter remained in place at discharge due to failed bladder trials" despite resolution of constipation with medical management (Miralax and Senna). UA showed no evidence of UTI. Advised to continue Flomax 0.4 mg daily.    At initial visit on 06/05/2023: - Denied prior history of urinary retention. He states that prior to his stroke he was able to void "normally" with no problems.  - Doubt BPH as primary etiology given that he denies any significant lower urinary tract symptoms / voiding dysfunction  prior to his stroke.  - Doubt constipation as primary etiology as that has been / continues to be well managed via Senna PRN.  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 - Voiding trial deferred due to patient's immobility; currently doing PT/OT to regain strength and functionality. - The plan was: 1. Continue with indwelling Foley catheter for urinary retention.  2. Continue Flomax 0.4 mg daily for BPH. 3. Return in about 4 weeks (around 07/03/2023) for f/u with Evette Georges, NP; possible voiding trial if patient able to stand & pivot.  At last visit on 07/03/2023: Advised the following: Continue with indwelling Foley catheter for urinary retention.  Continue Flomax 0.4 mg daily for BPH. Continue constipation management / prevention. SNF staff to exchange indwelling Foley catheter at least once every 4 weeks and more frequently if needed due to clogging. Continue Physical Therapy. Use leg catheter bag during during times of ambulation including Physical Therapy. If not already started, start Occupational Therapy (OT). Goal from Urology standpoint is to optimize self-care re: dressing / undressing for toileting. OK to do voiding trial at SNF if desired / requested by provider(s) there (see above for details). Return in about 4 weeks (around 07/31/2023) for voiding trial & f/u with Evette Georges, NP.  Today: He reports that his catheter was removed at his SNF 10 days ago - it was clogged and replacement catheter was unavailable, so essentially he had a voiding trial. Since then he reports that he has been urinating well - when he feels the urge to void he is able to urinate volitionally without difficulty. He denies urinary incontinence, urgency, frequency, nocturia, dysuria, gross hematuria, hesitancy, straining  to void, or sensations of incomplete emptying.    Fall Screening: Do you usually have a device to assist in your mobility? Yes    Medications: Current Outpatient Medications  Medication Sig Dispense Refill   amLODipine (NORVASC) 10 MG tablet Take 1 tablet (10 mg total) by mouth daily.     apixaban (ELIQUIS) 5 MG TABS tablet Take 1 tablet (5 mg total) by mouth 2 (two) times daily. 60 tablet 1   b complex vitamins capsule Take 1 capsule by mouth daily.     bimatoprost (LUMIGAN) 0.01 % SOLN Place 1 drop into both eyes at bedtime.     Cholecalciferol (VITAMIN D) 125 MCG (5000 UT) CAPS Take 1 tablet by mouth daily.     esomeprazole (NEXIUM) 40 MG capsule Take 1 capsule by mouth twice daily 60 capsule 3   fluticasone (FLONASE) 50 MCG/ACT nasal spray Place 1 spray into both nostrils daily.     losartan (COZAAR) 25 MG tablet Take 1 tablet (25 mg total) by mouth daily.     LYRICA 25 MG capsule Take 25 mg by mouth 2 (two) times daily.     melatonin 3 MG TABS tablet Take 1 tablet (3 mg total) by mouth at bedtime as needed.     polyethylene glycol (MIRALAX / GLYCOLAX) 17 g packet Take 17 g by mouth 2 (two) times daily.     senna-docusate (SENOKOT-S) 8.6-50 MG tablet Take 1 tablet by mouth at bedtime as needed for mild constipation.     tamsulosin (FLOMAX) 0.4 MG CAPS capsule Take 1 capsule (0.4 mg total) by mouth daily after supper.     timolol (BETIMOL) 0.25 % ophthalmic solution Apply to eye.     timolol (TIMOPTIC-XR) 0.5 % ophthalmic gel-forming Place 1 drop into both eyes every morning.     traMADol (ULTRAM) 50 MG tablet Take 50 mg by mouth 3 (three) times daily.     TRAZODONE HCL PO Take 0.5 mg by mouth at bedtime.     vitamin C (ASCORBIC ACID) 500 MG tablet Take 500 mg by mouth daily.     zinc gluconate 50 MG tablet Take 50 mg by mouth daily.     No current facility-administered medications for this visit.    Allergies: Allergies  Allergen Reactions   Shellfish Allergy Shortness Of Breath, Rash and Other (See Comments)    GI upset Difficulty breathing  Reaction to shrimp and crab   Ak-Mycin [Erythromycin] Other  (See Comments)    GI upset   Celexa [Citalopram] Other (See Comments)    Aggression    Levsin [Hyoscyamine] Other (See Comments)    Urinary retention   Librax [Chlordiazepoxide-Clidinium] Other (See Comments)    Urinary retention   Mobic [Meloxicam] Other (See Comments)    GI upset    Past Medical History:  Diagnosis Date   Actinic keratosis 04/02/2013   L sup scapula - bx proven   Actinic keratosis 04/02/2013   R med ant deltoid - bx proven   Allergy    Anal fissure    Anxiety    Basal cell carcinoma    back - treated in the past    Basal cell carcinoma 05/02/2009   left supratip, Mohs   Cancer (HCC)    basal cell removed x4   Cataract    bil eyes   Depression    Diverticulosis of colon    GERD (gastroesophageal reflux disease)    esophageal narrowing   Glaucoma    Hiatal hernia  IBS (irritable bowel syndrome)    Insomnia    Internal hemorrhoids    Past Surgical History:  Procedure Laterality Date   ANAL SPHINCTEROTOMY  03/1995   fistulotomy   APPENDECTOMY     BASAL CELL CARCINOMA EXCISION     on back   BICEPT TENODESIS Right 09/27/2004   decompression   COLONOSCOPY  04/1999   internal hemmroids,12-11-08 colon tics and hems only    ESOPHAGOGASTRODUODENOSCOPY  04/1999   with esophagitis and stricture   MOHS SURGERY  09/02/2008   L supratip of nose with flap,basal cell cancer [facility]/MOHS   MOUTH SURGERY  01/2020   TONGUE SURGERY  2017   had small place removed, noncancerous   TONSILLECTOMY AND ADENOIDECTOMY     remote   UPPER GASTROINTESTINAL ENDOSCOPY     WRIST SURGERY Right    WRIST SURGERY     scar tissue removed   Family History  Problem Relation Age of Onset   Hypertension Mother    Stroke Mother    Pneumonia Father    Bladder Cancer Father    Cancer Maternal Aunt        ? type   Breast cancer Maternal Aunt        mets   Esophageal cancer Maternal Aunt        aunt ? esophageal ca   Stomach cancer Maternal Aunt        aunt ? stmach ca    Colon cancer Maternal Aunt        had colostomy - not sure of origin   Cancer Maternal Grandmother        ? type   Prostate cancer Neg Hx    Rectal cancer Neg Hx    Social History   Socioeconomic History   Marital status: Divorced    Spouse name: Not on file   Number of children: 1   Years of education: Not on file   Highest education level: Not on file  Occupational History   Occupation: Art therapist, retired 1997    Employer: [employer]  Tobacco Use   Smoking status: Former    Current packs/day: 0.00    Types: Cigarettes, Cigars    Quit date: 08/22/1998    Years since quitting: 24.9   Smokeless tobacco: Never   Tobacco comments:    cigarettes 1968, cigars 2000  Vaping Use   Vaping status: Never Used  Substance and Sexual Activity   Alcohol use: Not Currently    Alcohol/week: 0.0 standard drinks of alcohol    Comment: rare   Drug use: No   Sexual activity: Never  Other Topics Concern   Not on file  Social History Narrative   Retired from [place]   From [place]   Prev divorced    1 son   [place] '63-'67, no known agent orange exposure      Pt stays at [facility] Determinants of Health   Financial Resource Strain: Low Risk  (07/20/2022)   Overall Financial Resource Strain (CARDIA)    Difficulty of Paying Living Expenses: Not hard at all  Food Insecurity: No Food Insecurity (05/02/2023)   Hunger Vital Sign    Worried About Running Out of Food in the Last Year: Never true    Ran Out of Food in the Last Year: Never true  Transportation Needs: No Transportation Needs (05/02/2023)   PRAPARE - Administrator, Civil Service (Medical): No    Lack of Transportation (Non-Medical):  No  Physical Activity: Insufficiently Active (07/20/2022)   Exercise Vital Sign    Days of Exercise per Week: 3 days    Minutes of Exercise per Session: 30 min  Stress: No Stress Concern Present (07/20/2022)   [employer] of Occupational Health - Occupational  Stress Questionnaire    Feeling of Stress : Not at all  Social Connections: Socially Isolated (07/20/2022)   Social Connection and Isolation Panel [NHANES]    Frequency of Communication with Friends and Family: More than three times a week    Frequency of Social Gatherings with Friends and Family: Twice a week    Attends Religious Services: Never    Database administrator or Organizations: No    Attends Banker Meetings: Never    Marital Status: Divorced  Catering manager Violence: Not At Risk (05/02/2023)   Humiliation, Afraid, Rape, and Kick questionnaire    Fear of Current or Ex-Partner: No    Emotionally Abused: No    Physically Abused: No    Sexually Abused: No    Review of Systems Constitutional: Patient denies any unintentional weight loss or change in strength lntegumentary: Patient denies any rashes or pruritus Cardiovascular: Patient denies chest pain or syncope Respiratory: Patient denies shortness of breath Gastrointestinal: Patient denies nausea, vomiting, constipation, or diarrhea Musculoskeletal: Patient denies muscle cramps or weakness Neurologic: Patient denies convulsions or seizures Allergic/Immunologic: Patient denies recent allergic reaction(s) Hematologic/Lymphatic: Patient denies bleeding tendencies Endocrine: Patient denies heat/cold intolerance  GU: As per HPI.  OBJECTIVE Vitals:   08/01/23 1001  BP: 131/82  Pulse: (!) 102  Temp: 98.3 F (36.8 C)   There is no height or weight on file to calculate BMI.  Physical Examination Constitutional: No obvious distress; patient is non-toxic appearing  Cardiovascular: No visible lower extremity edema.  Respiratory: The patient does not have audible wheezing/stridor; respirations do not appear labored  Gastrointestinal: Abdomen non-distended Musculoskeletal: Right-sided weakness Skin: No obvious rashes/open sores  Neurologic: CN 2-12 grossly intact Psychiatric: Answered questions  appropriately with normal affect  Hematologic/Lymphatic/Immunologic: No obvious bruises or sites of spontaneous bleeding  UA: Patient unable to void during appointment (states he cannot relax enough to void while sitting in wheelchair and is currently unable to ambulate to toilet or exam table; no lift available in clinic). He reports that he last voided a few hours ago.  PVR: 249 ml  ASSESSMENT Benign prostatic hyperplasia, unspecified whether lower urinary tract symptoms present - Plan: BLADDER SCAN AMB NON-IMAGING, US RENAL  History of elevated PSA - Plan: BLADDER SCAN AMB NON-IMAGING  Urinary retention - Plan: BLADDER SCAN AMB NON-IMAGING, US RENAL  History of hemorrhagic stroke with residual hemiplegia (HCC) - Plan: BLADDER SCAN AMB NON-IMAGING, US RENAL  Ambulatory dysfunction - Plan: BLADDER SCAN AMB NON-IMAGING, US RENAL  He is doing well since catheter was removed about 10 days ago at [facility]. We agreed to leave catheter out since his PVR is reasonable given that he last voided a few hours ago. Advised RUS within 1-2 weeks to screen for hydronephrosis s/p catheter removal. Advised to continue Flomax 0.4 mg daily for BPH. Will plan for follow up in 3 months or sooner if needed. Pt verbalized understanding and agreement. All questions were answered.  PLAN Advised the following: 1. RUS within 1 week. 2. Continue Flomax 0.4 mg daily.  3. Return in about 3 months (around 10/30/2023) for UA, PVR, & f/u with Evette Georges NP.  Orders Placed This Encounter  Procedures   US  RENAL    Standing Status:   Future    Standing Expiration Date:   07/31/2024    Order Specific Question:   Reason for Exam (SYMPTOM  OR DIAGNOSIS REQUIRED)    Answer:   BPH with urinary retention; screen for hydronephrosis    Order Specific Question:   Preferred imaging location?    Answer:   [facility]   BLADDER SCAN AMB NON-IMAGING   It has been explained that the patient is to follow regularly with  their PCP in addition to all other providers involved in their care and to follow instructions provided by these respective offices. Patient advised to contact urology clinic if any urologic-pertaining questions, concerns, new symptoms or problems arise in the interim period.  There are no Patient Instructions on file for this visit.  Electronically signed by:  Donnita Falls, FNP   08/01/23    10:57 AM

## 2023-07-31 ENCOUNTER — Ambulatory Visit (INDEPENDENT_AMBULATORY_CARE_PROVIDER_SITE_OTHER): Payer: Medicare Other | Admitting: Neurology

## 2023-07-31 ENCOUNTER — Encounter: Payer: Self-pay | Admitting: Neurology

## 2023-07-31 VITALS — BP 130/87 | HR 107 | Ht 69.0 in | Wt 160.0 lb

## 2023-07-31 DIAGNOSIS — G8110 Spastic hemiplegia affecting unspecified side: Secondary | ICD-10-CM | POA: Diagnosis not present

## 2023-07-31 DIAGNOSIS — I482 Chronic atrial fibrillation, unspecified: Secondary | ICD-10-CM

## 2023-07-31 DIAGNOSIS — I610 Nontraumatic intracerebral hemorrhage in hemisphere, subcortical: Secondary | ICD-10-CM | POA: Diagnosis not present

## 2023-07-31 MED ORDER — APIXABAN 5 MG PO TABS: 5.0000 mg | ORAL_TABLET | Freq: Two times a day (BID) | ORAL | 1 refills | Status: DC

## 2023-07-31 NOTE — Progress Notes (Signed)
[facility] [address] [place]. [place] [address] [phone]       OFFICE FOLLOW-UP NOTE  [name] Date of Birth:  08-07-1946 Medical Record Number:  [id number]   HPI: [name] is a 77 year old Caucasian male seen today for initial office follow-up visit following hospital admission for intracerebral hemorrhage in September 2024.  Patient is accompanied by aide from his [facility] assisted living facility in [place] where he is currently staying.  History is obtained from them and review of electronic medical records and I personally reviewed pertinent available imaging films in PACS.  He has past medical history of atrial fibrillation on Xarelto, anxiety, basal cell carcinoma, depression, gastroesophageal reflux disease and colon diverticulosis.  He presented on 04/25/2023 with right-sided weakness.  He was not sure if it taken his dose of Xarelto at night or not.  CT scan of the head on admission showed a 2.6 x 1.6 x 1.3 cm estimated volume of 3 cubic cc acute parenchymal hemorrhage involving the left thalamus without any intraventricular extension and hydrocephalus.  Follow-up CT scan showed slight increase in the hemorrhage size but without significant clinical worsening.  He was admitted to the ICU and blood pressure was tightly controlled.  CT angiogram of the head and neck showed high-grade stenosis of mid right P2 segment but no aneurysm.  CT chest showed nonocclusive filling defects in 2 segmental right upper lobe pulmonary artery suspicious for acute pulmonary embolism.  MRI scan of the brain shows stable appearance of the 2 x 2.3 x 3 cm estimated volume 7 mL left thalamic capsular hemorrhage without intraventricular extension hydrocephalus.  2D echo showed ejection fraction of 60 to 65%.  LDL cholesterol 59 mg percent.  Hemoglobin A1c was 6.0.  Urine drug screen was negative.  Lower extremity venous duplex was negative for DVT.  Patient was on Xarelto  prior to admission which was stopped.  He was transferred to inpatient rehab.  Patient is currently at [facility] assisted living facility in [place].  He has still significant right hemiplegia and is barely able to even stand up with the help of the therapist..  He is getting physical occupational therapy.  He can speak and swallow very well.  He has only trace movement in the right elbow and hand. ROS:   14 system review of systems is positive for weakness, difficulty walking all other systems negative  PMH:  Past Medical History:  Diagnosis Date   Actinic keratosis 04/02/2013   L sup scapula - bx proven   Actinic keratosis 04/02/2013   R med ant deltoid - bx proven   Allergy    Anal fissure    Anxiety    Basal cell carcinoma    back - treated in the past    Basal cell carcinoma 05/02/2009   left supratip, Mohs   Cancer (HCC)    basal cell removed x4   Cataract    bil eyes   Depression    Diverticulosis of colon    GERD (gastroesophageal reflux disease)    esophageal narrowing   Glaucoma    Hiatal hernia    IBS (irritable bowel syndrome)    Insomnia    Internal hemorrhoids     Social History:  Social History   Socioeconomic History   Marital status: Divorced    Spouse name: Not on file   Number of children: 1   Years of education: Not on file   Highest education level: Not on  file  Occupational History   Occupation: Art therapist, retired Location manager: [employer]  Tobacco Use   Smoking status: Former    Current packs/day: 0.00    Types: Cigarettes, Cigars    Quit date: 08/22/1998    Years since quitting: 24.9   Smokeless tobacco: Never   Tobacco comments:    cigarettes 1968, cigars 2000  Vaping Use   Vaping status: Never Used  Substance and Sexual Activity   Alcohol use: Not Currently    Alcohol/week: 0.0 standard drinks of alcohol    Comment: rare   Drug use: No   Sexual activity: Never  Other Topics Concern   Not on file  Social  History Narrative   Retired from [employer]   From [place]   Prev divorced    1 son   [place] [place] '63-'67, no known agent orange exposure      Pt stays at [facility] Determinants of Health   Financial Resource Strain: Low Risk  (07/20/2022)   Overall Financial Resource Strain (CARDIA)    Difficulty of Paying Living Expenses: Not hard at all  Food Insecurity: No Food Insecurity (05/02/2023)   Hunger Vital Sign    Worried About Running Out of Food in the Last Year: Never true    Ran Out of Food in the Last Year: Never true  Transportation Needs: No Transportation Needs (05/02/2023)   PRAPARE - Administrator, Civil Service (Medical): No    Lack of Transportation (Non-Medical): No  Physical Activity: Insufficiently Active (07/20/2022)   Exercise Vital Sign    Days of Exercise per Week: 3 days    Minutes of Exercise per Session: 30 min  Stress: No Stress Concern Present (07/20/2022)   [employer] of Occupational Health - Occupational Stress Questionnaire    Feeling of Stress : Not at all  Social Connections: Socially Isolated (07/20/2022)   Social Connection and Isolation Panel [NHANES]    Frequency of Communication with Friends and Family: More than three times a week    Frequency of Social Gatherings with Friends and Family: Twice a week    Attends Religious Services: Never    Database administrator or Organizations: No    Attends Banker Meetings: Never    Marital Status: Divorced  Catering manager Violence: Not At Risk (05/02/2023)   Humiliation, Afraid, Rape, and Kick questionnaire    Fear of Current or Ex-Partner: No    Emotionally Abused: No    Physically Abused: No    Sexually Abused: No    Medications:   Current Outpatient Medications on File Prior to Visit  Medication Sig Dispense Refill   amLODipine (NORVASC) 10 MG tablet Take 1 tablet (10 mg total) by mouth daily.     b complex vitamins capsule Take 1 capsule by mouth daily.      bimatoprost (LUMIGAN) 0.01 % SOLN Place 1 drop into both eyes at bedtime.     Cholecalciferol (VITAMIN D) 125 MCG (5000 UT) CAPS Take 1 tablet by mouth daily.     esomeprazole (NEXIUM) 40 MG capsule Take 1 capsule by mouth twice daily 60 capsule 3   fluticasone (FLONASE) 50 MCG/ACT nasal spray Place 1 spray into both nostrils daily.     losartan (COZAAR) 25 MG tablet Take 1 tablet (25 mg total) by mouth daily.     LYRICA 25 MG capsule Take 25 mg by mouth 2 (two) times daily.  melatonin 3 MG TABS tablet Take 1 tablet (3 mg total) by mouth at bedtime as needed.     polyethylene glycol (MIRALAX / GLYCOLAX) 17 g packet Take 17 g by mouth 2 (two) times daily.     senna-docusate (SENOKOT-S) 8.6-50 MG tablet Take 1 tablet by mouth at bedtime as needed for mild constipation.     tamsulosin (FLOMAX) 0.4 MG CAPS capsule Take 1 capsule (0.4 mg total) by mouth daily after supper.     timolol (BETIMOL) 0.25 % ophthalmic solution Apply to eye.     timolol (TIMOPTIC-XR) 0.5 % ophthalmic gel-forming Place 1 drop into both eyes every morning.     traMADol (ULTRAM) 50 MG tablet Take 50 mg by mouth 3 (three) times daily.     TRAZODONE HCL PO Take 0.5 mg by mouth at bedtime.     vitamin C (ASCORBIC ACID) 500 MG tablet Take 500 mg by mouth daily.     zinc gluconate 50 MG tablet Take 50 mg by mouth daily.     No current facility-administered medications on file prior to visit.    Allergies:   Allergies  Allergen Reactions   Shellfish Allergy Shortness Of Breath, Rash and Other (See Comments)    GI upset Difficulty breathing  Reaction to shrimp and crab   Ak-Mycin [Erythromycin] Other (See Comments)    GI upset   Celexa [Citalopram] Other (See Comments)    Aggression    Levsin [Hyoscyamine] Other (See Comments)    Urinary retention   Librax [Chlordiazepoxide-Clidinium] Other (See Comments)    Urinary retention   Mobic [Meloxicam] Other (See Comments)    GI upset    Physical Exam General: Frail  elderly Caucasian male, seated, in no evident distress Head: head normocephalic and atraumatic.  Neck: supple with no carotid or supraclavicular bruits Cardiovascular: regular rate and rhythm, no murmurs Musculoskeletal: no deformity Skin:  no rash/petichiae Vascular:  Normal pulses all extremities Vitals:   07/31/23 0922  BP: 130/87  Pulse: (!) 107   Neurologic Exam Mental Status: Awake and fully alert. Oriented to place and time. Recent and remote memory intact. Attention span, concentration and fund of knowledge appropriate. Mood and affect appropriate.  Cranial Nerves: Fundoscopic exam reveals sharp disc margins. Pupils equal, briskly reactive to light. Extraocular movements full without nystagmus. Visual fields show partial right homonymous hemianopsia to confrontation. Hearing intact. Facial sensation intact.  Mild right lower facial weakness.  Tongue, palate moves normally and symmetrically.  Motor: Dense right hemiplegia with only grade 1/5 strength in right upper extremity and right lower extremity.   Sensory.:  Diminished ouch ,pinprick .position and vibratory sensation on the right side..  Coordination: Rapid alternating movements normal in all extremities. Finger-to-nose and heel-to-shin performed accurately bilaterally. Gait and Station: Sitting in a wheelchair.  Unable to walk..  Reflexes: 2+ and asymmetric and brisker on the right. Toes downgoing.   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  He has significant residual spastic right hemiplegia with poor baseline functioning.     PLAN:I had a long d/w patient  and his aide from SNF about his recent intracerebral hemorrhage, atrial fibrillation, risk for recurrent stroke/TIAs, personally independently reviewed imaging studies and stroke evaluation results and  answered questions.I recommend he start Eliquis 5 mg twice daily for secondary stroke prevention given history of atrial fibrillation even though he had  recent intracerebral hemorrhage on Xarelto as risk-benefit of grouping strokes on anticoagulation is more in favor rather than increased risk for repeat intracerebral hemorrhage and maintain strict control of hypertension with blood pressure goal below 130/90, diabetes with hemoglobin A1c goal below 6.5% and lipids with LDL cholesterol goal below 70 mg/dL. I also advised the patient to eat a healthy diet with plenty of whole grains, cereals, fruits and vegetables, exercise regularly and maintain ideal body weight I recommend he continue ongoing physical and Occupational Therapy.  Patient is not a candidate for Watchman device due to significant functional residual disability at the present time.  Followup in the future with my nurse practitioner in 6 months or call earlier if necessary.  Greater than 50% of time during this 40 minute visit was spent on counseling,explanation of diagnosis of intracerebral hemorrhage, planning of further management, discussion with patient and family and coordination of care Delia Heady, MD Note: This document was prepared with digital dictation and possible smart phrase technology. Any transcriptional errors that result from this process are unintentional

## 2023-07-31 NOTE — Patient Instructions (Signed)
I had a long d/w patient  and his aide from SNF about his recent intracerebral hemorrhage, atrial fibrillation, risk for recurrent stroke/TIAs, personally independently reviewed imaging studies and stroke evaluation results and answered questions.I recommend he start Eliquis 5 mg twice daily for secondary stroke prevention given history of atrial fibrillation even though he had recent intracerebral hemorrhage on Xarelto as risk-benefit of grouping strokes on anticoagulation is more in favor rather than increased risk for repeat intracerebral hemorrhage and maintain strict control of hypertension with blood pressure goal below 130/90, diabetes with hemoglobin A1c goal below 6.5% and lipids with LDL cholesterol goal below 70 mg/dL. I also advised the patient to eat a healthy diet with plenty of whole grains, cereals, fruits and vegetables, exercise regularly and maintain ideal body weight I recommend he continue ongoing physical and Occupational Therapy.  Patient is not a candidate for Watchman device due to significant functional residual disability at the present time.  Followup in the future with my nurse practitioner in 6 months or call earlier if necessary.

## 2023-08-01 ENCOUNTER — Encounter: Payer: Self-pay | Admitting: Urology

## 2023-08-01 ENCOUNTER — Ambulatory Visit (INDEPENDENT_AMBULATORY_CARE_PROVIDER_SITE_OTHER): Payer: Medicare Other | Admitting: Urology

## 2023-08-01 VITALS — BP 131/82 | HR 102 | Temp 98.3°F

## 2023-08-01 DIAGNOSIS — Z978 Presence of other specified devices: Secondary | ICD-10-CM

## 2023-08-01 DIAGNOSIS — Z87898 Personal history of other specified conditions: Secondary | ICD-10-CM | POA: Diagnosis not present

## 2023-08-01 DIAGNOSIS — R262 Difficulty in walking, not elsewhere classified: Secondary | ICD-10-CM

## 2023-08-01 DIAGNOSIS — I69359 Hemiplegia and hemiparesis following cerebral infarction affecting unspecified side: Secondary | ICD-10-CM

## 2023-08-01 DIAGNOSIS — R339 Retention of urine, unspecified: Secondary | ICD-10-CM

## 2023-08-01 DIAGNOSIS — N400 Enlarged prostate without lower urinary tract symptoms: Secondary | ICD-10-CM | POA: Diagnosis not present

## 2023-08-01 LAB — BLADDER SCAN AMB NON-IMAGING: Scan Result: 249

## 2023-08-04 ENCOUNTER — Ambulatory Visit (HOSPITAL_COMMUNITY)
Admission: RE | Admit: 2023-08-04 | Discharge: 2023-08-04 | Disposition: A | Discharge status: Home / Self Care | Payer: Medicare Other | Source: Ambulatory Visit | Attending: Urology | Admitting: Urology

## 2023-08-04 DIAGNOSIS — R339 Retention of urine, unspecified: Secondary | ICD-10-CM | POA: Insufficient documentation

## 2023-08-04 DIAGNOSIS — N400 Enlarged prostate without lower urinary tract symptoms: Secondary | ICD-10-CM | POA: Diagnosis present

## 2023-08-04 DIAGNOSIS — N319 Neuromuscular dysfunction of bladder, unspecified: Secondary | ICD-10-CM | POA: Diagnosis not present

## 2023-08-04 DIAGNOSIS — I69351 Hemiplegia and hemiparesis following cerebral infarction affecting right dominant side: Secondary | ICD-10-CM | POA: Diagnosis not present

## 2023-08-04 DIAGNOSIS — I69359 Hemiplegia and hemiparesis following cerebral infarction affecting unspecified side: Secondary | ICD-10-CM | POA: Diagnosis present

## 2023-08-04 DIAGNOSIS — R262 Difficulty in walking, not elsewhere classified: Secondary | ICD-10-CM | POA: Diagnosis not present

## 2023-08-04 DIAGNOSIS — N401 Enlarged prostate with lower urinary tract symptoms: Secondary | ICD-10-CM | POA: Diagnosis not present

## 2023-08-08 ENCOUNTER — Telehealth: Payer: Self-pay

## 2023-08-08 NOTE — Telephone Encounter (Signed)
-----   Message from Mason [name] sent at 08/07/2023  9:02 AM EST ----- Please let pt know RUS was normal - no stones, masses, or hydronephrosis. Thanks.

## 2023-08-08 NOTE — Telephone Encounter (Signed)
Called [name] made patient's nurse Crystal aware and she voiced understanding.

## 2023-08-08 NOTE — Telephone Encounter (Signed)
Tried calling patient with no answer, unable to leave vm due to mailbox not setup.

## 2023-08-31 ENCOUNTER — Emergency Department (HOSPITAL_COMMUNITY)
Admission: EM | Admit: 2023-08-31 | Discharge: 2023-08-31 | Disposition: A | Discharge status: Home / Self Care | Payer: Medicare Other | Attending: Emergency Medicine | Admitting: Emergency Medicine

## 2023-08-31 ENCOUNTER — Other Ambulatory Visit: Payer: Self-pay

## 2023-08-31 ENCOUNTER — Encounter (HOSPITAL_COMMUNITY): Payer: Self-pay | Admitting: Emergency Medicine

## 2023-08-31 ENCOUNTER — Emergency Department (HOSPITAL_COMMUNITY): Payer: Medicare Other

## 2023-08-31 DIAGNOSIS — Z79899 Other long term (current) drug therapy: Secondary | ICD-10-CM | POA: Diagnosis not present

## 2023-08-31 DIAGNOSIS — S0990XA Unspecified injury of head, initial encounter: Secondary | ICD-10-CM | POA: Diagnosis present

## 2023-08-31 DIAGNOSIS — M25511 Pain in right shoulder: Secondary | ICD-10-CM | POA: Diagnosis not present

## 2023-08-31 DIAGNOSIS — W19XXXA Unspecified fall, initial encounter: Secondary | ICD-10-CM

## 2023-08-31 DIAGNOSIS — W06XXXA Fall from bed, initial encounter: Secondary | ICD-10-CM | POA: Insufficient documentation

## 2023-08-31 DIAGNOSIS — Z7901 Long term (current) use of anticoagulants: Secondary | ICD-10-CM | POA: Insufficient documentation

## 2023-08-31 NOTE — ED Triage Notes (Signed)
 Patient arrives to triage via EMS.  Patient had a fall out of bed yesterday around 1430. +head strike, +thinners, pt on eliquis .  Patient states that he is being sent here from living facility for MRI.  Patient alert and oriented at time of triage, with no complaints.   Hx of stroke, right sided deficits from previous strokes

## 2023-08-31 NOTE — ED Notes (Signed)
 [facility] called for transport back to [facility].

## 2023-08-31 NOTE — ED Provider Notes (Signed)
 [place] EMERGENCY DEPARTMENT AT [facility] Provider Note   CSN: [id number] Arrival date & time: 08/31/23  1117     History  Chief Complaint  Patient presents with   Mason [name] is a 78 y.o. male.  Patient with history of afib on Eliquis , hemorrhagic stroke with right-sided hemiplegia, GERD presents today with complaints of fall. He states that same occurred yesterday afternoon around 2:30 PM when he was being transferred from his bed to the chair and slid off into the floor.  He did hit his head.  He did not lose consciousness.  He is endorsing pain to his right shoulder as well as his head.  States that he had a negative x-ray at his facility yesterday evening, however they recommended that he be transferred here for imaging of his head given that he is on anticoagulation. Patient states that he struck the right side of his face, states he has diminished sensation there at baseline from his stroke. Denies any changes in his vision, nausea, or vomiting. No other injuries or complaints.   The history is provided by the patient. No language interpreter was used.  Fall       Home Medications Prior to Admission medications   Medication Sig Start Date End Date Taking? Authorizing Provider  amLODipine  (NORVASC ) 10 MG tablet Take 1 tablet (10 mg total) by mouth daily. 05/05/23   Samtani, Jai-Gurmukh, MD  apixaban  (ELIQUIS ) 5 MG TABS tablet Take 1 tablet (5 mg total) by mouth 2 (two) times daily. 07/31/23   Sethi, Pramod S, MD  b complex vitamins capsule Take 1 capsule by mouth daily.    [provider]  bimatoprost (LUMIGAN) 0.01 % SOLN Place 1 drop into both eyes at bedtime.    [provider]  Cholecalciferol (VITAMIN D) 125 MCG (5000 UT) CAPS Take 1 tablet by mouth daily.    [provider]  esomeprazole  (NEXIUM ) 40 MG capsule Take 1 capsule by mouth twice daily 02/08/23   Cleatus Arlyss RAMAN, MD  fluticasone  (FLONASE ) 50 MCG/ACT nasal  spray Place 1 spray into both nostrils daily. 05/05/23   Samtani, Jai-Gurmukh, MD  losartan  (COZAAR ) 25 MG tablet Take 1 tablet (25 mg total) by mouth daily. 05/05/23   Samtani, Jai-Gurmukh, MD  LYRICA  25 MG capsule Take 25 mg by mouth 2 (two) times daily. 06/05/23   [provider]  melatonin 3 MG TABS tablet Take 1 tablet (3 mg total) by mouth at bedtime as needed. 05/05/23   Samtani, Jai-Gurmukh, MD  polyethylene glycol (MIRALAX  / GLYCOLAX ) 17 g packet Take 17 g by mouth 2 (two) times daily. 05/05/23   Samtani, Jai-Gurmukh, MD  senna-docusate (SENOKOT-S) 8.6-50 MG tablet Take 1 tablet by mouth at bedtime as needed for mild constipation. 05/05/23   Samtani, Jai-Gurmukh, MD  tamsulosin  (FLOMAX ) 0.4 MG CAPS capsule Take 1 capsule (0.4 mg total) by mouth daily after supper. 05/05/23   Samtani, Jai-Gurmukh, MD  timolol  (BETIMOL ) 0.25 % ophthalmic solution Apply to eye. 06/08/20   [provider]  timolol  (TIMOPTIC -XR) 0.5 % ophthalmic gel-forming Place 1 drop into both eyes every morning. 07/15/19   [provider]  traMADol  (ULTRAM ) 50 MG tablet Take 50 mg by mouth 3 (three) times daily. 07/17/23   [provider]  TRAZODONE  HCL PO Take 0.5 mg by mouth at bedtime.    [provider]  vitamin C (ASCORBIC ACID) 500 MG tablet Take 500 mg by mouth daily.  [provider]  zinc gluconate 50 MG tablet Take 50 mg by mouth daily.    [provider]      Allergies    Shellfish allergy, Ak-mycin [erythromycin], Celexa  [citalopram ], Levsin [hyoscyamine], Librax [chlordiazepoxide-clidinium], and Mobic  [meloxicam ]    Review of Systems   Review of Systems  All other systems reviewed and are negative.   Physical Exam Updated Vital Signs BP 131/84 (BP Location: Left Arm)   Pulse 74   Temp 98 F (36.7 C) (Oral)   Resp 18   SpO2 97%  Physical Exam Vitals and nursing note reviewed.  Constitutional:      General: He is not in acute distress.     Appearance: Normal appearance. He is normal weight. He is not ill-appearing, toxic-appearing or diaphoretic.  HENT:     Head: Normocephalic and atraumatic.     Comments: No racoon eyes No battle sign Eyes:     Extraocular Movements: Extraocular movements intact.     Pupils: Pupils are equal, round, and reactive to light.  Cardiovascular:     Rate and Rhythm: Normal rate.     Comments: No tenderness to palpation of the anterior chest wall Pulmonary:     Effort: Pulmonary effort is normal. No respiratory distress.  Abdominal:     General: Abdomen is flat.     Palpations: Abdomen is soft.     Tenderness: There is no abdominal tenderness.     Comments: No abdominal tenderness or bruising  Musculoskeletal:        General: Normal range of motion.     Cervical back: Normal, normal range of motion and neck supple.     Thoracic back: Normal.     Lumbar back: Normal.     Comments: TTP noted to the right shoulder without swelling, bruising or deformity. ROM limited due to hemiplegia.   No tenderness to palpation of cervical, thoracic, or lumbar spine.  No step-offs, lesions, deformity, or overlying skin changes.  Skin:    General: Skin is warm and dry.  Neurological:     General: No focal deficit present.     Mental Status: He is alert and oriented to person, place, and time.     GCS: GCS eye subscore is 4. GCS verbal subscore is 5. GCS motor subscore is 6.     Comments: Right sided hemiplegia at baseline  Psychiatric:        Mood and Affect: Mood normal.        Behavior: Behavior normal.     ED Results / Procedures / Treatments   Labs (all labs ordered are listed, but only abnormal results are displayed) Labs Reviewed - No data to display  EKG None  Radiology CT Head Wo Contrast Result Date: 08/31/2023 CLINICAL DATA:  Provided history: Head trauma, minor. Neck trauma. Additional history provided: Fall (with head strike), on blood thinners EXAM: CT HEAD WITHOUT CONTRAST CT  CERVICAL SPINE WITHOUT CONTRAST TECHNIQUE: Multidetector CT imaging of the head and cervical spine was performed following the standard protocol without intravenous contrast. Multiplanar CT image reconstructions of the cervical spine were also generated. RADIATION DOSE REDUCTION: This exam was performed according to the departmental dose-optimization program which includes automated exposure control, adjustment of the mA and/or kV according to patient size and/or use of iterative reconstruction technique. COMPARISON:  Brain MRI 04/27/2023. Head CT 04/26/2023. Cervical spine CT 05/06/2018. FINDINGS: CT HEAD FINDINGS Brain: Generalized cerebral atrophy. Chronic insult at the left thalamocapsular junction/corona radiata, from prior parenchymal  hemorrhage. Small chronic cortical infarct again noted within the anteromedial left frontal lobe (ACA vascular territory) (series 2, image 16). Known small chronic infarcts within the right parietal and occipital cortex, and bilateral cerebellar hemispheres, occult by CT and better appreciated on the prior brain MRI of 04/27/2023 (acute at that time). Background moderate patchy and ill-defined hypoattenuation within the cerebral white matter, nonspecific but compatible chronic small vessel disease. There is no acute intracranial hemorrhage. No acute demarcated cortical infarct. No extra-axial fluid collection. No evidence of an intracranial mass. No midline shift. Vascular: No hyperdense vessel.  Atherosclerotic calcifications. Skull: No calvarial fracture or aggressive osseous lesion. Sinuses/Orbits: No mass or acute finding within the imaged orbits. No significant paranasal sinus disease at the imaged levels. CT CERVICAL SPINE FINDINGS Alignment: Nonspecific straightening of the expected cervical lordosis. Levocurvature of the cervical spine. 2 mm C7-T1 grade 1 anterolisthesis. Skull base and vertebrae: The basion-dental and atlanto-dental intervals are maintained.No evidence  of acute fracture to the cervical spine. Soft tissues and spinal canal: No prevertebral fluid or swelling. No visible canal hematoma. Disc levels: Cervical spondylosis with multilevel disc space narrowing, disc bulges/central disc protrusions, posterior disc osteophyte complexes, endplate spurring and uncovertebral hypertrophy. Disc space narrowing is greatest at C4-C5 (moderate-to-advanced), C5-C6 (advanced) and C6-C7 (advanced). No appreciable high-grade spinal canal stenosis. Multilevel bony neural foraminal narrowing. Upper chest: No acute finding. IMPRESSION: CT head: 1. No evidence of an acute intracranial abnormality. 2. Chronic insult at the left thalamocapsular junction/corona radiata, from prior parenchymal hemorrhage. 3. Background parenchymal atrophy, chronic small vessel ischemic disease and chronic infarcts, as described. CT cervical spine: 1. No evidence of an acute cervical spine fracture. 2. 2 mm C7-T1 grade 1 anterolisthesis. 3. Nonspecific straightening of the expected cervical lordosis. 4. Levocurvature of the cervical spine. 5. Cervical spondylosis as described. Electronically Signed   By: Rockey Childs D.O.   On: 08/31/2023 13:29   CT Cervical Spine Wo Contrast Result Date: 08/31/2023 CLINICAL DATA:  Provided history: Head trauma, minor. Neck trauma. Additional history provided: Fall (with head strike), on blood thinners EXAM: CT HEAD WITHOUT CONTRAST CT CERVICAL SPINE WITHOUT CONTRAST TECHNIQUE: Multidetector CT imaging of the head and cervical spine was performed following the standard protocol without intravenous contrast. Multiplanar CT image reconstructions of the cervical spine were also generated. RADIATION DOSE REDUCTION: This exam was performed according to the departmental dose-optimization program which includes automated exposure control, adjustment of the mA and/or kV according to patient size and/or use of iterative reconstruction technique. COMPARISON:  Brain MRI 04/27/2023.  Head CT 04/26/2023. Cervical spine CT 05/06/2018. FINDINGS: CT HEAD FINDINGS Brain: Generalized cerebral atrophy. Chronic insult at the left thalamocapsular junction/corona radiata, from prior parenchymal hemorrhage. Small chronic cortical infarct again noted within the anteromedial left frontal lobe (ACA vascular territory) (series 2, image 16). Known small chronic infarcts within the right parietal and occipital cortex, and bilateral cerebellar hemispheres, occult by CT and better appreciated on the prior brain MRI of 04/27/2023 (acute at that time). Background moderate patchy and ill-defined hypoattenuation within the cerebral white matter, nonspecific but compatible chronic small vessel disease. There is no acute intracranial hemorrhage. No acute demarcated cortical infarct. No extra-axial fluid collection. No evidence of an intracranial mass. No midline shift. Vascular: No hyperdense vessel.  Atherosclerotic calcifications. Skull: No calvarial fracture or aggressive osseous lesion. Sinuses/Orbits: No mass or acute finding within the imaged orbits. No significant paranasal sinus disease at the imaged levels. CT CERVICAL SPINE FINDINGS Alignment: Nonspecific straightening of the expected  cervical lordosis. Levocurvature of the cervical spine. 2 mm C7-T1 grade 1 anterolisthesis. Skull base and vertebrae: The basion-dental and atlanto-dental intervals are maintained.No evidence of acute fracture to the cervical spine. Soft tissues and spinal canal: No prevertebral fluid or swelling. No visible canal hematoma. Disc levels: Cervical spondylosis with multilevel disc space narrowing, disc bulges/central disc protrusions, posterior disc osteophyte complexes, endplate spurring and uncovertebral hypertrophy. Disc space narrowing is greatest at C4-C5 (moderate-to-advanced), C5-C6 (advanced) and C6-C7 (advanced). No appreciable high-grade spinal canal stenosis. Multilevel bony neural foraminal narrowing. Upper chest: No  acute finding. IMPRESSION: CT head: 1. No evidence of an acute intracranial abnormality. 2. Chronic insult at the left thalamocapsular junction/corona radiata, from prior parenchymal hemorrhage. 3. Background parenchymal atrophy, chronic small vessel ischemic disease and chronic infarcts, as described. CT cervical spine: 1. No evidence of an acute cervical spine fracture. 2. 2 mm C7-T1 grade 1 anterolisthesis. 3. Nonspecific straightening of the expected cervical lordosis. 4. Levocurvature of the cervical spine. 5. Cervical spondylosis as described. Electronically Signed   By: Rockey Childs D.O.   On: 08/31/2023 13:29    Procedures Procedures    Medications Ordered in ED Medications - No data to display  ED Course/ Medical Decision Making/ A&P                                 Medical Decision Making  Patient presents today with complaints of fall yesterday on Eliquis .  He is afebrile, non-toxic appearing, and in no acute distress reassuring vital signs.  He is also alert and oriented and neurologically intact without focal deficits other than his right sided hemiplegia which is baseline from previous hemorrhagic stroke.  Given fall on thinners with head injury, CT imaging obtained which has resulted and reveals  CT head:   1. No evidence of an acute intracranial abnormality. 2. Chronic insult at the left thalamocapsular junction/corona radiata, from prior parenchymal hemorrhage. 3. Background parenchymal atrophy, chronic small vessel ischemic disease and chronic infarcts, as described.   CT cervical spine:   1. No evidence of an acute cervical spine fracture. 2. 2 mm C7-T1 grade 1 anterolisthesis. 3. Nonspecific straightening of the expected cervical lordosis. 4. Levocurvature of the cervical spine. 5. Cervical spondylosis as described.  I have personally reviewed and interpreted this imaging and agree with radiology interpretation.  Patient without signs of serious head, neck, or  back injury. No midline spinal tenderness or TTP of the chest or abd.  No concern for closed head injury, lung injury, or intraabdominal injury.  Pt is hemodynamically stable, in NAD.  Discussed s/s that should cause them to return. Patient instructed on NSAID use. Evaluation and diagnostic testing in the emergency department does not suggest an emergent condition requiring admission or immediate intervention beyond what has been performed at this time.  Plan for discharge with close PCP follow-up.  Patient is understanding and amenable with plan, educated on red flag symptoms that would prompt immediate return.  Patient discharged in stable condition.  Final Clinical Impression(s) / ED Diagnoses Final diagnoses:  Fall, initial encounter    Rx / DC Orders ED Discharge Orders     None     An After Visit Summary was printed and given to the patient.     Nora Lauraine DELENA DEVONNA 08/31/23 1358    Suzette Pac, MD 09/04/23 1059

## 2023-08-31 NOTE — Discharge Instructions (Signed)
 As we discussed, your workup in the ER today was reassuring for acute findings.  CT imaging of your head and neck did not reveal any emergent concerns.  I recommend following up closely with your primary doctor in the next few days.  Return if development of any new or worsening symptoms.

## 2023-09-08 ENCOUNTER — Telehealth: Payer: Self-pay

## 2023-09-08 NOTE — Progress Notes (Signed)
Transition Care Management Unsuccessful Follow-up Telephone Call  Date of discharge and from where:  [facility]  Attempts:  1st Attempt  Reason for unsuccessful TCM follow-up call:  No answer/busy      Lenard Forth [facility] Guide, Phone: [phone] Fax: [phone] Website: [email or web address]

## 2023-10-30 DIAGNOSIS — Z87898 Personal history of other specified conditions: Secondary | ICD-10-CM | POA: Insufficient documentation

## 2023-10-30 NOTE — Progress Notes (Unsigned)
 Name: Mason [name] DOB: [birth year] MRN: [id number]  History of Present Illness: Mason [name] is a 78 y.o. male who presents today for follow up visit at [facility]. He is accompanied by Neil Crouch (CNA) from [facility] where he resides. - GU history:  1. BPH with BOO & LUTS (intermittent stream, hesitancy, urgency, nocturia). - 10/09/2022: CT abdomen/pelvis w/ contrast showed "Enlarged prostate measuring 5.4 x 4.4 x 5.6 cm".  - Taking Flomax 0.4 mg daily. 2. Elevated PSA. - No known family history of prostate cancer. - No prior prostate biopsy. 3. Prior distant episode of prostatitis. 4. Erectile dysfunction. Not significantly bothersome (not sexually active).   PSA values: - 04/2011: 2.75 - 04/2013: 3.14 - 04/2014: 4.38 - 08/2014: 3.75 - 03/02/2023: 4.67  At last visit on Aug 20, 2023: - Had passed voiding trial at SNF 10 days prior.  - PVR in office = 249 ml (voided a few hours prior). - The plan was:  1. RUS within 1-2 weeks to screen for hydronephrosis s/p catheter removal.  2. Continue Flomax 0.4 mg daily for BPH.  3. Follow up in 3 months.    Since last visit: > 08/04/2023: RUS was normal - no stones, masses, or hydronephrosis.   Today: He reports some urinary urgency and nocturia. Denies dysuria, gross hematuria, incontinence, weak urinary stream, hesitancy, straining to void, or sensations of incomplete emptying.   He is taking Miralax for constipation; reports having bowel movements every 1-2 days on average.   Medications: Current Outpatient Medications  Medication Sig Dispense Refill   amLODipine (NORVASC) 10 MG tablet Take 1 tablet (10 mg total) by mouth daily.     apixaban (ELIQUIS) 5 MG TABS tablet Take 1 tablet (5 mg total) by mouth 2 (two) times daily. 60 tablet 1   b complex vitamins capsule Take 1 capsule by mouth daily.     bimatoprost (LUMIGAN) 0.01 % SOLN Place 1 drop into both eyes at bedtime.     Cholecalciferol  (VITAMIN D) 125 MCG (5000 UT) CAPS Take 1 tablet by mouth daily.     esomeprazole (NEXIUM) 40 MG capsule Take 1 capsule by mouth twice daily 60 capsule 3   fluticasone (FLONASE) 50 MCG/ACT nasal spray Place 1 spray into both nostrils daily.     losartan (COZAAR) 25 MG tablet Take 1 tablet (25 mg total) by mouth daily.     LYRICA 25 MG capsule Take 25 mg by mouth 2 (two) times daily.     melatonin 3 MG TABS tablet Take 1 tablet (3 mg total) by mouth at bedtime as needed.     polyethylene glycol (MIRALAX / GLYCOLAX) 17 g packet Take 17 g by mouth 2 (two) times daily.     senna-docusate (SENOKOT-S) 8.6-50 MG tablet Take 1 tablet by mouth at bedtime as needed for mild constipation.     tamsulosin (FLOMAX) 0.4 MG CAPS capsule Take 1 capsule (0.4 mg total) by mouth daily after supper.     timolol (BETIMOL) 0.25 % ophthalmic solution Apply to eye.     timolol (TIMOPTIC-XR) 0.5 % ophthalmic gel-forming Place 1 drop into both eyes every morning.     traMADol (ULTRAM) 50 MG tablet Take 50 mg by mouth 3 (three) times daily.     TRAZODONE HCL PO Take 0.5 mg by mouth at bedtime.     vitamin C (ASCORBIC ACID) 500 MG tablet Take 500 mg by mouth daily.     zinc gluconate 50 MG tablet Take  50 mg by mouth daily.     No current facility-administered medications for this visit.    Allergies: Allergies  Allergen Reactions   Shellfish Allergy Shortness Of Breath, Rash and Other (See Comments)    GI upset Difficulty breathing  Reaction to shrimp and crab   Ak-Mycin [Erythromycin] Other (See Comments)    GI upset   Celexa [Citalopram] Other (See Comments)    Aggression    Levsin [Hyoscyamine] Other (See Comments)    Urinary retention   Librax [Chlordiazepoxide-Clidinium] Other (See Comments)    Urinary retention   Mobic [Meloxicam] Other (See Comments)    GI upset    Past Medical History:  Diagnosis Date   Actinic keratosis 04/02/2013   L sup scapula - bx proven   Actinic keratosis 04/02/2013   R  med ant deltoid - bx proven   Allergy    Anal fissure    Anxiety    Basal cell carcinoma    back - treated in the past    Basal cell carcinoma 05/02/2009   left supratip, Mohs   Cancer (HCC)    basal cell removed x4   Cataract    bil eyes   Depression    Diverticulosis of colon    GERD (gastroesophageal reflux disease)    esophageal narrowing   Glaucoma    Hiatal hernia    IBS (irritable bowel syndrome)    Insomnia    Internal hemorrhoids    Past Surgical History:  Procedure Laterality Date   ANAL SPHINCTEROTOMY  03/1995   fistulotomy   APPENDECTOMY     BASAL CELL CARCINOMA EXCISION     on back   BICEPT TENODESIS Right 09/27/2004   decompression   COLONOSCOPY  04/1999   internal hemmroids,12-11-08 colon tics and hems only    ESOPHAGOGASTRODUODENOSCOPY  04/1999   with esophagitis and stricture   MOHS SURGERY  09/02/2008   L supratip of nose with flap,basal cell cancer [facility]/MOHS   MOUTH SURGERY  01/2020   TONGUE SURGERY  2017   had small place removed, noncancerous   TONSILLECTOMY AND ADENOIDECTOMY     remote   UPPER GASTROINTESTINAL ENDOSCOPY     WRIST SURGERY Right    WRIST SURGERY     scar tissue removed   Family History  Problem Relation Age of Onset   Hypertension Mother    Stroke Mother    Pneumonia Father    Bladder Cancer Father    Cancer Maternal Aunt        ? type   Breast cancer Maternal Aunt        mets   Esophageal cancer Maternal Aunt        aunt ? esophageal ca   Stomach cancer Maternal Aunt        aunt ? stmach ca   Colon cancer Maternal Aunt        had colostomy - not sure of origin   Cancer Maternal Grandmother        ? type   Prostate cancer Neg Hx    Rectal cancer Neg Hx    Social History   Socioeconomic History   Marital status: Divorced    Spouse name: Not on file   Number of children: 1   Years of education: Not on file   Highest education level: Not on file  Occupational History   Occupation: Art therapist, retired  1997    Employer: [employer]  Tobacco Use   Smoking status: Former  Current packs/day: 0.00    Types: Cigarettes, Cigars    Quit date: 08/22/1998    Years since quitting: 25.2   Smokeless tobacco: Never   Tobacco comments:    cigarettes 1968, cigars 2000  Vaping Use   Vaping status: Never Used  Substance and Sexual Activity   Alcohol use: Not Currently    Alcohol/week: 0.0 standard drinks of alcohol    Comment: rare   Drug use: No   Sexual activity: Never  Other Topics Concern   Not on file  Social History Narrative   Retired from [employer]   From [place]   Prev divorced    1 son   [place] '63-'67, no known agent orange exposure      Pt stays at [facility]   Financial Resource Strain: Low Risk  (07/20/2022)   Overall Financial Resource Strain (CARDIA)    Difficulty of Paying Living Expenses: Not hard at all  Food Insecurity: No Food Insecurity (05/02/2023)   Hunger Vital Sign    Worried About Running Out of Food in the Last Year: Never true    Ran Out of Food in the Last Year: Never true  Transportation Needs: No Transportation Needs (05/02/2023)   PRAPARE - Administrator, Civil Service (Medical): No    Lack of Transportation (Non-Medical): No  Physical Activity: Insufficiently Active (07/20/2022)   Exercise Vital Sign    Days of Exercise per Week: 3 days    Minutes of Exercise per Session: 30 min  Stress: No Stress Concern Present (07/20/2022)   [employer] of Occupational Health - Occupational Stress Questionnaire    Feeling of Stress : Not at all  Social Connections: Socially Isolated (07/20/2022)   Social Connection and Isolation Panel [NHANES]    Frequency of Communication with Friends and Family: More than three times a week    Frequency of Social Gatherings with Friends and Family: Twice a week    Attends Religious Services: Never    Database administrator or Organizations: No    Attends Banker  Meetings: Never    Marital Status: Divorced  Catering manager Violence: Not At Risk (05/02/2023)   Humiliation, Afraid, Rape, and Kick questionnaire    Fear of Current or Ex-Partner: No    Emotionally Abused: No    Physically Abused: No    Sexually Abused: No    Review of Systems Constitutional: Patient denies any unintentional weight loss or change in strength lntegumentary: Patient denies any rashes or pruritus Cardiovascular: Patient denies chest pain or syncope Respiratory: Patient denies shortness of breath Gastrointestinal: As per HPI Musculoskeletal: Patient denies muscle cramps or weakness Neurologic: Patient denies convulsions or seizures Allergic/Immunologic: Patient denies recent allergic reaction(s) Hematologic/Lymphatic: Patient denies bleeding tendencies Endocrine: Patient denies heat/cold intolerance  GU: As per HPI.  OBJECTIVE Vitals:   10/31/23 1112  BP: 119/78  Pulse: 73  Temp: 98.4 F (36.9 C)   There is no height or weight on file to calculate BMI.  Physical Examination Constitutional: No obvious distress; patient is non-toxic appearing  Cardiovascular: No visible lower extremity edema.  Respiratory: The patient does not have audible wheezing/stridor; respirations do not appear labored  Gastrointestinal: Abdomen non-distended Musculoskeletal: Normal ROM of UEs  Skin: No obvious rashes/open sores  Neurologic: CN 2-12 grossly intact Psychiatric: Answered questions appropriately with normal affect  Hematologic/Lymphatic/Immunologic: No obvious bruises or sites of spontaneous bleeding  UA: Patient did not void in office today  PVR: 99 ml  ASSESSMENT Benign prostatic hyperplasia, unspecified whether lower urinary tract symptoms present - Plan: Urinalysis, Routine w reflex microscopic, BLADDER SCAN AMB NON-IMAGING, PSA  History of elevated PSA - Plan: Urinalysis, Routine w reflex microscopic, BLADDER SCAN AMB NON-IMAGING, PSA  History of hemorrhagic  stroke with residual hemiplegia (HCC) - Plan: Urinalysis, Routine w reflex microscopic, BLADDER SCAN AMB NON-IMAGING  Ambulatory dysfunction - Plan: Urinalysis, Routine w reflex microscopic, BLADDER SCAN AMB NON-IMAGING  Weakness of right side of body - Plan: Urinalysis, Routine w reflex microscopic, BLADDER SCAN AMB NON-IMAGING  Former smoker - Plan: Urinalysis, Routine w reflex microscopic, BLADDER SCAN AMB NON-IMAGING  History of urinary retention - Plan: Urinalysis, Routine w reflex microscopic, BLADDER SCAN AMB NON-IMAGING  He is doing well today with no evidence of urinary retention. We agreed to continue Flomax (Tamsulosin) 0.4 mg daily for BPH and he was advised to continue constipation management / prevention.   We reviewed his prior history of mildly elevated PSA. Age norms and PSA velocity ("trend") were discussed. Findings and implications of elevated PSA discussed with patient. Agreed to recheck PSA today. If trend remains stable, will likely forgo future PSA checks based on low risk for development of prostate cancer given his age in accordance with AUA guidelines.   We agreed to plan for follow up in 6 months or sooner if needed. Patient verbalized understanding of and agreement with current plan. All questions were answered.  PLAN Advised the following: 1. PSA. 2. Continue Flomax (Tamsulosin) 0.4 mg daily. 3. Return in about 6 months (around 05/02/2024) for UA, PVR, & f/u with Evette Georges NP.  Orders Placed This Encounter  Procedures   Urinalysis, Routine w reflex microscopic   PSA   BLADDER SCAN AMB NON-IMAGING   Total time spent caring for the patient today was over 30 minutes. This includes time spent on the date of the visit reviewing the patient's chart before the visit, time spent during the visit, and time spent after the visit on documentation. Over 50% of that time was spent in face-to-face time with this patient for direct counseling. E&M based on time and  complexity of medical decision making.  It has been explained that the patient is to follow regularly with their PCP in addition to all other providers involved in their care and to follow instructions provided by these respective offices. Patient advised to contact urology clinic if any urologic-pertaining questions, concerns, new symptoms or problems arise in the interim period.  There are no Patient Instructions on file for this visit.  Electronically signed by:  Donnita Falls, FNP   10/31/23    12:06 PM

## 2023-10-31 ENCOUNTER — Ambulatory Visit: Payer: Medicare Other | Admitting: Urology

## 2023-10-31 ENCOUNTER — Encounter: Payer: Self-pay | Admitting: Urology

## 2023-10-31 VITALS — BP 119/78 | HR 73 | Temp 98.4°F

## 2023-10-31 DIAGNOSIS — R531 Weakness: Secondary | ICD-10-CM

## 2023-10-31 DIAGNOSIS — N400 Enlarged prostate without lower urinary tract symptoms: Secondary | ICD-10-CM | POA: Diagnosis not present

## 2023-10-31 DIAGNOSIS — R262 Difficulty in walking, not elsewhere classified: Secondary | ICD-10-CM

## 2023-10-31 DIAGNOSIS — I69359 Hemiplegia and hemiparesis following cerebral infarction affecting unspecified side: Secondary | ICD-10-CM

## 2023-10-31 DIAGNOSIS — Z87898 Personal history of other specified conditions: Secondary | ICD-10-CM | POA: Diagnosis not present

## 2023-10-31 DIAGNOSIS — Z87891 Personal history of nicotine dependence: Secondary | ICD-10-CM | POA: Diagnosis not present

## 2023-10-31 LAB — BLADDER SCAN AMB NON-IMAGING: Scan Result: 99

## 2023-11-01 ENCOUNTER — Telehealth: Payer: Self-pay

## 2023-11-01 LAB — PSA: Prostate Specific Ag, Serum: 3.1 ng/mL (ref 0.0–4.0)

## 2023-11-01 NOTE — Telephone Encounter (Signed)
-----   Message from Donnita Falls sent at 11/01/2023  8:48 AM EDT ----- Please let pt know his PSA is normal. No need to recheck in the future. Follow up as planned. Thanks.

## 2023-11-01 NOTE — Telephone Encounter (Signed)
 Called faculty to make nurse aware of patient's PSA, with no answer

## 2023-11-03 NOTE — Telephone Encounter (Signed)
 Called faculty to make nurse aware of patient's PSA, with no answer

## 2023-11-07 NOTE — Telephone Encounter (Signed)
 Tried calling patient/nurse at faculty  several time with no answer. Letter mailed out

## 2024-01-10 ENCOUNTER — Encounter: Payer: Self-pay | Admitting: Dermatology

## 2024-01-10 ENCOUNTER — Ambulatory Visit (INDEPENDENT_AMBULATORY_CARE_PROVIDER_SITE_OTHER): Payer: Medicare Other | Admitting: Dermatology

## 2024-01-10 DIAGNOSIS — L814 Other melanin hyperpigmentation: Secondary | ICD-10-CM | POA: Diagnosis not present

## 2024-01-10 DIAGNOSIS — L570 Actinic keratosis: Secondary | ICD-10-CM

## 2024-01-10 DIAGNOSIS — L820 Inflamed seborrheic keratosis: Secondary | ICD-10-CM

## 2024-01-10 DIAGNOSIS — W908XXA Exposure to other nonionizing radiation, initial encounter: Secondary | ICD-10-CM

## 2024-01-10 DIAGNOSIS — Z872 Personal history of diseases of the skin and subcutaneous tissue: Secondary | ICD-10-CM

## 2024-01-10 DIAGNOSIS — L578 Other skin changes due to chronic exposure to nonionizing radiation: Secondary | ICD-10-CM

## 2024-01-10 DIAGNOSIS — L821 Other seborrheic keratosis: Secondary | ICD-10-CM

## 2024-01-10 DIAGNOSIS — Z85828 Personal history of other malignant neoplasm of skin: Secondary | ICD-10-CM

## 2024-01-10 DIAGNOSIS — Z8673 Personal history of transient ischemic attack (TIA), and cerebral infarction without residual deficits: Secondary | ICD-10-CM

## 2024-01-10 DIAGNOSIS — Z1283 Encounter for screening for malignant neoplasm of skin: Secondary | ICD-10-CM

## 2024-01-10 DIAGNOSIS — D1801 Hemangioma of skin and subcutaneous tissue: Secondary | ICD-10-CM

## 2024-01-10 DIAGNOSIS — L719 Rosacea, unspecified: Secondary | ICD-10-CM

## 2024-01-10 DIAGNOSIS — D692 Other nonthrombocytopenic purpura: Secondary | ICD-10-CM

## 2024-01-10 DIAGNOSIS — D229 Melanocytic nevi, unspecified: Secondary | ICD-10-CM

## 2024-01-10 NOTE — Progress Notes (Signed)
 Follow-Up Visit   Subjective  Mason [name] is a 78 y.o. male who presents for the following: Skin Cancer Screening and Full Body Skin Exam Spot at neck noticed 7 or 8 months ago Hx of bcc, hx of aks and isk, hx of wart at left hand   Patient suffered stroke in January, currently in rehab center. Here today with caregiver  The patient presents for Total-Body Skin Exam (TBSE) for skin cancer screening and mole check. The patient has spots, moles and lesions to be evaluated, some may be new or changing and the patient may have concern these could be cancer.  The following portions of the chart were reviewed this encounter and updated as appropriate: medications, allergies, medical history  Review of Systems:  No other skin or systemic complaints except as noted in HPI or Assessment and Plan.  Objective  Well appearing patient in no apparent distress; mood and affect are within normal limits.  A full examination was performed including scalp, head, eyes, ears, nose, lips, neck, chest, axillae, abdomen, back, buttocks, bilateral upper extremities, bilateral lower extremities, hands, feet, fingers, toes, fingernails, and toenails. All findings within normal limits unless otherwise noted below.   Relevant physical exam findings are noted in the Assessment and Plan.  left forearm x 2 (2) Erythematous thin papules/macules with gritty scale.  right abdomen x 1, chest and neck x 6, back x 12 (19) Erythematous stuck-on, waxy papule or plaque  Assessment & Plan   SKIN CANCER SCREENING PERFORMED TODAY.  ACTINIC DAMAGE - Chronic condition, secondary to cumulative UV/sun exposure - diffuse scaly erythematous macules with underlying dyspigmentation - Recommend daily broad spectrum sunscreen SPF 30+ to sun-exposed areas, reapply every 2 hours as needed.  - Staying in the shade or wearing long sleeves, sun glasses (UVA+UVB protection) and wide brim hats (4-inch brim around the entire  circumference of the hat) are also recommended for sun protection.  - Call for new or changing lesions.  LENTIGINES, SEBORRHEIC KERATOSES, HEMANGIOMAS - Benign normal skin lesions - Benign-appearing - Call for any changes  MELANOCYTIC NEVI - Tan-brown and/or pink-flesh-colored symmetric macules and papules - Benign appearing on exam today - Observation - Call clinic for new or changing moles - Recommend daily use of broad spectrum spf 30+ sunscreen to sun-exposed areas.   Purpura - Chronic; persistent and recurrent.  Treatable, but not curable. - Violaceous macules and patches - Benign - Related to trauma, age, sun damage and/or use of blood thinners, chronic use of topical and/or oral steroids - Observe - Can use OTC arnica containing moisturizer such as Dermend Bruise Formula if desired - Call for worsening or other concerns  HISTORY OF BASAL CELL CARCINOMA OF THE SKIN Left nose supratip - Mohs - 05/02/2009 - No evidence of recurrence today - Recommend regular full body skin exams - Recommend daily broad spectrum sunscreen SPF 30+ to sun-exposed areas, reapply every 2 hours as needed.  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 Condition is symptomatic / bothersome to patient. Not to goal. Rosacea is a chronic progressive skin condition usually affecting the face of adults, causing redness and/or acne bumps. It is treatable but not curable. It sometimes affects the eyes (ocular rosacea) as well. It may respond to topical and/or systemic medication and can flare with stress, sun exposure, alcohol, exercise, topical  steroids (including hydrocortisone/cortisone 10) and some foods.  Daily application of broad spectrum spf 30+ sunscreen to face is recommended to reduce flares.  Patient denies grittiness of the eyes    Treatment Plan Mild - no recommended treatment    ACTINIC KERATOSIS (2) left forearm x 2 (2) Actinic keratoses are precancerous spots that appear secondary to cumulative UV radiation exposure/sun exposure over time. They are chronic with expected duration over 1 year. A portion of actinic keratoses will progress to squamous cell carcinoma of the skin. It is not possible to reliably predict which spots will progress to skin cancer and so treatment is recommended to prevent development of skin cancer.  Recommend daily broad spectrum sunscreen SPF 30+ to sun-exposed areas, reapply every 2 hours as needed.  Recommend staying in the shade or wearing long sleeves, sun glasses (UVA+UVB protection) and wide brim hats (4-inch brim around the entire circumference of the hat). Call for new or changing lesions. Destruction of lesion - left forearm x 2 (2) INFLAMED SEBORRHEIC KERATOSIS (19) right abdomen x 1, chest and neck x 6, back x 12 (19) Symptomatic, irritating, patient would like treated. Destruction of lesion - right abdomen x 1, chest and neck x 6, back x 12 (19) Complexity: simple   Destruction method: cryotherapy   Informed consent: discussed and consent obtained   Timeout:  patient name, date of birth, surgical site, and procedure verified Lesion destroyed using liquid nitrogen: Yes   Region frozen until ice ball extended beyond lesion: Yes   Outcome: patient tolerated procedure well with no complications   Post-procedure details: wound care instructions given   SKIN CANCER SCREENING   ACTINIC SKIN DAMAGE   LENTIGO   MELANOCYTIC NEVUS, UNSPECIFIED LOCATION   SEBORRHEIC KERATOSIS   PURPURA (HCC)   HISTORY OF BASAL CELL CARCINOMA   ROSACEA   HISTORY OF RECENT STROKE   Return in about 1 year (around 01/09/2025) for TBSE.  IRandee Busing, CMA, am acting as scribe for Celine Collard, MD.   Documentation: I have reviewed the above documentation for accuracy and  completeness, and I agree with the above.  Celine Collard, MD

## 2024-01-10 NOTE — Patient Instructions (Addendum)
Seborrheic Keratosis  What causes seborrheic keratoses? Seborrheic keratoses are harmless, common skin growths that first appear during adult life.  As time goes by, more growths appear.  Some people may develop a large number of them.  Seborrheic keratoses appear on both covered and uncovered body parts.  They are not caused by sunlight.  The tendency to develop seborrheic keratoses can be inherited.  They vary in color from skin-colored to gray, brown, or even black.  They can be either smooth or have a rough, warty surface.   Seborrheic keratoses are superficial and look as if they were stuck on the skin.  Under the microscope this type of keratosis looks like layers upon layers of skin.  That is why at times the top layer may seem to fall off, but the rest of the growth remains and re-grows.    Treatment Seborrheic keratoses do not need to be treated, but can easily be removed in the office.  Seborrheic keratoses often cause symptoms when they rub on clothing or jewelry.  Lesions can be in the way of shaving.  If they become inflamed, they can cause itching, soreness, or burning.  Removal of a seborrheic keratosis can be accomplished by freezing, burning, or surgery. If any spot bleeds, scabs, or grows rapidly, please return to have it checked, as these can be an indication of a skin cancer.  Actinic keratoses are precancerous spots that appear secondary to cumulative UV radiation exposure/sun exposure over time. They are chronic with expected duration over 1 year. A portion of actinic keratoses will progress to squamous cell carcinoma of the skin. It is not possible to reliably predict which spots will progress to skin cancer and so treatment is recommended to prevent development of skin cancer.  Recommend daily broad spectrum sunscreen SPF 30+ to sun-exposed areas, reapply every 2 hours as needed.  Recommend staying in the shade or wearing long sleeves, sun glasses (UVA+UVB protection) and  wide brim hats (4-inch brim around the entire circumference of the hat). Call for new or changing lesions.   Cryotherapy Aftercare  Wash gently with soap and water everyday.   Apply Vaseline and Band-Aid daily until healed.    Melanoma ABCDEs  Melanoma is the most dangerous type of skin cancer, and is the leading cause of death from skin disease.  You are more likely to develop melanoma if you: Have light-colored skin, light-colored eyes, or red or blond hair Spend a lot of time in the sun Tan regularly, either outdoors or in a tanning bed Have had blistering sunburns, especially during childhood Have a close family member who has had a melanoma Have atypical moles or large birthmarks  Early detection of melanoma is key since treatment is typically straightforward and cure rates are extremely high if we catch it early.   The first sign of melanoma is often a change in a mole or a new dark spot.  The ABCDE system is a way of remembering the signs of melanoma.  A for asymmetry:  The two halves do not match. B for border:  The edges of the growth are irregular. C for color:  A mixture of colors are present instead of an even brown color. D for diameter:  Melanomas are usually (but not always) greater than 6mm - the size of a pencil eraser. E for evolution:  The spot keeps changing in size, shape, and color.  Please check your skin once per month between visits. You can  use a small mirror in front and a large mirror behind you to keep an eye on the back side or your body.   If you see any new or changing lesions before your next follow-up, please call to schedule a visit.  Please continue daily skin protection including broad spectrum sunscreen SPF 30+ to sun-exposed areas, reapplying every 2 hours as needed when you're outdoors.   Staying in the shade or wearing long sleeves, sun glasses (UVA+UVB protection) and wide brim hats (4-inch brim around the entire circumference of the hat)  are also recommended for sun protection.    Due to recent changes in healthcare laws, you may see results of your pathology and/or laboratory studies on MyChart before the doctors have had a chance to review them. We understand that in some cases there may be results that are confusing or concerning to you. Please understand that not all results are received at the same time and often the doctors may need to interpret multiple results in order to provide you with the best plan of care or course of treatment. Therefore, we ask that you please give [place] 2 business days to thoroughly review all your results before contacting the office for clarification. Should we see a critical lab result, you will be contacted sooner.   If You Need Anything After Your Visit  If you have any questions or concerns for your doctor, please call our main line at [phone] and press option 4 to reach your doctor's medical assistant. If no one answers, please leave a voicemail as directed and we will return your call as soon as possible. Messages left after 4 pm will be answered the following business day.   You may also send [place] a message via MyChart. We typically respond to MyChart messages within 1-2 business days.  For prescription refills, please ask your pharmacy to contact our office. Our fax number is [phone].  If you have an urgent issue when the clinic is closed that cannot wait until the next business day, you can page your doctor at the number below.    Please note that while we do our best to be available for urgent issues outside of office hours, we are not available 24/7.   If you have an urgent issue and are unable to reach [place], you may choose to seek medical care at your doctor's office, retail clinic, urgent care center, or emergency room.  If you have a medical emergency, please immediately call 911 or go to the emergency department.  Pager Numbers  - Dr. Gwen Pounds: [phone]  - Dr. Roseanne Reno:  [phone]  - Dr. Katrinka Blazing: [phone]   In the event of inclement weather, please call our main line at [phone] for an update on the status of any delays or closures.  Dermatology Medication Tips: Please keep the boxes that topical medications come in in order to help keep track of the instructions about where and how to use these. Pharmacies typically print the medication instructions only on the boxes and not directly on the medication tubes.   If your medication is too expensive, please contact our office at [phone] option 4 or send [place] a message through MyChart.   We are unable to tell what your co-pay for medications will be in advance as this is different depending on your insurance coverage. However, we may be able to find a substitute medication at lower cost or fill out paperwork to get insurance to cover a needed medication.  If a prior authorization is required to get your medication covered by your insurance company, please allow [place] 1-2 business days to complete this process.  Drug prices often vary depending on where the prescription is filled and some pharmacies may offer cheaper prices.  The website www.goodrx.com contains coupons for medications through different pharmacies. The prices here do not account for what the cost may be with help from insurance (it may be cheaper with your insurance), but the website can give you the price if you did not use any insurance.  - You can print the associated coupon and take it with your prescription to the pharmacy.  - You may also stop by our office during regular business hours and pick up a GoodRx coupon card.  - If you need your prescription sent electronically to a different pharmacy, notify our office through [facility] or by phone at [phone] option 4.     Si Usted Necesita Algo Despus de Su Visita  Tambin puede enviarnos un mensaje a travs de Clinical cytogeneticist. Por lo general respondemos a los mensajes de  MyChart en el transcurso de 1 a 2 das hbiles.  Para renovar recetas, por favor pida a su farmacia que se ponga en contacto con nuestra oficina. Annie Sable de fax es [place] [phone].  Si tiene un asunto urgente cuando la clnica est cerrada y que no puede esperar hasta el siguiente da hbil, puede llamar/localizar a su doctor(a) al nmero que aparece a continuacin.   Por favor, tenga en cuenta que aunque hacemos todo lo posible para estar disponibles para asuntos urgentes fuera del horario de [place], no estamos disponibles las 24 horas del da, los 7 [address]  [address] de la [place].   Si tiene un problema urgente y no puede comunicarse con nosotros, puede optar por buscar atencin mdica  en el consultorio de su doctor(a), en una clnica privada, en un centro de atencin urgente o en una sala de emergencias.  Si tiene Engineer, drilling, por favor llame inmediatamente al 911 o vaya a la sala de emergencias.  Nmeros de bper  - Dr. Gwen Pounds: [phone]  - Dra. Roseanne Reno: [phone]  - Dr. Katrinka Blazing: [phone]   En caso de inclemencias del tiempo, por favor llame a Lacy Duverney principal al [phone] para una actualizacin sobre el [place] de cualquier retraso o cierre.  Consejos para la medicacin en dermatologa: Por favor, guarde las cajas en las que vienen los medicamentos de uso tpico para ayudarle a seguir las instrucciones sobre dnde y cmo usarlos. Las farmacias generalmente imprimen las instrucciones del medicamento slo en las cajas y no directamente en los tubos del [place].   Si su medicamento es muy caro, por favor, pngase en contacto con Rolm Gala llamando al [phone] y presione la opcin 4 o envenos un mensaje a travs de Clinical cytogeneticist.   No podemos decirle cul ser su copago por los medicamentos por adelantado ya que esto es diferente dependiendo de la cobertura de su seguro. Sin embargo, es posible que podamos encontrar un medicamento sustituto a Advice worker un formulario para que el seguro cubra el medicamento que se considera necesario.   Si se requiere una autorizacin previa para que su compaa de seguros [place] su medicamento, por favor permtanos de 1 a 2 das hbiles para completar [address]  Los precios de los medicamentos varan con frecuencia dependiendo del Environmental consultant de dnde se surte la receta y alguna farmacias pueden ofrecer precios ms baratos.  El sitio web  www.goodrx.com tiene cupones para medicamentos de Health and safety inspector. Los precios aqu no tienen en cuenta lo que podra costar con la ayuda del seguro (puede ser ms barato con su seguro), pero el sitio web puede darle el precio si no utiliz Tourist information centre manager.  - Puede imprimir el cupn correspondiente y llevarlo con su receta a la farmacia.  - Tambin puede pasar por nuestra oficina durante el horario de atencin regular y Education officer, museum una tarjeta de cupones de GoodRx.  - Si necesita que su receta se enve electrnicamente a una farmacia diferente, informe a nuestra oficina a travs de MyChart de [place] o por telfono llamando al [phone] y presione la opcin 4.

## 2024-02-06 ENCOUNTER — Telehealth: Payer: Self-pay | Admitting: Adult Health

## 2024-02-06 NOTE — Telephone Encounter (Signed)
 MYC conf

## 2024-02-12 ENCOUNTER — Ambulatory Visit (INDEPENDENT_AMBULATORY_CARE_PROVIDER_SITE_OTHER): Payer: Medicare Other | Admitting: Adult Health

## 2024-02-12 ENCOUNTER — Encounter: Payer: Self-pay | Admitting: Adult Health

## 2024-02-12 VITALS — BP 139/86 | HR 78 | Ht 69.0 in | Wt 165.0 lb

## 2024-02-12 DIAGNOSIS — I610 Nontraumatic intracerebral hemorrhage in hemisphere, subcortical: Secondary | ICD-10-CM | POA: Diagnosis not present

## 2024-02-12 DIAGNOSIS — G8110 Spastic hemiplegia affecting unspecified side: Secondary | ICD-10-CM | POA: Diagnosis not present

## 2024-02-12 NOTE — Progress Notes (Signed)
 [facility] [address] [place]. [place] [address] [phone]       OFFICE FOLLOW-UP NOTE  [name] Date of Birth:  1945/11/01 Medical Record Number:  [id number]    Primary neurologist: Dr. Rosemarie Reason for visit: Stroke follow-up  Chief Complaint  Patient presents with   Cerebrovascular Accident    RM 8 with facility staff  Pt is well and stable, reports he is having residual R sided immobility and R sided facial pain.  No other concerns       HPI:   Update 02/12/2024 JM: Patient returns for follow-up visit.  Continues to reside at [facility]. He is accompanied by facility staff. Reports continued right hemiparesis with sensory impairment. Recently restarted working with PT, reports more of a maintenance therapy. Remains nonambulatory, can stand/pivot for transfers.  Reports some improvement of RUE weakness, some improvement of sensory deficits. Does report right sided facial pain and occasional right shoulder pain, burning sensation, worse at night. Reports occasional spasms in RUE. He is currently on Lyrica 25mg  TID and Robaxin 500mg  TID. No new stroke/TIA symptoms. He is hopeful to be able to return back home at some point but continues to need assistance for ADLs. Per facility [facility], remains on Eliquis , denies side effects. Facility provider currently managing stroke risk factors, has not seen PCP Dr. Cleatus since his stroke. No further questions or concerns at this time.      Initial visit 07/31/2023 Dr. Rosemarie: [name] is a 78 year old Caucasian male seen today for initial office follow-up visit following hospital admission for intracerebral hemorrhage in September 2024.  Patient is accompanied by aide from his [facility] assisted living facility in [place] where he is currently staying.  History is obtained from them and review of electronic medical records and I personally reviewed pertinent available imaging films in PACS.  He has  past medical history of atrial fibrillation on Xarelto , anxiety, basal cell carcinoma, depression, gastroesophageal reflux disease and colon diverticulosis.  He presented on 04/25/2023 with right-sided weakness.  He was not sure if it taken his dose of Xarelto  at night or not.  CT scan of the head on admission showed a 2.6 x 1.6 x 1.3 cm estimated volume of 3 cubic cc acute parenchymal hemorrhage involving the left thalamus without any intraventricular extension and hydrocephalus.  Follow-up CT scan showed slight increase in the hemorrhage size but without significant clinical worsening.  He was admitted to the ICU and blood pressure was tightly controlled.  CT angiogram of the head and neck showed high-grade stenosis of mid right P2 segment but no aneurysm.  CT chest showed nonocclusive filling defects in 2 segmental right upper lobe pulmonary artery suspicious for acute pulmonary embolism.  MRI scan of the brain shows stable appearance of the 2 x 2.3 x 3 cm estimated volume 7 mL left thalamic capsular hemorrhage without intraventricular extension hydrocephalus.  2D echo showed ejection fraction of 60 to 65%.  LDL cholesterol 59 mg percent.  Hemoglobin A1c was 6.0.  Urine drug screen was negative.  Lower extremity venous duplex was negative for DVT.  Patient was on Xarelto  prior to admission which was stopped.  He was transferred to inpatient rehab.  Patient is currently at [facility] assisted living facility in [place].  He has still significant right hemiplegia and is barely able to even stand up with the help of the therapist..  He is getting physical occupational therapy.  He can speak and swallow very  well.  He has only trace movement in the right elbow and hand.   ROS:   14 system review of systems is positive for those listed in HPI and all other systems negative  PMH:  Past Medical History:  Diagnosis Date   Actinic keratosis 04/02/2013   L sup scapula - bx proven   Actinic keratosis 04/02/2013    R med ant deltoid - bx proven   Allergy    Anal fissure    Anxiety    Basal cell carcinoma    back - treated in the past    Basal cell carcinoma 05/02/2009   left supratip, Mohs   Cancer (HCC)    basal cell removed x4   Cataract    bil eyes   Depression    Diverticulosis of colon    GERD (gastroesophageal reflux disease)    esophageal narrowing   Glaucoma    Hiatal hernia    IBS (irritable bowel syndrome)    Insomnia    Internal hemorrhoids     Social History:  Social History   Socioeconomic History   Marital status: Divorced    Spouse name: Not on file   Number of children: 1   Years of education: Not on file   Highest education level: Not on file  Occupational History   Occupation: Art therapist, retired 1997    Employer: [employer]  Tobacco Use   Smoking status: Former    Current packs/day: 0.00    Types: Cigarettes, Cigars    Quit date: 08/22/1998    Years since quitting: 25.4   Smokeless tobacco: Never   Tobacco comments:    cigarettes 1968, cigars 2000  Vaping Use   Vaping status: Never Used  Substance and Sexual Activity   Alcohol use: Not Currently    Alcohol/week: 0.0 standard drinks of alcohol    Comment: rare   Drug use: No   Sexual activity: Never  Other Topics Concern   Not on file  Social History Narrative   Retired from [employer]   From [place]   Prev divorced    1 son   Gerarda '63-'67, no known agent orange exposure      Pt stays at [facility]   Financial Resource Strain: Low Risk  (07/20/2022)   Overall Financial Resource Strain (CARDIA)    Difficulty of Paying Living Expenses: Not hard at all  Food Insecurity: No Food Insecurity (05/02/2023)   Hunger Vital Sign    Worried About Running Out of Food in the Last Year: Never true    Ran Out of Food in the Last Year: Never true  Transportation Needs: No Transportation Needs (05/02/2023)   PRAPARE - Administrator, Civil Service (Medical):  No    Lack of Transportation (Non-Medical): No  Physical Activity: Insufficiently Active (07/20/2022)   Exercise Vital Sign    Days of Exercise per Week: 3 days    Minutes of Exercise per Session: 30 min  Stress: No Stress Concern Present (07/20/2022)   [employer] of Occupational Health - Occupational Stress Questionnaire    Feeling of Stress : Not at all  Social Connections: Socially Isolated (07/20/2022)   Social Connection and Isolation Panel    Frequency of Communication with Friends and Family: More than three times a week    Frequency of Social Gatherings with Friends and Family: Twice a week    Attends Religious Services: Never    Active Member  of Clubs or Organizations: No    Attends Banker Meetings: Never    Marital Status: Divorced  Catering manager Violence: Not At Risk (05/02/2023)   Humiliation, Afraid, Rape, and Kick questionnaire    Fear of Current or Ex-Partner: No    Emotionally Abused: No    Physically Abused: No    Sexually Abused: No    Medications:   Current Outpatient Medications on File Prior to Visit  Medication Sig Dispense Refill   amLODipine  (NORVASC ) 10 MG tablet Take 1 tablet (10 mg total) by mouth daily.     apixaban  (ELIQUIS ) 5 MG TABS tablet Take 1 tablet (5 mg total) by mouth 2 (two) times daily. 60 tablet 1   b complex vitamins capsule Take 1 capsule by mouth daily.     Cholecalciferol (VITAMIN D) 125 MCG (5000 UT) CAPS Take 1 tablet by mouth daily.     famotidine (PEPCID) 20 MG tablet Take 20 mg by mouth daily.     fluticasone  (FLONASE ) 50 MCG/ACT nasal spray Place 1 spray into both nostrils daily.     latanoprost  (XALATAN ) 0.005 % ophthalmic solution SMARTSIG:In Eye(s)     losartan  (COZAAR ) 25 MG tablet Take 1 tablet (25 mg total) by mouth daily.     LYRICA 25 MG capsule Take 25 mg by mouth 2 (two) times daily.     melatonin 3 MG TABS tablet Take 1 tablet (3 mg total) by mouth at bedtime as needed.     methocarbamol  (ROBAXIN) 500 MG tablet Take 500 mg by mouth 3 (three) times daily.     polyethylene glycol (MIRALAX  / GLYCOLAX ) 17 g packet Take 17 g by mouth 2 (two) times daily.     senna-docusate (SENOKOT-S) 8.6-50 MG tablet Take 1 tablet by mouth at bedtime as needed for mild constipation.     tamsulosin  (FLOMAX ) 0.4 MG CAPS capsule Take 1 capsule (0.4 mg total) by mouth daily after supper.     timolol  (BETIMOL ) 0.25 % ophthalmic solution Apply to eye.     traMADol  (ULTRAM ) 50 MG tablet Take 50 mg by mouth 3 (three) times daily.     TRAZODONE  HCL PO Take 0.5 mg by mouth at bedtime.     vitamin C (ASCORBIC ACID) 500 MG tablet Take 500 mg by mouth daily.     zinc gluconate 50 MG tablet Take 50 mg by mouth daily.     No current facility-administered medications on file prior to visit.    Allergies:   Allergies  Allergen Reactions   Shellfish Allergy Shortness Of Breath, Rash and Other (See Comments)    GI upset Difficulty breathing  Reaction to shrimp and crab   Ak-Mycin [Erythromycin] Other (See Comments)    GI upset   Celexa  [Citalopram ] Other (See Comments)    Aggression    Levsin [Hyoscyamine] Other (See Comments)    Urinary retention   Librax [Chlordiazepoxide-Clidinium] Other (See Comments)    Urinary retention   Mobic  [Meloxicam ] Other (See Comments)    GI upset    Physical Exam Today's Vitals   02/12/24 1103  BP: 139/86  Pulse: 78  Weight: 165 lb (74.8 kg)  Height: 5' 9 (1.753 m)   Body mass index is 24.37 kg/m.  General: Frail elderly Caucasian male, seated, in no evident distress Head: head normocephalic and atraumatic.  Neck: supple with no carotid or supraclavicular bruits Cardiovascular: regular rate and rhythm, no murmurs Musculoskeletal: no deformity Skin:  no rash/petichiae Vascular:  Normal pulses  all extremities  Neurologic Exam Mental Status: Awake and fully alert. Oriented to place and time. Recent and remote memory intact. Attention span, concentration and  fund of knowledge appropriate. Mood and affect appropriate.  Cranial Nerves: Pupils equal, briskly reactive to light. Extraocular movements full without nystagmus. Visual fields full to confrontation. Hearing intact. Facial sensation intact.  Mild right lower facial weakness.  Tongue, palate moves normally and symmetrically.  Motor: Adequate strength left upper and lower extremity. RUE: 2/5 distal, 1/5 proximal with decreased ROM RLE: 1/5 throughout Increased tone right upper and lower extremity Sensory.:  Diminished ouch ,pinprick .position and vibratory sensation on the right side..  Coordination: Rapid alternating movements normal on left side. Finger-to-nose and heel-to-shin performed accurately on left side. Gait and Station: Sitting in a wheelchair.  Unable to walk..  Reflexes: 2+ on the right. 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  He has significant residual spastic right hemiplegia with poor baseline functioning.     PLAN:  -Continue working with therapies at rehab, discussed typical recovery time - recommend consideration of adjusting pregabalin dose for right sided nerve type pain - defer to SNF - recommend consideration of switching muscle relaxant from Robaxin to baclofen or tizanidine  - defer to SNF - Continue Eliquis  5 mg twice daily with history of A-fib for secondary stroke prevention managed/rx'd by SNF - Continue close PCP follow-up for aggressive stroke risk factor management    No further recommendations from stroke standpoint and is closely being followed by SNF for stroke risk factor management. Can follow up as needed at this time which patient agrees with.      I personally spent a total of 35 minutes in the care of the patient today including preparing to see the patient, getting/reviewing separately  obtained history, performing a medically appropriate exam/evaluation, counseling and educating, and documenting clinical information in the EHR.  Harlene Bogaert, AGNP-BC  [facility] [address] [place], [place] [address]  Phone [phone] Fax [phone] Note: This document was prepared with digital dictation and possible smart phrase technology. Any transcriptional errors that result from this process are unintentional.

## 2024-02-12 NOTE — Patient Instructions (Addendum)
 Your Plan:  Consider increasing Lyrica dosage at night to help with painful symptoms   Consider switching Robaxin to baclofen or tizanidine  to further help with spasticity   Request therapy also works with right arm with stretching exercises to help with spasticity   Continue current stroke prevention medications and ensure routine monitoring of stroke risk factor management      No further recommendations at this time. Can follow up as needed at this time.       Thank you for coming to see us  at [facility]. I hope we have been able to provide you high quality care today.  You may receive a patient satisfaction survey over the next few weeks. We would appreciate your feedback and comments so that we may continue to improve ourselves and the health of our patients.

## 2024-04-04 ENCOUNTER — Other Ambulatory Visit: Payer: Self-pay

## 2024-04-04 ENCOUNTER — Emergency Department (HOSPITAL_COMMUNITY)
Admission: EM | Admit: 2024-04-04 | Discharge: 2024-04-05 | Disposition: A | Discharge status: Home / Self Care | Attending: Emergency Medicine | Admitting: Emergency Medicine

## 2024-04-04 ENCOUNTER — Encounter (HOSPITAL_COMMUNITY): Payer: Self-pay | Admitting: Emergency Medicine

## 2024-04-04 DIAGNOSIS — Z79899 Other long term (current) drug therapy: Secondary | ICD-10-CM | POA: Insufficient documentation

## 2024-04-04 DIAGNOSIS — R531 Weakness: Secondary | ICD-10-CM | POA: Insufficient documentation

## 2024-04-04 DIAGNOSIS — Z8673 Personal history of transient ischemic attack (TIA), and cerebral infarction without residual deficits: Secondary | ICD-10-CM | POA: Insufficient documentation

## 2024-04-04 DIAGNOSIS — Z7901 Long term (current) use of anticoagulants: Secondary | ICD-10-CM | POA: Insufficient documentation

## 2024-04-04 DIAGNOSIS — I480 Paroxysmal atrial fibrillation: Secondary | ICD-10-CM | POA: Diagnosis not present

## 2024-04-04 DIAGNOSIS — T8172XA Complication of vein following a procedure, not elsewhere classified, initial encounter: Secondary | ICD-10-CM | POA: Insufficient documentation

## 2024-04-04 DIAGNOSIS — Z87898 Personal history of other specified conditions: Secondary | ICD-10-CM

## 2024-04-04 NOTE — ED Provider Notes (Signed)
 [place] EMERGENCY DEPARTMENT AT [facility] Provider Note   CSN: [id number] Arrival date & time: 04/04/24  2020     Patient presents with: Bleeding/Bruising   Mason [name] is a 78 y.o. male.   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  He is unclear about the last time of his Eliquis  but thinks it was this morning.  He has had a very slow venous ooze from that tooth ever since it was extracted 5 hours ago.  No other symptoms    Prior to Admission medications   Medication Sig Start Date End Date Taking? Authorizing Provider  amLODipine  (NORVASC ) 10 MG tablet Take 1 tablet (10 mg total) by mouth daily. 05/05/23   Samtani, Jai-Gurmukh, MD  apixaban  (ELIQUIS ) 5 MG TABS tablet Take 1 tablet (5 mg total) by mouth 2 (two) times daily. 07/31/23   Sethi, Pramod S, MD  b complex vitamins capsule Take 1 capsule by mouth daily.    [provider]  Cholecalciferol (VITAMIN D) 125 MCG (5000 UT) CAPS Take 1 tablet by mouth daily.    [provider]  famotidine (PEPCID) 20 MG tablet Take 20 mg by mouth daily. 12/14/23   [provider]  fluticasone  (FLONASE ) 50 MCG/ACT nasal spray Place 1 spray into both nostrils daily. 05/05/23   Samtani, Jai-Gurmukh, MD  latanoprost  (XALATAN ) 0.005 % ophthalmic solution SMARTSIG:In Eye(s) 01/25/24   [provider]  losartan  (COZAAR ) 25 MG tablet Take 1 tablet (25 mg total) by mouth daily. 05/05/23   Samtani, Jai-Gurmukh, MD  LYRICA 25 MG capsule Take 25 mg by mouth 2 (two) times daily. 06/05/23   [provider]  melatonin 3 MG TABS tablet Take 1 tablet (3 mg total) by mouth at bedtime as needed. 05/05/23   Samtani, Jai-Gurmukh, MD  methocarbamol (ROBAXIN) 500 MG tablet Take 500  mg by mouth 3 (three) times daily. 12/14/23   [provider]  polyethylene glycol (MIRALAX  / GLYCOLAX ) 17 g packet Take 17 g by mouth 2 (two) times daily. 05/05/23   Samtani, Jai-Gurmukh, MD  senna-docusate (SENOKOT-S) 8.6-50 MG tablet Take 1 tablet by mouth at bedtime as needed for mild constipation. 05/05/23   Samtani, Jai-Gurmukh, MD  tamsulosin  (FLOMAX ) 0.4 MG CAPS capsule Take 1 capsule (0.4 mg total) by mouth daily after supper. 05/05/23   Samtani, Jai-Gurmukh, MD  timolol  (BETIMOL ) 0.25 % ophthalmic solution Apply to eye. 06/08/20   [provider]  traMADol  (ULTRAM ) 50 MG tablet Take 50 mg by mouth 3 (three) times daily. 07/17/23   [provider]  TRAZODONE  HCL PO Take 0.5 mg by mouth at bedtime.    [provider]  vitamin C (ASCORBIC ACID) 500 MG tablet Take 500 mg by mouth daily.    [provider]  zinc gluconate 50 MG tablet Take 50 mg by mouth daily.    [provider]    Allergies: Shellfish allergy, Ak-mycin [erythromycin], Celexa  [citalopram ], Levsin [hyoscyamine], Librax [chlordiazepoxide-clidinium], and Mobic  [meloxicam ]    Review of Systems  All other systems reviewed and are negative.   Updated Vital Signs BP 139/81   Pulse 73   Temp 99 F (37.2 C) (Axillary)   Resp 17   Ht 1.753 m (  5' 9)   Wt 74.4 kg   SpO2 91%   BMI 24.22 kg/m   Physical Exam Vitals and nursing note reviewed.  Constitutional:      General: He is not in acute distress.    Appearance: He is well-developed.  HENT:     Head: Normocephalic and atraumatic.     Mouth/Throat:     Pharynx: No oropharyngeal exudate.     Comments: Dentition intact, right lower molar that had been extracted has a very small amount of very slow venous bleeding in the surgical bed Eyes:     General: No scleral icterus.       Right eye: No discharge.        Left eye: No discharge.     Conjunctiva/sclera: Conjunctivae normal.     Pupils: Pupils are equal, round,  and reactive to light.  Neck:     Thyroid : No thyromegaly.     Vascular: No JVD.  Cardiovascular:     Rate and Rhythm: Normal rate and regular rhythm.     Heart sounds: Normal heart sounds. No murmur heard.    No friction rub. No gallop.  Pulmonary:     Effort: Pulmonary effort is normal. No respiratory distress.     Breath sounds: Normal breath sounds. No wheezing or rales.  Abdominal:     General: Bowel sounds are normal. There is no distension.     Palpations: Abdomen is soft. There is no mass.     Tenderness: There is no abdominal tenderness.  Musculoskeletal:        General: No tenderness. Normal range of motion.     Cervical back: Normal range of motion and neck supple.  Lymphadenopathy:     Cervical: No cervical adenopathy.  Skin:    General: Skin is warm and dry.     Findings: No erythema or rash.  Neurological:     Mental Status: He is alert.     Coordination: Coordination normal.     Comments: Right upper extremity weakness, speech is normal  Psychiatric:        Behavior: Behavior normal.     (all labs ordered are listed, but only abnormal results are displayed) Labs Reviewed - No data to display  EKG: None  Radiology: No results found.   Procedures   Medications Ordered in the ED - No data to display                                  Medical Decision Making  No distress, small amount of bleeding, will try Tea bag, does not appear to be in any distress, no blood work needed  The packing was removed as placed in the nursing facility.  I was able to place a teabag on the area where it was bleeding, the bleeding has improved but is still a very very slight ooze.  The teabag was replaced, the patient can go home stop the Eliquis  and this will likely stop over the next couple of days.  He is not at risk of heavy bleeding, he has had very little bleeding since arrival and his vital signs are normal       Final diagnoses:  No post-op complications      Cleotilde Rogue, MD 04/04/24 2310

## 2024-04-04 NOTE — ED Notes (Signed)
 ED Provider at bedside.

## 2024-04-04 NOTE — Discharge Instructions (Signed)
 Stop the Eliquis  immediately, your doctor can restart this once your bleeding stops  You will need to hold a teabag in your surgical location while you are awake and tonight for a couple more hours.  This bleeding is extremely slow and small  You can return to the ER as needed but the bleeding will likely take a couple of days to completely stop.

## 2024-04-04 NOTE — ED Triage Notes (Signed)
 Pt arrives via EMS from [facility] with c/o bleeding from mouth after teeth pulled at dental surgeon's office today at approx 1300. Pt takes eliquis , reports last dose this am.

## 2024-04-04 NOTE — ED Notes (Signed)
 New teabag placed on R lower bleeding teeth.

## 2024-04-04 NOTE — ED Notes (Signed)
 Tea bag and guaze applied to R lower jaw per MD order. Bleeding appears minimal at this time.

## 2024-04-05 MED ORDER — TRANEXAMIC ACID FOR EPISTAXIS
500.0000 mg | Freq: Once | TOPICAL | Status: AC
  Administered 2024-04-05: 500 mg via TOPICAL
  Filled 2024-04-05: qty 10

## 2024-04-05 NOTE — ED Notes (Signed)
 Pt states concern for continuation of bleeding, states he keeps swallowing blood. MD notified.

## 2024-04-26 ENCOUNTER — Ambulatory Visit: Admitting: Urology

## 2024-05-02 ENCOUNTER — Ambulatory Visit: Admitting: Urology

## 2024-05-12 ENCOUNTER — Emergency Department (HOSPITAL_COMMUNITY)

## 2024-05-12 ENCOUNTER — Encounter (HOSPITAL_COMMUNITY): Payer: Self-pay

## 2024-05-12 ENCOUNTER — Inpatient Hospital Stay (HOSPITAL_COMMUNITY)
Admission: EM | Admit: 2024-05-12 | Discharge: 2024-05-16 | DRG: 698 | Disposition: A | Discharge status: Skilled Nursing Facility | Source: Skilled Nursing Facility | Attending: Family Medicine | Admitting: Family Medicine

## 2024-05-12 ENCOUNTER — Other Ambulatory Visit: Payer: Self-pay

## 2024-05-12 DIAGNOSIS — Z9841 Cataract extraction status, right eye: Secondary | ICD-10-CM

## 2024-05-12 DIAGNOSIS — A419 Sepsis, unspecified organism: Principal | ICD-10-CM | POA: Diagnosis present

## 2024-05-12 DIAGNOSIS — N400 Enlarged prostate without lower urinary tract symptoms: Secondary | ICD-10-CM | POA: Diagnosis present

## 2024-05-12 DIAGNOSIS — Z8249 Family history of ischemic heart disease and other diseases of the circulatory system: Secondary | ICD-10-CM

## 2024-05-12 DIAGNOSIS — Z7901 Long term (current) use of anticoagulants: Secondary | ICD-10-CM

## 2024-05-12 DIAGNOSIS — A4159 Other Gram-negative sepsis: Secondary | ICD-10-CM | POA: Diagnosis present

## 2024-05-12 DIAGNOSIS — K219 Gastro-esophageal reflux disease without esophagitis: Secondary | ICD-10-CM | POA: Diagnosis present

## 2024-05-12 DIAGNOSIS — Z800 Family history of malignant neoplasm of digestive organs: Secondary | ICD-10-CM

## 2024-05-12 DIAGNOSIS — Z1624 Resistance to multiple antibiotics: Secondary | ICD-10-CM | POA: Diagnosis present

## 2024-05-12 DIAGNOSIS — Y846 Urinary catheterization as the cause of abnormal reaction of the patient, or of later complication, without mention of misadventure at the time of the procedure: Secondary | ICD-10-CM | POA: Diagnosis present

## 2024-05-12 DIAGNOSIS — K589 Irritable bowel syndrome without diarrhea: Secondary | ICD-10-CM | POA: Diagnosis present

## 2024-05-12 DIAGNOSIS — I10 Essential (primary) hypertension: Secondary | ICD-10-CM | POA: Diagnosis present

## 2024-05-12 DIAGNOSIS — Z9842 Cataract extraction status, left eye: Secondary | ICD-10-CM

## 2024-05-12 DIAGNOSIS — R339 Retention of urine, unspecified: Secondary | ICD-10-CM

## 2024-05-12 DIAGNOSIS — A4151 Sepsis due to Escherichia coli [E. coli]: Secondary | ICD-10-CM

## 2024-05-12 DIAGNOSIS — Z85828 Personal history of other malignant neoplasm of skin: Secondary | ICD-10-CM

## 2024-05-12 DIAGNOSIS — Z8052 Family history of malignant neoplasm of bladder: Secondary | ICD-10-CM

## 2024-05-12 DIAGNOSIS — F32A Depression, unspecified: Secondary | ICD-10-CM | POA: Diagnosis present

## 2024-05-12 DIAGNOSIS — R310 Gross hematuria: Secondary | ICD-10-CM | POA: Diagnosis present

## 2024-05-12 DIAGNOSIS — R6520 Severe sepsis without septic shock: Secondary | ICD-10-CM | POA: Diagnosis present

## 2024-05-12 DIAGNOSIS — R262 Difficulty in walking, not elsewhere classified: Secondary | ICD-10-CM | POA: Diagnosis present

## 2024-05-12 DIAGNOSIS — N390 Urinary tract infection, site not specified: Secondary | ICD-10-CM

## 2024-05-12 DIAGNOSIS — T83511A Infection and inflammatory reaction due to indwelling urethral catheter, initial encounter: Secondary | ICD-10-CM | POA: Diagnosis not present

## 2024-05-12 DIAGNOSIS — G9341 Metabolic encephalopathy: Principal | ICD-10-CM | POA: Diagnosis present

## 2024-05-12 DIAGNOSIS — Z66 Do not resuscitate: Secondary | ICD-10-CM | POA: Diagnosis present

## 2024-05-12 DIAGNOSIS — N12 Tubulo-interstitial nephritis, not specified as acute or chronic: Secondary | ICD-10-CM | POA: Diagnosis present

## 2024-05-12 DIAGNOSIS — I69359 Hemiplegia and hemiparesis following cerebral infarction affecting unspecified side: Secondary | ICD-10-CM

## 2024-05-12 DIAGNOSIS — Z888 Allergy status to other drugs, medicaments and biological substances status: Secondary | ICD-10-CM

## 2024-05-12 DIAGNOSIS — R5381 Other malaise: Secondary | ICD-10-CM | POA: Diagnosis present

## 2024-05-12 DIAGNOSIS — Z79899 Other long term (current) drug therapy: Secondary | ICD-10-CM

## 2024-05-12 DIAGNOSIS — Z79890 Hormone replacement therapy (postmenopausal): Secondary | ICD-10-CM

## 2024-05-12 DIAGNOSIS — Z87898 Personal history of other specified conditions: Secondary | ICD-10-CM

## 2024-05-12 DIAGNOSIS — I639 Cerebral infarction, unspecified: Secondary | ICD-10-CM | POA: Diagnosis present

## 2024-05-12 DIAGNOSIS — N319 Neuromuscular dysfunction of bladder, unspecified: Secondary | ICD-10-CM | POA: Diagnosis present

## 2024-05-12 DIAGNOSIS — I4891 Unspecified atrial fibrillation: Secondary | ICD-10-CM | POA: Diagnosis present

## 2024-05-12 DIAGNOSIS — Z823 Family history of stroke: Secondary | ICD-10-CM

## 2024-05-12 DIAGNOSIS — Z91013 Allergy to seafood: Secondary | ICD-10-CM

## 2024-05-12 DIAGNOSIS — I69251 Hemiplegia and hemiparesis following other nontraumatic intracranial hemorrhage affecting right dominant side: Secondary | ICD-10-CM

## 2024-05-12 DIAGNOSIS — F411 Generalized anxiety disorder: Secondary | ICD-10-CM | POA: Diagnosis present

## 2024-05-12 DIAGNOSIS — Z880 Allergy status to penicillin: Secondary | ICD-10-CM

## 2024-05-12 DIAGNOSIS — H409 Unspecified glaucoma: Secondary | ICD-10-CM | POA: Diagnosis present

## 2024-05-12 DIAGNOSIS — R1110 Vomiting, unspecified: Secondary | ICD-10-CM | POA: Diagnosis present

## 2024-05-12 DIAGNOSIS — Z87891 Personal history of nicotine dependence: Secondary | ICD-10-CM

## 2024-05-12 DIAGNOSIS — Z803 Family history of malignant neoplasm of breast: Secondary | ICD-10-CM

## 2024-05-12 DIAGNOSIS — I480 Paroxysmal atrial fibrillation: Secondary | ICD-10-CM | POA: Diagnosis present

## 2024-05-12 LAB — URINALYSIS, W/ REFLEX TO CULTURE (INFECTION SUSPECTED)
Bilirubin Urine: NEGATIVE
Glucose, UA: NEGATIVE mg/dL
Ketones, ur: 5 mg/dL — AB
Nitrite: NEGATIVE
Protein, ur: 30 mg/dL — AB
RBC / HPF: >50 RBC/hpf (ref 0–5)
Specific Gravity, Urine: 1.019 (ref 1.005–1.030)
WBC, UA: >50 WBC/hpf (ref 0–5)
pH: 6 (ref 5.0–8.0)

## 2024-05-12 LAB — CBC WITH DIFFERENTIAL/PLATELET
Abs Immature Granulocytes: 0.07 K/uL (ref 0.00–0.07)
Basophils Absolute: 0.1 K/uL (ref 0.0–0.1)
Basophils Relative: 1 %
Eosinophils Absolute: 0.2 K/uL (ref 0.0–0.5)
Eosinophils Relative: 1 %
HCT: 54.3 % — ABNORMAL HIGH (ref 39.0–52.0)
Hemoglobin: 17.3 g/dL — ABNORMAL HIGH (ref 13.0–17.0)
Immature Granulocytes: 1 %
Lymphocytes Relative: 3 %
Lymphs Abs: 0.4 K/uL — ABNORMAL LOW (ref 0.7–4.0)
MCH: 26.3 pg (ref 26.0–34.0)
MCHC: 31.9 g/dL (ref 30.0–36.0)
MCV: 82.4 fL (ref 80.0–100.0)
Monocytes Absolute: 0.7 K/uL (ref 0.1–1.0)
Monocytes Relative: 4 %
Neutro Abs: 14 K/uL — ABNORMAL HIGH (ref 1.7–7.7)
Neutrophils Relative %: 90 %
Platelets: 522 K/uL — ABNORMAL HIGH (ref 150–400)
RBC: 6.59 MIL/uL — ABNORMAL HIGH (ref 4.22–5.81)
RDW: 18.1 % — ABNORMAL HIGH (ref 11.5–15.5)
WBC: 15.5 K/uL — ABNORMAL HIGH (ref 4.0–10.5)
nRBC: 0 % (ref 0.0–0.2)

## 2024-05-12 LAB — RESP PANEL BY RT-PCR (RSV, FLU A&B, COVID)  RVPGX2
Influenza A by PCR: NEGATIVE
Influenza B by PCR: NEGATIVE
Resp Syncytial Virus by PCR: NEGATIVE
SARS Coronavirus 2 by RT PCR: NEGATIVE

## 2024-05-12 LAB — COMPREHENSIVE METABOLIC PANEL WITH GFR
ALT: 15 U/L (ref 0–44)
AST: 21 U/L (ref 15–41)
Albumin: 3.3 g/dL — ABNORMAL LOW (ref 3.5–5.0)
Alkaline Phosphatase: 110 U/L (ref 38–126)
Anion gap: 11 (ref 5–15)
BUN: 20 mg/dL (ref 8–23)
CO2: 23 mmol/L (ref 22–32)
Calcium: 8.8 mg/dL — ABNORMAL LOW (ref 8.9–10.3)
Chloride: 103 mmol/L (ref 98–111)
Creatinine, Ser: 1.08 mg/dL (ref 0.61–1.24)
GFR, Estimated: >60 mL/min (ref >60)
Glucose, Bld: 88 mg/dL (ref 70–99)
Potassium: 4.1 mmol/L (ref 3.5–5.1)
Sodium: 137 mmol/L (ref 135–145)
Total Bilirubin: 0.9 mg/dL (ref 0.0–1.2)
Total Protein: 7.1 g/dL (ref 6.5–8.1)

## 2024-05-12 LAB — LACTIC ACID, PLASMA
Lactic Acid, Venous: 1.2 mmol/L (ref 0.5–1.9)
Lactic Acid, Venous: 1.4 mmol/L (ref 0.5–1.9)

## 2024-05-12 LAB — PROTIME-INR
INR: 1.3 — ABNORMAL HIGH (ref 0.8–1.2)
Prothrombin Time: 16.7 s — ABNORMAL HIGH (ref 11.4–15.2)

## 2024-05-12 LAB — MAGNESIUM: Magnesium: 1.8 mg/dL (ref 1.7–2.4)

## 2024-05-12 LAB — PHOSPHORUS: Phosphorus: 1.8 mg/dL — ABNORMAL LOW (ref 2.5–4.6)

## 2024-05-12 LAB — MRSA NEXT GEN BY PCR, NASAL: MRSA by PCR Next Gen: NOT DETECTED

## 2024-05-12 MED ORDER — SENNOSIDES-DOCUSATE SODIUM 8.6-50 MG PO TABS: 1.0000 | ORAL_TABLET | Freq: Every evening | ORAL | Status: DC | PRN

## 2024-05-12 MED ORDER — ACETAMINOPHEN 650 MG RE SUPP
650.0000 mg | Freq: Once | RECTAL | Status: AC
  Administered 2024-05-12: 650 mg via RECTAL
  Filled 2024-05-12: qty 1

## 2024-05-12 MED ORDER — ONDANSETRON HCL 4 MG PO TABS: 4.0000 mg | ORAL_TABLET | Freq: Four times a day (QID) | ORAL | Status: DC | PRN

## 2024-05-12 MED ORDER — SODIUM CHLORIDE 0.9% FLUSH
3.0000 mL | Freq: Two times a day (BID) | INTRAVENOUS | Status: DC
  Administered 2024-05-12 – 2024-05-16 (×8): 3 mL via INTRAVENOUS

## 2024-05-12 MED ORDER — VANCOMYCIN HCL IN DEXTROSE 1-5 GM/200ML-% IV SOLN: 1000.0000 mg | Freq: Once | INTRAVENOUS | Status: DC

## 2024-05-12 MED ORDER — SODIUM CHLORIDE 0.9 % IV SOLN
2.0000 g | INTRAVENOUS | Status: DC
  Administered 2024-05-12 – 2024-05-16 (×5): 2 g via INTRAVENOUS
  Filled 2024-05-12 (×5): qty 20

## 2024-05-12 MED ORDER — LACTATED RINGERS IV SOLN: 150.0000 mL/h | INTRAVENOUS | Status: DC

## 2024-05-12 MED ORDER — LOSARTAN POTASSIUM 25 MG PO TABS
25.0000 mg | ORAL_TABLET | Freq: Every day | ORAL | Status: DC
Start: 2024-05-13 — End: 2024-05-13
  Filled 2024-05-12: qty 1

## 2024-05-12 MED ORDER — LEVALBUTEROL HCL 0.63 MG/3ML IN NEBU: 0.6300 mg | INHALATION_SOLUTION | Freq: Four times a day (QID) | RESPIRATORY_TRACT | Status: DC | PRN

## 2024-05-12 MED ORDER — HEPARIN SODIUM (PORCINE) 5000 UNIT/ML IJ SOLN: 5000.0000 [IU] | Freq: Three times a day (TID) | INTRAMUSCULAR | Status: DC

## 2024-05-12 MED ORDER — LACTATED RINGERS IV BOLUS (SEPSIS)
1000.0000 mL | Freq: Once | INTRAVENOUS | Status: AC
  Administered 2024-05-12: 1000 mL via INTRAVENOUS

## 2024-05-12 MED ORDER — TAMSULOSIN HCL 0.4 MG PO CAPS
0.4000 mg | ORAL_CAPSULE | Freq: Every day | ORAL | Status: DC
Start: 2024-05-12 — End: 2024-05-17
  Administered 2024-05-12 – 2024-05-16 (×5): 0.4 mg via ORAL
  Filled 2024-05-12 (×5): qty 1

## 2024-05-12 MED ORDER — BISACODYL 5 MG PO TBEC: 5.0000 mg | DELAYED_RELEASE_TABLET | Freq: Every day | ORAL | Status: DC | PRN

## 2024-05-12 MED ORDER — VANCOMYCIN HCL 1500 MG/300ML IV SOLN
1500.0000 mg | Freq: Once | INTRAVENOUS | Status: AC
Start: 2024-05-12 — End: 2024-05-12
  Administered 2024-05-12: 1500 mg via INTRAVENOUS
  Filled 2024-05-12: qty 300

## 2024-05-12 MED ORDER — SODIUM CHLORIDE 0.9 % IV SOLN
2.0000 g | Freq: Once | INTRAVENOUS | Status: AC
  Administered 2024-05-12: 2 g via INTRAVENOUS
  Filled 2024-05-12: qty 12.5

## 2024-05-12 MED ORDER — HYDROCORTISONE (PERIANAL) 2.5 % EX CREA
1.0000 | TOPICAL_CREAM | Freq: Two times a day (BID) | CUTANEOUS | Status: DC
  Administered 2024-05-12 – 2024-05-16 (×7): 1 via RECTAL
  Filled 2024-05-12: qty 28.35

## 2024-05-12 MED ORDER — BACLOFEN 10 MG PO TABS
5.0000 mg | ORAL_TABLET | Freq: Three times a day (TID) | ORAL | Status: DC
Start: 2024-05-12 — End: 2024-05-17
  Administered 2024-05-12 – 2024-05-16 (×13): 5 mg via ORAL
  Filled 2024-05-12 (×14): qty 1

## 2024-05-12 MED ORDER — TIMOLOL MALEATE 0.25 % OP SOLN
1.0000 [drp] | Freq: Every day | OPHTHALMIC | Status: DC
  Administered 2024-05-13 – 2024-05-16 (×4): 1 [drp] via OPHTHALMIC
  Filled 2024-05-12 (×2): qty 5

## 2024-05-12 MED ORDER — PREGABALIN 25 MG PO CAPS
50.0000 mg | ORAL_CAPSULE | Freq: Two times a day (BID) | ORAL | Status: DC
Start: 2024-05-12 — End: 2024-05-13
  Administered 2024-05-12: 50 mg via ORAL
  Filled 2024-05-12 (×2): qty 2

## 2024-05-12 MED ORDER — AMLODIPINE BESYLATE 5 MG PO TABS
2.5000 mg | ORAL_TABLET | Freq: Every day | ORAL | Status: DC
Start: 2024-05-13 — End: 2024-05-13
  Filled 2024-05-12: qty 1

## 2024-05-12 MED ORDER — METHOCARBAMOL 500 MG PO TABS
500.0000 mg | ORAL_TABLET | Freq: Three times a day (TID) | ORAL | Status: DC | PRN
Start: 2024-05-12 — End: 2024-05-17

## 2024-05-12 MED ORDER — IPRATROPIUM BROMIDE 0.02 % IN SOLN: 0.5000 mg | Freq: Four times a day (QID) | RESPIRATORY_TRACT | Status: DC | PRN

## 2024-05-12 MED ORDER — ONDANSETRON HCL 4 MG/2ML IJ SOLN: 4.0000 mg | Freq: Four times a day (QID) | INTRAMUSCULAR | Status: DC | PRN

## 2024-05-12 MED ORDER — LACTATED RINGERS IV BOLUS (SEPSIS): 250.0000 mL | Freq: Once | INTRAVENOUS | Status: DC

## 2024-05-12 MED ORDER — FLEET ENEMA RE ENEM: 1.0000 | ENEMA | Freq: Once | RECTAL | Status: DC | PRN

## 2024-05-12 MED ORDER — POLYETHYLENE GLYCOL 3350 17 G PO PACK
17.0000 g | PACK | Freq: Every day | ORAL | Status: DC
  Administered 2024-05-13 – 2024-05-16 (×3): 17 g via ORAL
  Filled 2024-05-12 (×3): qty 1

## 2024-05-12 MED ORDER — TRAZODONE HCL 50 MG PO TABS
25.0000 mg | ORAL_TABLET | Freq: Every evening | ORAL | Status: DC | PRN
  Administered 2024-05-12 – 2024-05-13 (×2): 25 mg via ORAL
  Filled 2024-05-12 (×2): qty 1

## 2024-05-12 MED ORDER — APIXABAN 2.5 MG PO TABS
2.5000 mg | ORAL_TABLET | Freq: Two times a day (BID) | ORAL | Status: DC
  Administered 2024-05-12 – 2024-05-16 (×9): 2.5 mg via ORAL
  Filled 2024-05-12 (×9): qty 1

## 2024-05-12 MED ORDER — LATANOPROST 0.005 % OP SOLN
1.0000 [drp] | Freq: Every day | OPHTHALMIC | Status: DC
  Administered 2024-05-12 – 2024-05-16 (×5): 1 [drp] via OPHTHALMIC
  Filled 2024-05-12 (×2): qty 2.5

## 2024-05-12 MED ORDER — FAMOTIDINE 20 MG PO TABS
20.0000 mg | ORAL_TABLET | Freq: Every day | ORAL | Status: DC
Start: 2024-05-13 — End: 2024-05-17
  Administered 2024-05-13 – 2024-05-16 (×4): 20 mg via ORAL
  Filled 2024-05-12 (×4): qty 1

## 2024-05-12 MED ORDER — HYDROMORPHONE HCL 1 MG/ML IJ SOLN: 0.5000 mg | INTRAMUSCULAR | Status: DC | PRN

## 2024-05-12 MED ORDER — METRONIDAZOLE 500 MG/100ML IV SOLN
500.0000 mg | Freq: Once | INTRAVENOUS | Status: AC
  Administered 2024-05-12: 500 mg via INTRAVENOUS
  Filled 2024-05-12: qty 100

## 2024-05-12 MED ORDER — SODIUM CHLORIDE 0.9% FLUSH
3.0000 mL | Freq: Two times a day (BID) | INTRAVENOUS | Status: DC
  Administered 2024-05-12 – 2024-05-16 (×8): 3 mL via INTRAVENOUS

## 2024-05-12 MED ORDER — SODIUM CHLORIDE 0.9 % IV BOLUS: 1000.0000 mL | Freq: Once | INTRAVENOUS | Status: DC

## 2024-05-12 MED ORDER — HYDRALAZINE HCL 20 MG/ML IJ SOLN: 10.0000 mg | INTRAMUSCULAR | Status: DC | PRN

## 2024-05-12 MED ORDER — CHLORHEXIDINE GLUCONATE CLOTH 2 % EX PADS
6.0000 | MEDICATED_PAD | Freq: Every day | CUTANEOUS | Status: DC
  Administered 2024-05-13 – 2024-05-16 (×4): 6 via TOPICAL

## 2024-05-12 MED ORDER — OXYCODONE HCL 5 MG PO TABS
5.0000 mg | ORAL_TABLET | ORAL | Status: DC | PRN
  Administered 2024-05-12 – 2024-05-14 (×3): 5 mg via ORAL
  Filled 2024-05-12 (×3): qty 1

## 2024-05-12 MED ORDER — ACETAMINOPHEN 325 MG PO TABS
650.0000 mg | ORAL_TABLET | Freq: Four times a day (QID) | ORAL | Status: DC | PRN
  Administered 2024-05-12 – 2024-05-16 (×3): 650 mg via ORAL
  Filled 2024-05-12 (×3): qty 2

## 2024-05-12 MED ORDER — ACETAMINOPHEN 650 MG RE SUPP: 650.0000 mg | Freq: Four times a day (QID) | RECTAL | Status: DC | PRN

## 2024-05-12 MED ORDER — LACTATED RINGERS IV SOLN: INTRAVENOUS | Status: AC

## 2024-05-12 MED ORDER — MELATONIN 5 MG PO TABS
5.0000 mg | ORAL_TABLET | Freq: Every day | ORAL | Status: DC
Start: 2024-05-12 — End: 2024-05-14
  Filled 2024-05-12 (×3): qty 1

## 2024-05-12 NOTE — Assessment & Plan Note (Signed)
-   Monitor for hypotension due to soft sepsis -Home medication of of Norvasc , losartan --will restart cautiously

## 2024-05-12 NOTE — Assessment & Plan Note (Signed)
 Continue home eyedrops

## 2024-05-12 NOTE — Assessment & Plan Note (Signed)
-   Noted neurogenic bladder-chronic Foley catheter, was replaced today in ED 05/12/2024

## 2024-05-12 NOTE — ED Notes (Addendum)
 Attempted to place Foley and patient was bleeding from his penis beforehand, as soon as the catheter was pushed in, blood started gushing out. RN notified and tried to deflate the balloon and advance it more but was unsuccessful.

## 2024-05-12 NOTE — H&P (Signed)
 History and Physical   Patient: Mason [name]                            PCP: Cleatus Arlyss RAMAN, MD                    DOB: [birth year]            DOA: 05/12/2024 FMW:[id number]             DOS: 05/12/2024, 4:33 PM  Cleatus Arlyss RAMAN, MD  Patient coming from:   HOME  I have personally reviewed patient's medical records, in electronic medical records, including:  [place] link, and care everywhere.    Chief Complaint:   Chief Complaint  Patient presents with   Emesis    History of present illness:    Mason [name] is a 78 yo male with pmhx significant for depression, anxiety, GERD, IBS, skin cancer, HTN, CVA, afib (on Eliquis ).  Pt resides at [facility] due to his CVA -hemiplegia, chronic right-sided weakness. The facility suspected UTI due to dysuria sent a urine sample for cx on 9/16.  It came back today growing over > 100 K colony forming units.  It did not specify which bacteria.    Picture of cx results in media section as it's MDRO.   He apparently has an indwelling FC, but pulled it out today.  Per ED report patient was febrile, and started vomiting today. Poor historian very few words  ED Course: Blood pressure 113/66, pulse 89, temperature (!) 103.3 F (39.6 C), temperature source Rectal, resp. rate 19, weight 71.7 kg, SpO2 98%. LABs: WBC; 15.5, platelets of 522, lactic acid 1.2, UA: Hazy, large Hgb, large leukocyte esterase, 30 protein, bacteria rare, WBC >50 CXR-no acute cardiopulmonary process, questionable small nodule  Patient was noted for sepsis:  likely from urine.  Treated with IV flagyl , maxipime , and vancomycin .    No other source was identified - Urinary retention: Foley catheter was placed   Patient Denies having: Fever, Chills, Cough, SOB, Chest Pain, Abd pain, N/V/D, headache, dizziness, lightheadedness,  Dysuria, Joint pain, rash, open wounds    Review of Systems: As per HPI, otherwise 10 point review of systems were negative.    ----------------------------------------------------------------------------------------------------------------------  Allergies  Allergen Reactions   Shellfish Allergy Shortness Of Breath, Rash and Other (See Comments)    GI upset Difficulty breathing  Shrimp and crab   Celexa  [Citalopram ] Other (See Comments)    Aggression    Levsin [Hyoscyamine] Other (See Comments)    Urinary retention   Librax [Chlordiazepoxide-Clidinium] Other (See Comments)    Urinary retention   Ak-Mycin [Erythromycin] Other (See Comments)    GI upset   Mobic  [Meloxicam ] Other (See Comments)    GI upset   Penicillins Itching, Rash and Dermatitis    Received Unasyn  earlier without acute reaction    Home MEDs:  Prior to Admission medications   Medication Sig Start Date End Date Taking? Authorizing Provider  amLODipine  (NORVASC ) 2.5 MG tablet Take 2.5 mg by mouth daily. 03/14/24   [provider]  b complex vitamins capsule Take 1 capsule by mouth daily.    [provider]  Baclofen  5 MG TABS Take 1 tablet by mouth 3 (three) times daily. 03/14/24   [provider]  chlorhexidine  (PERIDEX ) 0.12 % solution Use as directed 5 mLs in the mouth or throat 2 (two) times daily. 03/23/24   [provider]  Cholecalciferol (VITAMIN D) 125 MCG (5000 UT) CAPS Take 1 tablet by mouth daily.    [provider]  ELIQUIS  2.5 MG TABS tablet Take 2.5 mg by mouth 2 (two) times daily. 03/14/24   [provider]  famotidine  (PEPCID ) 20 MG tablet Take 20 mg by mouth daily. 12/14/23   [provider]  fluticasone  (FLONASE ) 50 MCG/ACT nasal spray Place 1 spray into both nostrils daily. 05/05/23   Samtani, Jai-Gurmukh, MD  latanoprost  (XALATAN ) 0.005 % ophthalmic solution SMARTSIG:In Eye(s) 01/25/24   [provider]  losartan  (COZAAR ) 25 MG tablet Take 1 tablet (25 mg total) by mouth daily. 05/05/23   Samtani, Jai-Gurmukh, MD  melatonin 3 MG TABS tablet Take 1 tablet  (3 mg total) by mouth at bedtime as needed. 05/05/23   Samtani, Jai-Gurmukh, MD  methocarbamol  (ROBAXIN ) 500 MG tablet Take 500 mg by mouth 3 (three) times daily. 12/14/23   [provider]  polyethylene glycol (MIRALAX  / GLYCOLAX ) 17 g packet Take 17 g by mouth 2 (two) times daily. 05/05/23   Samtani, Jai-Gurmukh, MD  pregabalin  (LYRICA ) 50 MG capsule Take 50 mg by mouth 2 (two) times daily. 04/18/24   [provider]  PROCTOZONE -HC 2.5 % rectal cream Place 1 Application rectally 2 (two) times daily. 04/29/24   [provider]  senna-docusate (SENOKOT-S) 8.6-50 MG tablet Take 1 tablet by mouth at bedtime as needed for mild constipation. 05/05/23   Samtani, Jai-Gurmukh, MD  tamsulosin  (FLOMAX ) 0.4 MG CAPS capsule Take 1 capsule (0.4 mg total) by mouth daily after supper. 05/05/23   Samtani, Jai-Gurmukh, MD  timolol  (BETIMOL ) 0.25 % ophthalmic solution Apply to eye. 06/08/20   [provider]  timolol  (TIMOPTIC ) 0.25 % ophthalmic solution Place 1 drop into both eyes daily. 04/11/24   [provider]  traMADol  (ULTRAM ) 50 MG tablet Take 50 mg by mouth 3 (three) times daily. 07/17/23   [provider]  traZODone  (DESYREL ) 50 MG tablet Take 25 mg by mouth at bedtime as needed. 03/14/24   [provider]  vitamin C (ASCORBIC ACID) 500 MG tablet Take 500 mg by mouth daily.    [provider]  zinc gluconate 50 MG tablet Take 50 mg by mouth daily.    [provider]    PRN MEDs: acetaminophen  **OR** acetaminophen , bisacodyl , hydrALAZINE , HYDROmorphone  (DILAUDID ) injection, ipratropium, levalbuterol , methocarbamol , ondansetron  **OR** ondansetron  (ZOFRAN ) IV, oxyCODONE , senna-docusate, traZODone   Past Medical History:  Diagnosis Date   Actinic keratosis 04/02/2013   L sup scapula - bx proven   Actinic keratosis 04/02/2013   R med ant deltoid - bx proven   Allergy    Anal fissure    Anxiety    Basal cell carcinoma    back -  treated in the past    Basal cell carcinoma 05/02/2009   left supratip, Mohs   Cancer (HCC)    basal cell removed x4   Cataract    bil eyes   Depression    Diverticulosis of colon    GERD (gastroesophageal reflux disease)    esophageal narrowing   Glaucoma    Hiatal hernia    IBS (irritable bowel syndrome)    Insomnia    Internal hemorrhoids     Past Surgical History:  Procedure Laterality Date   ANAL SPHINCTEROTOMY  03/1995   fistulotomy   APPENDECTOMY     BASAL CELL CARCINOMA EXCISION     on back   BICEPT TENODESIS Right 09/27/2004  decompression   COLONOSCOPY  04/1999   internal hemmroids,12-11-08 colon tics and hems only    ESOPHAGOGASTRODUODENOSCOPY  04/1999   with esophagitis and stricture   MOHS SURGERY  09/02/2008   L supratip of nose with flap,basal cell cancer [facility]/MOHS   MOUTH SURGERY  01/2020   TONGUE SURGERY  2017   had small place removed, noncancerous   TONSILLECTOMY AND ADENOIDECTOMY     remote   UPPER GASTROINTESTINAL ENDOSCOPY     WRIST SURGERY Right    WRIST SURGERY     scar tissue removed     reports that he quit smoking about 25 years ago. His smoking use included cigarettes and cigars. He has never used smokeless tobacco. He reports that he does not currently use alcohol. He reports that he does not use drugs.   Family History  Problem Relation Age of Onset   Hypertension Mother    Stroke Mother    Pneumonia Father    Bladder Cancer Father    Cancer Maternal Aunt        ? type   Breast cancer Maternal Aunt        mets   Esophageal cancer Maternal Aunt        aunt ? esophageal ca   Stomach cancer Maternal Aunt        aunt ? stmach ca   Colon cancer Maternal Aunt        had colostomy - not sure of origin   Cancer Maternal Grandmother        ? type   Prostate cancer Neg Hx    Rectal cancer Neg Hx     Physical Exam:   Vitals:   05/12/24 1330 05/12/24 1400 05/12/24 1445 05/12/24 1600  BP: 114/69 109/64 113/66   Pulse: (!) 101  89    Resp: 14 17 19    Temp:    97.7 F (36.5 C)  TempSrc:    Oral  SpO2: 96% 98%    Weight:       Constitutional: NAD, calm, comfortable -minimal verbal communication Eyes: PERRL, lids and conjunctivae normal ENMT: Mucous membranes are moist. Posterior pharynx clear of any exudate or lesions.Normal dentition.  Neck: normal, supple, no masses, no thyromegaly Respiratory: clear to auscultation bilaterally, no wheezing, no crackles. Normal respiratory effort. No accessory muscle use.  Cardiovascular: Regular rate and rhythm, no murmurs / rubs / gallops. No extremity edema. 2+ pedal pulses. No carotid bruits.  Abdomen: no tenderness, no masses palpated. No hepatosplenomegaly. Bowel sounds positive.  Musculoskeletal: Limited exam  from previous CVA-hemiplegic was diagnosed Rt. Sided weakness at baseline  no clubbing / cyanosis. No joint deformity upper and lower extremities. G Neurologic: Limited exam -chronic right-sided weakness, hemiplegic from previous strokes, minimal verbal communication-sensation seems to be intact, cranial nerves intact Psychiatric: Mood stable, minimal verbal communication Skin: no rashes, lesions, ulcers. No induration      Labs on admission:    I have personally reviewed following labs and imaging studies  CBC: Recent Labs  Lab 05/12/24 1309  WBC 15.5*  NEUTROABS 14.0*  HGB 17.3*  HCT 54.3*  MCV 82.4  PLT 522*   Basic Metabolic Panel: Recent Labs  Lab 05/12/24 1309 05/12/24 1551  NA 137  --   K 4.1  --   CL 103  --   CO2 23  --   GLUCOSE 88  --   BUN 20  --   CREATININE 1.08  --   CALCIUM 8.8*  --  MG  --  1.8  PHOS  --  1.8*   GFR: Estimated Creatinine Clearance: 55.5 mL/min (by C-G formula based on SCr of 1.08 mg/dL). Liver Function Tests: Recent Labs  Lab 05/12/24 1309  AST 21  ALT 15  ALKPHOS 110  BILITOT 0.9  PROT 7.1  ALBUMIN 3.3*   Coagulation Profile: Recent Labs  Lab 05/12/24 1309  INR 1.3*    Urine  analysis:    Component Value Date/Time   COLORURINE YELLOW 05/12/2024 1508   APPEARANCEUR HAZY (A) 05/12/2024 1508   LABSPEC 1.019 05/12/2024 1508   PHURINE 6.0 05/12/2024 1508   GLUCOSEU NEGATIVE 05/12/2024 1508   GLUCOSEU NEGATIVE 10/07/2022 0847   HGBUR LARGE (A) 05/12/2024 1508   HGBUR negative 01/01/2009 0952   BILIRUBINUR NEGATIVE 05/12/2024 1508   KETONESUR 5 (A) 05/12/2024 1508   PROTEINUR 30 (A) 05/12/2024 1508   UROBILINOGEN 0.2 10/07/2022 0847   NITRITE NEGATIVE 05/12/2024 1508   LEUKOCYTESUR LARGE (A) 05/12/2024 1508    Last A1C:  Lab Results  Component Value Date   HGBA1C 6.0 (H) 04/26/2023     Radiologic Exams on Admission:   DG Chest Port 1 View Result Date: 05/12/2024 CLINICAL DATA:  Sepsis. EXAM: PORTABLE CHEST 1 VIEW COMPARISON:  05/02/2023 FINDINGS: The lungs are clear without focal pneumonia, edema, pneumothorax or pleural effusion. Tiny nodule is seen immediately adjacent to the into the anterior right first rib, potentially related to the bony anatomy, but underlying right upper lobe pulmonary nodule could have this appearance. The cardiopericardial silhouette is within normal limits for size. No acute bony abnormality. Telemetry leads overlie the chest. IMPRESSION: 1. No acute cardiopulmonary findings. 2. Tiny nodule in the right upper lobe may be related to the bony anatomy, but underlying pulmonary nodule could have this appearance. No pulmonary nodule seen in this region on chest CT of 04/26/2023. CT chest without contrast recommended to further evaluate. Electronically Signed   By: Camellia Candle M.D.   On: 05/12/2024 13:33    EKG:   Independently reviewed.  Orders placed or performed during the hospital encounter of 05/12/24   EKG 12-Lead   EKG 12-Lead   ED EKG   ED EKG   EKG 12-Lead   ---------------------------------------------------------------------------------------------------------------------------------------    Assessment / Plan:    Principal Problem:   Sepsis (HCC) Active Problems:   Atrial fibrillation (HCC)   Ambulatory dysfunction   Anxiety state   BPH (benign prostatic hyperplasia)   History of urinary retention   GERD (gastroesophageal reflux disease)   Glaucoma   Irritable bowel syndrome   Depression   Stroke (cerebrum) (HCC)   History of hemorrhagic stroke with residual hemiplegia (HCC)   Essential hypertension   Assessment and Plan: * Sepsis (HCC) POA: Met sepsis criteria with Temp.(!) 103.3 F (39.6 C), HR 110,,   WBC; 15.5, platelets of 522, lactic acid 1.2, UA: Hazy, large hemoglobin, with leukocyte esterase, WBC > 50  Patient was noted for sepsis:  likely from urine.  In ED received IV flagyl , maxipime , and vancomycin .     - Urine report from facility was reviewed, > 100 K colony of unspecified bacteria, multiresistant but sensitive to cefepime , Rocephin   Per sepsis protocol will continue with IV fluid resuscitation -Will follow-up with urine/blood cultures >>  - Will continue with IV Rocephin  2 g daily  No other source was identified - Urinary retention: FC placed -Currently hemodynamically stable: Satting 98% on 2 L of oxygen,  Ambulatory dysfunction - Due to stroke paraplegic,  fall precautions, PT OT when stable  Atrial fibrillation (HCC) - In normal sinus rhythm, continue Eliquis , -No rate control medications such as beta-blocker noted on the med list  Sepsis (HCC)-resolved as of 05/12/2024 -Urosepsis due to chronic Foley catheter -POA met sepsis criteria with leukocytosis, fever, source of infection urine with a chronic indwelling catheter -Initiated on broad-spectrum antibiotics vanc//Flagyl /cefepime  -Blood and urine cultures were obtained in ED   -UA: Positive for leukocyte esterase, negative for nitrite WBC>50 Source of infection UTI/pyelonephritis -Urine from the facility was reviewed greater than 100 K colonies does not specify the bacteria multiresistant, sensitive  to cefepime , Rocephin  -Sepsis protocol initiated-continue with IV fluid resuscitation, -Will continue with IV antibiotics of Rocephin  -  History of urinary retention - Noted neurogenic bladder-chronic Foley catheter, was replaced today in ED 05/12/2024  BPH (benign prostatic hyperplasia) - With history of neurogenic bladder, urinary retention, -Continue home medication of Flomax   Anxiety state - Currently stable, continue home medication trazodone , as needed benzodiazepines  Glaucoma Continue home eyedrops  GERD (gastroesophageal reflux disease) Continue PPI   Essential hypertension - Monitor for hypotension due to soft sepsis -Home medication of of Norvasc , losartan --will restart cautiously   History of hemorrhagic stroke with residual hemiplegia (HCC) - Hematologic with debility, restricted mobility, urinary retention, -Once stable reevaluating PT OT -Continuing home medication of baclofen , Eliquis , as needed Robaxin  -Change position in bed every 2 hours to avoid pressure ulcers  Irritable bowel syndrome - As needed bowel regimen              Consults called:  None -------------------------------------------------------------------------------------------------------------------------------------------- DVT prophylaxis:  apixaban  (ELIQUIS ) tablet 2.5 mg Start: 05/12/24 2200 SCDs Start: 05/12/24 1538 apixaban  (ELIQUIS ) tablet 2.5 mg   Code Status:   Code Status: Do not attempt resuscitation (DNR) PRE-ARREST INTERVENTIONS DESIRED   Admission status: Patient will be admitted as Inpatient, with a greater than 2 midnight length of stay. Level of care: Stepdown   Family Communication:  none at bedside  (The above findings and plan of care has been discussed with patient in detail, the patient expressed understanding and agreement of above plan)   --------------------------------------------------------------------------------------------------------------------------------------------------  Disposition Plan:  Anticipated 1-2 days Status is: Observation The patient remains OBS appropriate and will d/c before 2 midnights.     ----------------------------------------------------------------------------------------------------------------------------------------------------  Time spent:  26  Min.  Was spent seeing and evaluating the patient, reviewing all medical records, drawn plan of care.  SIGNED: Adriana DELENA Grams, MD, FHM. FAAFP. [facility], Pager  (Please use [email or web address] to page/ or secure chat through epic) If 7PM-7AM, please contact night-coverage [email or web address],  05/12/2024, 4:33 PM

## 2024-05-12 NOTE — Sepsis Progress Note (Signed)
 Elink following code sepsis

## 2024-05-12 NOTE — Assessment & Plan Note (Signed)
-   Currently stable, continue home medication trazodone , as needed benzodiazepines

## 2024-05-12 NOTE — Assessment & Plan Note (Signed)
-   Due to stroke paraplegic, fall precautions, PT OT when stable

## 2024-05-12 NOTE — Assessment & Plan Note (Signed)
 As needed bowel regimen

## 2024-05-12 NOTE — Assessment & Plan Note (Signed)
-   With history of neurogenic bladder, urinary retention, -Continue home medication of Flomax

## 2024-05-12 NOTE — ED Provider Notes (Signed)
 [name] Provider Note   CSN: [id number] Arrival date & time: 05/12/24  1202     Patient presents with: Emesis   Mason [name] is a 78 y.o. male.   Pt is a 78 yo male with pmhx significant for depression, anxiety, GERD, IBS, skin cancer, HTN, CVA, afib (on Eliquis ).  Pt resides at [facility] due to his CVA.  The facility sent a urine sample for cx on 9/16.  It came back today growing over 100,000 colony forming units.  It did not specify which bacteria.  Picture of cx results in media section as it's MDRO.   Pt normally has an indwelling FC, but pulled it out today.  Pt was also vomiting today.       Prior to Admission medications   Medication Sig Start Date End Date Taking? Authorizing Provider  amLODipine  (NORVASC ) 2.5 MG tablet Take 2.5 mg by mouth daily. 03/14/24   [provider]  b complex vitamins capsule Take 1 capsule by mouth daily.    [provider]  Baclofen  5 MG TABS Take 1 tablet by mouth 3 (three) times daily. 03/14/24   [provider]  chlorhexidine  (PERIDEX ) 0.12 % solution Use as directed 5 mLs in the mouth or throat 2 (two) times daily. 03/23/24   [provider]  Cholecalciferol (VITAMIN D) 125 MCG (5000 UT) CAPS Take 1 tablet by mouth daily.    [provider]  ELIQUIS  2.5 MG TABS tablet Take 2.5 mg by mouth 2 (two) times daily. 03/14/24   [provider]  famotidine  (PEPCID ) 20 MG tablet Take 20 mg by mouth daily. 12/14/23   [provider]  fluticasone  (FLONASE ) 50 MCG/ACT nasal spray Place 1 spray into both nostrils daily. 05/05/23   Samtani, Jai-Gurmukh, MD  latanoprost  (XALATAN ) 0.005 % ophthalmic solution SMARTSIG:In Eye(s) 01/25/24   [provider]  losartan  (COZAAR ) 25 MG tablet Take 1 tablet (25 mg total) by mouth daily. 05/05/23   Samtani, Jai-Gurmukh, MD  melatonin 3 MG TABS tablet Take 1 tablet (3 mg total) by mouth at bedtime as  needed. 05/05/23   Samtani, Jai-Gurmukh, MD  methocarbamol  (ROBAXIN ) 500 MG tablet Take 500 mg by mouth 3 (three) times daily. 12/14/23   [provider]  polyethylene glycol (MIRALAX  / GLYCOLAX ) 17 g packet Take 17 g by mouth 2 (two) times daily. 05/05/23   Samtani, Jai-Gurmukh, MD  pregabalin  (LYRICA ) 50 MG capsule Take 50 mg by mouth 2 (two) times daily. 04/18/24   [provider]  PROCTOZONE -HC 2.5 % rectal cream Place 1 Application rectally 2 (two) times daily. 04/29/24   [provider]  senna-docusate (SENOKOT-S) 8.6-50 MG tablet Take 1 tablet by mouth at bedtime as needed for mild constipation. 05/05/23   Samtani, Jai-Gurmukh, MD  tamsulosin  (FLOMAX ) 0.4 MG CAPS capsule Take 1 capsule (0.4 mg total) by mouth daily after supper. 05/05/23   Samtani, Jai-Gurmukh, MD  timolol  (BETIMOL ) 0.25 % ophthalmic solution Apply to eye. 06/08/20   [provider]  timolol  (TIMOPTIC ) 0.25 % ophthalmic solution Place 1 drop into both eyes daily. 04/11/24   [provider]  traMADol  (ULTRAM ) 50 MG tablet Take 50 mg by mouth 3 (three) times daily. 07/17/23   [provider]  traZODone  (DESYREL ) 50 MG tablet Take 25 mg by mouth at bedtime as needed. 03/14/24   [provider]  vitamin C (ASCORBIC ACID) 500 MG tablet Take 500 mg by mouth daily.  [provider]  zinc gluconate 50 MG tablet Take 50 mg by mouth daily.    [provider]    Allergies: Shellfish allergy, Celexa  [citalopram ], Levsin [hyoscyamine], Librax [chlordiazepoxide-clidinium], Ak-mycin [erythromycin], Mobic  [meloxicam ], and Penicillins    Review of Systems  Unable to perform ROS: Mental status change  Constitutional:  Positive for fever.  All other systems reviewed and are negative.   Updated Vital Signs BP 113/66   Pulse 89   Temp (!) 103.3 F (39.6 C) (Rectal) Comment: MD AWARE  Resp 19   Wt 71.7 kg   SpO2 98%   BMI 23.33 kg/m   Physical Exam Vitals  and nursing note reviewed.  Constitutional:      General: He is in acute distress.     Appearance: Normal appearance. He is ill-appearing.  HENT:     Head: Normocephalic and atraumatic.     Right Ear: External ear normal.     Left Ear: External ear normal.     Nose: Nose normal.     Mouth/Throat:     Mouth: Mucous membranes are dry.  Eyes:     Extraocular Movements: Extraocular movements intact.     Conjunctiva/sclera: Conjunctivae normal.     Pupils: Pupils are equal, round, and reactive to light.  Cardiovascular:     Rate and Rhythm: Regular rhythm. Tachycardia present.     Pulses: Normal pulses.     Heart sounds: Normal heart sounds.  Pulmonary:     Effort: Pulmonary effort is normal.     Breath sounds: Normal breath sounds.  Abdominal:     General: Abdomen is flat. Bowel sounds are normal.     Palpations: Abdomen is soft.  Musculoskeletal:        General: Normal range of motion.     Cervical back: Normal range of motion and neck supple.  Skin:    General: Skin is warm.     Capillary Refill: Capillary refill takes less than 2 seconds.  Neurological:     Mental Status: He is alert. He is disoriented.     Comments: Right sided weakness from prior CVA  Psychiatric:        Mood and Affect: Mood normal.     (all labs ordered are listed, but only abnormal results are displayed) Labs Reviewed  COMPREHENSIVE METABOLIC PANEL WITH GFR - Abnormal; Notable for the following components:      Result Value   Calcium 8.8 (*)    Albumin 3.3 (*)    All other components within normal limits  CBC WITH DIFFERENTIAL/PLATELET - Abnormal; Notable for the following components:   WBC 15.5 (*)    RBC 6.59 (*)    Hemoglobin 17.3 (*)    HCT 54.3 (*)    RDW 18.1 (*)    Platelets 522 (*)    Neutro Abs 14.0 (*)    Lymphs Abs 0.4 (*)    All other components within normal limits  PROTIME-INR - Abnormal; Notable for the following components:   Prothrombin Time 16.7 (*)    INR 1.3 (*)     All other components within normal limits  CULTURE, BLOOD (ROUTINE X 2)  CULTURE, BLOOD (ROUTINE X 2)  RESP PANEL BY RT-PCR (RSV, FLU A&B, COVID)  RVPGX2  LACTIC ACID, PLASMA  LACTIC ACID, PLASMA  URINALYSIS, W/ REFLEX TO CULTURE (INFECTION SUSPECTED)    EKG: EKG Interpretation Date/Time:  Sunday May 12 2024 12:21:50 EDT Ventricular Rate:  108 PR Interval:  209 QRS Duration:  84 QT Interval:  303 QTC Calculation: 407 R Axis:   -47  Text Interpretation: Sinus tachycardia Borderline prolonged PR interval Probable left atrial enlargement Left anterior fascicular block Abnormal R-wave progression, early transition Since last tracing rate faster Confirmed by Dean Clarity [id number]) on 05/12/2024 2:39:55 PM  Radiology: [facility] Chest Port 1 View Result Date: 05/12/2024 CLINICAL DATA:  Sepsis. EXAM: PORTABLE CHEST 1 VIEW COMPARISON:  05/02/2023 FINDINGS: The lungs are clear without focal pneumonia, edema, pneumothorax or pleural effusion. Tiny nodule is seen immediately adjacent to the into the anterior right first rib, potentially related to the bony anatomy, but underlying right upper lobe pulmonary nodule could have this appearance. The cardiopericardial silhouette is within normal limits for size. No acute bony abnormality. Telemetry leads overlie the chest. IMPRESSION: 1. No acute cardiopulmonary findings. 2. Tiny nodule in the right upper lobe may be related to the bony anatomy, but underlying pulmonary nodule could have this appearance. No pulmonary nodule seen in this region on chest CT of 04/26/2023. CT chest without contrast recommended to further evaluate. Electronically Signed   By: Camellia Candle M.D.   On: 05/12/2024 13:33     Procedures   Medications Ordered in the ED  lactated ringers  infusion ( Intravenous New Bag/Given 05/12/24 1422)  sodium chloride  0.9 % bolus 1,000 mL (has no administration in time range)  lactated ringers  bolus 1,000 mL (has no administration in time  range)  lactated ringers  bolus 1,000 mL (0 mLs Intravenous Stopped 05/12/24 1520)  ceFEPIme  (MAXIPIME ) 2 g in sodium chloride  0.9 % 100 mL IVPB (0 g Intravenous Stopped 05/12/24 1520)  metroNIDAZOLE  (FLAGYL ) IVPB 500 mg (0 mg Intravenous Stopped 05/12/24 1520)  acetaminophen  (TYLENOL ) suppository 650 mg (650 mg Rectal Given 05/12/24 1336)  vancomycin  (VANCOREADY) IVPB 1500 mg/300 mL (1,500 mg Intravenous New Bag/Given 05/12/24 1335)                                    Medical Decision Making Amount and/or Complexity of Data Reviewed Labs: ordered. Radiology: ordered.  Risk OTC drugs. Prescription drug management. Decision regarding hospitalization.   This patient presents to the ED for concern of sepsis, this involves an extensive number of treatment options, and is a complaint that carries with it a high risk of complications and morbidity.  The differential diagnosis includes sepsis, infection, electrolyte abn   Co morbidities that complicate the patient evaluation  depression, anxiety, GERD, IBS, skin cancer, HTN, CVA, afib (on Eliquis )   Additional history obtained:  Additional history obtained from epic chart review External records from outside source obtained and reviewed including EMS report   Lab Tests:  I Ordered, and personally interpreted labs.  The pertinent results include:  cbc with wbc elevated at 15.5, hgb elevated at 17.3 (likely hemoconcentration); cmp nl; inr 1.3; lactic nl   Imaging Studies ordered:  I ordered imaging studies including cxr  I independently visualized and interpreted imaging which showed   No acute cardiopulmonary findings.  2. Tiny nodule in the right upper lobe may be related to the bony  anatomy, but underlying pulmonary nodule could have this appearance.  No pulmonary nodule seen in this region on chest CT of 04/26/2023.  CT chest without contrast recommended to further evaluate.   I agree with the radiologist  interpretation   Cardiac Monitoring:  The patient was maintained on a cardiac monitor.  I personally viewed and interpreted the cardiac  monitored which showed an underlying rhythm of: st   Medicines ordered and prescription drug management:  I ordered medication including ivfs/tylenol /flagyl /maxipime /vancomycin   for sx  Reevaluation of the patient after these medicines showed that the patient improved I have reviewed the patients home medicines and have made adjustments as needed   Test Considered:  ct   Critical Interventions:  Ivfs/iv abx   Consultations Obtained:  I requested consultation with the hospitalists (Dr. Willette),  and discussed lab and imaging findings as well as pertinent plan - he will admit   Problem List / ED Course:  Sepsis:  likely from urine.  Pt started on flagyl , maxipime , and vancomycin .  Urine cx sensitive to ceftriaxone  and with no other source, abx can likely be transitioned to that.   Pt given IVFs.  Lactic is ok. Urinary retention: FC placed   Reevaluation:  After the interventions noted above, I reevaluated the patient and found that they have :improved   Social Determinants of Health:  Lives in SNF   Dispostion:  After consideration of the diagnostic results and the patients response to treatment, I feel that the patent would benefit from admission.  CRITICAL CARE Performed by: Mliss Boyers   Total critical care time: 45 minutes  Critical care time was exclusive of separately billable procedures and treating other patients.  Critical care was necessary to treat or prevent imminent or life-threatening deterioration.  Critical care was time spent personally by me on the following activities: development of treatment plan with patient and/or surrogate as well as nursing, discussions with consultants, evaluation of patient's response to treatment, examination of patient, obtaining history from patient or surrogate, ordering  and performing treatments and interventions, ordering and review of laboratory studies, ordering and review of radiographic studies, pulse oximetry and re-evaluation of patient's condition.        Final diagnoses:  Sepsis with encephalopathy without septic shock, due to unspecified organism [facility])  Urinary tract infection associated with indwelling urethral catheter, initial encounter [facility])  Gross hematuria  Urinary retention    ED Discharge Orders     None          Boyers Mliss, MD 05/12/24 1539

## 2024-05-12 NOTE — Assessment & Plan Note (Signed)
-  Urosepsis due to chronic Foley catheter -POA met sepsis criteria with leukocytosis, fever, source of infection urine with a chronic indwelling catheter -Initiated on broad-spectrum antibiotics vanc//Flagyl /cefepime  -Blood and urine cultures were obtained in ED   -UA: Positive for leukocyte esterase, negative for nitrite WBC>50 Source of infection UTI/pyelonephritis -Urine from the facility was reviewed greater than 100 K colonies does not specify the bacteria multiresistant, sensitive to cefepime , Rocephin  -Sepsis protocol initiated-continue with IV fluid resuscitation, -Will continue with IV antibiotics of Rocephin  -

## 2024-05-12 NOTE — Hospital Course (Addendum)
 Mason [name] is a 78 yo male with pmhx significant for depression, anxiety, GERD, IBS, skin cancer, HTN, CVA, afib (on Eliquis ).  Pt resides at [facility] due to his CVA -hemiplegia, chronic right-sided weakness. The facility suspected UTI due to dysuria sent a urine sample for cx on 9/16.  It came back today growing over > 100 K colony forming units.  It did not specify which bacteria.    Picture of cx results in media section as it's MDRO.   He apparently has an indwelling FC, but pulled it out today.  Per ED report patient was febrile, and started vomiting today. Poor historian very few words  ED Course: Blood pressure 113/66, pulse 89, temperature (!) 103.3 F (39.6 C), temperature source Rectal, resp. rate 19, weight 71.7 kg, SpO2 98%. LABs: WBC; 15.5, platelets of 522, lactic acid 1.2, UA: Hazy, large Hgb, large leukocyte esterase, 30 protein, bacteria rare, WBC >50 CXR-no acute cardiopulmonary process, questionable small nodule  Patient was noted for sepsis:  likely from urine.  Treated with IV flagyl , maxipime , and vancomycin .    No other source was identified - Urinary retention: Foley catheter was placed

## 2024-05-12 NOTE — Assessment & Plan Note (Signed)
-   In normal sinus rhythm, continue Eliquis , -No rate control medications such as beta-blocker noted on the med list

## 2024-05-12 NOTE — ED Triage Notes (Signed)
 Pt arrives from [facility] facility where staff report that patient has been vomiting, oral zofran  attempted but pt vomited this up, EMS gave 4 mg zofran  via 20g they placed in left FA. Staff also report that patient had a catheter that he pulled out last night but that he has been voiding since. Urine sample was sent on patient on the 16th and came back today as positive for UTI. Other VS with EMS 89% on RA, placed on 2L [place]. 00.1 temp, HR 120s hx of Afib. Also has history of a stroke with Right sided weakness. Pt brief is wet upon arrival, blood noted in brief.

## 2024-05-12 NOTE — Assessment & Plan Note (Signed)
 POA: Met sepsis criteria with Temp.(!) 103.3 F (39.6 C), HR 110,,   WBC; 15.5, platelets of 522, lactic acid 1.2, UA: Hazy, large hemoglobin, with leukocyte esterase, WBC > 50  Patient was noted for sepsis:  likely from urine.  In ED received IV flagyl , maxipime , and vancomycin .     - Urine report from facility was reviewed, > 100 K colony of unspecified bacteria, multiresistant but sensitive to cefepime , Rocephin   Per sepsis protocol will continue with IV fluid resuscitation -Will follow-up with urine/blood cultures >>  - Will continue with IV Rocephin  2 g daily  No other source was identified - Urinary retention: FC placed -Currently hemodynamically stable: Satting 98% on 2 L of oxygen,

## 2024-05-12 NOTE — Assessment & Plan Note (Signed)
-   Hematologic with debility, restricted mobility, urinary retention, -Once stable reevaluating PT OT -Continuing home medication of baclofen , Eliquis , as needed Robaxin  -Change position in bed every 2 hours to avoid pressure ulcers

## 2024-05-12 NOTE — ED Notes (Signed)
Xray/Lab at bedside.

## 2024-05-12 NOTE — Assessment & Plan Note (Signed)
 Continue PPI.

## 2024-05-13 DIAGNOSIS — N400 Enlarged prostate without lower urinary tract symptoms: Secondary | ICD-10-CM | POA: Diagnosis present

## 2024-05-13 DIAGNOSIS — G9341 Metabolic encephalopathy: Secondary | ICD-10-CM | POA: Diagnosis present

## 2024-05-13 DIAGNOSIS — A4150 Gram-negative sepsis, unspecified: Secondary | ICD-10-CM | POA: Diagnosis not present

## 2024-05-13 DIAGNOSIS — Z888 Allergy status to other drugs, medicaments and biological substances status: Secondary | ICD-10-CM | POA: Diagnosis not present

## 2024-05-13 DIAGNOSIS — A419 Sepsis, unspecified organism: Secondary | ICD-10-CM | POA: Diagnosis not present

## 2024-05-13 DIAGNOSIS — N390 Urinary tract infection, site not specified: Secondary | ICD-10-CM | POA: Diagnosis not present

## 2024-05-13 DIAGNOSIS — A4159 Other Gram-negative sepsis: Secondary | ICD-10-CM | POA: Diagnosis present

## 2024-05-13 DIAGNOSIS — Z91013 Allergy to seafood: Secondary | ICD-10-CM | POA: Diagnosis not present

## 2024-05-13 DIAGNOSIS — I10 Essential (primary) hypertension: Secondary | ICD-10-CM | POA: Diagnosis present

## 2024-05-13 DIAGNOSIS — Z66 Do not resuscitate: Secondary | ICD-10-CM | POA: Diagnosis present

## 2024-05-13 DIAGNOSIS — F32A Depression, unspecified: Secondary | ICD-10-CM | POA: Diagnosis present

## 2024-05-13 DIAGNOSIS — I69251 Hemiplegia and hemiparesis following other nontraumatic intracranial hemorrhage affecting right dominant side: Secondary | ICD-10-CM | POA: Diagnosis not present

## 2024-05-13 DIAGNOSIS — Z8249 Family history of ischemic heart disease and other diseases of the circulatory system: Secondary | ICD-10-CM | POA: Diagnosis not present

## 2024-05-13 DIAGNOSIS — R7881 Bacteremia: Secondary | ICD-10-CM | POA: Diagnosis not present

## 2024-05-13 DIAGNOSIS — R1110 Vomiting, unspecified: Secondary | ICD-10-CM | POA: Diagnosis present

## 2024-05-13 DIAGNOSIS — I959 Hypotension, unspecified: Secondary | ICD-10-CM | POA: Diagnosis not present

## 2024-05-13 DIAGNOSIS — K219 Gastro-esophageal reflux disease without esophagitis: Secondary | ICD-10-CM | POA: Diagnosis present

## 2024-05-13 DIAGNOSIS — Z79899 Other long term (current) drug therapy: Secondary | ICD-10-CM | POA: Diagnosis not present

## 2024-05-13 DIAGNOSIS — Z1624 Resistance to multiple antibiotics: Secondary | ICD-10-CM | POA: Diagnosis present

## 2024-05-13 DIAGNOSIS — Z7901 Long term (current) use of anticoagulants: Secondary | ICD-10-CM | POA: Diagnosis not present

## 2024-05-13 DIAGNOSIS — F411 Generalized anxiety disorder: Secondary | ICD-10-CM | POA: Diagnosis present

## 2024-05-13 DIAGNOSIS — Z87891 Personal history of nicotine dependence: Secondary | ICD-10-CM | POA: Diagnosis not present

## 2024-05-13 DIAGNOSIS — R339 Retention of urine, unspecified: Secondary | ICD-10-CM | POA: Diagnosis present

## 2024-05-13 DIAGNOSIS — N12 Tubulo-interstitial nephritis, not specified as acute or chronic: Secondary | ICD-10-CM | POA: Diagnosis present

## 2024-05-13 DIAGNOSIS — Y846 Urinary catheterization as the cause of abnormal reaction of the patient, or of later complication, without mention of misadventure at the time of the procedure: Secondary | ICD-10-CM | POA: Diagnosis present

## 2024-05-13 DIAGNOSIS — R310 Gross hematuria: Secondary | ICD-10-CM | POA: Diagnosis present

## 2024-05-13 DIAGNOSIS — I480 Paroxysmal atrial fibrillation: Secondary | ICD-10-CM | POA: Diagnosis present

## 2024-05-13 DIAGNOSIS — Z85828 Personal history of other malignant neoplasm of skin: Secondary | ICD-10-CM | POA: Diagnosis not present

## 2024-05-13 DIAGNOSIS — T83511A Infection and inflammatory reaction due to indwelling urethral catheter, initial encounter: Secondary | ICD-10-CM | POA: Diagnosis present

## 2024-05-13 DIAGNOSIS — R6520 Severe sepsis without septic shock: Secondary | ICD-10-CM | POA: Diagnosis present

## 2024-05-13 DIAGNOSIS — Z880 Allergy status to penicillin: Secondary | ICD-10-CM | POA: Diagnosis not present

## 2024-05-13 LAB — BLOOD CULTURE ID PANEL (REFLEXED) - BCID2

## 2024-05-13 LAB — CBC
HCT: 48.5 % (ref 39.0–52.0)
Hemoglobin: 15.2 g/dL (ref 13.0–17.0)
MCH: 26.7 pg (ref 26.0–34.0)
MCHC: 31.3 g/dL (ref 30.0–36.0)
MCV: 85.2 fL (ref 80.0–100.0)
Platelets: 462 K/uL — ABNORMAL HIGH (ref 150–400)
RBC: 5.69 MIL/uL (ref 4.22–5.81)
RDW: 17.4 % — ABNORMAL HIGH (ref 11.5–15.5)
WBC: 19 K/uL — ABNORMAL HIGH (ref 4.0–10.5)
nRBC: 0 % (ref 0.0–0.2)

## 2024-05-13 LAB — COMPREHENSIVE METABOLIC PANEL WITH GFR
ALT: 23 U/L (ref 0–44)
AST: 31 U/L (ref 15–41)
Albumin: 2.6 g/dL — ABNORMAL LOW (ref 3.5–5.0)
Alkaline Phosphatase: 69 U/L (ref 38–126)
Anion gap: 7 (ref 5–15)
BUN: 18 mg/dL (ref 8–23)
CO2: 26 mmol/L (ref 22–32)
Calcium: 7.9 mg/dL — ABNORMAL LOW (ref 8.9–10.3)
Chloride: 105 mmol/L (ref 98–111)
Creatinine, Ser: 1.17 mg/dL (ref 0.61–1.24)
GFR, Estimated: >60 mL/min (ref >60)
Glucose, Bld: 99 mg/dL (ref 70–99)
Potassium: 4.6 mmol/L (ref 3.5–5.1)
Sodium: 138 mmol/L (ref 135–145)
Total Bilirubin: 0.8 mg/dL (ref 0.0–1.2)
Total Protein: 6.1 g/dL — ABNORMAL LOW (ref 6.5–8.1)

## 2024-05-13 LAB — URINE CULTURE: Culture: <10000 — AB

## 2024-05-13 LAB — GLUCOSE, CAPILLARY: Glucose-Capillary: 94 mg/dL (ref 70–99)

## 2024-05-13 MED ORDER — LACTATED RINGERS IV BOLUS
500.0000 mL | Freq: Once | INTRAVENOUS | Status: AC
  Administered 2024-05-13: 500 mL via INTRAVENOUS

## 2024-05-13 MED ORDER — LACTATED RINGERS IV SOLN: INTRAVENOUS | Status: AC

## 2024-05-13 MED ORDER — NYSTATIN 100000 UNIT/GM EX OINT
1.0000 | TOPICAL_OINTMENT | Freq: Two times a day (BID) | CUTANEOUS | Status: DC | PRN
  Filled 2024-05-13: qty 15

## 2024-05-13 MED ORDER — SENNOSIDES-DOCUSATE SODIUM 8.6-50 MG PO TABS
1.0000 | ORAL_TABLET | Freq: Every day | ORAL | Status: DC
  Administered 2024-05-15 – 2024-05-16 (×2): 1 via ORAL
  Filled 2024-05-13 (×2): qty 1

## 2024-05-13 MED ORDER — PREGABALIN 25 MG PO CAPS
25.0000 mg | ORAL_CAPSULE | Freq: Two times a day (BID) | ORAL | Status: DC
  Administered 2024-05-13 – 2024-05-16 (×7): 25 mg via ORAL
  Filled 2024-05-13 (×7): qty 1

## 2024-05-13 MED ORDER — MAGNESIUM SULFATE 2 GM/50ML IV SOLN
2.0000 g | Freq: Once | INTRAVENOUS | Status: AC
  Administered 2024-05-13: 2 g via INTRAVENOUS
  Filled 2024-05-13: qty 50

## 2024-05-13 NOTE — Plan of Care (Signed)
  Problem: Acute Rehab PT Goals(only PT should resolve) Goal: Pt will Roll Supine to Side Outcome: Progressing Flowsheets (Taken 05/13/2024 1407) Pt will Roll Supine to Side: with mod assist Goal: Pt Will Go Supine/Side To Sit Outcome: Progressing Flowsheets (Taken 05/13/2024 1407) Pt will go Supine/Side to Sit: with moderate assist Goal: Pt Will Go Sit To Supine/Side Outcome: Progressing Flowsheets (Taken 05/13/2024 1407) Pt will go Sit to Supine/Side: with moderate assist Goal: Patient Will Transfer Sit To/From Stand Outcome: Progressing Flowsheets (Taken 05/13/2024 1407) Patient will transfer sit to/from stand: with moderate assist Goal: Pt Will Transfer Bed To Chair/Chair To Bed Outcome: Progressing Flowsheets (Taken 05/13/2024 1407) Pt will Transfer Bed to Chair/Chair to Bed: with modified independence   2:07 PM, 05/13/24 Lynwood Music, MPT Physical Therapist with [facility] [phone] office [phone] mobile phone

## 2024-05-13 NOTE — Progress Notes (Signed)
 Date and time results received: 05/13/24 0944 (use smartphrase .now to insert current time)  Test: Blood cultures Critical Value: 2 aerobic bottles positive for gram negative rods  Name of Provider Notified: Dr. Willette  Orders Received? Or Actions Taken?: MD and primary nurse made aware.

## 2024-05-13 NOTE — TOC Initial Note (Addendum)
 Transition of Care [facility]) - Initial/Assessment Note    Patient Details  Name: Mason [name] MRN: [id number] Date of Birth: [birth year]  Transition of [facility] CM/SW Contact:    Lucie Lunger, LCSWA Phone Number: 05/13/2024, 10:51 AM  Clinical Narrative:                 CSW notes per chart review that pt arrived to hospital from [facility] CSW spoke to Ozell with facility who states pt is Medicaid pending at this time and can return once medically stable. PT/OT eval will be ordered, auth will be started after therapy evals completed. TOC to follow.    Expected Discharge Plan: Skilled Nursing Facility Barriers to Discharge: Continued Medical Work up   Patient Goals and CMS Choice Patient states their goals for this hospitalization and ongoing recovery are:: return to [facility] [email or web address] Compare Post Acute Care list provided to:: Patient Choice offered to / list presented to : Patient      Expected Discharge Plan and Services In-house Referral: Clinical Social Work Discharge Planning Services: CM Consult Post Acute Care Choice: Nursing Home Living arrangements for the past 2 months: Skilled Nursing Facility                                      Prior Living Arrangements/Services Living arrangements for the past 2 months: Skilled Nursing Facility Lives with:: Facility Resident Patient language and need for interpreter reviewed:: Yes Do you feel safe going back to the place where you live?: Yes      Need for Family Participation in Patient Care: Yes (Comment) Care giver support system in place?: Yes (comment)   Criminal Activity/Legal Involvement Pertinent to Current Situation/Hospitalization: No - Comment as needed  Activities of Daily Living   ADL Screening (condition at time of admission) Independently performs ADLs?: No Does the patient have a NEW difficulty with bathing/dressing/toileting/self-feeding that is expected to last >3  days?: No Does the patient have a NEW difficulty with getting in/out of bed, walking, or climbing stairs that is expected to last >3 days?: No Does the patient have a NEW difficulty with communication that is expected to last >3 days?: No Is the patient deaf or have difficulty hearing?: Yes Does the patient have difficulty seeing, even when wearing glasses/contacts?: Yes Does the patient have difficulty concentrating, remembering, or making decisions?: Yes  Permission Sought/Granted                  Emotional Assessment Appearance:: Appears stated age       Alcohol / Substance Use: Not Applicable Psych Involvement: No (comment)  Admission diagnosis:  Urinary retention [R33.9] Gross hematuria [R31.0] Sepsis (HCC) [A41.9] Urinary tract infection associated with indwelling urethral catheter, initial encounter (HCC) [U16.488J, N39.0] Sepsis with encephalopathy without septic shock, due to unspecified organism (HCC) [A41.9, R65.20, G93.41] Patient Active Problem List   Diagnosis Date Noted   Essential hypertension 05/12/2024   History of urinary retention 10/30/2023   History of elevated PSA 10/30/2023   Weakness of right side of body 06/05/2023   History of hemorrhagic stroke with residual hemiplegia (HCC) 05/30/2023   Glaucoma 05/30/2023   Former smoker 05/30/2023   Ambulatory dysfunction 05/30/2023   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   Atrial fibrillation (HCC) 09/29/2019   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/18/2019   Sepsis (HCC) 09/18/2019   Degenerative disc disease, cervical 01/23/2019  Skin irritation 10/28/2018   Low HDL (under 40) 05/24/2016   BPH (benign prostatic hyperplasia) 12/10/2013   Erectile dysfunction 08/26/2013   GERD (gastroesophageal reflux disease) 06/28/2013   Depression 04/01/2011   Anxiety state 12/15/2007   PEPTIC STRICTURE 12/15/2007   Irritable bowel syndrome 12/23/2006   PCP:  Cleatus Arlyss RAMAN, MD Pharmacy:   [facility] - [place],  [place] - [address] [place] [address] Phone: [phone] Fax: [phone]     Social Drivers of Health (SDOH) Social History: SDOH Screenings   Food Insecurity: No Food Insecurity (05/12/2024)  Housing: Unknown (05/12/2024)  Transportation Needs: No Transportation Needs (05/12/2024)  Utilities: Not At Risk (05/12/2024)  Alcohol Screen: Low Risk  (07/08/2021)  Depression (PHQ2-9): Low Risk  (04/14/2023)  Financial Resource Strain: Low Risk  (07/20/2022)  Physical Activity: Insufficiently Active (07/20/2022)  Social Connections: Unknown (05/12/2024)  Stress: No Stress Concern Present (07/20/2022)  Tobacco Use: Medium Risk (05/12/2024)   SDOH Interventions:     Readmission Risk Interventions     No data to display

## 2024-05-13 NOTE — Progress Notes (Addendum)
 PROGRESS NOTE    Patient: Mason [name]                            PCP: Cleatus Arlyss RAMAN, MD                    DOB: 01-Oct-1945            DOA: 05/12/2024 FMW:[id number]             DOS: 05/13/2024, 12:09 PM   LOS: 0 days   Date of Service: The patient was seen and examined on 05/13/2024  Subjective:    The patient was seen and examined this morning, much more, following command, communicating Hypotensive this morning, BP 88/50, Tmax this morning 101.2, currently 98.5 Elevated WBC to 19.0 S/p IVF LR bolus early this morning   Brief Narrative:   FELDER LEBEDA is a 78 yo male with pmhx significant for depression, anxiety, GERD, IBS, skin cancer, HTN, CVA, afib (on Eliquis ).  Pt resides at [facility] due to his CVA -hemiplegia, chronic right-sided weakness. The facility suspected UTI due to dysuria sent a urine sample for cx on 9/16.  It came back today growing over > 100 K colony forming units.  It did not specify which bacteria.    Picture of cx results in media section as it's MDRO.   He apparently has an indwelling FC, but pulled it out today.  Per ED report patient was febrile, and started vomiting today. Poor historian very few words  ED Course: Blood pressure 113/66, pulse 89, temperature (!) 103.3 F (39.6 C), temperature source Rectal, resp. rate 19, weight 71.7 kg, SpO2 98%. LABs: WBC; 15.5, platelets of 522, lactic acid 1.2, UA: Hazy, large Hgb, large leukocyte esterase, 30 protein, bacteria rare, WBC >50 CXR-no acute cardiopulmonary process, questionable small nodule  Patient was noted for sepsis:  likely from urine.  Treated with IV flagyl , maxipime , and vancomycin .    No other source was identified - Urinary retention: Foley catheter was placed    Assessment & Plan:   Principal Problem:   Sepsis (HCC) Active Problems:   Atrial fibrillation (HCC)   Ambulatory dysfunction   Anxiety state   BPH (benign prostatic hyperplasia)   History of urinary  retention   GERD (gastroesophageal reflux disease)   Glaucoma   Irritable bowel syndrome   Depression   Stroke (cerebrum) (HCC)   History of hemorrhagic stroke with residual hemiplegia (HCC)   Essential hypertension     Assessment and Plan: * Sepsis (HCC) -due to UTI -Urosepsis due to chronic Foley catheter  POA: Met sepsis criteria with Temp.(!) 103.3 F (39.6 C), HR 110,,   WBC; 15.5, platelets of 522, lactic acid 1.2, UA: Hazy, large hemoglobin, with leukocyte esterase, WBC > 50  Still meeting sepsis criteria, temp 101.2, hypotensive BP 88/50, WBC 19.0 - Continuing with IV fluid resuscitation, continue current antibiotics monitoring cultures    POA  In ED received IV flagyl , maxipime , and vancomycin .... Since source of infection thought to be urine, on Rocephin  2 g daily will continue for now   - Urine report from facility was reviewed, > 100 K colony Proteus, multiresistant but sensitive to cefepime , Rocephin    -Will follow-up with urine/blood cultures >>  - Will continue with IV Rocephin  2 g daily   Ambulatory dysfunction -Hemiplegic with chronic right-sided weakness - fall precautions, PT OT when stable  Atrial fibrillation (HCC) - In  normal sinus rhythm, continue Eliquis , -No rate control medications such as beta-blocker noted on the med list   History of urinary retention - Noted neurogenic bladder-chronic Foley catheter, was replaced today in ED 05/12/2024  BPH (benign prostatic hyperplasia) - With history of neurogenic bladder, urinary retention, -Continue home medication of Flomax   Anxiety state - Currently stable, continue home medication trazodone , as needed benzodiazepines  Glaucoma Continue home eyedrops  GERD (gastroesophageal reflux disease) Continue PPI   Essential hypertension - Monitor for hypotension due to soft sepsis -Holding BP meds due to hypotension -Home medication of of Norvasc , losartan --will restart cautiously   History  of hemorrhagic stroke with residual hemiplegia (HCC) -Dense chronic right-sided weakness - Hematologic with debility, restricted mobility, urinary retention, -Once stable reevaluating PT OT -Continuing home medication of baclofen , Eliquis , as needed Robaxin  -Change position in bed every 2 hours to avoid pressure ulcers  Irritable bowel syndrome - As needed bowel regimen  ----------------------------------------------------------------------------- Nutritional status:  The patient's BMI is: Body mass index is 24.64 kg/m. I agree with the assessment and plan as outlined     Cultures; Blood Cultures x 2 >> Urine Culture  >>>     ----------------------------------------------------------------------------- DVT prophylaxis:  apixaban  (ELIQUIS ) tablet 2.5 mg Start: 05/12/24 2200 SCDs Start: 05/12/24 1538 apixaban  (ELIQUIS ) tablet 2.5 mg   Code Status:   Code Status: Do not attempt resuscitation (DNR) PRE-ARREST INTERVENTIONS DESIRED  Family Communication: No family member present at bedside-  -Advance care planning has been discussed.   Admission status:   Status is: Inpatient Remains inpatient appropriate because: Needing monitoring in the stepdown unit for treatment of sepsis, hypotension, needing IV antibiotics, IV fluids   Disposition: From  - SNF             Planning for discharge in 2-3  days back to SNF  Procedures:   No admission procedures for hospital encounter.   Antimicrobials:  Anti-infectives (From admission, onward)    Start     Dose/Rate Route Frequency Ordered Stop   05/12/24 2200  cefTRIAXone  (ROCEPHIN ) 2 g in sodium chloride  0.9 % 100 mL IVPB        2 g 200 mL/hr over 30 Minutes Intravenous Every 24 hours 05/12/24 1541 05/19/24 2159   05/12/24 1300  vancomycin  (VANCOREADY) IVPB 1500 mg/300 mL        1,500 mg 150 mL/hr over 120 Minutes Intravenous  Once 05/12/24 1231 05/12/24 1708   05/12/24 1230  ceFEPIme  (MAXIPIME ) 2 g in sodium chloride  0.9 %  100 mL IVPB        2 g 200 mL/hr over 30 Minutes Intravenous  Once 05/12/24 1225 05/12/24 1520   05/12/24 1230  metroNIDAZOLE  (FLAGYL ) IVPB 500 mg        500 mg 100 mL/hr over 60 Minutes Intravenous  Once 05/12/24 1225 05/12/24 1520   05/12/24 1230  vancomycin  (VANCOCIN ) IVPB 1000 mg/200 mL premix  Status:  Discontinued        1,000 mg 200 mL/hr over 60 Minutes Intravenous  Once 05/12/24 1225 05/12/24 1231        Medication:   amLODipine   2.5 mg Oral Daily   apixaban   2.5 mg Oral BID   baclofen   5 mg Oral TID   Chlorhexidine  Gluconate Cloth  6 each Topical Q0600   famotidine   20 mg Oral Daily   hydrocortisone   1 Application Rectal BID   latanoprost   1 drop Both Eyes QHS   losartan   25 mg Oral Daily  melatonin  5 mg Oral QHS   polyethylene glycol  17 g Oral Daily   pregabalin   50 mg Oral BID   sodium chloride  flush  3 mL Intravenous Q12H   sodium chloride  flush  3 mL Intravenous Q12H   tamsulosin   0.4 mg Oral QPC supper   timolol   1 drop Both Eyes Daily    acetaminophen  **OR** acetaminophen , bisacodyl , hydrALAZINE , HYDROmorphone  (DILAUDID ) injection, ipratropium, levalbuterol , methocarbamol , ondansetron  **OR** ondansetron  (ZOFRAN ) IV, oxyCODONE , senna-docusate, traZODone    Objective:   Vitals:   05/13/24 0900 05/13/24 0926 05/13/24 1000 05/13/24 1100  BP: (!) 105/59 (!) 90/48 (!) 88/50 115/60  Pulse: 82 78 70 66  Resp: 20  16 18   Temp:  98.5 F (36.9 C)    TempSrc:  Oral    SpO2: 96% 93% 95% 100%  Weight:      Height:        Intake/Output Summary (Last 24 hours) at 05/13/2024 1209 Last data filed at 05/13/2024 0600 Gross per 24 hour  Intake 5659.67 ml  Output 850 ml  Net 4809.67 ml   Filed Weights   05/12/24 1230 05/12/24 1827 05/13/24 0434  Weight: 71.7 kg 77.9 kg 77.9 kg     Physical examination:   General:  AAO x 2,  cooperative, no distress;   HEENT:  Normocephalic, PERRL, otherwise with in Normal limits   Neuro:  Hemiplegic, with dense right-sided  weakness,  Motor and sensation intact on the left CNII-XII intact. reflexes intact   Lungs:   Clear to auscultation BL, Respirations unlabored,  No wheezes / crackles  Cardio:    S1/S2, RRR, No murmure, No Rubs or Gallops   Abdomen:  Soft, non-tender, bowel sounds active all four quadrants, no guarding or peritoneal signs.  Muscular  skeletal:  Dense right-sided weakness  Limited exam -global generalized weaknesses - in bed, able to move all 4 extremities,   2+ pulses,  symmetric, No pitting edema  Skin:  Dry, warm to touch, negative for any Rashes,  Wounds: Please see nursing documentation       ------------------------------------------------------------------------------------------------------------------------------------------    LABs:     Latest Ref Rng & Units 05/13/2024    3:36 AM 05/12/2024    1:09 PM 05/05/2023    7:05 AM  CBC  WBC 4.0 - 10.5 K/uL 19.0  15.5  15.1   Hemoglobin 13.0 - 17.0 g/dL 84.7  82.6  84.6   Hematocrit 39.0 - 52.0 % 48.5  54.3  47.1   Platelets 150 - 400 K/uL 462  522  458       Latest Ref Rng & Units 05/13/2024    3:36 AM 05/12/2024    1:09 PM 05/05/2023    7:05 AM  CMP  Glucose 70 - 99 mg/dL 99  88  96   BUN 8 - 23 mg/dL 18  20  32   Creatinine 0.61 - 1.24 mg/dL 8.82  8.91  8.94   Sodium 135 - 145 mmol/L 138  137  134   Potassium 3.5 - 5.1 mmol/L 4.6  4.1  3.8   Chloride 98 - 111 mmol/L 105  103  101   CO2 22 - 32 mmol/L 26  23  24    Calcium 8.9 - 10.3 mg/dL 7.9  8.8  8.1   Total Protein 6.5 - 8.1 g/dL 6.1  7.1    Total Bilirubin 0.0 - 1.2 mg/dL 0.8  0.9    Alkaline Phos 38 - 126 U/L 69  110  AST 15 - 41 U/L 31  21    ALT 0 - 44 U/L 23  15         Micro Results Recent Results (from the past 240 hours)  Resp panel by RT-PCR (RSV, Flu A&B, Covid) Anterior Nasal Swab     Status: None   Collection Time: 05/12/24 12:25 PM   Specimen: Anterior Nasal Swab  Result Value Ref Range Status   SARS Coronavirus 2 by RT PCR NEGATIVE  NEGATIVE Final    Comment: (NOTE) SARS-CoV-2 target nucleic acids are NOT DETECTED.  The SARS-CoV-2 RNA is generally detectable in upper respiratory specimens during the acute phase of infection. The lowest concentration of SARS-CoV-2 viral copies this assay can detect is 138 copies/mL. A negative result does not preclude SARS-Cov-2 infection and should not be used as the sole basis for treatment or other patient management decisions. A negative result may occur with  improper specimen collection/handling, submission of specimen other than nasopharyngeal swab, presence of viral mutation(s) within the areas targeted by this assay, and inadequate number of viral copies(<138 copies/mL). A negative result must be combined with clinical observations, patient history, and epidemiological information. The expected result is Negative.  Fact Sheet for Patients:  BloggerCourse.com  Fact Sheet for Healthcare Providers:  SeriousBroker.it  This test is no t yet approved or cleared by the [place]  FDA and  has been authorized for detection and/or diagnosis of SARS-CoV-2 by FDA under an Emergency Use Authorization (EUA). This EUA will remain  in effect (meaning this test can be used) for the duration of the COVID-19 declaration under Section 564(b)(1) of the Act, 21 U.S.C.section 360bbb-3(b)(1), unless the authorization is terminated  or revoked sooner.       Influenza A by PCR NEGATIVE NEGATIVE Final   Influenza B by PCR NEGATIVE NEGATIVE Final    Comment: (NOTE) The Xpert Xpress SARS-CoV-2/FLU/RSV plus assay is intended as an aid in the diagnosis of influenza from Nasopharyngeal swab specimens and should not be used as a sole basis for treatment. Nasal washings and aspirates are unacceptable for Xpert Xpress SARS-CoV-2/FLU/RSV testing.  Fact Sheet for Patients: BloggerCourse.com  Fact Sheet for Healthcare  Providers: SeriousBroker.it  This test is not yet approved or cleared by the [place]  FDA and has been authorized for detection and/or diagnosis of SARS-CoV-2 by FDA under an Emergency Use Authorization (EUA). This EUA will remain in effect (meaning this test can be used) for the duration of the COVID-19 declaration under Section 564(b)(1) of the Act, 21 U.S.C. section 360bbb-3(b)(1), unless the authorization is terminated or revoked.     Resp Syncytial Virus by PCR NEGATIVE NEGATIVE Final    Comment: (NOTE) Fact Sheet for Patients: BloggerCourse.com  Fact Sheet for Healthcare Providers: SeriousBroker.it  This test is not yet approved or cleared by the [place]  FDA and has been authorized for detection and/or diagnosis of SARS-CoV-2 by FDA under an Emergency Use Authorization (EUA). This EUA will remain in effect (meaning this test can be used) for the duration of the COVID-19 declaration under Section 564(b)(1) of the Act, 21 U.S.C. section 360bbb-3(b)(1), unless the authorization is terminated or revoked.  Performed at [facility], [address], [place], [place] [address]   Blood Culture (routine x 2)     Status: None (Preliminary result)   Collection Time: 05/12/24  1:09 PM   Specimen: BLOOD  Result Value Ref Range Status   Specimen Description BLOOD LEFT ANTECUBITAL  Final   Special Requests  Final    BOTTLES DRAWN AEROBIC AND ANAEROBIC Blood Culture adequate volume   Culture  Setup Time   Final    AEROBIC BOTTLE ONLY GRAM NEGATIVE RODS Gram Stain Report Called to,Read Back By and Verified With: GSABRA HORN RN 05/13/24 @0943  BY J.WHITE Performed at [facility], [address], [place], [place] [address]    Culture GRAM NEGATIVE RODS  Final   Report Status PENDING  Incomplete  Blood Culture (routine x 2)     Status: None (Preliminary result)   Collection Time: 05/12/24  1:09 PM    Specimen: BLOOD  Result Value Ref Range Status   Specimen Description BLOOD BLOOD RIGHT HAND  Final   Special Requests   Final    BOTTLES DRAWN AEROBIC AND ANAEROBIC Blood Culture adequate volume   Culture  Setup Time   Final    AEROBIC BOTTLE ONLY GRAM NEGATIVE RODS Gram Stain Report Called to,Read Back By and Verified With: G ROOS RN 05/13/24 @0942  BY J. WHITE Performed at [facility], [address] [place], [place] [address]    Culture GRAM NEGATIVE RODS  Final   Report Status PENDING  Incomplete  MRSA Next Gen by PCR, Nasal     Status: None   Collection Time: 05/12/24  6:25 PM   Specimen: Nasal Mucosa; Nasal Swab  Result Value Ref Range Status   MRSA by PCR Next Gen NOT DETECTED NOT DETECTED Final    Comment: (NOTE) The GeneXpert MRSA Assay (FDA approved for NASAL specimens only), is one component of a comprehensive MRSA colonization surveillance program. It is not intended to diagnose MRSA infection nor to guide or monitor treatment for MRSA infections. Test performance is not FDA approved in patients less than 50 years old. Performed at [facility], [address] [place], [place] [address]     Radiology Reports DG Chest [place] 1 View Result Date: 05/12/2024 CLINICAL DATA:  Sepsis. EXAM: PORTABLE CHEST 1 VIEW COMPARISON:  05/02/2023 FINDINGS: The lungs are clear without focal pneumonia, edema, pneumothorax or pleural effusion. Tiny nodule is seen immediately adjacent to the into the anterior right first rib, potentially related to the bony anatomy, but underlying right upper lobe pulmonary nodule could have this appearance. The cardiopericardial silhouette is within normal limits for size. No acute bony abnormality. Telemetry leads overlie the chest. IMPRESSION: 1. No acute cardiopulmonary findings. 2. Tiny nodule in the right upper lobe may be related to the bony anatomy, but underlying pulmonary nodule could have this appearance. No pulmonary nodule seen in this region on chest  CT of 04/26/2023. CT chest without contrast recommended to further evaluate. Electronically Signed   By: Camellia Candle M.D.   On: 05/12/2024 13:33    SIGNED: Adriana DELENA Grams, MD, FHM. FAAFP. Jolynn Pack - [facility] Critical care time spent - 55 min.  In seeing, evaluating and examining the patient. Reviewing medical records, labs, drawn plan of care. [facility],  Pager (please use [email or web address] to page/ text) Please use Epic Secure Chat for non-urgent communication (7AM-7PM)  If 7PM-7AM, please contact night-coverage [email or web address], 05/13/2024, 12:09 PM

## 2024-05-13 NOTE — Progress Notes (Signed)
   05/13/24 0023  Provider Notification  Provider Name/Title Dr. Manfred  Date Provider Notified 05/13/24  Time Provider Notified 0025  Method of Notification Page  Notification Reason Other (Comment) (BP is 80/47)  Provider response See new orders

## 2024-05-13 NOTE — Evaluation (Signed)
 Occupational Therapy Evaluation Patient Details Name: Mason [name] MRN: [id number] DOB: 1945/11/16 Today's Date: 05/13/2024   History of Present Illness   Mason [name] is a 78 yo male with pmhx significant for depression, anxiety, GERD, IBS, skin cancer, HTN, CVA, afib (on Eliquis ).  Pt resides at [name] due to his CVA -hemiplegia, chronic right-sided weakness. The facility suspected UTI due to dysuria sent a urine sample for cx on 9/16.  It came back today growing over > 100 K colony forming units.  It did not specify which bacteria. (per MD)     Clinical Impressions Pt agreeable to OT and PT co-evaluation. Pt reports being assisted at baseline from SNF staff, but is able to be transferred to the chair from the bed with one to two assist. Pt required max A for bed mobility and transfer to and from chair today. R UE presents without functional use due to deficits from baseline stroke. Max to total assist for most ADL's at this time per observation and clinical judgement. Poor sitting balance noted at the EOB today. Nurse notified of pt's blood pressure drop that is listed below. Pt left in bed with the call bell within reach. Pt will benefit from continued OT in the hospital to increase strength, balance, and endurance for safe ADL's.        If plan is discharge home, recommend the following:   Two people to help with walking and/or transfers;Two people to help with bathing/dressing/bathroom;Assistance with cooking/housework;Assistance with feeding;Direct supervision/assist for medications management;Assist for transportation;Help with stairs or ramp for entrance     Functional Status Assessment   Patient has had a recent decline in their functional status and/or demonstrates limited ability to make significant improvements in function in a reasonable and predictable amount of time     Equipment Recommendations   None recommended by OT              Precautions/Restrictions   Precautions Precautions: Fall Recall of Precautions/Restrictions: Impaired Restrictions Weight Bearing Restrictions Per Provider Order: No     Mobility Bed Mobility Overal bed mobility: Needs Assistance Bed Mobility: Supine to Sit, Sit to Supine     Supine to sit: Max assist, HOB elevated Sit to supine: Max assist   General bed mobility comments: Much assist; assited to move B LE to EOB and back into bed. Poor balance needing assist to bring torso to upright position as well.    Transfers Overall transfer level: Needs assistance   Transfers: Sit to/from Stand, Bed to chair/wheelchair/BSC Sit to Stand: Max assist Stand pivot transfers: Max assist         General transfer comment: EOB to chair and back without AD.      Balance Overall balance assessment: Needs assistance Sitting-balance support: Single extremity supported, Feet supported Sitting balance-Leahy Scale: Poor Sitting balance - Comments: seated at EOB Postural control: Left lateral lean Standing balance support: Single extremity supported, During functional activity Standing balance-Leahy Scale: Poor Standing balance comment: Without AD; therapist assisting.                           ADL either performed or assessed with clinical judgement   ADL Overall ADL's : Needs assistance/impaired Eating/Feeding: Moderate assistance;Sitting   Grooming: Moderate assistance;Maximal assistance;Sitting   Upper Body Bathing: Maximal assistance;Sitting   Lower Body Bathing: Total assistance;Bed level   Upper Body Dressing : Maximal assistance;Sitting   Lower Body Dressing: Total assistance;Bed level  Toilet Transfer: Maximal Cabin crew Details (indicate cue type and reason): EOB to chair Toileting- Clothing Manipulation and Hygiene: Total assistance;Bed level               Vision Baseline Vision/History: 1 Wears glasses Ability to  See in Adequate Light: 1 Impaired Patient Visual Report: No change from baseline Vision Assessment?: No apparent visual deficits Additional Comments: Not formally tested. Pt reported no residual visual deficits from previous stroke.     Perception Perception: Not tested       Praxis Praxis: Not tested       Pertinent Vitals/Pain Pain Assessment Pain Assessment: Faces Faces Pain Scale: Hurts little more Pain Location: R UE. Pain Descriptors / Indicators: Numbness, Sharp Pain Intervention(s): Monitored during session, Repositioned, Limited activity within patient's tolerance     Extremity/Trunk Assessment Upper Extremity Assessment Upper Extremity Assessment: RUE deficits/detail;LUE deficits/detail RUE Deficits / Details: WFL LUE Deficits / Details: Hemiplegia from prior stroke. Trace movement and digits. No functional use noted today. LUE Coordination: decreased fine motor;decreased gross motor   Lower Extremity Assessment Lower Extremity Assessment: Defer to PT evaluation   Cervical / Trunk Assessment Cervical / Trunk Assessment: Kyphotic   Communication Communication Communication: Impaired Factors Affecting Communication: Reduced clarity of speech   Cognition Arousal: Alert Behavior During Therapy: WFL for tasks assessed/performed Cognition: No family/caregiver present to determine baseline             OT - Cognition Comments: Pt struggled to recall prior function at SNF periodically.                 Following commands: Intact       Cueing  General Comments   Cueing Techniques: Verbal cues;Gestural cues;Tactile cues  BP falling to 76/49 after transfer to chair. Pt placed back in bed in supine with BP and 88/54.   Exercises     Shoulder Instructions      Home Living Family/patient expects to be discharged to:: Skilled nursing facility                                        Prior Functioning/Environment Prior Level of  Function : Needs assist;Patient poor historian/Family not available             Mobility Comments: Pt reported EOB to chair transfers with +2 and sometimes 1 assist without AD. ADLs Comments: Assisted by SNF staff.    OT Problem List: Decreased strength;Decreased activity tolerance;Impaired balance (sitting and/or standing);Impaired UE functional use   OT Treatment/Interventions: Self-care/ADL training;Therapeutic exercise;Therapeutic activities;Patient/family education;Balance training      OT Goals(Current goals can be found in the care plan section)   Acute Rehab OT Goals Patient Stated Goal: return to rehab OT Goal Formulation: With patient Time For Goal Achievement: 05/27/24 Potential to Achieve Goals: Good   OT Frequency:  Min 2X/week    Co-evaluation PT/OT/SLP Co-Evaluation/Treatment: Yes Reason for Co-Treatment: To address functional/ADL transfers   OT goals addressed during session: ADL's and self-care      AM-PAC OT 6 Clicks Daily Activity     Outcome Measure Help from another person eating meals?: A Lot Help from another person taking care of personal grooming?: A Lot Help from another person toileting, which includes using toliet, bedpan, or urinal?: Total Help from another person bathing (including washing, rinsing, drying)?: Total Help from another person to put on and taking off  regular upper body clothing?: A Lot Help from another person to put on and taking off regular lower body clothing?: Total 6 Click Score: 9   End of Session Equipment Utilized During Treatment: Gait belt Nurse Communication: Other (comment) (Notified of low blood pressure.)  Activity Tolerance: Patient tolerated treatment well Patient left: in bed;with call bell/phone within reach  OT Visit Diagnosis: Muscle weakness (generalized) (M62.81);Other abnormalities of gait and mobility (R26.89)                Time: 9151-9078 OT Time Calculation (min): 33 min Charges:  OT  General Charges $OT Visit: 1 Visit OT Evaluation $OT Eval Low Complexity: 1 Low  Rianna Lukes OT, MOT  Jayson Person 05/13/2024, 11:50 AM

## 2024-05-13 NOTE — Evaluation (Signed)
 Physical Therapy Evaluation Patient Details Name: Mason [name] MRN: [id number] DOB: 08/12/1946 Today's Date: 05/13/2024  History of Present Illness  Mason [name] is a 78 yo male with pmhx significant for depression, anxiety, GERD, IBS, skin cancer, HTN, CVA, afib (on Eliquis ).  Pt resides at [facility] due to his CVA -hemiplegia, chronic right-sided weakness. The facility suspected UTI due to dysuria sent a urine sample for cx on 9/16.  It came back today growing over > 100 K colony forming units.  It did not specify which bacteria.   Clinical Impression  Patient demonstrates slow labored movement for sitting up at bedside, once seated had frequent posterior leaning, very unsteady on feet and limited to stand pivot on LLE due to weakness and poor standing balance. Patient sat up in chair briefly before having to put back to bed due to high BP - RN aware. Patient will benefit from continued skilled physical therapy in hospital and recommended venue below to increase strength, balance, endurance for safe ADLs and transfers.          If plan is discharge home, recommend the following: A lot of help with bathing/dressing/bathroom;A lot of help with walking and/or transfers;Help with stairs or ramp for entrance;Assist for transportation;Assistance with cooking/housework   Can travel by private vehicle   No    Equipment Recommendations None recommended by PT  Recommendations for Other Services       Functional Status Assessment Patient has had a recent decline in their functional status and/or demonstrates limited ability to make significant improvements in function in a reasonable and predictable amount of time     Precautions / Restrictions Precautions Precautions: Fall Recall of Precautions/Restrictions: Impaired Restrictions Weight Bearing Restrictions Per Provider Order: No      Mobility  Bed Mobility Overal bed mobility: Needs Assistance Bed Mobility: Supine to  Sit, Sit to Supine     Supine to sit: Max assist, HOB elevated Sit to supine: Max assist   General bed mobility comments: unsteady labored movement with frequent leaning backwards once seated    Transfers Overall transfer level: Needs assistance Equipment used: 1 person hand held assist, None Transfers: Sit to/from Stand, Bed to chair/wheelchair/BSC Sit to Stand: Max assist Stand pivot transfers: Max assist         General transfer comment: poor tolerance for standing due to weakness and right sided hemiplegia    Ambulation/Gait                  Stairs            Wheelchair Mobility     Tilt Bed    Modified Rankin (Stroke Patients Only)       Balance Overall balance assessment: Needs assistance Sitting-balance support: Feet supported, No upper extremity supported Sitting balance-Leahy Scale: Poor Sitting balance - Comments: seated at EOB Postural control: Left lateral lean Standing balance support: Single extremity supported, During functional activity Standing balance-Leahy Scale: Poor Standing balance comment: Without AD; therapist assisting                             Pertinent Vitals/Pain Pain Assessment Pain Assessment: Faces Faces Pain Scale: Hurts little more Pain Location: R UE. Pain Descriptors / Indicators: Numbness, Sharp Pain Intervention(s): Limited activity within patient's tolerance, Monitored during session, Repositioned    Home Living Family/patient expects to be discharged to:: Skilled nursing facility  Prior Function Prior Level of Function : Needs assist;Patient poor historian/Family not available             Mobility Comments: Pt reported EOB to chair transfers with +2 and sometimes 1 assist without AD. ADLs Comments: Assisted by SNF staff.     Extremity/Trunk Assessment   Upper Extremity Assessment Upper Extremity Assessment: Defer to OT evaluation RUE Deficits /  Details: WFL LUE Deficits / Details: Hemiplegia from prior stroke. Trace movement and digits. No functional use noted today. LUE Coordination: decreased fine motor;decreased gross motor    Lower Extremity Assessment Lower Extremity Assessment: Generalized weakness;RLE deficits/detail RLE Deficits / Details: grossly -2/5 RLE Sensation: decreased light touch;decreased proprioception RLE Coordination: decreased fine motor;decreased gross motor    Cervical / Trunk Assessment Cervical / Trunk Assessment: Kyphotic  Communication   Communication Communication: Impaired Factors Affecting Communication: Reduced clarity of speech    Cognition Arousal: Alert Behavior During Therapy: WFL for tasks assessed/performed                           PT - Cognition Comments: slow to answer some questions Following commands: Intact       Cueing Cueing Techniques: Verbal cues, Gestural cues, Tactile cues     General Comments General comments (skin integrity, edema, etc.): BP falling to 76/49 after transfer to chair. Pt placed back in bed in supine with BP and 88/54.    Exercises     Assessment/Plan    PT Assessment Patient needs continued PT services  PT Problem List Decreased strength;Decreased activity tolerance;Decreased balance;Decreased range of motion;Decreased coordination;Pain       PT Treatment Interventions DME instruction;Functional mobility training;Therapeutic activities;Therapeutic exercise;Balance training;Patient/family education    PT Goals (Current goals can be found in the Care Plan section)  Acute Rehab PT Goals Patient Stated Goal: return home with SNF staff to assist PT Goal Formulation: With patient Time For Goal Achievement: 05/27/24 Potential to Achieve Goals: Good    Frequency Min 3X/week     Co-evaluation   Reason for Co-Treatment: To address functional/ADL transfers   OT goals addressed during session: ADL's and self-care       AM-PAC  PT 6 Clicks Mobility  Outcome Measure Help needed turning from your back to your side while in a flat bed without using bedrails?: A Lot Help needed moving from lying on your back to sitting on the side of a flat bed without using bedrails?: A Lot Help needed moving to and from a bed to a chair (including a wheelchair)?: A Lot Help needed standing up from a chair using your arms (e.g., wheelchair or bedside chair)?: A Lot Help needed to walk in hospital room?: Total Help needed climbing 3-5 steps with a railing? : Total 6 Click Score: 10    End of Session Equipment Utilized During Treatment: Gait belt Activity Tolerance: Patient tolerated treatment well;Patient limited by fatigue Patient left: in bed;with call bell/phone within reach Nurse Communication: Mobility status PT Visit Diagnosis: Other abnormalities of gait and mobility (R26.89);Unsteadiness on feet (R26.81);Muscle weakness (generalized) (M62.81)    Time: 9152-9081 PT Time Calculation (min) (ACUTE ONLY): 31 min   Charges:   PT Evaluation $PT Eval Moderate Complexity: 1 Mod PT Treatments $Therapeutic Activity: 23-37 mins PT General Charges $$ ACUTE PT VISIT: 1 Visit         2:03 PM, 05/13/24 Lynwood Music, MPT Physical Therapist with [facility] 336 [phone] office [phone] mobile  phone

## 2024-05-13 NOTE — Plan of Care (Signed)
  Problem: Acute Rehab OT Goals (only OT should resolve) Goal: Pt. Will Perform Eating Flowsheets (Taken 05/13/2024 1152) Pt Will Perform Eating:  with set-up  sitting Goal: Pt. Will Perform Grooming Flowsheets (Taken 05/13/2024 1152) Pt Will Perform Grooming:  with min assist  sitting Goal: Pt. Will Perform Upper Body Dressing Flowsheets (Taken 05/13/2024 1152) Pt Will Perform Upper Body Dressing:  with contact guard assist  with min assist  sitting Goal: Pt. Will Transfer To Toilet Flowsheets (Taken 05/13/2024 1152) Pt Will Transfer to Toilet:  with mod assist  stand pivot transfer Goal: Pt/Caregiver Will Perform Home Exercise Program Flowsheets (Taken 05/13/2024 1152) Pt/caregiver will Perform Home Exercise Program:  Increased ROM  Right Upper extremity  With minimal assist  Tracie Lindbloom OT, MOT

## 2024-05-13 NOTE — Progress Notes (Signed)
 PHARMACY - PHYSICIAN COMMUNICATION CRITICAL VALUE ALERT - BLOOD CULTURE IDENTIFICATION (BCID)  Mason [name] is an 78 y.o. male who presented to [facility] on 05/12/2024 with a chief complaint of emesis  Assessment:  78 yo male with pmhx significant for depression, anxiety, GERD, IBS, skin cancer, HTN, CVA, afib (on Eliquis ). Pt resides at [facility] due to his CVA -hemiplegia, chronic right-sided weakness. The facility suspected UTI due to dysuria sent a urine sample for cx on 9/16. UCx + Proteus Mirabilis S- ceftriaxone , now BCx + Proteus Mirabilis with no Resistance  Name of physician (or Provider) Contacted: Dr. Twana  Current antibiotics: Ceftriaxone  2gm IV q24h  Changes to prescribed antibiotics recommended:  Continue Ceftriaxone  2gm IV q24h  Results for orders placed or performed during the hospital encounter of 05/12/24  Blood Culture ID Panel (Reflexed) (Collected: 05/12/2024  1:09 PM)  Result Value Ref Range   Enterococcus faecalis NOT DETECTED NOT DETECTED   Enterococcus Faecium NOT DETECTED NOT DETECTED   Listeria monocytogenes NOT DETECTED NOT DETECTED   Staphylococcus species NOT DETECTED NOT DETECTED   Staphylococcus aureus (BCID) NOT DETECTED NOT DETECTED   Staphylococcus epidermidis NOT DETECTED NOT DETECTED   Staphylococcus lugdunensis NOT DETECTED NOT DETECTED   Streptococcus species NOT DETECTED NOT DETECTED   Streptococcus agalactiae NOT DETECTED NOT DETECTED   Streptococcus pneumoniae NOT DETECTED NOT DETECTED   Streptococcus pyogenes NOT DETECTED NOT DETECTED   A.calcoaceticus-baumannii NOT DETECTED NOT DETECTED   Bacteroides fragilis NOT DETECTED NOT DETECTED   Enterobacterales DETECTED (A) NOT DETECTED   Enterobacter cloacae complex NOT DETECTED NOT DETECTED   Escherichia coli NOT DETECTED NOT DETECTED   Klebsiella aerogenes NOT DETECTED NOT DETECTED   Klebsiella oxytoca NOT DETECTED NOT DETECTED   Klebsiella pneumoniae NOT DETECTED NOT DETECTED    Proteus species DETECTED (A) NOT DETECTED   Salmonella species NOT DETECTED NOT DETECTED   Serratia marcescens NOT DETECTED NOT DETECTED   Haemophilus influenzae NOT DETECTED NOT DETECTED   Neisseria meningitidis NOT DETECTED NOT DETECTED   Pseudomonas aeruginosa NOT DETECTED NOT DETECTED   Stenotrophomonas maltophilia NOT DETECTED NOT DETECTED   Candida albicans NOT DETECTED NOT DETECTED   Candida auris NOT DETECTED NOT DETECTED   Candida glabrata NOT DETECTED NOT DETECTED   Candida krusei NOT DETECTED NOT DETECTED   Candida parapsilosis NOT DETECTED NOT DETECTED   Candida tropicalis NOT DETECTED NOT DETECTED   Cryptococcus neoformans/gattii NOT DETECTED NOT DETECTED   CTX-M ESBL NOT DETECTED NOT DETECTED   Carbapenem resistance IMP NOT DETECTED NOT DETECTED   Carbapenem resistance KPC NOT DETECTED NOT DETECTED   Carbapenem resistance NDM NOT DETECTED NOT DETECTED   Carbapenem resist OXA 48 LIKE NOT DETECTED NOT DETECTED   Carbapenem resistance VIM NOT DETECTED NOT DETECTED    [name] 05/13/2024  2:54 PM

## 2024-05-13 NOTE — Progress Notes (Signed)
   05/12/24 2300  Provider Notification  Provider Name/Title Dr. Manfred  Date Provider Notified 05/12/24  Time Provider Notified 2300  Method of Notification Page  Notification Reason Other (Comment) (Temp 102.2)  Provider response Other (Comment) (Continue to monitor)

## 2024-05-14 DIAGNOSIS — R7881 Bacteremia: Secondary | ICD-10-CM

## 2024-05-14 DIAGNOSIS — N390 Urinary tract infection, site not specified: Secondary | ICD-10-CM | POA: Diagnosis not present

## 2024-05-14 LAB — CBC
HCT: 41.4 % (ref 39.0–52.0)
Hemoglobin: 13.4 g/dL (ref 13.0–17.0)
MCH: 26.8 pg (ref 26.0–34.0)
MCHC: 32.4 g/dL (ref 30.0–36.0)
MCV: 82.8 fL (ref 80.0–100.0)
Platelets: 361 K/uL (ref 150–400)
RBC: 5 MIL/uL (ref 4.22–5.81)
RDW: 17.1 % — ABNORMAL HIGH (ref 11.5–15.5)
WBC: 13.7 K/uL — ABNORMAL HIGH (ref 4.0–10.5)
nRBC: 0 % (ref 0.0–0.2)

## 2024-05-14 LAB — COMPREHENSIVE METABOLIC PANEL WITH GFR
ALT: 50 U/L — ABNORMAL HIGH (ref 0–44)
AST: 53 U/L — ABNORMAL HIGH (ref 15–41)
Albumin: 2.4 g/dL — ABNORMAL LOW (ref 3.5–5.0)
Alkaline Phosphatase: 69 U/L (ref 38–126)
Anion gap: 6 (ref 5–15)
BUN: 15 mg/dL (ref 8–23)
CO2: 24 mmol/L (ref 22–32)
Calcium: 7.7 mg/dL — ABNORMAL LOW (ref 8.9–10.3)
Chloride: 104 mmol/L (ref 98–111)
Creatinine, Ser: 0.77 mg/dL (ref 0.61–1.24)
GFR, Estimated: >60 mL/min (ref >60)
Glucose, Bld: 91 mg/dL (ref 70–99)
Potassium: 3.9 mmol/L (ref 3.5–5.1)
Sodium: 134 mmol/L — ABNORMAL LOW (ref 135–145)
Total Bilirubin: 0.6 mg/dL (ref 0.0–1.2)
Total Protein: 5.4 g/dL — ABNORMAL LOW (ref 6.5–8.1)

## 2024-05-14 MED ORDER — LACTATED RINGERS IV SOLN: INTRAVENOUS | Status: AC

## 2024-05-14 MED ORDER — MELATONIN 3 MG PO TABS
6.0000 mg | ORAL_TABLET | Freq: Every day | ORAL | Status: DC
Start: 2024-05-14 — End: 2024-05-17
  Administered 2024-05-14 – 2024-05-16 (×3): 6 mg via ORAL
  Filled 2024-05-14 (×3): qty 2

## 2024-05-14 NOTE — Progress Notes (Signed)
 Physical Therapy Treatment Patient Details Name: Mason [name] MRN: [id number] DOB: 09-Jul-1946 Today's Date: 05/14/2024   History of Present Illness Mason [name] is a 78 yo male with pmhx significant for depression, anxiety, GERD, IBS, skin cancer, HTN, CVA, afib (on Eliquis ).  Pt resides at [facility] due to his CVA -hemiplegia, chronic right-sided weakness. The facility suspected UTI due to dysuria sent a urine sample for cx on 9/16.  It came back today growing over > 100 K colony forming units.  It did not specify which bacteria.    PT Comments  Patient demonstrates slow labored movement for sitting up at bedside, had most difficulty scooting to EOB due to right sided weakness, able to stand pivot on LLE during transfer to chair and tolerated standing using left hand on RW, right side supported with hand held assist while being cleaned after bowel movement. Patient tolerated sitting up in chair after therapy. Patient will benefit from continued skilled physical therapy in hospital and recommended venue below to increase strength, balance, endurance for safe ADLs and gait.       If plan is discharge home, recommend the following: A lot of help with bathing/dressing/bathroom;A lot of help with walking and/or transfers;Help with stairs or ramp for entrance;Assist for transportation;Assistance with cooking/housework   Can travel by private vehicle     No  Equipment Recommendations  None recommended by PT    Recommendations for Other Services       Precautions / Restrictions Precautions Precautions: Fall Recall of Precautions/Restrictions: Intact Restrictions Weight Bearing Restrictions Per Provider Order: No     Mobility  Bed Mobility Overal bed mobility: Needs Assistance Bed Mobility: Supine to Sit     Supine to sit: Max assist, HOB elevated     General bed mobility comments: slow labored movement with most difficulty scooting to EOB    Transfers Overall  transfer level: Needs assistance Equipment used: 1 person hand held assist, None, Rolling walker (2 wheels) Transfers: Sit to/from Stand, Bed to chair/wheelchair/BSC Sit to Stand: Max assist Stand pivot transfers: Max assist         General transfer comment: Patient required Max assist stand pivot with left knee blocked for transferring to chair and able to stand using RW with left hand while being cleaned after bowel movement    Ambulation/Gait                   Stairs             Wheelchair Mobility     Tilt Bed    Modified Rankin (Stroke Patients Only)       Balance Overall balance assessment: Needs assistance Sitting-balance support: Feet supported, No upper extremity supported Sitting balance-Leahy Scale: Poor Sitting balance - Comments: poor progressing to several minutes of unsupported sitting at EOB with CGA. Poor progressing to fair. Postural control: Posterior lean Standing balance support: Single extremity supported, During functional activity Standing balance-Leahy Scale: Poor Standing balance comment: using RW with left hand and hand held assist to support right side                            Communication Communication Communication: No apparent difficulties  Cognition Arousal: Alert Behavior During Therapy: [facility] for tasks assessed/performed                             Following commands: Intact  Cueing Cueing Techniques: Verbal cues, Tactile cues  Exercises      General Comments        Pertinent Vitals/Pain Pain Assessment Pain Assessment: Faces Faces Pain Scale: Hurts little more Pain Location: general body pain, RUE with movement Pain Descriptors / Indicators: Aching, Guarding Pain Intervention(s): Limited activity within patient's tolerance, Monitored during session, Repositioned    Home Living                          Prior Function            PT Goals (current goals can now  be found in the care plan section) Acute Rehab PT Goals Patient Stated Goal: return home with SNF staff to assist PT Goal Formulation: With patient Time For Goal Achievement: 05/27/24 Potential to Achieve Goals: Good Progress towards PT goals: Progressing toward goals    Frequency    Min 3X/week      PT Plan      Co-evaluation PT/OT/SLP Co-Evaluation/Treatment: Yes Reason for Co-Treatment: To address functional/ADL transfers;Complexity of the patient's impairments (multi-system involvement) PT goals addressed during session: Mobility/safety with mobility;Balance;Proper use of DME        AM-PAC PT 6 Clicks Mobility   Outcome Measure  Help needed turning from your back to your side while in a flat bed without using bedrails?: A Lot Help needed moving from lying on your back to sitting on the side of a flat bed without using bedrails?: A Lot Help needed moving to and from a bed to a chair (including a wheelchair)?: A Lot Help needed standing up from a chair using your arms (e.g., wheelchair or bedside chair)?: A Lot Help needed to walk in hospital room?: Total Help needed climbing 3-5 steps with a railing? : Total 6 Click Score: 10    End of Session Equipment Utilized During Treatment: Gait belt Activity Tolerance: Patient tolerated treatment well;Patient limited by fatigue Patient left: in chair;with call bell/phone within reach Nurse Communication: Mobility status PT Visit Diagnosis: Other abnormalities of gait and mobility (R26.89);Unsteadiness on feet (R26.81);Muscle weakness (generalized) (M62.81)     Time: 9182-9150 PT Time Calculation (min) (ACUTE ONLY): 32 min  Charges:    $Therapeutic Activity: 23-37 mins PT General Charges $$ ACUTE PT VISIT: 1 Visit                     2:27 PM, 05/14/24 Lynwood Music, MPT Physical Therapist with [facility] 336 [phone] office [phone] mobile phone

## 2024-05-14 NOTE — Progress Notes (Signed)
 Occupational Therapy Treatment Patient Details Name: Mason [name] MRN: [id number] DOB: 11-09-1945 Today's Date: 05/14/2024   History of present illness Mason [name] is a 78 yo male with pmhx significant for depression, anxiety, GERD, IBS, skin cancer, HTN, CVA, afib (on Eliquis ).  Pt resides at [facility] due to his CVA -hemiplegia, chronic right-sided weakness. The facility suspected UTI due to dysuria sent a urine sample for cx on 9/16.  It came back today growing over > 100 K colony forming units.  It did not specify which bacteria. (per MD)   OT comments  Pt agreeable to OT and PT co-treatment due to complexity of pt needs. Pt was able to transfer with max A to chair followed by sit to stand with max A using L UE through RW while this OT completed peri-care for the pt. Pt was able to wash his own face with set up assist while seated in the recliner. Pt BP remained high today and did not drop after the transfer as it did last session. Pt left in the chair with call bell within reach and chair alarm set. Pt will benefit from continued OT in the hospital to increase strength, balance, and endurance for safe ADL's.         If plan is discharge home, recommend the following:  Two people to help with walking and/or transfers;Two people to help with bathing/dressing/bathroom;Assistance with cooking/housework;Assistance with feeding;Direct supervision/assist for medications management;Assist for transportation;Help with stairs or ramp for entrance   Equipment Recommendations  None recommended by OT          Precautions / Restrictions Precautions Precautions: Fall Recall of Precautions/Restrictions: Impaired Restrictions Weight Bearing Restrictions Per Provider Order: No       Mobility Bed Mobility Overal bed mobility: Needs Assistance Bed Mobility: Supine to Sit     Supine to sit: Max assist, HOB elevated     General bed mobility comments: B LE and torso assist to sit  at EOB.    Transfers Overall transfer level: Needs assistance Equipment used: 1 person hand held assist, None, Rolling walker (2 wheels) Transfers: Sit to/from Stand, Bed to chair/wheelchair/BSC Sit to Stand: Max assist Stand pivot transfers: Max assist         General transfer comment: EOB to chair stand pivot followed by sit to stand with PT using L UE through RW while this therapist assisted with peri-care.     Balance Overall balance assessment: Needs assistance Sitting-balance support: Feet supported, No upper extremity supported Sitting balance-Leahy Scale: Poor Sitting balance - Comments: poor progressing to several minutes of unsupported sitting at EOB with CGA. Poor progressing to fair. Postural control: Posterior lean Standing balance support: Single extremity supported, During functional activity Standing balance-Leahy Scale: Poor Standing balance comment: Without AD; therapist assisting                           ADL either performed or assessed with clinical judgement   ADL Overall ADL's : Needs assistance/impaired     Grooming: Set up;Wash/dry face;Sitting Grooming Details (indicate cue type and reason): Pt was able to wash his face while seated in the recliner using a wash cloth and L UE.             Lower Body Dressing: Total assistance;Maximal assistance;Sitting/lateral leans Lower Body Dressing Details (indicate cue type and reason): Attempted to have pt doff his L LE sock while seatedin the chair, but he struggled to  bring L LE to his R knee. Attempt discontinued.     Toileting- Clothing Manipulation and Hygiene: Total assistance;Maximal assistance;Sit to/from stand Toileting - Clothing Manipulation Details (indicate cue type and reason): Pt stood with physical therapist while this therapist completed peri-care after pt had a bowel movement.             Communication Communication Communication: No apparent difficulties   Cognition  Arousal: Alert Behavior During Therapy: WFL for tasks assessed/performed Cognition: No family/caregiver present to determine baseline             OT - Cognition Comments: Pt reported having trouble gathering his thoughts.                 Following commands: Intact        Cueing   Cueing Techniques: Verbal cues, Gestural cues, Tactile cues  Exercises                   Pertinent Vitals/ Pain       Pain Assessment Pain Assessment: Faces Faces Pain Scale: Hurts little more Pain Location: general body pain Pain Descriptors / Indicators: Aching Pain Intervention(s): Monitored during session, Repositioned                                                          Frequency  Min 2X/week        Progress Toward Goals  OT Goals(current goals can now be found in the care plan section)  Progress towards OT goals: Progressing toward goals  Acute Rehab OT Goals Patient Stated Goal: return to rehab OT Goal Formulation: With patient Time For Goal Achievement: 05/27/24 Potential to Achieve Goals: Good ADL Goals Pt Will Perform Eating: with set-up;sitting Pt Will Perform Grooming: with min assist;sitting Pt Will Perform Upper Body Dressing: with contact guard assist;with min assist;sitting Pt Will Transfer to Toilet: with mod assist;stand pivot transfer Pt/caregiver will Perform Home Exercise Program: Increased ROM;Right Upper extremity;With minimal assist  Plan      Co-evaluation    PT/OT/SLP Co-Evaluation/Treatment: Yes Reason for Co-Treatment: To address functional/ADL transfers;Complexity of the patient's impairments (multi-system involvement)   OT goals addressed during session: ADL's and self-care                          End of Session Equipment Utilized During Treatment: Rolling walker (2 wheels)  OT Visit Diagnosis: Muscle weakness (generalized) (M62.81);Other abnormalities of gait and mobility (R26.89)    Activity Tolerance Patient tolerated treatment well   Patient Left in chair;with call bell/phone within reach;with chair alarm set   Nurse Communication          Time: [phone] OT Time Calculation (min): 35 min  Charges: OT General Charges $OT Visit: 1 Visit OT Treatments $Self Care/Home Management : 8-22 mins (Only one unit taken due to co-treat with PT.)  JAYSON PERSON OT, MOT   JAYSON PERSON 05/14/2024, 9:50 AM

## 2024-05-14 NOTE — Progress Notes (Signed)
 PROGRESS NOTE    Patient: Mason [name]                            PCP: Cleatus Arlyss RAMAN, MD                    DOB: Feb 23, 1946            DOA: 05/12/2024 FMW:[id number]             DOS: 05/14/2024, 11:06 AM   LOS: 1 day   Date of Service: The patient was seen and examined on 05/14/2024  Subjective:    The patient was seen and examined this morning, much more, following command, communicating Hypotensive this morning, BP 88/50, Tmax this morning 101.2, currently 98.5 Elevated WBC to 19.0 S/p IVF LR bolus early this morning   Brief Narrative:   Mason [name] is a 78 yo male with pmhx significant for depression, anxiety, GERD, IBS, skin cancer, HTN, CVA, afib (on Eliquis ).  Pt resides at [facility] due to his CVA -hemiplegia, chronic right-sided weakness. The facility suspected UTI due to dysuria sent a urine sample for cx on 9/16.  It came back today growing over > 100 K colony forming units.  It did not specify which bacteria.    Picture of cx results in media section as it's MDRO.   He apparently has an indwelling FC, but pulled it out today.  Per ED report patient was febrile, and started vomiting today. Poor historian very few words  ED Course: Blood pressure 113/66, pulse 89, temperature (!) 103.3 F (39.6 C), temperature source Rectal, resp. rate 19, weight 71.7 kg, SpO2 98%. LABs: WBC; 15.5, platelets of 522, lactic acid 1.2, UA: Hazy, large Hgb, large leukocyte esterase, 30 protein, bacteria rare, WBC >50 CXR-no acute cardiopulmonary process, questionable small nodule  Patient was noted for sepsis:  likely from urine.  Treated with IV flagyl , maxipime , and vancomycin .    No other source was identified - Urinary retention: Foley catheter was placed    Assessment & Plan:   Principal Problem:   Sepsis (HCC) Active Problems:   Atrial fibrillation (HCC)   Ambulatory dysfunction   Anxiety state   BPH (benign prostatic hyperplasia)   History of urinary  retention   GERD (gastroesophageal reflux disease)   Glaucoma   Irritable bowel syndrome   Depression   Stroke (cerebrum) (HCC)   History of hemorrhagic stroke with residual hemiplegia (HCC)   Essential hypertension     Assessment and Plan: * Sepsis (HCC) -due to UTI - Bacteremia -Urosepsis due to chronic Foley catheter - Both urine/blood culture growing Proteus - Pending sensitivity  -Sepsis physiology resolved, afebrile, normotensive this a.m.  -Responding well to current antibiotics  - Will continue with IV 2 g of Rocephin  daily   POA: Met sepsis criteria with Temp.(!) 103.3 F (39.6 C), HR 110,,   WBC; 15.5, platelets of 522, lactic acid 1.2, UA: Hazy, large hemoglobin, with leukocyte esterase, WBC > 50  - Continuing with IV fluid resuscitation, continue current antibiotics monitoring cultures    POA  In ED received IV flagyl , maxipime , and vancomycin .... Since source of infection thought to be urine, on Rocephin  2 g daily will continue for now   - Urine report from facility was reviewed, > 100 K colony Proteus, multiresistant but sensitive to cefepime , Rocephin      Ambulatory dysfunction -Hemiplegic with chronic right-sided weakness - fall precautions,  PT OT when stable  Atrial fibrillation (HCC) - In normal sinus rhythm, continue Eliquis , -No rate control medications such as beta-blocker noted on the med list   History of urinary retention - Noted neurogenic bladder-chronic Foley catheter, was replaced today in ED 05/12/2024  BPH (benign prostatic hyperplasia) - With history of neurogenic bladder, urinary retention, -Continue home medication of Flomax   Anxiety state - Currently stable, continue home medication trazodone , as needed benzodiazepines  Glaucoma Continue home eyedrops  GERD (gastroesophageal reflux disease) Continue PPI   Essential hypertension - Monitor for hypotension due to soft sepsis -Holding BP meds due to hypotension -Home  meds: Norvasc , losartan   History of hemorrhagic stroke with residual hemiplegia (HCC) -Dense chronic right-sided weakness - Hematologic with debility, restricted mobility, urinary retention, -Once stable reevaluating PT OT -Continuing home medication of baclofen , Eliquis , as needed Robaxin  -Change position in bed every 2 hours to avoid pressure ulcers  Irritable bowel syndrome - As needed bowel regimen  ----------------------------------------------------------------------------- Nutritional status:  The patient's BMI is: Body mass index is 25.5 kg/m. I agree with the assessment and plan as outlined     Cultures; Blood Cultures x 2 >> Proteus Mirabilis  Urine Culture  >>> Proteus Mirabilis   Repeating blood cultures    ----------------------------------------------------------------------------- DVT prophylaxis:  apixaban  (ELIQUIS ) tablet 2.5 mg Start: 05/12/24 2200 SCDs Start: 05/12/24 1538 apixaban  (ELIQUIS ) tablet 2.5 mg   Code Status:   Code Status: Do not attempt resuscitation (DNR) PRE-ARREST INTERVENTIONS DESIRED  Family Communication: No family member present at bedside-  -Advance care planning has been discussed.   Admission status:   Status is: Inpatient Remains inpatient appropriate because: Needing monitoring in the stepdown unit for treatment of sepsis, hypotension, needing IV antibiotics, IV fluids   Disposition: From  - SNF             Planning for discharge in 2-3  days back to SNF  Procedures:   No admission procedures for hospital encounter.   Antimicrobials:  Anti-infectives (From admission, onward)    Start     Dose/Rate Route Frequency Ordered Stop   05/12/24 2200  cefTRIAXone  (ROCEPHIN ) 2 g in sodium chloride  0.9 % 100 mL IVPB        2 g 200 mL/hr over 30 Minutes Intravenous Every 24 hours 05/12/24 1541 05/19/24 2159   05/12/24 1300  vancomycin  (VANCOREADY) IVPB 1500 mg/300 mL        1,500 mg 150 mL/hr over 120 Minutes Intravenous   Once 05/12/24 1231 05/12/24 1708   05/12/24 1230  ceFEPIme  (MAXIPIME ) 2 g in sodium chloride  0.9 % 100 mL IVPB        2 g 200 mL/hr over 30 Minutes Intravenous  Once 05/12/24 1225 05/12/24 1520   05/12/24 1230  metroNIDAZOLE  (FLAGYL ) IVPB 500 mg        500 mg 100 mL/hr over 60 Minutes Intravenous  Once 05/12/24 1225 05/12/24 1520   05/12/24 1230  vancomycin  (VANCOCIN ) IVPB 1000 mg/200 mL premix  Status:  Discontinued        1,000 mg 200 mL/hr over 60 Minutes Intravenous  Once 05/12/24 1225 05/12/24 1231        Medication:   apixaban   2.5 mg Oral BID   baclofen   5 mg Oral TID   Chlorhexidine  Gluconate Cloth  6 each Topical Q0600   famotidine   20 mg Oral Daily   hydrocortisone   1 Application Rectal BID   latanoprost   1 drop Both Eyes QHS   melatonin  5 mg Oral QHS   polyethylene glycol  17 g Oral Daily   pregabalin   25 mg Oral BID   senna-docusate  1 tablet Oral Daily   sodium chloride  flush  3 mL Intravenous Q12H   sodium chloride  flush  3 mL Intravenous Q12H   tamsulosin   0.4 mg Oral QPC supper   timolol   1 drop Both Eyes Daily    acetaminophen  **OR** acetaminophen , hydrALAZINE , HYDROmorphone  (DILAUDID ) injection, ipratropium, levalbuterol , methocarbamol , nystatin  ointment, ondansetron  **OR** ondansetron  (ZOFRAN ) IV, oxyCODONE , traZODone    Objective:   Vitals:   05/14/24 0900 05/14/24 0958 05/14/24 1000 05/14/24 1100  BP: 129/68  132/71 111/73  Pulse: 61 (!) 58 65 (!) 55  Resp: 16 15 14 17   Temp:      TempSrc:      SpO2: 96% 95% 96% 97%  Weight:      Height:        Intake/Output Summary (Last 24 hours) at 05/14/2024 1106 Last data filed at 05/14/2024 0401 Gross per 24 hour  Intake 2303.01 ml  Output 425 ml  Net 1878.01 ml   Filed Weights   05/12/24 1827 05/13/24 0434 05/14/24 0401  Weight: 77.9 kg 77.9 kg 80.6 kg     Physical examination:      General:  AAO x 3,  cooperative, no distress;   HEENT:  Normocephalic, PERRL, otherwise with in Normal limits    Neuro:  Speech intact, dense right-sided hemiplegic CNII-XII intact. , normal motor and sensation, reflexes intact   Lungs:   Clear to auscultation BL, Respirations unlabored,  No wheezes / crackles  Cardio:    S1/S2, RRR, No murmure, No Rubs or Gallops   Abdomen:  soft, non-tender, bowel sounds active all four quadrants, no guarding or peritoneal signs.  Muscular  skeletal:  Dense right upper and lower extremity weakness, right hand contracted  Limited exam -global generalized weaknesses - in bed, able to move all 4 extremities,   2+ pulses,  symmetric, No pitting edema  Skin:  Dry, warm to touch, negative for any Rashes,  -Foley cath in place, replaced on yhis admission    ----------------------------------------------------------------------------------------------------    LABs:     Latest Ref Rng & Units 05/14/2024    4:49 AM 05/13/2024    3:36 AM 05/12/2024    1:09 PM  CBC  WBC 4.0 - 10.5 K/uL 13.7  19.0  15.5   Hemoglobin 13.0 - 17.0 g/dL 86.5  84.7  82.6   Hematocrit 39.0 - 52.0 % 41.4  48.5  54.3   Platelets 150 - 400 K/uL 361  462  522       Latest Ref Rng & Units 05/14/2024    4:49 AM 05/13/2024    3:36 AM 05/12/2024    1:09 PM  CMP  Glucose 70 - 99 mg/dL 91  99  88   BUN 8 - 23 mg/dL 15  18  20    Creatinine 0.61 - 1.24 mg/dL 9.22  8.82  8.91   Sodium 135 - 145 mmol/L 134  138  137   Potassium 3.5 - 5.1 mmol/L 3.9  4.6  4.1   Chloride 98 - 111 mmol/L 104  105  103   CO2 22 - 32 mmol/L 24  26  23    Calcium 8.9 - 10.3 mg/dL 7.7  7.9  8.8   Total Protein 6.5 - 8.1 g/dL 5.4  6.1  7.1   Total Bilirubin 0.0 - 1.2 mg/dL 0.6  0.8  0.9  Alkaline Phos 38 - 126 U/L 69  69  110   AST 15 - 41 U/L 53  31  21   ALT 0 - 44 U/L 50  23  15        Micro Results Recent Results (from the past 240 hours)  Resp panel by RT-PCR (RSV, Flu A&B, Covid) Anterior Nasal Swab     Status: None   Collection Time: 05/12/24 12:25 PM   Specimen: Anterior Nasal Swab  Result Value Ref  Range Status   SARS Coronavirus 2 by RT PCR NEGATIVE NEGATIVE Final    Comment: (NOTE) SARS-CoV-2 target nucleic acids are NOT DETECTED.  The SARS-CoV-2 RNA is generally detectable in upper respiratory specimens during the acute phase of infection. The lowest concentration of SARS-CoV-2 viral copies this assay can detect is 138 copies/mL. A negative result does not preclude SARS-Cov-2 infection and should not be used as the sole basis for treatment or other patient management decisions. A negative result may occur with  improper specimen collection/handling, submission of specimen other than nasopharyngeal swab, presence of viral mutation(s) within the areas targeted by this assay, and inadequate number of viral copies(<138 copies/mL). A negative result must be combined with clinical observations, patient history, and epidemiological information. The expected result is Negative.  Fact Sheet for Patients:  BloggerCourse.com  Fact Sheet for Healthcare Providers:  SeriousBroker.it  This test is no t yet approved or cleared by the [place]  FDA and  has been authorized for detection and/or diagnosis of SARS-CoV-2 by FDA under an Emergency Use Authorization (EUA). This EUA will remain  in effect (meaning this test can be used) for the duration of the COVID-19 declaration under Section 564(b)(1) of the Act, 21 U.S.C.section 360bbb-3(b)(1), unless the authorization is terminated  or revoked sooner.       Influenza A by PCR NEGATIVE NEGATIVE Final   Influenza B by PCR NEGATIVE NEGATIVE Final    Comment: (NOTE) The Xpert Xpress SARS-CoV-2/FLU/RSV plus assay is intended as an aid in the diagnosis of influenza from Nasopharyngeal swab specimens and should not be used as a sole basis for treatment. Nasal washings and aspirates are unacceptable for Xpert Xpress SARS-CoV-2/FLU/RSV testing.  Fact Sheet for  Patients: BloggerCourse.com  Fact Sheet for Healthcare Providers: SeriousBroker.it  This test is not yet approved or cleared by the [place]  FDA and has been authorized for detection and/or diagnosis of SARS-CoV-2 by FDA under an Emergency Use Authorization (EUA). This EUA will remain in effect (meaning this test can be used) for the duration of the COVID-19 declaration under Section 564(b)(1) of the Act, 21 U.S.C. section 360bbb-3(b)(1), unless the authorization is terminated or revoked.     Resp Syncytial Virus by PCR NEGATIVE NEGATIVE Final    Comment: (NOTE) Fact Sheet for Patients: BloggerCourse.com  Fact Sheet for Healthcare Providers: SeriousBroker.it  This test is not yet approved or cleared by the [place]  FDA and has been authorized for detection and/or diagnosis of SARS-CoV-2 by FDA under an Emergency Use Authorization (EUA). This EUA will remain in effect (meaning this test can be used) for the duration of the COVID-19 declaration under Section 564(b)(1) of the Act, 21 U.S.C. section 360bbb-3(b)(1), unless the authorization is terminated or revoked.  Performed at [facility], [address] [place], [place] [address]   Blood Culture (routine x 2)     Status: Abnormal (Preliminary result)   Collection Time: 05/12/24  1:09 PM   Specimen: BLOOD  Result Value Ref Range  Status   Specimen Description   Final    BLOOD LEFT ANTECUBITAL Performed at [facility], [address] [place], [place] [address]    Special Requests   Final    BOTTLES DRAWN AEROBIC AND ANAEROBIC Blood Culture adequate volume Performed at [facility], [address], [place], [place] [address]    Culture  Setup Time   Final    AEROBIC BOTTLE ONLY GRAM NEGATIVE RODS Gram Stain Report Called to,Read Back By and Verified With: G. ROOS RN 05/13/24 @0943  BY J.WHITE GRAM STAIN REVIEWED-AGREE  WITH RESULT DRT Performed at [facility], [address] [place], [place] [address]    Culture PROTEUS MIRABILIS (A)  Final   Report Status PENDING  Incomplete  Blood Culture (routine x 2)     Status: Abnormal (Preliminary result)   Collection Time: 05/12/24  1:09 PM   Specimen: BLOOD RIGHT HAND  Result Value Ref Range Status   Specimen Description   Final    BLOOD RIGHT HAND Performed at [facility], [address], [place], [place] [address]    Special Requests   Final    BOTTLES DRAWN AEROBIC AND ANAEROBIC Blood Culture adequate volume Performed at [facility], [address] [place], [place] [address]    Culture  Setup Time   Final    AEROBIC BOTTLE ONLY GRAM NEGATIVE RODS Gram Stain Report Called to,Read Back By and Verified With: G ROOS RN 05/13/24 @0942  BY J. WHITE Organism ID to follow CRITICAL RESULT CALLED TO, READ BACK BY AND VERIFIED WITH: PHARMD LORI POOLE ON 05/13/24 @ 1430 BY DRT    Culture (A)  Final    PROTEUS MIRABILIS SUSCEPTIBILITIES TO FOLLOW Performed at [facility], [address] [place], [place] [address]    Report Status PENDING  Incomplete  Blood Culture ID Panel (Reflexed)     Status: Abnormal   Collection Time: 05/12/24  1:09 PM  Result Value Ref Range Status   Enterococcus faecalis NOT DETECTED NOT DETECTED Final   Enterococcus Faecium NOT DETECTED NOT DETECTED Final   Listeria monocytogenes NOT DETECTED NOT DETECTED Final   Staphylococcus species NOT DETECTED NOT DETECTED Final   Staphylococcus aureus (BCID) NOT DETECTED NOT DETECTED Final   Staphylococcus epidermidis NOT DETECTED NOT DETECTED Final   Staphylococcus lugdunensis NOT DETECTED NOT DETECTED Final   Streptococcus species NOT DETECTED NOT DETECTED Final   Streptococcus agalactiae NOT DETECTED NOT DETECTED Final   Streptococcus pneumoniae NOT DETECTED NOT DETECTED Final   Streptococcus pyogenes NOT DETECTED NOT DETECTED Final   A.calcoaceticus-baumannii NOT  DETECTED NOT DETECTED Final   Bacteroides fragilis NOT DETECTED NOT DETECTED Final   Enterobacterales DETECTED (A) NOT DETECTED Final    Comment: Enterobacterales represent a large order of gram negative bacteria, not a single organism. CRITICAL RESULT CALLED TO, READ BACK BY AND VERIFIED WITH: PHARMD LORI POOLE ON 05/13/24 @ 1430 BY DRT    Enterobacter cloacae complex NOT DETECTED NOT DETECTED Final   Escherichia coli NOT DETECTED NOT DETECTED Final   Klebsiella aerogenes NOT DETECTED NOT DETECTED Final   Klebsiella oxytoca NOT DETECTED NOT DETECTED Final   Klebsiella pneumoniae NOT DETECTED NOT DETECTED Final   Proteus species DETECTED (A) NOT DETECTED Final    Comment: CRITICAL RESULT CALLED TO, READ BACK BY AND VERIFIED WITH: PHARMD LORI POOLE ON 05/13/24 @ 1430 BY DRT    Salmonella species NOT DETECTED NOT DETECTED Final   Serratia marcescens NOT DETECTED NOT DETECTED  Final   Haemophilus influenzae NOT DETECTED NOT DETECTED Final   Neisseria meningitidis NOT DETECTED NOT DETECTED Final   Pseudomonas aeruginosa NOT DETECTED NOT DETECTED Final   Stenotrophomonas maltophilia NOT DETECTED NOT DETECTED Final   Candida albicans NOT DETECTED NOT DETECTED Final   Candida auris NOT DETECTED NOT DETECTED Final   Candida glabrata NOT DETECTED NOT DETECTED Final   Candida krusei NOT DETECTED NOT DETECTED Final   Candida parapsilosis NOT DETECTED NOT DETECTED Final   Candida tropicalis NOT DETECTED NOT DETECTED Final   Cryptococcus neoformans/gattii NOT DETECTED NOT DETECTED Final   CTX-M ESBL NOT DETECTED NOT DETECTED Final   Carbapenem resistance IMP NOT DETECTED NOT DETECTED Final   Carbapenem resistance KPC NOT DETECTED NOT DETECTED Final   Carbapenem resistance NDM NOT DETECTED NOT DETECTED Final   Carbapenem resist OXA 48 LIKE NOT DETECTED NOT DETECTED Final   Carbapenem resistance VIM NOT DETECTED NOT DETECTED Final    Comment: Performed at [facility], [address]  [place], [place] [address]  Urine Culture     Status: Abnormal   Collection Time: 05/12/24  3:08 PM   Specimen: Urine, Random  Result Value Ref Range Status   Specimen Description   Final    URINE, RANDOM Performed at [facility], [address], [place], [place] [address]    Special Requests   Final    NONE Reflexed from [id number] Performed at [facility], [address] [place], [place] [address]    Culture (A)  Final    <10,000 COLONIES/mL INSIGNIFICANT GROWTH Performed at [facility], [address] [place], [place] [address]    Report Status 05/13/2024 FINAL  Final  MRSA Next Gen by PCR, Nasal     Status: None   Collection Time: 05/12/24  6:25 PM   Specimen: Nasal Mucosa; Nasal Swab  Result Value Ref Range Status   MRSA by PCR Next Gen NOT DETECTED NOT DETECTED Final    Comment: (NOTE) The GeneXpert MRSA Assay (FDA approved for NASAL specimens only), is one component of a comprehensive MRSA colonization surveillance program. It is not intended to diagnose MRSA infection nor to guide or monitor treatment for MRSA infections. Test performance is not FDA approved in patients less than 62 years old. Performed at [facility], [address], [place], [place] [address]     Radiology Reports No results found.   SIGNED: Adriana DELENA Grams, MD, FHM. FAAFP. [facility] - [facility] Critical care time spent - 55 min.  In seeing, evaluating and examining the patient. Reviewing medical records, labs, drawn plan of care. [facility],  Pager (please use [email or web address] to page/ text) Please use Epic Secure Chat for non-urgent communication (7AM-7PM)  If 7PM-7AM, please contact night-coverage [email or web address], 05/14/2024, 11:06 AM

## 2024-05-14 NOTE — TOC Progression Note (Signed)
 Transition of Care [facility] - Progression Note    Patient Details  Name: Mason [name] MRN: [id number] Date of Birth: Jan 02, 1946  [facility]) CM/SW Contact  Lucie Lunger, [place] Phone Number: 05/14/2024, 9:10 AM  Clinical Narrative:    CSW updated that PT has recommended SNF for pt. Insurance auth to be started for return to SNF at [facility] TOC to follow.   Expected Discharge Plan: Skilled Nursing Facility Barriers to Discharge: Continued Medical Work up               Expected Discharge Plan and Services In-house Referral: Clinical Social Work Discharge Planning Services: CM Consult Post Acute Care Choice: Nursing Home Living arrangements for the past 2 months: Skilled Nursing Facility                                       Social Drivers of Health (SDOH) Interventions SDOH Screenings   Food Insecurity: No Food Insecurity (05/12/2024)  Housing: Unknown (05/12/2024)  Transportation Needs: No Transportation Needs (05/12/2024)  Utilities: Not At Risk (05/12/2024)  Alcohol Screen: Low Risk  (07/08/2021)  Depression (PHQ2-9): Low Risk  (04/14/2023)  Financial Resource Strain: Low Risk  (07/20/2022)  Physical Activity: Insufficiently Active (07/20/2022)  Social Connections: Unknown (05/12/2024)  Stress: No Stress Concern Present (07/20/2022)  Tobacco Use: Medium Risk (05/12/2024)    Readmission Risk Interventions     No data to display

## 2024-05-15 DIAGNOSIS — N390 Urinary tract infection, site not specified: Secondary | ICD-10-CM | POA: Diagnosis not present

## 2024-05-15 DIAGNOSIS — A419 Sepsis, unspecified organism: Secondary | ICD-10-CM | POA: Diagnosis not present

## 2024-05-15 LAB — CBC
HCT: 43.7 % (ref 39.0–52.0)
Hemoglobin: 14.2 g/dL (ref 13.0–17.0)
MCH: 27 pg (ref 26.0–34.0)
MCHC: 32.5 g/dL (ref 30.0–36.0)
MCV: 83.1 fL (ref 80.0–100.0)
Platelets: 370 K/uL (ref 150–400)
RBC: 5.26 MIL/uL (ref 4.22–5.81)
RDW: 17.2 % — ABNORMAL HIGH (ref 11.5–15.5)
WBC: 13.2 K/uL — ABNORMAL HIGH (ref 4.0–10.5)
nRBC: 0 % (ref 0.0–0.2)

## 2024-05-15 LAB — GLUCOSE, CAPILLARY: Glucose-Capillary: 109 mg/dL — ABNORMAL HIGH (ref 70–99)

## 2024-05-15 LAB — CULTURE, BLOOD (ROUTINE X 2): Special Requests: ADEQUATE

## 2024-05-15 NOTE — TOC Progression Note (Signed)
 Transition of Care [facility]) - Progression Note    Patient Details  Name: Mason [name] MRN: [id number] Date of Birth: [birth year]  Transition [facility] Care [facility]) CM/SW Contact  Noreen KATHEE [place], [place] Phone Number: 05/15/2024, 1:17 PM  Clinical Narrative:     Patient insurance asked for IV duration and stop date. Information was provided to patient insurance portable. [name] is still pending. CSW updated JC. CSW will continue to follow.  Expected Discharge Plan: Skilled Nursing Facility Barriers to Discharge: Continued Medical Work up     Expected Discharge Plan and Services In-house Referral: Clinical Social Work Discharge Planning Services: CM Consult Post Acute Care Choice: Nursing Home Living arrangements for the past 2 months: Skilled Nursing Facility                    Social Drivers of Health (SDOH) Interventions SDOH Screenings   Food Insecurity: No Food Insecurity (05/12/2024)  Housing: Low Risk  (05/15/2024)  Transportation Needs: No Transportation Needs (05/12/2024)  Utilities: Not At Risk (05/12/2024)  Alcohol Screen: Low Risk  (07/08/2021)  Depression (PHQ2-9): Low Risk  (04/14/2023)  Financial Resource Strain: Low Risk  (07/20/2022)  Physical Activity: Insufficiently Active (07/20/2022)  Social Connections: Unknown (05/12/2024)  Stress: No Stress Concern Present (07/20/2022)  Tobacco Use: Medium Risk (05/12/2024)    Readmission Risk Interventions    05/15/2024    1:16 PM  Readmission Risk Prevention Plan  Transportation Screening Complete  Home Care Screening Complete  Medication Review (RN CM) Complete

## 2024-05-15 NOTE — Progress Notes (Signed)
 PROGRESS NOTE    Patient: Mason [name]                            PCP: Cleatus Arlyss RAMAN, MD                    DOB: [birth year]            DOA: 05/12/2024 FMW:[id number]             DOS: 05/15/2024, 1:26 PM   LOS: 2 days   Date of Service: The patient was seen and examined on 05/15/2024  Subjective:    The patient was seen and examined this morning, much more, following command, communicating Hypotensive this morning, BP 88/50, Tmax this morning 101.2, currently 98.5 Elevated WBC to 19.0 S/p IVF LR bolus early this morning   Brief Narrative:   GEVORG BRUM is a 78 yo male with pmhx significant for depression, anxiety, GERD, IBS, skin cancer, HTN, CVA, afib (on Eliquis ).  Pt resides at [facility] due to his CVA -hemiplegia, chronic right-sided weakness. The facility suspected UTI due to dysuria sent a urine sample for cx on 9/16.  It came back today growing over > 100 K colony forming units.  It did not specify which bacteria.    Picture of cx results in media section as it's MDRO.   He apparently has an indwelling FC, but pulled it out today.  Per ED report patient was febrile, and started vomiting today. Poor historian very few words  ED Course: Blood pressure 113/66, pulse 89, temperature (!) 103.3 F (39.6 C), temperature source Rectal, resp. rate 19, weight 71.7 kg, SpO2 98%. LABs: WBC; 15.5, platelets of 522, lactic acid 1.2, UA: Hazy, large Hgb, large leukocyte esterase, 30 protein, bacteria rare, WBC >50 CXR-no acute cardiopulmonary process, questionable small nodule  Patient was noted for sepsis:  likely from urine.  Treated with IV flagyl , maxipime , and vancomycin .    No other source was identified - Urinary retention: Foley catheter was placed    Assessment & Plan:   Principal Problem:   Sepsis (HCC) Active Problems:   Atrial fibrillation (HCC)   Ambulatory dysfunction   Anxiety state   BPH (benign prostatic hyperplasia)   History of urinary  retention   GERD (gastroesophageal reflux disease)   Glaucoma   Irritable bowel syndrome   Depression   Stroke (cerebrum) (HCC)   History of hemorrhagic stroke with residual hemiplegia (HCC)   Essential hypertension     Assessment and Plan: * Sepsis (HCC) -due to UTI - Bacteremia -Resolved sepsis physiology -Urosepsis due to chronic Foley catheter - Both urine/blood culture --growing Proteus - Pending sensitivity  -Repeat blood cultures 05/14/2024 >>>      -Responding well to current antibiotics  - Will continue with IV 2 g of Rocephin  daily   POA: Met sepsis criteria with Temp.(!) 103.3 F (39.6 C), HR 110,,   WBC; 15.5, platelets of 522, lactic acid 1.2, UA: Hazy, large hemoglobin, with leukocyte esterase, WBC > 50  - Continuing with IV fluid resuscitation, continue current antibiotics monitoring cultures    POA  In ED received IV flagyl , maxipime , and vancomycin .... Since source of infection thought to be urine, on Rocephin  2 g daily will continue for now   - Urine report from facility was reviewed, > 100 K colony Proteus, multiresistant but sensitive to cefepime , Rocephin   Ambulatory dysfunction Stable -Hemiplegic with chronic right-sided weakness -  fall precautions, PT OT when stable  Atrial fibrillation (HCC) - In normal sinus rhythm, continue Eliquis , -No rate control medications such as beta-blocker noted on the med list   History of urinary retention - Noted neurogenic bladder-chronic Foley catheter, was replaced today in ED 05/12/2024  BPH (benign prostatic hyperplasia) - With history of neurogenic bladder, urinary retention, -Continue home medication of Flomax   Anxiety state - Stable, continue home medication trazodone , as needed benzodiazepines  Glaucoma Continue home eyedrops  GERD (gastroesophageal reflux disease) Continue PPI   Essential hypertension - Monitor for hypotension due to soft sepsis -Holding BP meds due to  hypotension -Home meds: Norvasc , losartan   History of hemorrhagic stroke with residual hemiplegia (HCC) -Dense chronic right-sided weakness - Hematologic with debility, restricted mobility, urinary retention, -Once stable reevaluating PT OT -Continuing home medication of baclofen , Eliquis , as needed Robaxin  -Change position in bed every 2 hours to avoid pressure ulcers  Irritable bowel syndrome - As needed bowel regimen  ----------------------------------------------------------------------------- Nutritional status:  The patient's BMI is: Body mass index is 26 kg/m. I agree with the assessment and plan as outlined     Cultures; Blood Cultures x 2 >> Proteus Mirabilis  Urine Culture  >>> Proteus Mirabilis   Repeating blood cultures    ----------------------------------------------------------------------------- DVT prophylaxis:  apixaban  (ELIQUIS ) tablet 2.5 mg Start: 05/12/24 2200 SCDs Start: 05/12/24 1538 apixaban  (ELIQUIS ) tablet 2.5 mg   Code Status:   Code Status: Do not attempt resuscitation (DNR) PRE-ARREST INTERVENTIONS DESIRED  Family Communication: No family member present at bedside-  -Advance care planning has been discussed.   Admission status:   Status is: Inpatient Remains inpatient appropriate because: Needing monitoring in the stepdown unit for treatment of sepsis, hypotension, needing IV antibiotics, IV fluids   Disposition: From  - SNF             Planning for discharge in 1 days back to SNF  Procedures:   No admission procedures for hospital encounter.   Antimicrobials:  Anti-infectives (From admission, onward)    Start     Dose/Rate Route Frequency Ordered Stop   05/12/24 2200  cefTRIAXone  (ROCEPHIN ) 2 g in sodium chloride  0.9 % 100 mL IVPB        2 g 200 mL/hr over 30 Minutes Intravenous Every 24 hours 05/12/24 1541 05/19/24 2159   05/12/24 1300  vancomycin  (VANCOREADY) IVPB 1500 mg/300 mL        1,500 mg 150 mL/hr over 120 Minutes  Intravenous  Once 05/12/24 1231 05/12/24 1708   05/12/24 1230  ceFEPIme  (MAXIPIME ) 2 g in sodium chloride  0.9 % 100 mL IVPB        2 g 200 mL/hr over 30 Minutes Intravenous  Once 05/12/24 1225 05/12/24 1520   05/12/24 1230  metroNIDAZOLE  (FLAGYL ) IVPB 500 mg        500 mg 100 mL/hr over 60 Minutes Intravenous  Once 05/12/24 1225 05/12/24 1520   05/12/24 1230  vancomycin  (VANCOCIN ) IVPB 1000 mg/200 mL premix  Status:  Discontinued        1,000 mg 200 mL/hr over 60 Minutes Intravenous  Once 05/12/24 1225 05/12/24 1231        Medication:   apixaban   2.5 mg Oral BID   baclofen   5 mg Oral TID   Chlorhexidine  Gluconate Cloth  6 each Topical Q0600   famotidine   20 mg Oral Daily   hydrocortisone   1 Application Rectal BID   latanoprost   1 drop Both Eyes QHS   melatonin  6 mg Oral QHS   polyethylene glycol  17 g Oral Daily   pregabalin   25 mg Oral BID   senna-docusate  1 tablet Oral Daily   sodium chloride  flush  3 mL Intravenous Q12H   sodium chloride  flush  3 mL Intravenous Q12H   tamsulosin   0.4 mg Oral QPC supper   timolol   1 drop Both Eyes Daily    acetaminophen  **OR** acetaminophen , hydrALAZINE , HYDROmorphone  (DILAUDID ) injection, ipratropium, levalbuterol , methocarbamol , nystatin  ointment, ondansetron  **OR** ondansetron  (ZOFRAN ) IV, oxyCODONE , traZODone    Objective:   Vitals:   05/14/24 1900 05/14/24 2300 05/15/24 0319 05/15/24 1130  BP: (!) 144/52 (!) 158/95 (!) 153/92 117/82  Pulse:   74 88  Resp: 18 18 18 18   Temp: 98.7 F (37.1 C) 98.2 F (36.8 C) 98.2 F (36.8 C)   TempSrc: Oral Oral Oral   SpO2:  93% 92% 94%  Weight:   82.2 kg   Height:        Intake/Output Summary (Last 24 hours) at 05/15/2024 1326 Last data filed at 05/15/2024 0900 Gross per 24 hour  Intake 243 ml  Output 700 ml  Net -457 ml   Filed Weights   05/14/24 0401 05/14/24 1441 05/15/24 0319  Weight: 80.6 kg 81.9 kg 82.2 kg     Physical examination:        General:  AAO x 2,   cooperative, no distress;   HEENT:  Normocephalic, PERRL, otherwise with in Normal limits   Neuro:  History of CVA with dense right sided hemiplegia CNII-XII intact. , normal motor and sensation, reflexes intact   Lungs:   Clear to auscultation BL, Respirations unlabored,  No wheezes / crackles  Cardio:    S1/S2, RRR, No murmure, No Rubs or Gallops   Abdomen:  Soft, non-tender, bowel sounds active all four quadrants, no guarding or peritoneal signs.  Muscular  skeletal:  Dense right hemiplegia, with right upper extremity contracted Limited exam -global generalized weaknesses - in bed, able to move left upper and lower extremity 2+ pulses,  symmetric, No pitting edema  Skin:  Dry, warm to touch, negative for any Rashes,  Wounds: Please see nursing documentation     -Foley cath in place, replaced on yhis admission    ----------------------------------------------------------------------------------------------------    LABs:     Latest Ref Rng & Units 05/15/2024    4:34 AM 05/14/2024    4:49 AM 05/13/2024    3:36 AM  CBC  WBC 4.0 - 10.5 K/uL 13.2  13.7  19.0   Hemoglobin 13.0 - 17.0 g/dL 85.7  86.5  84.7   Hematocrit 39.0 - 52.0 % 43.7  41.4  48.5   Platelets 150 - 400 K/uL 370  361  462       Latest Ref Rng & Units 05/14/2024    4:49 AM 05/13/2024    3:36 AM 05/12/2024    1:09 PM  CMP  Glucose 70 - 99 mg/dL 91  99  88   BUN 8 - 23 mg/dL 15  18  20    Creatinine 0.61 - 1.24 mg/dL 9.22  8.82  8.91   Sodium 135 - 145 mmol/L 134  138  137   Potassium 3.5 - 5.1 mmol/L 3.9  4.6  4.1   Chloride 98 - 111 mmol/L 104  105  103   CO2 22 - 32 mmol/L 24  26  23    Calcium 8.9 - 10.3 mg/dL 7.7  7.9  8.8   Total Protein 6.5 -  8.1 g/dL 5.4  6.1  7.1   Total Bilirubin 0.0 - 1.2 mg/dL 0.6  0.8  0.9   Alkaline Phos 38 - 126 U/L 69  69  110   AST 15 - 41 U/L 53  31  21   ALT 0 - 44 U/L 50  23  15        Micro Results Recent Results (from the past 240 hours)  Resp panel by RT-PCR (RSV,  Flu A&B, Covid) Anterior Nasal Swab     Status: None   Collection Time: 05/12/24 12:25 PM   Specimen: Anterior Nasal Swab  Result Value Ref Range Status   SARS Coronavirus 2 by RT PCR NEGATIVE NEGATIVE Final    Comment: (NOTE) SARS-CoV-2 target nucleic acids are NOT DETECTED.  The SARS-CoV-2 RNA is generally detectable in upper respiratory specimens during the acute phase of infection. The lowest concentration of SARS-CoV-2 viral copies this assay can detect is 138 copies/mL. A negative result does not preclude SARS-Cov-2 infection and should not be used as the sole basis for treatment or other patient management decisions. A negative result may occur with  improper specimen collection/handling, submission of specimen other than nasopharyngeal swab, presence of viral mutation(s) within the areas targeted by this assay, and inadequate number of viral copies(<138 copies/mL). A negative result must be combined with clinical observations, patient history, and epidemiological information. The expected result is Negative.  Fact Sheet for Patients:  BloggerCourse.com  Fact Sheet for Healthcare Providers:  SeriousBroker.it  This test is no t yet approved or cleared by the [place]  FDA and  has been authorized for detection and/or diagnosis of SARS-CoV-2 by FDA under an Emergency Use Authorization (EUA). This EUA will remain  in effect (meaning this test can be used) for the duration of the COVID-19 declaration under Section 564(b)(1) of the Act, 21 U.S.C.section 360bbb-3(b)(1), unless the authorization is terminated  or revoked sooner.       Influenza A by PCR NEGATIVE NEGATIVE Final   Influenza B by PCR NEGATIVE NEGATIVE Final    Comment: (NOTE) The Xpert Xpress SARS-CoV-2/FLU/RSV plus assay is intended as an aid in the diagnosis of influenza from Nasopharyngeal swab specimens and should not be used as a sole basis for  treatment. Nasal washings and aspirates are unacceptable for Xpert Xpress SARS-CoV-2/FLU/RSV testing.  Fact Sheet for Patients: BloggerCourse.com  Fact Sheet for Healthcare Providers: SeriousBroker.it  This test is not yet approved or cleared by the [place]  FDA and has been authorized for detection and/or diagnosis of SARS-CoV-2 by FDA under an Emergency Use Authorization (EUA). This EUA will remain in effect (meaning this test can be used) for the duration of the COVID-19 declaration under Section 564(b)(1) of the Act, 21 U.S.C. section 360bbb-3(b)(1), unless the authorization is terminated or revoked.     Resp Syncytial Virus by PCR NEGATIVE NEGATIVE Final    Comment: (NOTE) Fact Sheet for Patients: BloggerCourse.com  Fact Sheet for Healthcare Providers: SeriousBroker.it  This test is not yet approved or cleared by the [place]  FDA and has been authorized for detection and/or diagnosis of SARS-CoV-2 by FDA under an Emergency Use Authorization (EUA). This EUA will remain in effect (meaning this test can be used) for the duration of the COVID-19 declaration under Section 564(b)(1) of the Act, 21 U.S.C. section 360bbb-3(b)(1), unless the authorization is terminated or revoked.  Performed at [facility], [address], [place], [place] [address]   Blood Culture (routine x 2)  Status: Abnormal   Collection Time: 05/12/24  1:09 PM   Specimen: BLOOD  Result Value Ref Range Status   Specimen Description   Final    BLOOD LEFT ANTECUBITAL Performed at [facility], [address], [place], [place] [address]    Special Requests   Final    BOTTLES DRAWN AEROBIC AND ANAEROBIC Blood Culture adequate volume Performed at [facility], [address] [place], [place] [address]    Culture  Setup Time   Final    AEROBIC BOTTLE ONLY GRAM NEGATIVE RODS Gram Stain Report  Called to,Read Back By and Verified With: G. ROOS RN 05/13/24 @0943  BY J.WHITE GRAM STAIN REVIEWED-AGREE WITH RESULT DRT    Culture (A)  Final    PROTEUS MIRABILIS SUSCEPTIBILITIES PERFORMED ON PREVIOUS CULTURE WITHIN THE LAST 5 DAYS. Performed at [facility], [address] [place], [place] [address]    Report Status 05/15/2024 FINAL  Final  Blood Culture (routine x 2)     Status: Abnormal   Collection Time: 05/12/24  1:09 PM   Specimen: BLOOD RIGHT HAND  Result Value Ref Range Status   Specimen Description   Final    BLOOD RIGHT HAND Performed at [facility], [address], [place], [place] [address]    Special Requests   Final    BOTTLES DRAWN AEROBIC AND ANAEROBIC Blood Culture adequate volume Performed at [facility], [address], [place], [place] [address]    Culture  Setup Time   Final    AEROBIC BOTTLE ONLY GRAM NEGATIVE RODS Gram Stain Report Called to,Read Back By and Verified With: G ROOS RN 05/13/24 @0942  BY J. WHITE Organism ID to follow CRITICAL RESULT CALLED TO, READ BACK BY AND VERIFIED WITH: PHARMD LORI POOLE ON 05/13/24 @ 1430 BY DRT Performed at [facility], [address], [place], [place] [address]    Culture PROTEUS MIRABILIS (A)  Final   Report Status 05/15/2024 FINAL  Final  Blood Culture ID Panel (Reflexed)     Status: Abnormal   Collection Time: 05/12/24  1:09 PM  Result Value Ref Range Status   Enterococcus faecalis NOT DETECTED NOT DETECTED Final   Enterococcus Faecium NOT DETECTED NOT DETECTED Final   Listeria monocytogenes NOT DETECTED NOT DETECTED Final   Staphylococcus species NOT DETECTED NOT DETECTED Final   Staphylococcus aureus (BCID) NOT DETECTED NOT DETECTED Final   Staphylococcus epidermidis NOT DETECTED NOT DETECTED Final   Staphylococcus lugdunensis NOT DETECTED NOT DETECTED Final   Streptococcus species NOT DETECTED NOT DETECTED Final   Streptococcus agalactiae NOT DETECTED NOT DETECTED Final   Streptococcus  pneumoniae NOT DETECTED NOT DETECTED Final   Streptococcus pyogenes NOT DETECTED NOT DETECTED Final   A.calcoaceticus-baumannii NOT DETECTED NOT DETECTED Final   Bacteroides fragilis NOT DETECTED NOT DETECTED Final   Enterobacterales DETECTED (A) NOT DETECTED Final    Comment: Enterobacterales represent a large order of gram negative bacteria, not a single organism. CRITICAL RESULT CALLED TO, READ BACK BY AND VERIFIED WITH: PHARMD LORI POOLE ON 05/13/24 @ 1430 BY DRT    Enterobacter cloacae complex NOT DETECTED NOT DETECTED Final   Escherichia coli NOT DETECTED NOT DETECTED Final   Klebsiella aerogenes NOT DETECTED NOT DETECTED Final   Klebsiella oxytoca NOT DETECTED NOT DETECTED Final   Klebsiella pneumoniae NOT DETECTED NOT DETECTED Final   Proteus species DETECTED (A) NOT DETECTED Final    Comment: CRITICAL RESULT CALLED TO, READ BACK BY AND VERIFIED WITH: PHARMD  LORI POOLE ON 05/13/24 @ 1430 BY DRT    Salmonella species NOT DETECTED NOT DETECTED Final   Serratia marcescens NOT DETECTED NOT DETECTED Final   Haemophilus influenzae NOT DETECTED NOT DETECTED Final   Neisseria meningitidis NOT DETECTED NOT DETECTED Final   Pseudomonas aeruginosa NOT DETECTED NOT DETECTED Final   Stenotrophomonas maltophilia NOT DETECTED NOT DETECTED Final   Candida albicans NOT DETECTED NOT DETECTED Final   Candida auris NOT DETECTED NOT DETECTED Final   Candida glabrata NOT DETECTED NOT DETECTED Final   Candida krusei NOT DETECTED NOT DETECTED Final   Candida parapsilosis NOT DETECTED NOT DETECTED Final   Candida tropicalis NOT DETECTED NOT DETECTED Final   Cryptococcus neoformans/gattii NOT DETECTED NOT DETECTED Final   CTX-M ESBL NOT DETECTED NOT DETECTED Final   Carbapenem resistance IMP NOT DETECTED NOT DETECTED Final   Carbapenem resistance KPC NOT DETECTED NOT DETECTED Final   Carbapenem resistance NDM NOT DETECTED NOT DETECTED Final   Carbapenem resist OXA 48 LIKE NOT DETECTED NOT DETECTED  Final   Carbapenem resistance VIM NOT DETECTED NOT DETECTED Final    Comment: Performed at [facility], [address], [place], [place] [address]  Urine Culture     Status: Abnormal   Collection Time: 05/12/24  3:08 PM   Specimen: Urine, Random  Result Value Ref Range Status   Specimen Description   Final    URINE, RANDOM Performed at [facility], [address], [place], [place] [address]    Special Requests   Final    NONE Reflexed from [phone] Performed at [facility], [address] [place], [place] [address]    Culture (A)  Final    <10,000 COLONIES/mL INSIGNIFICANT GROWTH Performed at [facility], [address], [place], [place] [address]    Report Status 05/13/2024 FINAL  Final  MRSA Next Gen by PCR, Nasal     Status: None   Collection Time: 05/12/24  6:25 PM   Specimen: Nasal Mucosa; Nasal Swab  Result Value Ref Range Status   MRSA by PCR Next Gen NOT DETECTED NOT DETECTED Final    Comment: (NOTE) The GeneXpert MRSA Assay (FDA approved for NASAL specimens only), is one component of a comprehensive MRSA colonization surveillance program. It is not intended to diagnose MRSA infection nor to guide or monitor treatment for MRSA infections. Test performance is not FDA approved in patients less than 52 years old. Performed at [facility], [address], [place], [place] [address]   Culture, blood (Routine X 2) w Reflex to ID Panel     Status: None (Preliminary result)   Collection Time: 05/14/24  3:25 PM   Specimen: BLOOD  Result Value Ref Range Status   Specimen Description BLOOD BLOOD LEFT ARM LAC  Final   Special Requests   Final    BOTTLES DRAWN AEROBIC AND ANAEROBIC Blood Culture adequate volume   Culture   Final    NO GROWTH < 24 HOURS Performed at [facility], [address], [place], [place] [address]    Report Status PENDING  Incomplete  Culture, blood (Routine X 2) w Reflex to ID Panel     Status: None (Preliminary result)   Collection Time:  05/14/24  3:25 PM   Specimen: BLOOD  Result Value Ref Range Status   Specimen Description BLOOD BLOOD LEFT ARM LT OUTTER AC  Final   Special Requests   Final    BOTTLES DRAWN AEROBIC AND ANAEROBIC Blood Culture adequate  volume   Culture   Final    NO GROWTH < 24 HOURS Performed at [facility], [address], [place], [place] [address]    Report Status PENDING  Incomplete    Radiology Reports No results found.   SIGNED: Adriana DELENA Grams, MD, FHM. FAAFP. [facility] - [facility] Critical care time spent - 55 min.  In seeing, evaluating and examining the patient. Reviewing medical records, labs, drawn plan of care. [facility],  Pager (please use [email or web address] to page/ text) Please use Epic Secure Chat for non-urgent communication (7AM-7PM)  If 7PM-7AM, please contact night-coverage [email or web address], 05/15/2024, 1:26 PM

## 2024-05-15 NOTE — Plan of Care (Signed)
  Problem: Clinical Measurements: Goal: Will remain free from infection Outcome: Progressing   Problem: Activity: Goal: Risk for activity intolerance will decrease Outcome: Progressing   Problem: Coping: Goal: Level of anxiety will decrease Outcome: Progressing   Problem: Elimination: Goal: Will not experience complications related to urinary retention Outcome: Progressing   Problem: Pain Managment: Goal: General experience of comfort will improve and/or be controlled Outcome: Progressing   Problem: Safety: Goal: Ability to remain free from injury will improve Outcome: Progressing   Problem: Skin Integrity: Goal: Risk for impaired skin integrity will decrease Outcome: Progressing

## 2024-05-15 NOTE — Care Management Important Message (Signed)
 Important Message  Patient Details  Name: Mason [name] MRN: [id number] Date of Birth: 11-Mar-1946   Important Message Given:  N/A - LOS <3 / Initial given by admissions     [name] Ada 05/15/2024, 11:32 AM

## 2024-05-15 NOTE — Plan of Care (Signed)
  Problem: Fluid Volume: Goal: Hemodynamic stability will improve Outcome: Progressing   Problem: Clinical Measurements: Goal: Diagnostic test results will improve Outcome: Progressing Goal: Signs and symptoms of infection will decrease Outcome: Progressing   Problem: Respiratory: Goal: Ability to maintain adequate ventilation will improve Outcome: Progressing   Problem: Education: Goal: Knowledge of General Education information will improve Description: Including pain rating scale, medication(s)/side effects and non-pharmacologic comfort measures Outcome: Progressing   Problem: Health Behavior/Discharge Planning: Goal: Ability to manage health-related needs will improve Outcome: Progressing   Problem: Clinical Measurements: Goal: Ability to maintain clinical measurements within normal limits will improve Outcome: Progressing Goal: Will remain free from infection Outcome: Progressing Goal: Diagnostic test results will improve Outcome: Progressing Goal: Respiratory complications will improve Outcome: Progressing Goal: Cardiovascular complication will be avoided Outcome: Progressing   Problem: Activity: Goal: Risk for activity intolerance will decrease Outcome: Progressing   Problem: Nutrition: Goal: Adequate nutrition will be maintained Outcome: Progressing   Problem: Coping: Goal: Level of anxiety will decrease Outcome: Progressing   Problem: Elimination: Goal: Will not experience complications related to bowel motility Outcome: Progressing Goal: Will not experience complications related to urinary retention Outcome: Progressing   Problem: Pain Managment: Goal: General experience of comfort will improve and/or be controlled Outcome: Progressing   Problem: Safety: Goal: Ability to remain free from injury will improve Outcome: Progressing   Problem: Skin Integrity: Goal: Risk for impaired skin integrity will decrease Outcome: Progressing   Problem:  Education: Goal: Knowledge of General Education information will improve Description: Including pain rating scale, medication(s)/side effects and non-pharmacologic comfort measures Outcome: Progressing   Problem: Health Behavior/Discharge Planning: Goal: Ability to manage health-related needs will improve Outcome: Progressing   Problem: Clinical Measurements: Goal: Ability to maintain clinical measurements within normal limits will improve Outcome: Progressing Goal: Will remain free from infection Outcome: Progressing Goal: Diagnostic test results will improve Outcome: Progressing Goal: Respiratory complications will improve Outcome: Progressing Goal: Cardiovascular complication will be avoided Outcome: Progressing   Problem: Activity: Goal: Risk for activity intolerance will decrease Outcome: Progressing   Problem: Nutrition: Goal: Adequate nutrition will be maintained Outcome: Progressing   Problem: Coping: Goal: Level of anxiety will decrease Outcome: Progressing   Problem: Elimination: Goal: Will not experience complications related to bowel motility Outcome: Progressing Goal: Will not experience complications related to urinary retention Outcome: Progressing   Problem: Pain Managment: Goal: General experience of comfort will improve and/or be controlled Outcome: Progressing   Problem: Safety: Goal: Ability to remain free from injury will improve Outcome: Progressing   Problem: Skin Integrity: Goal: Risk for impaired skin integrity will decrease Outcome: Progressing

## 2024-05-15 NOTE — Plan of Care (Signed)
  Problem: Fluid Volume: Goal: Hemodynamic stability will improve Outcome: Progressing   Problem: Clinical Measurements: Goal: Diagnostic test results will improve Outcome: Progressing   Problem: Respiratory: Goal: Ability to maintain adequate ventilation will improve Outcome: Progressing   Problem: Clinical Measurements: Goal: Cardiovascular complication will be avoided Outcome: Progressing   Problem: Activity: Goal: Risk for activity intolerance will decrease Outcome: Progressing

## 2024-05-16 DIAGNOSIS — N390 Urinary tract infection, site not specified: Secondary | ICD-10-CM | POA: Diagnosis not present

## 2024-05-16 DIAGNOSIS — A419 Sepsis, unspecified organism: Secondary | ICD-10-CM | POA: Diagnosis not present

## 2024-05-16 LAB — GLUCOSE, CAPILLARY: Glucose-Capillary: 108 mg/dL — ABNORMAL HIGH (ref 70–99)

## 2024-05-16 LAB — CULTURE, BLOOD (ROUTINE X 2): Special Requests: ADEQUATE

## 2024-05-16 LAB — CBC
HCT: 45.9 % (ref 39.0–52.0)
Hemoglobin: 14.9 g/dL (ref 13.0–17.0)
MCH: 26.4 pg (ref 26.0–34.0)
MCHC: 32.5 g/dL (ref 30.0–36.0)
MCV: 81.4 fL (ref 80.0–100.0)
Platelets: 411 K/uL — ABNORMAL HIGH (ref 150–400)
RBC: 5.64 MIL/uL (ref 4.22–5.81)
RDW: 17.2 % — ABNORMAL HIGH (ref 11.5–15.5)
WBC: 13.3 K/uL — ABNORMAL HIGH (ref 4.0–10.5)
nRBC: 0 % (ref 0.0–0.2)

## 2024-05-16 MED ORDER — LACTINEX PO CHEW: 1.0000 | CHEWABLE_TABLET | Freq: Three times a day (TID) | ORAL | 0 refills | Status: AC

## 2024-05-16 MED ORDER — METHOCARBAMOL 500 MG PO TABS: 500.0000 mg | ORAL_TABLET | Freq: Three times a day (TID) | ORAL | 0 refills | Status: AC | PRN

## 2024-05-16 MED ORDER — SULFAMETHOXAZOLE-TRIMETHOPRIM 800-160 MG PO TABS: 1.0000 | ORAL_TABLET | Freq: Two times a day (BID) | ORAL | 0 refills | Status: AC

## 2024-05-16 NOTE — Plan of Care (Signed)

## 2024-05-16 NOTE — Discharge Summary (Signed)
 Physician Discharge Summary   Patient: Mason [name] MRN: [id number] DOB: 05/14/1946  Admit date:     05/12/2024  Discharge date: 05/16/24  Discharge Physician: Adriana DELENA Grams   PCP: Cleatus Arlyss RAMAN, MD   Recommendations at discharge:   Continue course of antibiotics Monitor blood pressure closely, BP medication may be restarted as blood pressure stabilizes Follow-up closely with palliative care  Discharge Diagnoses: Principal Problem:   Sepsis [facility]) Active Problems:   Atrial fibrillation (HCC)   Ambulatory dysfunction   Anxiety state   BPH (benign prostatic hyperplasia)   History of urinary retention   GERD (gastroesophageal reflux disease)   Glaucoma   Irritable bowel syndrome   Depression   Stroke (cerebrum) (HCC)   History of hemorrhagic stroke with residual hemiplegia (HCC)   Essential hypertension  Resolved Problems:   Sepsis [facility])  Hospital Course: Mason [name] is a 78 yo male with pmhx significant for depression, anxiety, GERD, IBS, skin cancer, HTN, CVA, afib (on Eliquis ).  Pt resides at [facility] due to his CVA -hemiplegia, chronic right-sided weakness. The facility suspected UTI due to dysuria sent a urine sample for cx on 9/16.  It came back today growing over > 100 K colony forming units.  It did not specify which bacteria.    Picture of cx results in media section as it's MDRO.   He apparently has an indwelling FC, but pulled it out today.  Per ED report patient was febrile, and started vomiting today. Poor historian very few words  ED Course: Blood pressure 113/66, pulse 89, temperature (!) 103.3 F (39.6 C), temperature source Rectal, resp. rate 19, weight 71.7 kg, SpO2 98%. LABs: WBC; 15.5, platelets of 522, lactic acid 1.2, UA: Hazy, large Hgb, large leukocyte esterase, 30 protein, bacteria rare, WBC >50 CXR-no acute cardiopulmonary process, questionable small nodule  Patient was noted for sepsis:  likely from urine.  Treated with IV  flagyl , maxipime , and vancomycin .    No other source was identified - Urinary retention: Foley catheter was placed   Disposition: Skilled nursing facility Diet recommendation:  Discharge Diet Orders (From admission, onward)     Start     Ordered   05/16/24 0000  Diet - low sodium heart healthy        05/16/24 1115           Regular diet DISCHARGE MEDICATION: Allergies as of 05/16/2024       Reactions   Shellfish Allergy Shortness Of Breath, Rash, Other (See Comments)   GI upset Difficulty breathing Shrimp and crab   Celexa  [citalopram ] Other (See Comments)   Aggression    Levsin [hyoscyamine] Other (See Comments)   Urinary retention   Librax [chlordiazepoxide-clidinium] Other (See Comments)   Urinary retention   Ak-mycin [erythromycin] Other (See Comments)   GI upset   Mobic  [meloxicam ] Other (See Comments)   GI upset   Penicillins Itching, Rash, Dermatitis   Received Unasyn  earlier without acute reaction        Medication List     STOP taking these medications    famotidine  20 MG tablet Commonly known as: PEPCID    losartan  25 MG tablet Commonly known as: COZAAR        TAKE these medications    amLODipine  2.5 MG tablet Commonly known as: NORVASC  Take 2.5 mg by mouth daily.   Baclofen  5 MG Tabs Take 1 tablet by mouth 3 (three) times daily.   Eliquis  2.5 MG Tabs tablet Generic drug: apixaban  Take  2.5 mg by mouth 2 (two) times daily.   lactobacillus acidophilus & bulgar chewable tablet Chew 1 tablet by mouth 3 (three) times daily with meals for 10 days.   latanoprost  0.005 % ophthalmic solution Commonly known as: XALATAN  Place 1 drop into both eyes at bedtime.   melatonin 3 MG Tabs tablet Take 1 tablet (3 mg total) by mouth at bedtime as needed. What changed:  how much to take when to take this   methocarbamol  500 MG tablet Commonly known as: ROBAXIN  Take 1 tablet (500 mg total) by mouth every 8 (eight) hours as needed for muscle  spasms. What changed:  when to take this reasons to take this   nystatin  ointment Commonly known as: MYCOSTATIN  Apply 1 Application topically every 12 (twelve) hours as needed (chronic fungal rash). Apply to glans penis/scrotum   polyethylene glycol 17 g packet Commonly known as: MIRALAX  / GLYCOLAX  Take 17 g by mouth 2 (two) times daily. What changed: when to take this   pregabalin  50 MG capsule Commonly known as: LYRICA  Take 50 mg by mouth 2 (two) times daily.   Proctozone -HC 2.5 % rectal cream Generic drug: hydrocortisone  Place 1 Application rectally every 6 (six) hours as needed for hemorrhoids (after bowel movements).   senna-docusate 8.6-50 MG tablet Commonly known as: Senokot-S Take 1 tablet by mouth daily.   sulfamethoxazole -trimethoprim  800-160 MG tablet Commonly known as: BACTRIM  DS Take 1 tablet by mouth 2 (two) times daily for 5 days.   tamsulosin  0.4 MG Caps capsule Commonly known as: FLOMAX  Take 1 capsule (0.4 mg total) by mouth daily after supper. What changed: when to take this   timolol  0.25 % ophthalmic solution Commonly known as: TIMOPTIC  Place 1 drop into both eyes daily.        Discharge Exam: Filed Weights   05/14/24 1441 05/15/24 0319 05/16/24 0506  Weight: 81.9 kg 82.2 kg 78.9 kg        General:  AAO x 3,  cooperative, no distress;   HEENT:  Normocephalic, PERRL, otherwise with in Normal limits   Neuro:  Dense right-sided weakness, with a right hand contraction, History of CVA, right hemiplegic CNII-XII intact. ,    Lungs:   Clear to auscultation BL, Respirations unlabored,  No wheezes / crackles  Cardio:    S1/S2, RRR, No murmure, No Rubs or Gallops   Abdomen:  Soft, non-tender, bowel sounds active all four quadrants, no guarding or peritoneal signs.  Muscular  skeletal:  Right sided hemiplegia, dense right upper and lower extremity weakness with contraction of right hand  Limited exam -global generalized weaknesses - in bed,  able to move left upper and lower extremities,   2+ pulses,  symmetric, No pitting edema  Skin:  Dry, warm to touch, negative for any Rashes,  Wounds: Please see nursing documentation         Condition at discharge: fair  The results of significant diagnostics from this hospitalization (including imaging, microbiology, ancillary and laboratory) are listed below for reference.   Imaging Studies: DG Chest Port 1 View Result Date: 05/12/2024 CLINICAL DATA:  Sepsis. EXAM: PORTABLE CHEST 1 VIEW COMPARISON:  05/02/2023 FINDINGS: The lungs are clear without focal pneumonia, edema, pneumothorax or pleural effusion. Tiny nodule is seen immediately adjacent to the into the anterior right first rib, potentially related to the bony anatomy, but underlying right upper lobe pulmonary nodule could have this appearance. The cardiopericardial silhouette is within normal limits for size. No acute bony abnormality. Telemetry leads  overlie the chest. IMPRESSION: 1. No acute cardiopulmonary findings. 2. Tiny nodule in the right upper lobe may be related to the bony anatomy, but underlying pulmonary nodule could have this appearance. No pulmonary nodule seen in this region on chest CT of 04/26/2023. CT chest without contrast recommended to further evaluate. Electronically Signed   By: Camellia Candle M.D.   On: 05/12/2024 13:33    Microbiology: Results for orders placed or performed during the hospital encounter of 05/12/24  Resp panel by RT-PCR (RSV, Flu A&B, Covid) Anterior Nasal Swab     Status: None   Collection Time: 05/12/24 12:25 PM   Specimen: Anterior Nasal Swab  Result Value Ref Range Status   SARS Coronavirus 2 by RT PCR NEGATIVE NEGATIVE Final    Comment: (NOTE) SARS-CoV-2 target nucleic acids are NOT DETECTED.  The SARS-CoV-2 RNA is generally detectable in upper respiratory specimens during the acute phase of infection. The lowest concentration of SARS-CoV-2 viral copies this assay can detect  is 138 copies/mL. A negative result does not preclude SARS-Cov-2 infection and should not be used as the sole basis for treatment or other patient management decisions. A negative result may occur with  improper specimen collection/handling, submission of specimen other than nasopharyngeal swab, presence of viral mutation(s) within the areas targeted by this assay, and inadequate number of viral copies(<138 copies/mL). A negative result must be combined with clinical observations, patient history, and epidemiological information. The expected result is Negative.  Fact Sheet for Patients:  BloggerCourse.com  Fact Sheet for Healthcare Providers:  SeriousBroker.it  This test is no t yet approved or cleared by the [place]  FDA and  has been authorized for detection and/or diagnosis of SARS-CoV-2 by FDA under an Emergency Use Authorization (EUA). This EUA will remain  in effect (meaning this test can be used) for the duration of the COVID-19 declaration under Section 564(b)(1) of the Act, 21 U.S.C.section 360bbb-3(b)(1), unless the authorization is terminated  or revoked sooner.       Influenza A by PCR NEGATIVE NEGATIVE Final   Influenza B by PCR NEGATIVE NEGATIVE Final    Comment: (NOTE) The Xpert Xpress SARS-CoV-2/FLU/RSV plus assay is intended as an aid in the diagnosis of influenza from Nasopharyngeal swab specimens and should not be used as a sole basis for treatment. Nasal washings and aspirates are unacceptable for Xpert Xpress SARS-CoV-2/FLU/RSV testing.  Fact Sheet for Patients: BloggerCourse.com  Fact Sheet for Healthcare Providers: SeriousBroker.it  This test is not yet approved or cleared by the [place]  FDA and has been authorized for detection and/or diagnosis of SARS-CoV-2 by FDA under an Emergency Use Authorization (EUA). This EUA will remain in effect  (meaning this test can be used) for the duration of the COVID-19 declaration under Section 564(b)(1) of the Act, 21 U.S.C. section 360bbb-3(b)(1), unless the authorization is terminated or revoked.     Resp Syncytial Virus by PCR NEGATIVE NEGATIVE Final    Comment: (NOTE) Fact Sheet for Patients: BloggerCourse.com  Fact Sheet for Healthcare Providers: SeriousBroker.it  This test is not yet approved or cleared by the [place]  FDA and has been authorized for detection and/or diagnosis of SARS-CoV-2 by FDA under an Emergency Use Authorization (EUA). This EUA will remain in effect (meaning this test can be used) for the duration of the COVID-19 declaration under Section 564(b)(1) of the Act, 21 U.S.C. section 360bbb-3(b)(1), unless the authorization is terminated or revoked.  Performed at [facility], [address], [place], [place] [address]  Blood Culture (routine x 2)     Status: Abnormal   Collection Time: 05/12/24  1:09 PM   Specimen: BLOOD  Result Value Ref Range Status   Specimen Description   Final    BLOOD LEFT ANTECUBITAL Performed at [facility], [address] [place], [place] [address]    Special Requests   Final    BOTTLES DRAWN AEROBIC AND ANAEROBIC Blood Culture adequate volume Performed at [facility], [address], [place], [place] [address]    Culture  Setup Time   Final    AEROBIC BOTTLE ONLY GRAM NEGATIVE RODS Gram Stain Report Called to,Read Back By and Verified With: G. ROOS RN 05/13/24 @0943  BY J.WHITE GRAM STAIN REVIEWED-AGREE WITH RESULT DRT    Culture (A)  Final    PROTEUS MIRABILIS SUSCEPTIBILITIES PERFORMED ON PREVIOUS CULTURE WITHIN THE LAST 5 DAYS. Performed at [facility], [address], [place], [place] [address]    Report Status 05/15/2024 FINAL  Final  Blood Culture (routine x 2)     Status: Abnormal   Collection Time: 05/12/24  1:09 PM   Specimen: BLOOD RIGHT HAND   Result Value Ref Range Status   Specimen Description   Final    BLOOD RIGHT HAND Performed at [facility], [address], [place], [place] [address]    Special Requests   Final    BOTTLES DRAWN AEROBIC AND ANAEROBIC Blood Culture adequate volume Performed at [facility], [address], [place], [place] [address]    Culture  Setup Time   Final    AEROBIC BOTTLE ONLY GRAM NEGATIVE RODS Gram Stain Report Called to,Read Back By and Verified With: G ROOS RN 05/13/24 @0942  BY J. WHITE Organism ID to follow CRITICAL RESULT CALLED TO, READ BACK BY AND VERIFIED WITH: PHARMD LORI POOLE ON 05/13/24 @ 1430 BY DRT Performed at [facility], [address] [place], [place] [address]    Culture PROTEUS MIRABILIS (A)  Final   Report Status 05/16/2024 FINAL  Final   Organism ID, Bacteria PROTEUS MIRABILIS  Final      Susceptibility   Proteus mirabilis - MIC*    AMPICILLIN  >=32 RESISTANT Resistant     CEFAZOLIN (NON-URINE) >=32 RESISTANT Resistant     CEFEPIME  <=0.12 SENSITIVE Sensitive     ERTAPENEM <=0.12 SENSITIVE Sensitive     CEFTRIAXONE  0.5 SENSITIVE Sensitive     CIPROFLOXACIN  >=4 RESISTANT Resistant     GENTAMICIN <=1 SENSITIVE Sensitive     MEROPENEM <=0.25 SENSITIVE Sensitive     TRIMETH /SULFA  <=20 SENSITIVE Sensitive     AMPICILLIN /SULBACTAM 16 INTERMEDIATE Intermediate     PIP/TAZO Value in next row Sensitive      <=4 SENSITIVEThis is a modified FDA-approved test that has been validated and its performance characteristics determined by the reporting laboratory.  This laboratory is certified under the Clinical Laboratory Improvement Amendments CLIA as qualified to perform high complexity clinical laboratory testing.    * PROTEUS MIRABILIS  Blood Culture ID Panel (Reflexed)     Status: Abnormal   Collection Time: 05/12/24  1:09 PM  Result Value Ref Range Status   Enterococcus faecalis NOT DETECTED NOT DETECTED Final   Enterococcus Faecium NOT DETECTED NOT DETECTED Final    Listeria monocytogenes NOT DETECTED NOT DETECTED Final   Staphylococcus species NOT DETECTED NOT DETECTED Final   Staphylococcus aureus (BCID) NOT DETECTED NOT DETECTED Final   Staphylococcus epidermidis NOT DETECTED NOT DETECTED Final   Staphylococcus lugdunensis NOT  DETECTED NOT DETECTED Final   Streptococcus species NOT DETECTED NOT DETECTED Final   Streptococcus agalactiae NOT DETECTED NOT DETECTED Final   Streptococcus pneumoniae NOT DETECTED NOT DETECTED Final   Streptococcus pyogenes NOT DETECTED NOT DETECTED Final   A.calcoaceticus-baumannii NOT DETECTED NOT DETECTED Final   Bacteroides fragilis NOT DETECTED NOT DETECTED Final   Enterobacterales DETECTED (A) NOT DETECTED Final    Comment: Enterobacterales represent a large order of gram negative bacteria, not a single organism. CRITICAL RESULT CALLED TO, READ BACK BY AND VERIFIED WITH: PHARMD LORI POOLE ON 05/13/24 @ 1430 BY DRT    Enterobacter cloacae complex NOT DETECTED NOT DETECTED Final   Escherichia coli NOT DETECTED NOT DETECTED Final   Klebsiella aerogenes NOT DETECTED NOT DETECTED Final   Klebsiella oxytoca NOT DETECTED NOT DETECTED Final   Klebsiella pneumoniae NOT DETECTED NOT DETECTED Final   Proteus species DETECTED (A) NOT DETECTED Final    Comment: CRITICAL RESULT CALLED TO, READ BACK BY AND VERIFIED WITH: PHARMD LORI POOLE ON 05/13/24 @ 1430 BY DRT    Salmonella species NOT DETECTED NOT DETECTED Final   Serratia marcescens NOT DETECTED NOT DETECTED Final   Haemophilus influenzae NOT DETECTED NOT DETECTED Final   Neisseria meningitidis NOT DETECTED NOT DETECTED Final   Pseudomonas aeruginosa NOT DETECTED NOT DETECTED Final   Stenotrophomonas maltophilia NOT DETECTED NOT DETECTED Final   Candida albicans NOT DETECTED NOT DETECTED Final   Candida auris NOT DETECTED NOT DETECTED Final   Candida glabrata NOT DETECTED NOT DETECTED Final   Candida krusei NOT DETECTED NOT DETECTED Final   Candida parapsilosis NOT  DETECTED NOT DETECTED Final   Candida tropicalis NOT DETECTED NOT DETECTED Final   Cryptococcus neoformans/gattii NOT DETECTED NOT DETECTED Final   CTX-M ESBL NOT DETECTED NOT DETECTED Final   Carbapenem resistance IMP NOT DETECTED NOT DETECTED Final   Carbapenem resistance KPC NOT DETECTED NOT DETECTED Final   Carbapenem resistance NDM NOT DETECTED NOT DETECTED Final   Carbapenem resist OXA 48 LIKE NOT DETECTED NOT DETECTED Final   Carbapenem resistance VIM NOT DETECTED NOT DETECTED Final    Comment: Performed at [facility], [address] [place], [place] [address]  Urine Culture     Status: Abnormal   Collection Time: 05/12/24  3:08 PM   Specimen: Urine, Random  Result Value Ref Range Status   Specimen Description   Final    URINE, RANDOM Performed at [facility], [address], [place], [place] [address]    Special Requests   Final    NONE Reflexed from [id number] Performed at [facility], [address], [place], [place] [address]    Culture (A)  Final    <10,000 COLONIES/mL INSIGNIFICANT GROWTH Performed at [facility], [address] [place], [place] [address]    Report Status 05/13/2024 FINAL  Final  MRSA Next Gen by PCR, Nasal     Status: None   Collection Time: 05/12/24  6:25 PM   Specimen: Nasal Mucosa; Nasal Swab  Result Value Ref Range Status   MRSA by PCR Next Gen NOT DETECTED NOT DETECTED Final    Comment: (NOTE) The GeneXpert MRSA Assay (FDA approved for NASAL specimens only), is one component of a comprehensive MRSA colonization surveillance program. It is not intended to diagnose MRSA infection nor to guide or monitor treatment for MRSA infections. Test performance is not FDA approved in patients less than 26 years old. Performed at [facility], [address] [place],  [place] [address]   Culture, blood (Routine X 2) w Reflex to ID Panel     Status: None (Preliminary result)   Collection Time: 05/14/24  3:25 PM   Specimen: BLOOD  Result  Value Ref Range Status   Specimen Description BLOOD BLOOD LEFT ARM LAC  Final   Special Requests   Final    BOTTLES DRAWN AEROBIC AND ANAEROBIC Blood Culture adequate volume   Culture   Final    NO GROWTH 2 DAYS Performed at [facility], [address] [place], [place] [address]    Report Status PENDING  Incomplete  Culture, blood (Routine X 2) w Reflex to ID Panel     Status: None (Preliminary result)   Collection Time: 05/14/24  3:25 PM   Specimen: BLOOD  Result Value Ref Range Status   Specimen Description BLOOD BLOOD LEFT ARM LT OUTTER AC  Final   Special Requests   Final    BOTTLES DRAWN AEROBIC AND ANAEROBIC Blood Culture adequate volume   Culture   Final    NO GROWTH 2 DAYS Performed at [facility] [address], [place], [place] [address]    Report Status PENDING  Incomplete    Labs: CBC: Recent Labs  Lab 05/12/24 1309 05/13/24 0336 05/14/24 0449 05/15/24 0434 05/16/24 0427  WBC 15.5* 19.0* 13.7* 13.2* 13.3*  NEUTROABS 14.0*  --   --   --   --   HGB 17.3* 15.2 13.4 14.2 14.9  HCT 54.3* 48.5 41.4 43.7 45.9  MCV 82.4 85.2 82.8 83.1 81.4  PLT 522* 462* 361 370 411*   Basic Metabolic Panel: Recent Labs  Lab 05/12/24 1309 05/12/24 1551 05/13/24 0336 05/14/24 0449  NA 137  --  138 134*  K 4.1  --  4.6 3.9  CL 103  --  105 104  CO2 23  --  26 24  GLUCOSE 88  --  99 91  BUN 20  --  18 15  CREATININE 1.08  --  1.17 0.77  CALCIUM 8.8*  --  7.9* 7.7*  MG  --  1.8  --   --   PHOS  --  1.8*  --   --    Liver Function Tests: Recent Labs  Lab 05/12/24 1309 05/13/24 0336 05/14/24 0449  AST 21 31 53*  ALT 15 23 50*  ALKPHOS 110 69 69  BILITOT 0.9 0.8 0.6  PROT 7.1 6.1* 5.4*  ALBUMIN 3.3* 2.6* 2.4*   CBG: Recent Labs  Lab 05/13/24 0732 05/15/24 0750 05/16/24 0713  GLUCAP 94 109* 108*    Discharge time spent: greater than 45  minutes.  Signed: Adriana DELENA Grams, MD [facility] 05/16/2024

## 2024-05-16 NOTE — TOC Transition Note (Signed)
 Transition of Care [facility]) - Discharge Note   Patient Details  Name: Mason [name] MRN: [id number] Date of Birth: [birth year]  Transition of Care [facility]) CM/SW Contact:  Lucie Lunger, LCSWA Phone Number: 05/16/2024, 12:20 PM  Clinical Narrative:    CSW updated that pt is medically stable for D/C back to [facility]. CSW spoke to Ozell with SNF who states they are ready for pt to return. CSW left secure VM for pts son providing update for D/C. CSW provided RN with room and report. D/C clinicals sent to facility via HUB. Med necessity printed to floor for RN. CSW to call EMS once RN is ready. TOC signing off.   Final next level of care: Skilled Nursing Facility Barriers to Discharge: Barriers Resolved   Patient Goals and CMS Choice Patient states their goals for this hospitalization and ongoing recovery are:: Return to [facility].gov Compare Post Acute Care list provided to:: Patient Choice offered to / list presented to : Patient      Discharge Placement              Patient chooses bed at: [facility] Patient to be transferred to facility by: EMS Name of family member notified: Secure VM left for son Patient and family notified of of transfer: 05/16/24  Discharge Plan and Services Additional resources added to the After Visit Summary for   In-house Referral: Clinical Social Work Discharge Planning Services: CM Consult Post Acute Care Choice: Nursing Home                               Social Drivers of Health (SDOH) Interventions SDOH Screenings   Food Insecurity: No Food Insecurity (05/12/2024)  Housing: Low Risk  (05/15/2024)  Transportation Needs: No Transportation Needs (05/12/2024)  Utilities: Not At Risk (05/12/2024)  Alcohol Screen: Low Risk  (07/08/2021)  Depression (PHQ2-9): Low Risk  (04/14/2023)  Financial Resource Strain: Low Risk  (07/20/2022)  Physical Activity: Insufficiently Active (07/20/2022)  Social Connections: Unknown  (05/12/2024)  Stress: No Stress Concern Present (07/20/2022)  Tobacco Use: Medium Risk (05/12/2024)     Readmission Risk Interventions    05/15/2024    1:16 PM  Readmission Risk Prevention Plan  Transportation Screening Complete  Home Care Screening Complete  Medication Review (RN CM) Complete

## 2024-05-16 NOTE — Plan of Care (Signed)
  Problem: Activity: Goal: Risk for activity intolerance will decrease Outcome: Progressing   Problem: Nutrition: Goal: Adequate nutrition will be maintained Outcome: Progressing   Problem: Activity: Goal: Risk for activity intolerance will decrease Outcome: Progressing   Problem: Safety: Goal: Ability to remain free from injury will improve Outcome: Progressing

## 2024-05-16 NOTE — Progress Notes (Signed)
 Called report to [place] at [facility]. Pt is off the floor; foley emptied prior to transport.

## 2024-05-19 ENCOUNTER — Ambulatory Visit: Payer: Self-pay | Admitting: Family Medicine

## 2024-05-19 LAB — CULTURE, BLOOD (ROUTINE X 2)
Culture: NO GROWTH
Culture: NO GROWTH
Special Requests: ADEQUATE
Special Requests: ADEQUATE

## 2024-06-24 ENCOUNTER — Ambulatory Visit: Admitting: Urology

## 2024-06-25 ENCOUNTER — Other Ambulatory Visit: Payer: Self-pay

## 2024-06-25 ENCOUNTER — Emergency Department (HOSPITAL_COMMUNITY)

## 2024-06-25 ENCOUNTER — Inpatient Hospital Stay (HOSPITAL_COMMUNITY)
Admission: EM | Admit: 2024-06-25 | Discharge: 2024-07-05 | DRG: 698 | Disposition: A | Discharge status: Skilled Nursing Facility | Source: Skilled Nursing Facility | Attending: Internal Medicine | Admitting: Internal Medicine

## 2024-06-25 ENCOUNTER — Encounter (HOSPITAL_COMMUNITY): Payer: Self-pay | Admitting: Emergency Medicine

## 2024-06-25 DIAGNOSIS — T83511A Infection and inflammatory reaction due to indwelling urethral catheter, initial encounter: Principal | ICD-10-CM | POA: Diagnosis present

## 2024-06-25 DIAGNOSIS — Z515 Encounter for palliative care: Secondary | ICD-10-CM | POA: Diagnosis not present

## 2024-06-25 DIAGNOSIS — G9340 Encephalopathy, unspecified: Secondary | ICD-10-CM | POA: Diagnosis not present

## 2024-06-25 DIAGNOSIS — N170 Acute kidney failure with tubular necrosis: Secondary | ICD-10-CM | POA: Diagnosis present

## 2024-06-25 DIAGNOSIS — Z800 Family history of malignant neoplasm of digestive organs: Secondary | ICD-10-CM

## 2024-06-25 DIAGNOSIS — N401 Enlarged prostate with lower urinary tract symptoms: Secondary | ICD-10-CM | POA: Diagnosis present

## 2024-06-25 DIAGNOSIS — Z79899 Other long term (current) drug therapy: Secondary | ICD-10-CM | POA: Diagnosis not present

## 2024-06-25 DIAGNOSIS — I480 Paroxysmal atrial fibrillation: Secondary | ICD-10-CM | POA: Diagnosis present

## 2024-06-25 DIAGNOSIS — J189 Pneumonia, unspecified organism: Secondary | ICD-10-CM | POA: Diagnosis present

## 2024-06-25 DIAGNOSIS — B964 Proteus (mirabilis) (morganii) as the cause of diseases classified elsewhere: Secondary | ICD-10-CM | POA: Diagnosis present

## 2024-06-25 DIAGNOSIS — Z823 Family history of stroke: Secondary | ICD-10-CM

## 2024-06-25 DIAGNOSIS — I10 Essential (primary) hypertension: Secondary | ICD-10-CM | POA: Diagnosis present

## 2024-06-25 DIAGNOSIS — R7881 Bacteremia: Secondary | ICD-10-CM | POA: Diagnosis not present

## 2024-06-25 DIAGNOSIS — K5732 Diverticulitis of large intestine without perforation or abscess without bleeding: Secondary | ICD-10-CM | POA: Diagnosis present

## 2024-06-25 DIAGNOSIS — N390 Urinary tract infection, site not specified: Secondary | ICD-10-CM | POA: Diagnosis present

## 2024-06-25 DIAGNOSIS — Z881 Allergy status to other antibiotic agents status: Secondary | ICD-10-CM

## 2024-06-25 DIAGNOSIS — Z8249 Family history of ischemic heart disease and other diseases of the circulatory system: Secondary | ICD-10-CM | POA: Diagnosis not present

## 2024-06-25 DIAGNOSIS — I69320 Aphasia following cerebral infarction: Secondary | ICD-10-CM

## 2024-06-25 DIAGNOSIS — G9341 Metabolic encephalopathy: Secondary | ICD-10-CM | POA: Diagnosis present

## 2024-06-25 DIAGNOSIS — R338 Other retention of urine: Secondary | ICD-10-CM | POA: Diagnosis present

## 2024-06-25 DIAGNOSIS — Z01818 Encounter for other preprocedural examination: Secondary | ICD-10-CM

## 2024-06-25 DIAGNOSIS — R6521 Severe sepsis with septic shock: Secondary | ICD-10-CM | POA: Diagnosis present

## 2024-06-25 DIAGNOSIS — I4891 Unspecified atrial fibrillation: Secondary | ICD-10-CM | POA: Diagnosis present

## 2024-06-25 DIAGNOSIS — N400 Enlarged prostate without lower urinary tract symptoms: Secondary | ICD-10-CM | POA: Diagnosis present

## 2024-06-25 DIAGNOSIS — I69359 Hemiplegia and hemiparesis following cerebral infarction affecting unspecified side: Secondary | ICD-10-CM

## 2024-06-25 DIAGNOSIS — R6520 Severe sepsis without septic shock: Secondary | ICD-10-CM | POA: Diagnosis not present

## 2024-06-25 DIAGNOSIS — D75839 Thrombocytosis, unspecified: Secondary | ICD-10-CM | POA: Diagnosis present

## 2024-06-25 DIAGNOSIS — Y846 Urinary catheterization as the cause of abnormal reaction of the patient, or of later complication, without mention of misadventure at the time of the procedure: Secondary | ICD-10-CM | POA: Diagnosis present

## 2024-06-25 DIAGNOSIS — A419 Sepsis, unspecified organism: Secondary | ICD-10-CM | POA: Diagnosis not present

## 2024-06-25 DIAGNOSIS — Z880 Allergy status to penicillin: Secondary | ICD-10-CM

## 2024-06-25 DIAGNOSIS — R319 Hematuria, unspecified: Secondary | ICD-10-CM | POA: Diagnosis not present

## 2024-06-25 DIAGNOSIS — Z66 Do not resuscitate: Secondary | ICD-10-CM | POA: Diagnosis present

## 2024-06-25 DIAGNOSIS — E872 Acidosis, unspecified: Secondary | ICD-10-CM | POA: Diagnosis present

## 2024-06-25 DIAGNOSIS — I69351 Hemiplegia and hemiparesis following cerebral infarction affecting right dominant side: Secondary | ICD-10-CM

## 2024-06-25 DIAGNOSIS — Z7901 Long term (current) use of anticoagulants: Secondary | ICD-10-CM | POA: Diagnosis not present

## 2024-06-25 DIAGNOSIS — Z7189 Other specified counseling: Secondary | ICD-10-CM | POA: Diagnosis not present

## 2024-06-25 DIAGNOSIS — Z803 Family history of malignant neoplasm of breast: Secondary | ICD-10-CM

## 2024-06-25 DIAGNOSIS — N179 Acute kidney failure, unspecified: Secondary | ICD-10-CM | POA: Diagnosis present

## 2024-06-25 DIAGNOSIS — Z85828 Personal history of other malignant neoplasm of skin: Secondary | ICD-10-CM

## 2024-06-25 DIAGNOSIS — I482 Chronic atrial fibrillation, unspecified: Secondary | ICD-10-CM | POA: Diagnosis present

## 2024-06-25 DIAGNOSIS — D72829 Elevated white blood cell count, unspecified: Secondary | ICD-10-CM | POA: Diagnosis not present

## 2024-06-25 DIAGNOSIS — A4102 Sepsis due to Methicillin resistant Staphylococcus aureus: Secondary | ICD-10-CM | POA: Diagnosis present

## 2024-06-25 DIAGNOSIS — Z91013 Allergy to seafood: Secondary | ICD-10-CM

## 2024-06-25 DIAGNOSIS — Z8052 Family history of malignant neoplasm of bladder: Secondary | ICD-10-CM

## 2024-06-25 DIAGNOSIS — R008 Other abnormalities of heart beat: Secondary | ICD-10-CM | POA: Diagnosis present

## 2024-06-25 DIAGNOSIS — Z23 Encounter for immunization: Secondary | ICD-10-CM | POA: Diagnosis present

## 2024-06-25 DIAGNOSIS — Z87891 Personal history of nicotine dependence: Secondary | ICD-10-CM | POA: Diagnosis not present

## 2024-06-25 DIAGNOSIS — N3001 Acute cystitis with hematuria: Secondary | ICD-10-CM

## 2024-06-25 DIAGNOSIS — Z978 Presence of other specified devices: Secondary | ICD-10-CM | POA: Diagnosis not present

## 2024-06-25 DIAGNOSIS — I493 Ventricular premature depolarization: Secondary | ICD-10-CM | POA: Diagnosis present

## 2024-06-25 DIAGNOSIS — B9562 Methicillin resistant Staphylococcus aureus infection as the cause of diseases classified elsewhere: Secondary | ICD-10-CM | POA: Diagnosis not present

## 2024-06-25 DIAGNOSIS — R4781 Slurred speech: Secondary | ICD-10-CM | POA: Diagnosis present

## 2024-06-25 LAB — URINALYSIS, W/ REFLEX TO CULTURE (INFECTION SUSPECTED)
Bilirubin Urine: NEGATIVE
Glucose, UA: NEGATIVE mg/dL
Ketones, ur: NEGATIVE mg/dL
Nitrite: POSITIVE — AB
Protein, ur: 100 mg/dL — AB
Specific Gravity, Urine: 1.02 (ref 1.005–1.030)
WBC, UA: >50 WBC/hpf (ref 0–5)
pH: 6 (ref 5.0–8.0)

## 2024-06-25 LAB — COMPREHENSIVE METABOLIC PANEL WITH GFR
ALT: 40 U/L (ref 0–44)
AST: 25 U/L (ref 15–41)
Albumin: 3 g/dL — ABNORMAL LOW (ref 3.5–5.0)
Alkaline Phosphatase: 116 U/L (ref 38–126)
Anion gap: 11 (ref 5–15)
BUN: 31 mg/dL — ABNORMAL HIGH (ref 8–23)
CO2: 27 mmol/L (ref 22–32)
Calcium: 8.9 mg/dL (ref 8.9–10.3)
Chloride: 96 mmol/L — ABNORMAL LOW (ref 98–111)
Creatinine, Ser: 1.56 mg/dL — ABNORMAL HIGH (ref 0.61–1.24)
GFR, Estimated: 45 mL/min — ABNORMAL LOW (ref >60)
Glucose, Bld: 121 mg/dL — ABNORMAL HIGH (ref 70–99)
Potassium: 5.1 mmol/L (ref 3.5–5.1)
Sodium: 133 mmol/L — ABNORMAL LOW (ref 135–145)
Total Bilirubin: 0.7 mg/dL (ref 0.0–1.2)
Total Protein: 6.8 g/dL (ref 6.5–8.1)

## 2024-06-25 LAB — CBC WITH DIFFERENTIAL/PLATELET
Abs Immature Granulocytes: 0.94 K/uL — ABNORMAL HIGH (ref 0.00–0.07)
Basophils Absolute: 0.2 K/uL — ABNORMAL HIGH (ref 0.0–0.1)
Basophils Relative: 1 %
Eosinophils Absolute: 0.4 K/uL (ref 0.0–0.5)
Eosinophils Relative: 1 %
HCT: 54.8 % — ABNORMAL HIGH (ref 39.0–52.0)
Hemoglobin: 17.3 g/dL — ABNORMAL HIGH (ref 13.0–17.0)
Immature Granulocytes: 2 %
Lymphocytes Relative: 3 %
Lymphs Abs: 1 K/uL (ref 0.7–4.0)
MCH: 26.5 pg (ref 26.0–34.0)
MCHC: 31.6 g/dL (ref 30.0–36.0)
MCV: 83.8 fL (ref 80.0–100.0)
Monocytes Absolute: 0.7 K/uL (ref 0.1–1.0)
Monocytes Relative: 2 %
Neutro Abs: 35.2 K/uL — ABNORMAL HIGH (ref 1.7–7.7)
Neutrophils Relative %: 91 %
Platelets: 574 K/uL — ABNORMAL HIGH (ref 150–400)
RBC: 6.54 MIL/uL — ABNORMAL HIGH (ref 4.22–5.81)
RDW: 20.4 % — ABNORMAL HIGH (ref 11.5–15.5)
Smear Review: NORMAL
WBC: 38.4 K/uL — ABNORMAL HIGH (ref 4.0–10.5)
nRBC: 0 % (ref 0.0–0.2)

## 2024-06-25 LAB — PROTIME-INR
INR: 1.6 — ABNORMAL HIGH (ref 0.8–1.2)
Prothrombin Time: 20.2 s — ABNORMAL HIGH (ref 11.4–15.2)

## 2024-06-25 LAB — LACTIC ACID, PLASMA
Lactic Acid, Venous: 3 mmol/L (ref 0.5–1.9)
Lactic Acid, Venous: 5 mmol/L (ref 0.5–1.9)
Lactic Acid, Venous: 2.9 mmol/L (ref 0.5–1.9)
Lactic Acid, Venous: 3.4 mmol/L (ref 0.5–1.9)

## 2024-06-25 MED ORDER — GUAIFENESIN-DM 100-10 MG/5ML PO SYRP
15.0000 mL | ORAL_SOLUTION | Freq: Three times a day (TID) | ORAL | Status: AC
  Administered 2024-06-25 – 2024-06-26 (×2): 15 mL via ORAL
  Filled 2024-06-25 (×3): qty 15

## 2024-06-25 MED ORDER — LACTATED RINGERS IV BOLUS
1000.0000 mL | Freq: Once | INTRAVENOUS | Status: AC
  Administered 2024-06-25: 1000 mL via INTRAVENOUS

## 2024-06-25 MED ORDER — LACTATED RINGERS IV BOLUS
500.0000 mL | Freq: Once | INTRAVENOUS | Status: AC
  Administered 2024-06-25: 500 mL via INTRAVENOUS

## 2024-06-25 MED ORDER — SODIUM CHLORIDE 0.9 % IV SOLN: INTRAVENOUS | Status: AC

## 2024-06-25 MED ORDER — SODIUM CHLORIDE 0.9 % IV SOLN: INTRAVENOUS | Status: DC

## 2024-06-25 MED ORDER — SODIUM CHLORIDE 0.9 % IV SOLN
500.0000 mg | INTRAVENOUS | Status: DC
  Administered 2024-06-25 – 2024-06-26 (×2): 500 mg via INTRAVENOUS
  Filled 2024-06-25 (×3): qty 5

## 2024-06-25 MED ORDER — ONDANSETRON HCL 4 MG/2ML IJ SOLN: 4.0000 mg | Freq: Four times a day (QID) | INTRAMUSCULAR | Status: DC | PRN

## 2024-06-25 MED ORDER — POLYETHYLENE GLYCOL 3350 17 G PO PACK: 17.0000 g | PACK | Freq: Every day | ORAL | Status: DC | PRN

## 2024-06-25 MED ORDER — TRAZODONE HCL 50 MG PO TABS
50.0000 mg | ORAL_TABLET | Freq: Every evening | ORAL | Status: DC | PRN
  Administered 2024-06-26 – 2024-07-02 (×6): 50 mg via ORAL
  Filled 2024-06-25 (×6): qty 1

## 2024-06-25 MED ORDER — SERTRALINE HCL 25 MG PO TABS
25.0000 mg | ORAL_TABLET | Freq: Every day | ORAL | Status: DC
  Administered 2024-06-26 – 2024-07-05 (×9): 25 mg via ORAL
  Filled 2024-06-25 (×10): qty 1

## 2024-06-25 MED ORDER — SODIUM CHLORIDE 0.9 % IV SOLN
2.0000 g | Freq: Once | INTRAVENOUS | Status: AC
  Administered 2024-06-25: 2 g via INTRAVENOUS
  Filled 2024-06-25: qty 12.5

## 2024-06-25 MED ORDER — SODIUM CHLORIDE 0.9 % IV SOLN: 500.0000 mg | INTRAVENOUS | Status: DC

## 2024-06-25 MED ORDER — ACETAMINOPHEN 650 MG RE SUPP: 650.0000 mg | Freq: Four times a day (QID) | RECTAL | Status: DC | PRN

## 2024-06-25 MED ORDER — SODIUM CHLORIDE 0.9 % IV BOLUS
500.0000 mL | Freq: Once | INTRAVENOUS | Status: DC
Start: 2024-06-25 — End: 2024-06-25

## 2024-06-25 MED ORDER — ONDANSETRON HCL 4 MG PO TABS: 4.0000 mg | ORAL_TABLET | Freq: Four times a day (QID) | ORAL | Status: DC | PRN

## 2024-06-25 MED ORDER — ACETAMINOPHEN 325 MG PO TABS
650.0000 mg | ORAL_TABLET | Freq: Four times a day (QID) | ORAL | Status: DC | PRN
  Administered 2024-06-26 – 2024-07-02 (×3): 650 mg via ORAL
  Filled 2024-06-25 (×3): qty 2

## 2024-06-25 MED ORDER — SODIUM CHLORIDE 0.9 % IV BOLUS
500.0000 mL | Freq: Once | INTRAVENOUS | Status: AC
  Administered 2024-06-25: 500 mL via INTRAVENOUS

## 2024-06-25 MED ORDER — BACLOFEN 10 MG PO TABS
5.0000 mg | ORAL_TABLET | Freq: Three times a day (TID) | ORAL | Status: DC
  Administered 2024-06-26 – 2024-07-05 (×27): 5 mg via ORAL
  Filled 2024-06-25 (×29): qty 1

## 2024-06-25 MED ORDER — SODIUM CHLORIDE 0.9 % IV SOLN
2.0000 g | Freq: Two times a day (BID) | INTRAVENOUS | Status: DC
  Administered 2024-06-26 – 2024-06-27 (×4): 2 g via INTRAVENOUS
  Filled 2024-06-25 (×4): qty 12.5

## 2024-06-25 MED ORDER — METRONIDAZOLE 500 MG/100ML IV SOLN
500.0000 mg | Freq: Two times a day (BID) | INTRAVENOUS | Status: DC
  Administered 2024-06-26 (×3): 500 mg via INTRAVENOUS
  Filled 2024-06-25 (×3): qty 100

## 2024-06-25 NOTE — ED Provider Notes (Signed)
 Mason [name] Provider Note   CSN: [id number] Arrival date & time: 06/25/24  1328     Patient presents with: No chief complaint on file.   Mason [name] is a 78 y.o. male.   HPI Patient presents for elevated WBC.  Medical history includes HTN, IBS, anxiety, depression, GERD, BPH, atrial fibrillation, CVA.  He is prescribed Eliquis .  He was admitted 6 weeks ago for urosepsis.  Per EMS, he has a fever and increased WBC at her facility.  Although EMS reports that patient endorsed headache and right eye pain for the past 2 days, he denies any currently.  He denies any current areas of discomfort.  He has an indwelling Foley catheter which was reportedly exchanged earlier today.    Prior to Admission medications   Medication Sig Start Date End Date Taking? Authorizing Provider  amLODipine  (NORVASC ) 2.5 MG tablet Take 2.5 mg by mouth daily. 03/14/24   [provider]  Baclofen  5 MG TABS Take 1 tablet by mouth 3 (three) times daily. 03/14/24   [provider]  ELIQUIS  2.5 MG TABS tablet Take 2.5 mg by mouth 2 (two) times daily. 03/14/24   [provider]  latanoprost  (XALATAN ) 0.005 % ophthalmic solution Place 1 drop into both eyes at bedtime. 01/25/24   [provider]  melatonin 3 MG TABS tablet Take 1 tablet (3 mg total) by mouth at bedtime as needed. Patient taking differently: Take 6 mg by mouth at bedtime. 05/05/23   Samtani, Jai-Gurmukh, MD  methocarbamol  (ROBAXIN ) 500 MG tablet Take 1 tablet (500 mg total) by mouth every 8 (eight) hours as needed for muscle spasms. 05/16/24   ShahmehdiAdriana LABOR, MD  nystatin  ointment (MYCOSTATIN ) Apply 1 Application topically every 12 (twelve) hours as needed (chronic fungal rash). Apply to glans penis/scrotum    [provider]  polyethylene glycol (MIRALAX  / GLYCOLAX ) 17 g packet Take 17 g by mouth 2 (two) times daily. Patient taking differently: Take 17 g by mouth  daily. 05/05/23   Samtani, Jai-Gurmukh, MD  pregabalin  (LYRICA ) 50 MG capsule Take 50 mg by mouth 2 (two) times daily. 04/18/24   [provider]  PROCTOZONE -HC 2.5 % rectal cream Place 1 Application rectally every 6 (six) hours as needed for hemorrhoids (after bowel movements). 04/29/24   [provider]  senna-docusate (SENOKOT-S) 8.6-50 MG tablet Take 1 tablet by mouth daily.    [provider]  sertraline (ZOLOFT) 25 MG tablet Take 25 mg by mouth daily. 06/12/24   [provider]  tamsulosin  (FLOMAX ) 0.4 MG CAPS capsule Take 1 capsule (0.4 mg total) by mouth daily after supper. Patient taking differently: Take 0.4 mg by mouth at bedtime. 05/05/23   Samtani, Jai-Gurmukh, MD  timolol  (TIMOPTIC ) 0.25 % ophthalmic solution Place 1 drop into both eyes daily. 04/11/24   [provider]    Allergies: Shellfish allergy, Celexa  [citalopram ], Levsin [hyoscyamine], Librax [chlordiazepoxide-clidinium], Ak-mycin [erythromycin], Mobic  [meloxicam ], and Penicillins    Review of Systems  Constitutional:  Positive for fatigue and fever.  Neurological:  Positive for weakness (Generalized) and headaches.  All other systems reviewed and are negative.   Updated Vital Signs BP 108/76   Pulse (!) 104   Temp 99.5 F (37.5 C) (Rectal)   Resp 20   Ht 5' 10 (1.778 m)   Wt 78 kg   SpO2 94%   BMI 24.67 kg/m   Physical Exam Vitals and nursing note reviewed.  Constitutional:  General: He is not in acute distress.    Appearance: Normal appearance. He is well-developed. He is not ill-appearing, toxic-appearing or diaphoretic.  HENT:     Head: Normocephalic and atraumatic.     Right Ear: External ear normal.     Left Ear: External ear normal.     Nose: Nose normal.     Mouth/Throat:     Mouth: Mucous membranes are moist.  Eyes:     Extraocular Movements: Extraocular movements intact.     Conjunctiva/sclera: Conjunctivae normal.  Cardiovascular:     Rate and  Rhythm: Regular rhythm. Tachycardia present.     Heart sounds: No murmur heard. Pulmonary:     Effort: Pulmonary effort is normal. No respiratory distress.     Breath sounds: Normal breath sounds. No wheezing or rales.  Chest:     Chest wall: No tenderness.  Abdominal:     General: There is no distension.     Palpations: Abdomen is soft.     Tenderness: There is abdominal tenderness. There is no guarding or rebound.  Musculoskeletal:        General: No swelling. Normal range of motion.     Cervical back: Normal range of motion and neck supple.     Right lower leg: No edema.     Left lower leg: No edema.  Skin:    General: Skin is warm and dry.     Capillary Refill: Capillary refill takes less than 2 seconds.     Coloration: Skin is not jaundiced or pale.  Neurological:     Mental Status: He is alert. Mental status is at baseline.     Motor: Weakness present.  Psychiatric:        Mood and Affect: Mood normal.        Behavior: Behavior normal.     (all labs ordered are listed, but only abnormal results are displayed) Labs Reviewed  LACTIC ACID, PLASMA - Abnormal; Notable for the following components:      Result Value   Lactic Acid, Venous 3.0 (*)    All other components within normal limits  LACTIC ACID, PLASMA - Abnormal; Notable for the following components:   Lactic Acid, Venous 5.0 (*)    All other components within normal limits  COMPREHENSIVE METABOLIC PANEL WITH GFR - Abnormal; Notable for the following components:   Sodium 133 (*)    Chloride 96 (*)    Glucose, Bld 121 (*)    BUN 31 (*)    Creatinine, Ser 1.56 (*)    Albumin 3.0 (*)    GFR, Estimated 45 (*)    All other components within normal limits  CBC WITH DIFFERENTIAL/PLATELET - Abnormal; Notable for the following components:   WBC 38.4 (*)    RBC 6.54 (*)    Hemoglobin 17.3 (*)    HCT 54.8 (*)    RDW 20.4 (*)    Platelets 574 (*)    Neutro Abs 35.2 (*)    Basophils Absolute 0.2 (*)    Abs Immature  Granulocytes 0.94 (*)    All other components within normal limits  PROTIME-INR - Abnormal; Notable for the following components:   Prothrombin Time 20.2 (*)    INR 1.6 (*)    All other components within normal limits  URINALYSIS, W/ REFLEX TO CULTURE (INFECTION SUSPECTED) - Abnormal; Notable for the following components:   APPearance CLOUDY (*)    Hgb urine dipstick LARGE (*)    Protein, ur 100 (*)  Nitrite POSITIVE (*)    Leukocytes,Ua MODERATE (*)    Bacteria, UA MANY (*)    All other components within normal limits  CULTURE, BLOOD (ROUTINE X 2)  CULTURE, BLOOD (ROUTINE X 2)  URINE CULTURE  LACTIC ACID, PLASMA  LACTIC ACID, PLASMA    EKG: EKG Interpretation Date/Time:  Tuesday June 25 2024 13:59:49 EST Ventricular Rate:  111 PR Interval:  162 QRS Duration:  80 QT Interval:  306 QTC Calculation: 416 R Axis:   -53  Text Interpretation: Sinus tachycardia Consider left atrial enlargement Inferior infarct, old Confirmed by Melvenia Motto [phone]) on 06/25/2024 3:07:58 PM  Radiology: CT CHEST ABDOMEN PELVIS WO CONTRAST Result Date: 06/25/2024 EXAM: CT CHEST, ABDOMEN AND PELVIS WITHOUT CONTRAST 06/25/2024 06:38:36 PM TECHNIQUE: CT of the chest, abdomen and pelvis was performed without the administration of intravenous contrast. Multiplanar reformatted images are provided for review. Automated exposure control, iterative reconstruction, and/or weight based adjustment of the mA/kV was utilized to reduce the radiation dose to as low as reasonably achievable. COMPARISON: Chest radiograph 06/25/2024, CT chest 04/26/2023, CT abdomen and pelvis 10/09/2022. CLINICAL HISTORY: Sepsis. FINDINGS: CHEST: MEDIASTINUM AND LYMPH NODES: Heart and pericardium are unremarkable. The central airways are clear. No mediastinal, hilar or axillary lymphadenopathy. LUNGS AND PLEURA: Layering debris in the trachea. Mucous plugging in the lower lobes. Lower lobe bronchial wall thickening. Scarring in the lower  lobes. No pleural effusion or pneumothorax. ABDOMEN AND PELVIS: LIVER: The liver is unremarkable. GALLBLADDER AND BILE DUCTS: Gallbladder is unremarkable. No biliary ductal dilatation. SPLEEN: No acute abnormality. PANCREAS: No acute abnormality. ADRENAL GLANDS: No acute abnormality. KIDNEYS, URETERS AND BLADDER: Cord catheter and gas in the decompressed bladder. No stones in the kidneys or ureters. No hydronephrosis. No perinephric or periureteral stranding. GI AND BOWEL: Stomach demonstrates no acute abnormality. Extensive sigmoid colon diverticulosis. Trace stranding about the proximal sigmoid colon (cervical series 5 image 517). There is no bowel obstruction. REPRODUCTIVE ORGANS: Enlarged prostate. PERITONEUM AND RETROPERITONEUM: No ascites. No free air. VASCULATURE: Aorta is normal in caliber. ABDOMINAL AND PELVIS LYMPH NODES: No lymphadenopathy. BONES AND SOFT TISSUES: No acute osseous abnormality. No focal soft tissue abnormality. IMPRESSION: 1. Layering debris in the trachea with mucous plugging and lower lobe bronchial wall thickening, reflecting airway inflammation/infection. 2. Extensive left colon diverticulosis with mild sigmoid diverticulitis. Electronically signed by: Norman Gatlin MD 06/25/2024 06:54 PM EST RP Workstation: [id number]   CT Head Wo Contrast Result Date: 06/25/2024 CLINICAL DATA:  Headache x2 weeks. EXAM: CT HEAD WITHOUT CONTRAST TECHNIQUE: Contiguous axial images were obtained from the base of the skull through the vertex without intravenous contrast. RADIATION DOSE REDUCTION: This exam was performed according to the departmental dose-optimization program which includes automated exposure control, adjustment of the mA and/or kV according to patient size and/or use of iterative reconstruction technique. COMPARISON:  August 31, 2023 FINDINGS: Brain: There is generalized cerebral atrophy with widening of the extra-axial spaces and ventricular dilatation. There are areas of decreased  attenuation within the white matter tracts of the supratentorial brain, consistent with microvascular disease changes. 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 Vascular: No hyperdense vessel or unexpected calcification. Skull: Normal. Negative for fracture or focal lesion. Sinuses/Orbits: There is mild to moderate severity bilateral ethmoid sinus mucosal thickening. Other: None. IMPRESSION: 1. 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 2. 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 3. No acute intracranial abnormality. Electronically Signed  By: Suzen Dials M.D.   On: 06/25/2024 18:53   DG Chest Port 1 View Result Date: 06/25/2024 EXAM: 1 VIEW(S) XRAY OF THE CHEST 06/25/2024 03:04:00 PM COMPARISON: Chest x-ray 01/10/2024. Chest CT 01/24/2024. CLINICAL HISTORY: Questionable sepsis - evaluate for abnormality. FINDINGS: LUNGS AND PLEURA: Small nodular density seen on previous x-ray likely overlies the first rib end on today's study. No pulmonary edema. No pleural effusion. No pneumothorax. HEART AND MEDIASTINUM: No acute abnormality of the cardiac and mediastinal silhouettes. BONES AND SOFT TISSUES: No acute osseous abnormality. IMPRESSION: 1. No acute findings. 2. Small nodular density seen on previous x-ray likely overlies the first rib end on today's study. Follow-up chest CT should be considered. Electronically signed by: Greig Pique MD 06/25/2024 03:34 PM EST RP Workstation: [id number]     Procedures   Medications Ordered in the ED  azithromycin (ZITHROMAX) 500 mg in sodium chloride  0.9 % 250 mL IVPB (has no administration in time range)  lactated ringers  bolus 1,000 mL (0 mLs Intravenous Stopped 06/25/24 1640)  ceFEPIme  (MAXIPIME ) 2 g in sodium chloride  0.9 % 100 mL IVPB (0 g Intravenous Stopped 06/25/24 1640)  lactated  ringers  bolus 1,000 mL (0 mLs Intravenous Stopped 06/25/24 1725)  lactated ringers  bolus 500 mL (500 mLs Intravenous Bolus 06/25/24 1729)                                    Medical Decision Making Amount and/or Complexity of Data Reviewed Labs: ordered. Radiology: ordered.   This patient presents to the ED for concern of fever and leukocytosis, this involves an extensive number of treatment options, and is a complaint that carries with it a high risk of complications and morbidity.  The differential diagnosis includes sepsis, other infection, lab error, leukemia   Co morbidities / Chronic conditions that complicate the patient evaluation  HTN, IBS, anxiety, depression, GERD, BPH, atrial fibrillation, CVA   Additional history obtained:  Additional history obtained from EMR External records from outside source obtained and reviewed including N/A   Lab Tests:  I Ordered, and personally interpreted labs.  The pertinent results include: Leukocytosis and lactic acidosis are present consistent with sepsis; AKI is present.  Analysis is consistent with UTI.   Imaging Studies ordered:  I ordered imaging studies including chest x-ray, CT head, chest, abdomen, pelvis I independently visualized and interpreted imaging which showed lower lobe bronchial wall thickening consistent with airway inflammation/infection I agree with the radiologist interpretation   Cardiac Monitoring: / EKG:  The patient was maintained on a cardiac monitor.  I personally viewed and interpreted the cardiac monitored which showed an underlying rhythm of: Sinus rhythm   Problem List / ED Course / Critical interventions / Medication management  Patient presents for report of fever and elevated WBC at nursing facility.  He was recently hospitalized for urosepsis.  Vital signs on arrival notable for tachycardia.  On exam, patient appears to be sleeping but is easily awakened.  He does have a global weakness.  His  current breathing is unlabored.  SpO2 is in the low 90s on room air.  Although there is a report of recent headache and right eye pain, he denies any currently.  Right eye is normal in appearance.  No wheezing is appreciated on lung auscultation.  He does have some mild lower abdominal tenderness.  Foley catheter is in place and there is gross hematuria in Foley bag.  He does have a right hemibody weakness which he states is baseline.  This is confirmed on chart review.  Septic workup was initiated.  Based on prior urine cultures, cefepime  was ordered.  Patient's initial lactic acid was elevated.  He remained tachycardic.  Additional IV fluids was ordered.  Further lab work notable for leukocytosis and AKI.  Patient received 30 cc/kg of IV fluid.  He underwent CT imaging which did show possible airway inflammation/infection.  Azithromycin ordered for additional antibiotic coverage.  His repeat lactate showed increased.  However, on assessment, patient appears improved.  He is more interactive.  Blood pressure remains normal.  Will continue to trend lactic acid.  Patient was admitted for further management. I ordered medication including IV fluids and antibiotics for empiric treatment of sepsis Reevaluation of the patient after these medicines showed that the patient improved I have reviewed the patients home medicines and have made adjustments as needed  Social Determinants of Health:  Resides in nursing facility  CRITICAL CARE Performed by: Bernardino Fireman   Total critical care time: 34 minutes  Critical care time was exclusive of separately billable procedures and treating other patients.  Critical care was necessary to treat or prevent imminent or life-threatening deterioration.  Critical care was time spent personally by me on the following activities: development of treatment plan with patient and/or surrogate as well as nursing, discussions with consultants, evaluation of patient's response to  treatment, examination of patient, obtaining history from patient or surrogate, ordering and performing treatments and interventions, ordering and review of laboratory studies, ordering and review of radiographic studies, pulse oximetry and re-evaluation of patient's condition.     Final diagnoses:  Sepsis, due to unspecified organism, unspecified whether acute organ dysfunction present [facility])  Urinary tract infection with hematuria, site unspecified  AKI (acute kidney injury)    ED Discharge Orders     None          Fireman Bernardino, MD 06/25/24 1925

## 2024-06-25 NOTE — ED Notes (Signed)
 Both sets of blood cultures drawn before any ABX administration

## 2024-06-25 NOTE — ED Notes (Signed)
 Patient transported to CT

## 2024-06-25 NOTE — ED Notes (Signed)
Report given to Francella Solian RN.

## 2024-06-25 NOTE — ED Notes (Signed)
 Pt lethargic. Initially would not wake for name calling or tap. Pt moaning when rolled over for rectal temp. Pt was then a/o x 4 and stated his penis hurt a little bit. Dark blood noted from penis at times. Pt warm to touch.

## 2024-06-25 NOTE — ED Notes (Signed)
 Dr Pearlean aware of lactic 3.4

## 2024-06-25 NOTE — ED Triage Notes (Signed)
 Pt sent from [facility] for a WBC of 42 per & fever per EMS. Pt also has had a headache x2 weeks & right eye pain. Per EMS pt had a foley exchanged earlier today and had hematuria. SABRA

## 2024-06-25 NOTE — ED Notes (Signed)
 Called son to make aware of admission bed number per pt request

## 2024-06-25 NOTE — ED Notes (Signed)
 Pt resting. A/o. Nad. Awaiting admission bed.

## 2024-06-25 NOTE — H&P (Signed)
 History and Physical    Mason [name] FMW:[id number] DOB: June 29, 1946 DOA: 06/25/2024  PCP: Mason Arlyss RAMAN, MD   Patient coming from: Mason [name]  I have personally briefly reviewed patient's old medical records in [facility]  Chief Complaint: Headache, elevated white blood count.  HPI: Mason [name] is a 78 y.o. 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 Patient was brought to the ED reports of elevated white blood count of 42,000 and fever.  He also presented with headache, reports intermittent right eye pain, that self resolved, and has been going on for a long time, he saw an eye doctor and was told nothing was wrong with his eye.  He denies eye pain today.  He has a chronic indwelling Foley catheter, it was exchanged earlier today, and had blood.  Patient tells me prior to the exchange he did not have any blood in his urine.  Also reports a cough.  ED Course: Temperature max-99.5.  Heart rate 104-119.  Respiratory rate 13-26.  O2 sats 90 to 97% on room air.  Systolic blood pressure  92 to 132.  Lactic acidosis 3> 5> 2.9> 3.4. WBC 38.4. Creatinine elevated 1.56. UA with positive nitrites and leukocytes, many bacteria. Head CT-no acute abnormality  CT chest abdomen pelvis without contrast- Layering debris in the trachea with mucous plugging and lower lobe bronchial wall thickening, reflecting airway inflammation/infection. Extensive left colon diverticulosis with mild sigmoid diverticulitis. Blood and urine cultures obtained. IV fluid bolus given IV cefepime , and azithromycin started.  Review of Systems: As per HPI all other systems reviewed and negative.  Past Medical History:  Diagnosis Date   Actinic keratosis 04/02/2013   L sup scapula - bx proven   Actinic keratosis 04/02/2013   R med ant deltoid - bx proven   Allergy    Anal fissure    Anxiety     Basal cell carcinoma    back - treated in the past    Basal cell carcinoma 05/02/2009   left supratip, Mohs   Cancer (HCC)    basal cell removed x4   Cataract    bil eyes   Depression    Diverticulosis of colon    GERD (gastroesophageal reflux disease)    esophageal narrowing   Glaucoma    Hiatal hernia    IBS (irritable bowel syndrome)    Insomnia    Internal hemorrhoids     Past Surgical History:  Procedure Laterality Date   ANAL SPHINCTEROTOMY  03/1995   fistulotomy   APPENDECTOMY     BASAL CELL CARCINOMA EXCISION     on back   BICEPT TENODESIS Right 09/27/2004   decompression   COLONOSCOPY  04/1999   internal hemmroids,12-11-08 colon tics and hems only    ESOPHAGOGASTRODUODENOSCOPY  04/1999   with esophagitis and stricture   MOHS SURGERY  09/02/2008   L supratip of nose with flap,basal cell cancer [facility]/MOHS   MOUTH SURGERY  01/2020   TONGUE SURGERY  2017   had small place removed, noncancerous   TONSILLECTOMY AND ADENOIDECTOMY     remote   UPPER GASTROINTESTINAL ENDOSCOPY     WRIST SURGERY Right    WRIST SURGERY     scar tissue removed     reports that he quit smoking about 25 years ago. His smoking use included cigarettes and cigars. He has never used smokeless tobacco. He reports that he does not currently  use alcohol. He reports that he does not use drugs.  Allergies  Allergen Reactions   Shellfish Allergy Shortness Of Breath, Rash and Other (See Comments)    GI upset Difficulty breathing  Shrimp and crab   Celexa  [Citalopram ] Other (See Comments)    Aggression    Levsin [Hyoscyamine] Other (See Comments)    Urinary retention   Librax [Chlordiazepoxide-Clidinium] Other (See Comments)    Urinary retention   Ak-Mycin [Erythromycin] Other (See Comments)    GI upset   Mobic  [Meloxicam ] Other (See Comments)    GI upset   Penicillins Itching, Rash and Dermatitis    Received Unasyn  earlier without acute reaction    Family History  Problem  Relation Age of Onset   Hypertension Mother    Stroke Mother    Pneumonia Father    Bladder Cancer Father    Cancer Maternal Aunt        ? type   Breast cancer Maternal Aunt        mets   Esophageal cancer Maternal Aunt        aunt ? esophageal ca   Stomach cancer Maternal Aunt        aunt ? stmach ca   Colon cancer Maternal Aunt        had colostomy - not sure of origin   Cancer Maternal Grandmother        ? type   Prostate cancer Neg Hx    Rectal cancer Neg Hx    Prior to Admission medications   Medication Sig Start Date End Date Taking? Authorizing Provider  amLODipine  (NORVASC ) 2.5 MG tablet Take 2.5 mg by mouth daily. 03/14/24  Yes [provider]  Baclofen  5 MG TABS Take 1 tablet by mouth 3 (three) times daily. 03/14/24  Yes [provider]  cetirizine (ZYRTEC) 10 MG tablet Take 10 mg by mouth daily.   Yes [provider]  ELIQUIS  2.5 MG TABS tablet Take 2.5 mg by mouth 2 (two) times daily. 0800, 2000 03/14/24  Yes [provider]  guaiFENesin (MUCINEX) 600 MG 12 hr tablet Take 600 mg by mouth 2 (two) times daily. For 5 days 06/20/24 06/25/24 Yes [provider]  lactose free nutrition (BOOST) LIQD Take 240 mLs by mouth 3 (three) times daily between meals.   Yes [provider]  latanoprost  (XALATAN ) 0.005 % ophthalmic solution Place 1 drop into both eyes at bedtime. 01/25/24  Yes [provider]  melatonin 3 MG TABS tablet Take 1 tablet (3 mg total) by mouth at bedtime as needed. Patient taking differently: Take 3 mg by mouth at bedtime as needed (insomnia). 05/05/23  Yes Samtani, Jai-Gurmukh, MD  methocarbamol  (ROBAXIN ) 500 MG tablet Take 1 tablet (500 mg total) by mouth every 8 (eight) hours as needed for muscle spasms. 05/16/24  Yes Shahmehdi, Adriana LABOR, MD  nystatin  ointment (MYCOSTATIN ) Apply 1 Application topically every 12 (twelve) hours as needed (chronic fungal rash). Apply to glans penis/scrotum   Yes [provider]  polyethylene glycol (MIRALAX  / GLYCOLAX ) 17 g packet Take 17 g by mouth 2 (two) times daily. 05/05/23  Yes Samtani, Jai-Gurmukh, MD  pregabalin  (LYRICA ) 50 MG capsule Take 50 mg by mouth 2 (two) times daily. 04/18/24  Yes [provider]  PROCTOZONE -HC 2.5 % rectal cream Place 1 Application rectally every 6 (six) hours as needed for hemorrhoids (after bowel movements). 04/29/24  Yes [provider]  senna-docusate (SENOKOT-S) 8.6-50 MG tablet Take 1 tablet by  mouth daily.   Yes [provider]  sertraline (ZOLOFT) 25 MG tablet Take 25 mg by mouth daily. 06/12/24  Yes [provider]  tamsulosin  (FLOMAX ) 0.4 MG CAPS capsule Take 1 capsule (0.4 mg total) by mouth daily after supper. Patient taking differently: Take 0.4 mg by mouth every evening. 05/05/23  Yes Samtani, Jai-Gurmukh, MD  timolol  (TIMOPTIC ) 0.25 % ophthalmic solution Place 1 drop into both eyes daily. 04/11/24  Yes [provider]    Physical Exam: Vitals:   06/25/24 1845 06/25/24 1900 06/25/24 1945 06/25/24 2000  BP: 108/76 126/76  132/76  Pulse:  (!) 105  (!) 118  Resp: 20 20  (!) 26  Temp:  99.5 F (37.5 C)    TempSrc:  Oral    SpO2:  97% 93%   Weight:      Height:        Constitutional: NAD, calm, comfortable Vitals:   06/25/24 1845 06/25/24 1900 06/25/24 1945 06/25/24 2000  BP: 108/76 126/76  132/76  Pulse:  (!) 105  (!) 118  Resp: 20 20  (!) 26  Temp:  99.5 F (37.5 C)    TempSrc:  Oral    SpO2:  97% 93%   Weight:      Height:       Eyes: PERRL, lids and conjunctivae normal ENMT: Mucous membranes are moist.   Neck: normal, supple, no masses, no thyromegaly Respiratory: clear to auscultation bilaterally, no wheezing, no crackles. Normal respiratory effort. No accessory muscle use.  Cardiovascular: Regular rate and rhythm, no murmurs / rubs / gallops. No extremity edema.  Abdomen: no tenderness, no masses palpated. No hepatosplenomegaly.    Musculoskeletal: no clubbing / cyanosis. No joint deformity upper and lower extremities. Skin: no rashes, lesions, ulcers. No induration Neurologic: Baseline slurred speech, 0/5 strength to right upper extremity, 3/5 strength to lower extremity, 5 out of 5 strength with good grip strength to left upper and lower extremity.SABRA  Psychiatric: Awake and alert, able to answer questions appropriately but speech is slowed making comprehension challenging.   Labs on Admission: I have personally reviewed following labs and imaging studies  CBC: Recent Labs  Lab 06/25/24 1605  WBC 38.4*  NEUTROABS 35.2*  HGB 17.3*  HCT 54.8*  MCV 83.8  PLT 574*   Basic Metabolic Panel: Recent Labs  Lab 06/25/24 1605  NA 133*  K 5.1  CL 96*  CO2 27  GLUCOSE 121*  BUN 31*  CREATININE 1.56*  CALCIUM 8.9   GFR: Estimated Creatinine Clearance: 39.6 mL/min (A) (by C-G formula based on SCr of 1.56 mg/dL (H)). Liver Function Tests: Recent Labs  Lab 06/25/24 1605  AST 25  ALT 40  ALKPHOS 116  BILITOT 0.7  PROT 6.8  ALBUMIN 3.0*   No results for input(s): LIPASE, AMYLASE in the last 168 hours. No results for input(s): AMMONIA in the last 168 hours. Coagulation Profile: Recent Labs  Lab 06/25/24 1605  INR 1.6*   Urine analysis:    Component Value Date/Time   COLORURINE YELLOW 06/25/2024 1601   APPEARANCEUR CLOUDY (A) 06/25/2024 1601   LABSPEC 1.020 06/25/2024 1601   PHURINE 6.0 06/25/2024 1601   GLUCOSEU NEGATIVE 06/25/2024 1601   GLUCOSEU NEGATIVE 10/07/2022 0847   HGBUR LARGE (A) 06/25/2024 1601   HGBUR negative 01/01/2009 0952   BILIRUBINUR NEGATIVE 06/25/2024 1601   KETONESUR NEGATIVE 06/25/2024 1601   PROTEINUR 100 (A) 06/25/2024 1601   UROBILINOGEN 0.2 10/07/2022 0847   NITRITE POSITIVE (A) 06/25/2024 1601  LEUKOCYTESUR MODERATE (A) 06/25/2024 1601    Radiological Exams on Admission: CT CHEST ABDOMEN PELVIS WO CONTRAST Result Date: 06/25/2024 EXAM: CT CHEST, ABDOMEN  AND PELVIS WITHOUT CONTRAST 06/25/2024 06:38:36 PM TECHNIQUE: CT of the chest, abdomen and pelvis was performed without the administration of intravenous contrast. Multiplanar reformatted images are provided for review. Automated exposure control, iterative reconstruction, and/or weight based adjustment of the mA/kV was utilized to reduce the radiation dose to as low as reasonably achievable. COMPARISON: Chest radiograph 06/25/2024, CT chest 04/26/2023, CT abdomen and pelvis 10/09/2022. CLINICAL HISTORY: Sepsis. FINDINGS: CHEST: MEDIASTINUM AND LYMPH NODES: Heart and pericardium are unremarkable. The central airways are clear. No mediastinal, hilar or axillary lymphadenopathy. LUNGS AND PLEURA: Layering debris in the trachea. Mucous plugging in the lower lobes. Lower lobe bronchial wall thickening. Scarring in the lower lobes. No pleural effusion or pneumothorax. ABDOMEN AND PELVIS: LIVER: The liver is unremarkable. GALLBLADDER AND BILE DUCTS: Gallbladder is unremarkable. No biliary ductal dilatation. SPLEEN: No acute abnormality. PANCREAS: No acute abnormality. ADRENAL GLANDS: No acute abnormality. KIDNEYS, URETERS AND BLADDER: Cord catheter and gas in the decompressed bladder. No stones in the kidneys or ureters. No hydronephrosis. No perinephric or periureteral stranding. GI AND BOWEL: Stomach demonstrates no acute abnormality. Extensive sigmoid colon diverticulosis. Trace stranding about the proximal sigmoid colon (cervical series 5 image 517). There is no bowel obstruction. REPRODUCTIVE ORGANS: Enlarged prostate. PERITONEUM AND RETROPERITONEUM: No ascites. No free air. VASCULATURE: Aorta is normal in caliber. ABDOMINAL AND PELVIS LYMPH NODES: No lymphadenopathy. BONES AND SOFT TISSUES: No acute osseous abnormality. No focal soft tissue abnormality. IMPRESSION: 1. Layering debris in the trachea with mucous plugging and lower lobe bronchial wall thickening, reflecting airway inflammation/infection. 2. Extensive  left colon diverticulosis with mild sigmoid diverticulitis. Electronically signed by: Norman Gatlin MD 06/25/2024 06:54 PM EST RP Workstation: [id number]   CT Head Wo Contrast Result Date: 06/25/2024 CLINICAL DATA:  Headache x2 weeks. EXAM: CT HEAD WITHOUT CONTRAST TECHNIQUE: Contiguous axial images were obtained from the base of the skull through the vertex without intravenous contrast. RADIATION DOSE REDUCTION: This exam was performed according to the departmental dose-optimization program which includes automated exposure control, adjustment of the mA and/or kV according to patient size and/or use of iterative reconstruction technique. COMPARISON:  August 31, 2023 FINDINGS: Brain: There is generalized cerebral atrophy with widening of the extra-axial spaces and ventricular dilatation. There are areas of decreased attenuation within the white matter tracts of the supratentorial brain, consistent with microvascular disease changes. 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 Vascular: No hyperdense vessel or unexpected calcification. Skull: Normal. Negative for fracture or focal lesion. Sinuses/Orbits: There is mild to moderate severity bilateral ethmoid sinus mucosal thickening. Other: None. IMPRESSION: 1. 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 2. 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 3. No acute intracranial abnormality. Electronically Signed   By: Suzen Dials M.D.   On: 06/25/2024 18:53   DG Chest Port 1 View Result Date: 06/25/2024 EXAM: 1 VIEW(S) XRAY OF THE CHEST 06/25/2024 03:04:00 PM COMPARISON: Chest x-ray 01/10/2024. Chest CT 01/24/2024. CLINICAL HISTORY: Questionable sepsis - evaluate for abnormality. FINDINGS: LUNGS AND PLEURA: Small nodular density seen on previous x-ray likely overlies the first rib end on  today's study. No pulmonary edema. No pleural effusion. No pneumothorax. HEART AND MEDIASTINUM: No acute abnormality of the cardiac and mediastinal silhouettes. BONES AND SOFT TISSUES: No acute osseous abnormality. IMPRESSION:  1. No acute findings. 2. Small nodular density seen on previous x-ray likely overlies the first rib end on today's study. Follow-up chest CT should be considered. Electronically signed by: Greig Pique MD 06/25/2024 03:34 PM EST RP Workstation: [id number]   EKG: Independently reviewed.  Sinus rhythm, rate 111, QTc 416.  No change from prior.  Assessment/Plan Principal Problem:   Severe sepsis (HCC) Active Problems:   Atrial fibrillation (HCC)   UTI (urinary tract infection)   PNA (pneumonia)   AKI (acute kidney injury)   BPH (benign prostatic hyperplasia)   History of hemorrhagic stroke with residual hemiplegia (HCC)   Essential hypertension  Assessment and Plan:  Severe sepsis -meeting severe sepsis criteria with tachycardia heart rate 105-118, leukocytosis of 38.4.  With endorgan dysfunction-AKI, lactic acidosis- 3> 5> 2.9> 3.4, and initial encephalopathy that has improved -Total of 3 L bolus given, cont n/s 75cc/hr x 10hr - Trend lactic acid  Urinary tract infection-with severe sepsis.  Chronic indwelling Foley catheter.  UA with large leukocytes and nitrites.  Blood cultures 9/21 grew Proteus mirabilis-resistant to cefazolin. - IV cefepime  started in ED - Will confirm with pharmacy if broader spectrum antibiotic needed.  Pneumonia- with severe sepsis.  Possible aspiration considering CT chest findings- Layering debris in the trachea with mucous plugging and lower lobe bronchial wall thickening, reflecting airway inflammation/infection.  O2 sat greater than 91% on room air. - Swallow evaluation - Mucolytics - Continue IV cefepime  and azithromycin  Diverticulitis- Extensive left colon diverticulosis with mild sigmoid diverticulitis.  - Add IV metronidazole  to  cefepime  and azithromycin, IV fluids  AKI-creatinine 1.56.  In the setting of severe sepsis. - Hydrate.  Acute metabolic encephalopathy-likely from severe sepsis.  Patient initially quite lethargic on arrival, now responsive.  Head CT negative for acute abnormality. - Resume Zoloft, baclofen  for now - Pending clinical course resume Lyrica   Hematuria, BPH, chronic indwelling Foley-likely trauma to urethra with Foley insertion, he is on anticoagulation with Eliquis .  Urine bag draining urine, per Cord catheter and gas in decompressed bladder. - Hold Eliquis  for now, monitor. - Open tamsulosin  for now with hypotension, besides he has a Foley  Atrial fibrillation-EKG shows sinus rhythm.  Not on rate limiting meds.  On chronic anticoagulation with Eliquis . -Hold Eliquis .  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  ACP documents reviewed Family Communication: None at bedside Disposition Plan: > 2 days Consults called: None Admission status: Inpatient, telemetry I certify that at the point of admission it is my clinical judgment that the patient will require inpatient hospital care spanning beyond 2 midnights from the point of admission due to high intensity of service, high risk for further deterioration and high frequency of surveillance required.   CRITICAL CARE Performed by: Tully FORBES Carwin   Total critical care time: 70 minutes  Critical care time was exclusive of separately billable procedures and treating other patients.  Critical care was necessary to treat or prevent imminent or life-threatening deterioration.  Critical care was time spent personally by me on the following activities: development of treatment plan with patient and/or surrogate as well as nursing, discussions with consultants, evaluation of patient's  response to treatment, examination of patient, obtaining history from patient or surrogate, ordering and performing treatments and interventions, ordering and review of laboratory studies, ordering and review of radiographic studies, pulse oximetry and re-evaluation of patient's condition.  Author: Tully FORBES Carwin, MD 06/25/2024 8:45 PM  For on call review [email or web address].

## 2024-06-25 NOTE — Progress Notes (Signed)
 Samantha at [facility] updated on patients admission.

## 2024-06-25 NOTE — ED Notes (Signed)
 Resuming care as primary RN at this time

## 2024-06-25 NOTE — Progress Notes (Signed)
 Dr Pearlean Ejiroghene notified patient brought to room 338

## 2024-06-26 DIAGNOSIS — I4891 Unspecified atrial fibrillation: Secondary | ICD-10-CM | POA: Diagnosis not present

## 2024-06-26 DIAGNOSIS — A419 Sepsis, unspecified organism: Secondary | ICD-10-CM | POA: Diagnosis not present

## 2024-06-26 DIAGNOSIS — I10 Essential (primary) hypertension: Secondary | ICD-10-CM | POA: Diagnosis not present

## 2024-06-26 DIAGNOSIS — I480 Paroxysmal atrial fibrillation: Secondary | ICD-10-CM

## 2024-06-26 DIAGNOSIS — N3001 Acute cystitis with hematuria: Secondary | ICD-10-CM | POA: Diagnosis not present

## 2024-06-26 LAB — CBC
HCT: 43.3 % (ref 39.0–52.0)
Hemoglobin: 14.2 g/dL (ref 13.0–17.0)
MCH: 26.4 pg (ref 26.0–34.0)
MCHC: 32.8 g/dL (ref 30.0–36.0)
MCV: 80.5 fL (ref 80.0–100.0)
Platelets: 451 K/uL — ABNORMAL HIGH (ref 150–400)
RBC: 5.38 MIL/uL (ref 4.22–5.81)
RDW: 19.3 % — ABNORMAL HIGH (ref 11.5–15.5)
WBC: 25.5 K/uL — ABNORMAL HIGH (ref 4.0–10.5)
nRBC: 0 % (ref 0.0–0.2)

## 2024-06-26 LAB — BASIC METABOLIC PANEL WITH GFR
Anion gap: 9 (ref 5–15)
BUN: 27 mg/dL — ABNORMAL HIGH (ref 8–23)
CO2: 23 mmol/L (ref 22–32)
Calcium: 7.9 mg/dL — ABNORMAL LOW (ref 8.9–10.3)
Chloride: 101 mmol/L (ref 98–111)
Creatinine, Ser: 1.12 mg/dL (ref 0.61–1.24)
GFR, Estimated: >60 mL/min (ref >60)
Glucose, Bld: 111 mg/dL — ABNORMAL HIGH (ref 70–99)
Potassium: 4.2 mmol/L (ref 3.5–5.1)
Sodium: 133 mmol/L — ABNORMAL LOW (ref 135–145)

## 2024-06-26 LAB — BLOOD CULTURE ID PANEL (REFLEXED) - BCID2

## 2024-06-26 LAB — MRSA NEXT GEN BY PCR, NASAL: MRSA by PCR Next Gen: DETECTED — AB

## 2024-06-26 LAB — LACTIC ACID, PLASMA
Lactic Acid, Venous: 1.8 mmol/L (ref 0.5–1.9)
Lactic Acid, Venous: 2.3 mmol/L (ref 0.5–1.9)
Lactic Acid, Venous: 2.6 mmol/L (ref 0.5–1.9)

## 2024-06-26 MED ORDER — CHLORHEXIDINE GLUCONATE CLOTH 2 % EX PADS
6.0000 | MEDICATED_PAD | Freq: Every day | CUTANEOUS | Status: DC
  Administered 2024-06-26 – 2024-07-05 (×8): 6 via TOPICAL

## 2024-06-26 MED ORDER — LACTATED RINGERS IV BOLUS
500.0000 mL | Freq: Once | INTRAVENOUS | Status: AC
  Administered 2024-06-26: 500 mL via INTRAVENOUS

## 2024-06-26 MED ORDER — TAMSULOSIN HCL 0.4 MG PO CAPS
0.4000 mg | ORAL_CAPSULE | Freq: Every evening | ORAL | Status: DC
  Administered 2024-06-26 – 2024-07-05 (×10): 0.4 mg via ORAL
  Filled 2024-06-26 (×10): qty 1

## 2024-06-26 MED ORDER — DILTIAZEM HCL-DEXTROSE 125-5 MG/125ML-% IV SOLN (PREMIX)
5.0000 mg/h | INTRAVENOUS | Status: DC
  Administered 2024-06-26: 5 mg/h via INTRAVENOUS
  Filled 2024-06-26: qty 125

## 2024-06-26 MED ORDER — SODIUM CHLORIDE 0.9 % IV SOLN: 250.0000 mL | INTRAVENOUS | Status: AC

## 2024-06-26 MED ORDER — LATANOPROST 0.005 % OP SOLN
1.0000 [drp] | Freq: Every day | OPHTHALMIC | Status: DC
  Administered 2024-06-26 – 2024-07-04 (×9): 1 [drp] via OPHTHALMIC
  Filled 2024-06-26 (×2): qty 2.5

## 2024-06-26 MED ORDER — DILTIAZEM HCL 30 MG PO TABS
30.0000 mg | ORAL_TABLET | Freq: Three times a day (TID) | ORAL | Status: DC
  Administered 2024-06-26 – 2024-06-27 (×4): 30 mg via ORAL
  Filled 2024-06-26 (×5): qty 1

## 2024-06-26 MED ORDER — TIMOLOL MALEATE 0.25 % OP SOLN
1.0000 [drp] | Freq: Every day | OPHTHALMIC | Status: DC
  Administered 2024-06-26 – 2024-07-05 (×9): 1 [drp] via OPHTHALMIC
  Filled 2024-06-26: qty 5

## 2024-06-26 MED ORDER — NOREPINEPHRINE 4 MG/250ML-% IV SOLN
INTRAVENOUS | Status: AC
  Administered 2024-06-26: 2 ug/min via INTRAVENOUS
  Filled 2024-06-26: qty 250

## 2024-06-26 MED ORDER — APIXABAN 2.5 MG PO TABS
2.5000 mg | ORAL_TABLET | Freq: Two times a day (BID) | ORAL | Status: DC
  Administered 2024-06-26 – 2024-07-01 (×10): 2.5 mg via ORAL
  Filled 2024-06-26 (×11): qty 1

## 2024-06-26 MED ORDER — NOREPINEPHRINE 4 MG/250ML-% IV SOLN: 0.0000 ug/min | INTRAVENOUS | Status: DC

## 2024-06-26 MED ORDER — GUAIFENESIN-DM 100-10 MG/5ML PO SYRP
5.0000 mL | ORAL_SOLUTION | ORAL | Status: DC | PRN
  Filled 2024-06-26: qty 5

## 2024-06-26 MED ORDER — MUPIROCIN 2 % EX OINT
TOPICAL_OINTMENT | Freq: Two times a day (BID) | CUTANEOUS | Status: DC
  Administered 2024-06-29: 1 via NASAL
  Filled 2024-06-26 (×3): qty 22

## 2024-06-26 NOTE — NC FL2 (Signed)
 [place]  MEDICAID FL2 LEVEL OF CARE FORM     IDENTIFICATION  Patient Name: Mason [name] Birthdate: October 03, 1945 Sex: male Admission Date (Current Location): 06/25/2024  [facility] and [place] Number:  [employer] and Address:  [facility],  [address], [place] [address]      Provider Number: [id number]  Attending Physician Name and Address:  Ricky Fines, MD  Relative Name and Phone Number:       Current Level of Care: Hospital Recommended Level of Care: Skilled Nursing Facility Prior Approval Number:    Date Approved/Denied:   PASRR Number:    Discharge Plan: SNF    Current Diagnoses: Patient Active Problem List   Diagnosis Date Noted   Severe sepsis (HCC) 06/25/2024   UTI (urinary tract infection) 06/25/2024   PNA (pneumonia) 06/25/2024   AKI (acute kidney injury) 06/25/2024   Essential hypertension 05/12/2024   History of urinary retention 10/30/2023   History of elevated PSA 10/30/2023   Weakness of right side of body 06/05/2023   History of hemorrhagic stroke with residual hemiplegia (HCC) 05/30/2023   Glaucoma 05/30/2023   Former smoker 05/30/2023   Ambulatory dysfunction 05/30/2023   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   Atrial fibrillation (HCC) 09/29/2019   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/18/2019   Sepsis (HCC) 09/18/2019   Degenerative disc disease, cervical 01/23/2019   Skin irritation 10/28/2018   Low HDL (under 40) 05/24/2016   BPH (benign prostatic hyperplasia) 12/10/2013   Erectile dysfunction 08/26/2013   GERD (gastroesophageal reflux disease) 06/28/2013   Depression 04/01/2011   Anxiety state 12/15/2007   PEPTIC STRICTURE 12/15/2007   Irritable bowel syndrome 12/23/2006    Orientation RESPIRATION BLADDER Height & Weight     Self, Place  Normal Indwelling catheter Weight: 168 lb 6.9 oz (76.4 kg) Height:  5' 7.5 (171.5 cm)  BEHAVIORAL SYMPTOMS/MOOD NEUROLOGICAL BOWEL NUTRITION STATUS      Incontinent Diet (See  d/c summary)  AMBULATORY STATUS COMMUNICATION OF NEEDS Skin   Extensive Assist Verbally Other (Comment) (Redness to arm, face, hand, leg, chest, sacrum)                       Personal Care Assistance Level of Assistance  Bathing, Feeding, Dressing Bathing Assistance: Maximum assistance Feeding assistance: Limited assistance Dressing Assistance: Maximum assistance     Functional Limitations Info  Sight, Hearing, Speech Sight Info: Adequate Hearing Info: Adequate Speech Info: Adequate    SPECIAL CARE FACTORS FREQUENCY                       Contractures      Additional Factors Info  Code Status, Allergies, Psychotropic Code Status Info: DNR- Limited Allergies Info: Shellfish Allergy  Celexa  (Citalopram )  Levsin (Hyoscyamine)  Librax (Chlordiazepoxide-clidinium)  Ak-mycin (Erythromycin)  Mobic  (Meloxicam )  Penicillins Psychotropic Info: Zoloft         Current Medications (06/26/2024):  This is the current hospital active medication list Current Facility-Administered Medications  Medication Dose Route Frequency Provider Last Rate Last Admin   0.9 %  sodium chloride  infusion   Intravenous Continuous Emokpae, Ejiroghene E, MD 75 mL/hr at 06/26/24 0628 Infusion Verify at 06/26/24 0628   0.9 %  sodium chloride  infusion  250 mL Intravenous Continuous Adefeso, Oladapo, DO       acetaminophen  (TYLENOL ) tablet 650 mg  650 mg Oral Q6H PRN Emokpae, Ejiroghene E, MD   650 mg at 06/26/24 0219   Or  acetaminophen  (TYLENOL ) suppository 650 mg  650 mg Rectal Q6H PRN Emokpae, Ejiroghene E, MD       azithromycin (ZITHROMAX) 500 mg in sodium chloride  0.9 % 250 mL IVPB  500 mg Intravenous Q24H Melvenia Motto, MD   Stopped at 06/25/24 2050   baclofen  (LIORESAL ) tablet 5 mg  5 mg Oral TID Emokpae, Ejiroghene E, MD   5 mg at 06/26/24 0819   ceFEPIme  (MAXIPIME ) 2 g in sodium chloride  0.9 % 100 mL IVPB  2 g Intravenous Q12H Laron Agent, [facility]   Stopped at 06/26/24 0559   Chlorhexidine   Gluconate Cloth 2 % PADS 6 each  6 each Topical Daily Emokpae, Ejiroghene E, MD   6 each at 06/26/24 0820   diltiazem  (CARDIZEM ) 125 mg in dextrose  5% 125 mL (1 mg/mL) infusion  5-15 mg/hr Intravenous Titrated Adefeso, Oladapo, DO 5 mL/hr at 06/26/24 0628 5 mg/hr at 06/26/24 9371   guaiFENesin-dextromethorphan (ROBITUSSIN DM) 100-10 MG/5ML syrup 15 mL  15 mL Oral Q8H Emokpae, Ejiroghene E, MD   15 mL at 06/25/24 2344   metroNIDAZOLE  (FLAGYL ) IVPB 500 mg  500 mg Intravenous Q12H Emokpae, Ejiroghene E, MD 100 mL/hr at 06/26/24 9371 Infusion Verify at 06/26/24 9371   mupirocin ointment (BACTROBAN) 2 %   Nasal BID Emokpae, Ejiroghene E, MD   Given at 06/26/24 0820   norepinephrine (LEVOPHED) 4mg  in (0.016 mg/mL) premix infusion  0-10 mcg/min Intravenous Titrated Adefeso, Oladapo, DO 18.75 mL/hr at 06/26/24 0628 5 mcg/min at 06/26/24 9371   ondansetron  (ZOFRAN ) tablet 4 mg  4 mg Oral Q6H PRN Emokpae, Ejiroghene E, MD       Or   ondansetron  (ZOFRAN ) injection 4 mg  4 mg Intravenous Q6H PRN Emokpae, Ejiroghene E, MD       polyethylene glycol (MIRALAX  / GLYCOLAX ) packet 17 g  17 g Oral Daily PRN Emokpae, Ejiroghene E, MD       sertraline (ZOLOFT) tablet 25 mg  25 mg Oral Daily Emokpae, Ejiroghene E, MD       traZODone  (DESYREL ) tablet 50 mg  50 mg Oral QHS PRN Emokpae, Ejiroghene E, MD         Discharge Medications: Please see discharge summary for a list of discharge medications.  Relevant Imaging Results:  Relevant Lab Results:   Additional Information    [place] Saddie Kim, LCSW

## 2024-06-26 NOTE — Progress Notes (Signed)
 Phlebotomy at bedside. Pt is confused, swinging at monitor, and throwing pulse ox cord at phlebotomist.  He states  you're a bunch of phonies. Patient has removed his oxygen as well. He was re-oriented to situation and location however, he does not believe he is at the hospital. This is a change in patient previous behavior in which he was oriented and pleasant. Primary RN made aware. [place] RN

## 2024-06-26 NOTE — TOC Initial Note (Addendum)
 Transition of Care [facility]) - Initial/Assessment Note    Patient Details  Name: Mason [name] MRN: [id number] Date of Birth: [birth year]  Transition of Care [facility]) CM/SW Contact:    Mcarthur Saddie Kim, LCSW Phone Number: 06/26/2024, 8:40 AM  Clinical Narrative: Pt admitted due to severe sepsis. Pt is resident at [facility]. LCSW attempted to complete assessment with pt, but pt oriented to self only. LCSW left voicemail for pt's son requesting return call. Per Ozell at [facility], pt is long term and okay to return. FL2 completed. TOC will follow.                   LCSW received return call from son, Ritvik. He confirms plan to return to [facility] when medically stable.   Expected Discharge Plan: Skilled Nursing Facility Barriers to Discharge: Continued Medical Work up   Patient Goals and CMS Choice Patient states their goals for this hospitalization and ongoing recovery are:: anticipate return to SNF   Choice offered to / list presented to : Adult Children Sabin ownership interest in [facility]provided to::  (n/a)    Expected Discharge Plan and Services In-house Referral: Clinical Social Work   Post Acute Care Choice: Resumption of Svcs/PTA Provider Living arrangements for the past 2 months: Skilled Nursing Facility                                      Prior Living Arrangements/Services Living arrangements for the past 2 months: Skilled Nursing Facility Lives with:: Facility Resident Patient language and need for interpreter reviewed:: Yes        Need for Family Participation in Patient Care: Yes (Comment) Care giver support system in place?: Yes (comment)   Criminal Activity/Legal Involvement Pertinent to Current Situation/Hospitalization: No - Comment as needed  Activities of Daily Living   ADL Screening (condition at time of admission) Independently performs ADLs?: No Does the patient have a NEW difficulty with  bathing/dressing/toileting/self-feeding that is expected to last >3 days?: Yes (Initiates electronic notice to provider for possible OT consult) Does the patient have a NEW difficulty with getting in/out of bed, walking, or climbing stairs that is expected to last >3 days?: Yes (Initiates electronic notice to provider for possible PT consult) Does the patient have a NEW difficulty with communication that is expected to last >3 days?: Yes (Initiates electronic notice to provider for possible SLP consult) Is the patient deaf or have difficulty hearing?: No Does the patient have difficulty seeing, even when wearing glasses/contacts?: No Does the patient have difficulty concentrating, remembering, or making decisions?: Yes  Permission Sought/Granted                  Emotional Assessment   Attitude/Demeanor/Rapport: Unable to Assess Affect (typically observed): Unable to Assess Orientation: : Oriented to Self Alcohol / Substance Use: Not Applicable Psych Involvement: No (comment)  Admission diagnosis:  AKI (acute kidney injury) [N17.9] Severe sepsis (HCC) [A41.9, R65.20] Urinary tract infection with hematuria, site unspecified [N39.0, R31.9] Sepsis, due to unspecified organism, unspecified whether acute organ dysfunction present [facility]) [A41.9] Patient Active Problem List   Diagnosis Date Noted   Severe sepsis (HCC) 06/25/2024   UTI (urinary tract infection) 06/25/2024   PNA (pneumonia) 06/25/2024   AKI (acute kidney injury) 06/25/2024   Essential hypertension 05/12/2024   History of urinary retention 10/30/2023   History of elevated PSA 10/30/2023  Weakness of right side of body 06/05/2023   History of hemorrhagic stroke with residual hemiplegia (HCC) 05/30/2023   Glaucoma 05/30/2023   Former smoker 05/30/2023   Ambulatory dysfunction 05/30/2023   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   Atrial fibrillation (HCC) 09/29/2019   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/18/2019   Sepsis (HCC)  09/18/2019   Degenerative disc disease, cervical 01/23/2019   Skin irritation 10/28/2018   Low HDL (under 40) 05/24/2016   BPH (benign prostatic hyperplasia) 12/10/2013   Erectile dysfunction 08/26/2013   GERD (gastroesophageal reflux disease) 06/28/2013   Depression 04/01/2011   Anxiety state 12/15/2007   PEPTIC STRICTURE 12/15/2007   Irritable bowel syndrome 12/23/2006   PCP:  Cleatus Arlyss RAMAN, MD Pharmacy:   [facility] - [place], [place] - [address] [place] [address] Phone: [phone] Fax: [phone]     Social Drivers of Health (SDOH) Social History: SDOH Screenings   Food Insecurity: No Food Insecurity (06/25/2024)  Housing: Low Risk  (06/25/2024)  Transportation Needs: No Transportation Needs (06/25/2024)  Utilities: Not At Risk (06/25/2024)  Alcohol Screen: Low Risk  (07/08/2021)  Depression (PHQ2-9): Low Risk  (04/14/2023)  Financial Resource Strain: Low Risk  (07/20/2022)  Physical Activity: Insufficiently Active (07/20/2022)  Social Connections: Moderately Integrated (06/25/2024)  Stress: No Stress Concern Present (07/20/2022)  Tobacco Use: Medium Risk (06/25/2024)   SDOH Interventions:     Readmission Risk Interventions    05/15/2024    1:16 PM  Readmission Risk Prevention Plan  Transportation Screening Complete  Home Care Screening Complete  Medication Review (RN CM) Complete

## 2024-06-26 NOTE — Progress Notes (Signed)
 Progress Note   Patient: Mason [name] FMW:[id number] DOB: 04-26-1946 DOA: 06/25/2024     1 DOS: the patient was seen and examined on 06/26/2024   Brief hospital admission narrative: Per H&P written by Dr. Pearlean on 06/25/2024 Mason [name] is a 78 y.o. 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 Patient was brought to the ED reports of elevated white blood count of 42,000 and fever.  He also presented with headache, reports intermittent right eye pain, that self resolved, and has been going on for a long time, he saw an eye doctor and was told nothing was wrong with his eye.  He denies eye pain today.  He has a chronic indwelling Foley catheter, it was exchanged earlier today, and had blood.  Patient tells me prior to the exchange he did not have any blood in his urine.  Also reports a cough.   ED Course: Temperature max-99.5.  Heart rate 104-119.  Respiratory rate 13-26.  O2 sats 90 to 97% on room air.  Systolic blood pressure  92 to 132.  Lactic acidosis 3> 5> 2.9> 3.4. WBC 38.4. Creatinine elevated 1.56. UA with positive nitrites and leukocytes, many bacteria. Head CT-no acute abnormality  CT chest abdomen pelvis without contrast- Layering debris in the trachea with mucous plugging and lower lobe bronchial wall thickening, reflecting airway inflammation/infection. Extensive left colon diverticulosis with mild sigmoid diverticulitis. Blood and urine cultures obtained. IV fluid bolus given IV cefepime , and azithromycin started.  Assessment and plan 1-severe sepsis in the setting of UTI - Sepsis present at time of admission with tachycardia, leukocytosis, endorgan dysfunction with acute kidney injury and the presence of lactic acidosis. - Patient blood culture suggesting the presence of gram-positive cocci - Patient with positive MRSA PCR - Continue IV fluids and IV  antibiotics - Follow culture results and sensitivity - Afebrile at time of evaluation with overall improvement in his heart rate - Requiring peripheral pressor support - Continue to follow clinical response.  2-paroxysmal atrial fibrillation - Require transient use of Cardizem  drip - Currently on oral Cardizem  - Eliquis  has been resumed.  3-concern for possible pneumonia - No productive coughing spells and good saturation on room air -Current antibiotics will cover for any lung infections - Follow recommendations by speech therapy.  4-metabolic encephalopathy - In the setting of acute infection - Head CT negative for acute abnormality - Continue constant orientation and supportive care - On today's evaluation patient mentation back to his baseline.  5-acute kidney injury - In the setting of sepsis and UTI - Continue aggressive fluid resuscitation - Follow renal function trend. - Minimize the use of nephrotoxic agents, avoid contrast and hypotension.  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  7-history of urinary retention/BPH and chronic indwelling Foley catheter - Patient UTI associated with Foley catheter - Foley catheter in place (recently exchanged) - Continue Flomax  - Advised to maintain adequate oral hydration -Continue antibiotics as mentioned above for complaining of UTI  Subjective:  Following commands appropriately; no chest pain, no nausea vomiting.  Afebrile.  Levophed in place.  Off Cardizem  drip with good rate control on telemetry  Physical Exam: Vitals:   06/26/24 1326 06/26/24 1400 06/26/24 1500 06/26/24 1600  BP: 103/66 (!) 102/51 (!) 109/56 (!) 107/56  Pulse: 90 79 83 83  Resp: 20 18 20 18   Temp:  TempSrc:      SpO2: 93% 96% 96% 94%  Weight:      Height:       General exam: Alert, awake, oriented x 3; feeling better and following commands appropriately.   Afebrile. Respiratory system: Clear to auscultation. Respiratory effort normal.  Good saturation on room air. Cardiovascular system: RRR; no rubs, no gallops, no JVD. Gastrointestinal system: Abdomen is nondistended, soft and nontender. No organomegaly or masses felt. Normal bowel sounds heard. Central nervous system: Right-sided hemiparesis appreciated; no new focal neurological deficits. Extremities: No cyanosis or clubbing. Skin: No petechiae. Psychiatry: Judgement and insight appear normal.  Flat affect appreciated on exam.   Data Reviewed: Lactic acid: 2.3 CBC: WBCs 25.5, hemoglobin 14.21 platelet count 451K Basic metabolic panel: Sodium 133, potassium 4.2, chloride 101, bicarb 23, BUN 27, creatinine 1.12 and GFR >60  Family Communication: No family at bedside.  Disposition: Status is: Inpatient Remains inpatient appropriate because: Continue IV antibiotics.  CRITICAL CARE Performed by: Eric Nunnery   Total critical care time: 60 minutes  Critical care time was exclusive of separately billable procedures and treating other patients.  Critical care was necessary to treat or prevent imminent or life-threatening deterioration.  Critical care was time spent personally by me on the following activities: development of treatment plan with patient and/or surrogate as well as nursing, discussions with consultants, evaluation of patient's response to treatment, examination of patient, obtaining history from patient or surrogate, ordering and performing treatments and interventions, ordering and review of laboratory studies, ordering and review of radiographic studies, pulse oximetry and re-evaluation of patient's condition.   Author: Eric Nunnery, MD 06/26/2024 6:08 PM  For on call review [email or web address].

## 2024-06-26 NOTE — Progress Notes (Signed)
 Dr Manfred notified of patients lactic acid of 2.6 and also increased heartrate into 140's at times.

## 2024-06-26 NOTE — Plan of Care (Signed)
  Problem: Clinical Measurements: Goal: Diagnostic test results will improve Outcome: Progressing Goal: Respiratory complications will improve Outcome: Progressing Goal: Cardiovascular complication will be avoided Outcome: Progressing   

## 2024-06-26 NOTE — Progress Notes (Signed)
 Dr Manfred notified that bolus is complete and patient has had in and 450 out.  Heart rate continues in 140's.

## 2024-06-26 NOTE — Progress Notes (Signed)
 Date and time results received: 06/26/24 1204  Test: Blood cultures Critical Value: Positive for gram positive cocci  Name of Provider Notified: Dr. Ricky  Orders Received? Or Actions Taken?: No new orders at this time.

## 2024-06-26 NOTE — Progress Notes (Signed)
 Progress Note  RN called due to patient having HR in the 120s to 150s and in atrial fibrillation with RVR.  BP was soft.  Patient was admitted due to severe sepsis.  IV hydration was provided without improvement in HR.  Due to soft BP, patient was moved to stepdown unit. EKG personally reviewed showed A-fib with RVR In stepdown unit, patient was started on IV Levophed due to soft BP and Cardizem  drip was started.  Patient converted after about 1 hour. EKG personally reviewed showed normal sinus rhythm at a rate of 98 bpm Patient has a history of atrial fibrillation and on reviewing patient's home medication, it was noted that he had normal rate control or anticoagulants on med rec. Consider transitioning patient to oral rate control in the morning.  At bedside, patient was somnolent, but was arousable.  He was in no acute distress  Assessment and plan Paroxysmal atrial fibrillation Consider transitioning patient from IV Cardizem  to oral rate control in the morning. Wean patient off Levophed as tolerated  Critical time: 37 minutes   Critical care personally provided  managing the patient due to high probability of clinically significant and life threatening deterioration. This critical care time included obtaining a history; examining the patient, pulse oximetry; ordering and review of studies; arranging urgent treatment with development of a management plan; evaluation of patient's response of treatment; frequent reassessment; and discussions with other providers.  This critical care time was performed to assess and manage the high probability of imminent and life threatening deterioration that could result in multi-organ failure.

## 2024-06-26 NOTE — Evaluation (Signed)
 Clinical/Bedside Swallow Evaluation Patient Details  Name: Mason [name] MRN: [id number] Date of Birth: [birth year]  Today's Date: 06/26/2024 Time: SLP Start Time (ACUTE ONLY): 9063 SLP Stop Time (ACUTE ONLY): 0955 SLP Time Calculation (min) (ACUTE ONLY): 19 min  Past Medical History:  Past Medical History:  Diagnosis Date   Actinic keratosis 04/02/2013   L sup scapula - bx proven   Actinic keratosis 04/02/2013   R med ant deltoid - bx proven   Allergy    Anal fissure    Anxiety    Basal cell carcinoma    back - treated in the past    Basal cell carcinoma 05/02/2009   left supratip, Mohs   Cancer (HCC)    basal cell removed x4   Cataract    bil eyes   Depression    Diverticulosis of colon    GERD (gastroesophageal reflux disease)    esophageal narrowing   Glaucoma    Hiatal hernia    IBS (irritable bowel syndrome)    Insomnia    Internal hemorrhoids    Past Surgical History:  Past Surgical History:  Procedure Laterality Date   ANAL SPHINCTEROTOMY  03/1995   fistulotomy   APPENDECTOMY     BASAL CELL CARCINOMA EXCISION     on back   BICEPT TENODESIS Right 09/27/2004   decompression   COLONOSCOPY  04/1999   internal hemmroids,12-11-08 colon tics and hems only    ESOPHAGOGASTRODUODENOSCOPY  04/1999   with esophagitis and stricture   MOHS SURGERY  09/02/2008   L supratip of nose with flap,basal cell cancer [facility]/MOHS   MOUTH SURGERY  01/2020   TONGUE SURGERY  2017   had small place removed, noncancerous   TONSILLECTOMY AND ADENOIDECTOMY     remote   UPPER GASTROINTESTINAL ENDOSCOPY     WRIST SURGERY Right    WRIST SURGERY     scar tissue removed   HPI:  Mason [name] is a 78 y.o. 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  Patient was brought to the ED on 06/25/24 with reports of elevated white blood count of 42,000 and fever.  He also  presented with headache and intermittent right eye pain, that self resolved, and has been going on for a long time, he saw an eye doctor and was told nothing was wrong with his eye.  He denies eye pain today.  He has a chronic indwelling Foley catheter, it was exchanged earlier today, and had blood.  Patient tells me prior to the exchange he did not have any blood in his urine.  Also reports a cough. ST consulted for clinical swallow evaluation.    Assessment / Plan / Recommendation  Clinical Impression  Continue current diet of Heart healthy/thin liquids with general swallow precautions in place.  No further ST needs identified.       Pt participated in a clinical swallow evaluation with limited consistencies d/t pt refusal including thin via straw and solid consumption with encouragement and min verbal cues provided by SLP.  Pt appeared paranoid/confused during assessment stating They say this is a hospital, but I am not sure and nursing noted pt stating people are trying to listen to me on this phone.   Pt did allow oral care completion prior to evaluation with intact dentition and adequate oral motor control noted.  Pt exhibited adequate mastication efforts and a timely swallow response.  No overt s/s of  aspiration present throughout assessment.  Cognitive status may intermittently impact swallow function, but this was not observed during this assessment.  SLP Visit Diagnosis: Dysphagia, unspecified (R13.10)    Aspiration Risk  Mild aspiration risk    Diet Recommendation   Thin;Age appropriate regular  Medication Administration: Whole meds with liquid    Other  Recommendations Oral Care Recommendations: Oral care BID;Staff/trained caregiver to provide oral care     Assistance Recommended at Discharge  TBD  Functional Status Assessment Patient has had a recent decline in their functional status and demonstrates the ability to make significant improvements in function in a reasonable and  predictable amount of time.  Frequency and Duration  (evaluation only)          Prognosis Prognosis for improved oropharyngeal function: Good Barriers to Reach Goals: Cognitive deficits      Swallow Study   General Date of Onset: 06/25/24 HPI: Mason [name] is a 78 y.o. 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  Patient was brought to the ED on 06/25/24 with reports of elevated white blood count of 42,000 and fever.  He also presented with headache and intermittent right eye pain, that self resolved, and has been going on for a long time, he saw an eye doctor and was told nothing was wrong with his eye.  He denies eye pain today.  He has a chronic indwelling Foley catheter, it was exchanged earlier today, and had blood.  Patient tells me prior to the exchange he did not have any blood in his urine.  Also reports a cough. ST consulted for clinical swallow evaluation. Type of Study: Bedside Swallow Evaluation Previous Swallow Assessment: 05/03/23 MBS indicated need for Dysphagia 2/thin liquids Diet Prior to this Study: Regular;Thin liquids (Level 0) Temperature Spikes Noted: No Respiratory Status: Room air History of Recent Intubation: No Behavior/Cognition: Alert;Confused;Requires cueing Oral Cavity Assessment: Within Functional Limits Oral Care Completed by SLP: Yes Oral Cavity - Dentition: Adequate natural dentition Self-Feeding Abilities: Needs assist Patient Positioning: Upright in bed Baseline Vocal Quality: Normal Volitional Cough: Strong Volitional Swallow: Able to elicit    Oral/Motor/Sensory Function Overall Oral Motor/Sensory Function: Within functional limits   Ice Chips Ice chips: Not tested Other Comments: Pt refused   Thin Liquid Thin Liquid: Within functional limits Presentation: Straw    Nectar Thick Nectar Thick Liquid: Not tested   Honey Thick Honey  Thick Liquid: Not tested   Puree Puree: Not tested Other Comments: Pt declined   Solid     Solid: Within functional limits Presentation: Spoon      Pat Zariana Strub,M.S.,CCC-SLP 06/26/2024,10:25 AM

## 2024-06-27 ENCOUNTER — Other Ambulatory Visit (HOSPITAL_COMMUNITY): Payer: Self-pay | Admitting: *Deleted

## 2024-06-27 ENCOUNTER — Inpatient Hospital Stay (HOSPITAL_COMMUNITY)

## 2024-06-27 DIAGNOSIS — I480 Paroxysmal atrial fibrillation: Secondary | ICD-10-CM | POA: Diagnosis not present

## 2024-06-27 DIAGNOSIS — R7881 Bacteremia: Secondary | ICD-10-CM

## 2024-06-27 DIAGNOSIS — D75839 Thrombocytosis, unspecified: Secondary | ICD-10-CM | POA: Diagnosis not present

## 2024-06-27 DIAGNOSIS — A419 Sepsis, unspecified organism: Secondary | ICD-10-CM | POA: Diagnosis not present

## 2024-06-27 DIAGNOSIS — N179 Acute kidney failure, unspecified: Secondary | ICD-10-CM | POA: Diagnosis not present

## 2024-06-27 DIAGNOSIS — N401 Enlarged prostate with lower urinary tract symptoms: Secondary | ICD-10-CM | POA: Diagnosis not present

## 2024-06-27 DIAGNOSIS — R6521 Severe sepsis with septic shock: Secondary | ICD-10-CM

## 2024-06-27 DIAGNOSIS — B9562 Methicillin resistant Staphylococcus aureus infection as the cause of diseases classified elsewhere: Secondary | ICD-10-CM | POA: Diagnosis not present

## 2024-06-27 DIAGNOSIS — N390 Urinary tract infection, site not specified: Secondary | ICD-10-CM

## 2024-06-27 DIAGNOSIS — D72829 Elevated white blood cell count, unspecified: Secondary | ICD-10-CM | POA: Diagnosis not present

## 2024-06-27 DIAGNOSIS — G9340 Encephalopathy, unspecified: Secondary | ICD-10-CM

## 2024-06-27 DIAGNOSIS — B964 Proteus (mirabilis) (morganii) as the cause of diseases classified elsewhere: Secondary | ICD-10-CM

## 2024-06-27 DIAGNOSIS — R319 Hematuria, unspecified: Secondary | ICD-10-CM

## 2024-06-27 LAB — ECHOCARDIOGRAM COMPLETE
AR max vel: 2.06 cm2
AV Area VTI: 2.36 cm2
AV Area mean vel: 2.18 cm2
AV Mean grad: 3.9 mmHg
AV Peak grad: 8.8 mmHg
Ao pk vel: 1.49 m/s
Area-P 1/2: 3.53 cm2
Calc EF: 51 %
Height: 67.5 in
S' Lateral: 2.7 cm
Single Plane A2C EF: 48.1 %
Single Plane A4C EF: 52.6 %
Weight: 2694.9 [oz_av]

## 2024-06-27 LAB — CBC WITH DIFFERENTIAL/PLATELET
Abs Immature Granulocytes: 0.1 K/uL — ABNORMAL HIGH (ref 0.00–0.07)
Basophils Absolute: 0.1 K/uL (ref 0.0–0.1)
Basophils Relative: 0 %
Eosinophils Absolute: 1.2 K/uL — ABNORMAL HIGH (ref 0.0–0.5)
Eosinophils Relative: 7 %
HCT: 42.5 % (ref 39.0–52.0)
Hemoglobin: 13.7 g/dL (ref 13.0–17.0)
Immature Granulocytes: 1 %
Lymphocytes Relative: 3 %
Lymphs Abs: 0.6 K/uL — ABNORMAL LOW (ref 0.7–4.0)
MCH: 26.4 pg (ref 26.0–34.0)
MCHC: 32.2 g/dL (ref 30.0–36.0)
MCV: 81.9 fL (ref 80.0–100.0)
Monocytes Absolute: 0.5 K/uL (ref 0.1–1.0)
Monocytes Relative: 3 %
Neutro Abs: 15.4 K/uL — ABNORMAL HIGH (ref 1.7–7.7)
Neutrophils Relative %: 86 %
Platelets: 416 K/uL — ABNORMAL HIGH (ref 150–400)
RBC: 5.19 MIL/uL (ref 4.22–5.81)
RDW: 19.6 % — ABNORMAL HIGH (ref 11.5–15.5)
Smear Review: NORMAL
WBC: 17.8 K/uL — ABNORMAL HIGH (ref 4.0–10.5)
nRBC: 0 % (ref 0.0–0.2)

## 2024-06-27 LAB — BASIC METABOLIC PANEL WITH GFR
Anion gap: 6 (ref 5–15)
BUN: 21 mg/dL (ref 8–23)
CO2: 25 mmol/L (ref 22–32)
Calcium: 8.2 mg/dL — ABNORMAL LOW (ref 8.9–10.3)
Chloride: 102 mmol/L (ref 98–111)
Creatinine, Ser: 0.91 mg/dL (ref 0.61–1.24)
GFR, Estimated: >60 mL/min (ref >60)
Glucose, Bld: 130 mg/dL — ABNORMAL HIGH (ref 70–99)
Potassium: 4.1 mmol/L (ref 3.5–5.1)
Sodium: 133 mmol/L — ABNORMAL LOW (ref 135–145)

## 2024-06-27 LAB — URINE CULTURE

## 2024-06-27 MED ORDER — SODIUM CHLORIDE 0.9 % IV SOLN
250.0000 mL | INTRAVENOUS | Status: AC
  Administered 2024-06-27: 250 mL via INTRAVENOUS

## 2024-06-27 MED ORDER — MIDODRINE HCL 5 MG PO TABS
5.0000 mg | ORAL_TABLET | Freq: Three times a day (TID) | ORAL | Status: DC
  Administered 2024-06-27 – 2024-07-03 (×16): 5 mg via ORAL
  Filled 2024-06-27 (×18): qty 1

## 2024-06-27 MED ORDER — ORAL CARE MOUTH RINSE: 15.0000 mL | OROMUCOSAL | Status: DC | PRN

## 2024-06-27 MED ORDER — PERFLUTREN LIPID MICROSPHERE
1.0000 mL | INTRAVENOUS | Status: AC | PRN
  Administered 2024-06-27: 2 mL via INTRAVENOUS

## 2024-06-27 MED ORDER — VANCOMYCIN HCL 1750 MG/350ML IV SOLN
1750.0000 mg | Freq: Once | INTRAVENOUS | Status: AC
  Administered 2024-06-27: 1750 mg via INTRAVENOUS
  Filled 2024-06-27: qty 350

## 2024-06-27 MED ORDER — VANCOMYCIN HCL 1250 MG/250ML IV SOLN
1250.0000 mg | INTRAVENOUS | Status: DC
  Administered 2024-06-28: 1250 mg via INTRAVENOUS
  Filled 2024-06-27: qty 250

## 2024-06-27 NOTE — TOC Progression Note (Signed)
 Transition of Care [facility]) - Progression Note    Patient Details  Name: Mason [name] MRN: [id number] Date of Birth: 11-13-1945  Transition of Care [facility] CM/SW Contact  Hoy DELENA Bigness, LCSW Phone Number: 06/27/2024, 11:57 AM  Clinical Narrative:     Pt interested in having referrals sent out for possible LT placement at an alternate facility. Pt understanding if no facility accepts prior to discharge plan would be to return to [facility] for LTC. Referrals have been faxed out.    Expected Discharge Plan: Skilled Nursing Facility Barriers to Discharge: Continued Medical Work up               Expected Discharge Plan and Services In-house Referral: Clinical Social Work   Post Acute Care Choice: Resumption of Svcs/PTA Provider Living arrangements for the past 2 months: Skilled Nursing Facility                                       Social Drivers of Health (SDOH) Interventions SDOH Screenings   Food Insecurity: No Food Insecurity (06/25/2024)  Housing: Low Risk  (06/25/2024)  Transportation Needs: No Transportation Needs (06/25/2024)  Utilities: Not At Risk (06/25/2024)  Alcohol Screen: Low Risk  (07/08/2021)  Depression (PHQ2-9): Low Risk  (04/14/2023)  Financial Resource Strain: Low Risk  (07/20/2022)  Physical Activity: Insufficiently Active (07/20/2022)  Social Connections: Moderately Integrated (06/25/2024)  Stress: No Stress Concern Present (07/20/2022)  Tobacco Use: Medium Risk (06/25/2024)    Readmission Risk Interventions    05/15/2024    1:16 PM  Readmission Risk Prevention Plan  Transportation Screening Complete  Home Care Screening Complete  Medication Review (RN CM) Complete

## 2024-06-27 NOTE — Consult Note (Addendum)
 [facility]                                                                                        Patient Identification: Patient Name: Mason [name] MRN: [id number] Admit Date: 06/25/2024  1:35 PM Today's Date: 06/27/2024 Reason for consult: Staph aureus bacteremia Requesting provider: Sharyon auto consult  Principal Problem:   Severe sepsis [facility]) Active Problems:   BPH (benign prostatic hyperplasia)   Atrial fibrillation (HCC)   History of hemorrhagic stroke with residual hemiplegia (HCC)   Essential hypertension   UTI (urinary tract infection)   PNA (pneumonia)   AKI (acute kidney injury)   I connected with the patient by a video enabled telemedicine application and verified that I am speaking with the correct person using two identifiers.  Location: Patient: [facility] Provider: [facility]   Antibiotics: Azithromycin 11/4- Cefepime  11/4- Metronidazole  11/4-  Lines/Hardware:  Assessment # MRSA bacteremia - unclear cause ? UTI  - no note of wounds, cellulitis, hardware or joint or back pain   # Leukocytosis/Thrombocytosis - 2/2 above and expect to improve   # Encephalopathy  - seems to have resolved   # AKI-resolved  # Septic shock resolved   # BPH/urinary retention s/p indwelling Foley's - Foley is exchanged in the ED  # Proteus mirabilis bacteremia/UTI in 04/2024  Recommendations  - Continue vancomycin , pharmacy to dose.  Will DC cefepime  and azithromycin. Fu sensi's  - 2 sets of repeat blood cultures ordered - TEE to exclude endocarditis - Monitor CBC, BMP and vancomycin  trough - Monitor for metastatic sites of infection - Maintain contact precautions D/w Primary team Dr. Eben covering 11/7, Dr. Luiz covering over the weekend.  Please reach out if any questions or concerns  Rest of the management as per the primary team. Please call with questions or concerns.   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  He also had reported headache, intermittent right eye pain for 2 days but resolved during ED presentation.  Foley's catheter was exchanged earlier in the day of ED arrival  At ED tachycardic, lethargic, afebrile Foley's catheter present with hematuria in bag  Labs remarkable for WBC 38 .4, lactic acid 3, creatinine elevated to 1.56, NA 133, Hemoglobin 17.3, platelets 574  Influenza A/B/RSV/SARS-CoV-2 negative UA cloudy, large hemoglobin, moderate leukocytes, positive nitrite, 100 protein, many bacteria RBC 21-50 WBC more than 50  Was given IVF, IV cefepime , azithromycin  CTCAP 1. Layering debris in the trachea with mucous plugging and lower lobe bronchial wall thickening, reflecting airway inflammation/infection. 2. Extensive left colon diverticulosis with mild sigmoid diverticulitis.  ROS: aphasic but does not voice complaints   Past Medical History:  Diagnosis Date   Actinic keratosis 04/02/2013   L sup scapula - bx proven   Actinic keratosis 04/02/2013   R med ant deltoid -  bx proven   Allergy    Anal fissure    Anxiety    Basal cell carcinoma    back - treated in the past    Basal cell carcinoma 05/02/2009   left supratip, Mohs   Cancer (HCC)    basal cell removed x4   Cataract    bil eyes   Depression    Diverticulosis of colon    GERD (gastroesophageal reflux disease)    esophageal narrowing   Glaucoma    Hiatal hernia    IBS (irritable bowel syndrome)    Insomnia    Internal hemorrhoids     Past Surgical History:  Procedure Laterality Date   ANAL SPHINCTEROTOMY  03/1995   fistulotomy   APPENDECTOMY     BASAL CELL CARCINOMA EXCISION     on back   BICEPT TENODESIS Right 09/27/2004   decompression   COLONOSCOPY  04/1999   internal hemmroids,12-11-08 colon tics and hems only    ESOPHAGOGASTRODUODENOSCOPY  04/1999   with esophagitis and stricture   MOHS SURGERY  09/02/2008   L supratip of nose with flap,basal cell cancer [facility]/MOHS   MOUTH SURGERY  01/2020   TONGUE SURGERY  2017   had small place removed, noncancerous   TONSILLECTOMY AND ADENOIDECTOMY     remote   UPPER GASTROINTESTINAL ENDOSCOPY     WRIST SURGERY Right    WRIST SURGERY     scar tissue removed   Scheduled Meds:  apixaban   2.5 mg Oral BID   baclofen   5 mg Oral TID   Chlorhexidine  Gluconate Cloth  6 each Topical Daily   diltiazem   30 mg Oral Q8H   latanoprost   1 drop Both Eyes QHS   midodrine  5 mg Oral TID WC   mupirocin ointment   Nasal BID   sertraline  25 mg Oral Daily   tamsulosin   0.4 mg Oral QPM   timolol   1 drop Both Eyes Daily   Continuous Infusions:  sodium chloride      azithromycin Stopped (06/26/24 2039)   ceFEPime  (MAXIPIME ) IV Stopped (06/27/24 0507)   metronidazole  Stopped (06/27/24 0054)   norepinephrine (LEVOPHED) Adult infusion Stopped (06/26/24 1024)   PRN Meds:.acetaminophen  **OR** acetaminophen , guaiFENesin-dextromethorphan, ondansetron  **OR** ondansetron  (ZOFRAN ) IV, mouth rinse, polyethylene glycol, traZODone   Allergies  Allergen Reactions   Shellfish Allergy Shortness Of Breath, Rash and Other (See Comments)    GI upset Difficulty breathing  Shrimp and crab   Celexa  [Citalopram ] Other (See Comments)    Aggression    Levsin [Hyoscyamine] Other (See Comments)    Urinary retention   Librax [Chlordiazepoxide-Clidinium] Other (See Comments)    Urinary retention   Ak-Mycin [Erythromycin] Other (See Comments)    GI upset   Mobic  [Meloxicam ] Other (See Comments)     GI upset   Penicillins Itching, Rash and Dermatitis    Received Unasyn  earlier without acute reaction   Social History   Socioeconomic History   Marital status: Divorced    Spouse name: Not on file   Number of children: 1   Years of education: Not on file   Highest education level: Not on file  Occupational History   Occupation: Art Therapist, retired 1997    Employer: [employer]  Tobacco Use   Smoking status: Former    Current packs/day: 0.00    Types: Cigarettes, Cigars    Quit date: 08/22/1998    Years since quitting: 25.8   Smokeless tobacco: Never   Tobacco comments:    cigarettes  1968, cigars 2000  Vaping Use   Vaping status: Never Used  Substance and Sexual Activity   Alcohol use: Not Currently    Alcohol/week: 0.0 standard drinks of alcohol    Comment: rare   Drug use: No   Sexual activity: Never  Other Topics Concern   Not on file  Social History Narrative   Retired from [place]   From [place]   Prev divorced    1 son   Gerarda '63-'67, no known agent orange exposure      Pt stays at [facility]   Financial Resource Strain: Low Risk  (07/20/2022)   Overall Financial Resource Strain (CARDIA)    Difficulty of Paying Living Expenses: Not hard at all  Food Insecurity: No Food Insecurity (06/25/2024)   Hunger Vital Sign    Worried About Running Out of Food in the Last Year: Never true    Ran Out of Food in the Last Year: Never true  Transportation Needs: No Transportation Needs (06/25/2024)   PRAPARE - Administrator, Civil Service (Medical): No    Lack of Transportation (Non-Medical): No  Physical Activity: Insufficiently Active (07/20/2022)   Exercise Vital Sign    Days of Exercise per Week: 3 days    Minutes of Exercise per Session: 30 min  Stress: No Stress Concern Present (07/20/2022)   [employer] of Occupational Health - Occupational Stress Questionnaire    Feeling of Stress : Not at all  Social  Connections: Moderately Integrated (06/25/2024)   Social Connection and Isolation Panel    Frequency of Communication with Friends and Family: Three times a week    Frequency of Social Gatherings with Friends and Family: Three times a week    Attends Religious Services: 1 to 4 times per year    Active Member of Clubs or Organizations: No    Attends Banker Meetings: 1 to 4 times per year    Marital Status: Divorced  Catering Manager Violence: Not At Risk (06/25/2024)   Humiliation, Afraid, Rape, and Kick questionnaire    Fear of Current or Ex-Partner: No    Emotionally Abused: No    Physically Abused: No    Sexually Abused: No   Family History  Problem Relation Age of Onset   Hypertension Mother    Stroke Mother    Pneumonia Father    Bladder Cancer Father    Cancer Maternal Aunt        ? type   Breast cancer Maternal Aunt        mets   Esophageal cancer Maternal Aunt        aunt ? esophageal ca   Stomach cancer Maternal Aunt        aunt ? stmach ca   Colon cancer Maternal Aunt        had colostomy - not sure of origin   Cancer Maternal Grandmother        ? type   Prostate cancer Neg Hx    Rectal cancer Neg Hx    Vitals BP 121/68   Pulse 84   Temp 98.1 F (36.7 C)   Resp 15   Ht 5' 7.5 (1.715 m)   Wt 76.4 kg   SpO2 95%   BMI 25.99 kg/m    Physical Exam Lying in the bed, not in acute distress    Pertinent Microbiology Results for orders placed or performed during the hospital encounter of 06/25/24  Blood Culture (routine x 2)     Status: None (Preliminary result)   Collection Time: 06/25/24  2:25 PM   Specimen: BLOOD  Result Value Ref Range Status   Specimen Description BLOOD RIGHT ARM  Final   Special Requests   Final    BOTTLES DRAWN AEROBIC AND ANAEROBIC Blood Culture results may not be optimal due to an inadequate volume of blood received in culture bottles   Culture   Final    NO GROWTH 2 DAYS Performed at [facility], [address], [place], [place] [address]    Report Status PENDING  Incomplete  Blood Culture (routine x 2)     Status: Abnormal (Preliminary result)   Collection Time: 06/25/24  2:35 PM   Specimen: BLOOD  Result Value Ref Range Status   Specimen Description   Final    BLOOD LEFT ARM Performed at [facility], [address] [place], [place] [address]    Special Requests   Final    BOTTLES DRAWN AEROBIC AND ANAEROBIC Blood Culture results may not be optimal due to an inadequate volume of blood received in culture bottles Performed at [facility], [address], [place], [place] [address]    Culture  Setup Time   Final    BOTTLES DRAWN AEROBIC ONLY GRAM POSITIVE COCCI Gram Stain Report Called to,Read Back By and Verified With: C. ROGERS RN 06/26/24 @1203  BY J. WHITE CRITICAL RESULT CALLED TO, READ BACK BY AND VERIFIED WITH: RN CSABRA AHLE [phone] @ ELIYA.EDU FH    Culture (A)  Final    STAPHYLOCOCCUS AUREUS SUSCEPTIBILITIES TO FOLLOW Performed at [facility], [address] [place], [place] [address]    Report Status PENDING  Incomplete  Blood Culture ID Panel (Reflexed)     Status: Abnormal   Collection Time: 06/25/24  2:35 PM  Result Value Ref Range Status   Enterococcus faecalis NOT DETECTED NOT DETECTED Final   Enterococcus Faecium NOT DETECTED NOT DETECTED Final   Listeria monocytogenes NOT DETECTED NOT DETECTED Final   Staphylococcus species DETECTED (A) NOT DETECTED Final    Comment: CRITICAL RESULT CALLED TO, READ BACK BY AND VERIFIED WITH: RN CSABRA AHLE [phone] @ [phone] FH    Staphylococcus aureus (BCID) DETECTED (A) NOT DETECTED Final    Comment: Methicillin (oxacillin)-resistant Staphylococcus aureus (MRSA). MRSA is predictably resistant to beta-lactam antibiotics (except ceftaroline). Preferred therapy is vancomycin  unless clinically contraindicated. Patient requires contact precautions if  hospitalized. CRITICAL RESULT CALLED TO, READ BACK BY AND VERIFIED WITH: RN C. COOK [phone] @ 1822  FH    Staphylococcus epidermidis NOT DETECTED NOT DETECTED Final   Staphylococcus lugdunensis NOT DETECTED NOT DETECTED Final   Streptococcus species NOT DETECTED NOT DETECTED Final   Streptococcus agalactiae NOT DETECTED NOT DETECTED Final   Streptococcus pneumoniae NOT DETECTED NOT DETECTED Final   Streptococcus pyogenes NOT DETECTED NOT DETECTED Final   A.calcoaceticus-baumannii NOT DETECTED NOT DETECTED Final   Bacteroides fragilis NOT DETECTED NOT DETECTED Final   Enterobacterales NOT DETECTED NOT DETECTED Final   Enterobacter cloacae complex NOT DETECTED NOT DETECTED Final   Escherichia coli NOT DETECTED NOT DETECTED Final   Klebsiella aerogenes NOT DETECTED NOT DETECTED Final   Klebsiella oxytoca NOT DETECTED NOT DETECTED Final   Klebsiella pneumoniae NOT DETECTED NOT DETECTED Final   Proteus species NOT DETECTED NOT DETECTED Final   Salmonella species NOT DETECTED NOT DETECTED Final   Serratia marcescens NOT DETECTED NOT DETECTED Final   Haemophilus influenzae NOT  DETECTED NOT DETECTED Final   Neisseria meningitidis NOT DETECTED NOT DETECTED Final   Pseudomonas aeruginosa NOT DETECTED NOT DETECTED Final   Stenotrophomonas maltophilia NOT DETECTED NOT DETECTED Final   Candida albicans NOT DETECTED NOT DETECTED Final   Candida auris NOT DETECTED NOT DETECTED Final   Candida glabrata NOT DETECTED NOT DETECTED Final   Candida krusei NOT DETECTED NOT DETECTED Final   Candida parapsilosis NOT DETECTED NOT DETECTED Final   Candida tropicalis NOT DETECTED NOT DETECTED Final   Cryptococcus neoformans/gattii NOT DETECTED NOT DETECTED Final   Meth resistant mecA/C and MREJ DETECTED (A) NOT DETECTED Final    Comment: CRITICAL RESULT CALLED TO, READ BACK BY AND VERIFIED WITH: RN KYM AHLE [phone] @ [phone] FH Performed at [facility], [address] [place], [place] [address]   MRSA Next Gen by PCR, Nasal     Status: Abnormal   Collection Time: 06/26/24  2:41 AM   Specimen: Nasal  Mucosa; Nasal Swab  Result Value Ref Range Status   MRSA by PCR Next Gen DETECTED (A) NOT DETECTED Final    Comment: RESULT CALLED TO, READ BACK BY AND VERIFIED WITH: B. SUPER [phone], VIRAY,J (NOTE) The GeneXpert MRSA Assay (FDA approved for NASAL specimens only), is one component of a comprehensive MRSA colonization surveillance program. It is not intended to diagnose MRSA infection nor to guide or monitor treatment for MRSA infections. Test performance is not FDA approved in patients less than 23 years old. Performed at [facility], [address] [place], [place] [address]    Pertinent Lab seen by me:    Latest Ref Rng & Units 06/26/2024    3:27 AM 06/25/2024    4:05 PM 05/16/2024    4:27 AM  CBC  WBC 4.0 - 10.5 K/uL 25.5  38.4  13.3   Hemoglobin 13.0 - 17.0 g/dL 85.7  82.6  85.0   Hematocrit 39.0 - 52.0 % 43.3  54.8  45.9   Platelets 150 - 400 K/uL 451  574  411       Latest Ref Rng & Units 06/26/2024    3:27 AM 06/25/2024    4:05 PM 05/14/2024    4:49 AM  CMP  Glucose 70 - 99 mg/dL 888  878  91   BUN 8 - 23 mg/dL 27  31  15    Creatinine 0.61 - 1.24 mg/dL 8.87  8.43  9.22   Sodium 135 - 145 mmol/L 133  133  134   Potassium 3.5 - 5.1 mmol/L 4.2  5.1  3.9   Chloride 98 - 111 mmol/L 101  96  104   CO2 22 - 32 mmol/L 23  27  24    Calcium 8.9 - 10.3 mg/dL 7.9  8.9  7.7   Total Protein 6.5 - 8.1 g/dL  6.8  5.4   Total Bilirubin 0.0 - 1.2 mg/dL  0.7  0.6   Alkaline Phos 38 - 126 U/L  116  69   AST 15 - 41 U/L  25  53   ALT 0 - 44 U/L  40  50      Pertinent Imagings/Other Imagings Plain films and CT images have been personally visualized and interpreted; radiology reports have been reviewed. Decision making incorporated into the Impression / Recommendations.  CT CHEST ABDOMEN PELVIS WO CONTRAST Result Date: 06/25/2024 EXAM: CT CHEST, ABDOMEN AND PELVIS WITHOUT CONTRAST 06/25/2024 06:38:36 PM TECHNIQUE: CT of the chest, abdomen and pelvis was performed without the  administration of intravenous contrast. Multiplanar reformatted images are provided for review. Automated exposure control, iterative reconstruction, and/or weight based adjustment of the mA/kV was utilized to reduce the radiation dose to as low as reasonably achievable. COMPARISON: Chest radiograph 06/25/2024, CT chest 04/26/2023, CT abdomen and pelvis 10/09/2022. CLINICAL HISTORY: Sepsis. FINDINGS: CHEST: MEDIASTINUM AND LYMPH NODES: Heart and pericardium are unremarkable. The central airways are clear. No mediastinal, hilar or axillary lymphadenopathy. LUNGS AND PLEURA: Layering debris in the trachea. Mucous plugging in the lower lobes. Lower lobe bronchial wall thickening. Scarring in the lower lobes. No pleural effusion or pneumothorax. ABDOMEN AND PELVIS: LIVER: The liver is unremarkable. GALLBLADDER AND BILE DUCTS: Gallbladder is unremarkable. No biliary ductal dilatation. SPLEEN: No acute abnormality. PANCREAS: No acute abnormality. ADRENAL GLANDS: No acute abnormality. KIDNEYS, URETERS AND BLADDER: Cord catheter and gas in the decompressed bladder. No stones in the kidneys or ureters. No hydronephrosis. No perinephric or periureteral stranding. GI AND BOWEL: Stomach demonstrates no acute abnormality. Extensive sigmoid colon diverticulosis. Trace stranding about the proximal sigmoid colon (cervical series 5 image 517). There is no bowel obstruction. REPRODUCTIVE ORGANS: Enlarged prostate. PERITONEUM AND RETROPERITONEUM: No ascites. No free air. VASCULATURE: Aorta is normal in caliber. ABDOMINAL AND PELVIS LYMPH NODES: No lymphadenopathy. BONES AND SOFT TISSUES: No acute osseous abnormality. No focal soft tissue abnormality. IMPRESSION: 1. Layering debris in the trachea with mucous plugging and lower lobe bronchial wall thickening, reflecting airway inflammation/infection. 2. Extensive left colon diverticulosis with mild sigmoid diverticulitis. Electronically signed by: Norman Gatlin MD 06/25/2024 06:54 PM  EST RP Workstation: [id number]   CT Head Wo Contrast Result Date: 06/25/2024 CLINICAL DATA:  Headache x2 weeks. EXAM: CT HEAD WITHOUT CONTRAST TECHNIQUE: Contiguous axial images were obtained from the base of the skull through the vertex without intravenous contrast. RADIATION DOSE REDUCTION: This exam was performed according to the departmental dose-optimization program which includes automated exposure control, adjustment of the mA and/or kV according to patient size and/or use of iterative reconstruction technique. COMPARISON:  August 31, 2023 FINDINGS: Brain: There is generalized cerebral atrophy with widening of the extra-axial spaces and ventricular dilatation. There are areas of decreased attenuation within the white matter tracts of the supratentorial brain, consistent with microvascular disease changes. 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 Vascular: No hyperdense vessel or unexpected calcification. Skull: Normal. Negative for fracture or focal lesion. Sinuses/Orbits: There is mild to moderate severity bilateral ethmoid sinus mucosal thickening. Other: None. IMPRESSION: 1. 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 2. 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 3. No acute intracranial abnormality. Electronically Signed   By: Suzen Dials M.D.   On: 06/25/2024 18:53   DG Chest Port 1 View Result Date: 06/25/2024 EXAM: 1 VIEW(S) XRAY OF THE CHEST 06/25/2024 03:04:00 PM COMPARISON: Chest x-ray 01/10/2024. Chest CT 01/24/2024. CLINICAL HISTORY: Questionable sepsis - evaluate for abnormality. FINDINGS: LUNGS AND PLEURA: Small nodular density seen on previous x-ray likely overlies the first rib end on today's study. No pulmonary edema. No pleural effusion. No pneumothorax. HEART AND MEDIASTINUM: No acute abnormality of the  cardiac and mediastinal silhouettes. BONES AND SOFT TISSUES: No acute osseous abnormality. IMPRESSION: 1. No acute findings. 2. Small nodular density seen on previous x-ray likely overlies the first rib end on today's study. Follow-up chest CT should be considered. Electronically signed by: Greig Pique MD 06/25/2024 03:34 PM EST RP Workstation: [id number]    I discussed  the limitations of evaluation and management by telemedicine. The patient expressed understanding and agreed to proceed.  I discussed the assessment and treatment plan with the patient. The patient was provided an opportunity to ask questions and all were answered. The patient agreed with the plan and demonstrated an understanding of the instructions.   The patient was advised to call back or seek an in-person evaluation if the symptoms worsen or if the condition fails to improve as anticipated.  I spent 85 minutes involved in face-to-face and non-face-to-face activities for this patient on the day of the visit. Professional time spent includes the following activities: Preparing to see the patient (review of tests), Obtaining and reviewing separately obtained history (ED note, H&P, hospitalist progress note), Performing a medically appropriate evaluation , Ordering medications/labs, referring and communicating with other health care professionals, Documenting clinical information in the EMR, Independently interpreting results (not separately reported) and Care coordination (not separately reported).  Electronically signed by:   Plan d/w requesting provider as well as ID pharm D  Of note, portions of this note may have been created with voice recognition software. While this note has been edited for accuracy, occasional wrong-word or 'sound-a-like' substitutions may have occurred due to the inherent limitations of voice recognition software.   Annalee Orem, MD Infectious Disease Physician [facility] Pager: [phone]

## 2024-06-27 NOTE — Plan of Care (Signed)

## 2024-06-27 NOTE — Progress Notes (Signed)
 Progress Note   Patient: Mason [name] FMW:[id number] DOB: [birth year] DOA: 06/25/2024     2 DOS: the patient was seen and examined on 06/27/2024   Brief hospital admission narrative: Per H&P written by Dr. Pearlean on 06/25/2024 Mason [name] is a 78 y.o. 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 Patient was brought to the ED reports of elevated white blood count of 42,000 and fever.  He also presented with headache, reports intermittent right eye pain, that self resolved, and has been going on for a long time, he saw an eye doctor and was told nothing was wrong with his eye.  He denies eye pain today.  He has a chronic indwelling Foley catheter, it was exchanged earlier today, and had blood.  Patient tells me prior to the exchange he did not have any blood in his urine.  Also reports a cough.   ED Course: Temperature max-99.5.  Heart rate 104-119.  Respiratory rate 13-26.  O2 sats 90 to 97% on room air.  Systolic blood pressure  92 to 132.  Lactic acidosis 3> 5> 2.9> 3.4. WBC 38.4. Creatinine elevated 1.56. UA with positive nitrites and leukocytes, many bacteria. Head CT-no acute abnormality  CT chest abdomen pelvis without contrast- Layering debris in the trachea with mucous plugging and lower lobe bronchial wall thickening, reflecting airway inflammation/infection. Extensive left colon diverticulosis with mild sigmoid diverticulitis. Blood and urine cultures obtained. IV fluid bolus given IV cefepime , and azithromycin started.  Assessment and plan 1-severe sepsis with septic shock in the setting of UTI and MRSA bacteremia - Sepsis present at time of admission with tachycardia, leukocytosis, endorgan dysfunction with acute kidney injury and the presence of lactic acidosis. - Patient blood culture suggesting the presence of gram-positive cocci - Patient with positive MRSA  PCR and positive blood cultures for MRSA bacteremia. - Continue to follow clinical response - Off pressors - Case discussed with infectious disease service and patient has been started on vancomycin  - Cardiology service contacted for TEE; patient n.p.o. after midnight. - Transthoracic echo not demonstrating vegetation.  Grade 1 diastolic dysfunction, no wall motion abnormalities and preserved ejection fraction.  2-paroxysmal atrial fibrillation - Require transient use of Cardizem  drip - Currently on oral Cardizem  - Eliquis  has been resumed.  3-concern for possible pneumonia - No productive coughing spells and good saturation on room air -Patient reports no shortness of breath - Antibiotic has been now tailored to cover for MRSA bacteremia. - Follow recommendations by speech therapy.  4-metabolic encephalopathy - In the setting of acute infection - Head CT negative for acute abnormality - Continue constant orientation and supportive care -Resolved and is stable.  5-acute kidney injury - In the setting of sepsis and UTI - Continue aggressive fluid resuscitation - Follow renal function trend. - Minimize the use of nephrotoxic agents, avoid contrast and hypotension. - Resolved.  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  7-history of urinary retention/BPH and chronic indwelling Foley catheter - Patient UTI associated with Foley catheter - Foley catheter in place (recently exchanged) - Continue Flomax  - Advised to maintain adequate oral hydration -Continue antibiotics as mentioned above for complaining of UTI  Subjective:  Afebrile, no chest pain, no nausea, no vomiting.  Patient currently with sinus rhythm and and rate controlled while using oral Cardizem .  Off Levophed.  Physical Exam: Vitals:   06/27/24  1300 06/27/24 1400 06/27/24 1500 06/27/24 1600  BP: (!) 99/59 (!) 108/59 106/62 (!) 94/57   Pulse: 77 78 71 71  Resp: 20 17 18 15   Temp:      TempSrc:      SpO2: 97% 93% 95% 95%  Weight:      Height:       General exam: Alert, awake, oriented x 3; feeling better and currently afebrile. Respiratory system: Good saturation on room air. Cardiovascular system: Sinus rhythm, rate controlled, no rubs, no gallops, no JVD. Gastrointestinal system: Abdomen is nondistended, soft and nontender. No organomegaly or masses felt. Normal bowel sounds heard. Central nervous system: No new focal neurological deficits. Extremities: No cyanosis or clubbing. Skin: No petechiae. Psychiatry: Judgement and insight appear normal.  Flat affect appreciated on exam.   Latest data Reviewed: Lactic acid: 2.3 CBC: WBCs 17.8, hemoglobin 13.7 and platelet count 416K Basic metabolic panel: Sodium 133, potassium 4.1, chloride 102, bicarb 25, BUN 21, creatinine 0.91 and GFR >60 Blood cultures: Positive for MRSA  Family Communication: No family at bedside.  Disposition: Status is: Inpatient Remains inpatient appropriate because: Continue IV antibiotics.  Patient will be transfer to telemetry bed.  Time: 50 minutes  Author: Eric Nunnery, MD 06/27/2024 4:45 PM  For on call review [email or web address].

## 2024-06-27 NOTE — Progress Notes (Signed)
 Pharmacy Antibiotic Note  Mason [name] is a 78 y.o. male admitted on 06/25/2024 with pneumonia and bacteremia/UTI.  Pharmacy has been consulted for vancomycin  dosing.  Plan: Vancomycin  1750 mg IV x 1 dose Vancomycin  1250 mg IV every 24 hours. Monitor labs, c/s, and vanco levels as indicated.  Height: 5' 7.5 (171.5 cm) Weight: 76.4 kg (168 lb 6.9 oz) IBW/kg (Calculated) : 67.25  Temp (24hrs), Avg:98.5 F (36.9 C), Min:98 F (36.7 C), Max:99 F (37.2 C)  Recent Labs  Lab 06/25/24 1605 06/25/24 1849 06/25/24 2030 06/26/24 0025 06/26/24 0327 06/26/24 0648  WBC 38.4*  --   --   --  25.5*  --   CREATININE 1.56*  --   --   --  1.12  --   LATICACIDVEN 5.0* 2.9* 3.4* 2.6* 2.3* 1.8    Estimated Creatinine Clearance: 50.9 mL/min (by C-G formula based on SCr of 1.12 mg/dL).    Allergies  Allergen Reactions   Shellfish Allergy Shortness Of Breath, Rash and Other (See Comments)    GI upset Difficulty breathing  Shrimp and crab   Celexa  [Citalopram ] Other (See Comments)    Aggression    Levsin [Hyoscyamine] Other (See Comments)    Urinary retention   Librax [Chlordiazepoxide-Clidinium] Other (See Comments)    Urinary retention   Ak-Mycin [Erythromycin] Other (See Comments)    GI upset   Mobic  [Meloxicam ] Other (See Comments)    GI upset   Penicillins Itching, Rash and Dermatitis    Received Unasyn  earlier without acute reaction    Antimicrobials this admission: Vanco 11/6 >> Cefepime  11/4 >> Azith 11/4 >> Flagyl  11/4 >> 11/6  Microbiology results: 11/4 Bcx: staph aureus BCID: MRSA  MRSA PCR: +  Thank you for allowing pharmacy to be a part of this patient's care.  Elspeth Sour, PharmD Clinical Pharmacist 06/27/2024 8:46 AM

## 2024-06-28 ENCOUNTER — Other Ambulatory Visit (HOSPITAL_COMMUNITY): Payer: Self-pay | Admitting: *Deleted

## 2024-06-28 ENCOUNTER — Encounter (HOSPITAL_COMMUNITY): Payer: Self-pay | Admitting: Anesthesiology

## 2024-06-28 ENCOUNTER — Encounter (HOSPITAL_COMMUNITY)
Admission: EM | Disposition: A | Discharge status: Skilled Nursing Facility | Payer: Self-pay | Source: Skilled Nursing Facility | Attending: Internal Medicine

## 2024-06-28 ENCOUNTER — Inpatient Hospital Stay (HOSPITAL_COMMUNITY)

## 2024-06-28 DIAGNOSIS — N401 Enlarged prostate with lower urinary tract symptoms: Secondary | ICD-10-CM | POA: Diagnosis not present

## 2024-06-28 DIAGNOSIS — I480 Paroxysmal atrial fibrillation: Secondary | ICD-10-CM | POA: Diagnosis not present

## 2024-06-28 DIAGNOSIS — A419 Sepsis, unspecified organism: Secondary | ICD-10-CM | POA: Diagnosis not present

## 2024-06-28 DIAGNOSIS — N179 Acute kidney failure, unspecified: Secondary | ICD-10-CM | POA: Diagnosis not present

## 2024-06-28 LAB — CREATININE, SERUM
Creatinine, Ser: 0.83 mg/dL (ref 0.61–1.24)
GFR, Estimated: >60 mL/min (ref >60)

## 2024-06-28 SURGERY — ECHOCARDIOGRAM, TRANSESOPHAGEAL
Anesthesia: Monitor Anesthesia Care

## 2024-06-28 MED ORDER — HYDROXYZINE HCL 10 MG PO TABS
10.0000 mg | ORAL_TABLET | Freq: Once | ORAL | Status: AC
  Administered 2024-06-28: 10 mg via ORAL
  Filled 2024-06-28: qty 1

## 2024-06-28 MED ORDER — DILTIAZEM HCL ER COATED BEADS 120 MG PO CP24
120.0000 mg | ORAL_CAPSULE | Freq: Every day | ORAL | Status: DC
  Administered 2024-06-28 – 2024-07-05 (×7): 120 mg via ORAL
  Filled 2024-06-28 (×7): qty 1

## 2024-06-28 MED ORDER — VANCOMYCIN HCL 750 MG/150ML IV SOLN
750.0000 mg | Freq: Two times a day (BID) | INTRAVENOUS | Status: DC
  Administered 2024-06-29 – 2024-06-30 (×4): 750 mg via INTRAVENOUS
  Filled 2024-06-28 (×4): qty 150

## 2024-06-28 NOTE — TOC Progression Note (Signed)
 Transition of Care [facility]) - Progression Note    Patient Details  Name: Mason [name] MRN: [id number] Date of Birth: 08-10-1946  Transition of Care [facility]) CM/SW Contact  Hoy DELENA Bigness, LCSW Phone Number: 06/28/2024, 2:24 PM  Clinical Narrative:    CSW met with pt to discuss LTC bed offer at [facility] in [place]. Pt is agreeable to transferring to [facility]. Pt aware that he will need to work with PT in order to attempt to get auth for STR prior to transitioning to LTC.  CSW spoke with pt's son, who is agreeable to this plan as long as pt and his sister are agreeable.  CSW left VM for pt's daughter, Berle [phone]) to discuss.    Expected Discharge Plan: Skilled Nursing Facility Barriers to Discharge: Continued Medical Work up               Expected Discharge Plan and Services In-house Referral: Clinical Social Work   Post Acute Care Choice: Resumption of Svcs/PTA Provider Living arrangements for the past 2 months: Skilled Nursing Facility                                       Social Drivers of Health (SDOH) Interventions SDOH Screenings   Food Insecurity: No Food Insecurity (06/25/2024)  Housing: Low Risk  (06/25/2024)  Transportation Needs: No Transportation Needs (06/25/2024)  Utilities: Not At Risk (06/25/2024)  Alcohol Screen: Low Risk  (07/08/2021)  Depression (PHQ2-9): Low Risk  (04/14/2023)  Financial Resource Strain: Low Risk  (07/20/2022)  Physical Activity: Insufficiently Active (07/20/2022)  Social Connections: Moderately Integrated (06/25/2024)  Stress: No Stress Concern Present (07/20/2022)  Tobacco Use: Medium Risk (06/25/2024)    Readmission Risk Interventions    05/15/2024    1:16 PM  Readmission Risk Prevention Plan  Transportation Screening Complete  Home Care Screening Complete  Medication Review (RN CM) Complete

## 2024-06-28 NOTE — Progress Notes (Signed)
 Progress Note   Patient: Mason [name] FMW:[id number] DOB: May 28, 1946 DOA: 06/25/2024     3 DOS: the patient was seen and examined on 06/28/2024   Brief hospital admission narrative: Per H&P written by Dr. Pearlean on 06/25/2024 Mason [name] is a 78 y.o. 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 Patient was brought to the ED reports of elevated white blood count of 42,000 and fever.  He also presented with headache, reports intermittent right eye pain, that self resolved, and has been going on for a long time, he saw an eye doctor and was told nothing was wrong with his eye.  He denies eye pain today.  He has a chronic indwelling Foley catheter, it was exchanged earlier today, and had blood.  Patient tells me prior to the exchange he did not have any blood in his urine.  Also reports a cough.   ED Course: Temperature max-99.5.  Heart rate 104-119.  Respiratory rate 13-26.  O2 sats 90 to 97% on room air.  Systolic blood pressure  92 to 132.  Lactic acidosis 3> 5> 2.9> 3.4. WBC 38.4. Creatinine elevated 1.56. UA with positive nitrites and leukocytes, many bacteria. Head CT-no acute abnormality  CT chest abdomen pelvis without contrast- Layering debris in the trachea with mucous plugging and lower lobe bronchial wall thickening, reflecting airway inflammation/infection. Extensive left colon diverticulosis with mild sigmoid diverticulitis. Blood and urine cultures obtained. IV fluid bolus given IV cefepime , and azithromycin started.  Assessment and plan 1-severe sepsis with septic shock in the setting of UTI/MRSA bacteremia - Sepsis present at time of admission with tachycardia, leukocytosis, endorgan dysfunction with acute kidney injury and the presence of lactic acidosis. - Patient blood culture suggesting the presence of gram-positive cocci - Patient with positive MRSA  PCR - Continue IV vancomycin  - 2D echo not demonstrating large vegetations, grade 1 diastolic dysfunction and no wall motion abnormalities. -Following recommendations by ID service will pursue TEE - Afebrile at time of evaluation; in no acute distress. - No longer requiring pressor support; will continue midodrine and adjust therapy as required.  Septic shock resolved and overall sepsis features improving/resolving. - Continue to follow clinical response.  2-paroxysmal atrial fibrillation - Require transient use of Cardizem  drip - Rate controlled and currently sinus; will continue Cardizem  and orally; transitioning to extended release form once a day tablet. - Eliquis  has been resumed.  3-concern for possible pneumonia - No productive coughing spells and good saturation on room air -Current antibiotics will cover for any lung infections - Follow recommendations by speech therapy.  4-metabolic encephalopathy - In the setting of acute infection - Head CT negative for acute abnormality - Continue constant orientation and supportive care - On today's evaluation patient mentation back to his baseline.  5-acute kidney injury - In the setting of sepsis and UTI - Continue aggressive fluid resuscitation - Follow renal function trend. - Minimize the use of nephrotoxic agents, avoid contrast and hypotension.  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  7-history of urinary retention/BPH and chronic indwelling Foley catheter - Patient UTI associated with Foley catheter - Foley catheter in place (recently exchanged) - Continue Flomax  - Advised to maintain adequate oral hydration -Continue antibiotics as mentioned above for complaining of UTI  Subjective:  No fever, no chest pain, no nausea, no vomiting.  No requiring pressor support and demonstrating  sinus rhythm with controlled rate while using oral  Cardizem .  Physical Exam: Vitals:   06/28/24 0400 06/28/24 0500 06/28/24 0700 06/28/24 1118  BP: (!) 96/56 122/68    Pulse: 69 72    Resp: 14 15    Temp:  98.4 F (36.9 C) 98.4 F (36.9 C) 98.3 F (36.8 C)  TempSrc:  Oral Oral Axillary  SpO2: 98% 98%    Weight:      Height:       General exam: Alert, awake, oriented x 3; in no major distress and following commands.  Afebrile. Respiratory system: Good saturation on room air. Cardiovascular system: Rate controlled, sinus rhythm, no rubs, no gallops, no JVD. Gastrointestinal system: Abdomen is nondistended, soft and nontender. No organomegaly or masses felt. Normal bowel sounds heard. Central nervous system: No new focal neurological deficits. Extremities: No cyanosis or clubbing. Skin: No petechiae; Foley catheter in place. Psychiatry: Judgement and insight appear normal.  Flat affect appreciated on exam.   Latest Data Reviewed: Lactic acid: 2.3 CBC: WBCs 25.5, hemoglobin 14.21 platelet count 451K Basic metabolic panel: Sodium 133, potassium 4.2, chloride 101, bicarb 23, BUN 27, creatinine 1.12 and GFR >60  Family Communication: No family at bedside.  Disposition: Status is: Inpatient Remains inpatient appropriate because: Continue IV antibiotics.  Time:50 minutes.  Author: Eric Nunnery, MD 06/28/2024 4:02 PM  For on call review [email or web address].

## 2024-06-28 NOTE — Plan of Care (Signed)

## 2024-06-28 NOTE — Care Management Important Message (Signed)
 Important Message  Patient Details  Name: Mason [name] MRN: [id number] Date of Birth: Dec 25, 1945   Important Message Given:  Yes - Medicare IM     Leighton Luster L Emersen Mascari 06/28/2024, 1:35 PM

## 2024-06-28 NOTE — Progress Notes (Signed)
 Pharmacy Antibiotic Note  Mason [name] is a 78 y.o. male admitted on 06/25/2024 with pneumonia and bacteremia/UTI.  Pharmacy has been consulted for vancomycin  dosing.  AKI now resolved and Scr currently 0.83. Plan to adjust Vanc dose.  Plan: Change to Vancomycin  750 mg Q12H (eAUC 455)  Monitor labs, c/s, and vanco levels as indicated.  Height: 5' 7.5 (171.5 cm) Weight: 76.4 kg (168 lb 6.9 oz) IBW/kg (Calculated) : 67.25  Temp (24hrs), Avg:98.2 F (36.8 C), Min:97.7 F (36.5 C), Max:98.4 F (36.9 C)  Recent Labs  Lab 06/25/24 1605 06/25/24 1849 06/25/24 2030 06/26/24 0025 06/26/24 0327 06/26/24 0648 06/27/24 0903 06/28/24 0459  WBC 38.4*  --   --   --  25.5*  --  17.8*  --   CREATININE 1.56*  --   --   --  1.12  --  0.91 0.83  LATICACIDVEN 5.0* 2.9* 3.4* 2.6* 2.3* 1.8  --   --     Estimated Creatinine Clearance: 68.7 mL/min (by C-G formula based on SCr of 0.83 mg/dL).    Allergies  Allergen Reactions   Shellfish Allergy Shortness Of Breath, Rash and Other (See Comments)    GI upset Difficulty breathing  Shrimp and crab   Celexa  [Citalopram ] Other (See Comments)    Aggression    Levsin [Hyoscyamine] Other (See Comments)    Urinary retention   Librax [Chlordiazepoxide-Clidinium] Other (See Comments)    Urinary retention   Ak-Mycin [Erythromycin] Other (See Comments)    GI upset   Mobic  [Meloxicam ] Other (See Comments)    GI upset   Penicillins Itching, Rash and Dermatitis    Received Unasyn  earlier without acute reaction    Antimicrobials this admission: Vanco 11/6 >> Cefepime  11/4 >> 11/6 Azith 11/4 >> 11/5 Flagyl  11/4 >> 11/6  Microbiology results: 11/6 Bcx: No growth < 24 hours 11/4 Bcx: staph aureus BCID: MRSA  MRSA PCR: +  Thank you for allowing pharmacy to be a part of this patient's care.  Prentice DOROTHA Favors, PharmD PGY1 [facility] Pharm[facility] Administration and [facility]  06/28/2024 11:11 AM

## 2024-06-28 NOTE — Progress Notes (Addendum)
   [facility] has been requested to perform a transesophageal echocardiogram on Mason [name] for bacteremia.    The patient does NOT have any absolute or relative contraindications to a Transesophageal Echocardiogram (TEE).  The patient has no other conditions that may impact this procedure. Not currently on pressor support and O2 saturations are appropriate on RA. Recent labs from 06/27/2024 showed hemoglobin at 13.7 and platelets at 416 K.  K+ at 4.1. TTE showed preserved EF of 60 to 65% with grade 1 diastolic dysfunction, normal RV function and no significant abnormalities.  After careful review of history and examination, the risks and benefits of transesophageal echocardiogram have been explained including risks of esophageal damage, perforation (1:10,000 risk), bleeding, pharyngeal hematoma as well as other potential complications associated with conscious sedation including aspiration, arrhythmia, respiratory failure and death. Alternatives to treatment were discussed, questions were answered. Patient is willing to proceed. Patient A&Ox4. Has a DNR limited order but willing to rescind during the time of his procedure.   TEE scheduled for today at 1300 with Dr. Alvan.   Signed, Mason CHRISTELLA Qua, PA-C  06/28/2024 7:29 AM    Addendum: 06/28/2024 at 1030: Notified by the patient's nurse that he was refusing treatment. Tired of medications and tests. Went to talk with the patient again and he told me he does not want any tests or medications and told me to get out of his room. Had some delirium earlier in admission but A&Ox4 today. Will cancel TEE. Would recommend GOC discussion in general if refusing treatment. Please notify Cardiology on Monday if he changes his mind over the weekend and wants to proceed.   Signed, Mason CHRISTELLA Qua, PA-C 06/28/2024, 10:37 AM Pager: [phone]

## 2024-06-29 DIAGNOSIS — A419 Sepsis, unspecified organism: Secondary | ICD-10-CM | POA: Diagnosis not present

## 2024-06-29 DIAGNOSIS — N401 Enlarged prostate with lower urinary tract symptoms: Secondary | ICD-10-CM | POA: Diagnosis not present

## 2024-06-29 DIAGNOSIS — I480 Paroxysmal atrial fibrillation: Secondary | ICD-10-CM | POA: Diagnosis not present

## 2024-06-29 DIAGNOSIS — N179 Acute kidney failure, unspecified: Secondary | ICD-10-CM | POA: Diagnosis not present

## 2024-06-29 NOTE — Evaluation (Signed)
 Physical Therapy Evaluation Patient Details Name: Mason [name] MRN: [id number] DOB: [birth year] Today's Date: 06/29/2024  History of Present Illness  Mason [name] is a 78 y.o. 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  Patient was brought to the ED reports of elevated white blood count of 42,000 and fever.  He also presented with headache, reports intermittent right eye pain, that self resolved, and has been going on for a long time, he saw an eye doctor and was told nothing was wrong with his eye.  He denies eye pain today.  He has a chronic indwelling Foley catheter, it was exchanged earlier today, and had blood.  Patient tells me prior to the exchange he did not have any blood in his urine.  Also reports a cough.   Clinical Impression  Patient demonstrates slow labored movement for sitting up at bedside, once seated had frequent posterior leaning due to weakness/stiffness, after having patient lean forward to stretch low back then patient was able to maintain sitting balance. Patient very unsteady on feet and limited to standing on LLE due to right sided paralysis and required Max assist stand pivot to transfer to chair. Patient tolerated sitting up in chair after therapy. Patient will benefit from continued skilled physical therapy in hospital and recommended venue below to increase strength, balance, endurance for safe ADLs and gait.           If plan is discharge home, recommend the following: A lot of help with bathing/dressing/bathroom;A lot of help with walking and/or transfers;Help with stairs or ramp for entrance;Assist for transportation;Assistance with cooking/housework   Can travel by private vehicle   No    Equipment Recommendations None recommended by PT  Recommendations for Other Services       Functional Status Assessment Patient has had a recent  decline in their functional status and demonstrates the ability to make significant improvements in function in a reasonable and predictable amount of time.     Precautions / Restrictions Precautions Precautions: Fall Recall of Precautions/Restrictions: Intact Restrictions Weight Bearing Restrictions Per Provider Order: No      Mobility  Bed Mobility Overal bed mobility: Needs Assistance Bed Mobility: Supine to Sit     Supine to sit: Max assist     General bed mobility comments: increased time, labored movement    Transfers Overall transfer level: Needs assistance Equipment used: 1 person hand held assist Transfers: Sit to/from Stand, Bed to chair/wheelchair/BSC Sit to Stand: Max assist Stand pivot transfers: Max assist         General transfer comment: poor tolerance for standing due to weakness, fatigues easily    Ambulation/Gait                  Stairs            Wheelchair Mobility     Tilt Bed    Modified Rankin (Stroke Patients Only)       Balance Overall balance assessment: Needs assistance Sitting-balance support: Feet supported, Single extremity supported Sitting balance-Leahy Scale: Poor Sitting balance - Comments: fair/poor seated at EOB Postural control: Posterior lean Standing balance support: During functional activity, No upper extremity supported Standing balance-Leahy Scale: Poor Standing balance comment: hand held assist                             Pertinent Vitals/Pain Pain Assessment Pain  Assessment: Faces Faces Pain Scale: Hurts even more Pain Location: RUE with movement Pain Descriptors / Indicators: Sore, Grimacing, Discomfort, Guarding Pain Intervention(s): Limited activity within patient's tolerance, Monitored during session, Repositioned    Home Living Family/patient expects to be discharged to:: Skilled nursing facility                        Prior Function Prior Level of Function :  Needs assist       Physical Assist : Mobility (physical)     Mobility Comments: Assisted transfers, uses wheelchair for mobility ADLs Comments: Assisted by SNF staff.     Extremity/Trunk Assessment   Upper Extremity Assessment Upper Extremity Assessment: Generalized weakness;RUE deficits/detail;LUE deficits/detail RUE Deficits / Details: grossly -2/5 RUE: Unable to fully assess due to pain RUE Sensation: decreased proprioception RUE Coordination: decreased fine motor;decreased gross motor LUE Deficits / Details: grossly 4/5 LUE Sensation: WNL LUE Coordination: WNL    Lower Extremity Assessment Lower Extremity Assessment: Generalized weakness;RLE deficits/detail;LLE deficits/detail RLE Deficits / Details: grossly -2/5 RLE Sensation: decreased proprioception RLE Coordination: decreased fine motor;decreased gross motor LLE Deficits / Details: grossly -3/5 LLE Sensation: WNL LLE Coordination: WNL    Cervical / Trunk Assessment Cervical / Trunk Assessment: Kyphotic  Communication   Communication Communication: No apparent difficulties    Cognition Arousal: Alert Behavior During Therapy: WFL for tasks assessed/performed   PT - Cognitive impairments: No apparent impairments                         Following commands: Intact       Cueing Cueing Techniques: Verbal cues, Tactile cues     General Comments      Exercises     Assessment/Plan    PT Assessment Patient needs continued PT services  PT Problem List Decreased strength;Decreased activity tolerance;Decreased balance;Decreased mobility;Impaired sensation;Pain;Decreased coordination       PT Treatment Interventions DME instruction;Functional mobility training;Therapeutic activities;Therapeutic exercise;Balance training;Wheelchair mobility training;Patient/family education    PT Goals (Current goals can be found in the Care Plan section)  Acute Rehab PT Goals Patient Stated Goal: return  home PT Goal Formulation: With patient Time For Goal Achievement: 07/13/24 Potential to Achieve Goals: Good    Frequency Min 3X/week     Co-evaluation               AM-PAC PT 6 Clicks Mobility  Outcome Measure Help needed turning from your back to your side while in a flat bed without using bedrails?: A Lot Help needed moving from lying on your back to sitting on the side of a flat bed without using bedrails?: A Lot Help needed moving to and from a bed to a chair (including a wheelchair)?: A Lot Help needed standing up from a chair using your arms (e.g., wheelchair or bedside chair)?: A Lot Help needed to walk in hospital room?: Total Help needed climbing 3-5 steps with a railing? : Total 6 Click Score: 10    End of Session   Activity Tolerance: Patient tolerated treatment well;Patient limited by fatigue Patient left: in chair;with call bell/phone within reach Nurse Communication: Mobility status PT Visit Diagnosis: Unsteadiness on feet (R26.81);Other abnormalities of gait and mobility (R26.89);Muscle weakness (generalized) (M62.81)    Time: 9079-9055 PT Time Calculation (min) (ACUTE ONLY): 24 min   Charges:   PT Evaluation $PT Eval Moderate Complexity: 1 Mod PT Treatments $Therapeutic Activity: 23-37 mins PT General Charges $$ ACUTE PT VISIT:  1 Visit         1:44 PM, 06/29/24 Mason [name], MPT Physical Therapist with [facility] [phone] office [phone] mobile phone

## 2024-06-29 NOTE — Plan of Care (Signed)
  Problem: Acute Rehab PT Goals(only PT should resolve) Goal: Pt will Roll Supine to Side Outcome: Progressing Flowsheets (Taken 06/29/2024 1345) Pt will Roll Supine to Side: with mod assist Goal: Pt Will Go Supine/Side To Sit Outcome: Progressing Flowsheets (Taken 06/29/2024 1345) Pt will go Supine/Side to Sit: with moderate assist Goal: Pt Will Go Sit To Supine/Side Outcome: Progressing Flowsheets (Taken 06/29/2024 1345) Pt will go Sit to Supine/Side: with moderate assist Goal: Patient Will Perform Sitting Balance Outcome: Progressing Flowsheets (Taken 06/29/2024 1345) Patient will perform sitting balance: with moderate assist Goal: Patient Will Transfer Sit To/From Stand Outcome: Progressing Flowsheets (Taken 06/29/2024 1345) Patient will transfer sit to/from stand: with modified independence Goal: Pt Will Transfer Bed To Chair/Chair To Bed Outcome: Progressing Flowsheets (Taken 06/29/2024 1345) Pt will Transfer Bed to Chair/Chair to Bed: with mod assist   1:46 PM, 06/29/24 Lynwood Music, MPT Physical Therapist with [facility] [phone] office [phone] mobile phone

## 2024-06-29 NOTE — Progress Notes (Signed)
 Progress Note   Patient: Mason [name] FMW:[id number] DOB: April 07, 1946 DOA: 06/25/2024     4 DOS: the patient was seen and examined on 06/29/2024   Brief hospital admission narrative: Per H&P written by Dr. Pearlean on 06/25/2024 Mason [name] is a 78 y.o. 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 Patient was brought to the ED reports of elevated white blood count of 42,000 and fever.  He also presented with headache, reports intermittent right eye pain, that self resolved, and has been going on for a long time, he saw an eye doctor and was told nothing was wrong with his eye.  He denies eye pain today.  He has a chronic indwelling Foley catheter, it was exchanged earlier today, and had blood.  Patient tells me prior to the exchange he did not have any blood in his urine.  Also reports a cough.   ED Course: Temperature max-99.5.  Heart rate 104-119.  Respiratory rate 13-26.  O2 sats 90 to 97% on room air.  Systolic blood pressure  92 to 132.  Lactic acidosis 3> 5> 2.9> 3.4. WBC 38.4. Creatinine elevated 1.56. UA with positive nitrites and leukocytes, many bacteria. Head CT-no acute abnormality  CT chest abdomen pelvis without contrast- Layering debris in the trachea with mucous plugging and lower lobe bronchial wall thickening, reflecting airway inflammation/infection. Extensive left colon diverticulosis with mild sigmoid diverticulitis. Blood and urine cultures obtained. IV fluid bolus given IV cefepime , and azithromycin started.  Assessment and plan 1-severe sepsis with septic shock in the setting of UTI/MRSA bacteremia - Sepsis present at time of admission with tachycardia, leukocytosis, endorgan dysfunction with acute kidney injury and the presence of lactic acidosis. - Patient blood culture suggesting the presence of gram-positive cocci - Patient with positive MRSA  PCR - Continue IV vancomycin  - 2D echo not demonstrating large vegetations, grade 1 diastolic dysfunction and no wall motion abnormalities. -Following recommendations by ID service will pursuit evaluation with TEE (Planned for Monday) - Afebrile and in no acute distress. - No longer requiring support; overall sepsis.  Resolving. - Continue supportive care.  2-paroxysmal atrial fibrillation - Require transient use of Cardizem  drip - Rate controlled and currently sinus; will continue Cardizem  and orally; transitioning to extended release form once a day tablet. - Continue Eliquis .  3-concern for possible pneumonia - No productive coughing spells and good saturation on room air -Current antibiotics will cover for any lung infections - Follow recommendations by speech therapy.  4-metabolic encephalopathy - In the setting of acute infection - Head CT negative for acute abnormality - Continue constant orientation and supportive care - On today's evaluation patient mentation back to his baseline.  5-acute kidney injury - In the setting of sepsis and UTI - Continue aggressive fluid resuscitation - Follow renal function trend. - Continue to minimize the use of nephrotoxic agents, avoid contrast and hypotension.  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  7-history of urinary retention/BPH and chronic indwelling Foley catheter - Patient UTI associated with Foley catheter - Foley catheter in place (recently exchanged) - Continue Flomax  - Advised to maintain adequate oral hydration -Continue antibiotics as mentioned above; which will also cover for any urinary infection.  Subjective:  No fever, no chest pain, no nausea, no vomiting.  Overall feeling better, in good spirit and no longer declining any treatments,  medications or intervention.  Physical Exam: Vitals:   06/29/24  0500 06/29/24 0606 06/29/24 0900 06/29/24 1350  BP: (!) 106/57 109/63 117/70 105/71  Pulse: 61 66 71 62  Resp: 13 12  16   Temp:  98.2 F (36.8 C)  97.9 F (36.6 C)  TempSrc:  Axillary  Oral  SpO2: 97% 96%  94%  Weight:      Height:       General exam: Alert, awake, oriented x 3; overall feeling better and afebrile.  Patient no longer refusing treatments and very pleasant on exam. Respiratory system: Good saturation on room air; no using accessory muscles. Cardiovascular system: Rate controlled, no rubs, no gallops, no JVD on exam.  Patient denies palpitation. Gastrointestinal system: Abdomen is nondistended, soft and nontender. No organomegaly or masses felt. Normal bowel sounds heard. Central nervous system: No new focal deficits; right-sided hemiparesis appreciated on exam. Extremities: No cyanosis or clubbing. Skin: No petechiae.  Chronic indwelling catheter in place. Psychiatry: Judgement and insight appear normal.  Flat affect appreciated on exam.    Latest Data Reviewed: Lactic acid: 2.3 CBC: WBCs 25.5, hemoglobin 14.21 platelet count 451K Basic metabolic panel: Sodium 133, potassium 4.2, chloride 101, bicarb 23, BUN 27, creatinine 1.12 and GFR >60  Family Communication: Family and significant other is at bedside. Disposition: Status is: Inpatient Remains inpatient appropriate because: Continue IV antibiotics.  Time:50 minutes.  Author: Eric Nunnery, MD 06/29/2024 6:06 PM  For on call review [email or web address].

## 2024-06-29 NOTE — Plan of Care (Signed)

## 2024-06-29 NOTE — TOC Progression Note (Signed)
 Transition of Care [facility]) - Progression Note    Patient Details  Name: Mason [name] MRN: [id number] Date of Birth: 28-Mar-1946  [facility] CM/SW Contact  Noreen KATHEE [place], [place] Phone Number: 06/29/2024, 12:51 PM  Clinical Narrative:     CSW reached out to Daughter Janette who confirmed being aware and in agreement with SNF change. Daughter shared that she checked the facility out and her and her brother are fine with the facility. Daughter stated that patient expressed giving the facility a chance. CSW will continue to follow fro auth after PT note is complete. CSW will continue to follow.   Expected Discharge Plan: Skilled Nursing Facility Barriers to Discharge: Continued Medical Work up               Expected Discharge Plan and Services In-house Referral: Clinical Social Work   Post Acute Care Choice: Resumption of Svcs/PTA Provider Living arrangements for the past 2 months: Skilled Nursing Facility                                       Social Drivers of Health (SDOH) Interventions SDOH Screenings   Food Insecurity: No Food Insecurity (06/25/2024)  Housing: Low Risk  (06/25/2024)  Transportation Needs: No Transportation Needs (06/25/2024)  Utilities: Not At Risk (06/25/2024)  Alcohol Screen: Low Risk  (07/08/2021)  Depression (PHQ2-9): Low Risk  (04/14/2023)  Financial Resource Strain: Low Risk  (07/20/2022)  Physical Activity: Insufficiently Active (07/20/2022)  Social Connections: Moderately Integrated (06/25/2024)  Stress: No Stress Concern Present (07/20/2022)  Tobacco Use: Medium Risk (06/25/2024)    Readmission Risk Interventions    06/29/2024   12:51 PM 06/29/2024   12:45 PM 05/15/2024    1:16 PM  Readmission Risk Prevention Plan  Transportation Screening Complete Complete Complete  Home Care Screening Complete Complete Complete  Medication Review (RN CM) Complete Complete Complete

## 2024-06-30 DIAGNOSIS — A419 Sepsis, unspecified organism: Secondary | ICD-10-CM | POA: Diagnosis not present

## 2024-06-30 DIAGNOSIS — R6520 Severe sepsis without septic shock: Secondary | ICD-10-CM | POA: Diagnosis not present

## 2024-06-30 LAB — CULTURE, BLOOD (ROUTINE X 2): Culture: NO GROWTH

## 2024-06-30 LAB — CBC
HCT: 45.7 % (ref 39.0–52.0)
Hemoglobin: 14.5 g/dL (ref 13.0–17.0)
MCH: 26 pg (ref 26.0–34.0)
MCHC: 31.7 g/dL (ref 30.0–36.0)
MCV: 82 fL (ref 80.0–100.0)
Platelets: 457 K/uL — ABNORMAL HIGH (ref 150–400)
RBC: 5.57 MIL/uL (ref 4.22–5.81)
RDW: 20 % — ABNORMAL HIGH (ref 11.5–15.5)
WBC: 14.9 K/uL — ABNORMAL HIGH (ref 4.0–10.5)
nRBC: 0 % (ref 0.0–0.2)

## 2024-06-30 LAB — BASIC METABOLIC PANEL WITH GFR
Anion gap: 7 (ref 5–15)
BUN: 13 mg/dL (ref 8–23)
CO2: 27 mmol/L (ref 22–32)
Calcium: 8.1 mg/dL — ABNORMAL LOW (ref 8.9–10.3)
Chloride: 105 mmol/L (ref 98–111)
Creatinine, Ser: 0.83 mg/dL (ref 0.61–1.24)
GFR, Estimated: >60 mL/min (ref >60)
Glucose, Bld: 87 mg/dL (ref 70–99)
Potassium: 3.7 mmol/L (ref 3.5–5.1)
Sodium: 139 mmol/L (ref 135–145)

## 2024-06-30 LAB — GLUCOSE, CAPILLARY: Glucose-Capillary: 110 mg/dL — ABNORMAL HIGH (ref 70–99)

## 2024-06-30 LAB — VANCOMYCIN, PEAK: Vancomycin Pk: 23 ug/mL — ABNORMAL LOW (ref 30–40)

## 2024-06-30 NOTE — Plan of Care (Signed)
   Problem: Education: Goal: Knowledge of General Education information will improve Description: Including pain rating scale, medication(s)/side effects and non-pharmacologic comfort measures Outcome: Progressing   Problem: Activity: Goal: Risk for activity intolerance will decrease Outcome: Progressing   Problem: Coping: Goal: Level of anxiety will decrease Outcome: Progressing

## 2024-06-30 NOTE — Plan of Care (Signed)

## 2024-06-30 NOTE — Progress Notes (Signed)
 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  Foley catheter was exchanged recently and was bloody.  In the emergency room temperature 99.5, heart rate 104-119.  97% on room air.  Blood pressure stable.  Lactic acid 3-5-3.4.  WBC count 38.4.  Creatinine 1.56.  UA was abnormal.  hCG was normal.  CT chest abdomen pelvis without contrast showed layering debris in the trachea with mucous plugging and lower lobe bronchial wall thickening.  Blood cultures and urine culture was collected and patient admitted to the hospital. Patient was found to have MRSA bacteremia.  Subjective: Patient seen and examined.  Denies any complaints.  No overnight events.  Patient tells me that he was never able to get a voiding trial to remove his catheter as it was placed for the stroke.  He is agreeable to get TEE for tomorrow.  Assessment & Plan:   Severe sepsis with septic shock in the setting of MRSA bacteremia, UTI due to indwelling Foley catheter.  Catheter exchanged on the day of admission at nursing home. Presented with tachycardia, leukocytosis, lactic acidosis. 11/4, blood cultures MRSA 11/6, blood cultures negative MRSA swab positive Urine culture with multiple organisms. TTE without any evidence of endocarditis. For TEE tomorrow. Remains on vancomycin  and followed by an infectious disease.  Paroxysmal A-fib: He required transiently be on Cardizem  drip.  Currently sinus rhythm.  Therapeutic on Eliquis .  On Cardizem  CD 120 mg daily.  Concern for possible pneumonia: Improved.  Seen by speech therapy.  On regular diet.  On vancomycin .  Acute metabolic  encephalopathy, infective encephalopathy secondary to above.  Improving.  Acute kidney injury: Improving.  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  No new neurological deficit.  Continue work with PT OT.    DVT prophylaxis: apixaban  (ELIQUIS ) tablet 2.5 mg Start: 06/26/24 1015 SCDs Start: 06/25/24 2233 apixaban  (ELIQUIS ) tablet 2.5 mg   Code Status: DNR/DNI Family Communication: None at the bedside Disposition Plan: Status is: Inpatient Remains inpatient appropriate because: Bacteremia, IV antibiotics, inpatient procedures planned     Consultants:  Infectious disease  Procedures:  None  Antimicrobials:  Vancomycin  11/4--     Objective: Vitals:   06/29/24 1812 06/29/24 2010 06/30/24 0326 06/30/24 0800  BP: 120/60 128/72 137/82 117/71  Pulse: 73 69 78 90  Resp: 18 16 18 18   Temp: 98.1 F (36.7 C) 98.2 F (36.8 C) 98.5 F (36.9 C)   TempSrc: Oral Oral Oral   SpO2: 93% 94% 90% 93%  Weight:      Height:        Intake/Output Summary (Last 24 hours) at 06/30/2024 1202 Last data filed at 06/30/2024 0519 Gross per 24 hour  Intake 1200 ml  Output 1950 ml  Net -750 ml   Filed Weights   06/25/24 2217 06/26/24 0303 06/28/24 2000  Weight: 74.1 kg 76.4 kg 77.2 kg    Examination:  General exam: Appears calm and comfortable.  Pleasant.  Flat affect. Respiratory system: Clear to auscultation. Respiratory effort normal.  No added sounds. Cardiovascular system: S1 & S2 heard, RRR.  Gastrointestinal system: Abdomen is nondistended, soft and nontender. No organomegaly or masses felt. Normal bowel sounds  heard.  Foley catheter with clear urine. Central nervous system: Alert and oriented.  Right upper and lower extremity with bowel 2/5.     Data Reviewed: I have personally reviewed following labs and imaging studies  CBC: Recent Labs  Lab 06/25/24 1605 06/26/24 0327 06/27/24 0903 06/30/24 0429  WBC 38.4* 25.5*  17.8* 14.9*  NEUTROABS 35.2*  --  15.4*  --   HGB 17.3* 14.2 13.7 14.5  HCT 54.8* 43.3 42.5 45.7  MCV 83.8 80.5 81.9 82.0  PLT 574* 451* 416* 457*   Basic Metabolic Panel: Recent Labs  Lab 06/25/24 1605 06/26/24 0327 06/27/24 0903 06/28/24 0459 06/30/24 0429  NA 133* 133* 133*  --  139  K 5.1 4.2 4.1  --  3.7  CL 96* 101 102  --  105  CO2 27 23 25   --  27  GLUCOSE 121* 111* 130*  --  87  BUN 31* 27* 21  --  13  CREATININE 1.56* 1.12 0.91 0.83 0.83  CALCIUM 8.9 7.9* 8.2*  --  8.1*   GFR: Estimated Creatinine Clearance: 68.7 mL/min (by C-G formula based on SCr of 0.83 mg/dL). Liver Function Tests: Recent Labs  Lab 06/25/24 1605  AST 25  ALT 40  ALKPHOS 116  BILITOT 0.7  PROT 6.8  ALBUMIN 3.0*   No results for input(s): LIPASE, AMYLASE in the last 168 hours. No results for input(s): AMMONIA in the last 168 hours. Coagulation Profile: Recent Labs  Lab 06/25/24 1605  INR 1.6*   Cardiac Enzymes: No results for input(s): CKTOTAL, CKMB, CKMBINDEX, TROPONINI in the last 168 hours. BNP (last 3 results) No results for input(s): PROBNP in the last 8760 hours. HbA1C: No results for input(s): HGBA1C in the last 72 hours. CBG: Recent Labs  Lab 06/30/24 0742  GLUCAP 110*   Lipid Profile: No results for input(s): CHOL, HDL, LDLCALC, TRIG, CHOLHDL, LDLDIRECT in the last 72 hours. Thyroid  Function Tests: No results for input(s): TSH, T4TOTAL, FREET4, T3FREE, THYROIDAB in the last 72 hours. Anemia Panel: No results for input(s): VITAMINB12, FOLATE, FERRITIN, TIBC, IRON, RETICCTPCT in the last 72 hours. Sepsis Labs: Recent Labs  Lab 06/25/24 2030 06/26/24 0025 06/26/24 0327 06/26/24 0648  LATICACIDVEN 3.4* 2.6* 2.3* 1.8    Recent Results (from the past 240 hours)  Blood Culture (routine x 2)     Status: None   Collection Time: 06/25/24  2:25 PM   Specimen: BLOOD  Result Value Ref Range Status   Specimen  Description BLOOD RIGHT ARM  Final   Special Requests   Final    BOTTLES DRAWN AEROBIC AND ANAEROBIC Blood Culture results may not be optimal due to an inadequate volume of blood received in culture bottles   Culture   Final    NO GROWTH 5 DAYS Performed at [facility], [address] [place], [place] [address]    Report Status 06/30/2024 FINAL  Final  Blood Culture (routine x 2)     Status: Abnormal (Preliminary result)   Collection Time: 06/25/24  2:35 PM   Specimen: BLOOD  Result Value Ref Range Status   Specimen Description   Final    BLOOD LEFT ANTECUBITAL Performed at [facility], [address], [place], [place] [address]    Special Requests   Final    BOTTLES DRAWN AEROBIC AND ANAEROBIC Blood Culture results may not be optimal due to an inadequate volume of blood received in culture bottles Performed at [facility], [address],  [place], [place] [address]    Culture  Setup Time   Final    BOTTLES DRAWN AEROBIC ONLY GRAM POSITIVE COCCI Gram Stain Report Called to,Read Back By and Verified With: C. ROGERS RN 06/26/24 @1203  BY J. WHITE CRITICAL RESULT CALLED TO, READ BACK BY AND VERIFIED WITH: RN C. BLUFORD [phone] @ [phone] FH    Culture (A)  Final    METHICILLIN RESISTANT STAPHYLOCOCCUS AUREUS Sent to [facility] for further susceptibility testing. Performed at [facility], [address] [place], [place] [address]    Report Status PENDING  Incomplete   Organism ID, Bacteria METHICILLIN RESISTANT STAPHYLOCOCCUS AUREUS  Final      Susceptibility   Methicillin resistant staphylococcus aureus - MIC*    CIPROFLOXACIN  4 RESISTANT Resistant     ERYTHROMYCIN >=8 RESISTANT Resistant     GENTAMICIN <=0.5 SENSITIVE Sensitive     OXACILLIN >=4 RESISTANT Resistant     TETRACYCLINE <=1 SENSITIVE Sensitive     VANCOMYCIN  1 SENSITIVE Sensitive     TRIMETH /SULFA  <=10 SENSITIVE Sensitive     CLINDAMYCIN <=0.25 SENSITIVE Sensitive     RIFAMPIN <=0.5 SENSITIVE Sensitive      Inducible Clindamycin NEGATIVE Sensitive     LINEZOLID 2 SENSITIVE Sensitive     * METHICILLIN RESISTANT STAPHYLOCOCCUS AUREUS  Blood Culture ID Panel (Reflexed)     Status: Abnormal   Collection Time: 06/25/24  2:35 PM  Result Value Ref Range Status   Enterococcus faecalis NOT DETECTED NOT DETECTED Final   Enterococcus Faecium NOT DETECTED NOT DETECTED Final   Listeria monocytogenes NOT DETECTED NOT DETECTED Final   Staphylococcus species DETECTED (A) NOT DETECTED Final    Comment: CRITICAL RESULT CALLED TO, READ BACK BY AND VERIFIED WITH: RN C. COOK [phone] @ [phone] FH    Staphylococcus aureus (BCID) DETECTED (A) NOT DETECTED Final    Comment: Methicillin (oxacillin)-resistant Staphylococcus aureus (MRSA). MRSA is predictably resistant to beta-lactam antibiotics (except ceftaroline). Preferred therapy is vancomycin  unless clinically contraindicated. Patient requires contact precautions if  hospitalized. CRITICAL RESULT CALLED TO, READ BACK BY AND VERIFIED WITH: RN C. COOK [phone] @ 1822 FH    Staphylococcus epidermidis NOT DETECTED NOT DETECTED Final   Staphylococcus lugdunensis NOT DETECTED NOT DETECTED Final   Streptococcus species NOT DETECTED NOT DETECTED Final   Streptococcus agalactiae NOT DETECTED NOT DETECTED Final   Streptococcus pneumoniae NOT DETECTED NOT DETECTED Final   Streptococcus pyogenes NOT DETECTED NOT DETECTED Final   A.calcoaceticus-baumannii NOT DETECTED NOT DETECTED Final   Bacteroides fragilis NOT DETECTED NOT DETECTED Final   Enterobacterales NOT DETECTED NOT DETECTED Final   Enterobacter cloacae complex NOT DETECTED NOT DETECTED Final   Escherichia coli NOT DETECTED NOT DETECTED Final   Klebsiella aerogenes NOT DETECTED NOT DETECTED Final   Klebsiella oxytoca NOT DETECTED NOT DETECTED Final   Klebsiella pneumoniae NOT DETECTED NOT DETECTED Final   Proteus species NOT DETECTED NOT DETECTED Final   Salmonella species NOT DETECTED NOT DETECTED Final   Serratia  marcescens NOT DETECTED NOT DETECTED Final   Haemophilus influenzae NOT DETECTED NOT DETECTED Final   Neisseria meningitidis NOT DETECTED NOT DETECTED Final   Pseudomonas aeruginosa NOT DETECTED NOT DETECTED Final   Stenotrophomonas maltophilia NOT DETECTED NOT DETECTED Final   Candida albicans NOT DETECTED NOT DETECTED Final   Candida auris NOT DETECTED NOT DETECTED Final   Candida glabrata NOT DETECTED NOT DETECTED Final   Candida krusei NOT DETECTED NOT DETECTED Final   Candida parapsilosis NOT DETECTED NOT DETECTED Final  Candida tropicalis NOT DETECTED NOT DETECTED Final   Cryptococcus neoformans/gattii NOT DETECTED NOT DETECTED Final   Meth resistant mecA/C and MREJ DETECTED (A) NOT DETECTED Final    Comment: CRITICAL RESULT CALLED TO, READ BACK BY AND VERIFIED WITH: RN KYM AHLE [phone] @ [phone] FH Performed at [facility], [address] [place], [place] [address]   MIC (1 Drug)-     Status: Abnormal (Preliminary result)   Collection Time: 06/25/24  2:35 PM  Result Value Ref Range Status   Min Inhibitory Conc (1 Drug) Preliminary report (A)  Final    Comment: (NOTE) Performed At: [facility] [address] [place], [place] [id number] Jennette Shorter MD Ey:[id number]    Source [id number] MRSA DAPTOMYCIN BLOOD  Final    Comment: Performed at [facility], [address] [place], [place] [address]  MIC Result     Status: Abnormal   Collection Time: 06/25/24  2:35 PM  Result Value Ref Range Status   Result 1 (MIC) Comment (A)  Final    Comment: (NOTE) Methicillin - resistant Staphylococcus aureus Identification performed by account, not confirmed by this laboratory. DAPTOMYCIN Performed At: [facility] [address] [place], [place] [id number] Jennette Shorter MD Ey:[id number]   Urine Culture     Status: Abnormal   Collection Time: 06/25/24  4:01 PM   Specimen: Urine, Random  Result Value Ref Range Status   Specimen Description   Final     URINE, RANDOM Performed at [facility], [address], [place], [place] [address]    Special Requests   Final    NONE Reflexed from [id number] Performed at [facility], [address], [place], [place] [address]    Culture MULTIPLE SPECIES PRESENT, SUGGEST RECOLLECTION (A)  Final   Report Status 06/27/2024 FINAL  Final  MRSA Next Gen by PCR, Nasal     Status: Abnormal   Collection Time: 06/26/24  2:41 AM   Specimen: Nasal Mucosa; Nasal Swab  Result Value Ref Range Status   MRSA by PCR Next Gen DETECTED (A) NOT DETECTED Final    Comment: RESULT CALLED TO, READ BACK BY AND VERIFIED WITH: B. SUPER [phone], VIRAY,J (NOTE) The GeneXpert MRSA Assay (FDA approved for NASAL specimens only), is one component of a comprehensive MRSA colonization surveillance program. It is not intended to diagnose MRSA infection nor to guide or monitor treatment for MRSA infections. Test performance is not FDA approved in patients less than 58 years old. Performed at [facility], [address] [place], [place] [address]   Culture, blood (Routine X 2) w Reflex to ID Panel     Status: None (Preliminary result)   Collection Time: 06/27/24  8:56 AM   Specimen: BLOOD  Result Value Ref Range Status   Specimen Description BLOOD BLOOD RIGHT HAND  Final   Special Requests   Final    BOTTLES DRAWN AEROBIC AND ANAEROBIC Blood Culture adequate volume   Culture   Final    NO GROWTH 3 DAYS Performed at [facility], [address], [place], [place] [address]    Report Status PENDING  Incomplete  Culture, blood (Routine X 2) w Reflex to ID Panel     Status: None (Preliminary result)   Collection Time: 06/27/24  9:03 AM   Specimen: BLOOD  Result Value Ref Range Status   Specimen Description BLOOD RIGHT ANTECUBITAL  Final   Special Requests   Final    BOTTLES DRAWN AEROBIC AND ANAEROBIC  Blood Culture adequate volume   Culture   Final    NO GROWTH 3 DAYS Performed at [facility], [address], [place],  [place] [address]    Report Status PENDING  Incomplete         Radiology Studies: No results found.      Scheduled Meds:  apixaban   2.5 mg Oral BID   baclofen   5 mg Oral TID   Chlorhexidine  Gluconate Cloth  6 each Topical Daily   diltiazem   120 mg Oral Daily   latanoprost   1 drop Both Eyes QHS   midodrine  5 mg Oral TID WC   mupirocin ointment   Nasal BID   sertraline  25 mg Oral Daily   tamsulosin   0.4 mg Oral QPM   timolol   1 drop Both Eyes Daily   Continuous Infusions:  vancomycin  750 mg (06/30/24 0550)     LOS: 5 days      Renato Applebaum, MD [facility]

## 2024-07-01 ENCOUNTER — Ambulatory Visit: Admitting: Urology

## 2024-07-01 DIAGNOSIS — I480 Paroxysmal atrial fibrillation: Secondary | ICD-10-CM | POA: Diagnosis not present

## 2024-07-01 DIAGNOSIS — Z978 Presence of other specified devices: Secondary | ICD-10-CM

## 2024-07-01 DIAGNOSIS — N179 Acute kidney failure, unspecified: Secondary | ICD-10-CM | POA: Diagnosis not present

## 2024-07-01 DIAGNOSIS — N401 Enlarged prostate with lower urinary tract symptoms: Secondary | ICD-10-CM | POA: Diagnosis not present

## 2024-07-01 DIAGNOSIS — Z515 Encounter for palliative care: Secondary | ICD-10-CM

## 2024-07-01 DIAGNOSIS — A419 Sepsis, unspecified organism: Secondary | ICD-10-CM | POA: Diagnosis not present

## 2024-07-01 LAB — CREATININE, SERUM
Creatinine, Ser: 0.69 mg/dL (ref 0.61–1.24)
GFR, Estimated: >60 mL/min (ref >60)

## 2024-07-01 LAB — VANCOMYCIN, TROUGH: Vancomycin Tr: 15 ug/mL (ref 15–20)

## 2024-07-01 MED ORDER — VANCOMYCIN HCL 1750 MG/350ML IV SOLN
1750.0000 mg | INTRAVENOUS | Status: DC
  Administered 2024-07-01 – 2024-07-02 (×2): 1750 mg via INTRAVENOUS
  Filled 2024-07-01 (×4): qty 350

## 2024-07-01 MED ORDER — APIXABAN 5 MG PO TABS
5.0000 mg | ORAL_TABLET | Freq: Two times a day (BID) | ORAL | Status: DC
  Administered 2024-07-01 – 2024-07-02 (×2): 5 mg via ORAL
  Filled 2024-07-01 (×2): qty 1

## 2024-07-01 MED ORDER — INFLUENZA VAC SPLIT HIGH-DOSE 0.5 ML IM SUSY
0.5000 mL | PREFILLED_SYRINGE | INTRAMUSCULAR | Status: AC
  Administered 2024-07-02: 0.5 mL via INTRAMUSCULAR
  Filled 2024-07-01: qty 0.5

## 2024-07-01 NOTE — TOC Progression Note (Signed)
 Transition of Care [facility]) - Progression Note    Patient Details  Name: Mason [name] MRN: [id number] Date of Birth: February 15, 1946  Transition of [facility]) CM/SW Contact  [place] Saddie Kim, [place] Phone Number: 07/01/2024, 10:42 AM  Clinical Narrative:  LCSW updated Montie [phone], extension [phone]) at [facility] on pt. She is aware pt will have PICC line for several weeks of IV antibiotics. Will start authorization tomorrow in anticipation of d/c Wednesday per MD.     Expected Discharge Plan: Skilled Nursing Facility Barriers to Discharge: Continued Medical Work up               Expected Discharge Plan and Services In-house Referral: Clinical Social Work   Post Acute Care Choice: Resumption of Svcs/PTA Provider Living arrangements for the past 2 months: Skilled Nursing Facility                                       Social Drivers of Health (SDOH) Interventions SDOH Screenings   Food Insecurity: No Food Insecurity (06/25/2024)  Housing: Low Risk  (06/25/2024)  Transportation Needs: No Transportation Needs (06/25/2024)  Utilities: Not At Risk (06/25/2024)  Alcohol Screen: Low Risk  (07/08/2021)  Depression (PHQ2-9): Low Risk  (04/14/2023)  Financial Resource Strain: Low Risk  (07/20/2022)  Physical Activity: Insufficiently Active (07/20/2022)  Social Connections: Moderately Integrated (06/25/2024)  Stress: No Stress Concern Present (07/20/2022)  Tobacco Use: Medium Risk (06/25/2024)    Readmission Risk Interventions    06/29/2024   12:51 PM 06/29/2024   12:45 PM 05/15/2024    1:16 PM  Readmission Risk Prevention Plan  Transportation Screening Complete Complete Complete  Home Care Screening Complete Complete Complete  Medication Review (RN CM) Complete Complete Complete

## 2024-07-01 NOTE — Plan of Care (Signed)
  Problem: Education: Goal: Knowledge of General Education information will improve Description: Including pain rating scale, medication(s)/side effects and non-pharmacologic comfort measures Outcome: Progressing   Problem: Health Behavior/Discharge Planning: Goal: Ability to manage health-related needs will improve Outcome: Progressing   Problem: Clinical Measurements: Goal: Ability to maintain clinical measurements within normal limits will improve Outcome: Progressing Goal: Will remain free from infection Outcome: Progressing   Problem: Nutrition: Goal: Adequate nutrition will be maintained Outcome: Progressing   Problem: Coping: Goal: Level of anxiety will decrease Outcome: Progressing   Problem: Elimination: Goal: Will not experience complications related to bowel motility Outcome: Progressing

## 2024-07-01 NOTE — Consult Note (Signed)
 Consultation Note Date: 07/01/2024   Patient Name: Mason [name]  DOB: 09/30/1945  MRN: [id number]  Age / Sex: 78 y.o., male  PCP: Cleatus Arlyss RAMAN, MD Referring Physician: Ricky Fines, MD  Reason for Consultation:  pt made multiple comments about just wanting to stop treatments, tired of fighting, refusing meds  HPI/Patient Profile: 78 y.o. male  with past medical history of stroke with residual right sided deficits, indwelling Foley, A-fib, BPH, hypertension, portal thrombosis, admitted on 06/25/2024 with sepsis due to MRSA bacteremia.  He briefly required Levophed that is now off.  Palliative medicine consulted for goals of care.  Primary Decision Maker NEXT OF KIN-if you are unable to make decisions for himself then his son would be his surrogate decision maker  Discussion: Chart reviewed including labs, progress notes, imaging from this and previous encounters.  Patient presented with lactic acidosis.  He had a positive UA.  Blood cultures were positive for MRSA.  He is scheduled for a TEE today as source has not been identified.  He had a CT of the chest and abdomen which was reviewed by me significant for debris in the trachea with mucous plugging worrisome for pneumonia.  He has been evaluated by SLP with no significant aspiration or dysphagia noted. On evaluation Mr. [name] is awake alert and oriented.  He complains of a dry mouth as he has been n.p.o. since midnight anticipating TEE. He was living at [facility] prior to admission, however per [facility] notes plan is for him to discharge to long-term care at [facility].  Mr. Preece is glad about this.  He shared the difficulty of living in a nursing facility.  Notes that he often goes long periods where no one response to his calls for help.  He is completely dependent for care given his right side paresis.  He enjoys working with physical  therapy and feels that he has made some improvements.  He had hoped to return home if he could gain function of at least one of his right sided extremities, however he acknowledges that this is very unlikely. He previously worked as an acupuncturist.  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 He has a son and a daughter.  If he were unable to make his own decisions he would want his son to be his decision maker. We discussed his current acute and chronic issues. He understands that he has bacteremia and will need long-term antibiotics.  He is agreeable to this. We discussed his overall quality of life.  He finds it to be poor.  It is hard for him to be dependent on other people for his care and he does not enjoy living in the nursing facility.   We discussed the statements that he made previously about wanting to stop treatments and refusing medications.  He shared that when he made those statements he did feel that way however after more thought he changed his mind as he  felt it would make his family very sad if he stopped treatments and proceeded with end-of-life.  He notes that he does find joy in spending time with his family and wishes to continue to do this. We discussed his feelings about his future medical care and if he were to decline again with a significant life threatening infection or other illness that could potentially take his life how he would feel about life-prolonging treatments such as IV fluids IV antibiotics etc. He is uncertain.  He notes that he would possibly consider not doing those things and just wanting to be comfortable and being allowed to die, however he would want to make the decision as it occurred he is not ready to make a decision about that right now.  For now he would want to continue life-prolonging measures. I encouraged him to continue to discuss his feelings about these decisions with his  family and his care providers.  He is agreeable to referral for palliative follow-up at discharge to aid in these continued conversations.    SUMMARY OF RECOMMENDATIONS -Sepsis- bacteremia-patient wishes to continue to treat what is treatable - Goals of care are for stabilization and discharge back to long-term care - TOC order placed for outpatient palliative follow-up  Code Status/Advance Care Planning:   Code Status: Limited: Do not attempt resuscitation (DNR) -DNR-LIMITED -Do Not Intubate/DNI     Prognosis:   Unable to determine  Discharge Planning: LTC with Palliative  Primary Diagnoses: Present on Admission:  Severe sepsis (HCC)  Atrial fibrillation (HCC)  BPH (benign prostatic hyperplasia)  Essential hypertension  UTI (urinary tract infection)  PNA (pneumonia)  AKI (acute kidney injury)   Review of Systems  Constitutional:  Positive for fatigue.    Physical Exam Vitals and nursing note reviewed.  Cardiovascular:     Rate and Rhythm: Normal rate.  Pulmonary:     Effort: Pulmonary effort is normal.  Skin:    General: Skin is warm and dry.  Neurological:     Mental Status: He is alert and oriented to person, place, and time.     Comments: R side paralysis  Psychiatric:        Thought Content: Thought content normal.     Vital Signs: BP 118/63 (BP Location: Left Arm)   Pulse 70   Temp 98.4 F (36.9 C) (Oral)   Resp 17   Ht 5' 7.5 (1.715 m)   Wt 77.2 kg   SpO2 94%   BMI 26.26 kg/m  Pain Scale: 0-10 POSS *See Group Information*: 1-Acceptable,Awake and alert Pain Score: 0-No pain   SpO2: SpO2: 94 % O2 Device:SpO2: 94 % O2 Flow Rate: .O2 Flow Rate (L/min): 2 L/min  IO: Intake/output summary:  Intake/Output Summary (Last 24 hours) at 07/01/2024 1338 Last data filed at 07/01/2024 0631 Gross per 24 hour  Intake 120 ml  Output 1700 ml  Net -1580 ml    LBM: Last BM Date :  (unknown) Baseline Weight: Weight: 78 kg Most recent weight: Weight:  77.2 kg       Thank you for this consult. Palliative medicine will continue to follow and assist as needed.  Time Total: 90 minutes Signed by: Cassondra Stain, AGNP-C Palliative Medicine  Time includes:   Preparing to see the patient (e.g., review of tests) Obtaining and/or reviewing separately obtained history Performing a medically necessary appropriate examination and/or evaluation Counseling and educating the patient/family/caregiver Ordering medications, tests, or procedures Referring and communicating with other health care professionals (when not  reported separately) Documenting clinical information in the electronic or other health record Independently interpreting results (not reported separately) and communicating results to the patient/family/caregiver Care coordination (not reported separately) Clinical documentation   Please contact Palliative Medicine Team phone at [phone] for questions and concerns.  For individual provider: See Tracey

## 2024-07-01 NOTE — Progress Notes (Signed)
 Progress Note   Patient: Mason [name] FMW:[id number] DOB: 13-Feb-1946 DOA: 06/25/2024     6 DOS: the patient was seen and examined on 07/01/2024   Brief hospital admission narrative: Per H&P written by Dr. Pearlean on 06/25/2024 Mason [name] is a 78 y.o. 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 Patient was brought to the ED reports of elevated white blood count of 42,000 and fever.  He also presented with headache, reports intermittent right eye pain, that self resolved, and has been going on for a long time, he saw an eye doctor and was told nothing was wrong with his eye.  He denies eye pain today.  He has a chronic indwelling Foley catheter, it was exchanged earlier today, and had blood.  Patient tells me prior to the exchange he did not have any blood in his urine.  Also reports a cough.   ED Course: Temperature max-99.5.  Heart rate 104-119.  Respiratory rate 13-26.  O2 sats 90 to 97% on room air.  Systolic blood pressure  92 to 132.  Lactic acidosis 3> 5> 2.9> 3.4. WBC 38.4. Creatinine elevated 1.56. UA with positive nitrites and leukocytes, many bacteria. Head CT-no acute abnormality  CT chest abdomen pelvis without contrast- Layering debris in the trachea with mucous plugging and lower lobe bronchial wall thickening, reflecting airway inflammation/infection. Extensive left colon diverticulosis with mild sigmoid diverticulitis. Blood and urine cultures obtained. IV fluid bolus given IV cefepime , and azithromycin started.  Assessment and plan 1-severe sepsis with septic shock in the setting of UTI/MRSA bacteremia - Sepsis present at time of admission with tachycardia, leukocytosis, endorgan dysfunction with acute kidney injury and the presence of lactic acidosis. - Patient blood culture suggesting the presence of gram-positive cocci - Patient with positive MRSA  PCR - Continue IV vancomycin  - 2D echo not demonstrating large vegetations, grade 1 diastolic dysfunction and no wall motion abnormalities. -Following recommendations by ID service will pursuit evaluation with TEE (Planned for 07/02/2024) - Afebrile and in no acute distress. - No longer requiring pressors support; overall sepsis features resolved.  - Continue supportive care. - Repeat blood culture without growth. - Waiting for TEE results and clearance by ID prior to PICC line placement.  2-paroxysmal atrial fibrillation - Require transient use of Cardizem  drip - Rate controlled and currently sinus; will continue Cardizem  and orally; transitioning to extended release form once a day tablet. - Continue Eliquis .  3-concern for possible pneumonia - No productive coughing spells and good saturation on room air -Current antibiotics will cover for any lung infections - Follow recommendations by speech therapy.  4-metabolic encephalopathy - In the setting of acute infection - Head CT negative for acute abnormality - Continue constant orientation and supportive care - On today's evaluation patient mentation back to his baseline.  5-acute kidney injury - In the setting of sepsis and UTI - Continue aggressive fluid resuscitation - Follow renal function trend. - Continue to minimize the use of nephrotoxic agents, avoid contrast and hypotension.  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  7-history of urinary retention/BPH and chronic indwelling Foley catheter - Patient UTI associated with Foley catheter - Foley catheter in place (recently exchanged) - Continue Flomax  - Advised to maintain adequate oral hydration -Continue antibiotics as mentioned above; which will also cover for urinary infection.  Subjective:  No  fever, no chest pain, no palpitations, reports no nausea or  vomiting.  Patient hungry on examination as he has been n.p.o. for anticipated TEE.  Physical Exam: Vitals:   07/01/24 0358 07/01/24 0735 07/01/24 1212 07/01/24 1421  BP: 120/76 121/81 118/63 127/72  Pulse: 66 72 70 71  Resp: 18 16 17 18   Temp: (!) 97.5 F (36.4 C) 97.7 F (36.5 C) 98.4 F (36.9 C) 97.6 F (36.4 C)  TempSrc: Oral Oral Oral Oral  SpO2: 95% 95% 94% 92%  Weight:      Height:       General exam: Alert, awake, oriented x 3; no overnight events. Respiratory system: Clear to auscultation. Respiratory effort normal.  Good saturation on room air. Cardiovascular system: Rate controlled, no rubs, no gallops, no JVD on exam. Gastrointestinal system: Abdomen is nondistended, soft and nontender. No organomegaly or masses felt. Normal bowel sounds heard. Central nervous system: No new focal deficits; right-sided hemiparesis appreciated on exam. Extremities: No cyanosis, clubbing or edema Skin: No petechiae. Psychiatry: Judgement and insight appear normal.  Flat affect appreciated on exam.  Latest Data Reviewed: Lactic acid: 2.3 CBC: WBCs 25.5, hemoglobin 14.21 platelet count 451K Basic metabolic panel: Sodium 133, potassium 4.2, chloride 101, bicarb 23, BUN 27, creatinine 1.12 and GFR >60 Vancomycin  trough: 15  Family Communication: Family and significant other is at bedside. Disposition: Status is: Inpatient Remains inpatient appropriate because: Continue IV antibiotics.  Time:50 minutes.  Author: Eric Nunnery, MD 07/01/2024 3:38 PM  For on call review [email or web address].

## 2024-07-01 NOTE — Progress Notes (Signed)
 Pharmacy Antibiotic Note  Mason [name] is a 78 y.o. male admitted on 06/25/2024 with pneumonia and bacteremia/UTI.  Pharmacy has been consulted for vancomycin  dosing.  Patient currently afebrile, wbc slightly elevated at 14.9. Renal function continues to be normal at stable at 0.69. Levels checked today.   Vanc peak 23 and vanc trough 15 gives us  an AUC of 442. Will adjust dosing to to more aggressive and convenient dosing  Plan: Change to Vancomycin  1750 mg Q24H (new eAUC 515) Monitor labs, c/s, and vanco levels as indicated. Planning TEE today 11/10  Height: 5' 7.5 (171.5 cm) Weight: 77.2 kg (170 lb 3.1 oz) IBW/kg (Calculated) : 67.25  Temp (24hrs), Avg:98 F (36.7 C), Min:97.5 F (36.4 C), Max:98.6 F (37 C)  Recent Labs  Lab 06/25/24 1605 06/25/24 1849 06/25/24 2030 06/26/24 0025 06/26/24 0327 06/26/24 9351 06/27/24 0903 06/28/24 0459 06/30/24 0429 06/30/24 2059 07/01/24 0419  WBC 38.4*  --   --   --  25.5*  --  17.8*  --  14.9*  --   --   CREATININE 1.56*  --   --   --  1.12  --  0.91 0.83 0.83  --  0.69  LATICACIDVEN 5.0* 2.9* 3.4* 2.6* 2.3* 1.8  --   --   --   --   --   VANCOTROUGH  --   --   --   --   --   --   --   --   --   --  15  VANCOPEAK  --   --   --   --   --   --   --   --   --  23*  --     Estimated Creatinine Clearance: 71.3 mL/min (by C-G formula based on SCr of 0.69 mg/dL).    Allergies  Allergen Reactions   Shellfish Allergy Shortness Of Breath, Rash and Other (See Comments)    GI upset Difficulty breathing  Shrimp and crab   Celexa  [Citalopram ] Other (See Comments)    Aggression    Levsin [Hyoscyamine] Other (See Comments)    Urinary retention   Librax [Chlordiazepoxide-Clidinium] Other (See Comments)    Urinary retention   Ak-Mycin [Erythromycin] Other (See Comments)    GI upset   Mobic  [Meloxicam ] Other (See Comments)    GI upset   Penicillins Itching, Rash and Dermatitis    Received Unasyn  earlier without acute reaction     Antimicrobials this admission: Vanco 11/6 >> Cefepime  11/4 >> 11/6 Azith 11/4 >> 11/5 Flagyl  11/4 >> 11/6  Microbiology results: 11/6 Bcx: No growth < 24 hours 11/4 Bcx: staph aureus BCID: MRSA  MRSA PCR: +  Thank you for allowing pharmacy to be a part of this patient's care.  Dempsey Blush PharmD., BCPS Clinical Pharmacist 07/01/2024 11:49 AM

## 2024-07-01 NOTE — Plan of Care (Signed)
     Cardiology consulted to perform TEE for MRSA bacteremia. TTE normal. He was previously scheduled for TEE on 06/28/2024 but had to be canceled as patient refused. Now he is agreeable with the treatment plan including TEE. Will keep him NPO after midnight for TEE tomorrow afternoon.  Marayah Higdon P Beva Remund, MD  07/01/2024 4:18 PM

## 2024-07-02 ENCOUNTER — Encounter (HOSPITAL_COMMUNITY): Payer: Self-pay | Admitting: Internal Medicine

## 2024-07-02 ENCOUNTER — Other Ambulatory Visit (HOSPITAL_COMMUNITY): Payer: Self-pay | Admitting: *Deleted

## 2024-07-02 ENCOUNTER — Encounter (HOSPITAL_COMMUNITY)
Admission: EM | Disposition: A | Discharge status: Skilled Nursing Facility | Payer: Self-pay | Source: Skilled Nursing Facility | Attending: Internal Medicine

## 2024-07-02 ENCOUNTER — Inpatient Hospital Stay (HOSPITAL_COMMUNITY): Admitting: Anesthesiology

## 2024-07-02 ENCOUNTER — Inpatient Hospital Stay (HOSPITAL_COMMUNITY)

## 2024-07-02 DIAGNOSIS — I10 Essential (primary) hypertension: Secondary | ICD-10-CM

## 2024-07-02 DIAGNOSIS — Z87891 Personal history of nicotine dependence: Secondary | ICD-10-CM

## 2024-07-02 DIAGNOSIS — R6520 Severe sepsis without septic shock: Secondary | ICD-10-CM | POA: Diagnosis not present

## 2024-07-02 DIAGNOSIS — R7881 Bacteremia: Secondary | ICD-10-CM

## 2024-07-02 DIAGNOSIS — A419 Sepsis, unspecified organism: Secondary | ICD-10-CM | POA: Diagnosis not present

## 2024-07-02 DIAGNOSIS — Z01818 Encounter for other preprocedural examination: Secondary | ICD-10-CM | POA: Diagnosis not present

## 2024-07-02 DIAGNOSIS — N401 Enlarged prostate with lower urinary tract symptoms: Secondary | ICD-10-CM | POA: Diagnosis not present

## 2024-07-02 DIAGNOSIS — I480 Paroxysmal atrial fibrillation: Secondary | ICD-10-CM | POA: Diagnosis not present

## 2024-07-02 DIAGNOSIS — N179 Acute kidney failure, unspecified: Secondary | ICD-10-CM | POA: Diagnosis not present

## 2024-07-02 HISTORY — PX: TEE WITHOUT CARDIOVERSION: SHX5443

## 2024-07-02 LAB — CBC
HCT: 47.5 % (ref 39.0–52.0)
Hemoglobin: 15.1 g/dL (ref 13.0–17.0)
MCH: 26.1 pg (ref 26.0–34.0)
MCHC: 31.8 g/dL (ref 30.0–36.0)
MCV: 82.2 fL (ref 80.0–100.0)
Platelets: 464 K/uL — ABNORMAL HIGH (ref 150–400)
RBC: 5.78 MIL/uL (ref 4.22–5.81)
RDW: 20.4 % — ABNORMAL HIGH (ref 11.5–15.5)
WBC: 13.9 K/uL — ABNORMAL HIGH (ref 4.0–10.5)
nRBC: 0 % (ref 0.0–0.2)

## 2024-07-02 LAB — MINIMUM INHIBITORY CONC. (1 DRUG)

## 2024-07-02 LAB — MAGNESIUM: Magnesium: 2.2 mg/dL (ref 1.7–2.4)

## 2024-07-02 LAB — CULTURE, BLOOD (ROUTINE X 2)
Culture: NO GROWTH
Culture: NO GROWTH
Special Requests: ADEQUATE
Special Requests: ADEQUATE

## 2024-07-02 LAB — BASIC METABOLIC PANEL WITH GFR
Anion gap: 7 (ref 5–15)
BUN: 13 mg/dL (ref 8–23)
CO2: 27 mmol/L (ref 22–32)
Calcium: 8.3 mg/dL — ABNORMAL LOW (ref 8.9–10.3)
Chloride: 105 mmol/L (ref 98–111)
Creatinine, Ser: 0.76 mg/dL (ref 0.61–1.24)
GFR, Estimated: >60 mL/min (ref >60)
Glucose, Bld: 86 mg/dL (ref 70–99)
Potassium: 4 mmol/L (ref 3.5–5.1)
Sodium: 139 mmol/L (ref 135–145)

## 2024-07-02 LAB — MIC RESULT

## 2024-07-02 MED ORDER — HEPARIN (PORCINE) 25000 UT/250ML-% IV SOLN
1200.0000 [IU]/h | INTRAVENOUS | Status: DC
  Administered 2024-07-02: 1100 [IU]/h via INTRAVENOUS
  Administered 2024-07-03 – 2024-07-04 (×2): 1200 [IU]/h via INTRAVENOUS
  Filled 2024-07-02 (×3): qty 250

## 2024-07-02 MED ORDER — LIDOCAINE HCL (CARDIAC) PF 100 MG/5ML IV SOSY
PREFILLED_SYRINGE | INTRAVENOUS | Status: DC | PRN
  Administered 2024-07-02: 100 mg via INTRATRACHEAL

## 2024-07-02 MED ORDER — BUTAMBEN-TETRACAINE-BENZOCAINE 2-2-14 % EX AERO
INHALATION_SPRAY | CUTANEOUS | Status: AC
Start: 2024-07-02 — End: 2024-07-02
  Filled 2024-07-02: qty 5

## 2024-07-02 MED ORDER — PROPOFOL 10 MG/ML IV BOLUS
INTRAVENOUS | Status: DC | PRN
  Administered 2024-07-02 (×2): 50 mg via INTRAVENOUS

## 2024-07-02 MED ORDER — SODIUM CHLORIDE 0.9 % IV SOLN
INTRAVENOUS | Status: DC | PRN
Start: 2024-07-02 — End: 2024-07-02

## 2024-07-02 MED ORDER — PROPOFOL 500 MG/50ML IV EMUL
INTRAVENOUS | Status: DC | PRN
  Administered 2024-07-02: 100 ug/kg/min via INTRAVENOUS

## 2024-07-02 NOTE — Progress Notes (Signed)
 PHARMACY - ANTICOAGULATION CONSULT NOTE  Pharmacy Consult for heparin  Indication: atrial fibrillation  Allergies  Allergen Reactions   Shellfish Allergy Shortness Of Breath, Rash and Other (See Comments)    GI upset Difficulty breathing  Shrimp and crab   Celexa  [Citalopram ] Other (See Comments)    Aggression    Levsin [Hyoscyamine] Other (See Comments)    Urinary retention   Librax [Chlordiazepoxide-Clidinium] Other (See Comments)    Urinary retention   Ak-Mycin [Erythromycin] Other (See Comments)    GI upset   Mobic  [Meloxicam ] Other (See Comments)    GI upset   Penicillins Itching, Rash and Dermatitis    Received Unasyn  earlier without acute reaction    Patient Measurements: Height: 5' 7.5 (171.5 cm) Weight: 77.2 kg (170 lb 3.1 oz) IBW/kg (Calculated) : 67.25 HEPARIN  DW (KG): 76.4  Vital Signs: Temp: 97.6 F (36.4 C) (11/11 1433) Temp Source: Oral (11/11 1354) BP: 142/70 (11/11 1530) Pulse Rate: 60 (11/11 1433)  Labs: Recent Labs    06/30/24 0429 07/01/24 0419 07/02/24 0420  HGB 14.5  --  15.1  HCT 45.7  --  47.5  PLT 457*  --  464*  CREATININE 0.83 0.69 0.76    Estimated Creatinine Clearance: 71.3 mL/min (by C-G formula based on SCr of 0.76 mg/dL).   Medical History: Past Medical History:  Diagnosis Date   Actinic keratosis 04/02/2013   L sup scapula - bx proven   Actinic keratosis 04/02/2013   R med ant deltoid - bx proven   Allergy    Anal fissure    Anxiety    Basal cell carcinoma    back - treated in the past    Basal cell carcinoma 05/02/2009   left supratip, Mohs   Cancer (HCC)    basal cell removed x4   Cataract    bil eyes   Depression    Diverticulosis of colon    GERD (gastroesophageal reflux disease)    esophageal narrowing   Glaucoma    Hiatal hernia    IBS (irritable bowel syndrome)    Insomnia    Internal hemorrhoids     Medications:  Medications Prior to Admission  Medication Sig Dispense Refill Last Dose/Taking    Baclofen  5 MG TABS Take 1 tablet by mouth 3 (three) times daily.   06/24/2024 Evening   cetirizine (ZYRTEC) 10 MG tablet Take 10 mg by mouth daily.   06/24/2024 Morning   ELIQUIS  2.5 MG TABS tablet Take 2.5 mg by mouth 2 (two) times daily. 0800, 2000   06/24/2024 at  8:00 PM   [EXPIRED] guaiFENesin (MUCINEX) 600 MG 12 hr tablet Take 600 mg by mouth 2 (two) times daily. For 5 days   06/24/2024 Evening   lactose free nutrition (BOOST) LIQD Take 240 mLs by mouth 3 (three) times daily between meals.   06/24/2024 Evening   latanoprost  (XALATAN ) 0.005 % ophthalmic solution Place 1 drop into both eyes at bedtime.   06/24/2024 Bedtime   melatonin 3 MG TABS tablet Take 1 tablet (3 mg total) by mouth at bedtime as needed. (Patient taking differently: Take 3 mg by mouth at bedtime as needed (insomnia).)   Unknown   methocarbamol  (ROBAXIN ) 500 MG tablet Take 1 tablet (500 mg total) by mouth every 8 (eight) hours as needed for muscle spasms. 90 tablet 0 Unknown   nystatin  ointment (MYCOSTATIN ) Apply 1 Application topically every 12 (twelve) hours as needed (chronic fungal rash). Apply to glans penis/scrotum   Unknown   polyethylene  glycol (MIRALAX  / GLYCOLAX ) 17 g packet Take 17 g by mouth 2 (two) times daily.   06/24/2024 Evening   pregabalin  (LYRICA ) 50 MG capsule Take 50 mg by mouth 2 (two) times daily.   06/24/2024 Evening   PROCTOZONE -HC 2.5 % rectal cream Place 1 Application rectally every 6 (six) hours as needed for hemorrhoids (after bowel movements).   Unknown   senna-docusate (SENOKOT-S) 8.6-50 MG tablet Take 1 tablet by mouth daily.   06/24/2024 Morning   sertraline (ZOLOFT) 25 MG tablet Take 25 mg by mouth daily.   06/24/2024 Morning   tamsulosin  (FLOMAX ) 0.4 MG CAPS capsule Take 1 capsule (0.4 mg total) by mouth daily after supper. (Patient taking differently: Take 0.4 mg by mouth every evening.)   06/24/2024 Evening   timolol  (TIMOPTIC ) 0.25 % ophthalmic solution Place 1 drop into both eyes daily.    06/24/2024 Morning    Assessment: Pharmacy consulted to dose heparin  in patient with atrial fibrillation.  He is on Eliquis  with last dose 11/11 @ 0835.  Patient is now needing endoscopy so holding Eliquis  and starting heparin  infusion. Will need to dose based on aPTT until correlation with heparin  levels  Goal of Therapy:  Heparin  level 0.3-0.7 units/ml aPTT 66-102 seconds Monitor platelets by anticoagulation protocol: Yes   Plan:  Start heparin  infusion at 2030 due to previous Eliquis  dose Start heparin  infusion at 1100 units/hr Check anti-Xa level in 8 hours and daily while on heparin  Continue to monitor H&H and platelets  Elspeth Sour, PharmD Clinical Pharmacist 07/02/2024 4:34 PM

## 2024-07-02 NOTE — Progress Notes (Signed)
   [facility] has been requested to perform a transesophageal echocardiogram on Mason [name] for bacteremia.    The patient does NOT have any absolute or relative contraindications to a Transesophageal Echocardiogram (TEE).  The patient has: No other conditions that may impact this procedure. He is having occasional PVC's on telemetry and episodes of trigeminy. Labs today show K+ at 4.0. WIll add on Mg to AM labs.   After careful review of history and examination, the risks and benefits of transesophageal echocardiogram have been explained including risks of esophageal damage, perforation (1:10,000 risk), bleeding, pharyngeal hematoma as well as other potential complications associated with conscious sedation including aspiration, arrhythmia, respiratory failure and death. Alternatives to treatment were discussed, questions were answered. Patient is willing to proceed.   He previously declined to proceed last week but is now in agreement to proceed. A&Ox4 at the time of this encounter. Will schedule for today at 1400 with Dr. Mallipeddi.   Signed, [name], PA-C  07/02/2024 8:29 AM

## 2024-07-02 NOTE — Progress Notes (Signed)
 Nurse at bedside consent has been signed by patient and myself as a witness and is placed in patients patient chart

## 2024-07-02 NOTE — Plan of Care (Signed)
   Progress Note  Patient Name: [name] Date of Encounter: 07/02/2024  Primary Cardiologist: Redell Cave, MD  Difficulty advancing TEE probe into the esophagus. Significant resistance encountered. Maneuvers like jaw thrust, flexing the head and visualizing probe under direct vision of Glide scope/video laryngoscopy attempted with no success. TEE canceled. Needs GI consult and EGD prior to re-scheduling for TEE.  Signed, Diannah SHAUNNA Maywood, MD  07/02/2024, 2:40 PM

## 2024-07-02 NOTE — Consult Note (Signed)
 Gastroenterology Consult   Referring Provider: No ref. provider found Primary Care Physician:  Cleatus Arlyss RAMAN, MD Primary Gastroenterologist:  not established   Patient ID: Mason [name]; [id number]; [birth year]   Admit date: 06/25/2024  LOS: 7 days   Date of Consultation: 07/02/2024  Reason for Consultation:  needs diagnostic EGD for TEE  History of Present Illness   Mason [name] is a 78 y.o. year old male with A fib, BPH, HTN, portal thrombosis, stroke with residual R sided deficits and aphasia who presented to the hospital this admission with elevated WBC of 42k, fever, headache, R eye pain. Patient had attempted TEE today due to concern for 2D echo with large vegetations, TEE unable to be performed as probe could not be advance, GI Consulted for possible diagnostic EGD.  T max 99.5, HR 104-119 BP systolic range 92-132 Lactic acid 3>5>2.9>3.4 WBC 38.4  Creatinine 1.56. UA with positive nitrites and leukocytes, many bacteria. Head CT negative  CT chest abdomen pelvis without contrast- Layering debris in the trachea with mucous plugging and lower lobe bronchial wall thickening, reflecting airway inflammation/infection. Extensive left colon diverticulosis with mild sigmoid diverticulitis. Blood and urine cultures obtained.  IV cefepime , and azithromycin started.  Consult:  Patient denies any dysphagia, odynophagia or difficulty with eating. No GERD symptoms. Denies any recent antibiotic/steroid exposure prior to admission. He has no GI symptoms. He states he does not want to attempt TEE again and is not interested in doing EGD. He tells me he is tired of going through procedures and does not wish to pursue any further procedures at this time.   Last EGD:  2022 normal    Past Medical History:  Diagnosis Date   Actinic keratosis 04/02/2013   L sup scapula - bx proven   Actinic keratosis 04/02/2013   R med ant deltoid - bx proven   Allergy    Anal fissure     Anxiety    Basal cell carcinoma    back - treated in the past    Basal cell carcinoma 05/02/2009   left supratip, Mohs   Cancer (HCC)    basal cell removed x4   Cataract    bil eyes   Depression    Diverticulosis of colon    GERD (gastroesophageal reflux disease)    esophageal narrowing   Glaucoma    Hiatal hernia    IBS (irritable bowel syndrome)    Insomnia    Internal hemorrhoids     Past Surgical History:  Procedure Laterality Date   ANAL SPHINCTEROTOMY  03/1995   fistulotomy   APPENDECTOMY     BASAL CELL CARCINOMA EXCISION     on back   BICEPT TENODESIS Right 09/27/2004   decompression   COLONOSCOPY  04/1999   internal hemmroids,12-11-08 colon tics and hems only    ESOPHAGOGASTRODUODENOSCOPY  04/1999   with esophagitis and stricture   MOHS SURGERY  09/02/2008   L supratip of nose with flap,basal cell cancer [facility]/MOHS   MOUTH SURGERY  01/2020   TONGUE SURGERY  2017   had small place removed, noncancerous   TONSILLECTOMY AND ADENOIDECTOMY     remote   UPPER GASTROINTESTINAL ENDOSCOPY     WRIST SURGERY Right    WRIST SURGERY     scar tissue removed    Prior to Admission medications   Medication Sig Start Date End Date Taking? Authorizing Provider  Baclofen  5 MG TABS Take 1 tablet by mouth 3 (three) times daily. 03/14/24  Yes [provider]  cetirizine (ZYRTEC) 10 MG tablet Take 10 mg by mouth daily.   Yes [provider]  ELIQUIS  2.5 MG TABS tablet Take 2.5 mg by mouth 2 (two) times daily. 0800, 2000 03/14/24  Yes [provider]  lactose free nutrition (BOOST) LIQD Take 240 mLs by mouth 3 (three) times daily between meals.   Yes [provider]  latanoprost  (XALATAN ) 0.005 % ophthalmic solution Place 1 drop into both eyes at bedtime. 01/25/24  Yes [provider]  melatonin 3 MG TABS tablet Take 1 tablet (3 mg total) by mouth at bedtime as needed. Patient taking differently: Take 3 mg by mouth at bedtime as needed  (insomnia). 05/05/23  Yes Samtani, Jai-Gurmukh, MD  methocarbamol  (ROBAXIN ) 500 MG tablet Take 1 tablet (500 mg total) by mouth every 8 (eight) hours as needed for muscle spasms. 05/16/24  Yes Shahmehdi, Adriana LABOR, MD  nystatin  ointment (MYCOSTATIN ) Apply 1 Application topically every 12 (twelve) hours as needed (chronic fungal rash). Apply to glans penis/scrotum   Yes [provider]  polyethylene glycol (MIRALAX  / GLYCOLAX ) 17 g packet Take 17 g by mouth 2 (two) times daily. 05/05/23  Yes Samtani, Jai-Gurmukh, MD  pregabalin  (LYRICA ) 50 MG capsule Take 50 mg by mouth 2 (two) times daily. 04/18/24  Yes [provider]  PROCTOZONE -HC 2.5 % rectal cream Place 1 Application rectally every 6 (six) hours as needed for hemorrhoids (after bowel movements). 04/29/24  Yes [provider]  senna-docusate (SENOKOT-S) 8.6-50 MG tablet Take 1 tablet by mouth daily.   Yes [provider]  sertraline (ZOLOFT) 25 MG tablet Take 25 mg by mouth daily. 06/12/24  Yes [provider]  tamsulosin  (FLOMAX ) 0.4 MG CAPS capsule Take 1 capsule (0.4 mg total) by mouth daily after supper. Patient taking differently: Take 0.4 mg by mouth every evening. 05/05/23  Yes Samtani, Jai-Gurmukh, MD  timolol  (TIMOPTIC ) 0.25 % ophthalmic solution Place 1 drop into both eyes daily. 04/11/24  Yes [provider]    Current Facility-Administered Medications  Medication Dose Route Frequency Provider Last Rate Last Admin   acetaminophen  (TYLENOL ) tablet 650 mg  650 mg Oral Q6H PRN Ricky Fines, MD   650 mg at 07/02/24 9470   Or   acetaminophen  (TYLENOL ) suppository 650 mg  650 mg Rectal Q6H PRN Ricky Fines, MD       apixaban  (ELIQUIS ) tablet 5 mg  5 mg Oral BID Tanda Dempsey SAUNDERS, RPH   5 mg at 07/02/24 9164   baclofen  (LIORESAL ) tablet 5 mg  5 mg Oral TID Ricky Fines, MD   5 mg at 07/02/24 9162   Chlorhexidine  Gluconate Cloth 2 % PADS 6 each  6 each Topical Daily Ricky Fines, MD   6  each at 07/02/24 0843   diltiazem  (CARDIZEM  CD) 24 hr capsule 120 mg  120 mg Oral Daily Ricky Fines, MD   120 mg at 07/02/24 0836   guaiFENesin-dextromethorphan (ROBITUSSIN DM) 100-10 MG/5ML syrup 5 mL  5 mL Oral Q4H PRN Ricky Fines, MD       latanoprost  (XALATAN ) 0.005 % ophthalmic solution 1 drop  1 drop Both Eyes QHS Ricky Fines, MD   1 drop at 07/01/24 2206   midodrine (PROAMATINE) tablet 5 mg  5 mg Oral TID WC Ricky Fines, MD   5 mg at 07/02/24 1147   mupirocin ointment (BACTROBAN) 2 %   Nasal BID Ricky Fines, MD   Given at 07/02/24 9160   ondansetron  (ZOFRAN )  tablet 4 mg  4 mg Oral Q6H PRN Ricky Fines, MD       Or   ondansetron  (ZOFRAN ) injection 4 mg  4 mg Intravenous Q6H PRN Ricky Fines, MD       Oral care mouth rinse  15 mL Mouth Rinse PRN Ricky Fines, MD       polyethylene glycol (MIRALAX  / GLYCOLAX ) packet 17 g  17 g Oral Daily PRN Ricky Fines, MD       sertraline (ZOLOFT) tablet 25 mg  25 mg Oral Daily Ricky Fines, MD   25 mg at 07/02/24 9162   tamsulosin  (FLOMAX ) capsule 0.4 mg  0.4 mg Oral QPM Ricky Fines, MD   0.4 mg at 07/01/24 1730   timolol  (TIMOPTIC ) 0.25 % ophthalmic solution 1 drop  1 drop Both Eyes Daily Ricky Fines, MD   1 drop at 07/02/24 9160   traZODone  (DESYREL ) tablet 50 mg  50 mg Oral QHS PRN Ricky Fines, MD   50 mg at 07/01/24 2206   vancomycin  (VANCOREADY) IVPB 1750 mg/350 mL  1,750 mg Intravenous Q24H Tanda Dempsey SAUNDERS, RPH 175 mL/hr at 07/02/24 1045 1,750 mg at 07/02/24 1045    Allergies as of 06/25/2024 - Review Complete 06/25/2024  Allergen Reaction Noted   Shellfish allergy Shortness Of Breath, Rash, and Other (See Comments) 11/03/2011   Celexa  [citalopram ] Other (See Comments)    Levsin [hyoscyamine] Other (See Comments)    Librax [chlordiazepoxide-clidinium] Other (See Comments)    Ak-mycin [erythromycin] Other (See Comments) 04/27/2023   Mobic  [meloxicam ] Other (See Comments) 07/04/2019   Penicillins Itching, Rash,  and Dermatitis 06/09/2011    Family History  Problem Relation Age of Onset   Hypertension Mother    Stroke Mother    Pneumonia Father    Bladder Cancer Father    Cancer Maternal Aunt        ? type   Breast cancer Maternal Aunt        mets   Esophageal cancer Maternal Aunt        aunt ? esophageal ca   Stomach cancer Maternal Aunt        aunt ? stmach ca   Colon cancer Maternal Aunt        had colostomy - not sure of origin   Cancer Maternal Grandmother        ? type   Prostate cancer Neg Hx    Rectal cancer Neg Hx     Social History   Socioeconomic History   Marital status: Divorced    Spouse name: Not on file   Number of children: 1   Years of education: Not on file   Highest education level: Not on file  Occupational History   Occupation: Art Therapist, retired 1997    Employer: [employer]  Tobacco Use   Smoking status: Former    Current packs/day: 0.00    Types: Cigarettes, Cigars    Quit date: 08/22/1998    Years since quitting: 25.8   Smokeless tobacco: Never   Tobacco comments:    cigarettes 1968, cigars 2000  Vaping Use   Vaping status: Never Used  Substance and Sexual Activity   Alcohol use: Not Currently    Alcohol/week: 0.0 standard drinks of alcohol    Comment: rare   Drug use: No   Sexual activity: Never  Other Topics Concern   Not on file  Social History Narrative   Retired from [employer]   From [place]   Prev divorced  1 son   Gerarda '63-'67, no known agent orange exposure      Pt stays at [facility]   Financial Resource Strain: Low Risk  (07/20/2022)   Overall Financial Resource Strain (CARDIA)    Difficulty of Paying Living Expenses: Not hard at all  Food Insecurity: No Food Insecurity (06/25/2024)   Hunger Vital Sign    Worried About Running Out of Food in the Last Year: Never true    Ran Out of Food in the Last Year: Never true  Transportation Needs: No Transportation Needs (06/25/2024)   PRAPARE  - Administrator, Civil Service (Medical): No    Lack of Transportation (Non-Medical): No  Physical Activity: Insufficiently Active (07/20/2022)   Exercise Vital Sign    Days of Exercise per Week: 3 days    Minutes of Exercise per Session: 30 min  Stress: No Stress Concern Present (07/20/2022)   [employer] of Occupational Health - Occupational Stress Questionnaire    Feeling of Stress : Not at all  Social Connections: Moderately Integrated (06/25/2024)   Social Connection and Isolation Panel    Frequency of Communication with Friends and Family: Three times a week    Frequency of Social Gatherings with Friends and Family: Three times a week    Attends Religious Services: 1 to 4 times per year    Active Member of Clubs or Organizations: No    Attends Banker Meetings: 1 to 4 times per year    Marital Status: Divorced  Catering Manager Violence: Not At Risk (06/25/2024)   Humiliation, Afraid, Rape, and Kick questionnaire    Fear of Current or Ex-Partner: No    Emotionally Abused: No    Physically Abused: No    Sexually Abused: No     Review of Systems   Gen: Denies any fever, chills, loss of appetite, change in weight or weight loss CV: Denies chest pain, heart palpitations, syncope, edema  Resp: Denies shortness of breath with rest, cough, wheezing, coughing up blood, and pleurisy. GI:  denies melena, hematochezia, nausea, vomiting, diarrhea, constipation, dysphagia, odyonophagia, early satiety or weight loss.  GU : Denies urinary burning, blood in urine, urinary frequency, and urinary incontinence. MS: Denies joint pain, limitation of movement, swelling, cramps, and atrophy.  Derm: Denies rash, itching, dry skin, hives. Psych: Denies depression, anxiety, memory loss, hallucinations, and confusion. Heme: Denies bruising or bleeding Neuro:  Denies any headaches, dizziness, paresthesias, shaking  Physical Exam   Vital Signs in last 24 hours: Temp:   [97.6 F (36.4 C)-99.1 F (37.3 C)] 97.6 F (36.4 C) (11/11 1433) Pulse Rate:  [60-71] 60 (11/11 1433) Resp:  [15-18] 16 (11/11 1433) BP: (109-137)/(70-81) 137/72 (11/11 1433) SpO2:  [94 %-100 %] 100 % (11/11 1433) Last BM Date : 06/26/24  General:   Alert,  Well-developed, well-nourished, pleasant and cooperative in NAD Head:  Normocephalic and atraumatic. Eyes:  Sclera clear, no icterus.   Conjunctiva pink. Ears:  Normal auditory acuity. Lungs:  Clear throughout to auscultation.   No wheezes, crackles, or rhonchi. No acute distress. Heart:  Regular rate and rhythm; no murmurs, clicks, rubs,  or gallops. Abdomen:  Soft, nontender and nondistended. No masses, hepatosplenomegaly or hernias noted. Normal bowel sounds, without guarding, and without rebound.   Neurologic:  Alert and  oriented x4. Skin:  Intact without significant lesions or rashes. Psych:  Alert and cooperative. Normal mood and affect.   Labs/Studies  Recent Labs Recent Labs    06/30/24 0429 07/02/24 0420  WBC 14.9* 13.9*  HGB 14.5 15.1  HCT 45.7 47.5  PLT 457* 464*   BMET Recent Labs    06/30/24 0429 07/01/24 0419 07/02/24 0420  NA 139  --  139  K 3.7  --  4.0  CL 105  --  105  CO2 27  --  27  GLUCOSE 87  --  86  BUN 13  --  13  CREATININE 0.83 0.69 0.76  CALCIUM 8.1*  --  8.3*    Assessment   Mason [name] is a 78 y.o. year old male A fib, BPH, HTN, portal thrombosis, stroke with residual R sided deficits and aphasia who presented to the hospital this admission with elevated WBC of 42k, fever, found to be in septic shock due to UTI/MRSA bacteremia. Patient had attempted TEE today due to concern for 2D echo with large vegetations, TEE unable to be performed as probe could not be advanced, GI Consulted for possible diagnostic EGD.  Need for diagnostic EGD: -admitted with septic shock secondary to UTI/MRSA bacteremia -2D echo concerning for vegetations -TEE attempted yesterday but unable to  pass probe  -Patient on Eliquis  for history of Afib-requested hold of Eliquis  yesterday in prep for possible EGD on Thursday, Cards/hospitalist in agreement for Heparin  bridging which would need to be stopped 4hrs prior to EGD -required transient cardizem  drip, rate controlled on PO cardizem  currently -patient denies any dysphagia, odynophagia, or current UGI symptoms.  -CT cervical spine in January showed 2 mm C7-T1 grade 1 anterolisthesis, Nonspecific straightening of the expected cervical lordosis, Levocurvature of the cervical spine, Cervical spondylosis  -query if cervical spine abnormalities contributed to inability to pass TEE probve as he denies any UGI symptoms  I discussed recommendations for EGD for further evaluation due to difficulty passing TEE probe yesterday. At this time, patient tells me he does not wish to pursue repeat TEE and is not interested in undergoing EGD due to multiple procedures recently. I have communicated patient's decision to the hospitalist and cardiology team.   Mild diverticulitis: -noted on CT, sigmoid diverticulitis -denies any abdominal pain, rectal bleeding, change in bowel habits -on broad spectrum antibiotics currently   Plan / Recommendations   -patient declining EGD/repeat TEE at this time -continue broad spectrum antibiotics which will cover mild diverticulitis  -Eliquis  on hold/heparin  IV for bridging (needs to be stopped 4 hours prior to any Endo procedures if patient changes his mind)  GI will sign off, please reach out with any additional GI issues or if patient reconsiders pursuing EGD    07/02/2024, 3:35 PM  Nadyne Gariepy L. Makaria Poarch, MSN, APRN, AGNP-C Adult-Gerontology Nurse Practitioner [facility] at [facility]

## 2024-07-02 NOTE — Progress Notes (Signed)
 Daily Progress Note   Patient Name: Mason [name]       Date: 07/02/2024 DOB: 02-09-1946  Age: 78 y.o. MRN#: [id number] Attending Physician: Ricky Fines, MD Primary Care Physician: Mason Arlyss RAMAN, MD Admit Date: 06/25/2024  Reason for Consultation/Follow-up: Establishing goals of care  Patient Profile/HPI:  78 y.o. male  with past medical history of stroke with residual right sided deficits, indwelling Foley, A-fib, BPH, hypertension, portal thrombosis, admitted on 06/25/2024 with sepsis due to MRSA bacteremia.  He briefly required Levophed that is now off.  Palliative medicine consulted for goals of care.   Subjective: Chart reviewed. TEE was not performed yesterday- plan for today. BMET with some hypocalcemia- 8.3- otherwise WNL. CBC with WBC at 13.9 a little improved from 14.9 yesterday.  On evaluation Mason [name] is awake and alert. Reports no complaints. He did not have any questions about our discussion from yesterday.   ROS   Physical Exam Vitals and nursing note reviewed.  Cardiovascular:     Rate and Rhythm: Normal rate.  Pulmonary:     Effort: Pulmonary effort is normal.  Neurological:     Mental Status: He is alert.     Comments: R side hemiparesis             Vital Signs: BP 132/72   Pulse 71   Temp 98 F (36.7 C) (Oral)   Resp 18   Ht 5' 7.5 (1.715 m)   Wt 77.2 kg   SpO2 94%   BMI 26.26 kg/m  SpO2: SpO2: 94 % O2 Device: O2 Device: Room Air O2 Flow Rate: O2 Flow Rate (L/min): 2 L/min  Intake/output summary:  Intake/Output Summary (Last 24 hours) at 07/02/2024 1223 Last data filed at 07/02/2024 9472 Gross per 24 hour  Intake 350 ml  Output 1950 ml  Net -1600 ml   LBM: Last BM Date : 06/26/24 Baseline Weight: Weight: 78 kg Most recent weight:  Weight: 77.2 kg       Palliative Assessment/Data: PPS: 30%      Patient Active Problem List   Diagnosis Date Noted  . Severe sepsis (HCC) 06/25/2024  . UTI (urinary tract infection) 06/25/2024  . PNA (pneumonia) 06/25/2024  . AKI (acute kidney injury) 06/25/2024  . Essential hypertension 05/12/2024  . History of urinary retention 10/30/2023  . History  of elevated PSA 10/30/2023  . Weakness of right side of body 06/05/2023  . History of hemorrhagic stroke with residual hemiplegia (HCC) 05/30/2023  . Glaucoma 05/30/2023  . Former smoker 05/30/2023  . Ambulatory dysfunction 05/30/2023  . 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  . Atrial fibrillation (HCC) 09/29/2019  . 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/18/2019  . Sepsis (HCC) 09/18/2019  . Degenerative disc disease, cervical 01/23/2019  . Skin irritation 10/28/2018  . Low HDL (under 40) 05/24/2016  . BPH (benign prostatic hyperplasia) 12/10/2013  . Erectile dysfunction 08/26/2013  . GERD (gastroesophageal reflux disease) 06/28/2013  . Depression 04/01/2011  . Anxiety state 12/15/2007  . PEPTIC STRICTURE 12/15/2007  . Irritable bowel syndrome 12/23/2006    Palliative Care Assessment & Plan    Assessment/Recommendations/Plan  Continue current interventions- GOC are to stabilize and d/c to LTC with Palliative   Code Status:   Code Status: Limited: Do not attempt resuscitation (DNR) -DNR-LIMITED -Do Not Intubate/DNI    Prognosis:  Unable to determine  Discharge Planning: LTC with Palliative  Care plan was discussed with patient  Thank you for allowing the Palliative Medicine Team to assist in the care of this patient.  Total time:  Prolonged billing:  Time includes:   Preparing to see the patient (e.g., review of tests) Obtaining and/or reviewing separately obtained history Performing a medically necessary appropriate examination and/or evaluation Counseling and educating the patient/family/caregiver Ordering  medications, tests, or procedures Referring and communicating with other health care professionals (when not reported separately) Documenting clinical information in the electronic or other health record Independently interpreting results (not reported separately) and communicating results to the patient/family/caregiver Care coordination (not reported separately) Clinical documentation  Cassondra Stain, AGNP-C Palliative Medicine   Please contact Palliative Medicine Team phone at [phone] for questions and concerns.

## 2024-07-02 NOTE — Plan of Care (Signed)
  Problem: Education: Goal: Knowledge of General Education information will improve Description: Including pain rating scale, medication(s)/side effects and non-pharmacologic comfort measures Outcome: Progressing   Problem: Coping: Goal: Level of anxiety will decrease Outcome: Progressing   Problem: Elimination: Goal: Will not experience complications related to bowel motility Outcome: Progressing   Problem: Pain Managment: Goal: General experience of comfort will improve and/or be controlled Outcome: Progressing   Problem: Safety: Goal: Ability to remain free from injury will improve Outcome: Progressing   Problem: Skin Integrity: Goal: Risk for impaired skin integrity will decrease Outcome: Progressing

## 2024-07-02 NOTE — TOC Progression Note (Signed)
 Transition of Care [facility]) - Progression Note    Patient Details  Name: Mason [name] MRN: [id number] Date of Birth: [birth year]  [facility] CM/SW Contact  Hoy DELENA Bigness, LCSW Phone Number: 07/02/2024, 10:49 AM  Clinical Narrative:    Pt recommended for outpatient palliative care at discharge. Pt agreeable to this plan. VM left with pt's daughter, Berle [phone]) to discuss. Referral made to [facility] for palliative care.    Expected Discharge Plan: Skilled Nursing Facility Barriers to Discharge: Continued Medical Work up               Expected Discharge Plan and Services In-house Referral: Clinical Social Work   Post Acute Care Choice: Resumption of Svcs/PTA Provider Living arrangements for the past 2 months: Skilled Nursing Facility                                       Social Drivers of Health (SDOH) Interventions SDOH Screenings   Food Insecurity: No Food Insecurity (06/25/2024)  Housing: Low Risk  (06/25/2024)  Transportation Needs: No Transportation Needs (06/25/2024)  Utilities: Not At Risk (06/25/2024)  Alcohol Screen: Low Risk  (07/08/2021)  Depression (PHQ2-9): Low Risk  (04/14/2023)  Financial Resource Strain: Low Risk  (07/20/2022)  Physical Activity: Insufficiently Active (07/20/2022)  Social Connections: Moderately Integrated (06/25/2024)  Stress: No Stress Concern Present (07/20/2022)  Tobacco Use: Medium Risk (06/25/2024)    Readmission Risk Interventions    07/02/2024   10:49 AM 06/29/2024   12:51 PM 06/29/2024   12:45 PM  Readmission Risk Prevention Plan  Transportation Screening Complete Complete Complete  PCP or Specialist Appt within 5-7 Days Complete    Home Care Screening Complete Complete Complete  Medication Review (RN CM) Complete Complete Complete

## 2024-07-02 NOTE — H&P (Signed)
 CARDIOLOGY PRE-PROCEDURE HISTORY AND PHYSICAL    Patient ID: Mason [name]; [id number]; 1945/09/24   Admit date: 06/25/2024 Date of Consult: 07/02/2024  Primary Care Provider: Cleatus Arlyss RAMAN, MD Primary Cardiologist:  Primary Electrophysiologist:    History of Present Illness:   Mason [name] is a 78 y/o M is currently admitted to the medicine team for the management of MRSA bacteremia. TTE normal. Here for elective TEE. Stable for the procedure.  Past Medical History:  Diagnosis Date   Actinic keratosis 04/02/2013   L sup scapula - bx proven   Actinic keratosis 04/02/2013   R med ant deltoid - bx proven   Allergy    Anal fissure    Anxiety    Basal cell carcinoma    back - treated in the past    Basal cell carcinoma 05/02/2009   left supratip, Mohs   Cancer (HCC)    basal cell removed x4   Cataract    bil eyes   Depression    Diverticulosis of colon    GERD (gastroesophageal reflux disease)    esophageal narrowing   Glaucoma    Hiatal hernia    IBS (irritable bowel syndrome)    Insomnia    Internal hemorrhoids     Past Surgical History:  Procedure Laterality Date   ANAL SPHINCTEROTOMY  03/1995   fistulotomy   APPENDECTOMY     BASAL CELL CARCINOMA EXCISION     on back   BICEPT TENODESIS Right 09/27/2004   decompression   COLONOSCOPY  04/1999   internal hemmroids,12-11-08 colon tics and hems only    ESOPHAGOGASTRODUODENOSCOPY  04/1999   with esophagitis and stricture   MOHS SURGERY  09/02/2008   L supratip of nose with flap,basal cell cancer [facility]/MOHS   MOUTH SURGERY  01/2020   TONGUE SURGERY  2017   had small place removed, noncancerous   TONSILLECTOMY AND ADENOIDECTOMY     remote   UPPER GASTROINTESTINAL ENDOSCOPY     WRIST SURGERY Right    WRIST SURGERY     scar tissue removed       Inpatient Medications: Scheduled Meds:  [MAR Hold] apixaban   5 mg Oral BID   [MAR Hold] baclofen   5 mg Oral TID   [MAR Hold] Chlorhexidine  Gluconate  Cloth  6 each Topical Daily   [MAR Hold] diltiazem   120 mg Oral Daily   [MAR Hold] latanoprost   1 drop Both Eyes QHS   [MAR Hold] midodrine  5 mg Oral TID WC   [MAR Hold] mupirocin ointment   Nasal BID   [MAR Hold] sertraline  25 mg Oral Daily   [MAR Hold] tamsulosin   0.4 mg Oral QPM   [MAR Hold] timolol   1 drop Both Eyes Daily   Continuous Infusions:  [MAR Hold] vancomycin  1,750 mg (07/02/24 1045)   PRN Meds: [MAR Hold] acetaminophen  **OR** [MAR Hold] acetaminophen , [MAR Hold] guaiFENesin-dextromethorphan, [MAR Hold] ondansetron  **OR** [MAR Hold] ondansetron  (ZOFRAN ) IV, [MAR Hold] mouth rinse, [MAR Hold] polyethylene glycol, [MAR Hold] traZODone   Allergies:    Allergies  Allergen Reactions   Shellfish Allergy Shortness Of Breath, Rash and Other (See Comments)    GI upset Difficulty breathing  Shrimp and crab   Celexa  [Citalopram ] Other (See Comments)    Aggression    Levsin [Hyoscyamine] Other (See Comments)    Urinary retention   Librax [Chlordiazepoxide-Clidinium] Other (See Comments)    Urinary retention   Ak-Mycin [Erythromycin] Other (See Comments)    GI upset  Mobic  [Meloxicam ] Other (See Comments)    GI upset   Penicillins Itching, Rash and Dermatitis    Received Unasyn  earlier without acute reaction    Social History:   Social History   Socioeconomic History   Marital status: Divorced    Spouse name: Not on file   Number of children: 1   Years of education: Not on file   Highest education level: Not on file  Occupational History   Occupation: Art Therapist, retired 1997    Employer: [employer]  Tobacco Use   Smoking status: Former    Current packs/day: 0.00    Types: Cigarettes, Cigars    Quit date: 08/22/1998    Years since quitting: 25.8   Smokeless tobacco: Never   Tobacco comments:    cigarettes 1968, cigars 2000  Vaping Use   Vaping status: Never Used  Substance and Sexual Activity   Alcohol use: Not Currently    Alcohol/week:  0.0 standard drinks of alcohol    Comment: rare   Drug use: No   Sexual activity: Never  Other Topics Concern   Not on file  Social History Narrative   Retired from [employer]   From [place]   Prev divorced    1 son   Gerarda '63-'67, no known agent orange exposure      Pt stays at [facility]   Financial Resource Strain: Low Risk  (07/20/2022)   Overall Financial Resource Strain (CARDIA)    Difficulty of Paying Living Expenses: Not hard at all  Food Insecurity: No Food Insecurity (06/25/2024)   Hunger Vital Sign    Worried About Running Out of Food in the Last Year: Never true    Ran Out of Food in the Last Year: Never true  Transportation Needs: No Transportation Needs (06/25/2024)   PRAPARE - Administrator, Civil Service (Medical): No    Lack of Transportation (Non-Medical): No  Physical Activity: Insufficiently Active (07/20/2022)   Exercise Vital Sign    Days of Exercise per Week: 3 days    Minutes of Exercise per Session: 30 min  Stress: No Stress Concern Present (07/20/2022)   [employer] of Occupational Health - Occupational Stress Questionnaire    Feeling of Stress : Not at all  Social Connections: Moderately Integrated (06/25/2024)   Social Connection and Isolation Panel    Frequency of Communication with Friends and Family: Three times a week    Frequency of Social Gatherings with Friends and Family: Three times a week    Attends Religious Services: 1 to 4 times per year    Active Member of Clubs or Organizations: No    Attends Banker Meetings: 1 to 4 times per year    Marital Status: Divorced  Catering Manager Violence: Not At Risk (06/25/2024)   Humiliation, Afraid, Rape, and Kick questionnaire    Fear of Current or Ex-Partner: No    Emotionally Abused: No    Physically Abused: No    Sexually Abused: No    Family History:    Family History  Problem Relation Age of Onset   Hypertension Mother    Stroke  Mother    Pneumonia Father    Bladder Cancer Father    Cancer Maternal Aunt        ? type   Breast cancer Maternal Aunt        mets   Esophageal cancer Maternal Aunt  aunt ? esophageal ca   Stomach cancer Maternal Aunt        aunt ? stmach ca   Colon cancer Maternal Aunt        had colostomy - not sure of origin   Cancer Maternal Grandmother        ? type   Prostate cancer Neg Hx    Rectal cancer Neg Hx      ROS:  Please see the history of present illness.  ROS  All other ROS reviewed and negative.     Physical Exam/Data:   Vitals:   07/02/24 0444 07/02/24 0836 07/02/24 1310 07/02/24 1354  BP: 129/70 132/72 123/81 136/79  Pulse: 71  67 60  Resp: 18   15  Temp: 98 F (36.7 C)  99.1 F (37.3 C) 98.5 F (36.9 C)  TempSrc: Oral  Oral Oral  SpO2: 94%  95% 97%  Weight:      Height:        Intake/Output Summary (Last 24 hours) at 07/02/2024 1409 Last data filed at 07/02/2024 0527 Gross per 24 hour  Intake 350 ml  Output 1950 ml  Net -1600 ml   Filed Weights   06/25/24 2217 06/26/24 0303 06/28/24 2000  Weight: 74.1 kg 76.4 kg 77.2 kg   Body mass index is 26.26 kg/m.  General:  Well nourished, well developed, in no acute distress HEENT: normal Lymph: no adenopathy Neck: no JVD Endocrine:  No thryomegaly Vascular: No carotid bruits; FA pulses 2+ bilaterally without bruits  Cardiac:  normal S1, S2; RRR; no murmur  Lungs:  clear to auscultation bilaterally, no wheezing, rhonchi or rales  Abd: soft, nontender, no hepatomegaly  Ext: no edema Musculoskeletal:  No deformities, BUE and BLE strength normal and equal Skin: warm and dry  Neuro:  CNs 2-12 intact, no focal abnormalities noted Psych:  Normal affect    Laboratory Data:  Chemistry Recent Labs  Lab 06/27/24 0903 06/28/24 0459 06/30/24 0429 07/01/24 0419 07/02/24 0420  NA 133*  --  139  --  139  K 4.1  --  3.7  --  4.0  CL 102  --  105  --  105  CO2 25  --  27  --  27  GLUCOSE 130*  --   87  --  86  BUN 21  --  13  --  13  CREATININE 0.91   < > 0.83 0.69 0.76  CALCIUM 8.2*  --  8.1*  --  8.3*  GFRNONAA >60   < > >60 >60 >60  ANIONGAP 6  --  7  --  7   < > = values in this interval not displayed.    Recent Labs  Lab 06/25/24 1605  PROT 6.8  ALBUMIN 3.0*  AST 25  ALT 40  ALKPHOS 116  BILITOT 0.7   Hematology Recent Labs  Lab 06/27/24 0903 06/30/24 0429 07/02/24 0420  WBC 17.8* 14.9* 13.9*  RBC 5.19 5.57 5.78  HGB 13.7 14.5 15.1  HCT 42.5 45.7 47.5  MCV 81.9 82.0 82.2  MCH 26.4 26.0 26.1  MCHC 32.2 31.7 31.8  RDW 19.6* 20.0* 20.4*  PLT 416* 457* 464*   Cardiac EnzymesNo results for input(s): TROPONINI in the last 168 hours. No results for input(s): TROPIPOC in the last 168 hours.  BNPNo results for input(s): BNP, PROBNP in the last 168 hours.  DDimer No results for input(s): DDIMER in the last 168 hours.  Radiology/Studies:  No results found.  Assessment and Plan:   MRSA bacteremia  - TTE normal. TEE to r/o infective endocarditis.  After careful review of history and examination, the risks and benefits of transesophageal echocardiogram have been explained including risks of esophageal damage, perforation (1:10,000 risk), bleeding, pharyngeal hematoma as well as other potential complications associated with conscious sedation including aspiration, arrhythmia, respiratory failure and death. Alternatives to treatment were discussed, questions were answered. Patient is willing to proceed.    For questions or updates, please contact [facility] Please consult [email or web address] for contact info under Cardiology/STEMI.   Signed, Kerria Sapien Priya Kymere Fullington, MD 07/02/2024 2:09 PM

## 2024-07-02 NOTE — Care Management Important Message (Signed)
 Important Message  Patient Details  Name: Mason [name] MRN: [id number] Date of Birth: 25-Feb-1946   Important Message Given:  Yes - Medicare IM     Mckinley Adelstein L Yeshaya Vath 07/02/2024, 1:21 PM

## 2024-07-02 NOTE — Progress Notes (Signed)
 ID brief note   Chart reviewed remotely  Afebrile  Labs noted as below     Latest Ref Rng & Units 07/02/2024    4:20 AM 06/30/2024    4:29 AM 06/27/2024    9:03 AM  CBC  WBC 4.0 - 10.5 K/uL 13.9  14.9  17.8   Hemoglobin 13.0 - 17.0 g/dL 84.8  85.4  86.2   Hematocrit 39.0 - 52.0 % 47.5  45.7  42.5   Platelets 150 - 400 K/uL 464  457  416       Latest Ref Rng & Units 07/02/2024    4:20 AM 07/01/2024    4:19 AM 06/30/2024    4:29 AM  CMP  Glucose 70 - 99 mg/dL 86   87   BUN 8 - 23 mg/dL 13   13   Creatinine 9.38 - 1.24 mg/dL 9.23  9.30  9.16   Sodium 135 - 145 mmol/L 139   139   Potassium 3.5 - 5.1 mmol/L 4.0   3.7   Chloride 98 - 111 mmol/L 105   105   CO2 22 - 32 mmol/L 27   27   Calcium 8.9 - 10.3 mg/dL 8.3   8.1    Results for orders placed or performed during the hospital encounter of 06/25/24  Blood Culture (routine x 2)     Status: None   Collection Time: 06/25/24  2:25 PM   Specimen: BLOOD  Result Value Ref Range Status   Specimen Description BLOOD RIGHT ARM  Final   Special Requests   Final    BOTTLES DRAWN AEROBIC AND ANAEROBIC Blood Culture results may not be optimal due to an inadequate volume of blood received in culture bottles   Culture   Final    NO GROWTH 5 DAYS Performed at [facility], [address], [place], [place] [address]    Report Status 06/30/2024 FINAL  Final  Blood Culture (routine x 2)     Status: Abnormal (Preliminary result)   Collection Time: 06/25/24  2:35 PM   Specimen: BLOOD  Result Value Ref Range Status   Specimen Description   Final    BLOOD LEFT ANTECUBITAL Performed at [facility], [name] [place], [place] [address]    Special Requests   Final    BOTTLES DRAWN AEROBIC AND ANAEROBIC Blood Culture results may not be optimal due to an inadequate volume of blood received in culture bottles Performed at [facility], [address] [place], [place] [address]    Culture  Setup Time   Final    BOTTLES DRAWN AEROBIC  ONLY GRAM POSITIVE COCCI Gram Stain Report Called to,Read Back By and Verified With: C. ROGERS RN 06/26/24 @1203  BY J. WHITE CRITICAL RESULT CALLED TO, READ BACK BY AND VERIFIED WITH: RN CSABRA AHLE [phone] @ ELIYA.EDU FH    Culture (A)  Final    METHICILLIN RESISTANT STAPHYLOCOCCUS AUREUS Sent to [facility] for further susceptibility testing. Performed at [facility], [address] [place], [place] [address]    Report Status PENDING  Incomplete   Organism ID, Bacteria METHICILLIN RESISTANT STAPHYLOCOCCUS AUREUS  Final      Susceptibility   Methicillin resistant staphylococcus aureus - MIC*    CIPROFLOXACIN  4 RESISTANT Resistant     ERYTHROMYCIN >=8 RESISTANT Resistant     GENTAMICIN <=0.5 SENSITIVE Sensitive     OXACILLIN >=4 RESISTANT Resistant     TETRACYCLINE <=1 SENSITIVE Sensitive     VANCOMYCIN   1 SENSITIVE Sensitive     TRIMETH /SULFA  <=10 SENSITIVE Sensitive     CLINDAMYCIN <=0.25 SENSITIVE Sensitive     RIFAMPIN <=0.5 SENSITIVE Sensitive     Inducible Clindamycin NEGATIVE Sensitive     LINEZOLID 2 SENSITIVE Sensitive     * METHICILLIN RESISTANT STAPHYLOCOCCUS AUREUS  Blood Culture ID Panel (Reflexed)     Status: Abnormal   Collection Time: 06/25/24  2:35 PM  Result Value Ref Range Status   Enterococcus faecalis NOT DETECTED NOT DETECTED Final   Enterococcus Faecium NOT DETECTED NOT DETECTED Final   Listeria monocytogenes NOT DETECTED NOT DETECTED Final   Staphylococcus species DETECTED (A) NOT DETECTED Final    Comment: CRITICAL RESULT CALLED TO, READ BACK BY AND VERIFIED WITH: RN C. COOK [phone] @ [phone] FH    Staphylococcus aureus (BCID) DETECTED (A) NOT DETECTED Final    Comment: Methicillin (oxacillin)-resistant Staphylococcus aureus (MRSA). MRSA is predictably resistant to beta-lactam antibiotics (except ceftaroline). Preferred therapy is vancomycin  unless clinically contraindicated. Patient requires contact precautions if  hospitalized. CRITICAL RESULT CALLED TO, READ  BACK BY AND VERIFIED WITH: RN C. COOK [phone] @ 1822 FH    Staphylococcus epidermidis NOT DETECTED NOT DETECTED Final   Staphylococcus lugdunensis NOT DETECTED NOT DETECTED Final   Streptococcus species NOT DETECTED NOT DETECTED Final   Streptococcus agalactiae NOT DETECTED NOT DETECTED Final   Streptococcus pneumoniae NOT DETECTED NOT DETECTED Final   Streptococcus pyogenes NOT DETECTED NOT DETECTED Final   A.calcoaceticus-baumannii NOT DETECTED NOT DETECTED Final   Bacteroides fragilis NOT DETECTED NOT DETECTED Final   Enterobacterales NOT DETECTED NOT DETECTED Final   Enterobacter cloacae complex NOT DETECTED NOT DETECTED Final   Escherichia coli NOT DETECTED NOT DETECTED Final   Klebsiella aerogenes NOT DETECTED NOT DETECTED Final   Klebsiella oxytoca NOT DETECTED NOT DETECTED Final   Klebsiella pneumoniae NOT DETECTED NOT DETECTED Final   Proteus species NOT DETECTED NOT DETECTED Final   Salmonella species NOT DETECTED NOT DETECTED Final   Serratia marcescens NOT DETECTED NOT DETECTED Final   Haemophilus influenzae NOT DETECTED NOT DETECTED Final   Neisseria meningitidis NOT DETECTED NOT DETECTED Final   Pseudomonas aeruginosa NOT DETECTED NOT DETECTED Final   Stenotrophomonas maltophilia NOT DETECTED NOT DETECTED Final   Candida albicans NOT DETECTED NOT DETECTED Final   Candida auris NOT DETECTED NOT DETECTED Final   Candida glabrata NOT DETECTED NOT DETECTED Final   Candida krusei NOT DETECTED NOT DETECTED Final   Candida parapsilosis NOT DETECTED NOT DETECTED Final   Candida tropicalis NOT DETECTED NOT DETECTED Final   Cryptococcus neoformans/gattii NOT DETECTED NOT DETECTED Final   Meth resistant mecA/C and MREJ DETECTED (A) NOT DETECTED Final    Comment: CRITICAL RESULT CALLED TO, READ BACK BY AND VERIFIED WITH: RN KYM AHLE [phone] @ [phone] FH Performed at [facility], [address] [name], [place] [address]   MIC (1 Drug)-     Status: Abnormal   Collection Time:  06/25/24  2:35 PM  Result Value Ref Range Status   Min Inhibitory Conc (1 Drug) Final report (A)  Corrected    Comment: (NOTE) Performed At: [facility] [address] [place], [place] [id number] Jennette Shorter MD Ey:[id number] CORRECTED ON 11/11 AT 1436: PREVIOUSLY REPORTED AS Preliminary report    Source [id number] MRSA DAPTOMYCIN BLOOD  Final    Comment: Performed at [facility], [address], [place], [place] [address]  MIC Result     Status: Abnormal  Collection Time: 06/25/24  2:35 PM  Result Value Ref Range Status   Result 1 (MIC) Comment (A)  Final    Comment: (NOTE) Methicillin - resistant Staphylococcus aureus Identification performed by account, not confirmed by this laboratory. Testing performed by gradient elution. DAPTOMYCIN  0.75 ug/mL  SUSCEPTIBLE Performed At: [facility] [address] [place], [place] [id number] Jennette Shorter MD Ey:[id number]   Urine Culture     Status: Abnormal   Collection Time: 06/25/24  4:01 PM   Specimen: Urine, Random  Result Value Ref Range Status   Specimen Description   Final    URINE, RANDOM Performed at [facility], [address] [place], [place] [address]    Special Requests   Final    NONE Reflexed from [id number] Performed at [facility], [address], [place], [place] [address]    Culture MULTIPLE SPECIES PRESENT, SUGGEST RECOLLECTION (A)  Final   Report Status 06/27/2024 FINAL  Final  MRSA Next Gen by PCR, Nasal     Status: Abnormal   Collection Time: 06/26/24  2:41 AM   Specimen: Nasal Mucosa; Nasal Swab  Result Value Ref Range Status   MRSA by PCR Next Gen DETECTED (A) NOT DETECTED Final    Comment: RESULT CALLED TO, READ BACK BY AND VERIFIED WITH: B. SUPER [phone], VIRAY,J (NOTE) The GeneXpert MRSA Assay (FDA approved for NASAL specimens only), is one component of a comprehensive MRSA colonization surveillance program. It is not intended to diagnose MRSA infection nor to guide or  monitor treatment for MRSA infections. Test performance is not FDA approved in patients less than 23 years old. Performed at [facility], [address], [place], [place] [address]   Culture, blood (Routine X 2) w Reflex to ID Panel     Status: None   Collection Time: 06/27/24  8:56 AM   Specimen: BLOOD  Result Value Ref Range Status   Specimen Description BLOOD BLOOD RIGHT HAND  Final   Special Requests   Final    BOTTLES DRAWN AEROBIC AND ANAEROBIC Blood Culture adequate volume   Culture   Final    NO GROWTH 5 DAYS Performed at [facility], [address] [place], [place] [address]    Report Status 07/02/2024 FINAL  Final  Culture, blood (Routine X 2) w Reflex to ID Panel     Status: None   Collection Time: 06/27/24  9:03 AM   Specimen: BLOOD  Result Value Ref Range Status   Specimen Description BLOOD RIGHT ANTECUBITAL  Final   Special Requests   Final    BOTTLES DRAWN AEROBIC AND ANAEROBIC Blood Culture adequate volume   Culture   Final    NO GROWTH 5 DAYS Performed at [facility], [address], [place], [place] [address]    Report Status 07/02/2024 FINAL  Final   Continue Vancomycin , pharmacy to dose Monitor CBC, BMP and Vancomycin  trough  Plan to get TEE after GI evaluation noted   I will not actively follow pending TEE. Please reach out to us  for antibiotic recs once TEE is done.  Annalee Orem, MD Infectious Disease Physician [facility] [address] [place], [place] [address] Phone: [phone]  Fax: [phone]

## 2024-07-02 NOTE — Transfer of Care (Signed)
 Immediate Anesthesia Transfer of Care Note  Patient: [name]  Procedure(s) Performed: ECHOCARDIOGRAM, TRANSESOPHAGEAL  Patient Location: PACU  Anesthesia Type:General  Level of Consciousness: awake, alert , oriented, and patient cooperative  Airway & Oxygen Therapy: Patient Spontanous Breathing and Patient connected to nasal cannula oxygen  Post-op Assessment: Report given to RN, Post -op Vital signs reviewed and stable, and Patient moving all extremities X 4  Post vital signs: Reviewed and stable  Last Vitals:  Vitals Value Taken Time  BP 122/83 1439  Temp    Pulse 61 1439  Resp 20 1439  SpO2 99 1439    Last Pain:  Vitals:   07/02/24 1354  TempSrc: Oral  PainSc: 0-No pain      Patients Stated Pain Goal: 0 (06/28/24 0800)  Complications: No notable events documented.

## 2024-07-02 NOTE — Progress Notes (Signed)
 Physician could not pass TEE scope. Exam canceled.            Aida Pizza, RCS

## 2024-07-02 NOTE — Plan of Care (Signed)
  Problem: Clinical Measurements: Goal: Ability to maintain clinical measurements within normal limits will improve Outcome: Progressing Goal: Cardiovascular complication will be avoided Outcome: Progressing   Problem: Elimination: Goal: Will not experience complications related to urinary retention Outcome: Progressing   Problem: Pain Managment: Goal: General experience of comfort will improve and/or be controlled Outcome: Progressing   Problem: Skin Integrity: Goal: Risk for impaired skin integrity will decrease Outcome: Progressing

## 2024-07-02 NOTE — Progress Notes (Signed)
 Progress Note   Patient: Mason [name] FMW:[id number] DOB: [birth year] DOA: 06/25/2024     7 DOS: the patient was seen and examined on 07/02/2024   Brief hospital admission narrative: Per H&P written by Dr. Pearlean on 06/25/2024 [name] is a 78 y.o. 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 Patient was brought to the ED reports of elevated white blood count of 42,000 and fever.  He also presented with headache, reports intermittent right eye pain, that self resolved, and has been going on for a long time, he saw an eye doctor and was told nothing was wrong with his eye.  He denies eye pain today.  He has a chronic indwelling Foley catheter, it was exchanged earlier today, and had blood.  Patient tells me prior to the exchange he did not have any blood in his urine.  Also reports a cough.   ED Course: Temperature max-99.5.  Heart rate 104-119.  Respiratory rate 13-26.  O2 sats 90 to 97% on room air.  Systolic blood pressure  92 to 132.  Lactic acidosis 3> 5> 2.9> 3.4. WBC 38.4. Creatinine elevated 1.56. UA with positive nitrites and leukocytes, many bacteria. Head CT-no acute abnormality  CT chest abdomen pelvis without contrast- Layering debris in the trachea with mucous plugging and lower lobe bronchial wall thickening, reflecting airway inflammation/infection. Extensive left colon diverticulosis with mild sigmoid diverticulitis. Blood and urine cultures obtained. IV fluid bolus given IV cefepime , and azithromycin started.  Assessment and plan 1-severe sepsis with septic shock in the setting of UTI/MRSA bacteremia - Sepsis present at time of admission with tachycardia, leukocytosis, endorgan dysfunction with acute kidney injury and the presence of lactic acidosis. - Patient blood culture suggesting the presence of gram-positive cocci - Patient with positive MRSA  PCR - Continue IV vancomycin  - 2D echo not demonstrating large vegetations, grade 1 diastolic dysfunction and no wall motion abnormalities. - Appreciate assistance by cardiology service performing TEE. - Afebrile and in no acute distress. - No longer requiring pressors support; overall sepsis features resolved.  - Continue supportive care. - Repeat blood culture without growth for over 72 hours now. - Waiting for TEE results (scheduled for later today 07/02/2024 at 2 PM) and clearance by ID prior to PICC line placement.  2-paroxysmal atrial fibrillation - Require transient use of Cardizem  drip - Rate controlled and currently sinus; will continue Cardizem  and orally; transitioning to extended release form once a day tablet. - Continue Eliquis .  3-concern for possible pneumonia - No productive coughing spells and good saturation on room air -Current antibiotics will cover for any lung infections - Follow recommendations by speech therapy.  4-metabolic encephalopathy - In the setting of acute infection - Head CT negative for acute abnormality - Continue constant orientation and supportive care - On today's evaluation patient mentation back to his baseline.  5-acute kidney injury - In the setting of sepsis and UTI - Continue aggressive fluid resuscitation - Follow renal function trend. - Continue to minimize the use of nephrotoxic agents, avoid contrast and hypotension.  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  7-history of urinary retention/BPH and chronic indwelling Foley catheter - Patient UTI associated with Foley catheter - Foley catheter in place (recently exchanged) - Continue Flomax  - Advised to maintain adequate oral hydration -Continue antibiotics as mentioned above; which will also  cover for urinary infection.  Subjective:  No fever, no chest pain, no  nausea, no vomiting, no palpitations.  Hemodynamically stable and no overnight events reported.  Currently NPO.  Physical Exam: Vitals:   07/01/24 1421 07/01/24 2003 07/02/24 0444 07/02/24 0836  BP: 127/72 109/76 129/70 132/72  Pulse: 71 70 71   Resp: 18 18 18    Temp: 97.6 F (36.4 C) 97.8 F (36.6 C) 98 F (36.7 C)   TempSrc: Oral  Oral   SpO2: 92% 95% 94%   Weight:      Height:       General exam: Alert, awake, oriented x 3;  following commands appropriately and in no acute distress. Respiratory system:   Good saturation on room air.   No using accessory muscles. Cardiovascular system:  Rate controlled, no rubs, no gallops, no JVD. Gastrointestinal system: Abdomen is nondistended, soft and nontender. No organomegaly or masses felt. Normal bowel sounds heard. Central nervous system: No new focal neurological deficits. Extremities: No cyanosis or clubbing. Skin: No petechiae. Psychiatry: Judgement and insight appear normal.  Flat affect appreciated on exam.  Latest Data Reviewed: Lactic acid: 2.3 CBC: WBCs 13.9, hemoglobin 15.1 and platelet count 464K. Basic metabolic panel: Sodium 139, potassium 4.0, chloride 105, bicarb 27, BUN 13, creatinine 0.76 and GFR >60 Magnesium :2.2 Vancomycin  trough: 15  Family Communication: Family and significant other is at bedside. Disposition: Status is: Inpatient Remains inpatient appropriate because: Continue IV antibiotics.  Time:50 minutes.  Author: Eric Nunnery, MD 07/02/2024 11:44 AM  For on call review [email or web address].

## 2024-07-02 NOTE — Anesthesia Postprocedure Evaluation (Signed)
 Anesthesia Post Note  Patient: Mason [name]  Procedure(s) Performed: ECHOCARDIOGRAM, TRANSESOPHAGEAL  Patient location during evaluation: Phase II Anesthesia Type: MAC Level of consciousness: awake Pain management: pain level controlled Vital Signs Assessment: post-procedure vital signs reviewed and stable Respiratory status: spontaneous breathing and respiratory function stable Cardiovascular status: blood pressure returned to baseline and stable Postop Assessment: no headache and no apparent nausea or vomiting Anesthetic complications: no Comments: Late entry   No notable events documented.   Last Vitals:  Vitals:   07/02/24 1354 07/02/24 1433  BP: 136/79 (P) 137/72  Pulse: 60 (P) 60  Resp: 15 (P) 16  Temp: 36.9 C (P) 36.4 C  SpO2: 97% (P) 100%    Last Pain:  Vitals:   07/02/24 1354  TempSrc: Oral  PainSc: 0-No pain                 Yvonna JINNY Bosworth

## 2024-07-02 NOTE — Anesthesia Preprocedure Evaluation (Signed)
 Anesthesia Evaluation  Patient identified by MRN, date of birth, ID band Patient awake    Reviewed: Allergy & Precautions, H&P , NPO status , Patient's Chart, lab work & pertinent test results, reviewed documented beta blocker date and time   Airway Mallampati: II  TM Distance: >3 FB Neck ROM: full    Dental no notable dental hx.    Pulmonary pneumonia, former smoker   Pulmonary exam normal breath sounds clear to auscultation       Cardiovascular Exercise Tolerance: Good hypertension,  Rhythm:regular Rate:Tachycardia     Neuro/Psych  PSYCHIATRIC DISORDERS Anxiety Depression     Neuromuscular disease CVA    GI/Hepatic Neg liver ROS, hiatal hernia,GERD  ,,  Endo/Other  negative endocrine ROS    Renal/GU Renal disease  negative genitourinary   Musculoskeletal   Abdominal   Peds  Hematology negative hematology ROS (+)   Anesthesia Other Findings   Reproductive/Obstetrics negative OB ROS                              Anesthesia Physical Anesthesia Plan  ASA: 4 and emergent  Anesthesia Plan: MAC   Post-op Pain Management:    Induction:   PONV Risk Score and Plan: Propofol infusion  Airway Management Planned:   Additional Equipment:   Intra-op Plan:   Post-operative Plan:   Informed Consent: I have reviewed the patients History and Physical, chart, labs and discussed the procedure including the risks, benefits and alternatives for the proposed anesthesia with the patient or authorized representative who has indicated his/her understanding and acceptance.     Dental Advisory Given  Plan Discussed with: CRNA  Anesthesia Plan Comments:         Anesthesia Quick Evaluation

## 2024-07-02 NOTE — Progress Notes (Signed)
 Mason [name] [address] [facility] Liaison Note:   Notified by IP ALVERNA Mays of patient/family request for [facility] services at [facility] after discharge.   Please call with any hospice or outpatient palliative care related questions.   Thank you for the opportunity to participate in this patient's care.   Eleanor Nail, LPN [facility] Liaison [phone]

## 2024-07-02 NOTE — Progress Notes (Signed)
 Patient TEE has been canceled/aborted after cardiologist unable pass TEE scope.  Gastroenterology service consultation requested as patient will need endoscopy evaluation prior to TEE further attempts.  Will resume diet at the moment.  Eric Nunnery MD [phone]

## 2024-07-03 ENCOUNTER — Encounter (HOSPITAL_COMMUNITY): Payer: Self-pay | Admitting: Internal Medicine

## 2024-07-03 DIAGNOSIS — A4102 Sepsis due to Methicillin resistant Staphylococcus aureus: Secondary | ICD-10-CM | POA: Diagnosis not present

## 2024-07-03 DIAGNOSIS — Z7189 Other specified counseling: Secondary | ICD-10-CM

## 2024-07-03 DIAGNOSIS — N179 Acute kidney failure, unspecified: Secondary | ICD-10-CM | POA: Diagnosis not present

## 2024-07-03 DIAGNOSIS — I480 Paroxysmal atrial fibrillation: Secondary | ICD-10-CM | POA: Diagnosis not present

## 2024-07-03 DIAGNOSIS — R6520 Severe sepsis without septic shock: Secondary | ICD-10-CM | POA: Diagnosis not present

## 2024-07-03 LAB — CBC
HCT: 49.1 % (ref 39.0–52.0)
Hemoglobin: 15.8 g/dL (ref 13.0–17.0)
MCH: 26.6 pg (ref 26.0–34.0)
MCHC: 32.2 g/dL (ref 30.0–36.0)
MCV: 82.5 fL (ref 80.0–100.0)
Platelets: 488 K/uL — ABNORMAL HIGH (ref 150–400)
RBC: 5.95 MIL/uL — ABNORMAL HIGH (ref 4.22–5.81)
RDW: 20.6 % — ABNORMAL HIGH (ref 11.5–15.5)
WBC: 12.5 K/uL — ABNORMAL HIGH (ref 4.0–10.5)
nRBC: 0 % (ref 0.0–0.2)

## 2024-07-03 LAB — CREATININE, SERUM
Creatinine, Ser: 0.69 mg/dL (ref 0.61–1.24)
GFR, Estimated: >60 mL/min (ref >60)

## 2024-07-03 LAB — APTT
aPTT: 61 s — ABNORMAL HIGH (ref 24–36)
aPTT: 79 s — ABNORMAL HIGH (ref 24–36)

## 2024-07-03 LAB — HEPARIN LEVEL (UNFRACTIONATED): Heparin Unfractionated: >1.1 [IU]/mL — ABNORMAL HIGH (ref 0.30–0.70)

## 2024-07-03 LAB — CK: Total CK: <10 U/L — ABNORMAL LOW (ref 49–397)

## 2024-07-03 MED ORDER — DAPTOMYCIN-SODIUM CHLORIDE 700-0.9 MG/100ML-% IV SOLN
700.0000 mg | Freq: Every day | INTRAVENOUS | Status: DC
  Administered 2024-07-03 – 2024-07-05 (×3): 700 mg via INTRAVENOUS
  Filled 2024-07-03 (×5): qty 100

## 2024-07-03 NOTE — Progress Notes (Addendum)
 Daily Progress Note   Patient Name: Mason [name]       Date: 07/03/2024 DOB: 04-10-1946  Age: 78 y.o. MRN#: [id number] Attending Physician: Evonnie Lenis, MD Primary Care Physician: Cleatus Arlyss RAMAN, MD Admit Date: 06/25/2024  Reason for Consultation/Follow-up: Establishing goals of care  Patient Profile/HPI:  78 y.o. male  with past medical history of stroke with residual right sided deficits, indwelling Foley, A-fib, BPH, hypertension, portal thrombosis, admitted on 06/25/2024 with sepsis due to MRSA bacteremia.  He briefly required Levophed that is now off.  Palliative medicine consulted for goals of care.   Subjective: Chart reviewed including labs, progress notes, imaging from this and previous encounters.  TEE was attempted, but scope was unable to be advanced for unknown reason. GI consult was obtained with recommendation for EGD however patient is declining further procedures.  Discussed with bedside RN- patient is declining to take po medications and has told her he is tired.  I met at bedside with patient. He shared his frustration with being poked and prodded. He is tired of being woken at wee hours of them morning to have his blood drawn. He continues to feel his quality of life is poor given his paralysis and loss of independence and control.  He is adamant that he does not want EGD or any further procedures.  We discussed reason for need of EGD which patient understands.  We discussed his feelings about ongoing measures that are aimed at prolonging his life- the need for placement of PICC, IV antibiotics and taking other medications as well as what interventions he would want if his health declined further.  I reviewed option of comfort measures that we discussed last week.  Discussed transition to comfort measures only which includes stopping IV fluids, antibiotics, labs and providing symptom management for SOB, anxiety, nausea, vomiting, and other symptoms of dying. Noted he could have help from hospice.  Patient shared that he is considering comfort path and has spoken with his girlfriend and his son about this. Both are supportive of whatever path he chooses for himself.  Emotional support provided as patient shared his worries about his family missing him.  Chaplain Maude was also present and provided support.  At close of discussion decision was made to hold on EGD procedure. Patient agreed to take po medications.  He would like to continue current care for 24 hours while he considers possible transition to comfort measures only and hospice. This was discussed with attending MD Dr. Evonnie.  I attempted to call both patient's daughter and son- messages left requesting a return call.  ROS   Physical Exam Vitals and nursing note reviewed.  Cardiovascular:     Rate and Rhythm: Normal rate.  Pulmonary:     Effort: Pulmonary effort is normal.  Neurological:     Mental Status: He is alert and oriented to person, place, and time.     Comments: R side hemiparesis             Vital Signs: BP (!) 145/84 (BP Location: Left Arm)   Pulse 62   Temp (!) 97.5 F (36.4 C) (Oral)   Resp 17   Ht 5' 7.5 (1.715 m)   Wt 77.2 kg   SpO2 93%   BMI 26.26 kg/m  SpO2: SpO2: 93 % O2 Device: O2 Device: Room Air O2 Flow Rate: O2 Flow Rate (L/min): 2 L/min  Intake/output summary:  Intake/Output Summary (Last 24 hours) at 07/03/2024 1404 Last data filed at 07/03/2024 0505 Gross per 24 hour  Intake 485.29 ml  Output 1350 ml  Net -864.71 ml   LBM: Last BM Date : 06/26/24 Baseline Weight: Weight: 78 kg Most recent weight: Weight: 77.2 kg       Palliative Assessment/Data: PPS: 30%      Patient Active Problem List   Diagnosis Date Noted   Severe sepsis (HCC)  06/25/2024   UTI (urinary tract infection) 06/25/2024   PNA (pneumonia) 06/25/2024   AKI (acute kidney injury) 06/25/2024   Essential hypertension 05/12/2024   History of urinary retention 10/30/2023   History of elevated PSA 10/30/2023   Weakness of right side of body 06/05/2023   History of hemorrhagic stroke with residual hemiplegia (HCC) 05/30/2023   Glaucoma 05/30/2023   Former smoker 05/30/2023   Ambulatory dysfunction 05/30/2023   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   Atrial fibrillation (HCC) 09/29/2019   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/18/2019   Sepsis (HCC) 09/18/2019   Degenerative disc disease, cervical 01/23/2019   Skin irritation 10/28/2018   Low HDL (under 40) 05/24/2016   BPH (benign prostatic hyperplasia) 12/10/2013   Erectile dysfunction 08/26/2013   GERD (gastroesophageal reflux disease) 06/28/2013   Depression 04/01/2011   Anxiety state 12/15/2007   PEPTIC STRICTURE 12/15/2007   Irritable bowel syndrome 12/23/2006    Palliative Care Assessment & Plan    Assessment/Recommendations/Plan  Continue current interventions, no EGD, patient considering transition to comfort- PMT will revisit tomorrow  Addendum- I received return call from patient's son. I reviewed above conversation with him. He is going to call his Dad for further discussion.  Code Status:   Code Status: Limited: Do not attempt resuscitation (DNR) -DNR-LIMITED -Do Not Intubate/DNI    Prognosis:  Unable to determine  Discharge Planning: To Be Determined  Care plan was discussed with patient  Thank you for allowing the Palliative Medicine Team to assist in the care of this patient.  Total time:  Prolonged billing:  Time includes:   Preparing to see the patient (e.g., review of tests) Obtaining and/or reviewing separately obtained history Performing a medically necessary appropriate examination and/or evaluation Counseling and educating the patient/family/caregiver Ordering medications,  tests, or procedures Referring and communicating with other health care professionals (when not reported separately) Documenting clinical information in the electronic or other health record Independently interpreting results (not reported separately)  and communicating results to the patient/family/caregiver Care coordination (not reported separately) Clinical documentation  Cassondra Stain, AGNP-C Palliative Medicine   Please contact Palliative Medicine Team phone at [phone] for questions and concerns.

## 2024-07-03 NOTE — Progress Notes (Signed)
 Physical Therapy Treatment Patient Details Name: Mason [name] MRN: [id number] DOB: Sep 23, 1945 Today's Date: 07/03/2024   History of Present Illness Mason [name] is a 78 y.o. 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  Patient was brought to the ED reports of elevated white blood count of 42,000 and fever.  He also presented with headache, reports intermittent right eye pain, that self resolved, and has been going on for a long time, he saw an eye doctor and was told nothing was wrong with his eye.  He denies eye pain today.  He has a chronic indwelling Foley catheter, it was exchanged earlier today, and had blood.  Patient tells me prior to the exchange he did not have any blood in his urine.  Also reports a cough.    PT Comments  Patient agreeable for therapy. Patient demonstrates slow labored movement for sitting up at bedside with c/o severe pain RLE with movement, pressure, once seated had frequent posterior leaning requiring tactile assistance for avoid falling backwards, after given instructions to hold onto knee with his left hand, then able to keep trunk in midline while completing LLE exercises.  Patient very weak during sit to stand and required Max assist to transfer to chair. Patient tolerated sitting up in chair after therapy - nursing staff notified. Patient will benefit from continued skilled physical therapy in hospital and recommended venue below to increase strength, balance, endurance for safe ADLs and gait.       If plan is discharge home, recommend the following: A lot of help with bathing/dressing/bathroom;A lot of help with walking and/or transfers;Help with stairs or ramp for entrance;Assist for transportation;Assistance with cooking/housework   Can travel by private vehicle     No  Equipment Recommendations  None recommended by PT    Recommendations  for Other Services       Precautions / Restrictions Precautions Precautions: Fall Recall of Precautions/Restrictions: Intact Restrictions Weight Bearing Restrictions Per Provider Order: No     Mobility  Bed Mobility Overal bed mobility: Needs Assistance Bed Mobility: Rolling, Sidelying to Sit Rolling: Mod assist Sidelying to sit: Max assist       General bed mobility comments: slow labored movement with c/o severe pain in RLE    Transfers Overall transfer level: Needs assistance Equipment used: 1 person hand held assist Transfers: Sit to/from Stand, Bed to chair/wheelchair/BSC Sit to Stand: Max assist Stand pivot transfers: Max assist         General transfer comment: unsteady labored movement    Ambulation/Gait                   Stairs             Wheelchair Mobility     Tilt Bed    Modified Rankin (Stroke Patients Only)       Balance Overall balance assessment: Needs assistance Sitting-balance support: Feet supported, Single extremity supported Sitting balance-Leahy Scale: Poor Sitting balance - Comments: fair/poor seated at EOB Postural control: Posterior lean Standing balance support: During functional activity, No upper extremity supported Standing balance-Leahy Scale: Poor Standing balance comment: hand held assist                            Communication Communication Communication: No apparent difficulties  Cognition Arousal: Alert Behavior During Therapy: [facility] for tasks assessed/performed  Following commands: Intact      Cueing Cueing Techniques: Verbal cues, Tactile cues  Exercises General Exercises - Lower Extremity Long Arc Quad: Seated, AROM, Strengthening, Left, 10 reps Hip Flexion/Marching: Seated, AROM, Strengthening, Left, 10 reps Toe Raises: Seated, AROM, Strengthening, Left, 10 reps Heel Raises: Seated, AROM, Strengthening, Left, 10 reps    General  Comments        Pertinent Vitals/Pain Pain Assessment Pain Assessment: Faces Faces Pain Scale: Hurts little more Pain Location: RUE with movement Pain Descriptors / Indicators: Sore, Grimacing, Discomfort, Guarding Pain Intervention(s): Limited activity within patient's tolerance, Monitored during session, Repositioned    Home Living                          Prior Function            PT Goals (current goals can now be found in the care plan section) Acute Rehab PT Goals Patient Stated Goal: return home PT Goal Formulation: With patient Time For Goal Achievement: 07/13/24 Potential to Achieve Goals: Good Progress towards PT goals: Progressing toward goals    Frequency    Min 2X/week      PT Plan      Co-evaluation              AM-PAC PT 6 Clicks Mobility   Outcome Measure  Help needed turning from your back to your side while in a flat bed without using bedrails?: A Lot Help needed moving from lying on your back to sitting on the side of a flat bed without using bedrails?: A Lot Help needed moving to and from a bed to a chair (including a wheelchair)?: A Lot Help needed standing up from a chair using your arms (e.g., wheelchair or bedside chair)?: A Lot Help needed to walk in hospital room?: Total Help needed climbing 3-5 steps with a railing? : Total 6 Click Score: 10    End of Session   Activity Tolerance: Patient tolerated treatment well;Patient limited by fatigue Patient left: in chair;with call bell/phone within reach Nurse Communication: Mobility status PT Visit Diagnosis: Unsteadiness on feet (R26.81);Other abnormalities of gait and mobility (R26.89);Muscle weakness (generalized) (M62.81)     Time: 8949-8884 PT Time Calculation (min) (ACUTE ONLY): 25 min  Charges:    $Therapeutic Exercise: 8-22 mins $Therapeutic Activity: 8-22 mins PT General Charges $$ ACUTE PT VISIT: 1 Visit                     1:49 PM, 07/03/24 Lynwood Music, MPT Physical Therapist with [facility] [phone] office [phone] mobile phone

## 2024-07-03 NOTE — Plan of Care (Signed)
   Problem: Education: Goal: Knowledge of General Education information will improve Description: Including pain rating scale, medication(s)/side effects and non-pharmacologic comfort measures Outcome: Progressing   Problem: Activity: Goal: Risk for activity intolerance will decrease Outcome: Progressing   Problem: Nutrition: Goal: Adequate nutrition will be maintained Outcome: Progressing

## 2024-07-03 NOTE — Hospital Course (Signed)
 Per H&P written by Dr. Pearlean on 06/25/2024 Mason [name] is a 78 y.o. 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 Patient was brought to the ED reports of elevated white blood count of 42,000 and fever.  He also presented with headache, reports intermittent right eye pain, that self resolved, and has been going on for a long time, he saw an eye doctor and was told nothing was wrong with his eye.  He denies eye pain today.  He has a chronic indwelling Foley catheter, it was exchanged earlier today, and had blood.  Patient tells me prior to the exchange he did not have any blood in his urine.  Also reports a cough.   ED Course: Temperature max-99.5.  Heart rate 104-119.  Respiratory rate 13-26.  O2 sats 90 to 97% on room air.  Systolic blood pressure  92 to 132.  Lactic acidosis 3> 5> 2.9> 3.4. WBC 38.4. Creatinine elevated 1.56. UA with positive nitrites and leukocytes, many bacteria. Head CT-no acute abnormality  CT chest abdomen pelvis without contrast- Layering debris in the trachea with mucous plugging and lower lobe bronchial wall thickening, reflecting airway inflammation/infection. Extensive left colon diverticulosis with mild sigmoid diverticulitis. Blood and urine cultures obtained. IV fluid bolus given IV cefepime , and azithromycin started.  When 06/25/24 blood cultures returned MRSA, cefepime  and azithro were discontinued and he was started on IV vancomycin .  He gradually improved clinically.  Repeat blood cultures remained neg x 5 days.  ID was consulted and recommended TEE.  TEE was not successful due to unable to pass probe down esophagus.  GI was consulted for possible EGD.  Ultimately, patient decided he did not want any further procedures.  Palliative medicine was consulted and continued to follow the patient regarding GOC as pt intermittently refused meds. After  multiple meetings and discussion with the patient regarding goals of care, the patient ultimately agreed to not pursue any further procedures.  Therefore EGD and TEE were canceled.  Infectious disease was notified.  Therefore, the patient would need a longer course of antibiotics to treat for possible endocarditis.  A PICC line was placed with which the patient agreed.  He was discharged to his nursing facility with daptomycin until 08/08/2024.

## 2024-07-03 NOTE — Progress Notes (Signed)
 PHARMACY - ANTICOAGULATION CONSULT NOTE  Pharmacy Consult for heparin  Indication: atrial fibrillation  Labs: Recent Labs    07/01/24 0419 07/02/24 0420 07/03/24 0358  HGB  --  15.1 15.8  HCT  --  47.5 49.1  PLT  --  464* 488*  APTT  --   --  61*  HEPARINUNFRC  --   --  >1.10*  CREATININE 0.69 0.76 0.69   Assessment: 77yo male subtherapeutic on heparin  with initial dosing while DOAC on hold; no infusion issues or signs of bleeding per RN.  Goal of Therapy:  aPTT 66-102 seconds   Plan:  Increase heparin  infusion slightly to 1200 units/hr. Check PTT in 6 hours.   Marvetta Dauphin, PharmD, BCPS 07/03/2024 6:32 AM

## 2024-07-03 NOTE — Progress Notes (Signed)
 SPIRITUAL CARE AND COUNSELING CONSULT NOTE   VISIT SUMMARY  Reason for Visit: Chaplain responding to referral from RN on the floor suggesting Pt was wrestling with palliative and comfort care decisions and could use an extra layer of support. Pt expressed a desire to talk with a Chaplain  Description of Visit: Upon entering the room I found Mason [name] in the bed and already in conversation with the Palliative Care representative. I introduced myself and asked both parties if I could join their conversation.   In discussion with palliative NP Pt stated his desire to be done with all the exploratory procedures, the pokes and sticks and middle of the night pills. He expressed feeling out of control of his own journey and wanted to assert his own control over his health, and he expressed tentative desire to transition to comfort-focused care.  The palliative NP asked him to think about it for 24 hours and she will check in with him tomorrow and move forward if still agreeable.   When palliative left I remained to talk with Pt and process his beliefs about God and heaven, and forgiveness.  Pt is secure in his belief.   Plan of Care: I will plan to follow up with this Pt tomorrow and re-assess the need for spiritual care interventions from there.   SPIRITUAL ENCOUNTER                                                                                                                                                                      Type of Visit: Initial Care provided to:: Patient Conversation partners present during encounter:  (Palliative Freight Forwarder) Referral source: Nurse (RN/NT/LPN) Reason for visit: Urgent spiritual support OnCall Visit: No   SPIRITUAL FRAMEWORK  Presenting Themes: Caregiving needs, Goals in life/care, Values and beliefs, Significant life change Values/beliefs: Pt expresses faith in God and is trusting for help and salvation; Pt also desires control in his life again-  he values his personal agency Community/Connection: Family Strengths: Gratitiude; Cytogeneticist; humility Needs/Challenges/Barriers: Wants to live free of pokes and invasive procedures and the stress of constant medical decisions to be made. Patient Stress Factors: Exhausted   GOALS   Self/Personal Goals: Wants to live free of pokes and invasive procedures and the stress of constant medical decisions to be made. Clinical Care Goals: Support patient's dignity and personal decision making   INTERVENTIONS   Spiritual Care Interventions Made: Compassionate presence, Reflective listening, Explored ethical dilemma, Decision-making support/facilitation, Prayer    INTERVENTION OUTCOMES   Outcomes: Connection to spiritual care, Awareness of support, Reduced anxiety  SPIRITUAL CARE PLAN   Spiritual Care Issues Still Outstanding: Chaplain will continue to follow    If immediate needs arise, please consult Zelda Salmon AMION schedule  for chaplain coverage  Maude Roll, MDiv Chaplain, [facility] [email or web address] [phone]  07/03/2024 2:40 PM

## 2024-07-03 NOTE — Progress Notes (Signed)
 Pharmacy Antibiotic Note  Mason [name] is a 78 y.o. male admitted on 06/25/2024 with MRSA bacteremia.  Pharmacy has been consulted to transition Vancomycin  to Daptomycin dosing.  Will add on CK to AM labs today, CrCl>30 ml/min.  Plan: - D/c Vancomycin  - Start Daptomycin 700 mg IV every 24 hours - CK check today then weekly - Will f/u on continued work-up and LOT plans  Height: 5' 7.5 (171.5 cm) Weight: 77.2 kg (170 lb 3.1 oz) IBW/kg (Calculated) : 67.25  Temp (24hrs), Avg:98.2 F (36.8 C), Min:97.5 F (36.4 C), Max:99.1 F (37.3 C)  Recent Labs  Lab 06/27/24 0903 06/28/24 0459 06/30/24 0429 06/30/24 2059 07/01/24 0419 07/02/24 0420 07/03/24 0358  WBC 17.8*  --  14.9*  --   --  13.9* 12.5*  CREATININE 0.91 0.83 0.83  --  0.69 0.76 0.69  VANCOTROUGH  --   --   --   --  15  --   --   VANCOPEAK  --   --   --  23*  --   --   --     Estimated Creatinine Clearance: 71.3 mL/min (by C-G formula based on SCr of 0.69 mg/dL).    Allergies  Allergen Reactions   Shellfish Allergy Shortness Of Breath, Rash and Other (See Comments)    GI upset Difficulty breathing  Shrimp and crab   Celexa  [Citalopram ] Other (See Comments)    Aggression    Levsin [Hyoscyamine] Other (See Comments)    Urinary retention   Librax [Chlordiazepoxide-Clidinium] Other (See Comments)    Urinary retention   Ak-Mycin [Erythromycin] Other (See Comments)    GI upset   Mobic  [Meloxicam ] Other (See Comments)    GI upset   Penicillins Itching, Rash and Dermatitis    Received Unasyn  earlier without acute reaction    Antimicrobials this admission: Vanco 11/6 >> 11/11 Cefepime  11/4 >> 11/6 Azith 11/4 >> 11/5 Flagyl  11/4 >> 11/6 Daptomycin 11/12 >>   Microbiology results: 11/6 Bcx: NGf 11/4 Bcx: MRSA MRSA PCR: +  Thank you for allowing pharmacy to be a part of this patient's care.  Almarie Lunger, PharmD, BCPS, BCIDP Infectious Diseases Clinical Pharmacist 07/03/2024 9:59 AM    **Pharmacist phone directory can now be found on [email or web address]).  Listed under [facility].

## 2024-07-03 NOTE — Progress Notes (Signed)
 PHARMACY - ANTICOAGULATION CONSULT NOTE  Pharmacy Consult for heparin  Indication: atrial fibrillation  Allergies  Allergen Reactions   Shellfish Allergy Shortness Of Breath, Rash and Other (See Comments)    GI upset Difficulty breathing  Shrimp and crab   Celexa  [Citalopram ] Other (See Comments)    Aggression    Levsin [Hyoscyamine] Other (See Comments)    Urinary retention   Librax [Chlordiazepoxide-Clidinium] Other (See Comments)    Urinary retention   Ak-Mycin [Erythromycin] Other (See Comments)    GI upset   Mobic  [Meloxicam ] Other (See Comments)    GI upset   Penicillins Itching, Rash and Dermatitis    Received Unasyn  earlier without acute reaction    Patient Measurements: Height: 5' 7.5 (171.5 cm) Weight: 77.2 kg (170 lb 3.1 oz) IBW/kg (Calculated) : 67.25 HEPARIN  DW (KG): 76.4  Vital Signs: Temp: 97.9 F (36.6 C) (11/12 1423) Temp Source: Oral (11/12 1423) BP: 110/89 (11/12 1423) Pulse Rate: 96 (11/12 1423)  Labs: Recent Labs    07/01/24 0419 07/02/24 0420 07/03/24 0358 07/03/24 1309  HGB  --  15.1 15.8  --   HCT  --  47.5 49.1  --   PLT  --  464* 488*  --   APTT  --   --  61* 79*  HEPARINUNFRC  --   --  >1.10*  --   CREATININE 0.69 0.76 0.69  --   CKTOTAL  --   --  <10*  --     Estimated Creatinine Clearance: 71.3 mL/min (by C-G formula based on SCr of 0.69 mg/dL).   Medical History: Past Medical History:  Diagnosis Date   Actinic keratosis 04/02/2013   L sup scapula - bx proven   Actinic keratosis 04/02/2013   R med ant deltoid - bx proven   Allergy    Anal fissure    Anxiety    Basal cell carcinoma    back - treated in the past    Basal cell carcinoma 05/02/2009   left supratip, Mohs   Cancer (HCC)    basal cell removed x4   Cataract    bil eyes   Depression    Diverticulosis of colon    GERD (gastroesophageal reflux disease)    esophageal narrowing   Glaucoma    Hiatal hernia    IBS (irritable bowel syndrome)    Insomnia     Internal hemorrhoids     Medications:  Medications Prior to Admission  Medication Sig Dispense Refill Last Dose/Taking   Baclofen  5 MG TABS Take 1 tablet by mouth 3 (three) times daily.   06/24/2024 Evening   cetirizine (ZYRTEC) 10 MG tablet Take 10 mg by mouth daily.   06/24/2024 Morning   ELIQUIS  2.5 MG TABS tablet Take 2.5 mg by mouth 2 (two) times daily. 0800, 2000   06/24/2024 at  8:00 PM   [EXPIRED] guaiFENesin (MUCINEX) 600 MG 12 hr tablet Take 600 mg by mouth 2 (two) times daily. For 5 days   06/24/2024 Evening   lactose free nutrition (BOOST) LIQD Take 240 mLs by mouth 3 (three) times daily between meals.   06/24/2024 Evening   latanoprost  (XALATAN ) 0.005 % ophthalmic solution Place 1 drop into both eyes at bedtime.   06/24/2024 Bedtime   melatonin 3 MG TABS tablet Take 1 tablet (3 mg total) by mouth at bedtime as needed. (Patient taking differently: Take 3 mg by mouth at bedtime as needed (insomnia).)   Unknown   methocarbamol  (ROBAXIN ) 500 MG tablet Take  1 tablet (500 mg total) by mouth every 8 (eight) hours as needed for muscle spasms. 90 tablet 0 Unknown   nystatin  ointment (MYCOSTATIN ) Apply 1 Application topically every 12 (twelve) hours as needed (chronic fungal rash). Apply to glans penis/scrotum   Unknown   polyethylene glycol (MIRALAX  / GLYCOLAX ) 17 g packet Take 17 g by mouth 2 (two) times daily.   06/24/2024 Evening   pregabalin  (LYRICA ) 50 MG capsule Take 50 mg by mouth 2 (two) times daily.   06/24/2024 Evening   PROCTOZONE -HC 2.5 % rectal cream Place 1 Application rectally every 6 (six) hours as needed for hemorrhoids (after bowel movements).   Unknown   senna-docusate (SENOKOT-S) 8.6-50 MG tablet Take 1 tablet by mouth daily.   06/24/2024 Morning   sertraline (ZOLOFT) 25 MG tablet Take 25 mg by mouth daily.   06/24/2024 Morning   tamsulosin  (FLOMAX ) 0.4 MG CAPS capsule Take 1 capsule (0.4 mg total) by mouth daily after supper. (Patient taking differently: Take 0.4 mg by mouth  every evening.)   06/24/2024 Evening   timolol  (TIMOPTIC ) 0.25 % ophthalmic solution Place 1 drop into both eyes daily.   06/24/2024 Morning    Assessment: Pharmacy consulted to dose heparin  in patient with atrial fibrillation.  He is on Eliquis  with last dose 11/11 @ 0835.  Patient is now needing endoscopy so holding Eliquis  and starting heparin  infusion. Will need to dose based on aPTT until correlation with heparin  levels. aPTT level therapeutic at 79.  Goal of Therapy:  Heparin  level 0.3-0.7 units/ml aPTT 66-102 seconds Monitor platelets by anticoagulation protocol: Yes   Plan:  Continue heparin  infusion at 1200 units/hr Check aPTT level daily while on heparin  Continue to monitor H&H and platelets  Elspeth Sour, PharmD Clinical Pharmacist 07/03/2024 2:32 PM

## 2024-07-03 NOTE — Progress Notes (Addendum)
 PROGRESS NOTE  Mason [name] FMW:[id number] DOB: 17-Nov-1945 DOA: 06/25/2024 PCP: Cleatus Arlyss RAMAN, MD  Brief History:  Per H&P written by Dr. Pearlean on 06/25/2024 Mason [name] is a 78 y.o. 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 Patient was brought to the ED reports of elevated white blood count of 42,000 and fever.  He also presented with headache, reports intermittent right eye pain, that self resolved, and has been going on for a long time, he saw an eye doctor and was told nothing was wrong with his eye.  He denies eye pain today.  He has a chronic indwelling Foley catheter, it was exchanged earlier today, and had blood.  Patient tells me prior to the exchange he did not have any blood in his urine.  Also reports a cough.   ED Course: Temperature max-99.5.  Heart rate 104-119.  Respiratory rate 13-26.  O2 sats 90 to 97% on room air.  Systolic blood pressure  92 to 132.  Lactic acidosis 3> 5> 2.9> 3.4. WBC 38.4. Creatinine elevated 1.56. UA with positive nitrites and leukocytes, many bacteria. Head CT-no acute abnormality  CT chest abdomen pelvis without contrast- Layering debris in the trachea with mucous plugging and lower lobe bronchial wall thickening, reflecting airway inflammation/infection. Extensive left colon diverticulosis with mild sigmoid diverticulitis. Blood and urine cultures obtained. IV fluid bolus given IV cefepime , and azithromycin started.  When 06/25/24 blood cultures returned MRSA, cefepime  and azithro were discontinued and he was started on IV vancomycin .  He gradually improved clinically.  Repeat blood cultures remained neg x 5 days.  ID was consulted and recommended TEE.  TEE was not successful due to unable to pass probe down esophagus.  GI was consulted for possible EGD.  Ultimately, patient decided he did not want any further  procedures.  Palliative medicine was consulted and continued to follow the patient regarding GOC as pt intermittently refused meds.   Assessment/Plan:  severe sepsis with septic shock/MRSA bacteremia - due to MRSA bacteremia--source unclear - Sepsis present at time of admission with tachycardia, leukocytosis, endorgan dysfunction with acute kidney injury and the presence of lactic acidosis. - 06/25/24 blood culture = MRSA - Patient with positive MRSA PCR - started IV vancomycin >>daptomycin 07/03/24 - 2D echo not demonstrating large vegetations, grade 1 diastolic dysfunction and no wall motion abnormalities. - Appreciate assistance by cardiology service performing TEE. - Afebrile and in no acute distress. - No longer requiring pressors support; overall sepsis features resolved.  - Repeat blood culture--NGTD - Waiting for TEE results scheduled 07/02/2024--unable to pass probe>>GI consulted   paroxysmal atrial fibrillation - Require transient use of Cardizem  drip>>po diltiazem  - Rate controlled and currently sinus; will continue Cardizem  and orally; transitioning to extended release form once a day tablet. 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   urinary retention/BPH and chronic indwelling  Foley catheter - Patient UTI associated with Foley catheter - Foley catheter in place (recently exchanged) - Continue Flomax           Family  Communication:   no Family at bedside  Consultants:  ID, palliative  Code Status:  DNR  DVT Prophylaxis:  IV Heparin     Procedures: As Listed in Progress Note Above  Antibiotics: Vanco 11/6>>11/11 Dapto 11/12>>      Subjective: Patient denies fevers, chills, headache, chest pain, dyspnea, nausea, vomiting, diarrhea, abdominal pain, dysuria, hematuria, hematochezia, and melena.   Objective: Vitals:   07/02/24 1530 07/02/24 2004 07/03/24 0430 07/03/24 1423  BP: (!) 142/70 133/77 (!) 145/84 110/89  Pulse:  69 62 96  Resp: 18 17 17 18   Temp:  98.1 F (36.7 C) (!) 97.5 F (36.4 C) 97.9 F (36.6 C)  TempSrc:  Oral Oral Oral  SpO2: 100% 94% 93% 100%  Weight:      Height:        Intake/Output Summary (Last 24 hours) at 07/03/2024 1726 Last data filed at 07/03/2024 0505 Gross per 24 hour  Intake 385.29 ml  Output 1350 ml  Net -964.71 ml   Weight change:  Exam:  General:  Pt is alert, follows commands appropriately, not in acute distress HEENT: No icterus, No thrush, No neck mass, [place]/AT Cardiovascular: RRR, S1/S2, no rubs, no gallops Respiratory: bibasilar crackles. No wheeze Abdomen: Soft/+BS, non tender, non distended, no guarding Extremities: No edema, No lymphangitis, No petechiae, No rashes, no synovitis   Data Reviewed: I have personally reviewed following labs and imaging studies Basic Metabolic Panel: Recent Labs  Lab 06/27/24 0903 06/28/24 0459 06/30/24 0429 07/01/24 0419 07/02/24 0420 07/03/24 0358  NA 133*  --  139  --  139  --   K 4.1  --  3.7  --  4.0  --   CL 102  --  105  --  105  --   CO2 25  --  27  --  27  --   GLUCOSE 130*  --  87  --  86  --   BUN 21  --  13  --  13  --   CREATININE 0.91 0.83 0.83 0.69 0.76 0.69  CALCIUM 8.2*  --  8.1*  --  8.3*  --   MG  --   --   --   --  2.2  --    Liver Function Tests: No results for input(s): AST, ALT, ALKPHOS, BILITOT, PROT, ALBUMIN in the last 168 hours. No results for  input(s): LIPASE, AMYLASE in the last 168 hours. No results for input(s): AMMONIA in the last 168 hours. Coagulation Profile: No results for input(s): INR, PROTIME in the last 168 hours. CBC: Recent Labs  Lab 06/27/24 0903 06/30/24 0429 07/02/24 0420 07/03/24 0358  WBC 17.8* 14.9* 13.9* 12.5*  NEUTROABS 15.4*  --   --   --   HGB 13.7 14.5 15.1 15.8  HCT 42.5 45.7 47.5 49.1  MCV 81.9 82.0 82.2 82.5  PLT 416* 457* 464* 488*   Cardiac Enzymes: Recent Labs  Lab 07/03/24 0358  CKTOTAL <10*   BNP: Invalid input(s): POCBNP CBG: Recent Labs  Lab 06/30/24 0742  GLUCAP 110*   HbA1C: No results for input(s): HGBA1C in the last 72 hours. Urine analysis:    Component Value Date/Time   COLORURINE YELLOW 06/25/2024 1601   APPEARANCEUR CLOUDY (A) 06/25/2024 1601   LABSPEC 1.020 06/25/2024 1601   PHURINE 6.0 06/25/2024 1601  GLUCOSEU NEGATIVE 06/25/2024 1601   GLUCOSEU NEGATIVE 10/07/2022 0847   HGBUR LARGE (A) 06/25/2024 1601   HGBUR negative 01/01/2009 0952   BILIRUBINUR NEGATIVE 06/25/2024 1601   KETONESUR NEGATIVE 06/25/2024 1601   PROTEINUR 100 (A) 06/25/2024 1601   UROBILINOGEN 0.2 10/07/2022 0847   NITRITE POSITIVE (A) 06/25/2024 1601   LEUKOCYTESUR MODERATE (A) 06/25/2024 1601   Sepsis Labs: @LABRCNTIP (procalcitonin:4,lacticidven:4) ) Recent Results (from the past 240 hours)  Blood Culture (routine x 2)     Status: None   Collection Time: 06/25/24  2:25 PM   Specimen: BLOOD  Result Value Ref Range Status   Specimen Description BLOOD RIGHT ARM  Final   Special Requests   Final    BOTTLES DRAWN AEROBIC AND ANAEROBIC Blood Culture results may not be optimal due to an inadequate volume of blood received in culture bottles   Culture   Final    NO GROWTH 5 DAYS Performed at [facility], [address], [place], [place] [address]    Report Status 06/30/2024 FINAL  Final  Blood Culture (routine x 2)     Status: Abnormal (Preliminary result)    Collection Time: 06/25/24  2:35 PM   Specimen: BLOOD  Result Value Ref Range Status   Specimen Description   Final    BLOOD LEFT ANTECUBITAL Performed at [facility], [address], [place], [place] [address]    Special Requests   Final    BOTTLES DRAWN AEROBIC AND ANAEROBIC Blood Culture results may not be optimal due to an inadequate volume of blood received in culture bottles Performed at [facility], [address], [place], [place] [address]    Culture  Setup Time   Final    BOTTLES DRAWN AEROBIC ONLY GRAM POSITIVE COCCI Gram Stain Report Called to,Read Back By and Verified With: C. ROGERS RN 06/26/24 @1203  BY J. WHITE CRITICAL RESULT CALLED TO, READ BACK BY AND VERIFIED WITH: RN CSABRA AHLE [phone] @ ELIYA.EDU FH    Culture (A)  Final    METHICILLIN RESISTANT STAPHYLOCOCCUS AUREUS Sent to [facility] for further susceptibility testing. Performed at [facility], [address], [place], [place] [address]    Report Status PENDING  Incomplete   Organism ID, Bacteria METHICILLIN RESISTANT STAPHYLOCOCCUS AUREUS  Final      Susceptibility   Methicillin resistant staphylococcus aureus - MIC*    CIPROFLOXACIN  4 RESISTANT Resistant     ERYTHROMYCIN >=8 RESISTANT Resistant     GENTAMICIN <=0.5 SENSITIVE Sensitive     OXACILLIN >=4 RESISTANT Resistant     TETRACYCLINE <=1 SENSITIVE Sensitive     VANCOMYCIN  1 SENSITIVE Sensitive     TRIMETH /SULFA  <=10 SENSITIVE Sensitive     CLINDAMYCIN <=0.25 SENSITIVE Sensitive     RIFAMPIN <=0.5 SENSITIVE Sensitive     Inducible Clindamycin NEGATIVE Sensitive     LINEZOLID 2 SENSITIVE Sensitive     * METHICILLIN RESISTANT STAPHYLOCOCCUS AUREUS  Blood Culture ID Panel (Reflexed)     Status: Abnormal   Collection Time: 06/25/24  2:35 PM  Result Value Ref Range Status   Enterococcus faecalis NOT DETECTED NOT DETECTED Final   Enterococcus Faecium NOT DETECTED NOT DETECTED Final   Listeria monocytogenes NOT DETECTED NOT DETECTED Final    Staphylococcus species DETECTED (A) NOT DETECTED Final    Comment: CRITICAL RESULT CALLED TO, READ BACK BY AND VERIFIED WITH: RN C. COOK [phone] @ [phone] FH    Staphylococcus aureus (BCID) DETECTED (A) NOT DETECTED Final  Comment: Methicillin (oxacillin)-resistant Staphylococcus aureus (MRSA). MRSA is predictably resistant to beta-lactam antibiotics (except ceftaroline). Preferred therapy is vancomycin  unless clinically contraindicated. Patient requires contact precautions if  hospitalized. CRITICAL RESULT CALLED TO, READ BACK BY AND VERIFIED WITH: RN C. COOK [phone] @ 1822 FH    Staphylococcus epidermidis NOT DETECTED NOT DETECTED Final   Staphylococcus lugdunensis NOT DETECTED NOT DETECTED Final   Streptococcus species NOT DETECTED NOT DETECTED Final   Streptococcus agalactiae NOT DETECTED NOT DETECTED Final   Streptococcus pneumoniae NOT DETECTED NOT DETECTED Final   Streptococcus pyogenes NOT DETECTED NOT DETECTED Final   A.calcoaceticus-baumannii NOT DETECTED NOT DETECTED Final   Bacteroides fragilis NOT DETECTED NOT DETECTED Final   Enterobacterales NOT DETECTED NOT DETECTED Final   Enterobacter cloacae complex NOT DETECTED NOT DETECTED Final   Escherichia coli NOT DETECTED NOT DETECTED Final   Klebsiella aerogenes NOT DETECTED NOT DETECTED Final   Klebsiella oxytoca NOT DETECTED NOT DETECTED Final   Klebsiella pneumoniae NOT DETECTED NOT DETECTED Final   Proteus species NOT DETECTED NOT DETECTED Final   Salmonella species NOT DETECTED NOT DETECTED Final   Serratia marcescens NOT DETECTED NOT DETECTED Final   Haemophilus influenzae NOT DETECTED NOT DETECTED Final   Neisseria meningitidis NOT DETECTED NOT DETECTED Final   Pseudomonas aeruginosa NOT DETECTED NOT DETECTED Final   Stenotrophomonas maltophilia NOT DETECTED NOT DETECTED Final   Candida albicans NOT DETECTED NOT DETECTED Final   Candida auris NOT DETECTED NOT DETECTED Final   Candida glabrata NOT DETECTED NOT DETECTED  Final   Candida krusei NOT DETECTED NOT DETECTED Final   Candida parapsilosis NOT DETECTED NOT DETECTED Final   Candida tropicalis NOT DETECTED NOT DETECTED Final   Cryptococcus neoformans/gattii NOT DETECTED NOT DETECTED Final   Meth resistant mecA/C and MREJ DETECTED (A) NOT DETECTED Final    Comment: CRITICAL RESULT CALLED TO, READ BACK BY AND VERIFIED WITH: RN KYM AHLE [phone] @ [phone] Performed at [facility], [address], [place], [place] [address]   MIC (1 Drug)-     Status: Abnormal   Collection Time: 06/25/24  2:35 PM  Result Value Ref Range Status   Min Inhibitory Conc (1 Drug) Final report (A)  Corrected    Comment: (NOTE) Performed At: [facility] [address] [place], [place] [id number] Jennette Shorter MD Ey:[phone] CORRECTED ON 11/11 AT 1436: PREVIOUSLY REPORTED AS Preliminary report    Source [id number] MRSA DAPTOMYCIN BLOOD  Final    Comment: Performed at [facility], [address], [place], [place] [address]  MIC Result     Status: Abnormal   Collection Time: 06/25/24  2:35 PM  Result Value Ref Range Status   Result 1 (MIC) Comment (A)  Final    Comment: (NOTE) Methicillin - resistant Staphylococcus aureus Identification performed by account, not confirmed by this laboratory. Testing performed by gradient elution. DAPTOMYCIN  0.75 ug/mL  SUSCEPTIBLE Performed At: [facility] [address] [place], [place] [id number] Jennette Shorter MD Ey:[phone]   Urine Culture     Status: Abnormal   Collection Time: 06/25/24  4:01 PM   Specimen: Urine, Random  Result Value Ref Range Status   Specimen Description   Final    URINE, RANDOM Performed at [facility], [address] [place], [place] [address]    Special Requests   Final    NONE Reflexed from [id number] Performed at [facility], [address] [place], [place] [address]    Culture MULTIPLE SPECIES  PRESENT, SUGGEST RECOLLECTION (A)  Final   Report Status 06/27/2024 FINAL   Final  MRSA Next Gen by PCR, Nasal     Status: Abnormal   Collection Time: 06/26/24  2:41 AM   Specimen: Nasal Mucosa; Nasal Swab  Result Value Ref Range Status   MRSA by PCR Next Gen DETECTED (A) NOT DETECTED Final    Comment: RESULT CALLED TO, READ BACK BY AND VERIFIED WITH: B. SUPER [phone], VIRAY,J (NOTE) The GeneXpert MRSA Assay (FDA approved for NASAL specimens only), is one component of a comprehensive MRSA colonization surveillance program. It is not intended to diagnose MRSA infection nor to guide or monitor treatment for MRSA infections. Test performance is not FDA approved in patients less than 80 years old. Performed at [facility], [address] [place], [place] [address]   Culture, blood (Routine X 2) w Reflex to ID Panel     Status: None   Collection Time: 06/27/24  8:56 AM   Specimen: BLOOD  Result Value Ref Range Status   Specimen Description BLOOD BLOOD RIGHT HAND  Final   Special Requests   Final    BOTTLES DRAWN AEROBIC AND ANAEROBIC Blood Culture adequate volume   Culture   Final    NO GROWTH 5 DAYS Performed at [facility], [address], [place], [place] [address]    Report Status 07/02/2024 FINAL  Final  Culture, blood (Routine X 2) w Reflex to ID Panel     Status: None   Collection Time: 06/27/24  9:03 AM   Specimen: BLOOD  Result Value Ref Range Status   Specimen Description BLOOD RIGHT ANTECUBITAL  Final   Special Requests   Final    BOTTLES DRAWN AEROBIC AND ANAEROBIC Blood Culture adequate volume   Culture   Final    NO GROWTH 5 DAYS Performed at [facility], [address] [place], [place] [address]    Report Status 07/02/2024 FINAL  Final     Scheduled Meds:  baclofen   5 mg Oral TID   Chlorhexidine  Gluconate Cloth  6 each Topical Daily   diltiazem   120 mg Oral Daily   latanoprost   1 drop Both Eyes QHS   midodrine  5 mg Oral TID WC   mupirocin ointment   Nasal BID   sertraline  25 mg Oral Daily   tamsulosin   0.4 mg Oral QPM    timolol   1 drop Both Eyes Daily   Continuous Infusions:  DAPTOmycin 700 mg (07/03/24 1218)   heparin  1,200 Units/hr (07/03/24 0655)    Procedures/Studies: ECHOCARDIOGRAM COMPLETE Result Date: 06/27/2024    ECHOCARDIOGRAM REPORT   Patient Name:   RIORDAN WALLE [facility] Date of Exam: 06/27/2024 Medical Rec #:  [id number]         Height:       67.5 in Accession #:    [id number]        Weight:       168.4 lb Date of Birth:  08/29/1945        BSA:          1.890 m Patient Age:    77 years          BP:           115/71 mmHg Patient Gender: M                 HR:           62 bpm. Exam Location:  [facility] Procedure: 2D Echo, Cardiac  Doppler, Color Doppler and Intracardiac            Opacification Agent (Both Spectral and Color Flow Doppler were            utilized during procedure). Indications:    Bacteremia  History:        Patient has prior history of Echocardiogram examinations, most                 recent 04/26/2023. Abnormal ECG, Stroke, Arrythmias:Atrial                 Fibrillation, Signs/Symptoms:Altered Mental Status; Risk                 Factors:Hypertension and Former Smoker. Cancer.  Sonographer:    Ellouise Mose RDCS Referring Phys: [phone] CARLOS MADERA IMPRESSIONS  1. Left ventricular ejection fraction, by estimation, is 60 to 65%. The left ventricle has normal function. Left ventricular endocardial border not optimally defined to evaluate regional wall motion. There is mild left ventricular hypertrophy. Left ventricular diastolic parameters are consistent with Grade I diastolic dysfunction (impaired relaxation).  2. Right ventricular systolic function is normal. The right ventricular size is mildly enlarged. There is normal pulmonary artery systolic pressure. The estimated right ventricular systolic pressure is 24.0 mmHg.  3. The mitral valve is normal in structure. No evidence of mitral valve regurgitation. No evidence of mitral stenosis.  4. The aortic valve was not well visualized. Aortic valve regurgitation  is not visualized. No aortic stenosis is present.  5. The inferior vena cava is normal in size with <50% respiratory variability, suggesting right atrial pressure of 8 mmHg. FINDINGS  Left Ventricle: Left ventricular ejection fraction, by estimation, is 60 to 65%. The left ventricle has normal function. Left ventricular endocardial border not optimally defined to evaluate regional wall motion. Definity contrast agent was given IV to delineate the left ventricular endocardial borders. The left ventricular internal cavity size was normal in size. There is mild left ventricular hypertrophy. Left ventricular diastolic parameters are consistent with Grade I diastolic dysfunction (impaired relaxation). Right Ventricle: The right ventricular size is mildly enlarged. Right vetricular wall thickness was not well visualized. Right ventricular systolic function is normal. There is normal pulmonary artery systolic pressure. The tricuspid regurgitant velocity  is 2.00 m/s, and with an assumed right atrial pressure of 8 mmHg, the estimated right ventricular systolic pressure is 24.0 mmHg. Left Atrium: Left atrial size was normal in size. Right Atrium: Right atrial size was normal in size. Pericardium: There is no evidence of pericardial effusion. Mitral Valve: The mitral valve is normal in structure. No evidence of mitral valve regurgitation. No evidence of mitral valve stenosis. Tricuspid Valve: The tricuspid valve is normal in structure. Tricuspid valve regurgitation is trivial. No evidence of tricuspid stenosis. Aortic Valve: The aortic valve was not well visualized. Aortic valve regurgitation is not visualized. No aortic stenosis is present. Aortic valve mean gradient measures 3.9 mmHg. Aortic valve peak gradient measures 8.8 mmHg. Aortic valve area, by VTI measures 2.36 cm. Pulmonic Valve: The pulmonic valve was not well visualized. Pulmonic valve regurgitation is not visualized. No evidence of pulmonic stenosis. Aorta: The  aortic root and ascending aorta are structurally normal, with no evidence of dilitation. Venous: The inferior vena cava is normal in size with less than 50% respiratory variability, suggesting right atrial pressure of 8 mmHg. IAS/Shunts: No atrial level shunt detected by color flow Doppler.  LEFT VENTRICLE PLAX 2D LVIDd:  4.10 cm      Diastology LVIDs:         2.70 cm      LV e' medial:    9.03 cm/s LV PW:         1.10 cm      LV E/e' medial:  7.5 LV IVS:        1.10 cm      LV e' lateral:   11.10 cm/s LVOT diam:     2.20 cm      LV E/e' lateral: 6.1 LV SV:         62 LV SV Index:   33 LVOT Area:     3.80 cm  LV Volumes (MOD) LV vol d, MOD A2C: 107.0 ml LV vol d, MOD A4C: 96.6 ml LV vol s, MOD A2C: 55.5 ml LV vol s, MOD A4C: 45.8 ml LV SV MOD A2C:     51.5 ml LV SV MOD A4C:     96.6 ml LV SV MOD BP:      52.1 ml RIGHT VENTRICLE             IVC RV S prime:     16.20 cm/s  IVC diam: 2.00 cm TAPSE (M-mode): 2.3 cm LEFT ATRIUM             Index        RIGHT ATRIUM           Index LA diam:        3.50 cm 1.85 cm/m   RA Area:     17.40 cm LA Vol (A2C):   33.2 ml 17.56 ml/m  RA Volume:   46.70 ml  24.71 ml/m LA Vol (A4C):   27.3 ml 14.44 ml/m LA Biplane Vol: 33.4 ml 17.67 ml/m  AORTIC VALVE AV Area (Vmax):    2.06 cm AV Area (Vmean):   2.18 cm AV Area (VTI):     2.36 cm AV Vmax:           148.50 cm/s AV Vmean:          90.400 cm/s AV VTI:            0.261 m AV Peak Grad:      8.8 mmHg AV Mean Grad:      3.9 mmHg LVOT Vmax:         80.45 cm/s LVOT Vmean:        51.900 cm/s LVOT VTI:          0.162 m LVOT/AV VTI ratio: 0.62  AORTA Ao Root diam: 3.00 cm Ao Asc diam:  3.60 cm MITRAL VALVE               TRICUSPID VALVE MV Area (PHT): 3.53 cm    TR Peak grad:   16.0 mmHg MV Decel Time: 215 msec    TR Vmax:        200.00 cm/s MV E velocity: 67.60 cm/s MV A velocity: 84.35 cm/s  SHUNTS MV E/A ratio:  0.80        Systemic VTI:  0.16 m                            Systemic Diam: 2.20 cm Dorn Ross MD  Electronically signed by Dorn Ross MD Signature Date/Time: 06/27/2024/1:53:32 PM    Final    CT CHEST ABDOMEN PELVIS WO CONTRAST Result Date: 06/25/2024 EXAM: CT CHEST, ABDOMEN AND PELVIS WITHOUT CONTRAST  06/25/2024 06:38:36 PM TECHNIQUE: CT of the chest, abdomen and pelvis was performed without the administration of intravenous contrast. Multiplanar reformatted images are provided for review. Automated exposure control, iterative reconstruction, and/or weight based adjustment of the mA/kV was utilized to reduce the radiation dose to as low as reasonably achievable. COMPARISON: Chest radiograph 06/25/2024, CT chest 04/26/2023, CT abdomen and pelvis 10/09/2022. CLINICAL HISTORY: Sepsis. FINDINGS: CHEST: MEDIASTINUM AND LYMPH NODES: Heart and pericardium are unremarkable. The central airways are clear. No mediastinal, hilar or axillary lymphadenopathy. LUNGS AND PLEURA: Layering debris in the trachea. Mucous plugging in the lower lobes. Lower lobe bronchial wall thickening. Scarring in the lower lobes. No pleural effusion or pneumothorax. ABDOMEN AND PELVIS: LIVER: The liver is unremarkable. GALLBLADDER AND BILE DUCTS: Gallbladder is unremarkable. No biliary ductal dilatation. SPLEEN: No acute abnormality. PANCREAS: No acute abnormality. ADRENAL GLANDS: No acute abnormality. KIDNEYS, URETERS AND BLADDER: Cord catheter and gas in the decompressed bladder. No stones in the kidneys or ureters. No hydronephrosis. No perinephric or periureteral stranding. GI AND BOWEL: Stomach demonstrates no acute abnormality. Extensive sigmoid colon diverticulosis. Trace stranding about the proximal sigmoid colon (cervical series 5 image 517). There is no bowel obstruction. REPRODUCTIVE ORGANS: Enlarged prostate. PERITONEUM AND RETROPERITONEUM: No ascites. No free air. VASCULATURE: Aorta is normal in caliber. ABDOMINAL AND PELVIS LYMPH NODES: No lymphadenopathy. BONES AND SOFT TISSUES: No acute osseous abnormality. No focal soft  tissue abnormality. IMPRESSION: 1. Layering debris in the trachea with mucous plugging and lower lobe bronchial wall thickening, reflecting airway inflammation/infection. 2. Extensive left colon diverticulosis with mild sigmoid diverticulitis. Electronically signed by: Norman Gatlin MD 06/25/2024 06:54 PM EST RP Workstation: [id number]   CT Head Wo Contrast Result Date: 06/25/2024 CLINICAL DATA:  Headache x2 weeks. EXAM: CT HEAD WITHOUT CONTRAST TECHNIQUE: Contiguous axial images were obtained from the base of the skull through the vertex without intravenous contrast. RADIATION DOSE REDUCTION: This exam was performed according to the departmental dose-optimization program which includes automated exposure control, adjustment of the mA and/or kV according to patient size and/or use of iterative reconstruction technique. COMPARISON:  August 31, 2023 FINDINGS: Brain: There is generalized cerebral atrophy with widening of the extra-axial spaces and ventricular dilatation. There are areas of decreased attenuation within the white matter tracts of the supratentorial brain, consistent with microvascular disease changes. 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 Vascular: No hyperdense vessel or unexpected calcification. Skull: Normal. Negative for fracture or focal lesion. Sinuses/Orbits: There is mild to moderate severity bilateral ethmoid sinus mucosal thickening. Other: None. IMPRESSION: 1. 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 2. 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 3. No acute intracranial abnormality. Electronically Signed   By: Suzen Dials M.D.   On: 06/25/2024 18:53   DG Chest Port 1 View Result Date: 06/25/2024 EXAM: 1 VIEW(S) XRAY OF THE CHEST 06/25/2024 03:04:00 PM COMPARISON: Chest x-ray 01/10/2024. Chest CT  01/24/2024. CLINICAL HISTORY: Questionable sepsis - evaluate for abnormality. FINDINGS: LUNGS AND PLEURA: Small nodular density seen on previous x-ray likely overlies the first rib end on today's study. No pulmonary edema. No pleural effusion. No pneumothorax. HEART AND MEDIASTINUM: No acute abnormality of the cardiac and mediastinal silhouettes. BONES AND SOFT TISSUES: No acute osseous abnormality. IMPRESSION: 1. No acute findings. 2. Small nodular density seen on previous x-ray likely overlies the first rib end on today's study. Follow-up chest CT should be considered. Electronically signed by:  Greig Pique MD 06/25/2024 03:34 PM EST RP Workstation: [id number]    Alm Schneider, DO  [facility]  If 7PM-7AM, please contact night-coverage [email or web address] Password [email or web address] 07/03/2024, 5:26 PM   LOS: 8 days

## 2024-07-03 NOTE — Plan of Care (Signed)
  Problem: Education: Goal: Knowledge of General Education information will improve Description: Including pain rating scale, medication(s)/side effects and non-pharmacologic comfort measures Outcome: Progressing   Problem: Health Behavior/Discharge Planning: Goal: Ability to manage health-related needs will improve Outcome: Progressing   Problem: Clinical Measurements: Goal: Cardiovascular complication will be avoided Outcome: Progressing   Problem: Activity: Goal: Risk for activity intolerance will decrease Outcome: Progressing   Problem: Coping: Goal: Level of anxiety will decrease Outcome: Progressing   Problem: Elimination: Goal: Will not experience complications related to urinary retention Outcome: Progressing   Problem: Pain Managment: Goal: General experience of comfort will improve and/or be controlled Outcome: Progressing

## 2024-07-04 ENCOUNTER — Other Ambulatory Visit: Payer: Self-pay

## 2024-07-04 DIAGNOSIS — N179 Acute kidney failure, unspecified: Secondary | ICD-10-CM | POA: Diagnosis not present

## 2024-07-04 DIAGNOSIS — R7881 Bacteremia: Secondary | ICD-10-CM | POA: Diagnosis not present

## 2024-07-04 DIAGNOSIS — B9562 Methicillin resistant Staphylococcus aureus infection as the cause of diseases classified elsewhere: Secondary | ICD-10-CM | POA: Diagnosis not present

## 2024-07-04 DIAGNOSIS — A419 Sepsis, unspecified organism: Secondary | ICD-10-CM | POA: Diagnosis not present

## 2024-07-04 DIAGNOSIS — R6520 Severe sepsis without septic shock: Secondary | ICD-10-CM | POA: Diagnosis not present

## 2024-07-04 LAB — HEPARIN LEVEL (UNFRACTIONATED): Heparin Unfractionated: 0.97 [IU]/mL — ABNORMAL HIGH (ref 0.30–0.70)

## 2024-07-04 LAB — BASIC METABOLIC PANEL WITH GFR
Anion gap: 8 (ref 5–15)
BUN: 14 mg/dL (ref 8–23)
CO2: 28 mmol/L (ref 22–32)
Calcium: 8.5 mg/dL — ABNORMAL LOW (ref 8.9–10.3)
Chloride: 101 mmol/L (ref 98–111)
Creatinine, Ser: 0.79 mg/dL (ref 0.61–1.24)
GFR, Estimated: >60 mL/min (ref >60)
Glucose, Bld: 112 mg/dL — ABNORMAL HIGH (ref 70–99)
Potassium: 4.1 mmol/L (ref 3.5–5.1)
Sodium: 137 mmol/L (ref 135–145)

## 2024-07-04 LAB — CBC
HCT: 51.4 % (ref 39.0–52.0)
Hemoglobin: 16.3 g/dL (ref 13.0–17.0)
MCH: 26.4 pg (ref 26.0–34.0)
MCHC: 31.7 g/dL (ref 30.0–36.0)
MCV: 83.3 fL (ref 80.0–100.0)
Platelets: 476 K/uL — ABNORMAL HIGH (ref 150–400)
RBC: 6.17 MIL/uL — ABNORMAL HIGH (ref 4.22–5.81)
RDW: 20.9 % — ABNORMAL HIGH (ref 11.5–15.5)
WBC: 13.1 K/uL — ABNORMAL HIGH (ref 4.0–10.5)
nRBC: 0 % (ref 0.0–0.2)

## 2024-07-04 LAB — MAGNESIUM: Magnesium: 2.2 mg/dL (ref 1.7–2.4)

## 2024-07-04 LAB — GLUCOSE, CAPILLARY: Glucose-Capillary: 136 mg/dL — ABNORMAL HIGH (ref 70–99)

## 2024-07-04 LAB — APTT
aPTT: >200 s (ref 24–36)
aPTT: 83 s — ABNORMAL HIGH (ref 24–36)

## 2024-07-04 NOTE — Progress Notes (Signed)
 PROGRESS NOTE  Mason [name] FMW:[id number] DOB: 04/15/1946 DOA: 06/25/2024 PCP: Cleatus Arlyss RAMAN, MD  Brief History:  Per H&P written by Dr. Pearlean on 06/25/2024 Mason [name] is a 78 y.o. 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 Patient was brought to the ED reports of elevated white blood count of 42,000 and fever.  He also presented with headache, reports intermittent right eye pain, that self resolved, and has been going on for a long time, he saw an eye doctor and was told nothing was wrong with his eye.  He denies eye pain today.  He has a chronic indwelling Foley catheter, it was exchanged earlier today, and had blood.  Patient tells me prior to the exchange he did not have any blood in his urine.  Also reports a cough.   ED Course: Temperature max-99.5.  Heart rate 104-119.  Respiratory rate 13-26.  O2 sats 90 to 97% on room air.  Systolic blood pressure  92 to 132.  Lactic acidosis 3> 5> 2.9> 3.4. WBC 38.4. Creatinine elevated 1.56. UA with positive nitrites and leukocytes, many bacteria. Head CT-no acute abnormality  CT chest abdomen pelvis without contrast- Layering debris in the trachea with mucous plugging and lower lobe bronchial wall thickening, reflecting airway inflammation/infection. Extensive left colon diverticulosis with mild sigmoid diverticulitis. Blood and urine cultures obtained. IV fluid bolus given IV cefepime , and azithromycin started.  When 06/25/24 blood cultures returned MRSA, cefepime  and azithro were discontinued and he was started on IV vancomycin .  He gradually improved clinically.  Repeat blood cultures remained neg x 5 days.  ID was consulted and recommended TEE.  TEE was not successful due to unable to pass probe down esophagus.  GI was consulted for possible EGD.  Ultimately, patient decided he did not want any further  procedures.  Palliative medicine was consulted and continued to follow the patient regarding GOC as pt intermittently refused meds.   Assessment/Plan: severe sepsis with septic shock/MRSA bacteremia - due to MRSA bacteremia--source unclear - Sepsis present at time of admission with tachycardia, leukocytosis, endorgan dysfunction with acute kidney injury and the presence of lactic acidosis. - 06/25/24 blood culture = MRSA - Patient with positive MRSA PCR - started IV vancomycin >>daptomycin 07/03/24 - 2D echo not demonstrating large vegetations, grade 1 diastolic dysfunction and no wall motion abnormalities. - Appreciate assistance by cardiology service performing TEE. - Afebrile and in no acute distress. - No longer requiring pressors support; overall sepsis features resolved.  - Repeat blood culture--NGTD - TEE scheduled 07/02/2024--unable to pass probe>>GI consulted - 11/13--after GOC discussion--pt does not want any further procedures--cancel EGD and cancel TEE - 11/13--discussed with ID>>place PICC for daptomycin with stop date 08/08/24   paroxysmal atrial fibrillation - Require transient use of Cardizem  drip>>po diltiazem  - Rate controlled and currently sinus;  -continue Cardizem  orally; transitioning to extended release form once a day tablet. - personally reviewed EKG on 06/26/24--afib RVR, nonspecific ST changes - currently on IV heparin  - plan to transition to po apixaban    Acute metabolic encephalopathy - due to acute infection - Head CT negative for acute abnormality - improved, back to baseline   acute kidney injury - baseline creatinine 0.7-0.9 - In the setting of sepsis and ATN - Continue aggressive fluid resuscitation - serum creatinine up to 1.56 - improved   history of stroke with residual right-sided deficits and  aphasia - No new focal deficit - Continue secondary prevention (patient on chronic Eliquis ).   urinary retention/BPH and chronic indwelling Foley  catheter - Foley catheter in place (recently exchanged) - Continue Flomax  - urine culture unrevealing                   Family Communication:   no Family at bedside   Consultants:  ID, palliative   Code Status:  DNR   DVT Prophylaxis:  IV Heparin >>apixaban      Procedures: As Listed in Progress Note Above   Antibiotics: Vanco 11/6>>11/11 Dapto 11/12>>               Subjective: Patient denies fevers, chills, headache, chest pain, dyspnea, nausea, vomiting, diarrhea, abdominal pain, dysuria, hematuria, hematochezia, and melena.   Objective: Vitals:   07/03/24 2027 07/04/24 0613 07/04/24 0837 07/04/24 1439  BP: 111/72 (!) 143/91 (!) 146/86 121/76  Pulse: 68 76  64  Resp: (!) 21 19  20   Temp: (!) 97.4 F (36.3 C) (!) 97.4 F (36.3 C)  98.2 F (36.8 C)  TempSrc: Oral Oral  Oral  SpO2: 92% 95%  94%  Weight:      Height:        Intake/Output Summary (Last 24 hours) at 07/04/2024 1743 Last data filed at 07/04/2024 1500 Gross per 24 hour  Intake 557.7 ml  Output 1300 ml  Net -742.3 ml   Weight change:  Exam:  General:  Pt is alert, follows commands appropriately, not in acute distress HEENT: No icterus, No thrush, No neck mass, [place]/AT Cardiovascular: RRR, S1/S2, no rubs, no gallops Respiratory: fine bibasilar crackles. No wheeze Abdomen: Soft/+BS, non tender, non distended, no guarding Extremities: No edema, No lymphangitis, No petechiae, No rashes, no synovitis   Data Reviewed: I have personally reviewed following labs and imaging studies Basic Metabolic Panel: Recent Labs  Lab 06/30/24 0429 07/01/24 0419 07/02/24 0420 07/03/24 0358 07/04/24 0849  NA 139  --  139  --  137  K 3.7  --  4.0  --  4.1  CL 105  --  105  --  101  CO2 27  --  27  --  28  GLUCOSE 87  --  86  --  112*  BUN 13  --  13  --  14  CREATININE 0.83 0.69 0.76 0.69 0.79  CALCIUM 8.1*  --  8.3*  --  8.5*  MG  --   --  2.2  --  2.2   Liver Function Tests: No results  for input(s): AST, ALT, ALKPHOS, BILITOT, PROT, ALBUMIN in the last 168 hours. No results for input(s): LIPASE, AMYLASE in the last 168 hours. No results for input(s): AMMONIA in the last 168 hours. Coagulation Profile: No results for input(s): INR, PROTIME in the last 168 hours. CBC: Recent Labs  Lab 06/30/24 0429 07/02/24 0420 07/03/24 0358 07/04/24 0849  WBC 14.9* 13.9* 12.5* 13.1*  HGB 14.5 15.1 15.8 16.3  HCT 45.7 47.5 49.1 51.4  MCV 82.0 82.2 82.5 83.3  PLT 457* 464* 488* 476*   Cardiac Enzymes: Recent Labs  Lab 07/03/24 0358  CKTOTAL <10*   BNP: Invalid input(s): POCBNP CBG: Recent Labs  Lab 06/30/24 0742  GLUCAP 110*   HbA1C: No results for input(s): HGBA1C in the last 72 hours. Urine analysis:    Component Value Date/Time   COLORURINE YELLOW 06/25/2024 1601   APPEARANCEUR CLOUDY (A) 06/25/2024 1601   LABSPEC 1.020 06/25/2024 1601   PHURINE  6.0 06/25/2024 1601   GLUCOSEU NEGATIVE 06/25/2024 1601   GLUCOSEU NEGATIVE 10/07/2022 0847   HGBUR LARGE (A) 06/25/2024 1601   HGBUR negative 01/01/2009 0952   BILIRUBINUR NEGATIVE 06/25/2024 1601   KETONESUR NEGATIVE 06/25/2024 1601   PROTEINUR 100 (A) 06/25/2024 1601   UROBILINOGEN 0.2 10/07/2022 0847   NITRITE POSITIVE (A) 06/25/2024 1601   LEUKOCYTESUR MODERATE (A) 06/25/2024 1601   Sepsis Labs: @LABRCNTIP (procalcitonin:4,lacticidven:4) ) Recent Results (from the past 240 hours)  Blood Culture (routine x 2)     Status: None   Collection Time: 06/25/24  2:25 PM   Specimen: BLOOD  Result Value Ref Range Status   Specimen Description BLOOD RIGHT ARM  Final   Special Requests   Final    BOTTLES DRAWN AEROBIC AND ANAEROBIC Blood Culture results may not be optimal due to an inadequate volume of blood received in culture bottles   Culture   Final    NO GROWTH 5 DAYS Performed at [facility], [address], [place], [place] [address]    Report Status 06/30/2024 FINAL  Final  Blood  Culture (routine x 2)     Status: Abnormal (Preliminary result)   Collection Time: 06/25/24  2:35 PM   Specimen: BLOOD  Result Value Ref Range Status   Specimen Description   Final    BLOOD LEFT ANTECUBITAL Performed at [facility], [address] [place], [place] [address]    Special Requests   Final    BOTTLES DRAWN AEROBIC AND ANAEROBIC Blood Culture results may not be optimal due to an inadequate volume of blood received in culture bottles Performed at [facility], [address], [place], [place] [address]    Culture  Setup Time   Final    BOTTLES DRAWN AEROBIC ONLY GRAM POSITIVE COCCI Gram Stain Report Called to,Read Back By and Verified With: C. ROGERS RN 06/26/24 @1203  BY J. WHITE CRITICAL RESULT CALLED TO, READ BACK BY AND VERIFIED WITH: RN CSABRA AHLE [phone] @ ELIYA.EDU FH    Culture (A)  Final    METHICILLIN RESISTANT STAPHYLOCOCCUS AUREUS Sent to [facility] for further susceptibility testing. Performed at [facility], [address], [place], [place] [address]    Report Status PENDING  Incomplete   Organism ID, Bacteria METHICILLIN RESISTANT STAPHYLOCOCCUS AUREUS  Final      Susceptibility   Methicillin resistant staphylococcus aureus - MIC*    CIPROFLOXACIN  4 RESISTANT Resistant     ERYTHROMYCIN >=8 RESISTANT Resistant     GENTAMICIN <=0.5 SENSITIVE Sensitive     OXACILLIN >=4 RESISTANT Resistant     TETRACYCLINE <=1 SENSITIVE Sensitive     VANCOMYCIN  1 SENSITIVE Sensitive     TRIMETH /SULFA  <=10 SENSITIVE Sensitive     CLINDAMYCIN <=0.25 SENSITIVE Sensitive     RIFAMPIN <=0.5 SENSITIVE Sensitive     Inducible Clindamycin NEGATIVE Sensitive     LINEZOLID 2 SENSITIVE Sensitive     * METHICILLIN RESISTANT STAPHYLOCOCCUS AUREUS  Blood Culture ID Panel (Reflexed)     Status: Abnormal   Collection Time: 06/25/24  2:35 PM  Result Value Ref Range Status   Enterococcus faecalis NOT DETECTED NOT DETECTED Final   Enterococcus Faecium NOT DETECTED NOT DETECTED Final    Listeria monocytogenes NOT DETECTED NOT DETECTED Final   Staphylococcus species DETECTED (A) NOT DETECTED Final    Comment: CRITICAL RESULT CALLED TO, READ BACK BY AND VERIFIED WITH: RN C. COOK [phone] @ [phone] FH    Staphylococcus aureus (BCID) DETECTED (A) NOT  DETECTED Final    Comment: Methicillin (oxacillin)-resistant Staphylococcus aureus (MRSA). MRSA is predictably resistant to beta-lactam antibiotics (except ceftaroline). Preferred therapy is vancomycin  unless clinically contraindicated. Patient requires contact precautions if  hospitalized. CRITICAL RESULT CALLED TO, READ BACK BY AND VERIFIED WITH: RN C. COOK [phone] @ 1822 FH    Staphylococcus epidermidis NOT DETECTED NOT DETECTED Final   Staphylococcus lugdunensis NOT DETECTED NOT DETECTED Final   Streptococcus species NOT DETECTED NOT DETECTED Final   Streptococcus agalactiae NOT DETECTED NOT DETECTED Final   Streptococcus pneumoniae NOT DETECTED NOT DETECTED Final   Streptococcus pyogenes NOT DETECTED NOT DETECTED Final   A.calcoaceticus-baumannii NOT DETECTED NOT DETECTED Final   Bacteroides fragilis NOT DETECTED NOT DETECTED Final   Enterobacterales NOT DETECTED NOT DETECTED Final   Enterobacter cloacae complex NOT DETECTED NOT DETECTED Final   Escherichia coli NOT DETECTED NOT DETECTED Final   Klebsiella aerogenes NOT DETECTED NOT DETECTED Final   Klebsiella oxytoca NOT DETECTED NOT DETECTED Final   Klebsiella pneumoniae NOT DETECTED NOT DETECTED Final   Proteus species NOT DETECTED NOT DETECTED Final   Salmonella species NOT DETECTED NOT DETECTED Final   Serratia marcescens NOT DETECTED NOT DETECTED Final   Haemophilus influenzae NOT DETECTED NOT DETECTED Final   Neisseria meningitidis NOT DETECTED NOT DETECTED Final   Pseudomonas aeruginosa NOT DETECTED NOT DETECTED Final   Stenotrophomonas maltophilia NOT DETECTED NOT DETECTED Final   Candida albicans NOT DETECTED NOT DETECTED Final   Candida auris NOT DETECTED NOT  DETECTED Final   Candida glabrata NOT DETECTED NOT DETECTED Final   Candida krusei NOT DETECTED NOT DETECTED Final   Candida parapsilosis NOT DETECTED NOT DETECTED Final   Candida tropicalis NOT DETECTED NOT DETECTED Final   Cryptococcus neoformans/gattii NOT DETECTED NOT DETECTED Final   Meth resistant mecA/C and MREJ DETECTED (A) NOT DETECTED Final    Comment: CRITICAL RESULT CALLED TO, READ BACK BY AND VERIFIED WITH: RN KYM AHLE [phone] @ [phone] FH Performed at [facility], [address], [place], [place] [address]   MIC (1 Drug)-     Status: Abnormal   Collection Time: 06/25/24  2:35 PM  Result Value Ref Range Status   Min Inhibitory Conc (1 Drug) Final report (A)  Corrected    Comment: (NOTE) Performed At: [facility] [address] [place], [place] [id number] Jennette Shorter MD Ey:[id number] CORRECTED ON 11/11 AT 1436: PREVIOUSLY REPORTED AS Preliminary report    Source [id number] MRSA DAPTOMYCIN BLOOD  Final    Comment: Performed at [facility], [address] [place], [place] [address]  MIC Result     Status: Abnormal   Collection Time: 06/25/24  2:35 PM  Result Value Ref Range Status   Result 1 (MIC) Comment (A)  Final    Comment: (NOTE) Methicillin - resistant Staphylococcus aureus Identification performed by account, not confirmed by this laboratory. Testing performed by gradient elution. DAPTOMYCIN  0.75 ug/mL  SUSCEPTIBLE Performed At: [facility] [address] [place], [place] [id number] Jennette Shorter MD Ey:[phone]   Urine Culture     Status: Abnormal   Collection Time: 06/25/24  4:01 PM   Specimen: Urine, Random  Result Value Ref Range Status   Specimen Description   Final    URINE, RANDOM Performed at [facility], [address] [place], [place] [address]    Special Requests   Final    NONE Reflexed from [id number] Performed at [facility], [address], [place], [place] [address]  Culture MULTIPLE SPECIES PRESENT,  SUGGEST RECOLLECTION (A)  Final   Report Status 06/27/2024 FINAL  Final  MRSA Next Gen by PCR, Nasal     Status: Abnormal   Collection Time: 06/26/24  2:41 AM   Specimen: Nasal Mucosa; Nasal Swab  Result Value Ref Range Status   MRSA by PCR Next Gen DETECTED (A) NOT DETECTED Final    Comment: RESULT CALLED TO, READ BACK BY AND VERIFIED WITH: B. SUPER [phone], VIRAY,J (NOTE) The GeneXpert MRSA Assay (FDA approved for NASAL specimens only), is one component of a comprehensive MRSA colonization surveillance program. It is not intended to diagnose MRSA infection nor to guide or monitor treatment for MRSA infections. Test performance is not FDA approved in patients less than 48 years old. Performed at [facility], [address] [place], [place] [address]   Culture, blood (Routine X 2) w Reflex to ID Panel     Status: None   Collection Time: 06/27/24  8:56 AM   Specimen: BLOOD  Result Value Ref Range Status   Specimen Description BLOOD BLOOD RIGHT HAND  Final   Special Requests   Final    BOTTLES DRAWN AEROBIC AND ANAEROBIC Blood Culture adequate volume   Culture   Final    NO GROWTH 5 DAYS Performed at [facility], [address], [place], [place] [address]    Report Status 07/02/2024 FINAL  Final  Culture, blood (Routine X 2) w Reflex to ID Panel     Status: None   Collection Time: 06/27/24  9:03 AM   Specimen: BLOOD  Result Value Ref Range Status   Specimen Description BLOOD RIGHT ANTECUBITAL  Final   Special Requests   Final    BOTTLES DRAWN AEROBIC AND ANAEROBIC Blood Culture adequate volume   Culture   Final    NO GROWTH 5 DAYS Performed at [facility], [address], [place], [place] [address]    Report Status 07/02/2024 FINAL  Final     Scheduled Meds:  baclofen   5 mg Oral TID   Chlorhexidine  Gluconate Cloth  6 each Topical Daily   diltiazem   120 mg Oral Daily   latanoprost   1 drop Both Eyes QHS   mupirocin ointment   Nasal BID   sertraline  25 mg Oral Daily    tamsulosin   0.4 mg Oral QPM   timolol   1 drop Both Eyes Daily   Continuous Infusions:  DAPTOmycin 700 mg (07/04/24 1428)   heparin  1,200 Units/hr (07/04/24 1640)    Procedures/Studies: US  EKG SITE RITE Result Date: 07/04/2024 If Site Rite image not attached, placement could not be confirmed due to current cardiac rhythm.  ECHOCARDIOGRAM COMPLETE Result Date: 06/27/2024    ECHOCARDIOGRAM REPORT   Patient Name:   Mason [name] [facility] Date of Exam: 06/27/2024 Medical Rec #:  [id number]         Height:       67.5 in Accession #:    [id number]        Weight:       168.4 lb Date of Birth:  05/30/1946        BSA:          1.890 m Patient Age:    77 years          BP:           115/71 mmHg Patient Gender: M                 HR:  62 bpm. Exam Location:  [facility] Procedure: 2D Echo, Cardiac Doppler, Color Doppler and Intracardiac            Opacification Agent (Both Spectral and Color Flow Doppler were            utilized during procedure). Indications:    Bacteremia  History:        Patient has prior history of Echocardiogram examinations, most                 recent 04/26/2023. Abnormal ECG, Stroke, Arrythmias:Atrial                 Fibrillation, Signs/Symptoms:Altered Mental Status; Risk                 Factors:Hypertension and Former Smoker. Cancer.  Sonographer:    Ellouise Mose RDCS Referring Phys: [phone] CARLOS MADERA IMPRESSIONS  1. Left ventricular ejection fraction, by estimation, is 60 to 65%. The left ventricle has normal function. Left ventricular endocardial border not optimally defined to evaluate regional wall motion. There is mild left ventricular hypertrophy. Left ventricular diastolic parameters are consistent with Grade I diastolic dysfunction (impaired relaxation).  2. Right ventricular systolic function is normal. The right ventricular size is mildly enlarged. There is normal pulmonary artery systolic pressure. The estimated right ventricular systolic pressure is 24.0 mmHg.  3. The mitral  valve is normal in structure. No evidence of mitral valve regurgitation. No evidence of mitral stenosis.  4. The aortic valve was not well visualized. Aortic valve regurgitation is not visualized. No aortic stenosis is present.  5. The inferior vena cava is normal in size with <50% respiratory variability, suggesting right atrial pressure of 8 mmHg. FINDINGS  Left Ventricle: Left ventricular ejection fraction, by estimation, is 60 to 65%. The left ventricle has normal function. Left ventricular endocardial border not optimally defined to evaluate regional wall motion. Definity contrast agent was given IV to delineate the left ventricular endocardial borders. The left ventricular internal cavity size was normal in size. There is mild left ventricular hypertrophy. Left ventricular diastolic parameters are consistent with Grade I diastolic dysfunction (impaired relaxation). Right Ventricle: The right ventricular size is mildly enlarged. Right vetricular wall thickness was not well visualized. Right ventricular systolic function is normal. There is normal pulmonary artery systolic pressure. The tricuspid regurgitant velocity  is 2.00 m/s, and with an assumed right atrial pressure of 8 mmHg, the estimated right ventricular systolic pressure is 24.0 mmHg. Left Atrium: Left atrial size was normal in size. Right Atrium: Right atrial size was normal in size. Pericardium: There is no evidence of pericardial effusion. Mitral Valve: The mitral valve is normal in structure. No evidence of mitral valve regurgitation. No evidence of mitral valve stenosis. Tricuspid Valve: The tricuspid valve is normal in structure. Tricuspid valve regurgitation is trivial. No evidence of tricuspid stenosis. Aortic Valve: The aortic valve was not well visualized. Aortic valve regurgitation is not visualized. No aortic stenosis is present. Aortic valve mean gradient measures 3.9 mmHg. Aortic valve peak gradient measures 8.8 mmHg. Aortic valve area,  by VTI measures 2.36 cm. Pulmonic Valve: The pulmonic valve was not well visualized. Pulmonic valve regurgitation is not visualized. No evidence of pulmonic stenosis. Aorta: The aortic root and ascending aorta are structurally normal, with no evidence of dilitation. Venous: The inferior vena cava is normal in size with less than 50% respiratory variability, suggesting right atrial pressure of 8 mmHg. IAS/Shunts: No atrial level shunt detected by color flow Doppler.  LEFT VENTRICLE PLAX 2D LVIDd:         4.10 cm      Diastology LVIDs:         2.70 cm      LV e' medial:    9.03 cm/s LV PW:         1.10 cm      LV E/e' medial:  7.5 LV IVS:        1.10 cm      LV e' lateral:   11.10 cm/s LVOT diam:     2.20 cm      LV E/e' lateral: 6.1 LV SV:         62 LV SV Index:   33 LVOT Area:     3.80 cm  LV Volumes (MOD) LV vol d, MOD A2C: 107.0 ml LV vol d, MOD A4C: 96.6 ml LV vol s, MOD A2C: 55.5 ml LV vol s, MOD A4C: 45.8 ml LV SV MOD A2C:     51.5 ml LV SV MOD A4C:     96.6 ml LV SV MOD BP:      52.1 ml RIGHT VENTRICLE             IVC RV S prime:     16.20 cm/s  IVC diam: 2.00 cm TAPSE (M-mode): 2.3 cm LEFT ATRIUM             Index        RIGHT ATRIUM           Index LA diam:        3.50 cm 1.85 cm/m   RA Area:     17.40 cm LA Vol (A2C):   33.2 ml 17.56 ml/m  RA Volume:   46.70 ml  24.71 ml/m LA Vol (A4C):   27.3 ml 14.44 ml/m LA Biplane Vol: 33.4 ml 17.67 ml/m  AORTIC VALVE AV Area (Vmax):    2.06 cm AV Area (Vmean):   2.18 cm AV Area (VTI):     2.36 cm AV Vmax:           148.50 cm/s AV Vmean:          90.400 cm/s AV VTI:            0.261 m AV Peak Grad:      8.8 mmHg AV Mean Grad:      3.9 mmHg LVOT Vmax:         80.45 cm/s LVOT Vmean:        51.900 cm/s LVOT VTI:          0.162 m LVOT/AV VTI ratio: 0.62  AORTA Ao Root diam: 3.00 cm Ao Asc diam:  3.60 cm MITRAL VALVE               TRICUSPID VALVE MV Area (PHT): 3.53 cm    TR Peak grad:   16.0 mmHg MV Decel Time: 215 msec    TR Vmax:        200.00 cm/s MV E  velocity: 67.60 cm/s MV A velocity: 84.35 cm/s  SHUNTS MV E/A ratio:  0.80        Systemic VTI:  0.16 m                            Systemic Diam: 2.20 cm Dorn Ross MD Electronically signed by Dorn Ross MD Signature Date/Time: 06/27/2024/1:53:32 PM    Final    CT CHEST ABDOMEN PELVIS  WO CONTRAST Result Date: 06/25/2024 EXAM: CT CHEST, ABDOMEN AND PELVIS WITHOUT CONTRAST 06/25/2024 06:38:36 PM TECHNIQUE: CT of the chest, abdomen and pelvis was performed without the administration of intravenous contrast. Multiplanar reformatted images are provided for review. Automated exposure control, iterative reconstruction, and/or weight based adjustment of the mA/kV was utilized to reduce the radiation dose to as low as reasonably achievable. COMPARISON: Chest radiograph 06/25/2024, CT chest 04/26/2023, CT abdomen and pelvis 10/09/2022. CLINICAL HISTORY: Sepsis. FINDINGS: CHEST: MEDIASTINUM AND LYMPH NODES: Heart and pericardium are unremarkable. The central airways are clear. No mediastinal, hilar or axillary lymphadenopathy. LUNGS AND PLEURA: Layering debris in the trachea. Mucous plugging in the lower lobes. Lower lobe bronchial wall thickening. Scarring in the lower lobes. No pleural effusion or pneumothorax. ABDOMEN AND PELVIS: LIVER: The liver is unremarkable. GALLBLADDER AND BILE DUCTS: Gallbladder is unremarkable. No biliary ductal dilatation. SPLEEN: No acute abnormality. PANCREAS: No acute abnormality. ADRENAL GLANDS: No acute abnormality. KIDNEYS, URETERS AND BLADDER: Cord catheter and gas in the decompressed bladder. No stones in the kidneys or ureters. No hydronephrosis. No perinephric or periureteral stranding. GI AND BOWEL: Stomach demonstrates no acute abnormality. Extensive sigmoid colon diverticulosis. Trace stranding about the proximal sigmoid colon (cervical series 5 image 517). There is no bowel obstruction. REPRODUCTIVE ORGANS: Enlarged prostate. PERITONEUM AND RETROPERITONEUM: No ascites. No  free air. VASCULATURE: Aorta is normal in caliber. ABDOMINAL AND PELVIS LYMPH NODES: No lymphadenopathy. BONES AND SOFT TISSUES: No acute osseous abnormality. No focal soft tissue abnormality. IMPRESSION: 1. Layering debris in the trachea with mucous plugging and lower lobe bronchial wall thickening, reflecting airway inflammation/infection. 2. Extensive left colon diverticulosis with mild sigmoid diverticulitis. Electronically signed by: Norman Gatlin MD 06/25/2024 06:54 PM EST RP Workstation: [id number]   CT Head Wo Contrast Result Date: 06/25/2024 CLINICAL DATA:  Headache x2 weeks. EXAM: CT HEAD WITHOUT CONTRAST TECHNIQUE: Contiguous axial images were obtained from the base of the skull through the vertex without intravenous contrast. RADIATION DOSE REDUCTION: This exam was performed according to the departmental dose-optimization program which includes automated exposure control, adjustment of the mA and/or kV according to patient size and/or use of iterative reconstruction technique. COMPARISON:  August 31, 2023 FINDINGS: Brain: There is generalized cerebral atrophy with widening of the extra-axial spaces and ventricular dilatation. There are areas of decreased attenuation within the white matter tracts of the supratentorial brain, consistent with microvascular disease changes. 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 Vascular: No hyperdense vessel or unexpected calcification. Skull: Normal. Negative for fracture or focal lesion. Sinuses/Orbits: There is mild to moderate severity bilateral ethmoid sinus mucosal thickening. Other: None. IMPRESSION: 1. 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 2. 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 3. No acute intracranial abnormality. Electronically Signed   By: Suzen Dials M.D.   On:  06/25/2024 18:53   DG Chest Port 1 View Result Date: 06/25/2024 EXAM: 1 VIEW(S) XRAY OF THE CHEST 06/25/2024 03:04:00 PM COMPARISON: Chest x-ray 01/10/2024. Chest CT 01/24/2024. CLINICAL HISTORY: Questionable sepsis - evaluate for abnormality. FINDINGS: LUNGS AND PLEURA: Small nodular density seen on previous x-ray likely overlies the first rib end on today's study. No pulmonary edema. No pleural effusion. No pneumothorax. HEART AND MEDIASTINUM: No acute abnormality of the cardiac and mediastinal silhouettes. BONES AND SOFT TISSUES: No acute osseous abnormality. IMPRESSION: 1. No acute findings. 2. Small nodular density seen on previous x-ray likely overlies the first rib  end on today's study. Follow-up chest CT should be considered. Electronically signed by: Greig Pique MD 06/25/2024 03:34 PM EST RP Workstation: [id number]    Alm Schneider, DO  [facility]  If 7PM-7AM, please contact night-coverage [email or web address] Password [email or web address] 07/04/2024, 5:43 PM   LOS: 9 days

## 2024-07-04 NOTE — Progress Notes (Signed)
 Daily Progress Note   Patient Name: Mason [name]       Date: 07/04/2024 DOB: 1946-02-26  Age: 78 y.o. MRN#: [id number] Attending Physician: Evonnie Lenis, MD Primary Care Physician: Cleatus Arlyss RAMAN, MD Admit Date: 06/25/2024  Reason for Consultation/Follow-up: Establishing goals of care  Patient Profile/HPI:  78 y.o. male  with past medical history of stroke with residual right sided deficits, indwelling Foley, A-fib, BPH, hypertension, portal thrombosis, admitted on 06/25/2024 with sepsis due to MRSA bacteremia.  He briefly required Levophed that is now off.  Palliative medicine consulted for goals of care.  ID recommended TEE, however, was unable to be completed due to esophageal stricture. GI recommended EGD however, patient declined to do EGD.    Subjective: Chart reviewed including labs, progress notes, imaging from this and previous encounters.  Discussed case with bedside nurse Dorothyann. She discussed PICC line with patient that will be needed for long term antibiotics. He seemed to be in agreement when she spoke to him.  On eval he is awake and alert.  Tells me he is trying to learn more about the PICC line. I showed him pictures of the PICC and discussed the procedure for insertion. I answered his questions related to care of the PICC line and how it is used. We discussed it would only stay in for the duration of his antibiotic therapy- several weeks- and would be removed at the end. I described the removal procedure.  He asked about the downside of having a PICC line. We discussed that it does carry risk of infection. It can be inconvenient at times.  We discussed alternative would be very high risk and likelihood of recurrence of his MRSA bacteremia and that recommendation  would be hospice care if he did not want to do PICC and antibiotic.  Attempted to further delineate patient's actual overall goals- discussed that it isn't just a question of each procedure and interventions- but does he want to continue life sustaining care as we previously discussed.  [name] is unable to answer this question. He shares that he just doesn't know. He notes that he is going to die eventually anyway. Emotional support provided as we discussed the difficulty of facing one's own mortality. For now [name] wishes to continue current interventions. He is agreeable  to limited interventions such as IV fluids, IV antibiotics. He does not want any further procedures. He is considering PICC line placement and wishes to discuss further with his son.    Review of Systems  Gastrointestinal:  Positive for diarrhea.     Physical Exam Vitals and nursing note reviewed.  Cardiovascular:     Rate and Rhythm: Normal rate.  Pulmonary:     Effort: Pulmonary effort is normal.  Neurological:     Mental Status: He is alert and oriented to person, place, and time.     Comments: R side hemiparesis             Vital Signs: BP (!) 146/86   Pulse 76   Temp (!) 97.4 F (36.3 C) (Oral)   Resp 19   Ht 5' 7.5 (1.715 m)   Wt 77.2 kg   SpO2 95%   BMI 26.26 kg/m  SpO2: SpO2: 95 % O2 Device: O2 Device: Room Air O2 Flow Rate: O2 Flow Rate (L/min): 2 L/min  Intake/output summary:  Intake/Output Summary (Last 24 hours) at 07/04/2024 1219 Last data filed at 07/04/2024 9381 Gross per 24 hour  Intake 433.82 ml  Output 1300 ml  Net -866.18 ml   LBM: Last BM Date : 07/04/24 Baseline Weight: Weight: 78 kg Most recent weight: Weight: 77.2 kg       Palliative Assessment/Data: PPS: 30%      Patient Active Problem List   Diagnosis Date Noted   Severe sepsis (HCC) 06/25/2024   UTI (urinary tract infection) 06/25/2024   PNA (pneumonia) 06/25/2024   AKI (acute kidney injury) 06/25/2024    Essential hypertension 05/12/2024   History of urinary retention 10/30/2023   History of elevated PSA 10/30/2023   Weakness of right side of body 06/05/2023   History of hemorrhagic stroke with residual hemiplegia (HCC) 05/30/2023   Glaucoma 05/30/2023   Former smoker 05/30/2023   Ambulatory dysfunction 05/30/2023   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   Atrial fibrillation (HCC) 09/29/2019   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/18/2019   Sepsis (HCC) 09/18/2019   Degenerative disc disease, cervical 01/23/2019   Skin irritation 10/28/2018   Encounter for diagnostic endoscopy 06/25/2017   Low HDL (under 40) 05/24/2016   BPH (benign prostatic hyperplasia) 12/10/2013   Erectile dysfunction 08/26/2013   GERD (gastroesophageal reflux disease) 06/28/2013   Depression 04/01/2011   Anxiety state 12/15/2007   PEPTIC STRICTURE 12/15/2007   Irritable bowel syndrome 12/23/2006    Palliative Care Assessment & Plan    Assessment/Recommendations/Plan  Continue current interventions, no EGD,  Pt considering placement of PICC and long term antibiotics- wishes to speak to his son I placed call to son and left message  Code Status:   Code Status: Limited: Do not attempt resuscitation (DNR) -DNR-LIMITED -Do Not Intubate/DNI    Prognosis:  Unable to determine  Discharge Planning: To Be Determined  Care plan was discussed with patient  Thank you for allowing the Palliative Medicine Team to assist in the care of this patient.  Total time:  50 minutes Prolonged billing:  Time includes:   Preparing to see the patient (e.g., review of tests) Obtaining and/or reviewing separately obtained history Performing a medically necessary appropriate examination and/or evaluation Counseling and educating the patient/family/caregiver Ordering medications, tests, or procedures Referring and communicating with other health care professionals (when not reported separately) Documenting clinical information in the  electronic or other health record Independently interpreting results (not reported separately) and communicating results to the patient/family/caregiver Care coordination (  not reported separately) Clinical documentation  Cassondra Stain, AGNP-C Palliative Medicine   Please contact Palliative Medicine Team phone at [phone] for questions and concerns.

## 2024-07-04 NOTE — Progress Notes (Signed)
 [facility] Liaison Note:   Previously notified by IP CM Hoy of patient/family request for [facility] Palliative services at [facility] after discharge.  Liaisons continue to follow for discharge.   Please call with any hospice or outpatient palliative care related questions.   Thank you for the opportunity to participate in this patient's care.   Eleanor Nail, LPN [facility] Liaison [phone]

## 2024-07-04 NOTE — Plan of Care (Signed)
  Problem: Education: Goal: Knowledge of General Education information will improve Description: Including pain rating scale, medication(s)/side effects and non-pharmacologic comfort measures Outcome: Progressing   Problem: Health Behavior/Discharge Planning: Goal: Ability to manage health-related needs will improve Outcome: Progressing   Problem: Clinical Measurements: Goal: Ability to maintain clinical measurements within normal limits will improve Outcome: Progressing   Problem: Activity: Goal: Risk for activity intolerance will decrease Outcome: Progressing   Problem: Nutrition: Goal: Adequate nutrition will be maintained Outcome: Progressing   Problem: Coping: Goal: Level of anxiety will decrease Outcome: Progressing   Problem: Pain Managment: Goal: General experience of comfort will improve and/or be controlled Outcome: Progressing   Problem: Safety: Goal: Ability to remain free from injury will improve Outcome: Progressing

## 2024-07-04 NOTE — Progress Notes (Signed)
 PHARMACY CONSULT NOTE FOR:  OUTPATIENT  PARENTERAL ANTIBIOTIC THERAPY (OPAT)  Indication: MRSA Bacteremia Regimen: Daptomycin 600 mg Q24H End date: 08/08/24  Noted plans for discharge to SNF. Administration instructions per SNF protocol.  IV antibiotic discharge orders are pended. To discharging provider:  please sign these orders via discharge navigator,  Select New Orders & click on the button choice - Manage This Unsigned Work.     Thank you for allowing pharmacy to be a part of this patient's care.  Prentice DOROTHA Favors, PharmD PGY1 Health-System Pharmacy Administration and Leadership Resident [facility]  07/04/2024 2:47 PM

## 2024-07-04 NOTE — Progress Notes (Signed)
   07/04/24 1227  Provider Notification  Provider Name/Title Dr. Evonnie  Date Provider Notified 07/04/24  Time Provider Notified 1227  Method of Notification Page  Notification Reason Critical Result  Test performed and critical result PTT greater than 200  Date Critical Result Received 07/04/24  Time Critical Result Received 1227  Provider response Other (Comment) (awaiting orders)

## 2024-07-04 NOTE — Progress Notes (Signed)
 PHARMACY - ANTICOAGULATION CONSULT NOTE  Pharmacy Consult for heparin  Indication: atrial fibrillation  Allergies  Allergen Reactions   Shellfish Allergy Shortness Of Breath, Rash and Other (See Comments)    GI upset Difficulty breathing  Shrimp and crab   Celexa  [Citalopram ] Other (See Comments)    Aggression    Levsin [Hyoscyamine] Other (See Comments)    Urinary retention   Librax [Chlordiazepoxide-Clidinium] Other (See Comments)    Urinary retention   Ak-Mycin [Erythromycin] Other (See Comments)    GI upset   Mobic  [Meloxicam ] Other (See Comments)    GI upset   Penicillins Itching, Rash and Dermatitis    Received Unasyn  earlier without acute reaction    Patient Measurements: Height: 5' 7.5 (171.5 cm) Weight: 77.2 kg (170 lb 3.1 oz) IBW/kg (Calculated) : 67.25 HEPARIN  DW (KG): 76.4  Vital Signs: Temp: 98.2 F (36.8 C) (11/13 1439) Temp Source: Oral (11/13 1439) BP: 121/76 (11/13 1439) Pulse Rate: 64 (11/13 1439)  Labs: Recent Labs    07/02/24 0420 07/02/24 0420 07/03/24 0358 07/03/24 1309 07/04/24 0849 07/04/24 1416  HGB 15.1  --  15.8  --  16.3  --   HCT 47.5  --  49.1  --  51.4  --   PLT 464*  --  488*  --  476*  --   APTT  --    < > 61* 79* >200* 83*  HEPARINUNFRC  --   --  >1.10*  --  0.97*  --   CREATININE 0.76  --  0.69  --  0.79  --   CKTOTAL  --   --  <10*  --   --   --    < > = values in this interval not displayed.    Estimated Creatinine Clearance: 71.3 mL/min (by C-G formula based on SCr of 0.79 mg/dL).   Medical History: Past Medical History:  Diagnosis Date   Actinic keratosis 04/02/2013   L sup scapula - bx proven   Actinic keratosis 04/02/2013   R med ant deltoid - bx proven   Allergy    Anal fissure    Anxiety    Basal cell carcinoma    back - treated in the past    Basal cell carcinoma 05/02/2009   left supratip, Mohs   Cancer (HCC)    basal cell removed x4   Cataract    bil eyes   Depression    Diverticulosis of  colon    GERD (gastroesophageal reflux disease)    esophageal narrowing   Glaucoma    Hiatal hernia    IBS (irritable bowel syndrome)    Insomnia    Internal hemorrhoids     Medications:  Medications Prior to Admission  Medication Sig Dispense Refill Last Dose/Taking   Baclofen  5 MG TABS Take 1 tablet by mouth 3 (three) times daily.   06/24/2024 Evening   cetirizine (ZYRTEC) 10 MG tablet Take 10 mg by mouth daily.   06/24/2024 Morning   ELIQUIS  2.5 MG TABS tablet Take 2.5 mg by mouth 2 (two) times daily. 0800, 2000   06/24/2024 at  8:00 PM   [EXPIRED] guaiFENesin (MUCINEX) 600 MG 12 hr tablet Take 600 mg by mouth 2 (two) times daily. For 5 days   06/24/2024 Evening   lactose free nutrition (BOOST) LIQD Take 240 mLs by mouth 3 (three) times daily between meals.   06/24/2024 Evening   latanoprost  (XALATAN ) 0.005 % ophthalmic solution Place 1 drop into both eyes at bedtime.  06/24/2024 Bedtime   melatonin 3 MG TABS tablet Take 1 tablet (3 mg total) by mouth at bedtime as needed. (Patient taking differently: Take 3 mg by mouth at bedtime as needed (insomnia).)   Unknown   methocarbamol  (ROBAXIN ) 500 MG tablet Take 1 tablet (500 mg total) by mouth every 8 (eight) hours as needed for muscle spasms. 90 tablet 0 Unknown   nystatin  ointment (MYCOSTATIN ) Apply 1 Application topically every 12 (twelve) hours as needed (chronic fungal rash). Apply to glans penis/scrotum   Unknown   polyethylene glycol (MIRALAX  / GLYCOLAX ) 17 g packet Take 17 g by mouth 2 (two) times daily.   06/24/2024 Evening   pregabalin  (LYRICA ) 50 MG capsule Take 50 mg by mouth 2 (two) times daily.   06/24/2024 Evening   PROCTOZONE -HC 2.5 % rectal cream Place 1 Application rectally every 6 (six) hours as needed for hemorrhoids (after bowel movements).   Unknown   senna-docusate (SENOKOT-S) 8.6-50 MG tablet Take 1 tablet by mouth daily.   06/24/2024 Morning   sertraline (ZOLOFT) 25 MG tablet Take 25 mg by mouth daily.   06/24/2024 Morning    tamsulosin  (FLOMAX ) 0.4 MG CAPS capsule Take 1 capsule (0.4 mg total) by mouth daily after supper. (Patient taking differently: Take 0.4 mg by mouth every evening.)   06/24/2024 Evening   timolol  (TIMOPTIC ) 0.25 % ophthalmic solution Place 1 drop into both eyes daily.   06/24/2024 Morning    Assessment: Pharmacy consulted to dose heparin  in patient with atrial fibrillation.  He is on Eliquis  with last dose 11/11 @ 0835.  Patient is now needing endoscopy so holding Eliquis  and starting heparin  infusion. Will need to dose based on aPTT until correlation with heparin  levels.   APTT 83- therapeutic, previous level >200 was incorrect CBC WNL HL 0.97- still elevated due to Eliquis   Goal of Therapy:  Heparin  level 0.3-0.7 units/ml aPTT 66-102 seconds Monitor platelets by anticoagulation protocol: Yes   Plan:  Continue heparin  infusion at 1200 units/hr Check aPTT level daily while on heparin  Continue to monitor H&H and platelets  Elspeth Sour, PharmD Clinical Pharmacist 07/04/2024 3:17 PM

## 2024-07-04 NOTE — Progress Notes (Signed)
 [facility]                                                                                        Patient Identification: Patient Name: Mason [name] MRN: [id number] Admit Date: 06/25/2024  1:35 PM Today's Date: 07/04/2024 Reason for follow up: MRSA bacteremia   Principal Problem:   Severe sepsis (HCC) Active Problems:   BPH (benign prostatic hyperplasia)   Encounter for diagnostic endoscopy   Atrial fibrillation (HCC)   History of hemorrhagic stroke with residual hemiplegia (HCC)   Essential hypertension   UTI (urinary tract infection)   PNA (pneumonia)   AKI (acute kidney injury)   I connected with the patient by a video enabled telemedicine application and verified that I am speaking with the correct person using two identifiers.  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  He also had reported headache, intermittent right eye pain for 2 days but resolved during ED presentation.  Foley's catheter was exchanged in the day of ED arrival   # MRSA bacteremia, unable to rule out endocarditis -Unclear cause questionable UTI -No note of wounds, cellulitis, hardware or joint or back pain - Patient declining EGD as well as TEE for ruling out endocarditis plan to treat for presumptive endocarditis - Medication side effects, interactions reviewed  # Leukocytosis - Stable at 13.1  # BPH/urinary retention s/p indwelling Foley's -Foley is exchanged in the  ED  Recommendations  - Continue daptomycin through 08/08/24.  Sensitivities reviewed.  MRSA sensitive to daptomycin.  - Monitor CBC, CMP and CPK - Needs PICC, ordered  - Contact precautions D/W primary team ID will sign off  OPAT Diagnosis: Bacteremia, cannot rule out endocarditis  Culture Result: MRSA  Allergies  Allergen Reactions   Shellfish Allergy Shortness Of Breath, Rash and Other (See Comments)    GI upset Difficulty breathing  Shrimp and crab   Celexa  [Citalopram ] Other (See Comments)    Aggression    Levsin [Hyoscyamine] Other (See Comments)    Urinary retention   Librax [Chlordiazepoxide-Clidinium] Other (See Comments)    Urinary retention   Ak-Mycin [Erythromycin] Other (See Comments)    GI upset   Mobic  [Meloxicam ] Other (See Comments)    GI upset   Penicillins Itching, Rash and Dermatitis    Received Unasyn  earlier without acute reaction    OPAT Orders Discharge antibiotics to be given via PICC line Discharge antibiotics: Daptomycin per pharmacy  Per pharmacy protocol  Duration: 6 weeks  End Date: 08/08/24  [facility] Care Per Protocol:  Home health RN for IV administration and teaching; PICC line care and labs.    Labs weekly while on IV antibiotics: X__ CBC with differential __ BMP X__ CMP __ CRP __ ESR __ Vancomycin  trough X__ CK  X__ Please pull PIC at completion of IV antibiotics __ Please leave PIC in place until doctor has seen patient or been notified  Fax weekly labs to [phone]  Clinic Follow Up Appt: 11/25 @9 : 45 am   Rest of the management as per the primary team. Please call with questions or concerns.  Thank you for the consult  __________________________________________________________________________________________________________ Subjective:  Patient seen via video.  RN at bedside.  Discussed plan to continue IV antibiotics through PICC, he is agreeable.    ROS: All systems reviewed with pertinent positive and  negative as listed above  Scheduled Meds:  baclofen   5 mg Oral TID   Chlorhexidine  Gluconate Cloth  6 each Topical Daily   diltiazem   120 mg Oral Daily   latanoprost   1 drop Both Eyes QHS   mupirocin ointment   Nasal BID   sertraline  25 mg Oral Daily   tamsulosin   0.4 mg Oral QPM   timolol   1 drop Both Eyes Daily   Continuous Infusions:  DAPTOmycin Stopped (07/03/24 1812)   heparin  1,200 Units/hr (07/04/24 0439)   PRN Meds:.acetaminophen  **OR** acetaminophen , guaiFENesin-dextromethorphan, ondansetron  **OR** ondansetron  (ZOFRAN ) IV, mouth rinse, polyethylene glycol, traZODone   Vitals BP (!) 146/86   Pulse 76   Temp (!) 97.4 F (36.3 C) (Oral)   Resp 19   Ht 5' 7.5 (1.715 m)   Wt 77.2 kg   SpO2 95%   BMI 26.26 kg/m '  Physical Exam In the bed and appears comfortable Normal respiratory effort Able to speak in full sentences  Pertinent Microbiology Results for orders placed or performed during the hospital encounter of 06/25/24  Blood Culture (routine x 2)     Status: None   Collection Time: 06/25/24  2:25 PM   Specimen: BLOOD  Result Value Ref Range Status   Specimen Description BLOOD RIGHT ARM  Final   Special Requests   Final    BOTTLES DRAWN AEROBIC AND ANAEROBIC Blood Culture results may not be optimal due to an inadequate volume of blood received in culture bottles   Culture   Final    NO GROWTH 5 DAYS Performed at [facility], [address], [place], [place] [address]    Report Status 06/30/2024 FINAL  Final  Blood Culture (routine x 2)     Status: Abnormal (Preliminary result)   Collection Time: 06/25/24  2:35 PM   Specimen: BLOOD  Result Value Ref Range Status   Specimen Description   Final    BLOOD LEFT ANTECUBITAL Performed at [facility], [address], [place], [place] [address]    Special Requests   Final    BOTTLES DRAWN AEROBIC AND ANAEROBIC Blood Culture results may not be optimal due to an inadequate volume of blood received in  culture bottles Performed at [facility], [address] [place], [place] [address]    Culture  Setup Time   Final    BOTTLES DRAWN AEROBIC ONLY GRAM POSITIVE COCCI Gram Stain Report Called to,Read Back By and Verified With: C. ROGERS RN 06/26/24 @1203  BY J. WHITE CRITICAL RESULT CALLED TO, READ BACK BY AND VERIFIED WITH: RN CSABRA AHLE [phone] @ ELIYA.EDU FH    Culture (A)  Final    METHICILLIN RESISTANT STAPHYLOCOCCUS AUREUS Sent to [facility] for further susceptibility testing. Performed at [facility], [address], [place], [place] [address]    Report Status PENDING  Incomplete   Organism ID, Bacteria METHICILLIN RESISTANT STAPHYLOCOCCUS AUREUS  Final  Susceptibility   Methicillin resistant staphylococcus aureus - MIC*    CIPROFLOXACIN  4 RESISTANT Resistant     ERYTHROMYCIN >=8 RESISTANT Resistant     GENTAMICIN <=0.5 SENSITIVE Sensitive     OXACILLIN >=4 RESISTANT Resistant     TETRACYCLINE <=1 SENSITIVE Sensitive     VANCOMYCIN  1 SENSITIVE Sensitive     TRIMETH /SULFA  <=10 SENSITIVE Sensitive     CLINDAMYCIN <=0.25 SENSITIVE Sensitive     RIFAMPIN <=0.5 SENSITIVE Sensitive     Inducible Clindamycin NEGATIVE Sensitive     LINEZOLID 2 SENSITIVE Sensitive     * METHICILLIN RESISTANT STAPHYLOCOCCUS AUREUS  Blood Culture ID Panel (Reflexed)     Status: Abnormal   Collection Time: 06/25/24  2:35 PM  Result Value Ref Range Status   Enterococcus faecalis NOT DETECTED NOT DETECTED Final   Enterococcus Faecium NOT DETECTED NOT DETECTED Final   Listeria monocytogenes NOT DETECTED NOT DETECTED Final   Staphylococcus species DETECTED (A) NOT DETECTED Final    Comment: CRITICAL RESULT CALLED TO, READ BACK BY AND VERIFIED WITH: RN C. COOK [phone] @ [phone] FH    Staphylococcus aureus (BCID) DETECTED (A) NOT DETECTED Final    Comment: Methicillin (oxacillin)-resistant Staphylococcus aureus (MRSA). MRSA is predictably resistant to beta-lactam antibiotics (except ceftaroline). Preferred  therapy is vancomycin  unless clinically contraindicated. Patient requires contact precautions if  hospitalized. CRITICAL RESULT CALLED TO, READ BACK BY AND VERIFIED WITH: RN C. COOK [phone] @ 1822 FH    Staphylococcus epidermidis NOT DETECTED NOT DETECTED Final   Staphylococcus lugdunensis NOT DETECTED NOT DETECTED Final   Streptococcus species NOT DETECTED NOT DETECTED Final   Streptococcus agalactiae NOT DETECTED NOT DETECTED Final   Streptococcus pneumoniae NOT DETECTED NOT DETECTED Final   Streptococcus pyogenes NOT DETECTED NOT DETECTED Final   A.calcoaceticus-baumannii NOT DETECTED NOT DETECTED Final   Bacteroides fragilis NOT DETECTED NOT DETECTED Final   Enterobacterales NOT DETECTED NOT DETECTED Final   Enterobacter cloacae complex NOT DETECTED NOT DETECTED Final   Escherichia coli NOT DETECTED NOT DETECTED Final   Klebsiella aerogenes NOT DETECTED NOT DETECTED Final   Klebsiella oxytoca NOT DETECTED NOT DETECTED Final   Klebsiella pneumoniae NOT DETECTED NOT DETECTED Final   Proteus species NOT DETECTED NOT DETECTED Final   Salmonella species NOT DETECTED NOT DETECTED Final   Serratia marcescens NOT DETECTED NOT DETECTED Final   Haemophilus influenzae NOT DETECTED NOT DETECTED Final   Neisseria meningitidis NOT DETECTED NOT DETECTED Final   Pseudomonas aeruginosa NOT DETECTED NOT DETECTED Final   Stenotrophomonas maltophilia NOT DETECTED NOT DETECTED Final   Candida albicans NOT DETECTED NOT DETECTED Final   Candida auris NOT DETECTED NOT DETECTED Final   Candida glabrata NOT DETECTED NOT DETECTED Final   Candida krusei NOT DETECTED NOT DETECTED Final   Candida parapsilosis NOT DETECTED NOT DETECTED Final   Candida tropicalis NOT DETECTED NOT DETECTED Final   Cryptococcus neoformans/gattii NOT DETECTED NOT DETECTED Final   Meth resistant mecA/C and MREJ DETECTED (A) NOT DETECTED Final    Comment: CRITICAL RESULT CALLED TO, READ BACK BY AND VERIFIED WITH: RN KYM AHLE [phone]  @ [phone] FH Performed at [facility], [address], [place], [place] [address]   MIC (1 Drug)-     Status: Abnormal   Collection Time: 06/25/24  2:35 PM  Result Value Ref Range Status   Min Inhibitory Conc (1 Drug) Final report (A)  Corrected    Comment: (NOTE) Performed At: [facility] [place] [address] [place], [place]  [id number] Jennette Shorter MD Ey:[phone] CORRECTED ON 11/11 AT 1436: PREVIOUSLY REPORTED AS Preliminary report    Source [id number] MRSA DAPTOMYCIN BLOOD  Final    Comment: Performed at [facility], [address], [place], [place] [address]  MIC Result     Status: Abnormal   Collection Time: 06/25/24  2:35 PM  Result Value Ref Range Status   Result 1 (MIC) Comment (A)  Final    Comment: (NOTE) Methicillin - resistant Staphylococcus aureus Identification performed by account, not confirmed by this laboratory. Testing performed by gradient elution. DAPTOMYCIN  0.75 ug/mL  SUSCEPTIBLE Performed At: [facility] [address] [place], [place] [id number] Jennette Shorter MD Ey:[phone]   Urine Culture     Status: Abnormal   Collection Time: 06/25/24  4:01 PM   Specimen: Urine, Random  Result Value Ref Range Status   Specimen Description   Final    URINE, RANDOM Performed at [facility], [address], [place], [place] [address]    Special Requests   Final    NONE Reflexed from [id number] Performed at [facility], [address] [place], [place] [address]    Culture MULTIPLE SPECIES PRESENT, SUGGEST RECOLLECTION (A)  Final   Report Status 06/27/2024 FINAL  Final  MRSA Next Gen by PCR, Nasal     Status: Abnormal   Collection Time: 06/26/24  2:41 AM   Specimen: Nasal Mucosa; Nasal Swab  Result Value Ref Range Status   MRSA by PCR Next Gen DETECTED (A) NOT DETECTED Final    Comment: RESULT CALLED TO, READ BACK BY AND VERIFIED WITH: B. SUPER [phone], VIRAY,J (NOTE) The GeneXpert MRSA Assay (FDA approved for NASAL specimens  only), is one component of a comprehensive MRSA colonization surveillance program. It is not intended to diagnose MRSA infection nor to guide or monitor treatment for MRSA infections. Test performance is not FDA approved in patients less than 54 years old. Performed at [facility], [address] [place], [place] [address]   Culture, blood (Routine X 2) w Reflex to ID Panel     Status: None   Collection Time: 06/27/24  8:56 AM   Specimen: BLOOD  Result Value Ref Range Status   Specimen Description BLOOD BLOOD RIGHT HAND  Final   Special Requests   Final    BOTTLES DRAWN AEROBIC AND ANAEROBIC Blood Culture adequate volume   Culture   Final    NO GROWTH 5 DAYS Performed at [facility], [address], [place], [place] [address]    Report Status 07/02/2024 FINAL  Final  Culture, blood (Routine X 2) w Reflex to ID Panel     Status: None   Collection Time: 06/27/24  9:03 AM   Specimen: BLOOD  Result Value Ref Range Status   Specimen Description BLOOD RIGHT ANTECUBITAL  Final   Special Requests   Final    BOTTLES DRAWN AEROBIC AND ANAEROBIC Blood Culture adequate volume   Culture   Final    NO GROWTH 5 DAYS Performed at [facility], [address] [place], [place] [address]    Report Status 07/02/2024 FINAL  Final   Pertinent Lab seen by me:    Latest Ref Rng & Units 07/04/2024    8:49 AM 07/03/2024    3:58 AM 07/02/2024    4:20 AM  CBC  WBC 4.0 - 10.5 K/uL 13.1  12.5  13.9   Hemoglobin 13.0 - 17.0 g/dL 83.6  84.1  84.8   Hematocrit 39.0 -  52.0 % 51.4  49.1  47.5   Platelets 150 - 400 K/uL 476  488  464       Latest Ref Rng & Units 07/04/2024    8:49 AM 07/03/2024    3:58 AM 07/02/2024    4:20 AM  CMP  Glucose 70 - 99 mg/dL 887   86   BUN 8 - 23 mg/dL 14   13   Creatinine 9.38 - 1.24 mg/dL 9.20  9.30  9.23   Sodium 135 - 145 mmol/L 137   139   Potassium 3.5 - 5.1 mmol/L 4.1   4.0   Chloride 98 - 111 mmol/L 101   105   CO2 22 - 32 mmol/L 28   27   Calcium 8.9 -  10.3 mg/dL 8.5   8.3      Pertinent Imagings/Other Imagings Plain films and CT images have been personally visualized and interpreted; radiology reports have been reviewed. Decision making incorporated into the Impression / Recommendations.  ECHOCARDIOGRAM COMPLETE Result Date: 06/27/2024    ECHOCARDIOGRAM REPORT   Patient Name:   DAYTONA RETANA [facility] Date of Exam: 06/27/2024 Medical Rec #:  [id number]         Height:       67.5 in Accession #:    [id number]        Weight:       168.4 lb Date of Birth:  Jan 03, 1946        BSA:          1.890 m Patient Age:    78 years          BP:           115/71 mmHg Patient Gender: M                 HR:           62 bpm. Exam Location:  [facility] Procedure: 2D Echo, Cardiac Doppler, Color Doppler and Intracardiac            Opacification Agent (Both Spectral and Color Flow Doppler were            utilized during procedure). Indications:    Bacteremia  History:        Patient has prior history of Echocardiogram examinations, most                 recent 04/26/2023. Abnormal ECG, Stroke, Arrythmias:Atrial                 Fibrillation, Signs/Symptoms:Altered Mental Status; Risk                 Factors:Hypertension and Former Smoker. Cancer.  Sonographer:    Ellouise Mose RDCS Referring Phys: [phone] CARLOS MADERA IMPRESSIONS  1. Left ventricular ejection fraction, by estimation, is 60 to 65%. The left ventricle has normal function. Left ventricular endocardial border not optimally defined to evaluate regional wall motion. There is mild left ventricular hypertrophy. Left ventricular diastolic parameters are consistent with Grade I diastolic dysfunction (impaired relaxation).  2. Right ventricular systolic function is normal. The right ventricular size is mildly enlarged. There is normal pulmonary artery systolic pressure. The estimated right ventricular systolic pressure is 24.0 mmHg.  3. The mitral valve is normal in structure. No evidence of mitral valve regurgitation. No evidence of  mitral stenosis.  4. The aortic valve was not well visualized. Aortic valve regurgitation is not visualized. No aortic stenosis is present.  5. The inferior vena cava is  normal in size with <50% respiratory variability, suggesting right atrial pressure of 8 mmHg. FINDINGS  Left Ventricle: Left ventricular ejection fraction, by estimation, is 60 to 65%. The left ventricle has normal function. Left ventricular endocardial border not optimally defined to evaluate regional wall motion. Definity contrast agent was given IV to delineate the left ventricular endocardial borders. The left ventricular internal cavity size was normal in size. There is mild left ventricular hypertrophy. Left ventricular diastolic parameters are consistent with Grade I diastolic dysfunction (impaired relaxation). Right Ventricle: The right ventricular size is mildly enlarged. Right vetricular wall thickness was not well visualized. Right ventricular systolic function is normal. There is normal pulmonary artery systolic pressure. The tricuspid regurgitant velocity  is 2.00 m/s, and with an assumed right atrial pressure of 8 mmHg, the estimated right ventricular systolic pressure is 24.0 mmHg. Left Atrium: Left atrial size was normal in size. Right Atrium: Right atrial size was normal in size. Pericardium: There is no evidence of pericardial effusion. Mitral Valve: The mitral valve is normal in structure. No evidence of mitral valve regurgitation. No evidence of mitral valve stenosis. Tricuspid Valve: The tricuspid valve is normal in structure. Tricuspid valve regurgitation is trivial. No evidence of tricuspid stenosis. Aortic Valve: The aortic valve was not well visualized. Aortic valve regurgitation is not visualized. No aortic stenosis is present. Aortic valve mean gradient measures 3.9 mmHg. Aortic valve peak gradient measures 8.8 mmHg. Aortic valve area, by VTI measures 2.36 cm. Pulmonic Valve: The pulmonic valve was not well visualized.  Pulmonic valve regurgitation is not visualized. No evidence of pulmonic stenosis. Aorta: The aortic root and ascending aorta are structurally normal, with no evidence of dilitation. Venous: The inferior vena cava is normal in size with less than 50% respiratory variability, suggesting right atrial pressure of 8 mmHg. IAS/Shunts: No atrial level shunt detected by color flow Doppler.  LEFT VENTRICLE PLAX 2D LVIDd:         4.10 cm      Diastology LVIDs:         2.70 cm      LV e' medial:    9.03 cm/s LV PW:         1.10 cm      LV E/e' medial:  7.5 LV IVS:        1.10 cm      LV e' lateral:   11.10 cm/s LVOT diam:     2.20 cm      LV E/e' lateral: 6.1 LV SV:         62 LV SV Index:   33 LVOT Area:     3.80 cm  LV Volumes (MOD) LV vol d, MOD A2C: 107.0 ml LV vol d, MOD A4C: 96.6 ml LV vol s, MOD A2C: 55.5 ml LV vol s, MOD A4C: 45.8 ml LV SV MOD A2C:     51.5 ml LV SV MOD A4C:     96.6 ml LV SV MOD BP:      52.1 ml RIGHT VENTRICLE             IVC RV S prime:     16.20 cm/s  IVC diam: 2.00 cm TAPSE (M-mode): 2.3 cm LEFT ATRIUM             Index        RIGHT ATRIUM           Index LA diam:        3.50 cm 1.85 cm/m  RA Area:     17.40 cm LA Vol (A2C):   33.2 ml 17.56 ml/m  RA Volume:   46.70 ml  24.71 ml/m LA Vol (A4C):   27.3 ml 14.44 ml/m LA Biplane Vol: 33.4 ml 17.67 ml/m  AORTIC VALVE AV Area (Vmax):    2.06 cm AV Area (Vmean):   2.18 cm AV Area (VTI):     2.36 cm AV Vmax:           148.50 cm/s AV Vmean:          90.400 cm/s AV VTI:            0.261 m AV Peak Grad:      8.8 mmHg AV Mean Grad:      3.9 mmHg LVOT Vmax:         80.45 cm/s LVOT Vmean:        51.900 cm/s LVOT VTI:          0.162 m LVOT/AV VTI ratio: 0.62  AORTA Ao Root diam: 3.00 cm Ao Asc diam:  3.60 cm MITRAL VALVE               TRICUSPID VALVE MV Area (PHT): 3.53 cm    TR Peak grad:   16.0 mmHg MV Decel Time: 215 msec    TR Vmax:        200.00 cm/s MV E velocity: 67.60 cm/s MV A velocity: 84.35 cm/s  SHUNTS MV E/A ratio:  0.80        Systemic  VTI:  0.16 m                            Systemic Diam: 2.20 cm Dorn Ross MD Electronically signed by Dorn Ross MD Signature Date/Time: 06/27/2024/1:53:32 PM    Final    CT CHEST ABDOMEN PELVIS WO CONTRAST Result Date: 06/25/2024 EXAM: CT CHEST, ABDOMEN AND PELVIS WITHOUT CONTRAST 06/25/2024 06:38:36 PM TECHNIQUE: CT of the chest, abdomen and pelvis was performed without the administration of intravenous contrast. Multiplanar reformatted images are provided for review. Automated exposure control, iterative reconstruction, and/or weight based adjustment of the mA/kV was utilized to reduce the radiation dose to as low as reasonably achievable. COMPARISON: Chest radiograph 06/25/2024, CT chest 04/26/2023, CT abdomen and pelvis 10/09/2022. CLINICAL HISTORY: Sepsis. FINDINGS: CHEST: MEDIASTINUM AND LYMPH NODES: Heart and pericardium are unremarkable. The central airways are clear. No mediastinal, hilar or axillary lymphadenopathy. LUNGS AND PLEURA: Layering debris in the trachea. Mucous plugging in the lower lobes. Lower lobe bronchial wall thickening. Scarring in the lower lobes. No pleural effusion or pneumothorax. ABDOMEN AND PELVIS: LIVER: The liver is unremarkable. GALLBLADDER AND BILE DUCTS: Gallbladder is unremarkable. No biliary ductal dilatation. SPLEEN: No acute abnormality. PANCREAS: No acute abnormality. ADRENAL GLANDS: No acute abnormality. KIDNEYS, URETERS AND BLADDER: Cord catheter and gas in the decompressed bladder. No stones in the kidneys or ureters. No hydronephrosis. No perinephric or periureteral stranding. GI AND BOWEL: Stomach demonstrates no acute abnormality. Extensive sigmoid colon diverticulosis. Trace stranding about the proximal sigmoid colon (cervical series 5 image 517). There is no bowel obstruction. REPRODUCTIVE ORGANS: Enlarged prostate. PERITONEUM AND RETROPERITONEUM: No ascites. No free air. VASCULATURE: Aorta is normal in caliber. ABDOMINAL AND PELVIS LYMPH NODES: No  lymphadenopathy. BONES AND SOFT TISSUES: No acute osseous abnormality. No focal soft tissue abnormality. IMPRESSION: 1. Layering debris in the trachea with mucous plugging and lower lobe bronchial wall thickening, reflecting airway inflammation/infection. 2. Extensive left  colon diverticulosis with mild sigmoid diverticulitis. Electronically signed by: Norman Gatlin MD 06/25/2024 06:54 PM EST RP Workstation: [id number]   CT Head Wo Contrast Result Date: 06/25/2024 CLINICAL DATA:  Headache x2 weeks. EXAM: CT HEAD WITHOUT CONTRAST TECHNIQUE: Contiguous axial images were obtained from the base of the skull through the vertex without intravenous contrast. RADIATION DOSE REDUCTION: This exam was performed according to the departmental dose-optimization program which includes automated exposure control, adjustment of the mA and/or kV according to patient size and/or use of iterative reconstruction technique. COMPARISON:  August 31, 2023 FINDINGS: Brain: There is generalized cerebral atrophy with widening of the extra-axial spaces and ventricular dilatation. There are areas of decreased attenuation within the white matter tracts of the supratentorial brain, consistent with microvascular disease changes. 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 Vascular: No hyperdense vessel or unexpected calcification. Skull: Normal. Negative for fracture or focal lesion. Sinuses/Orbits: There is mild to moderate severity bilateral ethmoid sinus mucosal thickening. Other: None. IMPRESSION: 1. 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 2. 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 3. No acute intracranial abnormality. Electronically Signed   By: Suzen Dials M.D.   On: 06/25/2024 18:53   DG Chest Port 1 View Result Date: 06/25/2024 EXAM: 1 VIEW(S) XRAY OF  THE CHEST 06/25/2024 03:04:00 PM COMPARISON: Chest x-ray 01/10/2024. Chest CT 01/24/2024. CLINICAL HISTORY: Questionable sepsis - evaluate for abnormality. FINDINGS: LUNGS AND PLEURA: Small nodular density seen on previous x-ray likely overlies the first rib end on today's study. No pulmonary edema. No pleural effusion. No pneumothorax. HEART AND MEDIASTINUM: No acute abnormality of the cardiac and mediastinal silhouettes. BONES AND SOFT TISSUES: No acute osseous abnormality. IMPRESSION: 1. No acute findings. 2. Small nodular density seen on previous x-ray likely overlies the first rib end on today's study. Follow-up chest CT should be considered. Electronically signed by: Greig Pique MD 06/25/2024 03:34 PM EST RP Workstation: [id number]    I discussed the limitations of evaluation and management by telemedicine. The patient expressed understanding and agreed to proceed.  I discussed the assessment and treatment plan with the patient. The patient was provided an opportunity to ask questions and all were answered. The patient agreed with the plan and demonstrated an understanding of the instructions.   The patient was advised to call back or seek an in-person evaluation if the symptoms worsen or if the condition fails to improve as anticipated.  I spent 50 minutes involved in face-to-face and non-face-to-face activities for this patient on the day of the visit. Professional time spent includes the following activities: Preparing to see the patient (review of tests), Obtaining and reviewing separately obtained history (hospitalist progress note), Performing a medically appropriate examination and evaluation,  Ordering medications/labs, referring and communicating with other health care professionals, Documenting clinical information in the EMR, Independently interpreting results (not separately reported), Communicating results to the patient, Counseling and educating the patient and Care coordination (not  separately reported).  Electronically signed by:   Plan d/w requesting provider as well as ID pharm D  Of note, portions of this note may have been created with voice recognition software. While this note has been edited for accuracy, occasional wrong-word or 'sound-a-like' substitutions may have occurred due to the inherent limitations of voice recognition software.   Annalee Orem, MD Infectious Disease Physician [facility] Pager: [phone]

## 2024-07-04 NOTE — Plan of Care (Signed)
  Problem: Clinical Measurements: Goal: Ability to maintain clinical measurements within normal limits will improve Outcome: Progressing   Problem: Elimination: Goal: Will not experience complications related to urinary retention Outcome: Progressing   Problem: Pain Managment: Goal: General experience of comfort will improve and/or be controlled Outcome: Progressing   Problem: Nutrition: Goal: Adequate nutrition will be maintained Outcome: Not Progressing

## 2024-07-04 NOTE — Progress Notes (Signed)
   07/04/24 1455  Spiritual Encounters  Type of Visit Follow up  Care provided to: Patient  Referral source Chaplain assessment  Reason for visit Routine spiritual support  OnCall Visit No   Reason for Visit: Chaplain responding to follow up with Pt I spoke with yesterday in the context of Goals of Care meeting.  Description of Visit: Upon entering the room I found [place] in the bed no support person was present.  We discussed and reviewed the new goals of care he set with the palliative NP earlier today.  He presents as very comfortable with his decision and credits the advice of his son for the change in plan.   No further Spiritual Care interventions planned at this time. Spiritual Care remains available for this Pt by request.  Maude Roll, MDiv Chaplain, [facility] [email or web address] [phone]    Maude Roll, MDiv  Chaplain, [facility] [email or web address] [email or web address] [phone]

## 2024-07-05 LAB — CREATININE, SERUM
Creatinine, Ser: 0.81 mg/dL (ref 0.61–1.24)
GFR, Estimated: >60 mL/min (ref >60)

## 2024-07-05 LAB — CBC
HCT: 47.7 % (ref 39.0–52.0)
Hemoglobin: 15.1 g/dL (ref 13.0–17.0)
MCH: 26.1 pg (ref 26.0–34.0)
MCHC: 31.7 g/dL (ref 30.0–36.0)
MCV: 82.5 fL (ref 80.0–100.0)
Platelets: 494 K/uL — ABNORMAL HIGH (ref 150–400)
RBC: 5.78 MIL/uL (ref 4.22–5.81)
RDW: 21 % — ABNORMAL HIGH (ref 11.5–15.5)
WBC: 13.2 K/uL — ABNORMAL HIGH (ref 4.0–10.5)
nRBC: 0 % (ref 0.0–0.2)

## 2024-07-05 LAB — HEPARIN LEVEL (UNFRACTIONATED): Heparin Unfractionated: 0.46 [IU]/mL (ref 0.30–0.70)

## 2024-07-05 LAB — APTT: aPTT: 111 s — ABNORMAL HIGH (ref 24–36)

## 2024-07-05 MED ORDER — APIXABAN 2.5 MG PO TABS
2.5000 mg | ORAL_TABLET | Freq: Two times a day (BID) | ORAL | Status: DC
  Administered 2024-07-05: 2.5 mg via ORAL
  Filled 2024-07-05: qty 1

## 2024-07-05 MED ORDER — DAPTOMYCIN IV (FOR PTA / DISCHARGE USE ONLY): 600.0000 mg | INTRAVENOUS | 0 refills | Status: AC

## 2024-07-05 MED ORDER — DILTIAZEM HCL ER COATED BEADS 120 MG PO CP24: 120.0000 mg | ORAL_CAPSULE | Freq: Every day | ORAL | Status: AC

## 2024-07-05 NOTE — Care Management Important Message (Signed)
 Important Message  Patient Details  Name: Mason [name] MRN: [id number] Date of Birth: 03/02/1946   Important Message Given:  Yes - Medicare IM     [name] 07/05/2024, 11:17 AM

## 2024-07-05 NOTE — Discharge Summary (Signed)
 Physician Discharge Summary   Patient: Mason [name] MRN: [id number] DOB: November 26, 1945  Admit date:     06/25/2024  Discharge date: 07/05/24  Discharge Physician: Alm Darean Rote   PCP: Cleatus Arlyss RAMAN, MD   Recommendations at discharge:   Please follow up with primary care provider within 1-2 weeks  Weekly CBC, bmp, CK as per OPAT orders  Please follow up on/with ID, Dr. Dea in 2 weeks     Hospital Course: Per H&P written by Dr. Pearlean on 06/25/2024 Mason [name] is a 78 y.o. 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 Patient was brought to the ED reports of elevated white blood count of 42,000 and fever.  He also presented with headache, reports intermittent right eye pain, that self resolved, and has been going on for a long time, he saw an eye doctor and was told nothing was wrong with his eye.  He denies eye pain today.  He has a chronic indwelling Foley catheter, it was exchanged earlier today, and had blood.  Patient tells me prior to the exchange he did not have any blood in his urine.  Also reports a cough.   ED Course: Temperature max-99.5.  Heart rate 104-119.  Respiratory rate 13-26.  O2 sats 90 to 97% on room air.  Systolic blood pressure  92 to 132.  Lactic acidosis 3> 5> 2.9> 3.4. WBC 38.4. Creatinine elevated 1.56. UA with positive nitrites and leukocytes, many bacteria. Head CT-no acute abnormality  CT chest abdomen pelvis without contrast- Layering debris in the trachea with mucous plugging and lower lobe bronchial wall thickening, reflecting airway inflammation/infection. Extensive left colon diverticulosis with mild sigmoid diverticulitis. Blood and urine cultures obtained. IV fluid bolus given IV cefepime , and azithromycin started.  When 06/25/24 blood cultures returned MRSA, cefepime  and azithro were discontinued and he was started on IV  vancomycin .  He gradually improved clinically.  Repeat blood cultures remained neg x 5 days.  ID was consulted and recommended TEE.  TEE was not successful due to unable to pass probe down esophagus.  GI was consulted for possible EGD.  Ultimately, patient decided he did not want any further procedures.  Palliative medicine was consulted and continued to follow the patient regarding GOC as pt intermittently refused meds. After multiple meetings and discussion with the patient regarding goals of care, the patient ultimately agreed to not pursue any further procedures.  Therefore EGD and TEE were canceled.  Infectious disease was notified.  Therefore, the patient would need a longer course of antibiotics to treat for possible endocarditis.  A PICC line was placed with which the patient agreed.  He was discharged to his nursing facility with daptomycin until 08/08/2024.  Assessment and Plan: severe sepsis with septic shock/MRSA bacteremia - due to MRSA bacteremia--source unclear - Sepsis present at time of admission with tachycardia, leukocytosis, endorgan dysfunction with acute kidney injury and the presence of lactic acidosis. - 06/25/24 blood culture = MRSA - Patient with positive MRSA PCR - started IV vancomycin >>daptomycin 07/03/24 - 2D echo not demonstrating large vegetations, grade 1 diastolic dysfunction and no wall motion abnormalities. - Appreciate assistance by cardiology service performing TEE. - Afebrile and in no acute distress. - No longer requiring pressors support;  - overall sepsis features resolved.  - Repeat blood culture--NGTD - TEE scheduled 07/02/2024--unable to pass probe>>GI consulted - 11/13--after GOC discussion--pt does not want any further procedures--cancel EGD and cancel TEE -  11/13--discussed with ID>>place PICC for daptomycin with stop date 08/08/24 - 07/05/24 PICC line placed   paroxysmal atrial fibrillation - Require transient use of Cardizem  drip>>po diltiazem  -  Rate controlled and currently sinus;  -continue Cardizem  orally; transitioning to extended release  - personally reviewed EKG on 06/26/24--afib RVR, nonspecific ST changes - currently on IV heparin >>apixaban  restart since no further invasive procedures will be pursued   Acute metabolic encephalopathy - due to acute infection - Head CT negative for acute abnormality - improved, back to baseline   acute kidney injury - baseline creatinine 0.7-0.9 - In the setting of sepsis and ATN - Continue aggressive fluid resuscitation - serum creatinine up to 1.56 - improved--serum creatinine 0.81 on day of dc   history of 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   urinary retention/BPH and chronic indwelling Foley catheter - Foley catheter in place (recently exchanged) - Continue Flomax  - urine culture unrevealing        Consultants: ID, GI, cardiology Procedures performed: PICC, attempted TEE  Disposition: Skilled nursing facility Diet recommendation:  Dysphagia type 3 thin Liquid DISCHARGE MEDICATION: Allergies as of 07/05/2024       Reactions   Shellfish Allergy Shortness Of Breath, Rash, Other (See Comments)   GI upset Difficulty breathing Shrimp and crab   Celexa  [citalopram ] Other (See Comments)   Aggression    Levsin [hyoscyamine] Other (See Comments)   Urinary retention   Librax [chlordiazepoxide-clidinium] Other (See Comments)   Urinary retention   Ak-mycin [erythromycin] Other (See Comments)   GI upset   Mobic  [meloxicam ] Other (See Comments)   GI upset   Penicillins Itching, Rash, Dermatitis   Received Unasyn  earlier without acute reaction        Medication List     STOP taking these medications    guaiFENesin 600 MG 12 hr tablet Commonly known as: MUCINEX       TAKE these medications    Baclofen  5 MG Tabs Take 1 tablet by mouth 3 (three) times  daily.   cetirizine 10 MG tablet Commonly known as: ZYRTEC Take 10 mg by mouth daily.   daptomycin IVPB Commonly known as: CUBICIN Inject 600 mg into the vein daily. Indication:  MRSA Bacteremia First Dose: Yes Last Day of Therapy: 08/08/24 Labs - Once weekly:  CBC/D, BMP, and CPK Labs - Once weekly: ESR and CRP Method of administration: IV Push or Per SNF Protocol (IV Infusion) Method of administration may be changed at the discretion of home infusion pharmacist based upon assessment of the patient and/or caregiver's ability to self-administer the medication ordered.   diltiazem  120 MG 24 hr capsule Commonly known as: CARDIZEM  CD Take 1 capsule (120 mg total) by mouth daily. Start taking on: July 06, 2024   Eliquis  2.5 MG Tabs tablet Generic drug: apixaban  Take 2.5 mg by mouth 2 (two) times daily. 0800, 2000   lactose free nutrition Liqd Take 240 mLs by mouth 3 (three) times daily between meals.   latanoprost  0.005 % ophthalmic solution Commonly known as: XALATAN  Place 1 drop into both eyes at bedtime.   melatonin 3 MG Tabs tablet Take 1 tablet (3 mg total) by mouth at bedtime as needed. What changed: reasons to take this   methocarbamol  500 MG tablet Commonly known as: ROBAXIN  Take 1 tablet (500 mg total) by mouth every 8 (eight) hours as needed for muscle spasms.   nystatin  ointment Commonly known  as: MYCOSTATIN  Apply 1 Application topically every 12 (twelve) hours as needed (chronic fungal rash). Apply to glans penis/scrotum   polyethylene glycol 17 g packet Commonly known as: MIRALAX  / GLYCOLAX  Take 17 g by mouth 2 (two) times daily.   pregabalin  50 MG capsule Commonly known as: LYRICA  Take 50 mg by mouth 2 (two) times daily.   Proctozone -HC 2.5 % rectal cream Generic drug: hydrocortisone  Place 1 Application rectally every 6 (six) hours as needed for hemorrhoids (after bowel movements).   senna-docusate 8.6-50 MG tablet Commonly known as:  Senokot-S Take 1 tablet by mouth daily.   sertraline 25 MG tablet Commonly known as: ZOLOFT Take 25 mg by mouth daily.   tamsulosin  0.4 MG Caps capsule Commonly known as: FLOMAX  Take 1 capsule (0.4 mg total) by mouth daily after supper. What changed: when to take this   timolol  0.25 % ophthalmic solution Commonly known as: TIMOPTIC  Place 1 drop into both eyes daily.               Home Infusion Instuctions  (From admission, onward)           Start     Ordered   07/05/24 0000  Home infusion instructions       Comments: Will be administered at patient's SNF  Question:  Instructions  Answer:  Flushing of vascular access device: 0.9% NaCl pre/post medication administration and prn patency; Heparin  100 u/ml, 5ml for implanted ports and Heparin  10u/ml, 5ml for all other central venous catheters.   07/05/24 1031            Contact information for after-discharge care     Destination     [facility]   Service: Skilled Nursing Contact information: [address] [place]  [address] [phone]                    Discharge Exam: Fredricka Weights   06/26/24 0303 06/28/24 2000 07/05/24 0501  Weight: 76.4 kg 77.2 kg 71.3 kg   HEENT:  [place]/AT, No thrush, no icterus CV:  RRR, no rub, no S3, no S4 Lung:  CTA, no wheeze, no rhonchi Abd:  soft/+BS, NT Ext:  No edema, no lymphangitis, no synovitis, no rash   Condition at discharge: stable  The results of significant diagnostics from this hospitalization (including imaging, microbiology, ancillary and laboratory) are listed below for reference.   Imaging Studies: US  EKG SITE RITE Result Date: 07/04/2024 If Site Rite image not attached, placement could not be confirmed due to current cardiac rhythm.  ECHOCARDIOGRAM COMPLETE Result Date: 06/27/2024    ECHOCARDIOGRAM REPORT   Patient Name:   ILHAN MADAN [facility] Date of Exam: 06/27/2024 Medical Rec #:  [id number]         Height:        67.5 in Accession #:    [id number]        Weight:       168.4 lb Date of Birth:  [birth year]        BSA:          1.890 m Patient Age:    77 years          BP:           115/71 mmHg Patient Gender: M                 HR:           62 bpm. Exam Location:  [facility] Procedure: 2D  Echo, Cardiac Doppler, Color Doppler and Intracardiac            Opacification Agent (Both Spectral and Color Flow Doppler were            utilized during procedure). Indications:    Bacteremia  History:        Patient has prior history of Echocardiogram examinations, most                 recent 04/26/2023. Abnormal ECG, Stroke, Arrythmias:Atrial                 Fibrillation, Signs/Symptoms:Altered Mental Status; Risk                 Factors:Hypertension and Former Smoker. Cancer.  Sonographer:    Ellouise Mose RDCS Referring Phys: [phone] CARLOS MADERA IMPRESSIONS  1. Left ventricular ejection fraction, by estimation, is 60 to 65%. The left ventricle has normal function. Left ventricular endocardial border not optimally defined to evaluate regional wall motion. There is mild left ventricular hypertrophy. Left ventricular diastolic parameters are consistent with Grade I diastolic dysfunction (impaired relaxation).  2. Right ventricular systolic function is normal. The right ventricular size is mildly enlarged. There is normal pulmonary artery systolic pressure. The estimated right ventricular systolic pressure is 24.0 mmHg.  3. The mitral valve is normal in structure. No evidence of mitral valve regurgitation. No evidence of mitral stenosis.  4. The aortic valve was not well visualized. Aortic valve regurgitation is not visualized. No aortic stenosis is present.  5. The inferior vena cava is normal in size with <50% respiratory variability, suggesting right atrial pressure of 8 mmHg. FINDINGS  Left Ventricle: Left ventricular ejection fraction, by estimation, is 60 to 65%. The left ventricle has normal function. Left ventricular endocardial border  not optimally defined to evaluate regional wall motion. Definity contrast agent was given IV to delineate the left ventricular endocardial borders. The left ventricular internal cavity size was normal in size. There is mild left ventricular hypertrophy. Left ventricular diastolic parameters are consistent with Grade I diastolic dysfunction (impaired relaxation). Right Ventricle: The right ventricular size is mildly enlarged. Right vetricular wall thickness was not well visualized. Right ventricular systolic function is normal. There is normal pulmonary artery systolic pressure. The tricuspid regurgitant velocity  is 2.00 m/s, and with an assumed right atrial pressure of 8 mmHg, the estimated right ventricular systolic pressure is 24.0 mmHg. Left Atrium: Left atrial size was normal in size. Right Atrium: Right atrial size was normal in size. Pericardium: There is no evidence of pericardial effusion. Mitral Valve: The mitral valve is normal in structure. No evidence of mitral valve regurgitation. No evidence of mitral valve stenosis. Tricuspid Valve: The tricuspid valve is normal in structure. Tricuspid valve regurgitation is trivial. No evidence of tricuspid stenosis. Aortic Valve: The aortic valve was not well visualized. Aortic valve regurgitation is not visualized. No aortic stenosis is present. Aortic valve mean gradient measures 3.9 mmHg. Aortic valve peak gradient measures 8.8 mmHg. Aortic valve area, by VTI measures 2.36 cm. Pulmonic Valve: The pulmonic valve was not well visualized. Pulmonic valve regurgitation is not visualized. No evidence of pulmonic stenosis. Aorta: The aortic root and ascending aorta are structurally normal, with no evidence of dilitation. Venous: The inferior vena cava is normal in size with less than 50% respiratory variability, suggesting right atrial pressure of 8 mmHg. IAS/Shunts: No atrial level shunt detected by color flow Doppler.  LEFT VENTRICLE PLAX 2D LVIDd:  4.10 cm       Diastology LVIDs:         2.70 cm      LV e' medial:    9.03 cm/s LV PW:         1.10 cm      LV E/e' medial:  7.5 LV IVS:        1.10 cm      LV e' lateral:   11.10 cm/s LVOT diam:     2.20 cm      LV E/e' lateral: 6.1 LV SV:         62 LV SV Index:   33 LVOT Area:     3.80 cm  LV Volumes (MOD) LV vol d, MOD A2C: 107.0 ml LV vol d, MOD A4C: 96.6 ml LV vol s, MOD A2C: 55.5 ml LV vol s, MOD A4C: 45.8 ml LV SV MOD A2C:     51.5 ml LV SV MOD A4C:     96.6 ml LV SV MOD BP:      52.1 ml RIGHT VENTRICLE             IVC RV S prime:     16.20 cm/s  IVC diam: 2.00 cm TAPSE (M-mode): 2.3 cm LEFT ATRIUM             Index        RIGHT ATRIUM           Index LA diam:        3.50 cm 1.85 cm/m   RA Area:     17.40 cm LA Vol (A2C):   33.2 ml 17.56 ml/m  RA Volume:   46.70 ml  24.71 ml/m LA Vol (A4C):   27.3 ml 14.44 ml/m LA Biplane Vol: 33.4 ml 17.67 ml/m  AORTIC VALVE AV Area (Vmax):    2.06 cm AV Area (Vmean):   2.18 cm AV Area (VTI):     2.36 cm AV Vmax:           148.50 cm/s AV Vmean:          90.400 cm/s AV VTI:            0.261 m AV Peak Grad:      8.8 mmHg AV Mean Grad:      3.9 mmHg LVOT Vmax:         80.45 cm/s LVOT Vmean:        51.900 cm/s LVOT VTI:          0.162 m LVOT/AV VTI ratio: 0.62  AORTA Ao Root diam: 3.00 cm Ao Asc diam:  3.60 cm MITRAL VALVE               TRICUSPID VALVE MV Area (PHT): 3.53 cm    TR Peak grad:   16.0 mmHg MV Decel Time: 215 msec    TR Vmax:        200.00 cm/s MV E velocity: 67.60 cm/s MV A velocity: 84.35 cm/s  SHUNTS MV E/A ratio:  0.80        Systemic VTI:  0.16 m                            Systemic Diam: 2.20 cm Dorn Ross MD Electronically signed by Dorn Ross MD Signature Date/Time: 06/27/2024/1:53:32 PM    Final    CT CHEST ABDOMEN PELVIS WO CONTRAST Result Date: 06/25/2024 EXAM: CT CHEST, ABDOMEN AND PELVIS WITHOUT CONTRAST  06/25/2024 06:38:36 PM TECHNIQUE: CT of the chest, abdomen and pelvis was performed without the administration of intravenous contrast.  Multiplanar reformatted images are provided for review. Automated exposure control, iterative reconstruction, and/or weight based adjustment of the mA/kV was utilized to reduce the radiation dose to as low as reasonably achievable. COMPARISON: Chest radiograph 06/25/2024, CT chest 04/26/2023, CT abdomen and pelvis 10/09/2022. CLINICAL HISTORY: Sepsis. FINDINGS: CHEST: MEDIASTINUM AND LYMPH NODES: Heart and pericardium are unremarkable. The central airways are clear. No mediastinal, hilar or axillary lymphadenopathy. LUNGS AND PLEURA: Layering debris in the trachea. Mucous plugging in the lower lobes. Lower lobe bronchial wall thickening. Scarring in the lower lobes. No pleural effusion or pneumothorax. ABDOMEN AND PELVIS: LIVER: The liver is unremarkable. GALLBLADDER AND BILE DUCTS: Gallbladder is unremarkable. No biliary ductal dilatation. SPLEEN: No acute abnormality. PANCREAS: No acute abnormality. ADRENAL GLANDS: No acute abnormality. KIDNEYS, URETERS AND BLADDER: Cord catheter and gas in the decompressed bladder. No stones in the kidneys or ureters. No hydronephrosis. No perinephric or periureteral stranding. GI AND BOWEL: Stomach demonstrates no acute abnormality. Extensive sigmoid colon diverticulosis. Trace stranding about the proximal sigmoid colon (cervical series 5 image 517). There is no bowel obstruction. REPRODUCTIVE ORGANS: Enlarged prostate. PERITONEUM AND RETROPERITONEUM: No ascites. No free air. VASCULATURE: Aorta is normal in caliber. ABDOMINAL AND PELVIS LYMPH NODES: No lymphadenopathy. BONES AND SOFT TISSUES: No acute osseous abnormality. No focal soft tissue abnormality. IMPRESSION: 1. Layering debris in the trachea with mucous plugging and lower lobe bronchial wall thickening, reflecting airway inflammation/infection. 2. Extensive left colon diverticulosis with mild sigmoid diverticulitis. Electronically signed by: Norman Gatlin MD 06/25/2024 06:54 PM EST RP Workstation: [id number]   CT  Head Wo Contrast Result Date: 06/25/2024 CLINICAL DATA:  Headache x2 weeks. EXAM: CT HEAD WITHOUT CONTRAST TECHNIQUE: Contiguous axial images were obtained from the base of the skull through the vertex without intravenous contrast. RADIATION DOSE REDUCTION: This exam was performed according to the departmental dose-optimization program which includes automated exposure control, adjustment of the mA and/or kV according to patient size and/or use of iterative reconstruction technique. COMPARISON:  August 31, 2023 FINDINGS: Brain: There is generalized cerebral atrophy with widening of the extra-axial spaces and ventricular dilatation. There are areas of decreased attenuation within the white matter tracts of the supratentorial brain, consistent with microvascular disease changes. 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 Vascular: No hyperdense vessel or unexpected calcification. Skull: Normal. Negative for fracture or focal lesion. Sinuses/Orbits: There is mild to moderate severity bilateral ethmoid sinus mucosal thickening. Other: None. IMPRESSION: 1. 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 2. 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 3. No acute intracranial abnormality. Electronically Signed   By: Suzen Dials M.D.   On: 06/25/2024 18:53   DG Chest Port 1 View Result Date: 06/25/2024 EXAM: 1 VIEW(S) XRAY OF THE CHEST 06/25/2024 03:04:00 PM COMPARISON: Chest x-ray 01/10/2024. Chest CT 01/24/2024. CLINICAL HISTORY: Questionable sepsis - evaluate for abnormality. FINDINGS: LUNGS AND PLEURA: Small nodular density seen on previous x-ray likely overlies the first rib end on today's study. No pulmonary edema. No pleural effusion. No pneumothorax. HEART AND MEDIASTINUM: No acute abnormality of the cardiac and mediastinal silhouettes. BONES  AND SOFT TISSUES: No acute osseous abnormality. IMPRESSION: 1. No acute findings. 2. Small nodular density seen on previous x-ray likely overlies the first rib end on today's study. Follow-up chest CT should be considered. Electronically signed  by: Greig Pique MD 06/25/2024 03:34 PM EST RP Workstation: [id number]    Microbiology: Results for orders placed or performed during the hospital encounter of 06/25/24  Blood Culture (routine x 2)     Status: None   Collection Time: 06/25/24  2:25 PM   Specimen: BLOOD  Result Value Ref Range Status   Specimen Description BLOOD RIGHT ARM  Final   Special Requests   Final    BOTTLES DRAWN AEROBIC AND ANAEROBIC Blood Culture results may not be optimal due to an inadequate volume of blood received in culture bottles   Culture   Final    NO GROWTH 5 DAYS Performed at [facility], [address], [place], [place] [address]    Report Status 06/30/2024 FINAL  Final  Blood Culture (routine x 2)     Status: Abnormal (Preliminary result)   Collection Time: 06/25/24  2:35 PM   Specimen: BLOOD  Result Value Ref Range Status   Specimen Description   Final    BLOOD LEFT ANTECUBITAL Performed at [facility], [address], [place], [place] [address]    Special Requests   Final    BOTTLES DRAWN AEROBIC AND ANAEROBIC Blood Culture results may not be optimal due to an inadequate volume of blood received in culture bottles Performed at [facility], [address] [place], [place] [address]    Culture  Setup Time   Final    BOTTLES DRAWN AEROBIC ONLY GRAM POSITIVE COCCI Gram Stain Report Called to,Read Back By and Verified With: C. ROGERS RN 06/26/24 @1203  BY J. WHITE CRITICAL RESULT CALLED TO, READ BACK BY AND VERIFIED WITH: RN CSABRA AHLE [phone] @ ELIYA.EDU FH    Culture (A)  Final    METHICILLIN RESISTANT STAPHYLOCOCCUS AUREUS Sent to [facility] for further susceptibility testing. Performed at [facility], [address], [place], [place] [address]     Report Status PENDING  Incomplete   Organism ID, Bacteria METHICILLIN RESISTANT STAPHYLOCOCCUS AUREUS  Final      Susceptibility   Methicillin resistant staphylococcus aureus - MIC*    CIPROFLOXACIN  4 RESISTANT Resistant     ERYTHROMYCIN >=8 RESISTANT Resistant     GENTAMICIN <=0.5 SENSITIVE Sensitive     OXACILLIN >=4 RESISTANT Resistant     TETRACYCLINE <=1 SENSITIVE Sensitive     VANCOMYCIN  1 SENSITIVE Sensitive     TRIMETH /SULFA  <=10 SENSITIVE Sensitive     CLINDAMYCIN <=0.25 SENSITIVE Sensitive     RIFAMPIN <=0.5 SENSITIVE Sensitive     Inducible Clindamycin NEGATIVE Sensitive     LINEZOLID 2 SENSITIVE Sensitive     * METHICILLIN RESISTANT STAPHYLOCOCCUS AUREUS  Blood Culture ID Panel (Reflexed)     Status: Abnormal   Collection Time: 06/25/24  2:35 PM  Result Value Ref Range Status   Enterococcus faecalis NOT DETECTED NOT DETECTED Final   Enterococcus Faecium NOT DETECTED NOT DETECTED Final   Listeria monocytogenes NOT DETECTED NOT DETECTED Final   Staphylococcus species DETECTED (A) NOT DETECTED Final    Comment: CRITICAL RESULT CALLED TO, READ BACK BY AND VERIFIED WITH: RN C. COOK [phone] @ [phone] FH    Staphylococcus aureus (BCID) DETECTED (A) NOT DETECTED Final    Comment: Methicillin (oxacillin)-resistant Staphylococcus aureus (MRSA). MRSA is predictably resistant to beta-lactam antibiotics (except ceftaroline). Preferred therapy is vancomycin  unless clinically contraindicated. Patient requires contact precautions if  hospitalized. CRITICAL RESULT CALLED TO, READ BACK BY AND VERIFIED WITH: RN KYM AHLE [phone] @ [phone] FH  Staphylococcus epidermidis NOT DETECTED NOT DETECTED Final   Staphylococcus lugdunensis NOT DETECTED NOT DETECTED Final   Streptococcus species NOT DETECTED NOT DETECTED Final   Streptococcus agalactiae NOT DETECTED NOT DETECTED Final   Streptococcus pneumoniae NOT DETECTED NOT DETECTED Final   Streptococcus pyogenes NOT DETECTED NOT DETECTED Final    A.calcoaceticus-baumannii NOT DETECTED NOT DETECTED Final   Bacteroides fragilis NOT DETECTED NOT DETECTED Final   Enterobacterales NOT DETECTED NOT DETECTED Final   Enterobacter cloacae complex NOT DETECTED NOT DETECTED Final   Escherichia coli NOT DETECTED NOT DETECTED Final   Klebsiella aerogenes NOT DETECTED NOT DETECTED Final   Klebsiella oxytoca NOT DETECTED NOT DETECTED Final   Klebsiella pneumoniae NOT DETECTED NOT DETECTED Final   Proteus species NOT DETECTED NOT DETECTED Final   Salmonella species NOT DETECTED NOT DETECTED Final   Serratia marcescens NOT DETECTED NOT DETECTED Final   Haemophilus influenzae NOT DETECTED NOT DETECTED Final   Neisseria meningitidis NOT DETECTED NOT DETECTED Final   Pseudomonas aeruginosa NOT DETECTED NOT DETECTED Final   Stenotrophomonas maltophilia NOT DETECTED NOT DETECTED Final   Candida albicans NOT DETECTED NOT DETECTED Final   Candida auris NOT DETECTED NOT DETECTED Final   Candida glabrata NOT DETECTED NOT DETECTED Final   Candida krusei NOT DETECTED NOT DETECTED Final   Candida parapsilosis NOT DETECTED NOT DETECTED Final   Candida tropicalis NOT DETECTED NOT DETECTED Final   Cryptococcus neoformans/gattii NOT DETECTED NOT DETECTED Final   Meth resistant mecA/C and MREJ DETECTED (A) NOT DETECTED Final    Comment: CRITICAL RESULT CALLED TO, READ BACK BY AND VERIFIED WITH: RN KYM AHLE [phone] @ [phone] FH Performed at [facility], [address] [place], [place] [address]   MIC (1 Drug)-     Status: Abnormal   Collection Time: 06/25/24  2:35 PM  Result Value Ref Range Status   Min Inhibitory Conc (1 Drug) Final report (A)  Corrected    Comment: (NOTE) Performed At: [facility] [address] [place], [place] [id number] Jennette Shorter MD Ey:[id number] CORRECTED ON 11/11 AT 1436: PREVIOUSLY REPORTED AS Preliminary report    Source [id number] MRSA DAPTOMYCIN BLOOD  Final    Comment: Performed at [facility], [address], [place], [place] [address]  MIC Result     Status: Abnormal   Collection Time: 06/25/24  2:35 PM  Result Value Ref Range Status   Result 1 (MIC) Comment (A)  Final    Comment: (NOTE) Methicillin - resistant Staphylococcus aureus Identification performed by account, not confirmed by this laboratory. Testing performed by gradient elution. DAPTOMYCIN  0.75 ug/mL  SUSCEPTIBLE Performed At: [facility] [address] [place], [place] [id number] Jennette Shorter MD Ey:[id number]   Urine Culture     Status: Abnormal   Collection Time: 06/25/24  4:01 PM   Specimen: Urine, Random  Result Value Ref Range Status   Specimen Description   Final    URINE, RANDOM Performed at [facility], [address], [place], [place] [address]    Special Requests   Final    NONE Reflexed from [id number] Performed at [facility], [address], [place], [place] [address]    Culture MULTIPLE SPECIES PRESENT, SUGGEST RECOLLECTION (A)  Final   Report Status 06/27/2024 FINAL  Final  MRSA Next Gen by PCR, Nasal     Status: Abnormal   Collection Time: 06/26/24  2:41 AM   Specimen: Nasal Mucosa; Nasal Swab  Result Value Ref Range Status  MRSA by PCR Next Gen DETECTED (A) NOT DETECTED Final    Comment: RESULT CALLED TO, READ BACK BY AND VERIFIED WITH: B. SUPER [phone], VIRAY,J (NOTE) The GeneXpert MRSA Assay (FDA approved for NASAL specimens only), is one component of a comprehensive MRSA colonization surveillance program. It is not intended to diagnose MRSA infection nor to guide or monitor treatment for MRSA infections. Test performance is not FDA approved in patients less than 59 years old. Performed at [facility], [address], [place], [place] [address]   Culture, blood (Routine X 2) w Reflex to ID Panel     Status: None   Collection Time: 06/27/24  8:56 AM   Specimen: BLOOD  Result Value Ref Range Status   Specimen Description BLOOD BLOOD RIGHT HAND  Final   Special Requests    Final    BOTTLES DRAWN AEROBIC AND ANAEROBIC Blood Culture adequate volume   Culture   Final    NO GROWTH 5 DAYS Performed at [facility], [address] [place], [place] [address]    Report Status 07/02/2024 FINAL  Final  Culture, blood (Routine X 2) w Reflex to ID Panel     Status: None   Collection Time: 06/27/24  9:03 AM   Specimen: BLOOD  Result Value Ref Range Status   Specimen Description BLOOD RIGHT ANTECUBITAL  Final   Special Requests   Final    BOTTLES DRAWN AEROBIC AND ANAEROBIC Blood Culture adequate volume   Culture   Final    NO GROWTH 5 DAYS Performed at [facility], [address] [place], [place] [address]    Report Status 07/02/2024 FINAL  Final    Labs: CBC: Recent Labs  Lab 06/30/24 0429 07/02/24 0420 07/03/24 0358 07/04/24 0849 07/05/24 0751  WBC 14.9* 13.9* 12.5* 13.1* 13.2*  HGB 14.5 15.1 15.8 16.3 15.1  HCT 45.7 47.5 49.1 51.4 47.7  MCV 82.0 82.2 82.5 83.3 82.5  PLT 457* 464* 488* 476* 494*   Basic Metabolic Panel: Recent Labs  Lab 06/30/24 0429 07/01/24 0419 07/02/24 0420 07/03/24 0358 07/04/24 0849 07/05/24 0751  NA 139  --  139  --  137  --   K 3.7  --  4.0  --  4.1  --   CL 105  --  105  --  101  --   CO2 27  --  27  --  28  --   GLUCOSE 87  --  86  --  112*  --   BUN 13  --  13  --  14  --   CREATININE 0.83 0.69 0.76 0.69 0.79 0.81  CALCIUM 8.1*  --  8.3*  --  8.5*  --   MG  --   --  2.2  --  2.2  --    Liver Function Tests: No results for input(s): AST, ALT, ALKPHOS, BILITOT, PROT, ALBUMIN in the last 168 hours. CBG: Recent Labs  Lab 06/30/24 0742 07/04/24 1933  GLUCAP 110* 136*    Discharge time spent: greater than 30 minutes.  Signed: Alm Schneider, MD [facility] 07/05/2024

## 2024-07-05 NOTE — Progress Notes (Signed)
 Physical Therapy Treatment Patient Details Name: Mason [name] MRN: [id number] DOB: [birth year] Today's Date: 07/05/2024   History of Present Illness Mason [name] is a 78 y.o. 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  Patient was brought to the ED reports of elevated white blood count of 42,000 and fever.  He also presented with headache, reports intermittent right eye pain, that self resolved, and has been going on for a long time, he saw an eye doctor and was told nothing was wrong with his eye.  He denies eye pain today.  He has a chronic indwelling Foley catheter, it was exchanged earlier today, and had blood.  Patient tells me prior to the exchange he did not have any blood in his urine.  Also reports a cough.    PT Comments  Patient had to use bed rail for sitting up at bedside, very unsteady with frequent posterior leaning upon sitting that resolved after stretching to low back and instruction for patient to hold onto left knee with LUE. Patient able to complete BLE exercises with active assistance and frequent tactile cueing to avoid falling backwards. Patient put back to bed after therapy due to planned procedure requiring him in bed. Patient will benefit from continued skilled physical therapy in hospital and recommended venue below to increase strength, balance, endurance for safe ADLs and gait.     If plan is discharge home, recommend the following: A lot of help with bathing/dressing/bathroom;A lot of help with walking and/or transfers;Help with stairs or ramp for entrance;Assist for transportation;Assistance with cooking/housework   Can travel by private vehicle     No  Equipment Recommendations  None recommended by PT    Recommendations for Other Services       Precautions / Restrictions Precautions Precautions: Fall Recall of Precautions/Restrictions:  Intact Restrictions Weight Bearing Restrictions Per Provider Order: No     Mobility  Bed Mobility Overal bed mobility: Needs Assistance Bed Mobility: Rolling, Sidelying to Sit Rolling: Mod assist Sidelying to sit: Mod assist, Max assist       General bed mobility comments: increased time, labored movement    Transfers Overall transfer level: Needs assistance   Transfers: Sit to/from Stand Sit to Stand: Max assist           General transfer comment: poor tolerance for standing due to right sided weakness, poor tolerance for standing on LLE    Ambulation/Gait                   Stairs             Wheelchair Mobility     Tilt Bed    Modified Rankin (Stroke Patients Only)       Balance Overall balance assessment: Needs assistance Sitting-balance support: Feet supported, Single extremity supported Sitting balance-Leahy Scale: Poor Sitting balance - Comments: fair/poor seated at EOB Postural control: Posterior lean Standing balance support: During functional activity, No upper extremity supported Standing balance-Leahy Scale: Poor Standing balance comment: hand held assist                            Communication Communication Communication: No apparent difficulties  Cognition Arousal: Alert Behavior During Therapy: WFL for tasks assessed/performed   PT - Cognitive impairments: No apparent impairments  Following commands: Intact      Cueing Cueing Techniques: Verbal cues, Tactile cues  Exercises General Exercises - Lower Extremity Long Arc Quad: Seated, AROM, Strengthening, Left, 10 reps Hip Flexion/Marching: Seated, AROM, Strengthening, Left, 10 reps Toe Raises: Seated, AROM, Strengthening, Left, 10 reps Heel Raises: Seated, AROM, Strengthening, Left, 10 reps    General Comments        Pertinent Vitals/Pain Pain Assessment Pain Assessment: Faces Faces Pain Scale: Hurts little  more Pain Location: RUE with movement Pain Descriptors / Indicators: Sore, Grimacing, Discomfort, Guarding Pain Intervention(s): Limited activity within patient's tolerance, Monitored during session, Repositioned    Home Living                          Prior Function            PT Goals (current goals can now be found in the care plan section) Acute Rehab PT Goals Patient Stated Goal: return home PT Goal Formulation: With patient Time For Goal Achievement: 07/13/24 Potential to Achieve Goals: Fair Progress towards PT goals: Progressing toward goals    Frequency    Min 2X/week      PT Plan      Co-evaluation              AM-PAC PT 6 Clicks Mobility   Outcome Measure  Help needed turning from your back to your side while in a flat bed without using bedrails?: A Lot Help needed moving from lying on your back to sitting on the side of a flat bed without using bedrails?: A Lot Help needed moving to and from a bed to a chair (including a wheelchair)?: A Lot Help needed standing up from a chair using your arms (e.g., wheelchair or bedside chair)?: A Lot Help needed to walk in hospital room?: Total Help needed climbing 3-5 steps with a railing? : Total 6 Click Score: 10    End of Session   Activity Tolerance: Patient tolerated treatment well;Patient limited by fatigue Patient left: in bed;with call bell/phone within reach Nurse Communication: Mobility status PT Visit Diagnosis: Unsteadiness on feet (R26.81);Other abnormalities of gait and mobility (R26.89);Muscle weakness (generalized) (M62.81)     Time: 8981-8950 PT Time Calculation (min) (ACUTE ONLY): 31 min  Charges:    $Therapeutic Exercise: 8-22 mins $Therapeutic Activity: 8-22 mins PT General Charges $$ ACUTE PT VISIT: 1 Visit                     12:31 PM, 07/05/24 Lynwood Music, MPT Physical Therapist with [facility] [phone] office [phone] mobile phone

## 2024-07-05 NOTE — NC FL2 (Signed)
 [place]  MEDICAID FL2 LEVEL OF CARE FORM     IDENTIFICATION  Patient Name: Mason [name] Birthdate: Jan 08, 1946 Sex: male Admission Date (Current Location): 06/25/2024  [facility] and [place] Number:  [employer] and Address:  [facility],  [address] [place] [address]      Provider Number: [id number]  Attending Physician Name and Address:  Evonnie Lenis, MD  Relative Name and Phone Number:       Current Level of Care: Hospital Recommended Level of Care: Skilled Nursing Facility Prior Approval Number:    Date Approved/Denied:   PASRR Number: [id number]  Discharge Plan: SNF    Current Diagnoses: Patient Active Problem List   Diagnosis Date Noted   MRSA bacteremia 07/04/2024   Severe sepsis (HCC) 06/25/2024   UTI (urinary tract infection) 06/25/2024   PNA (pneumonia) 06/25/2024   AKI (acute kidney injury) 06/25/2024   Essential hypertension 05/12/2024   History of urinary retention 10/30/2023   History of elevated PSA 10/30/2023   Weakness of right side of body 06/05/2023   History of hemorrhagic stroke with residual hemiplegia (HCC) 05/30/2023   Glaucoma 05/30/2023   Former smoker 05/30/2023   Ambulatory dysfunction 05/30/2023   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   Atrial fibrillation (HCC) 09/29/2019   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/18/2019   Sepsis (HCC) 09/18/2019   Degenerative disc disease, cervical 01/23/2019   Skin irritation 10/28/2018   Encounter for diagnostic endoscopy 06/25/2017   Low HDL (under 40) 05/24/2016   BPH (benign prostatic hyperplasia) 12/10/2013   Erectile dysfunction 08/26/2013   GERD (gastroesophageal reflux disease) 06/28/2013   Depression 04/01/2011   Anxiety state 12/15/2007   PEPTIC STRICTURE 12/15/2007   Irritable bowel syndrome 12/23/2006    Orientation RESPIRATION BLADDER Height & Weight     Self, Place  Normal Indwelling catheter Weight: 157 lb 3 oz (71.3 kg) Height:  5' 7.5 (171.5 cm)   BEHAVIORAL SYMPTOMS/MOOD NEUROLOGICAL BOWEL NUTRITION STATUS      Incontinent Diet (See d/c summary)  AMBULATORY STATUS COMMUNICATION OF NEEDS Skin   Extensive Assist Verbally Other (Comment) (Redness to arm, face, hand, leg, chest, sacrum)                       Personal Care Assistance Level of Assistance  Bathing, Feeding, Dressing Bathing Assistance: Maximum assistance Feeding assistance: Limited assistance Dressing Assistance: Maximum assistance     Functional Limitations Info  Sight, Hearing, Speech Sight Info: Adequate Hearing Info: Adequate Speech Info: Adequate    SPECIAL CARE FACTORS FREQUENCY                       Contractures Contractures Info: Not present    Additional Factors Info  Code Status, Allergies, Psychotropic Code Status Info: DNR- Limited Allergies Info: Shellfish Allergy  Celexa  (Citalopram )  Levsin (Hyoscyamine)  Librax (Chlordiazepoxide-clidinium)  Ak-mycin (Erythromycin)  Mobic  (Meloxicam )  Penicillins Psychotropic Info: Zoloft         Current Medications (07/05/2024):  This is the current hospital active medication list Current Facility-Administered Medications  Medication Dose Route Frequency Provider Last Rate Last Admin   acetaminophen  (TYLENOL ) tablet 650 mg  650 mg Oral Q6H PRN Ricky Fines, MD   650 mg at 07/02/24 9470   Or   acetaminophen  (TYLENOL ) suppository 650 mg  650 mg Rectal Q6H PRN Ricky Fines, MD       apixaban  (ELIQUIS ) tablet 2.5 mg  2.5 mg Oral BID Evonnie Lenis, MD  baclofen  (LIORESAL ) tablet 5 mg  5 mg Oral TID Ricky Fines, MD   5 mg at 07/05/24 9180   Chlorhexidine  Gluconate Cloth 2 % PADS 6 each  6 each Topical Daily Ricky Fines, MD   6 each at 07/04/24 0837   DAPTOmycin (CUBICIN) IVPB 700 mg/166mL premix  700 mg Intravenous Q1400 Manandhar, Sabina, MD   Stopped at 07/04/24 1513   diltiazem  (CARDIZEM  CD) 24 hr capsule 120 mg  120 mg Oral Daily Ricky Fines, MD   120 mg at 07/05/24 9178    guaiFENesin-dextromethorphan (ROBITUSSIN DM) 100-10 MG/5ML syrup 5 mL  5 mL Oral Q4H PRN Ricky Fines, MD       latanoprost  (XALATAN ) 0.005 % ophthalmic solution 1 drop  1 drop Both Eyes QHS Ricky Fines, MD   1 drop at 07/04/24 2022   mupirocin ointment (BACTROBAN) 2 %   Nasal BID Ricky Fines, MD   Given at 07/05/24 9178   ondansetron  (ZOFRAN ) tablet 4 mg  4 mg Oral Q6H PRN Ricky Fines, MD       Or   ondansetron  (ZOFRAN ) injection 4 mg  4 mg Intravenous Q6H PRN Ricky Fines, MD       Oral care mouth rinse  15 mL Mouth Rinse PRN Ricky Fines, MD       polyethylene glycol (MIRALAX  / GLYCOLAX ) packet 17 g  17 g Oral Daily PRN Ricky Fines, MD       sertraline (ZOLOFT) tablet 25 mg  25 mg Oral Daily Ricky Fines, MD   25 mg at 07/05/24 9178   tamsulosin  (FLOMAX ) capsule 0.4 mg  0.4 mg Oral QPM Ricky Fines, MD   0.4 mg at 07/04/24 1715   timolol  (TIMOPTIC ) 0.25 % ophthalmic solution 1 drop  1 drop Both Eyes Daily Ricky Fines, MD   1 drop at 07/04/24 9161   traZODone  (DESYREL ) tablet 50 mg  50 mg Oral QHS PRN Ricky Fines, MD   50 mg at 07/02/24 2346     Discharge Medications: Please see discharge summary for a list of discharge medications.  Relevant Imaging Results:  Relevant Lab Results:   Additional Information SSN: [id number], [place]

## 2024-07-05 NOTE — Progress Notes (Signed)
 Peripherally Inserted Central Catheter Placement  The IV Nurse has discussed with the patient and/or persons authorized to consent for the patient, the purpose of this procedure and the potential benefits and risks involved with this procedure.  The benefits include less needle sticks, lab draws from the catheter, and the patient may be discharged home with the catheter. Risks include, but not limited to, infection, bleeding, blood clot (thrombus formation), and puncture of an artery; nerve damage and irregular heartbeat and possibility to perform a PICC exchange if needed/ordered by physician.  Alternatives to this procedure were also discussed.  Bard Power PICC patient education guide, fact sheet on infection prevention and patient information card has been provided to patient /or left at bedside.    PICC Placement Documentation  PICC Single Lumen 02-Aug-2024 Left Brachial 44 cm 0 cm (Active)  Indication for Insertion or Continuance of Line Home intravenous therapies (PICC only) 08/02/24 1203  Exposed Catheter (cm) 0 cm Aug 02, 2024 1203  Site Assessment Clean, Dry, Intact 02-Aug-2024 1203  Line Status Flushed;Saline locked;Blood return noted;Dead end cap in place 02-Aug-2024 1203  Dressing Type Transparent;Securing device 08/02/2024 1203  Dressing Status Antimicrobial disc/dressing in place;Clean, Dry, Intact August 02, 2024 1203  Line Care Cap changed;Connections checked and tightened 02-Aug-2024 1203  Dressing Change Due 07/12/24 08/02/2024 1203       Mason [name] CHRISTELLA Bohr 08-02-24, 12:34 PM

## 2024-07-05 NOTE — TOC Transition Note (Signed)
 Transition of Care [facility]) - Discharge Note   Patient Details  Name: Mason [name] MRN: [id number] Date of Birth: 04-12-1946  Transition of Care [facility]) CM/SW Contact:  Hoy DELENA Bigness, LCSW Phone Number: 07/05/2024, 1:13 PM   Clinical Narrative:    Pt to discharge to [facility] for LTC. Pt to complete course of IV abx tx at facility ending on 08/08/24. Pt will be going to room 805. RN to call report to [phone]. [facility] to follow pt at discharge to provide outpatient palliative care. VM left for pt's daughter to inform of discharge. [facility] called at 2:45pm for transportation.    Final next level of care: Skilled Nursing Facility Barriers to Discharge: Barriers Resolved   Patient Goals and CMS Choice Patient states their goals for this hospitalization and ongoing recovery are:: go to SNF CMS Medicare.gov Compare Post Acute Care list provided to:: Patient Choice offered to / list presented to : Patient, Adult Children [place] ownership interest in [facility].provided to::  (n/a)    Discharge Placement   Existing PASRR number confirmed : 07/05/24          Patient chooses bed at: [facility] [place] Patient to be transferred to facility by: EMS Name of family member notified: Daughter Patient and family notified of of transfer: 07/05/24  Discharge Plan and Services Additional resources added to the After Visit Summary for   In-house Referral: Clinical Social Work   Post Acute Care Choice: Resumption of Svcs/PTA Provider          DME Arranged: N/A DME Agency: NA                  Social Drivers of Health (SDOH) Interventions SDOH Screenings   Food Insecurity: No Food Insecurity (06/25/2024)  Housing: Low Risk  (06/25/2024)  Transportation Needs: No Transportation Needs (06/25/2024)  Utilities: Not At Risk (06/25/2024)  Alcohol Screen: Low Risk  (07/08/2021)  Depression (PHQ2-9): Low Risk  (04/14/2023)  Financial Resource Strain: Low  Risk  (07/20/2022)  Physical Activity: Insufficiently Active (07/20/2022)  Social Connections: Moderately Integrated (06/25/2024)  Stress: No Stress Concern Present (07/20/2022)  Tobacco Use: Medium Risk (07/02/2024)     Readmission Risk Interventions    07/02/2024   10:49 AM 06/29/2024   12:51 PM 06/29/2024   12:45 PM  Readmission Risk Prevention Plan  Transportation Screening Complete Complete Complete  PCP or Specialist Appt within 5-7 Days Complete    Home Care Screening Complete Complete Complete  Medication Review (RN CM) Complete Complete Complete

## 2024-07-05 NOTE — Progress Notes (Signed)
 PHARMACY - ANTICOAGULATION CONSULT NOTE  Pharmacy Consult for heparin  Indication: atrial fibrillation  Allergies  Allergen Reactions   Shellfish Allergy Shortness Of Breath, Rash and Other (See Comments)    GI upset Difficulty breathing  Shrimp and crab   Celexa  [Citalopram ] Other (See Comments)    Aggression    Levsin [Hyoscyamine] Other (See Comments)    Urinary retention   Librax [Chlordiazepoxide-Clidinium] Other (See Comments)    Urinary retention   Ak-Mycin [Erythromycin] Other (See Comments)    GI upset   Mobic  [Meloxicam ] Other (See Comments)    GI upset   Penicillins Itching, Rash and Dermatitis    Received Unasyn  earlier without acute reaction    Patient Measurements: Height: 5' 7.5 (171.5 cm) Weight: 71.3 kg (157 lb 3 oz) IBW/kg (Calculated) : 67.25 HEPARIN  DW (KG): 76.4  Vital Signs: Temp: 97.8 F (36.6 C) (11/14 0350) BP: 126/72 (11/14 0350) Pulse Rate: 56 (11/14 0350)  Labs: Recent Labs    07/03/24 0358 07/03/24 1309 07/04/24 0849 07/04/24 1416  HGB 15.8  --  16.3  --   HCT 49.1  --  51.4  --   PLT 488*  --  476*  --   APTT 61* 79* >200* 83*  HEPARINUNFRC >1.10*  --  0.97*  --   CREATININE 0.69  --  0.79  --   CKTOTAL <10*  --   --   --     Estimated Creatinine Clearance: 71.3 mL/min (by C-G formula based on SCr of 0.79 mg/dL).   Medical History: Past Medical History:  Diagnosis Date   Actinic keratosis 04/02/2013   L sup scapula - bx proven   Actinic keratosis 04/02/2013   R med ant deltoid - bx proven   Allergy    Anal fissure    Anxiety    Basal cell carcinoma    back - treated in the past    Basal cell carcinoma 05/02/2009   left supratip, Mohs   Cancer (HCC)    basal cell removed x4   Cataract    bil eyes   Depression    Diverticulosis of colon    GERD (gastroesophageal reflux disease)    esophageal narrowing   Glaucoma    Hiatal hernia    IBS (irritable bowel syndrome)    Insomnia    Internal hemorrhoids      Assessment: Pharmacy consulted to dose heparin  in patient with atrial fibrillation.  He is on Eliquis  with last dose 11/11 @ 0835.  Patient is now needing endoscopy so holding Eliquis  and starting heparin  infusion. Will need to dose based on aPTT until correlation with heparin  levels.   Aptt still in process, however, heparin  level is now at goal 0.4. No further procedures are planned.   Plan for PICC today for outpatient abx then transition back to apixaban  at discharge.  Goal of Therapy:  Heparin  level 0.3-0.7 units/ml aPTT 66-102 seconds Monitor platelets by anticoagulation protocol: Yes   Plan:  Continue heparin  infusion at 1200 units/hr Check aPTT level daily while on heparin  Continue to monitor H&H and platelets  Dempsey Blush PharmD., BCPS Clinical Pharmacist 07/05/2024 7:51 AM

## 2024-07-08 LAB — CULTURE, BLOOD (ROUTINE X 2)

## 2024-07-10 ENCOUNTER — Ambulatory Visit: Payer: Self-pay | Admitting: Family Medicine

## 2024-07-16 ENCOUNTER — Inpatient Hospital Stay: Admitting: Infectious Diseases

## 2024-08-06 ENCOUNTER — Telehealth: Payer: Self-pay

## 2024-08-06 ENCOUNTER — Other Ambulatory Visit: Payer: Self-pay

## 2024-08-06 ENCOUNTER — Ambulatory Visit (INDEPENDENT_AMBULATORY_CARE_PROVIDER_SITE_OTHER): Admitting: Infectious Diseases

## 2024-08-06 ENCOUNTER — Encounter: Payer: Self-pay | Admitting: Infectious Diseases

## 2024-08-06 VITALS — BP 135/92 | HR 112 | Temp 98.4°F

## 2024-08-06 DIAGNOSIS — Z5181 Encounter for therapeutic drug level monitoring: Secondary | ICD-10-CM | POA: Insufficient documentation

## 2024-08-06 DIAGNOSIS — R7881 Bacteremia: Secondary | ICD-10-CM

## 2024-08-06 DIAGNOSIS — B9562 Methicillin resistant Staphylococcus aureus infection as the cause of diseases classified elsewhere: Secondary | ICD-10-CM

## 2024-08-06 DIAGNOSIS — Z452 Encounter for adjustment and management of vascular access device: Secondary | ICD-10-CM | POA: Insufficient documentation

## 2024-08-06 LAB — CBC
HCT: 54.7 % — ABNORMAL HIGH (ref 39.4–51.1)
Hemoglobin: 17.4 g/dL — ABNORMAL HIGH (ref 13.2–17.1)
MCH: 25.9 pg — ABNORMAL LOW (ref 27.0–33.0)
MCHC: 31.8 g/dL (ref 31.6–35.4)
MCV: 81.3 fL — ABNORMAL LOW (ref 81.4–101.7)
MPV: 9.9 fL (ref 7.5–12.5)
Platelets: 571 Thousand/uL — ABNORMAL HIGH (ref 140–400)
RBC: 6.73 Million/uL — ABNORMAL HIGH (ref 4.20–5.80)
RDW: 18.6 % — ABNORMAL HIGH (ref 11.0–15.0)
WBC: 13.6 Thousand/uL — ABNORMAL HIGH (ref 3.8–10.8)

## 2024-08-06 LAB — COMPREHENSIVE METABOLIC PANEL WITH GFR
AG Ratio: 1 (calc) (ref 1.0–2.5)
ALT: 10 U/L (ref 9–46)
AST: 12 U/L (ref 10–35)
Albumin: 3.9 g/dL (ref 3.6–5.1)
Alkaline phosphatase (APISO): 107 U/L (ref 35–144)
BUN: 14 mg/dL (ref 7–25)
CO2: 25 mmol/L (ref 20–32)
Calcium: 9.5 mg/dL (ref 8.6–10.3)
Chloride: 101 mmol/L (ref 98–110)
Creat: 0.8 mg/dL (ref 0.70–1.28)
Globulin: 4.1 g/dL — ABNORMAL HIGH (ref 1.9–3.7)
Glucose, Bld: 84 mg/dL (ref 65–99)
Potassium: 4.7 mmol/L (ref 3.5–5.3)
Sodium: 135 mmol/L (ref 135–146)
Total Bilirubin: 0.3 mg/dL (ref 0.2–1.2)
Total Protein: 8 g/dL (ref 6.1–8.1)
eGFR: 90 mL/min/1.73m2 (ref >=60)

## 2024-08-06 LAB — CK: Total CK: 21 U/L (ref 19–278)

## 2024-08-06 NOTE — Telephone Encounter (Signed)
 Per Dr. Dea end date to stop IV abx and pull picc is on 12/18. Sent orders on orders form to [facility]. Labs drawn today in clinic.

## 2024-08-06 NOTE — Progress Notes (Signed)
 Reason for Fu: MRSA bacteremia   Patient Active Problem List   Diagnosis Date Noted   MRSA bacteremia 07/04/2024   Severe sepsis (HCC) 06/25/2024   UTI (urinary tract infection) 06/25/2024   PNA (pneumonia) 06/25/2024   AKI (acute kidney injury) 06/25/2024   Essential hypertension 05/12/2024   History of urinary retention 10/30/2023   History of elevated PSA 10/30/2023   Weakness of right side of body 06/05/2023   History of hemorrhagic stroke with residual hemiplegia (HCC) 05/30/2023   Glaucoma 05/30/2023   Former smoker 05/30/2023   Ambulatory dysfunction 05/30/2023   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   Atrial fibrillation (HCC) 09/29/2019   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/18/2019   Sepsis (HCC) 09/18/2019   Degenerative disc disease, cervical 01/23/2019   Skin irritation 10/28/2018   Encounter for diagnostic endoscopy 06/25/2017   Low HDL (under 40) 05/24/2016   BPH (benign prostatic hyperplasia) 12/10/2013   Erectile dysfunction 08/26/2013   GERD (gastroesophageal reflux disease) 06/28/2013   Depression 04/01/2011   Anxiety state 12/15/2007   PEPTIC STRICTURE 12/15/2007   Irritable bowel syndrome 12/23/2006    Patient's Medications  New Prescriptions   No medications on file  Previous Medications   BACLOFEN  5 MG TABS    Take 1 tablet by mouth 3 (three) times daily.   CETIRIZINE (ZYRTEC) 10 MG TABLET    Take 10 mg by mouth daily.   DAPTOMYCIN  (CUBICIN ) IVPB    Inject 600 mg into the vein daily. Indication:  MRSA Bacteremia First Dose: Yes Last Day of Therapy: 08/08/24 Labs - Once weekly:  CBC/D, BMP, and CPK Labs - Once weekly: ESR and CRP Method of administration: IV Push or Per SNF Protocol (IV Infusion) Method of administration may be changed at the discretion of home infusion pharmacist based upon assessment of the patient and/or caregiver's ability to self-administer the medication ordered.   DILTIAZEM  (CARDIZEM  CD) 120 MG 24 HR CAPSULE    Take 1 capsule (120 mg  total) by mouth daily.   ELIQUIS  2.5 MG TABS TABLET    Take 2.5 mg by mouth 2 (two) times daily. 0800, 2000   LACTOSE FREE NUTRITION (BOOST) LIQD    Take 240 mLs by mouth 3 (three) times daily between meals.   LATANOPROST  (XALATAN ) 0.005 % OPHTHALMIC SOLUTION    Place 1 drop into both eyes at bedtime.   MELATONIN 3 MG TABS TABLET    Take 1 tablet (3 mg total) by mouth at bedtime as needed.   METHOCARBAMOL  (ROBAXIN ) 500 MG TABLET    Take 1 tablet (500 mg total) by mouth every 8 (eight) hours as needed for muscle spasms.   NYSTATIN  OINTMENT (MYCOSTATIN )    Apply 1 Application topically every 12 (twelve) hours as needed (chronic fungal rash). Apply to glans penis/scrotum   POLYETHYLENE GLYCOL (MIRALAX  / GLYCOLAX ) 17 G PACKET    Take 17 g by mouth 2 (two) times daily.   PREGABALIN  (LYRICA ) 50 MG CAPSULE    Take 50 mg by mouth 2 (two) times daily.   PROCTOZONE -HC 2.5 % RECTAL CREAM    Place 1 Application rectally every 6 (six) hours as needed for hemorrhoids (after bowel movements).   SENNA-DOCUSATE (SENOKOT-S) 8.6-50 MG TABLET    Take 1 tablet by mouth daily.   SERTRALINE  (ZOLOFT ) 25 MG TABLET    Take 25 mg by mouth daily.   TAMSULOSIN  (FLOMAX ) 0.4 MG CAPS CAPSULE    Take 1 capsule (0.4 mg total) by mouth daily after supper.  TIMOLOL  (TIMOPTIC ) 0.25 % OPHTHALMIC SOLUTION    Place 1 drop into both eyes daily.  Modified Medications   No medications on file  Discontinued Medications   No medications on file    Subjective: Discussed the use of AI scribe software for clinical note transcription with the patient, who gave verbal consent to proceed.   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    12/16 Coming from SNF today. MAR reviewed, getting IV daptomycin  through 12/18, also got ciprofloxacin  500 mg po bid from 12/1 for 5 days for UTI. No issues with PICC line and IV antibiotics. SNF did not sent labs and called them, they say they did not get order for weekly labs. No other concerns.   Review of Systems: all systems reviewed with pertinent positives and negatives as listed above   Past Medical History:  Diagnosis Date   Actinic keratosis 04/02/2013   L sup scapula - bx proven   Actinic keratosis 04/02/2013   R med ant deltoid - bx proven   Allergy    Anal fissure    Anxiety    Basal cell carcinoma    back - treated in the past    Basal cell carcinoma 05/02/2009   left supratip, Mohs   Cancer (HCC)    basal cell removed x4   Cataract    bil eyes   Depression    Diverticulosis of colon    GERD (gastroesophageal reflux disease)    esophageal narrowing   Glaucoma    Hiatal hernia    IBS (irritable bowel syndrome)    Insomnia    Internal hemorrhoids    Past Surgical History:  Procedure Laterality Date   ANAL SPHINCTEROTOMY  03/1995   fistulotomy   APPENDECTOMY     BASAL CELL CARCINOMA EXCISION     on back   BICEPT TENODESIS Right 09/27/2004   decompression   COLONOSCOPY  04/1999   internal hemmroids,12-11-08 colon tics and hems only    ESOPHAGOGASTRODUODENOSCOPY  04/1999   with esophagitis and stricture   MOHS SURGERY  09/02/2008   L supratip of nose with flap,basal cell cancer [facility]/MOHS   MOUTH SURGERY  01/2020   TEE WITHOUT CARDIOVERSION N/A 07/02/2024   Procedure: ECHOCARDIOGRAM, TRANSESOPHAGEAL;  Surgeon: Stacia Diannah SQUIBB, MD;  Location: [facility];  Service: Cardiovascular;  Laterality: N/A;   TONGUE SURGERY  2017   had small place removed, noncancerous   TONSILLECTOMY AND ADENOIDECTOMY     remote   UPPER GASTROINTESTINAL ENDOSCOPY     WRIST SURGERY Right    WRIST SURGERY     scar tissue removed    Social History[1]  Family History   Problem Relation Age of Onset   Hypertension Mother    Stroke Mother    Pneumonia Father    Bladder Cancer Father    Cancer Maternal Aunt        ? type   Breast cancer Maternal Aunt        mets   Esophageal cancer Maternal Aunt        aunt ? esophageal ca   Stomach cancer Maternal Aunt        aunt ? stmach ca   Colon cancer Maternal Aunt        had colostomy -  not sure of origin   Cancer Maternal Grandmother        ? type   Prostate cancer Neg Hx    Rectal cancer Neg Hx     Allergies[2]  Health Maintenance  Topic Date Due   Medicare Annual Wellness (AWV)  05/29/2024   DTaP/Tdap/Td (4 - Td or Tdap) 06/03/2032   Pneumococcal Vaccine: 50+ Years  Completed   Influenza Vaccine  Completed   Hepatitis C Screening  Completed   Zoster Vaccines- Shingrix  Completed   Meningococcal B Vaccine  Aged Out   Colonoscopy  Discontinued   COVID-19 Vaccine  Discontinued    Objective: BP (!) 135/92   Pulse (!) 112   Temp 98.4 F (36.9 C) (Temporal)   SpO2 91%   Physical Exam Constitutional:      Appearance: Normal appearance.  HENT:     Head: Normocephalic and atraumatic.      Mouth: Mucous membranes are moist.  Eyes:    Conjunctiva/sclera: Conjunctivae normal.     Pupils: Pupils are equal, round, and b/l symmetrical    Cardiovascular:     Rate and Rhythm: Normal rate     Heart sounds: S1S2  Pulmonary:     Effort: Pulmonary effort is normal.     Breath sounds: Normal breath sounds.   Abdominal:     General: Non distended     Palpations: soft.   Musculoskeletal:        General: sitting in the wheel chair, wheel chair ridden due to rt sided paresis.   Skin:    General: Skin is warm and dry.     Comments: Left arm PICC Okay with no signs of infection   Neurological:     General: grossly non focal     Mental Status: awake, alert and oriented to person, place, and time.   Psychiatric:        Mood and Affect: Mood normal.   Lab Results Lab Results  Component  Value Date   WBC 13.2 (H) 07/05/2024   HGB 15.1 07/05/2024   HCT 47.7 07/05/2024   MCV 82.5 07/05/2024   PLT 494 (H) 07/05/2024    Lab Results  Component Value Date   CREATININE 0.81 07/05/2024   BUN 14 07/04/2024   NA 137 07/04/2024   K 4.1 07/04/2024   CL 101 07/04/2024   CO2 28 07/04/2024    Lab Results  Component Value Date   ALT 40 06/25/2024   AST 25 06/25/2024   ALKPHOS 116 06/25/2024   BILITOT 0.7 06/25/2024    Lab Results  Component Value Date   CHOL 125 04/27/2023   HDL 34 (L) 04/27/2023   LDLCALC 59 04/27/2023   TRIG 161 (H) 04/27/2023   CHOLHDL 3.7 04/27/2023   No results found for: LABRPR, RPRTITER No results found for: HIV1RNAQUANT, HIV1RNAVL, CD4TABS  Assessment/Plan 78 year old male with prior history as above including BCC, HTN, IBS, anxiety/depression, GERD, BPH, A-fib, 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  Here for HFU for    # MRSA bacteremia, unable to rule out endocarditis -Unclear cause questionable UTI -No known wounds, cellulitis, hardware or joint or back pain - Patient declined EGD as well as TEE for ruling out endocarditis plan to treat for presumptive endocarditis - Adherence assessed, side effects reviewed/discussed and DDIs reviewed   Plan -Complete course of daptomycin   through 12/18 -Pull PICC after last dose of daptomycin  on 12/18 -Follow-up as needed   # Medication monitoring -SNF tells today they have not received orders for weekly labs -CBC, CMP and CPK today    # BPH/urinary retention s/p indwelling Foley's - Fu with Urology    I spent 30 minutes involved in face-to-face and non-face-to-face activities for this patient on the day of the visit. Professional time spent includes the following activities: Preparing to see the patient (review of  tests), Obtaining and reviewing separately obtained history discharge record 07/05/24), Performing a medically appropriate examination and evaluation, Ordering medications/labs, referring and communicating with other health care professionals including SNf, Documenting clinical information in the EMR, Independently interpreting results (not separately reported), Communicating results to the patient, Counseling and educating the patient and Care coordination (not separately reported).   Of note, portions of this note may have been created with voice recognition software. While this note has been edited for accuracy, occasional wrong-word or sound-a-like substitutions may have occurred due to the inherent limitations of voice recognition software.   Annalee Joseph, MD [facility] 08/06/2024, 1:35 PM     [1]  Social History Tobacco Use   Smoking status: Former    Current packs/day: 0.00    Types: Cigarettes, Cigars    Quit date: 08/22/1998    Years since quitting: 25.9   Smokeless tobacco: Never   Tobacco comments:    cigarettes 1968, cigars 2000  Vaping Use   Vaping status: Never Used  Substance Use Topics   Alcohol use: Not Currently    Alcohol/week: 0.0 standard drinks of alcohol    Comment: rare   Drug use: No  [2]  Allergies Allergen Reactions   Shellfish Allergy Shortness Of Breath, Rash and Other (See Comments)    GI upset Difficulty breathing  Shrimp and crab   Celexa  [Citalopram ] Other (See Comments)    Aggression    Levsin [Hyoscyamine] Other (See Comments)    Urinary retention   Librax [Chlordiazepoxide-Clidinium] Other (See Comments)    Urinary retention   Ak-Mycin [Erythromycin] Other (See Comments)    GI upset   Mobic  [Meloxicam ] Other (See Comments)    GI upset   Penicillins Itching, Rash and Dermatitis    Received Unasyn  earlier without acute reaction

## 2024-08-12 ENCOUNTER — Ambulatory Visit: Payer: Self-pay | Admitting: Infectious Diseases

## 2024-08-28 ENCOUNTER — Ambulatory Visit: Admitting: Urology

## 2024-10-10 ENCOUNTER — Ambulatory Visit: Payer: Self-pay | Admitting: Infectious Diseases

## 2025-01-15 ENCOUNTER — Ambulatory Visit: Admitting: Dermatology
